# Patient Record
Sex: Female | Born: 1940 | Race: White | Hispanic: No | Marital: Married | Smoking: Never smoker
Health system: Southern US, Community
[De-identification: ages and names within clinical notes are randomized; demographics above are authoritative.]

## PROBLEM LIST (undated history)

## (undated) DIAGNOSIS — K589 Irritable bowel syndrome without diarrhea: Secondary | ICD-10-CM

## (undated) DIAGNOSIS — D689 Coagulation defect, unspecified: Secondary | ICD-10-CM

## (undated) DIAGNOSIS — I10 Essential (primary) hypertension: Secondary | ICD-10-CM

## (undated) DIAGNOSIS — E119 Type 2 diabetes mellitus without complications: Secondary | ICD-10-CM

## (undated) DIAGNOSIS — C801 Malignant (primary) neoplasm, unspecified: Secondary | ICD-10-CM

## (undated) DIAGNOSIS — T7840XA Allergy, unspecified, initial encounter: Secondary | ICD-10-CM

## (undated) DIAGNOSIS — F419 Anxiety disorder, unspecified: Secondary | ICD-10-CM

## (undated) DIAGNOSIS — M1990 Unspecified osteoarthritis, unspecified site: Secondary | ICD-10-CM

## (undated) HISTORY — DX: Anxiety disorder, unspecified: F41.9

## (undated) HISTORY — DX: Irritable bowel syndrome, unspecified: K58.9

## (undated) HISTORY — DX: Essential (primary) hypertension: I10

## (undated) HISTORY — DX: Coagulation defect, unspecified: D68.9

## (undated) HISTORY — DX: Type 2 diabetes mellitus without complications: E11.9

## (undated) HISTORY — PX: BREAST SURGERY: SHX581

## (undated) HISTORY — DX: Malignant (primary) neoplasm, unspecified: C80.1

## (undated) HISTORY — PX: EYE SURGERY: SHX253

## (undated) HISTORY — DX: Allergy, unspecified, initial encounter: T78.40XA

## (undated) HISTORY — DX: Unspecified osteoarthritis, unspecified site: M19.90

---

## 1983-07-11 HISTORY — PX: VAGINAL HYSTERECTOMY: SUR661

## 1994-07-10 HISTORY — PX: BREAST LUMPECTOMY: SHX2

## 1999-01-07 ENCOUNTER — Other Ambulatory Visit: Admission: RE | Admit: 1999-01-07 | Discharge: 1999-01-07 | Payer: Self-pay | Admitting: *Deleted

## 2000-07-10 HISTORY — PX: MASTECTOMY: SHX3

## 2001-01-02 ENCOUNTER — Encounter: Payer: Self-pay | Admitting: *Deleted

## 2001-01-02 ENCOUNTER — Encounter: Admission: RE | Admit: 2001-01-02 | Discharge: 2001-01-02 | Payer: Self-pay | Admitting: *Deleted

## 2001-04-30 ENCOUNTER — Other Ambulatory Visit: Admission: RE | Admit: 2001-04-30 | Discharge: 2001-04-30 | Payer: Self-pay | Admitting: Radiology

## 2001-05-06 ENCOUNTER — Ambulatory Visit (HOSPITAL_COMMUNITY): Admission: RE | Admit: 2001-05-06 | Discharge: 2001-05-06 | Payer: Self-pay | Admitting: General Surgery

## 2001-05-08 ENCOUNTER — Encounter (INDEPENDENT_AMBULATORY_CARE_PROVIDER_SITE_OTHER): Payer: Self-pay | Admitting: Specialist

## 2001-05-09 ENCOUNTER — Inpatient Hospital Stay (HOSPITAL_COMMUNITY): Admission: EM | Admit: 2001-05-09 | Discharge: 2001-05-10 | Payer: Self-pay | Admitting: General Surgery

## 2002-03-17 ENCOUNTER — Ambulatory Visit (HOSPITAL_COMMUNITY): Admission: RE | Admit: 2002-03-17 | Discharge: 2002-03-17 | Payer: Self-pay | Admitting: Gastroenterology

## 2002-03-17 ENCOUNTER — Encounter (INDEPENDENT_AMBULATORY_CARE_PROVIDER_SITE_OTHER): Payer: Self-pay

## 2003-03-23 ENCOUNTER — Ambulatory Visit (HOSPITAL_COMMUNITY): Admission: RE | Admit: 2003-03-23 | Discharge: 2003-03-23 | Payer: Self-pay | Admitting: Gastroenterology

## 2003-04-09 ENCOUNTER — Other Ambulatory Visit: Admission: RE | Admit: 2003-04-09 | Discharge: 2003-04-09 | Payer: Self-pay | Admitting: *Deleted

## 2003-12-30 ENCOUNTER — Other Ambulatory Visit: Admission: RE | Admit: 2003-12-30 | Discharge: 2003-12-30 | Payer: Self-pay | Admitting: Family Medicine

## 2007-07-19 ENCOUNTER — Ambulatory Visit: Payer: Self-pay | Admitting: Cardiology

## 2010-05-18 ENCOUNTER — Encounter: Admission: RE | Admit: 2010-05-18 | Discharge: 2010-05-18 | Payer: Self-pay | Admitting: Family Medicine

## 2010-11-22 NOTE — Letter (Signed)
July 19, 2007    Talmadge Coventry, M.D.  [address]  [place], [place] [address]   RE:  [name] [name], [name] [name]  MRN:  [id number]  /  DOB:  06/09/1941   Dear Dr. Smith Mince:   Thank you for your referral of Ms. Gilardi.  As you know, she is a  pleasant 70 year old woman with a history of hypertension, breast cancer  status post bilateral mastectomies, and a fairly longstanding but  intermittent history of palpitations.  She states that she saw Dr.  Juanda Chance back in her 67s and had an echocardiogram suggesting possibly  mitral valve prolapse although it sounds as that this was a fairly  equivocal diagnosis.  She has been treated with beta blockers since that  time with intermittent palpitations, although never any prolonged  symptoms or syncope.  She states that she is heterozygous for Factor V  Leiden deficiency has had no obvious thromboembolic complications.  She  carries a diagnosis of premature atrial complexes, I presume based on  standard electrocardiograms, as she has not had any prior cardiac  telemetry monitoring.  She reports since December having an increased  feeling of chest flutters, some irregularity to her heart beat, and  just a general sense at times that she is aware of her heartbeat.  She  has had her Toprol XL advanced now up to 200 mg daily and reports that  her symptoms have completely resolved.  She is not having any chest pain  or breathlessness.   MS. Massey REPORTS AN ALLERGY TO SULFA DRUGS.   She is taking presently:  1. Azithromycin.  2. Toprol XL 10 mg daily.  3. Maxzide.  4. BuSpar 30 mg p.o. b.i.d.  5. Allegra.  6. Allergy shots.  7. Restasis.  8. Estrace vaginal cream p.r.n.  9. Diazepam 2.5 mg daily.   PAST MEDICAL HISTORY:  Is discussed above.  The patient underwent a  lumpectomy with diagnosis of breast cancer in 1996 followed by bilateral  mastectomies in 2002.  Also had a prior hysterectomy in 1984.  No  personal history of  cardiovascular disease or myocardial infarction.   Ms. Scollard is married.  She has two children.  She is retired.  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  She  denies any tobacco use.  Drinks alcohol rarely.  She has been doing some  walking for exercise.  Drinks occasional caffeinated beverage.   REVIEW OF SYSTEMS:  Is as described above.  She does have seasonal  allergies.  Reportedly has a hiatal hernia.  Does have some arthritic  pain occasionally, mild anxiety at times, previous history of cancer.  Otherwise negative.   FAMILY HISTORY:  Was reviewed.  The patient stated that her son is  homozygous for Factor V Leiden deficiency.  He also has a patent  foraminal valley.  The patient stated her father died at age 34, having  developed heart disease in his 5s.  Her mother died at age 35, also  with heart disease.   EXAMINATION:  Ms. Zaragoza is in no acute distress, weighing 180  pounds, blood pressure 138/84, heart rate 90-100 and regular at rest.  HEENT:  Conjunctiva lids are normal.  Oropharynx is clear.  NECK:  Is supple.  No elevated jugular venous pressure.  No loud bruits.  No thyromegaly is noted.  LUNGS:  Are clear without labored breathing.  CARDIAC EXAM:  Reveals a regular rate rhythm with soft S4.  No loud  murmur.  Second heart sound is not split.  No pericardial rub.  ABDOMEN:  Soft, nontender, normoactive bowel sounds.  EXTREMITIES:  Exhibit no pitting edema.  Distal pulses are 2+.  SKIN:  Warm and dry.  MUSCULOSKELETAL:  No kyphosis noted.  NEURO/PSYCHIATRIC:  Patient alert and oriented x3.  Affect is normal.   Today's electrocardiogram shows normal sinus rhythm at 94 beats per  minute with normal intervals.   IMPRESSION AND RECOMMENDATIONS:  Ms. Buege is a pleasant 70 year old  woman with an intermittent yet longstanding history of palpitations  described as a fluttering and also irregularity with general awareness  of her  heartbeat.  She has had no frank dizziness or syncope associated  with this and does not describing any exertional chest pain or  breathlessness.  She has apparently had premature atrial complexes  already documented.  Her electrocardiogram today is normal.  She reports  resolution of the symptoms after increasing Toprol XL to 200 mg daily.  We talked about a number of options today and have elected simple  observation at this point.  If she manifests progressive symptoms,  however, I would suggest an echocardiogram with bubble study to exclude  any structural defects and also an event recorder to better document her  palpitations.  She was in agreement with this, assuming her symptoms  progressed, and otherwise, I will plan to see her back for a follow-up  in 6 months.    Sincerely,      Jonelle Sidle, MD  Electronically Signed    SGM/MedQ  DD: 07/19/2007  DT: 07/19/2007  Job #: [id number]

## 2010-11-25 NOTE — Op Note (Signed)
   [name] [name], [name] [name]                      ACCOUNT NO.:  [id number]   MEDICAL RECORD NO.:  [id number]                   PATIENT TYPE:  AMB   LOCATION:  ENDO                                 FACILITY:  [facility]   PHYSICIAN:  Barrie Folk, M.D.                  DATE OF BIRTH:  [birth year]   DATE OF PROCEDURE:  DATE OF DISCHARGE:                                 OPERATIVE REPORT   PROCEDURE:  Colonoscopy with polypectomy.   INDICATIONS FOR PROCEDURE:  Screening colonoscopy in a patient with a  personal history of breast cancer.   DESCRIPTION OF PROCEDURE:  The patient was placed in the left lateral  decubitus position then placed on the pulse monitor with continuous low flow  oxygen delivered by nasal cannula. She was sedated with 70 mg IV Demerol and  7 mg IV Versed. The Olympus video colonoscope was inserted into the rectum  and advanced to the cecum, confirmed by transillumination at McBurney's  point and visualization of the ileocecal valve and appendiceal orifice. The  prep was excellent. The cecum revealed a small sessile polyp approximately 7  mm in diameter and this was fulgurated by hot biopsy. The remainder of the  cecum, ascending, and transverse colon appeared normal with no further  polyps, masses, diverticula or other mucosal abnormalities. Within the  sigmoid colon there were seen several diverticula and no other  abnormalities. The rectum  appeared normal and retroflexed view of the anus  revealed no obvious internal hemorrhoids. The colonoscope was then withdrawn  and the patient returned to the recovery room in stable condition. The  patient tolerated the procedure well and there were no immediate  complications.   IMPRESSION:  1. Cecal polyp.  2. Sigmoid diverticulosis.   PLAN:  Await histology for determination of method and interval for future  colon screening.                                               Barrie Folk, M.D.    JCH/MEDQ  D:  03/17/2002   T:  03/17/2002  Job:  [id number]   cc:   Lynett Fish, M.D.  [address] [place]  [place] [address]  Fax: [phone]

## 2010-11-25 NOTE — Op Note (Signed)
[place]  Patient:    [name] [name], [name] [name] Visit Number: [id number] MRN: [id number]          Service Type: SUR Location: 4W 0448 01 Attending Physician:  Brandy Hale Dictated by:   Angelia Mould. Derrell Lolling, M.D. Proc. Date: 05/09/01 Admit Date:  05/09/2001   CC:         Valentino Hue. Magrinat, M.D.  Lynett Fish, M.D.  Jeralyn Ruths, M.D.  Heather Roberts, M.D.   Operative Report  PREOPERATIVE DIAGNOSIS:  Recurrent carcinoma of left breast.  POSTOPERATIVE DIAGNOSIS:  Recurrent carcinoma of left breast.  OPERATION PERFORMED: 1. Bilateral total mastectomy. 2. Injection of blue dye, left breast with attempted sentinel node mapping.  SURGEON:  Angelia Mould. Derrell Lolling, M.D.  ASSISTANT:  Lorne Skeens. Hoxworth, M.D.  ANESTHESIA:  INDICATIONS FOR PROCEDURE:  The patient is a 70 year old white female who underwent left partial mastectomy and left axillary lymph node dissection by me in 1996.  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  The tumor was 2.0 cm in diameter.  Lymph nodes were negative.  She received radiation therapy and Tamoxifen for five years and she did well.  Recent mammogram showed a new 8 mm punctate cluster of linear calcifications in the upper outer quadrant of the left breast which is in the same quadrant as the lumpectomy scar but is distinctly separate from the lumpectomy scar.  Dr. Jeralyn Ruths performed a stereotactic core biopsy on April 30, 2001.  This showed invasive mammary carcinoma and ductal carcinoma in situ.  Hormone receptors had been requested. The patient was seen by me on May 02, 2001.  Left mastectomy was recommended.  It was her strong desire to have a prophylactic right mastectomy.  Her care was discussed with Dr. Marikay Alar Magrinat and Dr. Margo Common. Dr. Cleone Slim requested that we attempt a sentinel node mapping and biopsy  on the left side which I agreed to do although I feel that the lymphatic branch has probably been disrupted.  We discussed immediate reconstruction and the patient did not want to consider that at this time.  She was brought to the operating room electively.  DESCRIPTION OF PROCEDURE:  The patient underwent injection of radioactive nuclide in the left breast two hours preop.  She was then brought to the operating room.  General endotracheal anesthesia was induced.  Both breasts, chest wall, axilla and shoulders were prepped and draped in sterile fashion. Lymphazurin blue dye was injected in periareolar tissues on the left in four quadrants and was massaged for three to five minutes.  The Neoprobe was used and the lateral breast and the axilla were completely cold. We did not find any area of increased activity.  The left mastectomy was performed first.  A transverse elliptical incision was made to incorporate the nipple and areola and the transverse scar laterally in the left breast where she had a previous lumpectomy.  Skin flap were raised superiorly, medially, inferiorly and laterally and all the breast tissue was dissected away from the underlying subcutaneous fat.  The breast tissue and the pectoralis fascia were then dissected away from the pectoralis major muscle, leaving the entire pectoralis muscle intact.  We took the specimen as far laterally as the latissimus dorsi muscle.  We again used the Neoprobe and did not find any area of increased activity in the lateral aspect of the breast tissue, nor did we find any  evidence of any radioactivity in the axilla.  This specimen was sent to pathology and the prior history was discussed with Dr. Francoise Schaumann in the pathology department.  The wound was irrigated with saline.  Hemostasis was excellent and achieved with electrocautery.  Two 19 French Blake drains were placed in the wound, one up into the axilla and one across the skin flaps.   These were brought out through separate incisions inferolaterally.  The drains were sutured to the skin and connected to suction bulbs.  The skin was closed with skin staples.  Attention was then directed to the right breast.  We made a very conservative transverse elliptical incision which completely excised the nipple and areola. Skin flap were again raised medially, superiorly, laterally and inferiorly, dissecting all of the breast tissue away from the subcutaneous fat.  The breast and the underlying pectoralis fascia were dissected away from the pectoralis major muscle leaving the pectoralis major muscle intact.  We took the entire breast and the tail of Spence away.  Probably a few lymph nodes were involved in the lateral aspect of the specimen but not many.  This specimen was sent for routine histology.  The history was discussed again with Dr. Francoise Schaumann.  This wound was also irrigated with saline.  Hemostasis was excellent and achieved with electrocautery.  Two 19 French Blake drains were placed also, one laterally and one medially and these were brought out through separate stab incisions inferiorly, sutured to the skin with silk sutures, connected to suction bulbs.  The skin was closed with skin staples.  Both wounds were cleansed and dried, covered with Adaptic bandage, 4 x 4, bulky bandage and HypaFix tape.  The patient tolerated the procedure well and was taken to the recovery room in stable condition.  Estimated blood loss was about 300 cc.  Complications were none.  Sponge, needle and instrument counts were correct. Dictated by:   Angelia Mould. Derrell Lolling, M.D. Attending Physician:  Brandy Hale DD:  05/09/01 TD:  05/09/01 Job: [id number]

## 2010-11-25 NOTE — Op Note (Signed)
   NAME:  [name] [name], [name] [name]                       ACCOUNT NO.:  [id number]   MEDICAL RECORD NO.:  [id number]                   PATIENT TYPE:  AMB   LOCATION:  ENDO                                 FACILITY:  [facility]   PHYSICIAN:  John C. Madilyn Fireman, M.D.                 DATE OF BIRTH:  07-03-1941   DATE OF PROCEDURE:  03/23/2003  DATE OF DISCHARGE:                                 OPERATIVE REPORT   PROCEDURE:  Colonoscopy.   INDICATIONS FOR PROCEDURE:  Heme positive stool and history of adenomatous  colon polyps.   DESCRIPTION OF PROCEDURE:  The patient was placed in the left lateral  decubitus position then placed on the pulse monitor with continuous low flow  oxygen delivered by nasal cannula. She was sedated with 100 mcg IV fentanyl  and 10 mg IV Versed. The Olympus video colonoscope was inserted into the  rectum and advanced to the cecum, confirmed by transillumination at  McBurney's point and visualization of the ileocecal valve and appendiceal  orifice. The prep was excellent. The cecum, ascending, transverse and  descending colon all appeared normal with no masses, polyps, diverticula or  other mucosal abnormalities. Within the sigmoid colon, there were still a  few scattered diverticula and no other abnormalities.  The rectum appeared  normal and retroflexed view of the anus revealed no obvious internal  hemorrhoids. The scope was then withdrawn and the patient returned to the  recovery room in stable condition. She tolerated the procedure well and  there were no immediate complications.   IMPRESSION:  Sigmoid diverticulosis otherwise normal study.   PLAN:  Repeat colonoscopy in five years based on her previous polyp.                                               John C. Madilyn Fireman, M.D.    JCH/MEDQ  D:  03/23/2003  T:  03/23/2003  Job:  [id number]   cc:   Dr. ______________________

## 2010-11-25 NOTE — Discharge Summary (Signed)
[facility]  Patient:    [name] [name], [name] [name] Visit Number: [id number] MRN: [id number]          Service Type: SUR Location: 4W 0448 01 Attending Physician:  Brandy Hale Dictated by:   Angelia Mould. Derrell Lolling, M.D. Admit Date:  05/09/2001 Discharge Date: 05/10/2001   CC:         Lynett Fish, M.D.  Valentino Hue. Magrinat, M.D.  Heather Roberts, M.D.   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  OPERATIONS PERFORMED:  Bilateral total mastectomy, injection of blue dye left breast, attempted left sentinel lymph node mapping and biopsy.  DATE OF SURGERY:  05/08/01.  HISTORY OF PRESENT ILLNESS:  This is a 70 year old white female who underwent left partial mastectomy, left axillary lymph node dissection in 1996.  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  Her posterior margin was involved and had to be re-resected, and we got a clean margin.  She had 16 lymph nodes, all of which were negative for cancer.  She received radiation therapy and tamoxifen for 5 years, and did well.  Recent mammogram showed a new, 8 mm punctate cluster of lineal calcifications in the upper outer quadrant of the left breast.  This is separate from the lumpectomy scar, but is in the same quadrant.  Jeralyn Ruths, M.D. performed a stereotactic core biopsy on April 30, 2001, and the pathology showed invasive mammary carcinoma and ductal carcinoma in situ.  Hormone receptors were sent, and although that report is not on the chart, I believe that her estrogen receptor was positive. The patient was evaluated as an outpatient.  Mammograms of the right breast were normal.  Examination of both breasts failed to reveal any palpable mass, skin change, or axillary mass.  The patient underwent lengthy discussions with me, as well as with Dr. Margo Common  preoperatively.  It was her strong desire to have bilateral mastectomy, and she was not interested in any reconstruction. Dr. Cleone Slim asked that we attempt a sentinel node biopsy on the left.  She was admitted for that procedure.  HOSPITAL COURSE:  On the day of admission the patient had her breast injected with radionuclide, and was brought to the operating room.  She underwent bilateral total mastectomy.  We injected blue dye into the left breast and attempted sentinel node mapping on the left side, but there was no radioactivity in the left axilla at any point, and so we did not identify any sentinel node.  Bilateral mastectomy was performed uneventfully.  Postoperatively, the patient did well.  She had mild to moderate drainage from her drains.  Her wounds looked good.  She felt enough to go home on postoperative day #2, May 10, 2001.  At that time she was ambulatory, tolerating her diet, her pain control was very good, her mastectomy wounds looked good, the dressings were clean, dry, and flat.  She was taught how to care for the Jackson-Pratt drains.  She was asked to return to see me in the office in 3 to 4 days.  She was given oxycodone and Advil for pain. Dictated by:   Angelia Mould. Derrell Lolling, M.D. Attending Physician:  Brandy Hale DD:  05/29/01 TD:  05/29/01 Job: [id number]

## 2010-12-09 ENCOUNTER — Encounter (INDEPENDENT_AMBULATORY_CARE_PROVIDER_SITE_OTHER): Payer: Self-pay | Admitting: General Surgery

## 2011-04-14 LAB — HM COLONOSCOPY

## 2011-07-13 LAB — HM DEXA SCAN

## 2011-09-01 ENCOUNTER — Other Ambulatory Visit: Payer: Self-pay | Admitting: Family Medicine

## 2011-09-03 ENCOUNTER — Other Ambulatory Visit: Payer: Self-pay | Admitting: Family Medicine

## 2011-09-07 ENCOUNTER — Telehealth: Payer: Self-pay

## 2011-09-07 NOTE — Telephone Encounter (Signed)
.  [facility] PT TO HAVE AN APPT WITH DR Hal Hope ON THE 4TH,  WAS GIVEN A CARD SAYING SHE IS TO HAVE BLOOD WORK DONE. DO SHE HAVE TO FAST PLEASE CALL [phone]

## 2011-09-07 NOTE — Telephone Encounter (Signed)
Advised pt that it would probably be best for her to be fasting in case the doctor wanted to check her cholesterol.

## 2011-09-09 ENCOUNTER — Encounter: Payer: Self-pay | Admitting: Family Medicine

## 2011-09-11 ENCOUNTER — Encounter: Payer: Self-pay | Admitting: Family Medicine

## 2011-09-11 ENCOUNTER — Ambulatory Visit (INDEPENDENT_AMBULATORY_CARE_PROVIDER_SITE_OTHER): Payer: Medicare Other | Admitting: Family Medicine

## 2011-09-11 DIAGNOSIS — I10 Essential (primary) hypertension: Secondary | ICD-10-CM

## 2011-09-11 DIAGNOSIS — M1990 Unspecified osteoarthritis, unspecified site: Secondary | ICD-10-CM

## 2011-09-11 DIAGNOSIS — E119 Type 2 diabetes mellitus without complications: Secondary | ICD-10-CM

## 2011-09-11 DIAGNOSIS — N189 Chronic kidney disease, unspecified: Secondary | ICD-10-CM

## 2011-09-11 DIAGNOSIS — M5136 Other intervertebral disc degeneration, lumbar region: Secondary | ICD-10-CM

## 2011-09-11 DIAGNOSIS — D6851 Activated protein C resistance: Secondary | ICD-10-CM

## 2011-09-11 DIAGNOSIS — M545 Low back pain, unspecified: Secondary | ICD-10-CM

## 2011-09-11 DIAGNOSIS — M8580 Other specified disorders of bone density and structure, unspecified site: Secondary | ICD-10-CM

## 2011-09-11 DIAGNOSIS — F419 Anxiety disorder, unspecified: Secondary | ICD-10-CM

## 2011-09-11 DIAGNOSIS — J301 Allergic rhinitis due to pollen: Secondary | ICD-10-CM

## 2011-09-11 DIAGNOSIS — E785 Hyperlipidemia, unspecified: Secondary | ICD-10-CM

## 2011-09-11 DIAGNOSIS — J302 Other seasonal allergic rhinitis: Secondary | ICD-10-CM

## 2011-09-11 DIAGNOSIS — Z86010 Personal history of colonic polyps: Secondary | ICD-10-CM

## 2011-09-11 DIAGNOSIS — C50919 Malignant neoplasm of unspecified site of unspecified female breast: Secondary | ICD-10-CM

## 2011-09-11 DIAGNOSIS — K219 Gastro-esophageal reflux disease without esophagitis: Secondary | ICD-10-CM

## 2011-09-11 LAB — POCT GLYCOSYLATED HEMOGLOBIN (HGB A1C): Hemoglobin A1C: 5.7

## 2011-09-11 MED ORDER — METOPROLOL SUCCINATE ER 100 MG PO TB24: ORAL_TABLET | ORAL | Status: DC

## 2011-09-11 MED ORDER — METFORMIN HCL ER 500 MG PO TB24: 500.0000 mg | ORAL_TABLET | Freq: Every day | ORAL | Status: DC

## 2011-09-11 MED ORDER — MONTELUKAST SODIUM 10 MG PO TABS: 10.0000 mg | ORAL_TABLET | Freq: Every day | ORAL | Status: DC

## 2011-09-11 NOTE — Progress Notes (Signed)
  Subjective:    Patient ID: [name] [name], female    DOB: 11-May-1941, 71 y.o.   MRN: [id number]  HPI  Patient presents in follow up of establishing care at our practice 12/12/10. Prior primary care physician was Dr. Maryruth Hancock Mazzocchi.  She is requesting medication refills and to share several concerns.  Patient expressed concern that her most recent medication refill request was declined. Secondary to multiple abnormal labs at her initial OV I asked the patient to return in 6 weeks. The importance of such follow up was discussed over an extended telephone conversation 12/27/10.  Second she prefers to have her labs drawn several days ahead of her scheduled OV so that her OV would be more productive.  Patient complains of allergic symptoms.  She is followed by Dr. Corinda Gubler. Currently receiving allergy immunotherapy, using nasal steroid spray, and non sedating anti histamine.  Complains of arthritis in the lower back.  Progressive symptoms that many days limit activity. Pain worse after prolong immobility and early am.  "I want to manage it with the least toxic pain killers". Currently using tylenol and one to two baby asprin a day. She states her chiropractor has tried several adjustments with only modest relief of her symptoms. She discussed the use of glucosamine with Dr. Corinda Gubler and has written to Darci Needle from the [facility] to discuss the use of tumeric.  At the end of our visit she requests refill of Boric Acid suppositories that are prescribed by Dr. Juliene Pina  PMH/ Please see problem list  Review of Systems     Objective:   Physical Exam  Constitutional: She appears well-developed and well-nourished.  HENT:  Head: Normocephalic and atraumatic.  Eyes: EOM are normal.  Neck: Neck supple. Carotid bruit is not present. No thyromegaly present.  Cardiovascular: Normal rate, regular rhythm and normal heart sounds.   Pulmonary/Chest: Effort normal and breath sounds normal.    Abdominal: Soft. Bowel sounds are normal. There is no tenderness.  Musculoskeletal: Normal range of motion. She exhibits no edema.  Neurological: She is alert.  Skin: Skin is warm.      Results for orders placed in visit on 09/11/11  POCT GLYCOSYLATED HEMOGLOBIN (HGB A1C)      Component Value Range   Hemoglobin A1C 5.7         Assessment & Plan:   1. HTN (hypertension)  metoprolol succinate (TOPROL XL) 100 MG 24 hr tablet  2. DM (diabetes mellitus)  POCT glycosylated hemoglobin (Hb A1C), Lipid panel, Comprehensive metabolic panel, metFORMIN (GLUCOPHAGE XR) 500 MG 24 hr tablet  3. DDD (degenerative disc disease), lumbar    4. Seasonal allergies  montelukast (SINGULAIR) 10 MG tablet  5. Anxiety    6. Breast cancer    7. GERD (gastroesophageal reflux disease)    8. Dyslipidemia    9. Osteopenia    10. DJD (degenerative joint disease)    11. Factor 5 Leiden mutation, heterozygous    12. CRI (chronic renal insufficiency)    13. Personal history of colonic polyps     Patient reassured that use of glucosamine and tumeric would have no deleterious effect.  As well I encouraged the use of OTC Naprosyn that patient has at home and the importance of daily.  Patient does not enjoy swimming so I encouraged modified yoga or other silver sneaker programs to maintain strength and mobility.  OV > 30 minutes

## 2011-09-12 DIAGNOSIS — F419 Anxiety disorder, unspecified: Secondary | ICD-10-CM | POA: Insufficient documentation

## 2011-09-12 DIAGNOSIS — C50919 Malignant neoplasm of unspecified site of unspecified female breast: Secondary | ICD-10-CM | POA: Insufficient documentation

## 2011-09-12 DIAGNOSIS — E119 Type 2 diabetes mellitus without complications: Secondary | ICD-10-CM | POA: Insufficient documentation

## 2011-09-12 DIAGNOSIS — K219 Gastro-esophageal reflux disease without esophagitis: Secondary | ICD-10-CM | POA: Insufficient documentation

## 2011-09-12 DIAGNOSIS — J302 Other seasonal allergic rhinitis: Secondary | ICD-10-CM | POA: Insufficient documentation

## 2011-09-12 DIAGNOSIS — I10 Essential (primary) hypertension: Secondary | ICD-10-CM | POA: Insufficient documentation

## 2011-09-12 DIAGNOSIS — M8580 Other specified disorders of bone density and structure, unspecified site: Secondary | ICD-10-CM | POA: Insufficient documentation

## 2011-09-12 DIAGNOSIS — D6851 Activated protein C resistance: Secondary | ICD-10-CM | POA: Insufficient documentation

## 2011-09-12 DIAGNOSIS — Z86010 Personal history of colonic polyps: Secondary | ICD-10-CM | POA: Insufficient documentation

## 2011-09-12 DIAGNOSIS — N189 Chronic kidney disease, unspecified: Secondary | ICD-10-CM | POA: Insufficient documentation

## 2011-09-12 DIAGNOSIS — M1990 Unspecified osteoarthritis, unspecified site: Secondary | ICD-10-CM | POA: Insufficient documentation

## 2011-09-12 DIAGNOSIS — E785 Hyperlipidemia, unspecified: Secondary | ICD-10-CM | POA: Insufficient documentation

## 2011-09-12 LAB — COMPREHENSIVE METABOLIC PANEL
ALT: 22 U/L (ref 0–35)
AST: 21 U/L (ref 0–37)
Albumin: 4.7 g/dL (ref 3.5–5.2)
Alkaline Phosphatase: 64 U/L (ref 39–117)
BUN: 23 mg/dL (ref 6–23)
CO2: 25 mEq/L (ref 19–32)
Calcium: 9.8 mg/dL (ref 8.4–10.5)
Chloride: 102 mEq/L (ref 96–112)
Creat: 1.22 mg/dL — ABNORMAL HIGH (ref 0.50–1.10)
Glucose, Bld: 116 mg/dL — ABNORMAL HIGH (ref 70–99)
Potassium: 3.8 mEq/L (ref 3.5–5.3)
Sodium: 140 mEq/L (ref 135–145)
Total Bilirubin: 0.4 mg/dL (ref 0.3–1.2)
Total Protein: 6.9 g/dL (ref 6.0–8.3)

## 2011-09-12 LAB — LIPID PANEL
Cholesterol: 176 mg/dL (ref 0–200)
HDL: 39 mg/dL — ABNORMAL LOW (ref >39)
LDL Cholesterol: 70 mg/dL (ref 0–99)
Total CHOL/HDL Ratio: 4.5 Ratio
Triglycerides: 337 mg/dL — ABNORMAL HIGH (ref <150)
VLDL: 67 mg/dL — ABNORMAL HIGH (ref 0–40)

## 2011-09-15 ENCOUNTER — Other Ambulatory Visit: Payer: Self-pay | Admitting: Family Medicine

## 2011-09-15 DIAGNOSIS — I10 Essential (primary) hypertension: Secondary | ICD-10-CM

## 2011-09-15 MED ORDER — METOPROLOL SUCCINATE ER 100 MG PO TB24: ORAL_TABLET | ORAL | Status: DC

## 2011-10-04 ENCOUNTER — Other Ambulatory Visit: Payer: Self-pay | Admitting: Family Medicine

## 2011-10-04 DIAGNOSIS — N189 Chronic kidney disease, unspecified: Secondary | ICD-10-CM

## 2011-10-04 DIAGNOSIS — E785 Hyperlipidemia, unspecified: Secondary | ICD-10-CM

## 2011-10-04 MED ORDER — PRAVASTATIN SODIUM 20 MG PO TABS: 20.0000 mg | ORAL_TABLET | Freq: Every day | ORAL | Status: DC

## 2011-10-04 MED ORDER — LISINOPRIL 5 MG PO TABS: 5.0000 mg | ORAL_TABLET | Freq: Every day | ORAL | Status: DC

## 2011-10-04 NOTE — Progress Notes (Signed)
Discussed with patient labs from OV of 09/11/11- 15 minute phone conversation  Reviewed labs Patient will add low dose ace inhibitor to regimen to slow progression of renal disease Patient reluctant to alter Maxide dose but will cut back to 1/4 tablet twice daily  Will E Rx Pravachol however patient not yet comfortable filling this Rx.  She is aware of the elevated risk of CAD in diabetics and will continue to consider this medication.  She will continue fish oit and Niacin.  Follow up will be in 4-6 weeks to recheck BP.  She may RTC earlier for BMET with GFR so the results will be available at the time of the OV.

## 2011-10-16 ENCOUNTER — Telehealth: Payer: Self-pay

## 2011-10-16 DIAGNOSIS — E119 Type 2 diabetes mellitus without complications: Secondary | ICD-10-CM

## 2011-10-16 NOTE — Telephone Encounter (Signed)
LMOM to CB. Get details of medication and SEs pt is having.

## 2011-10-16 NOTE — Telephone Encounter (Signed)
Pt CB and reported that she tried Lisinopril as Rx'd for an entire wk to make sure SEs wouldn't get better. She stated that it seemed to be interacting with Metformin and making her hypoglycemic - she doesn't check her BSs, but felt just like hypoglycemia, she was weak/shakey. It would take a long time, but her Sxs would eventually respond to eating something. After a wk, she tried cutting the lisinopril in half and tried several more days. It was not quite as bad, but still had Sxs. She could not continue. Wants to ask Dr Hal Hope if there is another ACE inh that would not interact the same way with her Metformin? Pt is going OOT from this Thurs through Tues and doesn't want to start anything else new until she returns. She has not started taking pravastatin yet bc she wanted to start one at a time.

## 2011-10-16 NOTE — Telephone Encounter (Signed)
Pt would like a nurse or Dr Hal Hope to contact her she is having complications with rx she was given her body won't tolerate it and is asking what else would Dr Hal Hope recommend. [employer]

## 2011-10-18 MED ORDER — BENAZEPRIL HCL 5 MG PO TABS: 5.0000 mg | ORAL_TABLET | Freq: Every day | ORAL | Status: DC

## 2011-10-18 NOTE — Telephone Encounter (Signed)
Hypoglycemia typically not seen with either lisinopril or metformin.  OK for patient to try Lotensin 5 mg daily #30 However most helpful is 6 small meals a day minimizing carbohydrates.

## 2011-10-26 ENCOUNTER — Encounter (INDEPENDENT_AMBULATORY_CARE_PROVIDER_SITE_OTHER): Payer: Self-pay | Admitting: General Surgery

## 2011-10-31 ENCOUNTER — Ambulatory Visit (INDEPENDENT_AMBULATORY_CARE_PROVIDER_SITE_OTHER): Payer: Medicare Other | Admitting: General Surgery

## 2011-10-31 ENCOUNTER — Encounter (INDEPENDENT_AMBULATORY_CARE_PROVIDER_SITE_OTHER): Payer: Self-pay | Admitting: General Surgery

## 2011-10-31 VITALS — BP 128/78 | HR 72 | Temp 97.9°F | Resp 18 | Ht 64.0 in | Wt 183.0 lb

## 2011-10-31 DIAGNOSIS — C50919 Malignant neoplasm of unspecified site of unspecified female breast: Secondary | ICD-10-CM

## 2011-10-31 NOTE — Progress Notes (Signed)
Patient ID: [name] [name], female   DOB: 04-Nov-1940, 71 y.o.   MRN: [id number]  Chief Complaint  Patient presents with  . Breast Cancer Long Term Follow Up    HPI [name] [name] is a 71 y.o. female.  She returns for long-term followup of her breast cancer.  She developed left breast cancer in 1996 treated with lumpectomy and radiation therapy. She developed a recurrence the left breast which was a stage T1a and  She underwent a left total mastectomy and right prophylactic mastectomy in 2002. She has no known recurrence to date . She is followed by Margo Common and Dr. Nadyne Coombes at urgent medical care.  She has no complaints about her chest wall or her arms. She simply wants annual checkups to be sure that nothing is missed. Dr Hal Hope  treats her diabetes and hypertension, and they are apparently in good control. HPI  Past Medical History  Diagnosis Date  . Hypertension   . Arthritis   . IBS (irritable bowel syndrome)   . Allergy   . Clotting disorder   . Diabetes mellitus   . Cancer     breast and skin    Past Surgical History  Procedure Date  . Mastectomy 2002    Bilateral  . Breast lumpectomy 1996  . Abdominal hysterectomy 1985    Family History  Problem Relation Age of Onset  . Heart disease Brother   . Heart disease Mother   . Heart disease Father     Social History History  Substance Use Topics  . Smoking status: Never Smoker   . Smokeless tobacco: Never Used  . Alcohol Use: No    Allergies  Allergen Reactions  . Sulfa Drugs Cross Reactors Rash    All over the body    Current Outpatient Prescriptions  Medication Sig Dispense Refill  . fluticasone (FLONASE) 50 MCG/ACT nasal spray as needed.      . BUSPAR 30 MG tablet TAKE 1 TABLET TWICE DAILY.  180 each  0  . DIAZEPAM PO Take by mouth.        . Fexofenadine HCl (ALLEGRA PO) Take by mouth. Or will take the generic version of Claritin.      . metFORMIN (GLUCOPHAGE XR) 500 MG 24 hr tablet Take 1  tablet (500 mg total) by mouth daily with breakfast.  90 tablet  0  . metoprolol succinate (TOPROL XL) 100 MG 24 hr tablet Take one tablet twice daily  180 tablet  0  . montelukast (SINGULAIR) 10 MG tablet Take 1 tablet (10 mg total) by mouth at bedtime.  30 tablet  11  . pravastatin (PRAVACHOL) 20 MG tablet Take 1 tablet (20 mg total) by mouth daily.  30 tablet  1  . TRIAMTERENE PO Take by mouth.        . DISCONTD: Metoprolol Succinate (TOPROL XL PO) Take by mouth.          Review of Systems Review of Systems  Constitutional: Negative for fever, chills and unexpected weight change.  HENT: Negative for hearing loss, congestion, sore throat, trouble swallowing and voice change.   Eyes: Negative for visual disturbance.  Respiratory: Negative for cough and wheezing.   Cardiovascular: Negative for chest pain, palpitations and leg swelling.  Gastrointestinal: Negative for nausea, vomiting, abdominal pain, diarrhea, constipation, blood in stool, abdominal distention and anal bleeding.  Genitourinary: Negative for hematuria, vaginal bleeding and difficulty urinating.  Musculoskeletal: Negative for arthralgias.  Skin: Negative for rash and wound.  Neurological: Negative for seizures, syncope and headaches.  Hematological: Negative for adenopathy. Does not bruise/bleed easily.  Psychiatric/Behavioral: Negative for confusion.    Blood pressure 128/78, pulse 72, temperature 97.9 F (36.6 C), temperature source Temporal, resp. rate 18, height 5\' 4"  (1.626 m), weight 183 lb (83.008 kg).  Physical Exam Physical Exam  Constitutional: She is oriented to person, place, and time. She appears well-developed and well-nourished. No distress.  HENT:  Head: Normocephalic and atraumatic.  Nose: Nose normal.  Eyes: Conjunctivae and EOM are normal. Pupils are equal, round, and reactive to light. Left eye exhibits no discharge. No scleral icterus.  Neck: Neck supple. No JVD present. No tracheal deviation  present. No thyromegaly present.  Cardiovascular: Normal rate, regular rhythm, normal heart sounds and intact distal pulses.   No murmur heard. Pulmonary/Chest: Effort normal and breath sounds normal. No respiratory distress. She has no wheezes. She has no rales. She exhibits no tenderness.       Bilateral mastectomy scars are well healed. The right side is very clean and soft. On the left she has thinning of the soft tissue and complex scar and because of radiation and wound healing problems. Nevertheless both wounds are completely healed and there is no palpable nodules or ulceration. There is no axillary adenopathy. Range of motion of both shoulders 100%.  Musculoskeletal: She exhibits no edema and no tenderness.  Lymphadenopathy:    She has no cervical adenopathy.  Neurological: She is alert and oriented to person, place, and time.  Skin: Skin is warm. No rash noted. She is not diaphoretic. No erythema. No pallor.  Psychiatric: She has a normal mood and affect. Her behavior is normal. Judgment and thought content normal.    Data Reviewed   Assessment    Carcinoma of the left breast, stage TI A., status post left total mastectomy. No evidence of recurrence, 10.5 years postop  Related history carcinoma left breast, 1996, treated with lumpectomy and radiation therapy  Status post right prophylactic mastectomy.  There is no evidence of any recurrent cancer on exam.    Plan    At her request, I will see her in one year for physical exam.       Angelia Mould. Derrell Lolling, M.D., [facility] General and Minimally invasive Surgery Breast and Colorectal Surgery Office:   [phone] Pager:   [phone]  10/31/2011, 2:56 PM

## 2011-10-31 NOTE — Patient Instructions (Signed)
Your physical exam today shows no evidence of cancer.  I encourage you to stay physically active and exercisesregularly.  Return to see me in one year for a physical exam.

## 2011-11-23 ENCOUNTER — Other Ambulatory Visit: Payer: Self-pay | Admitting: Internal Medicine

## 2011-11-23 ENCOUNTER — Other Ambulatory Visit: Payer: Self-pay | Admitting: Physician Assistant

## 2011-11-23 ENCOUNTER — Other Ambulatory Visit: Payer: Self-pay | Admitting: Family Medicine

## 2011-12-21 ENCOUNTER — Ambulatory Visit (INDEPENDENT_AMBULATORY_CARE_PROVIDER_SITE_OTHER): Payer: Medicare Other | Admitting: Family Medicine

## 2011-12-21 ENCOUNTER — Encounter: Payer: Self-pay | Admitting: Family Medicine

## 2011-12-21 VITALS — BP 148/78 | HR 80 | Temp 97.9°F | Resp 18 | Ht 64.0 in | Wt 184.2 lb

## 2011-12-21 DIAGNOSIS — I10 Essential (primary) hypertension: Secondary | ICD-10-CM

## 2011-12-21 DIAGNOSIS — M79672 Pain in left foot: Secondary | ICD-10-CM

## 2011-12-21 DIAGNOSIS — E785 Hyperlipidemia, unspecified: Secondary | ICD-10-CM

## 2011-12-21 DIAGNOSIS — M79671 Pain in right foot: Secondary | ICD-10-CM

## 2011-12-21 DIAGNOSIS — F419 Anxiety disorder, unspecified: Secondary | ICD-10-CM

## 2011-12-21 DIAGNOSIS — G4700 Insomnia, unspecified: Secondary | ICD-10-CM

## 2011-12-21 DIAGNOSIS — R7989 Other specified abnormal findings of blood chemistry: Secondary | ICD-10-CM

## 2011-12-21 DIAGNOSIS — J302 Other seasonal allergic rhinitis: Secondary | ICD-10-CM

## 2011-12-21 DIAGNOSIS — R7302 Impaired glucose tolerance (oral): Secondary | ICD-10-CM

## 2011-12-21 LAB — COMPREHENSIVE METABOLIC PANEL
ALT: 21 U/L (ref 0–35)
AST: 18 U/L (ref 0–37)
Albumin: 4.1 g/dL (ref 3.5–5.2)
Alkaline Phosphatase: 58 U/L (ref 39–117)
BUN: 24 mg/dL — ABNORMAL HIGH (ref 6–23)
CO2: 24 mEq/L (ref 19–32)
Calcium: 9.4 mg/dL (ref 8.4–10.5)
Chloride: 107 mEq/L (ref 96–112)
Creat: 1.25 mg/dL — ABNORMAL HIGH (ref 0.50–1.10)
Glucose, Bld: 136 mg/dL — ABNORMAL HIGH (ref 70–99)
Potassium: 3.8 mEq/L (ref 3.5–5.3)
Sodium: 142 mEq/L (ref 135–145)
Total Bilirubin: 0.5 mg/dL (ref 0.3–1.2)
Total Protein: 6.3 g/dL (ref 6.0–8.3)

## 2011-12-21 LAB — GLUCOSE, POCT (MANUAL RESULT ENTRY): POC Glucose: 131 mg/dl — AB (ref 70–99)

## 2011-12-21 LAB — POCT GLYCOSYLATED HEMOGLOBIN (HGB A1C): Hemoglobin A1C: 5.9

## 2011-12-21 MED ORDER — DIAZEPAM 2 MG PO TABS: 2.0000 mg | ORAL_TABLET | Freq: Two times a day (BID) | ORAL | Status: AC | PRN

## 2011-12-21 MED ORDER — DIAZEPAM 5 MG PO TABS: 5.0000 mg | ORAL_TABLET | Freq: Every evening | ORAL | Status: AC | PRN

## 2011-12-21 MED ORDER — ZOLPIDEM TARTRATE 10 MG PO TABS: 10.0000 mg | ORAL_TABLET | Freq: Every evening | ORAL | Status: DC | PRN

## 2011-12-21 MED ORDER — FLUTICASONE PROPIONATE 50 MCG/ACT NA SUSP: 2.0000 | Freq: Every day | NASAL | Status: DC

## 2011-12-21 MED ORDER — MONTELUKAST SODIUM 10 MG PO TABS: 10.0000 mg | ORAL_TABLET | Freq: Every day | ORAL | Status: DC

## 2011-12-21 MED ORDER — METOPROLOL SUCCINATE ER 100 MG PO TB24: ORAL_TABLET | ORAL | Status: DC

## 2011-12-21 MED ORDER — LIDOCAINE 5 % EX PTCH: 1.0000 | MEDICATED_PATCH | CUTANEOUS | Status: AC

## 2011-12-21 NOTE — Progress Notes (Signed)
Symptoms:   Patient has been cared for by Dr. Hal Hope She has been recently started on metformin, lisinopril and pravachol She developed some symptoms of hypoglycemia on metformin, and leg swelling with the ACE inhibitors.  Objective: NAD  Results for orders placed in visit on 09/11/11  POCT GLYCOSYLATED HEMOGLOBIN (HGB A1C)      Component Value Range   Hemoglobin A1C 5.7    LIPID PANEL      Component Value Range   Cholesterol 176  0 - 200 mg/dL   Triglycerides 782 (*) <150 mg/dL   HDL 39 (*) >95 mg/dL   Total CHOL/HDL Ratio 4.5     VLDL 67 (*) 0 - 40 mg/dL   LDL Cholesterol 70  0 - 99 mg/dL  COMPREHENSIVE METABOLIC PANEL      Component Value Range   Sodium 140  135 - 145 mEq/L   Potassium 3.8  3.5 - 5.3 mEq/L   Chloride 102  96 - 112 mEq/L   CO2 25  19 - 32 mEq/L   Glucose, Bld 116 (*) 70 - 99 mg/dL   BUN 23  6 - 23 mg/dL   Creat 6.21 (*) 3.08 - 1.10 mg/dL   Total Bilirubin 0.4  0.3 - 1.2 mg/dL   Alkaline Phosphatase 64  39 - 117 U/L   AST 21  0 - 37 U/L   ALT 22  0 - 35 U/L   Total Protein 6.9  6.0 - 8.3 g/dL   Albumin 4.7  3.5 - 5.2 g/dL   Calcium 9.8  8.4 - 65.7 mg/dL   846/96 Heart:  Reg, no murmur Neck:  No bruit Chest:  Clear Ext:  No edema.  I spent over 30 minutes face to face with patient discussing her labs, her need to lose weight, and how she should be eating and exercising  Assessment:  Needs to lose weight and exercise regularly.  Her glucose levels have been low enough that, with some motivation to lose the extra pounds, she should be able to manage the hyperglycemia without the metformin.  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 Also, I think that taking the pravachol for hypertriglyceridemia is optional.  Since she wants to reduce her medications and focus on lifestyle change, stopping the pravachol seems reasonable.  Plan: stop  metformin and pravachol \\work  on diet rich green vegetables and try to exercise 30 minutes a day.  We talked about water aerobics since she has some low back pain chronically. Recheck 3 months.  Results for orders placed in visit on 12/21/11  GLUCOSE, POCT (MANUAL RESULT ENTRY)      Component Value Range   POC Glucose 131 (*) 70 - 99 mg/dl  POCT GLYCOSYLATED HEMOGLOBIN (HGB A1C)      Component Value Range   Hemoglobin A1C 5.9    COMPREHENSIVE METABOLIC PANEL      Component Value Range   Sodium 142  135 - 145 mEq/L   Potassium 3.8  3.5 - 5.3 mEq/L   Chloride 107  96 - 112 mEq/L   CO2 24  19 - 32 mEq/L   Glucose, Bld 136 (*) 70 - 99 mg/dL   BUN 24 (*) 6 - 23 mg/dL   Creat 2.95 (*) 2.84 - 1.10 mg/dL   Total Bilirubin 0.5  0.3 - 1.2 mg/dL   Alkaline Phosphatase 58  39 -  117 U/L   AST 18  0 - 37 U/L   ALT 21  0 - 35 U/L   Total Protein 6.3  6.0 - 8.3 g/dL   Albumin 4.1  3.5 - 5.2 g/dL   Calcium 9.4  8.4 - 82.5 mg/dL

## 2011-12-22 ENCOUNTER — Other Ambulatory Visit: Payer: Self-pay | Admitting: Family Medicine

## 2012-02-01 ENCOUNTER — Ambulatory Visit: Payer: Medicare Other | Admitting: Family Medicine

## 2012-02-03 ENCOUNTER — Other Ambulatory Visit: Payer: Self-pay | Admitting: *Deleted

## 2012-02-03 MED ORDER — TRIAMTERENE-HCTZ 75-50 MG PO TABS: ORAL_TABLET | ORAL | Status: DC

## 2012-03-12 ENCOUNTER — Other Ambulatory Visit (INDEPENDENT_AMBULATORY_CARE_PROVIDER_SITE_OTHER): Payer: Medicare Other | Admitting: Family Medicine

## 2012-03-12 DIAGNOSIS — E785 Hyperlipidemia, unspecified: Secondary | ICD-10-CM

## 2012-03-12 DIAGNOSIS — I10 Essential (primary) hypertension: Secondary | ICD-10-CM

## 2012-03-12 LAB — LIPID PANEL
Cholesterol: 185 mg/dL (ref 0–200)
HDL: 37 mg/dL — ABNORMAL LOW (ref >39)
LDL Cholesterol: 87 mg/dL (ref 0–99)
Total CHOL/HDL Ratio: 5 Ratio
Triglycerides: 304 mg/dL — ABNORMAL HIGH (ref <150)
VLDL: 61 mg/dL — ABNORMAL HIGH (ref 0–40)

## 2012-03-12 LAB — COMPREHENSIVE METABOLIC PANEL
ALT: 26 U/L (ref 0–35)
AST: 25 U/L (ref 0–37)
Albumin: 4.4 g/dL (ref 3.5–5.2)
Alkaline Phosphatase: 52 U/L (ref 39–117)
BUN: 25 mg/dL — ABNORMAL HIGH (ref 6–23)
CO2: 23 mEq/L (ref 19–32)
Calcium: 9.6 mg/dL (ref 8.4–10.5)
Chloride: 100 mEq/L (ref 96–112)
Creat: 1.25 mg/dL — ABNORMAL HIGH (ref 0.50–1.10)
Glucose, Bld: 144 mg/dL — ABNORMAL HIGH (ref 70–99)
Potassium: 3.7 mEq/L (ref 3.5–5.3)
Sodium: 135 mEq/L (ref 135–145)
Total Bilirubin: 0.6 mg/dL (ref 0.3–1.2)
Total Protein: 6.5 g/dL (ref 6.0–8.3)

## 2012-03-19 ENCOUNTER — Ambulatory Visit (INDEPENDENT_AMBULATORY_CARE_PROVIDER_SITE_OTHER): Payer: Medicare Other | Admitting: Family Medicine

## 2012-03-19 VITALS — BP 128/80 | HR 86 | Temp 98.5°F | Resp 16 | Ht 65.0 in | Wt 185.0 lb

## 2012-03-19 DIAGNOSIS — S81009A Unspecified open wound, unspecified knee, initial encounter: Secondary | ICD-10-CM

## 2012-03-19 DIAGNOSIS — S81809A Unspecified open wound, unspecified lower leg, initial encounter: Secondary | ICD-10-CM

## 2012-03-19 DIAGNOSIS — Z23 Encounter for immunization: Secondary | ICD-10-CM

## 2012-03-19 DIAGNOSIS — M25569 Pain in unspecified knee: Secondary | ICD-10-CM

## 2012-03-19 NOTE — Patient Instructions (Addendum)
Results for orders placed in visit on 03/12/12  COMPREHENSIVE METABOLIC PANEL      Component Value Range   Sodium 135  135 - 145 mEq/L   Potassium 3.7  3.5 - 5.3 mEq/L   Chloride 100  96 - 112 mEq/L   CO2 23  19 - 32 mEq/L   Glucose, Bld 144 (*) 70 - 99 mg/dL   BUN 25 (*) 6 - 23 mg/dL   Creat 6.96 (*) 2.95 - 1.10 mg/dL   Total Bilirubin 0.6  0.3 - 1.2 mg/dL   Alkaline Phosphatase 52  39 - 117 U/L   AST 25  0 - 37 U/L   ALT 26  0 - 35 U/L   Total Protein 6.5  6.0 - 8.3 g/dL   Albumin 4.4  3.5 - 5.2 g/dL   Calcium 9.6  8.4 - 28.4 mg/dL  LIPID PANEL      Component Value Range   Cholesterol 185  0 - 200 mg/dL   Triglycerides 132 (*) <150 mg/dL   HDL 37 (*) >44 mg/dL   Total CHOL/HDL Ratio 5.0     VLDL 61 (*) 0 - 40 mg/dL   LDL Cholesterol 87  0 - 99 mg/dL   WOUND CARE Please return in 10 days to have your stitches/staples removed or sooner if you have concerns. Marland Kitchen Keep area clean and dry for 24 hours. Do not remove bandage, if applied. . After 24 hours, remove bandage and wash wound gently with mild soap and warm water. Reapply a new bandage after cleaning wound, if directed. . Continue daily cleansing with soap and water until stitches/staples are removed. . Do not apply any ointments or creams to the wound while stitches/staples are in place, as this may cause delayed healing. . Notify the office if you experience any of the following signs of infection: Swelling, redness, pus drainage, streaking, fever >101.0 F . Notify the office if you experience excessive bleeding that does not stop after 15-20 minutes of constant, firm pressure.  Results for orders placed in visit on 03/12/12  COMPREHENSIVE METABOLIC PANEL      Component Value Range   Sodium 135  135 - 145 mEq/L   Potassium 3.7  3.5 - 5.3 mEq/L   Chloride 100  96 - 112 mEq/L   CO2 23  19 - 32 mEq/L   Glucose, Bld 144 (*) 70 - 99 mg/dL   BUN 25 (*) 6 - 23 mg/dL   Creat 0.10 (*) 2.72 - 1.10 mg/dL   Total  Bilirubin 0.6  0.3 - 1.2 mg/dL   Alkaline Phosphatase 52  39 - 117 U/L   AST 25  0 - 37 U/L   ALT 26  0 - 35 U/L   Total Protein 6.5  6.0 - 8.3 g/dL   Albumin 4.4  3.5 - 5.2 g/dL   Calcium 9.6  8.4 - 53.6 mg/dL  LIPID PANEL      Component Value Range   Cholesterol 185  0 - 200 mg/dL   Triglycerides 644 (*) <150 mg/dL   HDL 37 (*) >03 mg/dL   Total CHOL/HDL Ratio 5.0     VLDL 61 (*) 0 - 40 mg/dL   LDL Cholesterol 87  0 - 99 mg/dL

## 2012-03-19 NOTE — Progress Notes (Signed)
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  She stumbled on threshold and lacerated left knee.  Also bruised left elbow.  Also has some stiffness on right wrist.  Neither of the latter two areas have limited ROM or localized tenderness, though.  Objective:  NAD 3 cm laceration, angled, left knee.  Betadine prep  1% xylo with epi local   Thorough scrub and irrigation  4 x 3-0 horizontal laceration  Sterile dressing   Ecchymosis 1 cm left elbow with overlying 4 mm abrasion R wrist is nontender with FROM and no ecchymosis or bony abnormality  Results for orders placed in visit on 03/12/12  COMPREHENSIVE METABOLIC PANEL      Component Value Range   Sodium 135  135 - 145 mEq/L   Potassium 3.7  3.5 - 5.3 mEq/L   Chloride 100  96 - 112 mEq/L   CO2 23  19 - 32 mEq/L   Glucose, Bld 144 (*) 70 - 99 mg/dL   BUN 25 (*) 6 - 23 mg/dL   Creat 1.61 (*) 0.96 - 1.10 mg/dL   Total Bilirubin 0.6  0.3 - 1.2 mg/dL   Alkaline Phosphatase 52  39 - 117 U/L   AST 25  0 - 37 U/L   ALT 26  0 - 35 U/L   Total Protein 6.5  6.0 - 8.3 g/dL   Albumin 4.4  3.5 - 5.2 g/dL   Calcium 9.6  8.4 - 04.5 mg/dL  LIPID PANEL      Component Value Range   Cholesterol 185  0 - 200 mg/dL   Triglycerides 409 (*) <150 mg/dL   HDL 37 (*) >81 mg/dL   Total CHOL/HDL Ratio 5.0     VLDL 61 (*) 0 - 40 mg/dL   LDL Cholesterol 87  0 - 99 mg/dL   We reviewed the above labs. Flu shot

## 2012-03-21 ENCOUNTER — Ambulatory Visit: Payer: Medicare Other | Admitting: Family Medicine

## 2012-03-26 ENCOUNTER — Other Ambulatory Visit: Payer: Self-pay | Admitting: Family Medicine

## 2012-03-30 ENCOUNTER — Ambulatory Visit (INDEPENDENT_AMBULATORY_CARE_PROVIDER_SITE_OTHER): Payer: Medicare Other | Admitting: Physician Assistant

## 2012-03-30 ENCOUNTER — Telehealth: Payer: Self-pay | Admitting: Physician Assistant

## 2012-03-30 VITALS — BP 128/80 | HR 74 | Temp 97.9°F | Resp 16 | Ht 64.5 in | Wt 182.8 lb

## 2012-03-30 DIAGNOSIS — E119 Type 2 diabetes mellitus without complications: Secondary | ICD-10-CM

## 2012-03-30 DIAGNOSIS — E782 Mixed hyperlipidemia: Secondary | ICD-10-CM

## 2012-03-30 DIAGNOSIS — S81009A Unspecified open wound, unspecified knee, initial encounter: Secondary | ICD-10-CM

## 2012-03-30 NOTE — Progress Notes (Signed)
  Subjective:    Patient ID: [name] [name], female    DOB: 1940-11-13, 71 y.o.   MRN: [id number]  HPI  This 71 y.o. female presents for evaluation of a wound on the left knee, ready to have the sutures removed.  She's had a small amount of serous drainage, but is otherwise having no difficulty. The wound was closed 03/19/2012 by Dr. Milus Glazier..   Review of Systems No fever, chills, redness, increasing pain.    Objective:   Physical Exam VS noted.  WDWNWF, A&O x 3. Knee is remarkable for a well healed wound, with #4 horizontal mattress sutures in place.  No erythema, edema.  Non-tender. Sutures removed without incident.     Assessment & Plan:

## 2012-03-30 NOTE — Telephone Encounter (Signed)
Spoke with pt and gave information pertaining to following up in 6 months, explained she can come in ahead of time and have blood work done. Pt understood and will await call back from scheduling.  Sending to scheduling.

## 2012-03-30 NOTE — Telephone Encounter (Signed)
Please call patient.  I spoke with Dr. Milus Glazier, who wants to see her back in 6 months.  I've put in the orders for the labs to be done, so she can come in ahead of time, at her convenience.  Then, please route this message to the [facility] POOL so that they can call and schedule her for the 6 month follow-up at 104.  Thanks!

## 2012-04-02 NOTE — Telephone Encounter (Signed)
Six month f-up appt made for 09/19/12 with Dr. Elbert Ewings.

## 2012-04-19 ENCOUNTER — Ambulatory Visit (INDEPENDENT_AMBULATORY_CARE_PROVIDER_SITE_OTHER): Payer: Medicare Other | Admitting: Family Medicine

## 2012-04-19 VITALS — BP 122/77 | HR 80 | Temp 97.1°F | Resp 18 | Ht 64.5 in | Wt 184.0 lb

## 2012-04-19 DIAGNOSIS — R2689 Other abnormalities of gait and mobility: Secondary | ICD-10-CM

## 2012-04-19 DIAGNOSIS — H6120 Impacted cerumen, unspecified ear: Secondary | ICD-10-CM

## 2012-04-19 DIAGNOSIS — M25562 Pain in left knee: Secondary | ICD-10-CM

## 2012-04-19 DIAGNOSIS — M25569 Pain in unspecified knee: Secondary | ICD-10-CM

## 2012-04-19 DIAGNOSIS — R29818 Other symptoms and signs involving the nervous system: Secondary | ICD-10-CM

## 2012-04-19 DIAGNOSIS — E559 Vitamin D deficiency, unspecified: Secondary | ICD-10-CM

## 2012-04-19 DIAGNOSIS — E538 Deficiency of other specified B group vitamins: Secondary | ICD-10-CM

## 2012-04-19 DIAGNOSIS — E039 Hypothyroidism, unspecified: Secondary | ICD-10-CM

## 2012-04-19 DIAGNOSIS — R002 Palpitations: Secondary | ICD-10-CM

## 2012-04-19 MED ORDER — TOPROL XL 100 MG PO TB24: 100.0000 mg | ORAL_TABLET | Freq: Two times a day (BID) | ORAL | Status: DC

## 2012-04-19 NOTE — Progress Notes (Signed)
71 yo who 6 weeks ago cut her left knee.  It is continuing to be tender but she is walking well and the wound has healed.  She notes a tremor (coarse) when lifting objects with arms, never at rest.  6 weeks of balance problems. She thinks she may be dragging the left leg a little.  Objective:  NAD Ears: right cerumen impaction Oroph:  Normal Neck:  No bruits Heart: reg, no murmur Lungs:  Clear  Chest:  No nodules, inflammation or tenderness along the surgical mastectomy scar line. Neuro:   Normal KJ, diminished AJ  Positive Romberg  No arm drift  No tremor  Symmetric motor strength  Assessment:

## 2012-04-20 LAB — VITAMIN D 25 HYDROXY (VIT D DEFICIENCY, FRACTURES): Vit D, 25-Hydroxy: 93 ng/mL — ABNORMAL HIGH (ref 30–89)

## 2012-04-20 LAB — VITAMIN B12: Vitamin B-12: 1079 pg/mL — ABNORMAL HIGH (ref 211–911)

## 2012-04-20 LAB — TSH: TSH: 2.446 u[IU]/mL (ref 0.350–4.500)

## 2012-05-01 ENCOUNTER — Telehealth: Payer: Self-pay

## 2012-05-01 NOTE — Telephone Encounter (Signed)
She can call and be put on a CXL list. I asked her to do this. She advised I will call also, the number there is [phone].

## 2012-05-01 NOTE — Telephone Encounter (Signed)
Pt had and  appt to a neurologist in [place] on 05/15/12 and then they sent her a letter stating that the appt had to moved to 05/23/12.  Pt called stating that her symptoms has gotten worse and would like to know what to do next because she is not sure that she can wait til 05/23/12  Best number [phone]

## 2012-05-03 NOTE — Telephone Encounter (Signed)
Called neurologist and was advised that they do not have any appts available before 05/23/12 at this time, but they did put pt on the waiting list for cancellations.

## 2012-05-26 DIAGNOSIS — R269 Unspecified abnormalities of gait and mobility: Secondary | ICD-10-CM | POA: Insufficient documentation

## 2012-05-28 ENCOUNTER — Other Ambulatory Visit: Payer: Self-pay | Admitting: Physician Assistant

## 2012-06-07 ENCOUNTER — Telehealth: Payer: Self-pay

## 2012-06-07 DIAGNOSIS — R42 Dizziness and giddiness: Secondary | ICD-10-CM

## 2012-06-07 NOTE — Telephone Encounter (Signed)
I can call her, are you wanting her to go to PT for this? [facility] has a program?

## 2012-06-07 NOTE — Telephone Encounter (Signed)
Pt is wanting to talk with dr Milus Glazier about who he is wanting to send her to about ear cystals-to help with her balance  [phone] ok to leave a message

## 2012-06-10 NOTE — Telephone Encounter (Signed)
Dr L has referred to ENT. Advised her we will call with the appt. Patient request appt to be made with Dr Lovey Newcomer. Lupita Leash advised.

## 2012-06-12 ENCOUNTER — Ambulatory Visit (INDEPENDENT_AMBULATORY_CARE_PROVIDER_SITE_OTHER): Payer: Medicare Other | Admitting: Family Medicine

## 2012-06-12 ENCOUNTER — Ambulatory Visit: Payer: Medicare Other | Admitting: Physician Assistant

## 2012-06-12 ENCOUNTER — Other Ambulatory Visit (INDEPENDENT_AMBULATORY_CARE_PROVIDER_SITE_OTHER): Payer: Medicare Other | Admitting: Physician Assistant

## 2012-06-12 VITALS — BP 124/66 | HR 88 | Temp 97.7°F | Resp 16 | Ht 64.5 in | Wt 179.0 lb

## 2012-06-12 DIAGNOSIS — E119 Type 2 diabetes mellitus without complications: Secondary | ICD-10-CM

## 2012-06-12 DIAGNOSIS — R269 Unspecified abnormalities of gait and mobility: Secondary | ICD-10-CM

## 2012-06-12 LAB — POCT GLYCOSYLATED HEMOGLOBIN (HGB A1C): Hemoglobin A1C: 7.2

## 2012-06-12 NOTE — Progress Notes (Signed)
Patient here today to see Dr. Milus Glazier. Was told he would not be in until 4:00 p.m today - was upset because she was told he would be here at 10:00 a.m.  Refused to see another provider and states she wants to have her HgA1c drawn now by [facility] only.  Demanded a fast track card for later this afternoon to see Dr. Milus Glazier at 4:00 p.m.  Patient sent to 104 to have labs drawn.  Not seen by a provider.

## 2012-06-12 NOTE — Progress Notes (Addendum)
71 yo woman with intermittent vertigo.  She was evaluated by Dr. Modesto Charon at [facility].  A number of tests were run:  MR brain, TSH, CRP, sed rate,  vitamin B12, SPEP, and methylymalonic acid, chemistry panel.  She has a follow up appointment February 19th.   MR shows scattered areas of white matter. She's now had only two temporary episodes of vertigo, but her main complaint is "wobbly" legs and she tends to drift to the left after standing for awhile.  She is scheduled for a NCV. 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  Patient denies numbness.    Objective:  HgbA1C:  7.4 Results for orders placed in visit on 06/12/12  POCT GLYCOSYLATED HEMOGLOBIN (HGB A1C)      Component Value Range   Hemoglobin A1C 7.2    reflexes:  Normal AJ, KJ Moves 4 extremities equally and symmetrically Wide based gait No tremor No focal weakness CN III-XII intact   Assessment:  This unsteady gait disturbance goes undiagnosed.  She has had every reasonable blood test at this point.  I think physical therapy is a good place to start, and I will need to touch base with Dr. Modesto Charon about his referral to a "neuromuscular clinic" at [facility]. I do not think this has anything to do with her diabetes.  Plan:  Call Dr. Modesto Charon tomorrow Refer to physical therapy.

## 2012-06-13 ENCOUNTER — Telehealth: Payer: Self-pay | Admitting: Family Medicine

## 2012-06-14 NOTE — Telephone Encounter (Signed)
Patient will be scheduled for PT.  [facility] switchboard has no listing for gait clinic

## 2012-06-24 ENCOUNTER — Other Ambulatory Visit: Payer: Self-pay | Admitting: Physician Assistant

## 2012-07-22 ENCOUNTER — Telehealth: Payer: Self-pay

## 2012-07-22 NOTE — Telephone Encounter (Signed)
Pt is calling stating that she can get an appt with dr Pollyann Kennedy on 07/23/12 herself and needs [place] to send records to his office please call patient at [phone] When we have faxed records so patient can schedule the appt

## 2012-07-22 NOTE — Telephone Encounter (Signed)
Spoke to [place], we actually need a referral for this patient.

## 2012-07-22 NOTE — Telephone Encounter (Signed)
Can we go ahead and send in a referral for pt to see Dr Pollyann Kennedy for her imbalance/vertigo/gait problem?

## 2012-07-23 ENCOUNTER — Other Ambulatory Visit: Payer: Self-pay | Admitting: Physician Assistant

## 2012-08-13 ENCOUNTER — Ambulatory Visit (INDEPENDENT_AMBULATORY_CARE_PROVIDER_SITE_OTHER): Payer: Medicare Other | Admitting: Family Medicine

## 2012-08-13 VITALS — BP 122/79 | HR 65 | Temp 98.1°F | Resp 16 | Ht 64.5 in | Wt 179.8 lb

## 2012-08-13 DIAGNOSIS — R2681 Unsteadiness on feet: Secondary | ICD-10-CM

## 2012-08-13 DIAGNOSIS — R269 Unspecified abnormalities of gait and mobility: Secondary | ICD-10-CM

## 2012-08-13 NOTE — Progress Notes (Signed)
72 yo woman with intermittent vertigo. She was evaluated by Dr. Modesto Charon at [facility]. A number of tests were run: MR brain, TSH, CRP, sed rate, vitamin B12, SPEP, and methylymalonic acid, chemistry panel. She has a follow up appointment February 19th.  MR shows scattered areas of white matter.  She's now had only two temporary episodes of vertigo, but her main complaint is "wobbly" legs and she tends to drift to the left after standing for awhile.  She is scheduled for a NCV.  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  Patient denies numbness.   Has been receiving physical therapy for gait training.  She did cancel NCV because she has no neuropathy. Never was able to arrange an ENT visit for the vertigo.  The physical therapist helped with the Eppley maneuver.  Objective:  NAD CN III-XII intact Negative Romberg Negative drift Positive heel to toe instability gait (unsteady)  Assessment: mild improvement  Plan:  Continue PT twice a week indefinitely. Recheck balance in April with A1C

## 2012-09-03 ENCOUNTER — Other Ambulatory Visit: Payer: Self-pay | Admitting: Physician Assistant

## 2012-09-12 ENCOUNTER — Other Ambulatory Visit (INDEPENDENT_AMBULATORY_CARE_PROVIDER_SITE_OTHER): Payer: Self-pay | Admitting: *Deleted

## 2012-09-12 DIAGNOSIS — E119 Type 2 diabetes mellitus without complications: Secondary | ICD-10-CM

## 2012-09-12 DIAGNOSIS — E785 Hyperlipidemia, unspecified: Secondary | ICD-10-CM

## 2012-09-12 LAB — COMPREHENSIVE METABOLIC PANEL
ALT: 22 U/L (ref 0–35)
AST: 19 U/L (ref 0–37)
Albumin: 4.6 g/dL (ref 3.5–5.2)
Alkaline Phosphatase: 75 U/L (ref 39–117)
BUN: 28 mg/dL — ABNORMAL HIGH (ref 6–23)
CO2: 26 mEq/L (ref 19–32)
Calcium: 9.8 mg/dL (ref 8.4–10.5)
Chloride: 98 mEq/L (ref 96–112)
Creat: 1.22 mg/dL — ABNORMAL HIGH (ref 0.50–1.10)
Glucose, Bld: 163 mg/dL — ABNORMAL HIGH (ref 70–99)
Potassium: 3.6 mEq/L (ref 3.5–5.3)
Sodium: 138 mEq/L (ref 135–145)
Total Bilirubin: 0.5 mg/dL (ref 0.3–1.2)
Total Protein: 6.8 g/dL (ref 6.0–8.3)

## 2012-09-12 LAB — HEMOGLOBIN A1C
Hgb A1c MFr Bld: 7.1 % — ABNORMAL HIGH (ref <5.7)
Mean Plasma Glucose: 157 mg/dL — ABNORMAL HIGH (ref <117)

## 2012-09-12 LAB — LIPID PANEL
Cholesterol: 183 mg/dL (ref 0–200)
HDL: 40 mg/dL (ref >39)
LDL Cholesterol: 78 mg/dL (ref 0–99)
Total CHOL/HDL Ratio: 4.6 Ratio
Triglycerides: 325 mg/dL — ABNORMAL HIGH (ref <150)
VLDL: 65 mg/dL — ABNORMAL HIGH (ref 0–40)

## 2012-09-12 NOTE — Progress Notes (Signed)
Pt here for labs only. States Dr. Milus Glazier told her to come in orders would be placed. The only orders i found were a cmet and lipid standing order for q 6 mo from [facility], and a A1c recheck for April mentioned in dr lauenstein office visit note from 08/13/2012. I placed these three orders for pt.

## 2012-09-19 ENCOUNTER — Ambulatory Visit (INDEPENDENT_AMBULATORY_CARE_PROVIDER_SITE_OTHER): Payer: Medicare Other | Admitting: Family Medicine

## 2012-09-19 ENCOUNTER — Encounter: Payer: Self-pay | Admitting: Family Medicine

## 2012-09-19 VITALS — BP 112/76 | HR 74 | Temp 97.7°F | Resp 16 | Ht 64.75 in | Wt 178.0 lb

## 2012-09-19 DIAGNOSIS — E119 Type 2 diabetes mellitus without complications: Secondary | ICD-10-CM

## 2012-09-19 DIAGNOSIS — R269 Unspecified abnormalities of gait and mobility: Secondary | ICD-10-CM

## 2012-09-19 DIAGNOSIS — K589 Irritable bowel syndrome without diarrhea: Secondary | ICD-10-CM

## 2012-09-19 MED ORDER — RIFAXIMIN 550 MG PO TABS: 550.0000 mg | ORAL_TABLET | Freq: Two times a day (BID) | ORAL | Status: DC | PRN

## 2012-09-19 NOTE — Progress Notes (Signed)
72 yo woman with IBS who needs refill on med prescribed by Dr. Loreta Ave  She has tried a variety of OTC products and currently also takes probiotics.  The flares of IBS are less than once a month.  Symptoms worsen with stress, travel.   Symptoms are diarrhea and indigestion.  Objective:  NAD We discussed the IBS above.  (She has a new Financial controller)  Spent 1/2 hour face to face  Assessment:  IBS intermittently.  Plan:  Refill Xifaxan 550 bid prn   Gait disturbance:  Varies day to day.  Continues with exercise (silver sneakers).  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  He says her gait is better.  He recommends PT but insurance won't cover.  Started after laceration to left knee in Sept 2013  We discussed recent labs re: creatinine and A1C: there has been no recent change and patient does not have a significant correctable problem.  Continued weight control and regular exercise with biannual followup is all that is needed for now.

## 2012-10-07 ENCOUNTER — Other Ambulatory Visit: Payer: Self-pay

## 2012-10-07 MED ORDER — BUSPIRONE HCL 30 MG PO TABS: 30.0000 mg | ORAL_TABLET | Freq: Two times a day (BID) | ORAL | Status: DC

## 2012-11-04 ENCOUNTER — Ambulatory Visit (INDEPENDENT_AMBULATORY_CARE_PROVIDER_SITE_OTHER): Payer: Medicare Other | Admitting: General Surgery

## 2012-11-04 ENCOUNTER — Encounter (INDEPENDENT_AMBULATORY_CARE_PROVIDER_SITE_OTHER): Payer: Self-pay | Admitting: General Surgery

## 2012-11-04 VITALS — BP 110/82 | HR 72 | Temp 98.2°F | Resp 18 | Ht 64.0 in | Wt 180.0 lb

## 2012-11-04 DIAGNOSIS — C50919 Malignant neoplasm of unspecified site of unspecified female breast: Secondary | ICD-10-CM

## 2012-11-04 DIAGNOSIS — C50912 Malignant neoplasm of unspecified site of left female breast: Secondary | ICD-10-CM

## 2012-11-04 NOTE — Progress Notes (Signed)
Patient ID: [name] [name], female   DOB: 20-Mar-1941, 72 y.o.   MRN: [id number] History: This patient returns for long-term followup of her breast cancer. She developed left breast cancer in 1996 treated with lumpectomy and radiation therapy.  patient developed a recurrence in the left breast which was a stage TIa.    She then underwent a left total mastectomy and right prophylactic mastectomy in 2002. There has been no recurrence to date. She continues to request follow up with me for a cancer check. She has no complaints about her arms or range of motion or her chest wall. She has no pain. She is followed by Dr. Milus Glazier for diabetes and hypertension and irritable bowel syndrome. Things are under good control  ROS: 10 system review of systems is negative except as described above. She did have some balance problems, had an extensive neurology workup at [facility], and no significant findings were found.  Exam: Patient looks well. Friendly as usual. Neck reveals no adenopathy or mass. Lungs clear to auscultation bilaterally Heart regular rate and rhythm. No murmur. No ectopy Breasts bilateral mastectomy scars well healed. The right side is very clean and soft. . On the left side she has some thinning and scarring of the soft tissue because of radiation and wound healing problems but is  completely healed. There is no nodules or ulceration. Is no sign of cancer. No axillary adenopathy. Range of motion shoulder 400%  Assessment: Carcinoma of the left breast, stage TI A., status post left total mastectomy. No evidence of recurrence 11.5 years postop Remote history carcinoma left breast, 1996, s/p  lumpectomy and radiation therapy Status post right prophylactic mastectomy No evidence of recurrent cancer bilaterally  Plan at her request I will see her in one year for physical exam.    Angelia Mould. Derrell Lolling, M.D., [facility] General and Minimally invasive Surgery Breast  and Colorectal Surgery Office:   [phone] Pager:   [phone]

## 2012-11-04 NOTE — Patient Instructions (Signed)
Examination of your chest wall, mastectomy wound, and all the regional lymph nodes is normal. There is no evidence of breast cancer.  Return to see Dr. Derrell Lolling in one year.

## 2012-12-06 ENCOUNTER — Other Ambulatory Visit: Payer: Self-pay | Admitting: Family Medicine

## 2013-01-04 ENCOUNTER — Ambulatory Visit (INDEPENDENT_AMBULATORY_CARE_PROVIDER_SITE_OTHER): Payer: Medicare Other | Admitting: Family Medicine

## 2013-01-04 ENCOUNTER — Ambulatory Visit: Payer: Medicare Other

## 2013-01-04 VITALS — BP 132/78 | HR 83 | Temp 98.2°F | Resp 16 | Ht 64.25 in | Wt 180.4 lb

## 2013-01-04 DIAGNOSIS — R079 Chest pain, unspecified: Secondary | ICD-10-CM

## 2013-01-04 DIAGNOSIS — R269 Unspecified abnormalities of gait and mobility: Secondary | ICD-10-CM

## 2013-01-04 IMAGING — CR DG CHEST 2V
2 series · 2 of 2 positions shown · non-contrast
Comparison: [DATE].

CLINICAL DATA: The chest pain.

CHEST - 2 VIEW

[PA]
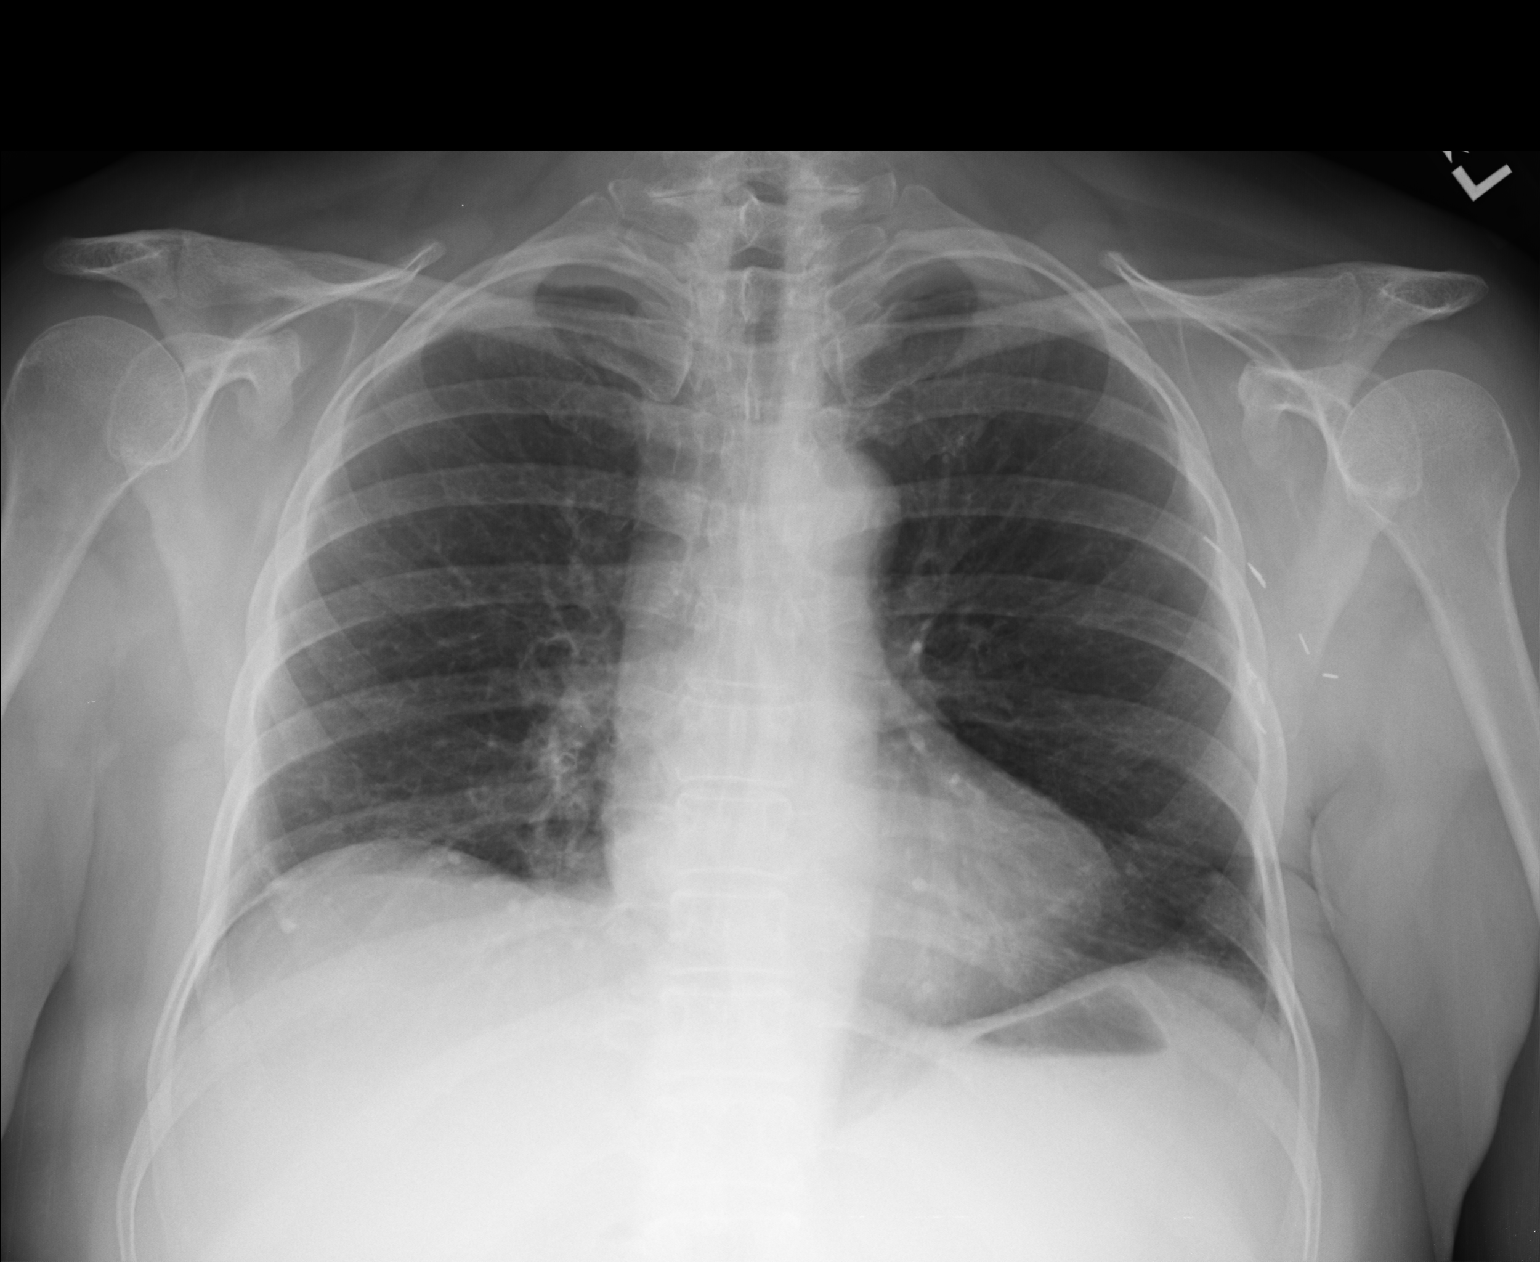

[lateral]
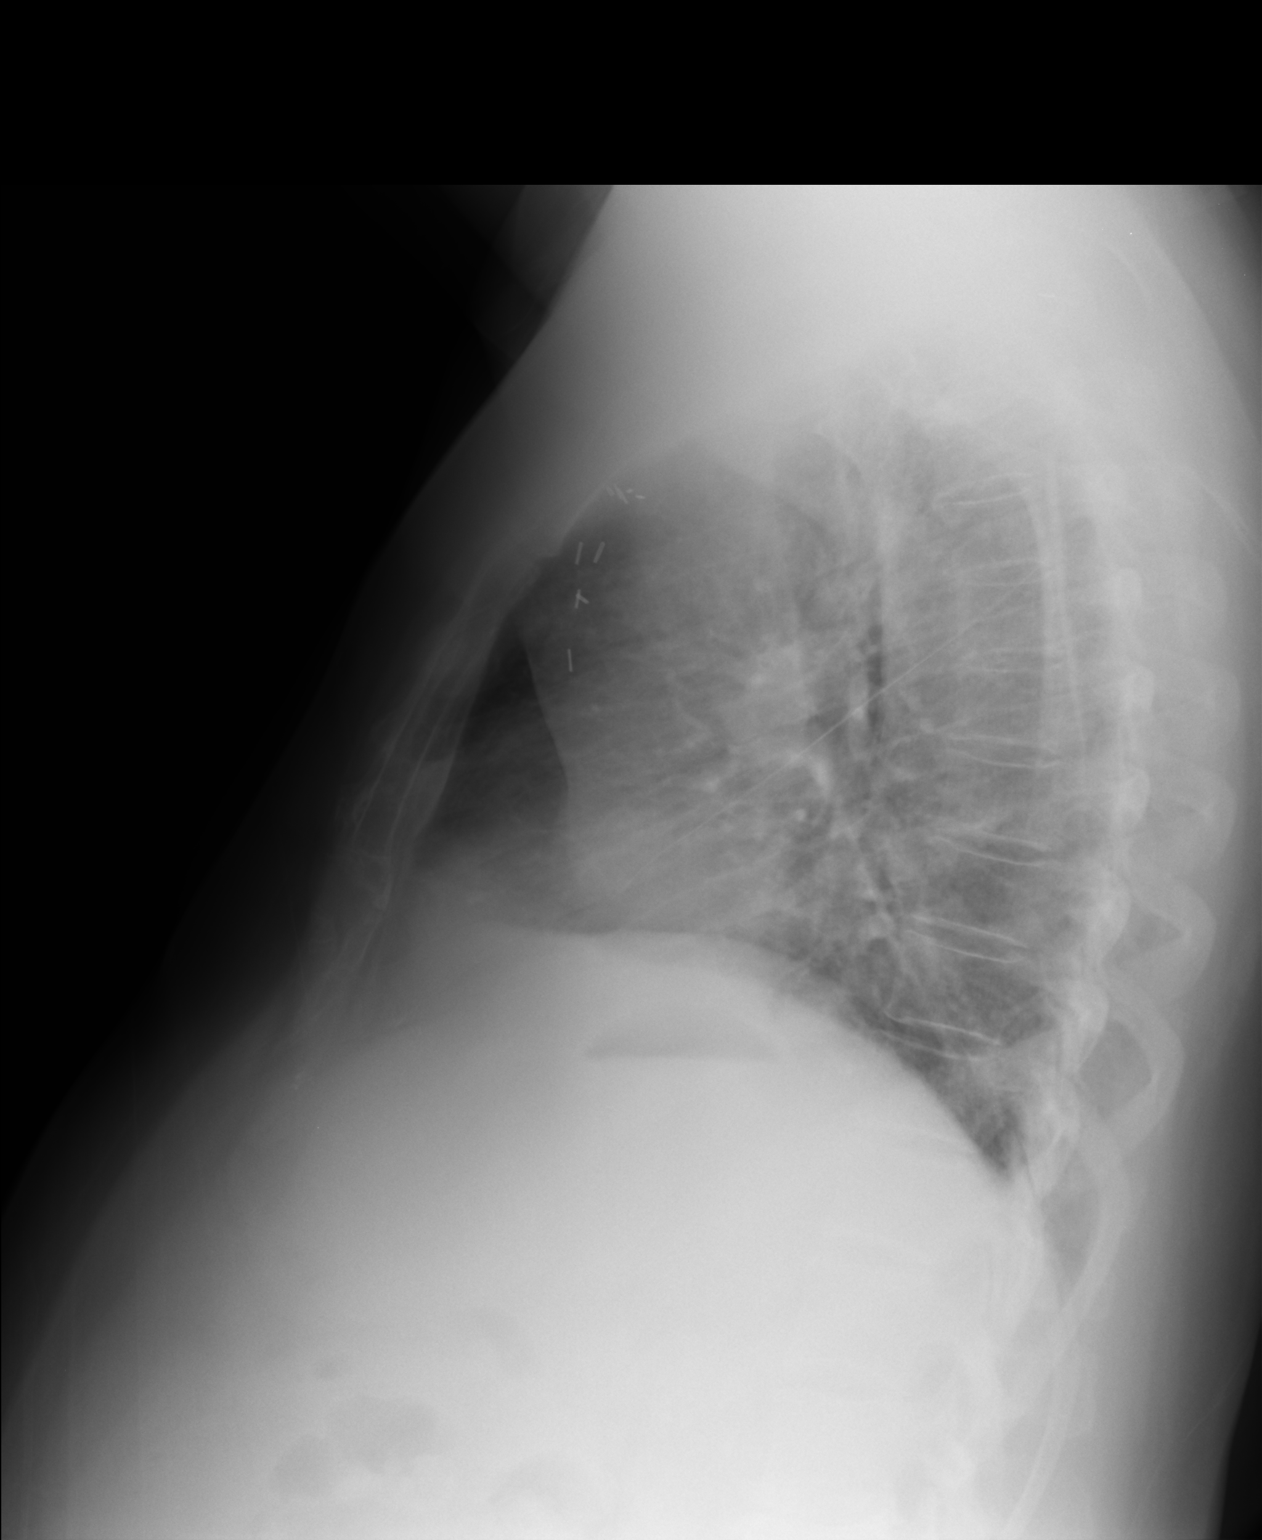

[2 of 2 positions shown; findings below may reference images not displayed]

FINDINGS: The heart, mediastinum and hilar contours are normal.
The lungs are clear.  There are no effusions or pneumothoraces.
There are no acute bony changes.  Surgical clips are again noted in
the left axilla.
IMPRESSION: No active disease.

Clinically significant discrepancy from primary report, if
provided: None

## 2013-01-04 NOTE — Patient Instructions (Addendum)
[name] [name] has a gait disturbance. Because of the unstable nature of this condition, I recommended that she continue with gait training either work or physical therapy or other evidence to ensure that she does not have falls or other injury. Please contact me if you need further documentation or written prescription. This should be continued until patient is stable on her feet.  I've also instructed [name] [name] to start proton pump inhibitors for one week because of her recent medical problems. She's to call me if symptoms of chest pain recur.

## 2013-01-04 NOTE — Progress Notes (Signed)
This 72 year old woman with hyperlipidemia, hypertension, GERD, and family history of heart disease. Her brother died of congestive failure both her parents had coronary disease. He presents today with a day and a half of a dull continuous chest pain. She's had the same kind of pain in the past but it's always resolved. She says the pain is worse when she bends over to put on her shoes or after she lay down all night.  Patient denies shortness of breath, nausea, or diaphoresis. She has noted some slight increase in swelling in the left leg and she does note that she has chronic mild soreness in her right leg which has been unchanged and has been diagnosed as nocturnal cramping.  Patient says the pain has occasionally radiated to her back. She notes no obvious waterbrash, and her chest is nontender. 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 She's continued to be somewhat sensitive to eating berries and other fruits.  On another note, patient says that her gait has been about the same. She still little bit unstable but is used up her physical therapy benefit so that she is unable to continue gait training with was so helpful.  Objective: Patient is calm, articulate, and has great eye contact HEENT: Unremarkable Neck: Supple no adenopathy Chest: Nontender, clear to auscultation Heart: Regular, no murmur, no gallop Abdomen: Mildly obese, soft nontender without HSM or mass Skin: Unremarkable Extremities: No edema  EKG:  [facility] reading (PRIMARY) by  Dr. Milus Glazier  CXR  negative Patient was given a GI cocktail: Patient significantly improved  Assessment: GERD with probable hiatal hernia. She also has a gait disturbance which needs further attention.  Plan: 1 week of proton pump inhibitor  Gait training to be continued  Signed, Sheila Oats.D.

## 2013-02-01 ENCOUNTER — Other Ambulatory Visit: Payer: Self-pay | Admitting: Internal Medicine

## 2013-02-03 ENCOUNTER — Other Ambulatory Visit: Payer: Self-pay | Admitting: Family Medicine

## 2013-02-07 ENCOUNTER — Telehealth: Payer: Self-pay

## 2013-02-07 NOTE — Telephone Encounter (Signed)
Pt states she saw dr L in June of this year and the spoke about her going to the [place] [facility]. States she was told by friends that if it is deemed medically necessary it would be covered by insurance.  Pt states the balance clinic needs referral to schedule pt and proof that it is medically necessary for her insurance to cover it.   Best: [phone]  [facility]  Phone: [phone] Fax: [phone]  bf

## 2013-02-11 ENCOUNTER — Other Ambulatory Visit: Payer: Self-pay | Admitting: Family Medicine

## 2013-02-11 DIAGNOSIS — R2681 Unsteadiness on feet: Secondary | ICD-10-CM

## 2013-02-20 ENCOUNTER — Ambulatory Visit: Payer: Medicare Other | Attending: Family Medicine | Admitting: Physical Therapy

## 2013-02-20 DIAGNOSIS — IMO0001 Reserved for inherently not codable concepts without codable children: Secondary | ICD-10-CM | POA: Insufficient documentation

## 2013-02-20 DIAGNOSIS — R269 Unspecified abnormalities of gait and mobility: Secondary | ICD-10-CM | POA: Insufficient documentation

## 2013-02-20 DIAGNOSIS — Z9181 History of falling: Secondary | ICD-10-CM | POA: Insufficient documentation

## 2013-02-24 ENCOUNTER — Ambulatory Visit: Payer: Medicare Other | Admitting: Physical Therapy

## 2013-02-25 ENCOUNTER — Ambulatory Visit: Payer: Medicare Other | Admitting: Physical Therapy

## 2013-02-27 ENCOUNTER — Ambulatory Visit: Payer: Medicare Other | Admitting: Physical Therapy

## 2013-03-04 ENCOUNTER — Ambulatory Visit: Payer: Medicare Other | Admitting: Physical Therapy

## 2013-03-05 ENCOUNTER — Telehealth: Payer: Self-pay | Admitting: Family Medicine

## 2013-03-05 NOTE — Telephone Encounter (Signed)
Dr. Elbert Ewings   Mrs Carreras would like to know if you can put her lab orders in for September 12 for her September 18 appointment so that you will have all the lab results for her visit?  Please let us know if this is possible so that we can call her and let her know.  thanks

## 2013-03-06 ENCOUNTER — Ambulatory Visit: Payer: Medicare Other | Admitting: Physical Therapy

## 2013-03-06 ENCOUNTER — Other Ambulatory Visit: Payer: Self-pay | Admitting: Family Medicine

## 2013-03-06 DIAGNOSIS — Z0000 Encounter for general adult medical examination without abnormal findings: Secondary | ICD-10-CM

## 2013-03-11 ENCOUNTER — Ambulatory Visit: Payer: Medicare Other | Attending: Family Medicine | Admitting: Physical Therapy

## 2013-03-11 DIAGNOSIS — Z9181 History of falling: Secondary | ICD-10-CM | POA: Insufficient documentation

## 2013-03-11 DIAGNOSIS — IMO0001 Reserved for inherently not codable concepts without codable children: Secondary | ICD-10-CM | POA: Insufficient documentation

## 2013-03-11 DIAGNOSIS — R269 Unspecified abnormalities of gait and mobility: Secondary | ICD-10-CM | POA: Insufficient documentation

## 2013-03-13 ENCOUNTER — Ambulatory Visit: Payer: Medicare Other | Admitting: Physical Therapy

## 2013-03-17 ENCOUNTER — Ambulatory Visit: Payer: Medicare Other | Admitting: Physical Therapy

## 2013-03-18 ENCOUNTER — Ambulatory Visit: Payer: Medicare Other | Admitting: Physical Therapy

## 2013-03-20 ENCOUNTER — Ambulatory Visit: Payer: Medicare Other | Admitting: Physical Therapy

## 2013-03-24 ENCOUNTER — Other Ambulatory Visit (INDEPENDENT_AMBULATORY_CARE_PROVIDER_SITE_OTHER): Payer: Medicare Other | Admitting: *Deleted

## 2013-03-24 ENCOUNTER — Ambulatory Visit: Payer: Medicare Other | Admitting: Physical Therapy

## 2013-03-24 DIAGNOSIS — Z0000 Encounter for general adult medical examination without abnormal findings: Secondary | ICD-10-CM

## 2013-03-24 LAB — COMPREHENSIVE METABOLIC PANEL
ALT: 34 U/L (ref 0–35)
AST: 24 U/L (ref 0–37)
Albumin: 4.2 g/dL (ref 3.5–5.2)
Alkaline Phosphatase: 120 U/L — ABNORMAL HIGH (ref 39–117)
BUN: 23 mg/dL (ref 6–23)
CO2: 29 mEq/L (ref 19–32)
Calcium: 9.9 mg/dL (ref 8.4–10.5)
Chloride: 89 mEq/L — ABNORMAL LOW (ref 96–112)
Creat: 1.23 mg/dL — ABNORMAL HIGH (ref 0.50–1.10)
Glucose, Bld: 423 mg/dL — ABNORMAL HIGH (ref 70–99)
Potassium: 3.2 mEq/L — ABNORMAL LOW (ref 3.5–5.3)
Sodium: 131 mEq/L — ABNORMAL LOW (ref 135–145)
Total Bilirubin: 0.7 mg/dL (ref 0.3–1.2)
Total Protein: 6.7 g/dL (ref 6.0–8.3)

## 2013-03-24 LAB — CBC
HCT: 45.3 % (ref 36.0–46.0)
Hemoglobin: 15.9 g/dL — ABNORMAL HIGH (ref 12.0–15.0)
MCH: 29.8 pg (ref 26.0–34.0)
MCHC: 35.1 g/dL (ref 30.0–36.0)
MCV: 85 fL (ref 78.0–100.0)
Platelets: 321 10*3/uL (ref 150–400)
RBC: 5.33 MIL/uL — ABNORMAL HIGH (ref 3.87–5.11)
RDW: 12.8 % (ref 11.5–15.5)
WBC: 7.4 10*3/uL (ref 4.0–10.5)

## 2013-03-24 LAB — LIPID PANEL
Cholesterol: 203 mg/dL — ABNORMAL HIGH (ref 0–200)
HDL: 35 mg/dL — ABNORMAL LOW (ref >39)
Total CHOL/HDL Ratio: 5.8 Ratio
Triglycerides: 519 mg/dL — ABNORMAL HIGH (ref <150)

## 2013-03-24 NOTE — Progress Notes (Signed)
Patient here for labs only. 

## 2013-03-25 ENCOUNTER — Other Ambulatory Visit: Payer: Self-pay | Admitting: Family Medicine

## 2013-03-25 ENCOUNTER — Ambulatory Visit: Payer: Medicare Other | Admitting: Physical Therapy

## 2013-03-25 DIAGNOSIS — E876 Hypokalemia: Secondary | ICD-10-CM

## 2013-03-25 LAB — TSH: TSH: 3.263 u[IU]/mL (ref 0.350–4.500)

## 2013-03-25 MED ORDER — POTASSIUM CHLORIDE CRYS ER 20 MEQ PO TBCR: 20.0000 meq | EXTENDED_RELEASE_TABLET | Freq: Every day | ORAL | Status: DC

## 2013-03-27 ENCOUNTER — Ambulatory Visit (INDEPENDENT_AMBULATORY_CARE_PROVIDER_SITE_OTHER): Payer: Medicare Other | Admitting: Family Medicine

## 2013-03-27 ENCOUNTER — Encounter: Payer: Self-pay | Admitting: Family Medicine

## 2013-03-27 ENCOUNTER — Ambulatory Visit: Payer: Medicare Other | Admitting: Physical Therapy

## 2013-03-27 VITALS — BP 134/70 | HR 86 | Temp 98.1°F | Resp 18 | Ht 64.0 in | Wt 169.8 lb

## 2013-03-27 DIAGNOSIS — G4700 Insomnia, unspecified: Secondary | ICD-10-CM

## 2013-03-27 DIAGNOSIS — R350 Frequency of micturition: Secondary | ICD-10-CM

## 2013-03-27 DIAGNOSIS — J302 Other seasonal allergic rhinitis: Secondary | ICD-10-CM

## 2013-03-27 DIAGNOSIS — I10 Essential (primary) hypertension: Secondary | ICD-10-CM

## 2013-03-27 DIAGNOSIS — Z0000 Encounter for general adult medical examination without abnormal findings: Secondary | ICD-10-CM

## 2013-03-27 DIAGNOSIS — E119 Type 2 diabetes mellitus without complications: Secondary | ICD-10-CM

## 2013-03-27 DIAGNOSIS — F419 Anxiety disorder, unspecified: Secondary | ICD-10-CM

## 2013-03-27 LAB — POCT URINALYSIS DIPSTICK
Bilirubin, UA: NEGATIVE
Ketones, UA: NEGATIVE
Nitrite, UA: NEGATIVE
Protein, UA: NEGATIVE
Spec Grav, UA: 1.01
Urobilinogen, UA: 0.2
pH, UA: 7

## 2013-03-27 LAB — POCT UA - MICROSCOPIC ONLY
Bacteria, U Microscopic: NEGATIVE
Casts, Ur, LPF, POC: NEGATIVE
Crystals, Ur, HPF, POC: NEGATIVE
Yeast, UA: NEGATIVE

## 2013-03-27 MED ORDER — TRIAMTERENE 50 MG PO CAPS: 50.0000 mg | ORAL_CAPSULE | Freq: Two times a day (BID) | ORAL | Status: DC

## 2013-03-27 MED ORDER — TOPROL XL 100 MG PO TB24: 100.0000 mg | ORAL_TABLET | Freq: Every day | ORAL | Status: DC

## 2013-03-27 MED ORDER — BUSPIRONE HCL 30 MG PO TABS: 30.0000 mg | ORAL_TABLET | Freq: Two times a day (BID) | ORAL | Status: DC

## 2013-03-27 MED ORDER — CIPROFLOXACIN HCL 250 MG PO TABS: 250.0000 mg | ORAL_TABLET | Freq: Two times a day (BID) | ORAL | Status: DC

## 2013-03-27 MED ORDER — DIAZEPAM 5 MG PO TABS: 2.5000 mg | ORAL_TABLET | Freq: Every evening | ORAL | Status: DC | PRN

## 2013-03-27 MED ORDER — FLUTICASONE PROPIONATE 50 MCG/ACT NA SUSP: 2.0000 | Freq: Every day | NASAL | Status: DC

## 2013-03-27 MED ORDER — GLIPIZIDE 5 MG PO TABS: 5.0000 mg | ORAL_TABLET | Freq: Two times a day (BID) | ORAL | Status: DC

## 2013-03-27 MED ORDER — ZALEPLON 5 MG PO CAPS: 5.0000 mg | ORAL_CAPSULE | Freq: Every day | ORAL | Status: DC

## 2013-03-27 NOTE — Patient Instructions (Addendum)
Potassium for only one week. Start Triamterene daily tomorrow. Sonata 5 mg as needed for sleep.   Health Maintenance, Females A healthy lifestyle and preventative care can promote health and wellness.  Maintain regular health, dental, and eye exams.  Eat a healthy diet. Foods like vegetables, fruits, whole grains, low-fat dairy products, and lean protein foods contain the nutrients you need without too many calories. Decrease your intake of foods high in solid fats, added sugars, and salt. Get information about a proper diet from your caregiver, if necessary.  Regular physical exercise is one of the most important things you can do for your health. Most adults should get at least 150 minutes of moderate-intensity exercise (any activity that increases your heart rate and causes you to sweat) each week. In addition, most adults need muscle-strengthening exercises on 2 or more days a week.   Maintain a healthy weight. The body mass index (BMI) is a screening tool to identify possible weight problems. It provides an estimate of body fat based on height and weight. Your caregiver can help determine your BMI, and can help you achieve or maintain a healthy weight. For adults 20 years and older:  A BMI below 18.5 is considered underweight.  A BMI of 18.5 to 24.9 is normal.  A BMI of 25 to 29.9 is considered overweight.  A BMI of 30 and above is considered obese.  Maintain normal blood lipids and cholesterol by exercising and minimizing your intake of saturated fat. Eat a balanced diet with plenty of fruits and vegetables. Blood tests for lipids and cholesterol should begin at age 80 and be repeated every 5 years. If your lipid or cholesterol levels are high, you are over 50, or you are a high risk for heart disease, you may need your cholesterol levels checked more frequently.Ongoing high lipid and cholesterol levels should be treated with medicines if diet and exercise are not effective.  If you  smoke, find out from your caregiver how to quit. If you do not use tobacco, do not start.  If you are pregnant, do not drink alcohol. If you are breastfeeding, be very cautious about drinking alcohol. If you are not pregnant and choose to drink alcohol, do not exceed 1 drink per day. One drink is considered to be 12 ounces (355 mL) of beer, 5 ounces (148 mL) of wine, or 1.5 ounces (44 mL) of liquor.  Avoid use of street drugs. Do not share needles with anyone. Ask for help if you need support or instructions about stopping the use of drugs.  High blood pressure causes heart disease and increases the risk of stroke. Blood pressure should be checked at least every 1 to 2 years. Ongoing high blood pressure should be treated with medicines, if weight loss and exercise are not effective.  If you are 60 to 72 years old, ask your caregiver if you should take aspirin to prevent strokes.  Diabetes screening involves taking a blood sample to check your fasting blood sugar level. This should be done once every 3 years, after age 35, if you are within normal weight and without risk factors for diabetes. Testing should be considered at a younger age or be carried out more frequently if you are overweight and have at least 1 risk factor for diabetes.  Breast cancer screening is essential preventative care for women. You should practice "breast self-awareness." This means understanding the normal appearance and feel of your breasts and may include breast self-examination. Any changes detected,  no matter how small, should be reported to a caregiver. Women in their 69s and 30s should have a clinical breast exam (CBE) by a caregiver as part of a regular health exam every 1 to 3 years. After age 27, women should have a CBE every year. Starting at age 70, women should consider having a mammogram (breast X-ray) every year. Women who have a family history of breast cancer should talk to their caregiver about genetic  screening. Women at a high risk of breast cancer should talk to their caregiver about having an MRI and a mammogram every year.  The Pap test is a screening test for cervical cancer. Women should have a Pap test starting at age 43. Between ages 37 and 2, Pap tests should be repeated every 2 years. Beginning at age 67, you should have a Pap test every 3 years as long as the past 3 Pap tests have been normal. If you had a hysterectomy for a problem that was not cancer or a condition that could lead to cancer, then you no longer need Pap tests. If you are between ages 49 and 70, and you have had normal Pap tests going back 10 years, you no longer need Pap tests. If you have had past treatment for cervical cancer or a condition that could lead to cancer, you need Pap tests and screening for cancer for at least 20 years after your treatment. If Pap tests have been discontinued, risk factors (such as a new sexual partner) need to be reassessed to determine if screening should be resumed. Some women have medical problems that increase the chance of getting cervical cancer. In these cases, your caregiver may recommend more frequent screening and Pap tests.  The human papillomavirus (HPV) test is an additional test that may be used for cervical cancer screening. The HPV test looks for the virus that can cause the cell changes on the cervix. The cells collected during the Pap test can be tested for HPV. The HPV test could be used to screen women aged 44 years and older, and should be used in women of any age who have unclear Pap test results. After the age of 65, women should have HPV testing at the same frequency as a Pap test.  Colorectal cancer can be detected and often prevented. Most routine colorectal cancer screening begins at the age of 55 and continues through age 77. However, your caregiver may recommend screening at an earlier age if you have risk factors for colon cancer. On a yearly basis, your caregiver  may provide home test kits to check for hidden blood in the stool. Use of a small camera at the end of a tube, to directly examine the colon (sigmoidoscopy or colonoscopy), can detect the earliest forms of colorectal cancer. Talk to your caregiver about this at age 72, when routine screening begins. Direct examination of the colon should be repeated every 5 to 10 years through age 10, unless early forms of pre-cancerous polyps or small growths are found.  Hepatitis C blood testing is recommended for all people born from [birth year] through 1965 and any individual with known risks for hepatitis C.  Practice safe sex. Use condoms and avoid high-risk sexual practices to reduce the spread of sexually transmitted infections (STIs). Sexually active women aged 56 and younger should be checked for Chlamydia, which is a common sexually transmitted infection. Older women with new or multiple partners should also be tested for Chlamydia. Testing for other STIs is  recommended if you are sexually active and at increased risk.  Osteoporosis is a disease in which the bones lose minerals and strength with aging. This can result in serious bone fractures. The risk of osteoporosis can be identified using a bone density scan. Women ages 41 and over and women at risk for fractures or osteoporosis should discuss screening with their caregivers. Ask your caregiver whether you should be taking a calcium supplement or vitamin D to reduce the rate of osteoporosis.  Menopause can be associated with physical symptoms and risks. Hormone replacement therapy is available to decrease symptoms and risks. You should talk to your caregiver about whether hormone replacement therapy is right for you.  Use sunscreen with a sun protection factor (SPF) of 30 or greater. Apply sunscreen liberally and repeatedly throughout the day. You should seek shade when your shadow is shorter than you. Protect yourself by wearing long sleeves, pants, a wide-brimmed  hat, and sunglasses year round, whenever you are outdoors.  Notify your caregiver of new moles or changes in moles, especially if there is a change in shape or color. Also notify your caregiver if a mole is larger than the size of a pencil eraser.  Stay current with your immunizations. Document Released: 01/09/2011 Document Revised: 09/18/2011 Document Reviewed: 01/09/2011 [facility] Patient Information 2014 [place], [place]. Urinary Tract Infection Urinary tract infections (UTIs) can develop anywhere along your urinary tract. Your urinary tract is your body's drainage system for removing wastes and extra water. Your urinary tract includes two kidneys, two ureters, a bladder, and a urethra. Your kidneys are a pair of bean-shaped organs. Each kidney is about the size of your fist. They are located below your ribs, one on each side of your spine. CAUSES Infections are caused by microbes, which are microscopic organisms, including fungi, viruses, and bacteria. These organisms are so small that they can only be seen through a microscope. Bacteria are the microbes that most commonly cause UTIs. SYMPTOMS  Symptoms of UTIs may vary by age and gender of the patient and by the location of the infection. Symptoms in young women typically include a frequent and intense urge to urinate and a painful, burning feeling in the bladder or urethra during urination. Older women and men are more likely to be tired, shaky, and weak and have muscle aches and abdominal pain. A fever may mean the infection is in your kidneys. Other symptoms of a kidney infection include pain in your back or sides below the ribs, nausea, and vomiting. DIAGNOSIS To diagnose a UTI, your caregiver will ask you about your symptoms. Your caregiver also will ask to provide a urine sample. The urine sample will be tested for bacteria and white blood cells. White blood cells are made by your body to help fight infection. TREATMENT  Typically, UTIs can  be treated with medication. Because most UTIs are caused by a bacterial infection, they usually can be treated with the use of antibiotics. The choice of antibiotic and length of treatment depend on your symptoms and the type of bacteria causing your infection. HOME CARE INSTRUCTIONS  If you were prescribed antibiotics, take them exactly as your caregiver instructs you. Finish the medication even if you feel better after you have only taken some of the medication.  Drink enough water and fluids to keep your urine clear or pale yellow.  Avoid caffeine, tea, and carbonated beverages. They tend to irritate your bladder.  Empty your bladder often. Avoid holding urine for long periods of  time.  Empty your bladder before and after sexual intercourse.  After a bowel movement, women should cleanse from front to back. Use each tissue only once. SEEK MEDICAL CARE IF:   You have back pain.  You develop a fever.  Your symptoms do not begin to resolve within 3 days. SEEK IMMEDIATE MEDICAL CARE IF:   You have severe back pain or lower abdominal pain.  You develop chills.  You have nausea or vomiting.  You have continued burning or discomfort with urination. MAKE SURE YOU:   Understand these instructions.  Will watch your condition.  Will get help right away if you are not doing well or get worse. Document Released: 04/05/2005 Document Revised: 12/26/2011 Document Reviewed: 08/04/2011 [facility] Patient Information 2014 [place], [place].

## 2013-03-27 NOTE — Progress Notes (Signed)
Patient ID: [name] [name] MRN: [id number], DOB: 02/18/1941, 72 y.o. Date of Encounter: 03/27/2013, 10:17 AM  Primary Physician: Elvina Sidle, MD  Chief Complaint: Physical (CPE)  HPI: 72 y.o. y/o female with history of noted below here for CPE.   This is a  72 year old woman with hyperlipidemia, hypertension, GERD, and family history of heart disease. Her brother died of congestive failure both her parents had coronary disease  Having some urinary inflammation and vaginal irritation. Notes dry mouth and occasional dysphagia. Notes insomnia with frequent bothersome awakening. Balance problems persist despite PT and Tai Chi  She has seen Dr. Juliene Pina the last couple months, had mammogram this year, had colonoscopy last year.  Review of Systems: Consitutional: No fever, chills, night sweats, lymphadenopathy, or weight changes. Eyes: No visual changes, eye redness, or discharge. ENT/Mouth: Ears: No otalgia, tinnitus, hearing loss, discharge. Nose: No congestion, rhinorrhea, sinus pain, or epistaxis. Throat: No sore throat, post nasal drip, or teeth pain. Cardiovascular: No CP, palpitations, diaphoresis, DOE, edema, orthopnea, PND. Respiratory: No cough, hemoptysis, SOB, or wheezing. Gastrointestinal: No anorexia, dysphagia, reflux, pain, nausea, vomiting, hematemesis, diarrhea, constipation, BRBPR, or melena. Breast: No discharge, pain, swelling, or mass. Genitourinary: No dysuria, urgency, hematuria, nocturia, amenorrhea, vaginal discharge, pruritis, burning, abnormal bleeding, or pain. Musculoskeletal: No decreased ROM, myalgias, stiffness, joint swelling, or weakness. Skin: No rash, erythema, lesion changes, pain, warmth, jaundice, or pruritis. Neurological: No headache, dizziness, syncope, seizures, tremors, memory loss, or paresthesias. Psychological: No anxiety, depression, hallucinations, SI/HI. Endocrine: No fatigue, polydipsia, polyphagia, polyuria, or known diabetes. All  other systems were reviewed and are otherwise negative.  Past Medical History  Diagnosis Date  . Hypertension   . Arthritis   . IBS (irritable bowel syndrome)   . Allergy   . Clotting disorder   . Diabetes mellitus   . Cancer     breast and skin     Past Surgical History  Procedure Laterality Date  . Mastectomy  2002    Bilateral  . Breast lumpectomy  1996  . Abdominal hysterectomy  1985    Home Meds:  Prior to Admission medications   Medication Sig Start Date End Date Taking? Authorizing Provider  busPIRone (BUSPAR) 30 MG tablet Take 1 tablet (30 mg total) by mouth 2 (two) times daily. PATIENT NEEDS OFFICE VISIT FOR ADDITIONAL REFILLS 02/03/13   Elvina Sidle, MD  diazepam (VALIUM) 5 MG tablet Take 2.5 mg by mouth at bedtime as needed.    Historical Provider, MD  Fexofenadine HCl (ALLEGRA PO) Take by mouth. Or will take the generic version of Claritin.    Historical Provider, MD  fluticasone (FLONASE) 50 MCG/ACT nasal spray Place 2 sprays into the nose daily. 12/21/11   Elvina Sidle, MD  montelukast (SINGULAIR) 10 MG tablet Take 1 tablet (10 mg total) by mouth at bedtime. 12/21/11 12/20/12  Elvina Sidle, MD  potassium chloride SA (K-DUR,KLOR-CON) 20 MEQ tablet Take 1 tablet (20 mEq total) by mouth daily. 03/25/13   Elvina Sidle, MD  rifaximin (XIFAXAN) 550 MG TABS Take 1 tablet (550 mg total) by mouth 2 (two) times daily as needed. 09/19/12   Elvina Sidle, MD  TOPROL XL 100 MG 24 hr tablet TAKE 1 TABLET TWICE DAILY. 12/06/12   Elvina Sidle, MD  triamterene-hydrochlorothiazide (MAXZIDE) 75-50 MG per tablet Take 1/2 tablet twice daily. PATIENT NEEDS OFFICE VISIT FOR ADDITIONAL REFILLS 02/03/13   Nelva Nay, PA-C    Allergies:  Allergies  Allergen Reactions  . Sulfa Drugs Cross Reactors Rash  All over the body    History   Social History  . Marital Status: Married    Spouse Name: N/A    Number of Children: N/A  . Years of Education: N/A   Occupational  History  . Not on file.   Social History Main Topics  . Smoking status: Never Smoker   . Smokeless tobacco: Never Used  . Alcohol Use: 0.0 oz/week     Comment: social  . Drug Use: No  . Sexual Activity: Not on file   Other Topics Concern  . Not on file   Social History Narrative  . No narrative on file    Family History  Problem Relation Age of Onset  . Heart disease Brother   . Heart disease Mother   . Heart disease Father     Physical Exam: Blood pressure 134/70, pulse 86, temperature 98.1 F (36.7 C), temperature source Oral, resp. rate 18, height 5\' 4"  (1.626 m), weight 169 lb 12.8 oz (77.021 kg), SpO2 94.00%., Body mass index is 29.13 kg/(m^2). General: Well developed, well nourished, in no acute distress. HEENT: Normocephalic, atraumatic. Conjunctiva pink, sclera non-icteric. Pupils 2 mm constricting to 1 mm, round, regular, and equally reactive to light and accomodation. EOMI. Internal auditory canal clear. TMs with good cone of light and without pathology. Nasal mucosa pink. Nares are without discharge. No sinus tenderness. Oral mucosa pink. Dentition normal. Pharynx without exudate.   Neck: Supple. Trachea midline. No thyromegaly. Full ROM. No lymphadenopathy. Lungs: Clear to auscultation bilaterally without wheezes, rales, or rhonchi. Breathing is of normal effort and unlabored. Cardiovascular: RRR with S1 S2. No murmurs, rubs, or gallops appreciated. Distal pulses 2+ symmetrically. No carotid or abdominal bruits. Abdomen: Soft, non-tender, non-distended with normoactive bowel sounds. No hepatosplenomegaly or masses. No rebound/guarding. No CVA tenderness. Without hernias.  Musculoskeletal: Full range of motion and 5/5 strength throughout. Without swelling, atrophy, tenderness, crepitus, or warmth. Extremities without clubbing, cyanosis, or edema. Calves supple. Skin: Warm and moist without erythema, ecchymosis, wounds, or rash. Neuro: A+Ox3. CN II-XII grossly intact.  Moves all extremities spontaneously. Full sensation throughout. Normal gait. DTR 2+ throughout upper and lower extremities. Finger to nose intact. Psych:  Responds to questions appropriately with a normal affect.   Studies:  Results for orders placed in visit on 03/24/13  LIPID PANEL      Result Value Range   Cholesterol 203 (*) 0 - 200 mg/dL   Triglycerides 147 (*) <150 mg/dL   HDL 35 (*) >82 mg/dL   Total CHOL/HDL Ratio 5.8     VLDL NOT CALC  0 - 40 mg/dL   LDL Cholesterol    0 - 99 mg/dL  COMPREHENSIVE METABOLIC PANEL      Result Value Range   Sodium 131 (*) 135 - 145 mEq/L   Potassium 3.2 (*) 3.5 - 5.3 mEq/L   Chloride 89 (*) 96 - 112 mEq/L   CO2 29  19 - 32 mEq/L   Glucose, Bld 423 (*) 70 - 99 mg/dL   BUN 23  6 - 23 mg/dL   Creat 9.56 (*) 2.13 - 1.10 mg/dL   Total Bilirubin 0.7  0.3 - 1.2 mg/dL   Alkaline Phosphatase 120 (*) 39 - 117 U/L   AST 24  0 - 37 U/L   ALT 34  0 - 35 U/L   Total Protein 6.7  6.0 - 8.3 g/dL   Albumin 4.2  3.5 - 5.2 g/dL   Calcium 9.9  8.4 - 08.6  mg/dL  CBC      Result Value Range   WBC 7.4  4.0 - 10.5 K/uL   RBC 5.33 (*) 3.87 - 5.11 MIL/uL   Hemoglobin 15.9 (*) 12.0 - 15.0 g/dL   HCT 16.1  09.6 - 04.5 %   MCV 85.0  78.0 - 100.0 fL   MCH 29.8  26.0 - 34.0 pg   MCHC 35.1  30.0 - 36.0 g/dL   RDW 40.9  81.1 - 91.4 %   Platelets 321  150 - 400 K/uL  TSH      Result Value Range   TSH 3.263  0.350 - 4.500 uIU/mL   Results for orders placed in visit on 03/27/13  POCT UA - MICROSCOPIC ONLY      Result Value Range   WBC, Ur, HPF, POC 2-20     RBC, urine, microscopic 0-5     Bacteria, U Microscopic neg     Mucus, UA trace     Epithelial cells, urine per micros 1-16     Crystals, Ur, HPF, POC neg     Casts, Ur, LPF, POC neg     Yeast, UA neg    POCT URINALYSIS DIPSTICK      Result Value Range   Color, UA yellow     Clarity, UA clear     Glucose, UA >=1000mg /dl     Bilirubin, UA neg     Ketones, UA neg     Spec Grav, UA 1.010     Blood,  UA trace     pH, UA 7.0     Protein, UA neg     Urobilinogen, UA 0.2     Nitrite, UA neg     Leukocytes, UA Trace      Assessment/Plan:  71 y.o. y/o female here for CPE Annual physical exam - Plan: POCT UA - Microscopic Only, POCT urinalysis dipstick  Urinary frequency - Plan: POCT UA - Microscopic Only, POCT urinalysis dipstick, Urine culture, ciprofloxacin (CIPRO) 250 MG tablet  Seasonal allergies - Plan: fluticasone (FLONASE) 50 MCG/ACT nasal spray  Insomnia - Plan: diazepam (VALIUM) 5 MG tablet  Hypertension - Plan: TOPROL XL 100 MG 24 hr tablet, triamterene (DYRENIUM) 50 MG capsule  Anxiety - Plan: diazepam (VALIUM) 5 MG tablet, busPIRone (BUSPAR) 30 MG tablet  Note: Patient's blood sugar is grossly abnormal. She had taken metformin in the past. She hasn't taken anything recently because the metformin was at higher risk drug with her creatinine being elevated.  I asked her to start taking glipizide 5 mg XL daily and return in 2 weeks for followup of the elevated blood sugar -  Signed, Elvina Sidle, MD 03/27/2013 10:17 AM

## 2013-03-28 ENCOUNTER — Telehealth: Payer: Self-pay

## 2013-03-28 LAB — URINE CULTURE: Colony Count: 4000

## 2013-03-28 NOTE — Telephone Encounter (Signed)
Also released her labs to her Mychart.

## 2013-03-28 NOTE — Telephone Encounter (Signed)
Generally, glipizide is well tolerated even in those with sulfa allergy.  She definitely needs to take medicine to lower the sugar.  If she is still worried about the glipizide, I can call in Januvia 50 mg daily

## 2013-03-28 NOTE — Telephone Encounter (Signed)
Pt is calling about her lab results and she has a question about the medication glucotrol that Dr L put her on Call back number is [phone]

## 2013-03-28 NOTE — Telephone Encounter (Signed)
Called her, she is asking about the glucotrol. She states she has a sulfa allergy and is concerned about taking the glucotrol with the sulfa allergy, please advise. Patient wants to double check. Patient indicates she did have a reaction years ago to sulfa, she had rash all over.

## 2013-03-28 NOTE — Telephone Encounter (Signed)
Called pt and advised her message from Dr Milus Glazier. Pt understood and will try Glipizide.

## 2013-03-31 ENCOUNTER — Ambulatory Visit (INDEPENDENT_AMBULATORY_CARE_PROVIDER_SITE_OTHER): Payer: Medicare Other | Admitting: Family Medicine

## 2013-03-31 ENCOUNTER — Ambulatory Visit: Payer: Medicare Other | Admitting: Physical Therapy

## 2013-03-31 VITALS — BP 134/82 | HR 83 | Temp 98.1°F | Resp 18 | Ht 64.5 in | Wt 178.0 lb

## 2013-03-31 DIAGNOSIS — E119 Type 2 diabetes mellitus without complications: Secondary | ICD-10-CM

## 2013-03-31 DIAGNOSIS — R609 Edema, unspecified: Secondary | ICD-10-CM

## 2013-03-31 DIAGNOSIS — E876 Hypokalemia: Secondary | ICD-10-CM

## 2013-03-31 LAB — GLUCOSE, POCT (MANUAL RESULT ENTRY): POC Glucose: 228 mg/dl — AB (ref 70–99)

## 2013-03-31 MED ORDER — FUROSEMIDE 20 MG PO TABS: 20.0000 mg | ORAL_TABLET | Freq: Every day | ORAL | Status: DC

## 2013-03-31 NOTE — Progress Notes (Signed)
72 yo woman with diabetes found to have low potassium and high blood sugar 4 days ago.  She has started glipizide once daily but continues to feel fatigues.  She has also has been taking potassium replacement.    Finally, she has noted a five pound weight gain over the past 4 days.  Objective:  NAD HEENT unremarkable except for mild facial flushing 1+ pedal edema. No rash Results for orders placed in visit on 03/31/13  GLUCOSE, POCT (MANUAL RESULT ENTRY)      Result Value Range   POC Glucose 228 (*) 70 - 99 mg/dl   Assessment:  The persistent malaise is concerning, although there are no localizing signs of severed disease.  Some of the malaise may be attributed to bringing down the blood sugar.  The discomfort from the potassium supplementation is also concerning.  Plan:  Stop the potassium and Dyrenium until CMET available.  Continue the glipizide.  Start Lasix 20 mg po prn.  Call patient tomorrow since she has tickets to [place] on Thursday.  Signed, Elvina Sidle, MD

## 2013-03-31 NOTE — Patient Instructions (Addendum)
Continue the glipizide Stop the potassium Take the Lasix 20 mg daily until your weight is back to where it was last Thursday.  Then, take only if your weight is increasing over several days in any given week.  You may need to take it once or twice a week in the future, depending on the salt content of your food.

## 2013-04-01 LAB — COMPREHENSIVE METABOLIC PANEL
ALT: 34 U/L (ref 0–35)
AST: 31 U/L (ref 0–37)
Albumin: 4 g/dL (ref 3.5–5.2)
Alkaline Phosphatase: 80 U/L (ref 39–117)
BUN: 21 mg/dL (ref 6–23)
CO2: 22 mEq/L (ref 19–32)
Calcium: 9.2 mg/dL (ref 8.4–10.5)
Chloride: 104 mEq/L (ref 96–112)
Creat: 1.14 mg/dL — ABNORMAL HIGH (ref 0.50–1.10)
Glucose, Bld: 208 mg/dL — ABNORMAL HIGH (ref 70–99)
Potassium: 4.5 mEq/L (ref 3.5–5.3)
Sodium: 134 mEq/L — ABNORMAL LOW (ref 135–145)
Total Bilirubin: 0.5 mg/dL (ref 0.3–1.2)
Total Protein: 6.2 g/dL (ref 6.0–8.3)

## 2013-04-02 NOTE — Progress Notes (Signed)
Please have patient simply continue the lasix daily

## 2013-04-10 ENCOUNTER — Ambulatory Visit (INDEPENDENT_AMBULATORY_CARE_PROVIDER_SITE_OTHER): Payer: Medicare Other | Admitting: Family Medicine

## 2013-04-10 ENCOUNTER — Ambulatory Visit: Payer: Medicare Other | Attending: Family Medicine | Admitting: Physical Therapy

## 2013-04-10 ENCOUNTER — Encounter: Payer: Self-pay | Admitting: Family Medicine

## 2013-04-10 VITALS — BP 116/62 | HR 67 | Temp 97.6°F | Resp 16 | Ht 64.0 in | Wt 176.4 lb

## 2013-04-10 DIAGNOSIS — E119 Type 2 diabetes mellitus without complications: Secondary | ICD-10-CM

## 2013-04-10 DIAGNOSIS — R600 Localized edema: Secondary | ICD-10-CM

## 2013-04-10 DIAGNOSIS — IMO0001 Reserved for inherently not codable concepts without codable children: Secondary | ICD-10-CM | POA: Insufficient documentation

## 2013-04-10 DIAGNOSIS — R269 Unspecified abnormalities of gait and mobility: Secondary | ICD-10-CM | POA: Insufficient documentation

## 2013-04-10 DIAGNOSIS — R609 Edema, unspecified: Secondary | ICD-10-CM

## 2013-04-10 DIAGNOSIS — Z9181 History of falling: Secondary | ICD-10-CM | POA: Insufficient documentation

## 2013-04-10 LAB — COMPREHENSIVE METABOLIC PANEL
ALT: 30 U/L (ref 0–35)
AST: 22 U/L (ref 0–37)
Albumin: 3.8 g/dL (ref 3.5–5.2)
Alkaline Phosphatase: 76 U/L (ref 39–117)
BUN: 21 mg/dL (ref 6–23)
CO2: 27 mEq/L (ref 19–32)
Calcium: 8.8 mg/dL (ref 8.4–10.5)
Chloride: 105 mEq/L (ref 96–112)
Creat: 1.02 mg/dL (ref 0.50–1.10)
Glucose, Bld: 158 mg/dL — ABNORMAL HIGH (ref 70–99)
Potassium: 3.7 mEq/L (ref 3.5–5.3)
Sodium: 142 mEq/L (ref 135–145)
Total Bilirubin: 0.5 mg/dL (ref 0.3–1.2)
Total Protein: 5.9 g/dL — ABNORMAL LOW (ref 6.0–8.3)

## 2013-04-10 LAB — GLUCOSE, POCT (MANUAL RESULT ENTRY): POC Glucose: 165 mg/dl — AB (ref 70–99)

## 2013-04-10 NOTE — Progress Notes (Signed)
72 yo married woman who just returned from [place].  She is now feeling much, much better.  She has taken another 1/2 lasix in the evening to cope with late day edema.  No cramps, however.  She recently had a very high glucose, but has started glipizide 5 mg bid and then once day.  No hypoglycemic episodes.   Objective:  NAD Neck supple, no  Thyromegaly Chest:  Clear Heart:  Reg, no murmur Ext:  No significant edema Skin:  No postphlebitic changes or rash. Results for orders placed in visit on 03/31/13  COMPREHENSIVE METABOLIC PANEL      Result Value Range   Sodium 134 (*) 135 - 145 mEq/L   Potassium 4.5  3.5 - 5.3 mEq/L   Chloride 104  96 - 112 mEq/L   CO2 22  19 - 32 mEq/L   Glucose, Bld 208 (*) 70 - 99 mg/dL   BUN 21  6 - 23 mg/dL   Creat 1.61 (*) 0.96 - 1.10 mg/dL   Total Bilirubin 0.5  0.3 - 1.2 mg/dL   Alkaline Phosphatase 80  39 - 117 U/L   AST 31  0 - 37 U/L   ALT 34  0 - 35 U/L   Total Protein 6.2  6.0 - 8.3 g/dL   Albumin 4.0  3.5 - 5.2 g/dL   Calcium 9.2  8.4 - 04.5 mg/dL  GLUCOSE, POCT (MANUAL RESULT ENTRY)      Result Value Range   POC Glucose 228 (*) 70 - 99 mg/dl   Results for orders placed in visit on 04/10/13  GLUCOSE, POCT (MANUAL RESULT ENTRY)      Result Value Range   POC Glucose 165 (*) 70 - 99 mg/dl     Assessment:  Diabetes. Pedal edema.  Both are now coming under control.  Plan:  Type II or unspecified type diabetes mellitus without mention of complication, not stated as uncontrolled - Plan: Comprehensive metabolic panel, POCT glucose (manual entry)  Pedal edema - Plan: Comprehensive metabolic panel, POCT glucose (manual entry) Continue current meds Recheck 3 months  Signed, Elvina Sidle, MD

## 2013-04-14 ENCOUNTER — Ambulatory Visit: Payer: Medicare Other | Admitting: Physical Therapy

## 2013-04-15 ENCOUNTER — Telehealth: Payer: Self-pay

## 2013-04-15 ENCOUNTER — Other Ambulatory Visit: Payer: Self-pay | Admitting: Family Medicine

## 2013-04-15 DIAGNOSIS — R609 Edema, unspecified: Secondary | ICD-10-CM

## 2013-04-15 MED ORDER — FUROSEMIDE 20 MG PO TABS: ORAL_TABLET | ORAL | Status: DC

## 2013-04-15 NOTE — Telephone Encounter (Signed)
Pharm faxed request that Rx for lasix 20 mg be changed to read take 1-2 tabs QD prn per pt's report that Dr L advised pt she can take 2 tabs QD if needed. Dr L, I have pended Rx for your review.

## 2013-04-17 ENCOUNTER — Ambulatory Visit: Payer: Medicare Other | Admitting: Physical Therapy

## 2013-04-24 ENCOUNTER — Ambulatory Visit: Payer: Medicare Other | Admitting: Physical Therapy

## 2013-04-24 NOTE — Telephone Encounter (Signed)
Dr L sent in new Rx.

## 2013-04-30 ENCOUNTER — Ambulatory Visit: Payer: Medicare Other | Admitting: Physical Therapy

## 2013-05-01 ENCOUNTER — Ambulatory Visit: Payer: Medicare Other | Admitting: Physical Therapy

## 2013-05-07 ENCOUNTER — Ambulatory Visit: Payer: Medicare Other | Admitting: Physical Therapy

## 2013-05-09 ENCOUNTER — Ambulatory Visit: Payer: Medicare Other | Admitting: Physical Therapy

## 2013-05-14 ENCOUNTER — Ambulatory Visit: Payer: Medicare Other | Admitting: Physical Therapy

## 2013-05-16 ENCOUNTER — Ambulatory Visit: Payer: Medicare Other | Admitting: Physical Therapy

## 2013-05-21 ENCOUNTER — Ambulatory Visit: Payer: Medicare Other | Admitting: Physical Therapy

## 2013-05-23 ENCOUNTER — Ambulatory Visit: Payer: Medicare Other

## 2013-06-03 ENCOUNTER — Other Ambulatory Visit: Payer: Self-pay | Admitting: Family Medicine

## 2013-07-06 ENCOUNTER — Other Ambulatory Visit: Payer: Self-pay | Admitting: Family Medicine

## 2013-07-07 ENCOUNTER — Telehealth: Payer: Self-pay

## 2013-07-07 DIAGNOSIS — I10 Essential (primary) hypertension: Secondary | ICD-10-CM

## 2013-07-07 DIAGNOSIS — M8580 Other specified disorders of bone density and structure, unspecified site: Secondary | ICD-10-CM

## 2013-07-07 NOTE — Telephone Encounter (Signed)
Pt says Chelle told her she would put in an order for a blood test, but it is not listed under orders.  Please call to clarify.  Pt also said she was advised to get a bone density test by [facility].  Wants to know if she really needs this.  Call  [phone]

## 2013-07-07 NOTE — Telephone Encounter (Signed)
Dr L, note from pharm states pt reports she is supposed to be taking twice daily now. I do not see any recent notes about this increase from QD. Please send in new Rx for BID if that is what you want dose to be.

## 2013-07-08 NOTE — Telephone Encounter (Signed)
Received another req for clarification of Toprol dosage. Dr L, please advise.

## 2013-07-08 NOTE — Telephone Encounter (Signed)
Do you want patient to have labs?  Do you want patient to have bone density study?

## 2013-07-11 NOTE — Telephone Encounter (Signed)
[facility] and advised that Dr L is out of office until 07/16/13 and that I have sent him a message. We will clarify as soon as he reviews. Pharmacist stated she will make a note and notify pt.

## 2013-07-15 NOTE — Addendum Note (Signed)
Addended by: Orion Crook on: 07/15/2013 02:44 PM   Modules accepted: Orders

## 2013-07-16 NOTE — Telephone Encounter (Signed)
Clarified with Dr. Joseph Art and Toprol is bid. [facility] and notified

## 2013-07-31 ENCOUNTER — Other Ambulatory Visit (INDEPENDENT_AMBULATORY_CARE_PROVIDER_SITE_OTHER): Payer: Medicare Other | Admitting: *Deleted

## 2013-07-31 DIAGNOSIS — I10 Essential (primary) hypertension: Secondary | ICD-10-CM

## 2013-07-31 LAB — COMPREHENSIVE METABOLIC PANEL
ALT: 19 U/L (ref 0–35)
AST: 20 U/L (ref 0–37)
Albumin: 4 g/dL (ref 3.5–5.2)
Alkaline Phosphatase: 81 U/L (ref 39–117)
BUN: 29 mg/dL — ABNORMAL HIGH (ref 6–23)
CO2: 23 mEq/L (ref 19–32)
Calcium: 9.3 mg/dL (ref 8.4–10.5)
Chloride: 105 mEq/L (ref 96–112)
Creat: 1.3 mg/dL — ABNORMAL HIGH (ref 0.50–1.10)
Glucose, Bld: 213 mg/dL — ABNORMAL HIGH (ref 70–99)
Potassium: 4.2 mEq/L (ref 3.5–5.3)
Sodium: 140 mEq/L (ref 135–145)
Total Bilirubin: 0.4 mg/dL (ref 0.3–1.2)
Total Protein: 6.1 g/dL (ref 6.0–8.3)

## 2013-07-31 NOTE — Progress Notes (Signed)
Patient here for labs only. 

## 2013-08-07 ENCOUNTER — Other Ambulatory Visit: Payer: Self-pay | Admitting: Family Medicine

## 2013-08-07 ENCOUNTER — Ambulatory Visit (INDEPENDENT_AMBULATORY_CARE_PROVIDER_SITE_OTHER): Payer: Medicare Other | Admitting: Family Medicine

## 2013-08-07 ENCOUNTER — Encounter: Payer: Self-pay | Admitting: Family Medicine

## 2013-08-07 VITALS — BP 128/72 | HR 79 | Temp 98.1°F | Resp 16 | Ht 64.0 in | Wt 184.4 lb

## 2013-08-07 DIAGNOSIS — N952 Postmenopausal atrophic vaginitis: Secondary | ICD-10-CM

## 2013-08-07 DIAGNOSIS — F411 Generalized anxiety disorder: Secondary | ICD-10-CM

## 2013-08-07 DIAGNOSIS — E119 Type 2 diabetes mellitus without complications: Secondary | ICD-10-CM

## 2013-08-07 DIAGNOSIS — L538 Other specified erythematous conditions: Secondary | ICD-10-CM

## 2013-08-07 DIAGNOSIS — F419 Anxiety disorder, unspecified: Secondary | ICD-10-CM

## 2013-08-07 DIAGNOSIS — E1169 Type 2 diabetes mellitus with other specified complication: Secondary | ICD-10-CM

## 2013-08-07 DIAGNOSIS — L304 Erythema intertrigo: Secondary | ICD-10-CM

## 2013-08-07 MED ORDER — LISINOPRIL 10 MG PO TABS: 10.0000 mg | ORAL_TABLET | Freq: Every day | ORAL | Status: DC

## 2013-08-07 MED ORDER — BUSPIRONE HCL 30 MG PO TABS: 30.0000 mg | ORAL_TABLET | Freq: Two times a day (BID) | ORAL | Status: DC

## 2013-08-07 MED ORDER — ESTROGENS, CONJUGATED 0.625 MG/GM VA CREA: 1.0000 | TOPICAL_CREAM | Freq: Every day | VAGINAL | Status: DC

## 2013-08-07 MED ORDER — OSELTAMIVIR PHOSPHATE 75 MG PO CAPS: 75.0000 mg | ORAL_CAPSULE | Freq: Two times a day (BID) | ORAL | Status: DC

## 2013-08-07 MED ORDER — NYSTATIN 100000 UNIT/GM EX POWD: 1.0000 g | Freq: Two times a day (BID) | CUTANEOUS | Status: DC

## 2013-08-07 MED ORDER — GLIPIZIDE ER 2.5 MG PO TB24: 2.5000 mg | ORAL_TABLET | Freq: Every day | ORAL | Status: DC

## 2013-08-07 NOTE — Progress Notes (Addendum)
Subjective:  This chart was scribed for Robyn Haber, MD by Donato Schultz, Medical Scribe. This patient was seen in Room 27 and the patient's care was started at 9:19 AM.   Patient ID: [name] [name], female    DOB: 1940-11-06, 73 y.o.   MRN: [id number]  HPI HPI Comments: [name] [name] is a 74 y.o. female with a history of DM and Hypertension who presents to the [facility] for a follow-up appointment.  The patient states that she is scheduled for a bone density test tomorrow.  She states that she was told by Dr. Clementeen Graham that further bone density tests were unnecessary because her current regimen of taking Vitamin D and Calcium tablets is sufficient enough to ward off bone loss.  The patient states that she been told that a butterfly needle needs to be used for now on when she is getting blood drawn.  She states that she is currently taking 1 tablet of Glipizide.  She states that she will intermittently experience low hypoglycemia spells.  She denies checking her blood sugar at home.  She states that her last glucose reading was 213 without Glipizide.  The patient has a history of gait problems.  The patient states that she is still doing Tai Chi and her balance has mildly improved.  She denies seeing any callous formation or noticing sores between her toes.  She states that a physician at [facility] told her that she has mild neuropathy in her feet.  She states that she will experience a burning sensation in her feet bilaterally intermittently and will apply a sole to her feet for relief.  She states that she was told that her creatinine levels were high during her last lab workup.  She states that she was placed on various ACE inhibitors and experienced low blood sugar.    The patient states that she will experience erratic fluid retention spells.  She states that she has not experienced any problems with fluid retention today.  She states that she will normally take 1 dose  of Lasix in the morning and half a dose in the afternoon and at night.    The patient states that she is visiting the [facility] from [place] 10-18 and would like a prescription for Tamiflu to guard herself against the flu.  She states that she would like a refill of Nystatin powder.  The patient also complains of intermittent vaginal discharge that is green and sticky.  She states that has been given Premarin cream but has not used it since noticing the discharge.    She states that Dr. Sedonia Small is her ophthalmologist and she was told that she is cleared to have cataract surgery in her right eye whenever she is ready.    Past Medical History  Diagnosis Date   Hypertension    Arthritis    IBS (irritable bowel syndrome)    Allergy    Clotting disorder    Diabetes mellitus    Cancer     breast and skin   Past Surgical History  Procedure Laterality Date   Mastectomy  2002    Bilateral   Breast lumpectomy  1996   Abdominal hysterectomy  1985   Family History  Problem Relation Age of Onset   Heart disease Brother    Heart disease Mother    Heart disease Father    History   Social History   Marital Status: Married    Spouse Name:  N/A    Number of Children: N/A   Years of Education: N/A   Occupational History   Not on file.   Social History Main Topics   Smoking status: Never Smoker    Smokeless tobacco: Never Used   Alcohol Use: 0.0 oz/week     Comment: social   Drug Use: No   Sexual Activity: Not on file   Other Topics Concern   Not on file   Social History Narrative   No narrative on file   Allergies  Allergen Reactions   Sulfa Drugs Cross Reactors Rash    All over the body     Review of Systems  Genitourinary: Positive for vaginal discharge (intermittent, green and sticky).  All other systems reviewed and are negative.     Objective:  Physical Exam  Nursing note and vitals reviewed. Constitutional: She is oriented to  person, place, and time. She appears well-developed and well-nourished. No distress.  HENT:  Head: Normocephalic and atraumatic.  Eyes: Conjunctivae and EOM are normal. Pupils are equal, round, and reactive to light. Right eye exhibits no discharge. Left eye exhibits no discharge. No scleral icterus.  Neck: Normal range of motion. Neck supple.  Cardiovascular: Normal rate, normal heart sounds and intact distal pulses.  Exam reveals no gallop and no friction rub.   No murmur heard. Pulmonary/Chest: Effort normal.  Abdominal: Soft. She exhibits no mass. There is no tenderness. There is no rebound and no guarding.  Musculoskeletal: Normal range of motion.  Neurological: She is alert and oriented to person, place, and time. No cranial nerve deficit. Coordination normal.  Wide-based gait Fine resting tremor of hands  Skin: Skin is warm and dry. No rash noted. No pallor.  Psychiatric: She has a normal mood and affect. Her behavior is normal.     BP 128/72   Pulse 79   Temp(Src) 98.1 F (36.7 C) (Oral)   Resp 16   Ht _0  (1.626 m)   Wt 184 lb 6.4 oz (83.643 kg)   BMI 31.64 kg/m2   SpO2 97% Assessment & Plan:    I personally performed the services described in this documentation, which was scribed in my presence. The recorded information has been reviewed and is accurate.  Diabetes type 2, controlled - Plan: oseltamivir (TAMIFLU) 75 MG capsule, lisinopril (PRINIVIL,ZESTRIL) 10 MG tablet, glipiZIDE (GLUCOTROL XL) 2.5 MG 24 hr tablet  Anxiety - Plan: busPIRone (BUSPAR) 30 MG tablet  Intertrigo - Plan: nystatin (MYCOSTATIN/NYSTOP) 100000 UNIT/GM POWD  Atrophic vaginitis - Plan: conjugated estrogens (PREMARIN) vaginal cream  Signed, Robyn Haber, MD

## 2013-08-10 ENCOUNTER — Telehealth: Payer: Self-pay

## 2013-08-10 DIAGNOSIS — E119 Type 2 diabetes mellitus without complications: Secondary | ICD-10-CM

## 2013-08-10 MED ORDER — GLIPIZIDE ER 5 MG PO TB24: 5.0000 mg | ORAL_TABLET | Freq: Every day | ORAL | Status: DC

## 2013-08-10 NOTE — Telephone Encounter (Signed)
Dr. Carlean Jews - Pt says the change in her medications is causing her bad side effects, including an upset stomach.  She is going out of town and plans to switch back to what she was taking.  When she gets back she will try whatever you want her to.  [phone]

## 2013-10-04 ENCOUNTER — Encounter: Payer: Self-pay | Admitting: Family Medicine

## 2013-10-06 ENCOUNTER — Telehealth: Payer: Self-pay

## 2013-10-06 DIAGNOSIS — I10 Essential (primary) hypertension: Secondary | ICD-10-CM

## 2013-10-06 NOTE — Telephone Encounter (Signed)
What would you like done? I can order it for you

## 2013-10-06 NOTE — Telephone Encounter (Signed)
Dr. Carlean Jews - Pt will be coming in June 25 for her recheck.  Can you please put in an order for her labs prior to the visit?  She usually comes in a week before her visit.  [phone]

## 2013-10-14 ENCOUNTER — Encounter (INDEPENDENT_AMBULATORY_CARE_PROVIDER_SITE_OTHER): Payer: Self-pay | Admitting: General Surgery

## 2013-11-03 ENCOUNTER — Other Ambulatory Visit: Payer: Self-pay | Admitting: Family Medicine

## 2013-11-21 ENCOUNTER — Other Ambulatory Visit: Payer: Self-pay | Admitting: Family Medicine

## 2013-11-21 ENCOUNTER — Other Ambulatory Visit: Payer: Self-pay | Admitting: Physician Assistant

## 2013-11-21 ENCOUNTER — Other Ambulatory Visit: Payer: Self-pay

## 2013-11-21 MED ORDER — FUROSEMIDE 20 MG PO TABS: ORAL_TABLET | ORAL | Status: DC

## 2013-11-21 NOTE — Telephone Encounter (Signed)
Dr L, you Rxd patches for pt in 12/2011 for foot pain, but not since that I see although you discussed foot pain at 08/07/13 OV. Do you want to RF or RTC for eval?

## 2013-11-24 ENCOUNTER — Telehealth: Payer: Self-pay | Admitting: *Deleted

## 2013-11-24 NOTE — Telephone Encounter (Signed)
Our fax machine is down.  [employer] calling to get prior authorization on Lidocaine 5% patches  Bin# [id number]

## 2013-11-26 NOTE — Telephone Encounter (Signed)
PA completed on covermymeds. Pt verified that she uses them for neuropaghic foot pain and has not tried gabapentin or Lyrica previously, but has been using patches w/good success for several years.

## 2013-11-27 ENCOUNTER — Encounter: Payer: Self-pay | Admitting: Family Medicine

## 2013-11-27 NOTE — Telephone Encounter (Signed)
I don't think that lyrica and gabapentin will be well tolerated.  They can be sedating.  If patient would like to try gabapentin at hs, I am willing to write an Rx.

## 2013-11-27 NOTE — Telephone Encounter (Signed)
PA denied d/t not being FDA approved for pt's Dx. Dr Joseph Art, do you want to try anything else for pt, such as the oral meds ins suggested: gabapentin or lyrica or have pt pay OOP for lidoderm patches?

## 2013-11-28 NOTE — Telephone Encounter (Signed)
LMOM for pt that PA was denied and that Dr L would be willing to Rx an oral med to take at night only because it is sedating. Asked pt to CB if she would like to try the medication.

## 2013-12-23 ENCOUNTER — Telehealth: Payer: Self-pay

## 2013-12-23 DIAGNOSIS — E119 Type 2 diabetes mellitus without complications: Secondary | ICD-10-CM

## 2013-12-23 NOTE — Telephone Encounter (Signed)
Pt states she has an appointment with dr Joseph Art on 01/01/14, states there should be orders in her chart for her to come in this week to have bloodwork done.  There is not an order for bloodwork, pt request we ask dr Joseph Art to put an order in so she can have that done this week

## 2013-12-24 NOTE — Telephone Encounter (Signed)
Patient called for the previous message,  She needs an order for A1C and her labs so she can talk about her labs with Dr. Carlean Jews.

## 2013-12-24 NOTE — Telephone Encounter (Signed)
Dr. Carlean Jews put in the orders for pt to have labs drawn. LM for pt advising labs ordered and hours she can stop by.

## 2013-12-25 ENCOUNTER — Other Ambulatory Visit: Payer: Medicare Other | Admitting: Family Medicine

## 2013-12-25 DIAGNOSIS — E119 Type 2 diabetes mellitus without complications: Secondary | ICD-10-CM

## 2013-12-25 LAB — COMPLETE METABOLIC PANEL WITH GFR
ALT: 14 U/L (ref 0–35)
AST: 14 U/L (ref 0–37)
Albumin: 3.9 g/dL (ref 3.5–5.2)
Alkaline Phosphatase: 87 U/L (ref 39–117)
BUN: 15 mg/dL (ref 6–23)
CO2: 24 mEq/L (ref 19–32)
Calcium: 9 mg/dL (ref 8.4–10.5)
Chloride: 104 mEq/L (ref 96–112)
Creat: 1.13 mg/dL — ABNORMAL HIGH (ref 0.50–1.10)
GFR, Est African American: 56 mL/min — ABNORMAL LOW
GFR, Est Non African American: 49 mL/min — ABNORMAL LOW
Glucose, Bld: 235 mg/dL — ABNORMAL HIGH (ref 70–99)
Potassium: 3.8 mEq/L (ref 3.5–5.3)
Sodium: 138 mEq/L (ref 135–145)
Total Bilirubin: 0.6 mg/dL (ref 0.2–1.2)
Total Protein: 5.9 g/dL — ABNORMAL LOW (ref 6.0–8.3)

## 2013-12-25 LAB — HEMOGLOBIN A1C
Hgb A1c MFr Bld: 9.7 % — ABNORMAL HIGH (ref <5.7)
Mean Plasma Glucose: 232 mg/dL — ABNORMAL HIGH (ref <117)

## 2014-01-01 ENCOUNTER — Encounter: Payer: Self-pay | Admitting: Family Medicine

## 2014-01-01 ENCOUNTER — Ambulatory Visit (INDEPENDENT_AMBULATORY_CARE_PROVIDER_SITE_OTHER): Payer: Medicare Other | Admitting: Family Medicine

## 2014-01-01 VITALS — BP 127/77 | HR 83 | Temp 98.0°F | Resp 16 | Ht 64.5 in | Wt 182.8 lb

## 2014-01-01 DIAGNOSIS — E1165 Type 2 diabetes mellitus with hyperglycemia: Secondary | ICD-10-CM

## 2014-01-01 DIAGNOSIS — R2681 Unsteadiness on feet: Secondary | ICD-10-CM

## 2014-01-01 DIAGNOSIS — E785 Hyperlipidemia, unspecified: Secondary | ICD-10-CM

## 2014-01-01 DIAGNOSIS — D6859 Other primary thrombophilia: Secondary | ICD-10-CM

## 2014-01-01 DIAGNOSIS — D6851 Activated protein C resistance: Secondary | ICD-10-CM

## 2014-01-01 DIAGNOSIS — R269 Unspecified abnormalities of gait and mobility: Secondary | ICD-10-CM

## 2014-01-01 DIAGNOSIS — E119 Type 2 diabetes mellitus without complications: Secondary | ICD-10-CM

## 2014-01-01 LAB — LIPID PANEL
Cholesterol: 183 mg/dL (ref 0–200)
HDL: 37 mg/dL — ABNORMAL LOW (ref >39)
Total CHOL/HDL Ratio: 4.9 Ratio
Triglycerides: 401 mg/dL — ABNORMAL HIGH (ref <150)

## 2014-01-01 LAB — MICROALBUMIN, URINE: Microalb, Ur: 0.5 mg/dL (ref 0.00–1.89)

## 2014-01-01 NOTE — Progress Notes (Signed)
[name] [name] is here for follow up from last week.  Her sugar was high and we need to talk about that and diabetes management in general.  She feels that she can do a better job with her diet.  She does not check her sugars regularly, nor does she want to.  She has a yearly vision check. No change in vision recently or hypoglycemic episodes.  She is being followed by Gaspar Cola for her factor V problem.    Her gait is somewhat unsteady, but no worse than it has been.  She does her exercises.  For some reason, her microalbumin and lipid profile were not run last week.  She had some cottage cheese this morning and a granola bar as well.  Objective: Gait is slightly wide based but steady Very fine action tremor of hands Results for orders placed in visit on 12/25/13  COMPLETE METABOLIC PANEL WITH GFR      Result Value Ref Range   Sodium 138  135 - 145 mEq/L   Potassium 3.8  3.5 - 5.3 mEq/L   Chloride 104  96 - 112 mEq/L   CO2 24  19 - 32 mEq/L   Glucose, Bld 235 (*) 70 - 99 mg/dL   BUN 15  6 - 23 mg/dL   Creat 1.13 (*) 0.50 - 1.10 mg/dL   Total Bilirubin 0.6  0.2 - 1.2 mg/dL   Alkaline Phosphatase 87  39 - 117 U/L   AST 14  0 - 37 U/L   ALT 14  0 - 35 U/L   Total Protein 5.9 (*) 6.0 - 8.3 g/dL   Albumin 3.9  3.5 - 5.2 g/dL   Calcium 9.0  8.4 - 10.5 mg/dL   GFR, Est African American 56 (*)    GFR, Est Non African American 49 (*)   HEMOGLOBIN A1C      Result Value Ref Range   Hemoglobin A1C 9.7 (*) <5.7 %   Mean Plasma Glucose 232 (*) <117 mg/dL   I spent roughly 30 minutes with patient outlining our plan:  3 month recheck, continue gait exercises,  Type 2 diabetes mellitus with hyperglycemia - Plan: POCT glycosylated hemoglobin (Hb A1C), Lipid panel, Microalbumin, urine  Unsteady gait  Hyperlipidemia  Factor V Leiden mutation  Signed, Robyn Haber, MD   Signed, Robyn Haber, MD

## 2014-01-13 ENCOUNTER — Other Ambulatory Visit: Payer: Self-pay | Admitting: Family Medicine

## 2014-02-10 ENCOUNTER — Other Ambulatory Visit: Payer: Self-pay | Admitting: Family Medicine

## 2014-02-11 NOTE — Telephone Encounter (Signed)
Dr Joseph Art, you saw pt in June, but don't see these meds addressed. Do you want to give RFs? I changed quantity on Lasix bc it was only for #90 but pt could get #180 w/sig 1-2 tabs QD. Please review.

## 2014-02-19 ENCOUNTER — Other Ambulatory Visit (INDEPENDENT_AMBULATORY_CARE_PROVIDER_SITE_OTHER): Payer: Self-pay

## 2014-02-19 MED ORDER — UNABLE TO FIND: Status: DC

## 2014-03-20 ENCOUNTER — Other Ambulatory Visit: Payer: Self-pay | Admitting: Family Medicine

## 2014-03-23 NOTE — Telephone Encounter (Signed)
Dr L, you saw pt recently for check up but don't see allergies discussed. Do you want to give RFs or RTC?

## 2014-04-02 ENCOUNTER — Other Ambulatory Visit: Payer: Self-pay | Admitting: Family Medicine

## 2014-04-02 ENCOUNTER — Other Ambulatory Visit (INDEPENDENT_AMBULATORY_CARE_PROVIDER_SITE_OTHER): Payer: Medicare Other

## 2014-04-02 DIAGNOSIS — E119 Type 2 diabetes mellitus without complications: Secondary | ICD-10-CM

## 2014-04-02 LAB — COMPLETE METABOLIC PANEL WITH GFR
ALT: 17 U/L (ref 0–35)
AST: 14 U/L (ref 0–37)
Albumin: 4 g/dL (ref 3.5–5.2)
Alkaline Phosphatase: 104 U/L (ref 39–117)
BUN: 24 mg/dL — ABNORMAL HIGH (ref 6–23)
CO2: 24 mEq/L (ref 19–32)
Calcium: 9.1 mg/dL (ref 8.4–10.5)
Chloride: 100 mEq/L (ref 96–112)
Creat: 1.09 mg/dL (ref 0.50–1.10)
GFR, Est African American: 59 mL/min — ABNORMAL LOW
GFR, Est Non African American: 51 mL/min — ABNORMAL LOW
Glucose, Bld: 249 mg/dL — ABNORMAL HIGH (ref 70–99)
Potassium: 3.9 mEq/L (ref 3.5–5.3)
Sodium: 138 mEq/L (ref 135–145)
Total Bilirubin: 0.5 mg/dL (ref 0.2–1.2)
Total Protein: 6.7 g/dL (ref 6.0–8.3)

## 2014-04-02 LAB — HEMOGLOBIN A1C
Hgb A1c MFr Bld: 9.8 % — ABNORMAL HIGH (ref <5.7)
Mean Plasma Glucose: 235 mg/dL — ABNORMAL HIGH (ref <117)

## 2014-04-02 NOTE — Progress Notes (Signed)
Patient here for lab work only 

## 2014-04-09 ENCOUNTER — Ambulatory Visit (INDEPENDENT_AMBULATORY_CARE_PROVIDER_SITE_OTHER): Payer: Medicare Other | Admitting: Family Medicine

## 2014-04-09 ENCOUNTER — Encounter: Payer: Self-pay | Admitting: Family Medicine

## 2014-04-09 ENCOUNTER — Ambulatory Visit: Payer: Medicare Other | Admitting: Radiology

## 2014-04-09 VITALS — BP 126/74 | HR 78 | Temp 98.0°F | Resp 16 | Ht 63.5 in | Wt 185.0 lb

## 2014-04-09 DIAGNOSIS — Z23 Encounter for immunization: Secondary | ICD-10-CM

## 2014-04-09 DIAGNOSIS — G609 Hereditary and idiopathic neuropathy, unspecified: Secondary | ICD-10-CM

## 2014-04-09 DIAGNOSIS — R2681 Unsteadiness on feet: Secondary | ICD-10-CM

## 2014-04-09 DIAGNOSIS — R350 Frequency of micturition: Secondary | ICD-10-CM

## 2014-04-09 DIAGNOSIS — E1142 Type 2 diabetes mellitus with diabetic polyneuropathy: Secondary | ICD-10-CM

## 2014-04-09 DIAGNOSIS — M2550 Pain in unspecified joint: Secondary | ICD-10-CM

## 2014-04-09 LAB — POCT UA - MICROSCOPIC ONLY
Bacteria, U Microscopic: NEGATIVE
Casts, Ur, LPF, POC: NEGATIVE
Crystals, Ur, HPF, POC: NEGATIVE
Mucus, UA: NEGATIVE
RBC, urine, microscopic: NEGATIVE
Yeast, UA: NEGATIVE

## 2014-04-09 LAB — POCT URINALYSIS DIPSTICK
Bilirubin, UA: NEGATIVE
Blood, UA: NEGATIVE
Glucose, UA: >=1000
Ketones, UA: NEGATIVE
Leukocytes, UA: NEGATIVE
Nitrite, UA: NEGATIVE
Protein, UA: NEGATIVE
Spec Grav, UA: <=1.005
Urobilinogen, UA: 0.2
pH, UA: 5

## 2014-04-09 MED ORDER — DICLOFENAC SODIUM 1 % TD GEL: 2.0000 g | Freq: Four times a day (QID) | TRANSDERMAL | Status: DC

## 2014-04-09 MED ORDER — CANAGLIFLOZIN 100 MG PO TABS: 100.0000 mg | ORAL_TABLET | Freq: Two times a day (BID) | ORAL | Status: DC

## 2014-04-09 NOTE — Patient Instructions (Signed)
Results for orders placed in visit on 04/02/14  COMPLETE METABOLIC PANEL WITH GFR      Result Value Ref Range   Sodium 138  135 - 145 mEq/L   Potassium 3.9  3.5 - 5.3 mEq/L   Chloride 100  96 - 112 mEq/L   CO2 24  19 - 32 mEq/L   Glucose, Bld 249 (*) 70 - 99 mg/dL   BUN 24 (*) 6 - 23 mg/dL   Creat 1.09  0.50 - 1.10 mg/dL   Total Bilirubin 0.5  0.2 - 1.2 mg/dL   Alkaline Phosphatase 104  39 - 117 U/L   AST 14  0 - 37 U/L   ALT 17  0 - 35 U/L   Total Protein 6.7  6.0 - 8.3 g/dL   Albumin 4.0  3.5 - 5.2 g/dL   Calcium 9.1  8.4 - 10.5 mg/dL   GFR, Est African American 59 (*)    GFR, Est Non African American 51 (*)   HEMOGLOBIN A1C      Result Value Ref Range   Hemoglobin A1C 9.8 (*) <5.7 %   Mean Plasma Glucose 235 (*) <117 mg/dL

## 2014-04-09 NOTE — Progress Notes (Addendum)
Patient ID: [name] [name] MRN: [id number], DOB: 06-16-1941, 73 y.o. Date of Encounter: 04/09/2014, 12:17 PM  Primary Physician: Robyn Haber, MD  Chief Complaint: Diabetes follow up  HPI: 73 y.o. year old female with history below presents for follow up of diabetes mellitus. Doing well. No issues or complaints. Taking medications daily without adverse effects. No polydipsia, polyphagia, polyuria, or nocturia.  Blood sugars at home:  Not checked  Patient frustrated with lack of diabetic control. She came off the metformin because she had azotemia. The glipizide has not done much in the way of controlling her blood sugars.  Exercising regularly.  T[employer] [place] [employer] dysesthesias in her feet. Lidoderm patches are no longer covered by her insurance. She's had I nerve conduction test in the past which is extremely painful did show some neuropathy. She does not want to have this test done again. She seen a neurologist in one to two weeks. Lab Results  Component Value Date   HGBA1C 9.8* 04/02/2014     Past Medical History  Diagnosis Date  . Hypertension   . Arthritis   . IBS (irritable bowel syndrome)   . Allergy   . Clotting disorder   . Diabetes mellitus   . Cancer     breast and skin     Home Meds: Prior to Admission medications   Medication Sig Start Date End Date Taking? Authorizing Provider  busPIRone (BUSPAR) 30 MG tablet TAKE 1 TABLET TWICE DAILY.   Yes Robyn Haber, MD  conjugated estrogens (PREMARIN) vaginal cream Place 1 Applicatorful vaginally daily. 08/07/13  Yes Robyn Haber, MD  diazepam (VALIUM) 5 MG tablet Take 0.5 tablets (2.5 mg total) by mouth at bedtime as needed. 03/27/13  Yes Robyn Haber, MD  fluticasone [facility]) 50 MCG/ACT nasal spray Place 2 sprays into the nose daily. 03/27/13  Yes Robyn Haber, MD  furosemide (LASIX) 20 MG tablet TAKE 1 OR 2 TABLETS DAILY AS DIRECTED.   Yes Robyn Haber, MD  glipiZIDE (GLUCOTROL XL) 5 MG 24 hr tablet Take  1 tablet (5 mg total) by mouth daily with breakfast. 08/10/13  Yes Robyn Haber, MD  lactobacillus acidophilus (BACID) TABS tablet Take 2 tablets by mouth 3 (three) times daily.   Yes Historical Provider, MD  LIDODERM 5 % APPLY ONE PATCH DAILY, 12 HOURS ON AND 12 HOURS OFF, AS NEEDED.   Yes Robyn Haber, MD  metoprolol succinate (TOPROL XL) 100 MG 24 hr tablet TAKE 1 TABLET TWICE DAILY WITH A MEAL 07/06/13  Yes Robyn Haber, MD  montelukast (SINGULAIR) 10 MG tablet TAKE ONE TABLET AT BEDTIME. 03/23/14  Yes Robyn Haber, MD  nystatin (MYCOSTATIN/NYSTOP) 100000 UNIT/GM POWD Apply 1 g topically 2 (two) times daily. 08/07/13  Yes Robyn Haber, MD  XIFAXAN 550 MG TABS tablet TAKE 1 TABLET TWICE DAILY.   Yes Mancel Bale, PA-C  Canagliflozin (INVOKANA) 100 MG TABS Take 1 tablet (100 mg total) by mouth 2 (two) times daily. 04/09/14   Robyn Haber, MD  diclofenac sodium (VOLTAREN) 1 % GEL Apply 2 g topically 4 (four) times daily. 04/09/14   Robyn Haber, MD  potassium chloride SA (K-DUR,KLOR-CON) 20 MEQ tablet Take 1 tablet (20 mEq total) by mouth daily. 03/25/13   Robyn Haber, MD  zaleplon (SONATA) 5 MG capsule Take 1 capsule (5 mg total) by mouth at bedtime. 03/27/13   Robyn Haber, MD    Allergies:  Allergies  Allergen Reactions  . Sulfa Drugs Cross Reactors Rash    All over the  body    History   Social History  . Marital Status: Married    Spouse Name: N/A    Number of Children: N/A  . Years of Education: N/A   Occupational History  . Not on file.   Social History Main Topics  . Smoking status: Never Smoker   . Smokeless tobacco: Never Used  . Alcohol Use: 0.0 oz/week     Comment: social  . Drug Use: No  . Sexual Activity: Not on file   Other Topics Concern  . Not on file   Social History Narrative  . No narrative on file     Lab Results  Component Value Date   HGBA1C 9.8* 04/02/2014    Review of Systems: Constitutional: negative for chills, fever,  night sweats, weight changes, or fatigue  HEENT: negative for vision changes, hearing loss, congestion, rhinorrhea, or epistaxis Cardiovascular: negative for chest pain, palpitations, diaphoresis, DOE, orthopnea, or edema Respiratory: negative for hemoptysis, wheezing, shortness of breath, dyspnea, or cough Abdominal: negative for abdominal pain, nausea, vomiting, diarrhea, or constipation Dermatological: negative for rash, erythema, or wounds Neurologic: negative for headache, dizziness, or syncope Renal:  Negative for  polydipsia, or dysuria All other systems reviewed and are otherwise negative with the exception to those above and in the HPI.   Physical Exam: Blood pressure 126/74, pulse 78, temperature 98 F (36.7 C), resp. rate 16, height 5' 3.5" (1.613 m), weight 185 lb (83.915 kg), SpO2 93.00%., Body mass index is 32.25 kg/(m^2). Wt Readings from Last 3 Encounters:  04/09/14 185 lb (83.915 kg)  01/01/14 182 lb 12.8 oz (82.918 kg)  08/07/13 184 lb 6.4 oz (83.643 kg)   BP Readings from Last 3 Encounters:  04/09/14 126/74  01/01/14 127/77  08/07/13 128/72   General: Well developed, well nourished, in no acute distress. Head: Normocephalic, atraumatic, eyes without discharge, sclera non-icteric, nares are without discharge. Bilateral auditory canals clear, TM's are without perforation, pearly grey and translucent with reflective cone of light bilaterally. Oral cavity moist, posterior pharynx without exudate, erythema, peritonsillar abscess, or post nasal drip.  Neck: Supple. No thyromegaly. Full ROM. No lymphadenopathy. Lungs: Clear bilaterally to auscultation without wheezes, rales, or rhonchi. Breathing is unlabored. Heart: RRR with S1 S2. No murmurs, rubs, or gallops appreciated. Abdomen: Soft, non-tender, non-distended with normoactive bowel sounds. No hepatosplenomegaly. No rebound/guarding. No obvious abdominal masses. Msk:  Strength and tone normal for age. Extremities/Skin:  Warm and dry. No clubbing or cyanosis. No edema. No rashes, wounds, or suspicious lesions..  Neuro: Alert and oriented X 3. Moves all extremities spontaneously. Marland Kitchen CNII-XII grossly in tact. Positive Rhomberg, neg dysmetria, wide-based gait Psych:  Responds to questions appropriately with a normal affect.   Labs: Results for orders placed in visit on 04/09/14  POCT URINALYSIS DIPSTICK      Result Value Ref Range   Color, UA yellow     Clarity, UA clear     Glucose, UA >=1000     Bilirubin, UA neg     Ketones, UA neg     Spec Grav, UA <=1.005     Blood, UA neg     pH, UA 5.0     Protein, UA neg     Urobilinogen, UA 0.2     Nitrite, UA neg     Leukocytes, UA Negative    POCT UA - MICROSCOPIC ONLY      Result Value Ref Range   WBC, Ur, HPF, POC 0-1     RBC,  urine, microscopic neg     Bacteria, U Microscopic neg     Mucus, UA neg     Epithelial cells, urine per micros 0-1     Crystals, Ur, HPF, POC neg     Casts, Ur, LPF, POC neg     Yeast, UA neg        ASSESSMENT AND PLAN:  73 y.o. year old female with  Urinary frequency - Plan: POCT urinalysis dipstick, POCT UA - Microscopic Only  Arthralgia - Plan: diclofenac sodium (VOLTAREN) 1 % GEL  Type 2 diabetes mellitus with diabetic polyneuropathy - Plan: Canagliflozin (INVOKANA) 100 MG TABS, CANCELED: Ambulatory referral to Neurology  Unstable gait - Plan: CANCELED: Ambulatory referral to Neurology  Hereditary and idiopathic peripheral neuropathy - Plan: Ambulatory referral to Neurology   -  Signed, Robyn Haber, MD 04/09/2014 12:17 PM

## 2014-04-10 ENCOUNTER — Telehealth: Payer: Self-pay

## 2014-04-10 NOTE — Telephone Encounter (Signed)
PA is needed for voltaren gel. Dr L, I see that you Rxd this for pt's arthralgias. I am trying to complete the PA form and answer the copied question. Can you please address this question for me. If answer is any "other" Dx, voltaren will probably not be covered. Is voltaren gel being Rxd for     1. 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 Other diagnosis

## 2014-04-10 NOTE — Telephone Encounter (Signed)
PA needed for Invokana. Completed on covermymeds. Prev tried meds: Metformin caused azotemia, glipizide was ineffective at controlling BS. Pending.

## 2014-04-11 NOTE — Telephone Encounter (Signed)
Foot inflammation with tendonitis

## 2014-04-14 NOTE — Telephone Encounter (Signed)
PA approved through 07/10/15. Notified pharm. 

## 2014-04-15 NOTE — Telephone Encounter (Signed)
Completed form on covermymeds. Pending.

## 2014-04-16 NOTE — Telephone Encounter (Signed)
Voltaren gel has been denied. Please advise what you would like to prescribe instead.

## 2014-04-17 NOTE — Telephone Encounter (Signed)
She can get med and pay for it, or I can refer her to specialist

## 2014-04-17 NOTE — Telephone Encounter (Signed)
Notified pt and she stated she will call around to check prices to pay OOP and/or try some OTC creams/patches.

## 2014-04-22 ENCOUNTER — Ambulatory Visit (INDEPENDENT_AMBULATORY_CARE_PROVIDER_SITE_OTHER): Payer: Medicare Other | Admitting: Internal Medicine

## 2014-04-22 ENCOUNTER — Encounter: Payer: Self-pay | Admitting: Internal Medicine

## 2014-04-22 VITALS — BP 132/78 | HR 80 | Temp 97.7°F | Resp 12 | Ht 64.5 in | Wt 182.0 lb

## 2014-04-22 DIAGNOSIS — E1165 Type 2 diabetes mellitus with hyperglycemia: Secondary | ICD-10-CM

## 2014-04-22 DIAGNOSIS — E1121 Type 2 diabetes mellitus with diabetic nephropathy: Secondary | ICD-10-CM

## 2014-04-22 DIAGNOSIS — IMO0002 Reserved for concepts with insufficient information to code with codable children: Secondary | ICD-10-CM

## 2014-04-22 DIAGNOSIS — E1129 Type 2 diabetes mellitus with other diabetic kidney complication: Secondary | ICD-10-CM

## 2014-04-22 MED ORDER — NONFORMULARY OR COMPOUNDED ITEM: Status: DC

## 2014-04-22 MED ORDER — METFORMIN HCL 500 MG PO TABS: 1000.0000 mg | ORAL_TABLET | Freq: Two times a day (BID) | ORAL | Status: DC

## 2014-04-22 NOTE — Patient Instructions (Addendum)
Continue Invokana 100 mg in am. Please start Metformin 500 mg with dinner x 4 days. If you tolerate this well, add another Metformin tablet (500 mg) with breakfast x 4 days. If you tolerate this well, add another metformin tablet with dinner (total 1000 mg) x 4 days. If you tolerate this well, add another metformin tablet with breakfast (total 1000 mg). Continue with 1000 mg of metformin 2x a day with breakfast and dinner.  Please start checking sugars 1-2x a day, rotating check times. Please return in 1 month with your sugar log.   Please start the following neuropathy cream: Amitriptyline 2%, Lidocaine 2%, and Ketamine 1% - apply on feet 2x a day  PATIENT INSTRUCTIONS FOR TYPE 2 DIABETES:  DIET AND EXERCISE Diet and exercise is an important part of diabetic treatment.  We recommended aerobic exercise in the form of brisk walking (working between 40-60% of maximal aerobic capacity, similar to brisk walking) for 150 minutes per week (such as 30 minutes five days per week) along with 3 times per week performing 'resistance' training (using various gauge rubber tubes with handles) 5-10 exercises involving the major muscle groups (upper body, lower body and core) performing 10-15 repetitions (or near fatigue) each exercise. Start at half the above goal but build slowly to reach the above goals. If limited by weight, joint pain, or disability, we recommend daily walking in a swimming pool with water up to waist to reduce pressure from joints while allow for adequate exercise.    BLOOD GLUCOSES Monitoring your blood glucoses is important for continued management of your diabetes. Please check your blood glucoses 2-4 times a day: fasting, before meals and at bedtime (you can rotate these measurements - e.g. one day check before the 3 meals, the next day check before 2 of the meals and before bedtime, etc.).   HYPOGLYCEMIA (low blood sugar) Hypoglycemia is usually a reaction to not eating, exercising, or  taking too much insulin/ other diabetes drugs.  Symptoms include tremors, sweating, hunger, confusion, headache, etc. Treat IMMEDIATELY with 15 grams of Carbs:   4 glucose tablets    cup regular juice/soda   2 tablespoons raisins   4 teaspoons sugar   1 tablespoon honey Recheck blood glucose in 15 mins and repeat above if still symptomatic/blood glucose <100.  RECOMMENDATIONS TO REDUCE YOUR RISK OF DIABETIC COMPLICATIONS: * Take your prescribed MEDICATION(S) * Follow a DIABETIC diet: Complex carbs, fiber rich foods, (monounsaturated and polyunsaturated) fats * AVOID saturated/trans fats, high fat foods, >2,300 mg salt per day. * EXERCISE at least 5 times a week for 30 minutes or preferably daily.  * DO NOT SMOKE OR DRINK more than 1 drink a day. * Check your FEET every day. Do not wear tightfitting shoes. Contact us if you develop an ulcer * See your EYE doctor once a year or more if needed * Get a FLU shot once a year * Get a PNEUMONIA vaccine once before and once after age 31 years  GOALS:  * Your Hemoglobin A1c of <7%  * fasting sugars need to be <130 * after meals sugars need to be <180 (2h after you start eating) * Your Systolic BP should be 962 or lower  * Your Diastolic BP should be 80 or lower  * Your HDL (Good Cholesterol) should be 40 or higher  * Your LDL (Bad Cholesterol) should be 100 or lower. * Your Triglycerides should be 150 or lower  * Your Urine microalbumin (kidney function) should  be <30 * Your Body Mass Index should be 25 or lower

## 2014-04-22 NOTE — Progress Notes (Signed)
Patient ID: [name], female   DOB: [birth year], 73 y.o.   MRN: [id number]  HPI: [name] [name] is a 73 y.o.-year-old female, referred by her PCP, Dr. Joseph Art, for management of DM2, non-insulin-dependent, uncontrolled, with complications (mild CKD).  Patient has been diagnosed with diabetes in 2014; she has not been on insulin before.   Last hemoglobin A1c was: Lab Results  Component Value Date   HGBA1C 9.8* 04/02/2014   HGBA1C 9.7* 12/25/2013   HGBA1C 7.1* 09/12/2012   Pt is on a regimen of: - Invokana 100 mg in am - started 1.5 weeks ago She stopped Glipizide XL 5 mg in am, stopped when started Invokana. She was on Metformin.  Pt does check her sugars at home. Does not have a meter. ? Lows or highs.  ? hypoglycemia awareness at 70.   Pt's meals are: - Breakfast: apple + cottage cheese + whole wheat - Lunch: leftovers from dinner; sandwich; salad - Dinner: varies; some fast food - Snacks: veggies; chips Some diet sodas.  She [facility], Tai Chi 1x a week.  - + CKD, last BUN/creatinine:  Lab Results  Component Value Date   BUN 24* 04/02/2014   CREATININE 1.09 04/02/2014  Not on an ACEI. - last set of lipids: Lab Results  Component Value Date   CHOL 183 01/01/2014   HDL 37* 01/01/2014   LDLCALC NOT CALC 01/01/2014   TRIG 401* 01/01/2014   CHOLHDL 4.9 01/01/2014  Not on a statin. - last eye exam was in 02/2014. No DR.  - + numbness and tingling in her feet. She tells me she had PN long before she was dx with DM. She used Lidocaine patches >> discontinued.  No FH of DM.  ROS: Constitutional: no weight gain/loss, + fatigue, no subjective hyperthermia/hypothermia, +poor sleep Eyes: no blurry vision, no xerophthalmia ENT: no sore throat, no nodules palpated in throat, no dysphagia/odynophagia, no hoarseness Cardiovascular: no CP/SOB/palpitations/leg swelling Respiratory: no cough/SOB Gastrointestinal: no N/V/+ D - IBS/no C/+  heartburn Musculoskeletal: + both: muscle/joint aches Skin: no rashes, + hair loss Neurological: no tremors/+ numbness/+ tingling - feet/dizziness Psychiatric: no depression/anxiety  Past Medical History  Diagnosis Date  . Hypertension   . Arthritis   . IBS (irritable bowel syndrome)   . Allergy   . Clotting disorder   . Diabetes mellitus   . Cancer     breast and skin   Past Surgical History  Procedure Laterality Date  . Mastectomy  2002    Bilateral  . Breast lumpectomy  1996  . Abdominal hysterectomy  1985   History   Social History  . Marital Status: Married    Spouse Name: N/A    Number of Children: 2   Occupational History  . Retired from [employer]  . Smoking status: Never Smoker   . Smokeless tobacco: Never Used  . Alcohol Use: 0.0 oz/week     Comment: social  . Drug Use: No   Current Outpatient Prescriptions on File Prior to Visit  Medication Sig Dispense Refill  . busPIRone (BUSPAR) 30 MG tablet TAKE 1 TABLET TWICE DAILY.  180 tablet  1  . Canagliflozin (INVOKANA) 100 MG TABS Take 1 tablet (100 mg total) by mouth 2 (two) times daily.  60 tablet  11  . conjugated estrogens (PREMARIN) vaginal cream Place 1 Applicatorful vaginally daily.  42.5 g  12  . diazepam (VALIUM) 5  MG tablet Take 0.5 tablets (2.5 mg total) by mouth at bedtime as needed.  30 tablet  5  . diclofenac sodium (VOLTAREN) 1 % GEL Apply 2 g topically 4 (four) times daily.  100 g  6  . fluticasone (FLONASE) 50 MCG/ACT nasal spray Place 2 sprays into the nose daily.  16 g  11  . furosemide (LASIX) 20 MG tablet TAKE 1 OR 2 TABLETS DAILY AS DIRECTED.  180 tablet  1  . lactobacillus acidophilus (BACID) TABS tablet Take 2 tablets by mouth 3 (three) times daily.      Marland Kitchen LIDODERM 5 % APPLY ONE PATCH DAILY, 12 HOURS ON AND 12 HOURS OFF, AS NEEDED.  30 patch  1  . metoprolol succinate (TOPROL XL) 100 MG 24 hr tablet TAKE 1 TABLET TWICE DAILY WITH A MEAL      . montelukast  (SINGULAIR) 10 MG tablet TAKE ONE TABLET AT BEDTIME.  90 tablet  1  . nystatin (MYCOSTATIN/NYSTOP) 100000 UNIT/GM POWD Apply 1 g topically 2 (two) times daily.  60 g  3  . potassium chloride SA (K-DUR,KLOR-CON) 20 MEQ tablet Take 1 tablet (20 mEq total) by mouth daily.  30 tablet  3  . XIFAXAN 550 MG TABS tablet TAKE 1 TABLET TWICE DAILY.  60 tablet  1  . zaleplon (SONATA) 5 MG capsule Take 1 capsule (5 mg total) by mouth at bedtime.  30 capsule  5   No current facility-administered medications on file prior to visit.   Allergies  Allergen Reactions  . Sulfa Drugs Cross Reactors Rash    All over the body   Family History  Problem Relation Age of Onset  . Heart disease Brother   . Heart disease Mother   . Heart disease Father    PE: BP 132/78  Pulse 80  Temp(Src) 97.7 F (36.5 C) (Oral)  Resp 12  Ht 5' 4.5" (1.638 m)  Wt 182 lb (82.555 kg)  BMI 30.77 kg/m2  SpO2 94% Wt Readings from Last 3 Encounters:  04/22/14 182 lb (82.555 kg)  04/09/14 185 lb (83.915 kg)  01/01/14 182 lb 12.8 oz (82.918 kg)   Constitutional: overweight, in NAD Eyes: PERRLA, EOMI, no exophthalmos ENT: moist mucous membranes, no thyromegaly, no cervical lymphadenopathy Cardiovascular: RRR, No MRG Respiratory: CTA B Gastrointestinal: abdomen soft, NT, ND, BS+ Musculoskeletal: no deformities, strength intact in all 4 Skin: moist, warm, no rashes Neurological: + tremor with outstretched hands, DTR normal in all 4  ASSESSMENT: 1. DM2, non-insulin-dependent, uncontrolled, with complications - mild CKD  2. 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 Patient with uncontrolled diabetes, on oral antidiabetic regimen, which became insufficient.  - We discussed about options for treatment, and I suggested to:  Patient Instructions  Continue Invokana 100 mg in am. Please start Metformin 500 mg with dinner x 4 days. If you tolerate this well, add another Metformin tablet (500 mg) with breakfast x 4  days. If you tolerate this well, add another metformin tablet with dinner (total 1000 mg) x 4 days. If you tolerate this well, add another metformin tablet with breakfast (total 1000 mg). Continue with 1000 mg of metformin 2x a day with breakfast and dinner.  Please start checking sugars 1-2x a day, rotating check times. Please return in 1 month with your sugar log.   - we gave her a FreeStyle meter and demonstrated use - she prefers to check sugars in alternative sites - Strongly advised her to start  checking sugars at different times of the day - check 1-2 times a day, rotating checks - given sugar log and advised how to fill it and to bring it at next appt  - given foot care handout and explained the principles  - given instructions for hypoglycemia management "15-15 rule"  - advised for yearly eye exams - she is up to date - she had the flu vaccine this season - CBG in the office: 451! - Return to clinic in 1 mo with sugar log   2. PN - advised to start the following neuropathy cream: Amitriptyline 2%, Lidocaine 2%, and Ketamine 1% - apply on feet 2x a day - Rx printed for her

## 2014-04-23 ENCOUNTER — Other Ambulatory Visit: Payer: Self-pay | Admitting: *Deleted

## 2014-04-23 MED ORDER — FREESTYLE LANCETS MISC: Status: DC

## 2014-04-23 NOTE — Telephone Encounter (Signed)
Pt called requesting rx for lancets to use with her new meter, Freestyle Lite. Pt asked how to remove the used lancet from the device. Walked her through the process. Pt understood.

## 2014-05-05 NOTE — Telephone Encounter (Signed)
Received fax from [facility] stating they approved appeal for coverage of voltaren gel through 04/28/15.

## 2014-05-05 NOTE — Telephone Encounter (Signed)
Notified pt of approval and she had been notified as well and has p/up the Rx. She stated that the voltaren is working better than anything else she has tried and she wanted to thank Dr L for the appeal. Dr Carlean Jews, Jul[place] Rainier.

## 2014-05-11 ENCOUNTER — Other Ambulatory Visit: Payer: Self-pay | Admitting: Internal Medicine

## 2014-05-13 ENCOUNTER — Telehealth: Payer: Self-pay | Admitting: Internal Medicine

## 2014-05-13 DIAGNOSIS — C4490 Unspecified malignant neoplasm of skin, unspecified: Secondary | ICD-10-CM | POA: Insufficient documentation

## 2014-05-13 NOTE — Telephone Encounter (Signed)
Per [name] [name] Patient stated that her medication Invokana or either the Metformin is causing her low grade nausea, Please advise on what to do

## 2014-05-14 NOTE — Telephone Encounter (Signed)
Called pt and advised her per Dr Arman Filter note. Pt understood. Will call back on Monday to let us know how she is feeling.

## 2014-05-14 NOTE — Telephone Encounter (Signed)
Metformin was started last, so let's try to decrease the dose to 500 mg bid (I believe she is now taking 1000 mg bid, as I advised her so at last visit). She can also stop for few days and restart one Metformin tab at a time for few days. Increase as tolerated.

## 2014-05-19 ENCOUNTER — Encounter: Payer: Self-pay | Admitting: *Deleted

## 2014-05-20 ENCOUNTER — Encounter: Payer: Self-pay | Admitting: Neurology

## 2014-05-20 ENCOUNTER — Ambulatory Visit (INDEPENDENT_AMBULATORY_CARE_PROVIDER_SITE_OTHER): Payer: Medicare Other | Admitting: Neurology

## 2014-05-20 VITALS — BP 134/80 | HR 104 | Ht 64.0 in | Wt 174.0 lb

## 2014-05-20 DIAGNOSIS — E1365 Other specified diabetes mellitus with hyperglycemia: Secondary | ICD-10-CM

## 2014-05-20 DIAGNOSIS — IMO0002 Reserved for concepts with insufficient information to code with codable children: Secondary | ICD-10-CM

## 2014-05-20 DIAGNOSIS — E1349 Other specified diabetes mellitus with other diabetic neurological complication: Secondary | ICD-10-CM

## 2014-05-20 DIAGNOSIS — R2681 Unsteadiness on feet: Secondary | ICD-10-CM

## 2014-05-20 DIAGNOSIS — G63 Polyneuropathy in diseases classified elsewhere: Secondary | ICD-10-CM

## 2014-05-20 DIAGNOSIS — E1142 Type 2 diabetes mellitus with diabetic polyneuropathy: Secondary | ICD-10-CM

## 2014-05-20 NOTE — Progress Notes (Signed)
NEUROLOGY CONSULTATION NOTE  [name] [name] MRN: [id number] DOB: 1941-03-06  Referring provider: Dr. Joseph Art Primary care provider: Dr. Joseph Art  Reason for consult:  Neuropathy  HISTORY OF PRESENT ILLNESS: [name] [name] is a 73 year old right-handed woman with uncontrolled type II diabetes mellitus, hypertension, arthritis, IBS, breast cancer, and Factor V Leiden gene mutation who presents for neuropathy.  Outside records and labs reviewed.  She has a history on uncontrolled type II diabetes.  Her most recent Hgb A1c from 04/02/14 was 9.8.  She takes Invokana, metformin.  She reportedly had a NCV-EMG in the past, but does not remember results.  TSH from 03/24/13 was 3.263 and B12 from 04/19/12 was 1079.    She has a history of progressed balance problems for at least two years.  She first noticed it since she tripped while carrying groceries.  Her legs feel wobbly but not like they will give out.  She denies not being able to feel the ground well.  When she walks upstairs, she feels like she will fall backwards, but she has never had any other falls.  She denies any dizziness.  She was evaluated by Dr. Neena Rhymes, a neurologist at [facility], in 2013.  MRI of the brain with and without contrast and MRA of the head and neck were unremarkable.  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 She underwent PT which she found helpful.  Currently, she does Tai Chi.  She has had burning pain in the feet for many years.  She has tried Lidoderm 5% patches, but her insurance company no longer approved it.  She then tried a neuropathy cream (amitriptyline 2%, lidocaine 2% and ketamine 1%), which was ineffective.  She currently uses Voltaren gel, which is helpful but limited.  She is not interested in trying an oral neuropathic pain reliever, such as gabapentin or Lyrica.  Over the years she feels that the gait instability has been stable, but  she would like to see if it can improve any further.  PAST MEDICAL HISTORY: Past Medical History  Diagnosis Date  . Hypertension   . Arthritis   . IBS (irritable bowel syndrome)   . Allergy   . Clotting disorder   . Diabetes mellitus   . Cancer     breast and skin    PAST SURGICAL HISTORY: Past Surgical History  Procedure Laterality Date  . Mastectomy  2002    Bilateral  . Breast lumpectomy  1996  . Abdominal hysterectomy  1985    MEDICATIONS: Current Outpatient Prescriptions on File Prior to Visit  Medication Sig Dispense Refill  . azelastine (ASTELIN) 0.1 % nasal spray Place 1 spray into both nostrils 2 (two) times daily.     . busPIRone (BUSPAR) 30 MG tablet TAKE 1 TABLET TWICE DAILY. 180 tablet 1  . Canagliflozin (INVOKANA) 100 MG TABS Take 1 tablet (100 mg total) by mouth 2 (two) times daily. 60 tablet 11  . conjugated estrogens (PREMARIN) vaginal cream Place 1 Applicatorful vaginally daily. 42.5 g 12  . diazepam (VALIUM) 5 MG tablet Take 0.5 tablets (2.5 mg total) by mouth at bedtime as needed. 30 tablet 5  . diclofenac sodium (VOLTAREN) 1 % GEL Apply 2 g topically 4 (four) times daily. 100 g 6  . fluticasone (FLONASE) 50 MCG/ACT nasal spray Place 2 sprays into the nose daily. 16 g 11  . FREESTYLE LITE test strip CHECK BLOOD SUGAR TWICE DAILY. 100 each 11  .  furosemide (LASIX) 20 MG tablet TAKE 1 OR 2 TABLETS DAILY AS DIRECTED. 180 tablet 1  . lactobacillus acidophilus (BACID) TABS tablet Take 2 tablets by mouth 3 (three) times daily.    . Lancets (FREESTYLE) lancets Use to test blood sugar 2 times daily as instructed. Dx code: E11.29 100 each 11  . metFORMIN (GLUCOPHAGE) 500 MG tablet Take 2 tablets (1,000 mg total) by mouth 2 (two) times daily with a meal. 120 tablet 3  . metoprolol succinate (TOPROL XL) 100 MG 24 hr tablet TAKE 1 TABLET TWICE DAILY WITH A MEAL    . montelukast (SINGULAIR) 10 MG tablet TAKE ONE TABLET AT BEDTIME. 90 tablet 1  . NONFORMULARY OR  COMPOUNDED ITEM Neuropathy cream: mix Amitriptyline 2%, Lidocaine 2%, and Ketamine 1% - apply on feet 2x a day 1 each prn  . nystatin (MYCOSTATIN/NYSTOP) 100000 UNIT/GM POWD Apply 1 g topically 2 (two) times daily. 60 g 3  . potassium chloride SA (K-DUR,KLOR-CON) 20 MEQ tablet Take 1 tablet (20 mEq total) by mouth daily. 30 tablet 3  . XIFAXAN 550 MG TABS tablet TAKE 1 TABLET TWICE DAILY. 60 tablet 1  . zaleplon (SONATA) 5 MG capsule Take 1 capsule (5 mg total) by mouth at bedtime. 30 capsule 5  . LIDODERM 5 % APPLY ONE PATCH DAILY, 12 HOURS ON AND 12 HOURS OFF, AS NEEDED. 30 patch 1   No current facility-administered medications on file prior to visit.    ALLERGIES: Allergies  Allergen Reactions  . Sulfa Drugs Cross Reactors Rash    All over the body    FAMILY HISTORY: Family History  Problem Relation Age of Onset  . Heart disease Brother   . Heart disease Mother   . Heart disease Father     SOCIAL HISTORY: History   Social History  . Marital Status: Married    Spouse Name: N/A    Number of Children: N/A  . Years of Education: N/A   Occupational History  . Not on file.   Social History Main Topics  . Smoking status: Never Smoker   . Smokeless tobacco: Never Used  . Alcohol Use: 0.0 oz/week     Comment: social  . Drug Use: No  . Sexual Activity: Not on file   Other Topics Concern  . Not on file   Social History Narrative    REVIEW OF SYSTEMS: Constitutional: No fevers, chills, or sweats, no generalized fatigue, change in appetite Eyes: No visual changes, double vision, eye pain Ear, nose and throat: No hearing loss, ear pain, nasal congestion, sore throat Cardiovascular: No chest pain, palpitations Respiratory:  No shortness of breath at rest or with exertion, wheezes GastrointestinaI: No nausea, vomiting, diarrhea, abdominal pain, fecal incontinence Genitourinary:  No dysuria, urinary retention or frequency Musculoskeletal:  No neck pain, back  pain Integumentary: No rash, pruritus, skin lesions Neurological: as above Psychiatric: No depression, insomnia, anxiety Endocrine: No palpitations, fatigue, diaphoresis, mood swings, change in appetite, change in weight, increased thirst Hematologic/Lymphatic:  No anemia, purpura, petechiae. Allergic/Immunologic: no itchy/runny eyes, nasal congestion, recent allergic reactions, rashes  PHYSICAL EXAM: Filed Vitals:   05/20/14 1449  BP: 134/80  Pulse: 104   General: No acute distress Head:  Normocephalic/atraumatic Eyes:  fundi not visualized CN III, IV, VI:  full range of motion, no nystagmus, no ptosis Neck: supple, no paraspinal tenderness, full range of motion Back: No paraspinal tenderness Heart: regular rate and rhythm Lungs: Clear to auscultation bilaterally. Vascular: No carotid bruits. Neurological Exam:  Mental status: alert and oriented to person, place, and time, recent and remote memory intact, fund of knowledge intact, attention and concentration intact, speech fluent and not dysarthric, language intact. Cranial nerves: CN I: not tested CN II: pupils equal, round and reactive to light, visual fields intact, fundi not visualized CN III, IV, VI:  full range of motion, no nystagmus, no ptosis CN V: facial sensation intact CN VII: upper and lower face symmetric CN VIII: hearing intact CN IX, X: gag intact, uvula midline CN XI: sternocleidomastoid and trapezius muscles intact CN XII: tongue midline Bulk & Tone: normal, no fasciculations. Motor:  5/5 throughout Sensation:  Reduced pinprick in the feet with reduced vibration sensation to below the knees. Deep Tendon Reflexes:  2+ throughout, except 1+ in patellars and absent in the ankles. Finger to nose testing:   No dysmetria Heel to shin:  No dysmetria Gait:  Wide-based gait, unsteady.  Able to walk on toes and heels.  Difficulty with tandem walking. Romberg with sway.  IMPRESSION: Gait instability, primarily  related to diabetic polyneuropathy Diabetic polyneuropathy Uncontrolled type II diabetes melllitus  PLAN: 1.  I think it would be reasonable to try PT again to see if it would give her a jump start in further improving gait.  However, I told her that any further improvement would likely be limited 2.  She is not interested in any oral medications.  She will continue Voltaren gel for now. 3.  Attempt to improve diabetes 4.  Follow up as needed.  Thank you for allowing me to take part in the care of this patient.  Metta Clines, DO  CC: Robyn Haber, MD

## 2014-05-20 NOTE — Patient Instructions (Signed)
You will have to call your insurance company to see how often they approve physical therapy.  Contact us with for the referral.

## 2014-05-21 ENCOUNTER — Encounter: Payer: Self-pay | Admitting: Internal Medicine

## 2014-05-21 ENCOUNTER — Other Ambulatory Visit: Payer: Self-pay | Admitting: *Deleted

## 2014-05-21 ENCOUNTER — Telehealth: Payer: Self-pay | Admitting: *Deleted

## 2014-05-21 ENCOUNTER — Ambulatory Visit (INDEPENDENT_AMBULATORY_CARE_PROVIDER_SITE_OTHER): Payer: Medicare Other | Admitting: Internal Medicine

## 2014-05-21 VITALS — BP 112/68 | HR 75 | Temp 97.5°F | Resp 12 | Wt 179.8 lb

## 2014-05-21 DIAGNOSIS — E1121 Type 2 diabetes mellitus with diabetic nephropathy: Secondary | ICD-10-CM

## 2014-05-21 DIAGNOSIS — E1129 Type 2 diabetes mellitus with other diabetic kidney complication: Secondary | ICD-10-CM

## 2014-05-21 DIAGNOSIS — E1165 Type 2 diabetes mellitus with hyperglycemia: Secondary | ICD-10-CM

## 2014-05-21 DIAGNOSIS — IMO0002 Reserved for concepts with insufficient information to code with codable children: Secondary | ICD-10-CM

## 2014-05-21 LAB — BASIC METABOLIC PANEL
BUN: 21 mg/dL (ref 6–23)
CO2: 19 mEq/L (ref 19–32)
Calcium: 9.5 mg/dL (ref 8.4–10.5)
Chloride: 104 mEq/L (ref 96–112)
Creatinine, Ser: 1.3 mg/dL — ABNORMAL HIGH (ref 0.4–1.2)
GFR: 41.58 mL/min — ABNORMAL LOW (ref >60.00)
Glucose, Bld: 260 mg/dL — ABNORMAL HIGH (ref 70–99)
Potassium: 3.6 mEq/L (ref 3.5–5.1)
Sodium: 138 mEq/L (ref 135–145)

## 2014-05-21 MED ORDER — GLIPIZIDE 5 MG PO TABS: 5.0000 mg | ORAL_TABLET | Freq: Two times a day (BID) | ORAL | Status: DC

## 2014-05-21 NOTE — Patient Instructions (Signed)
Please continue: - Invokana 100 mg in am  Increase: - metformin to 500 mg 3x a day, with meals Add: - Glipizide 5 mg 2x a day, before breakfast and dinner  Please let me know in 2 weeks if the sugars do not improve.  Please come back for a follow-up appointment in 1 month.  Please stop at the lab.

## 2014-05-21 NOTE — Progress Notes (Signed)
Patient ID: [name] [name], female   DOB: 1940/08/08, 73 y.o.   MRN: [id number]  HPI: [name] [name] is a 73 y.o.-year-old female, returning for follow-up for DM2, dx 2014, non-insulin-dependent, uncontrolled, with complications (mild CKD). Last visit a month ago.  Last hemoglobin A1c was: Lab Results  Component Value Date   HGBA1C 9.8* 04/02/2014   HGBA1C 9.7* 12/25/2013   HGBA1C 7.1* 09/12/2012   Pt is on a regimen of: - Invokana 100 mg in am - started 04/2014 - metformin 500 mg bid - started 04/2014  She stopped Glipizide XL 5 mg in am when started Invokana.  We gave pt a glucometer at last visit and she started to check her sugars 1xa day: - A.m.: 409-811 (later higher after reducing metformin) - 2 hours post breakfast: 264 - Before lunch: n/c - 2 hours post lunch: n/c - before dinner: n/c - 2 hours post dinner: n/c - Bedtime: n/c Highest sugar: 264 Lower sugar: 150  Pt's meals are: - Breakfast: apple + cottage cheese + whole wheat - Lunch: leftovers from dinner; sandwich; salad - Dinner: varies; some fast food - Snacks: veggies; chips Some diet sodas.  [facility], Tai Chi 1x a week.  - + CKD, last BUN/creatinine:  Lab Results  Component Value Date   BUN 24* 04/02/2014   CREATININE 1.09 04/02/2014  Not on an ACEI. - last set of lipids: Lab Results  Component Value Date   CHOL 183 01/01/2014   HDL 37* 01/01/2014   LDLCALC NOT CALC 01/01/2014   TRIG 401* 01/01/2014   CHOLHDL 4.9 01/01/2014  Not on a statin. - last eye exam was in 02/2014. No DR.  - + numbness and tingling in her feet. She tells me she had PN long before she was dx with DM. She used Lidocaine patches >> discontinued. We started PN cream at last visit, but this was expensive (93$) and did not work much. She mentions the Voltaren gel works a little better.  I reviewed pt's medications, allergies, PMH, social hx, family hx and no changes required,  except as mentioned above.  ROS: Constitutional: no weight gain/loss, no fatigue, no subjective hyperthermia/hypothermia Eyes: no blurry vision, no xerophthalmia ENT: no sore throat, no nodules palpated in throat, no dysphagia/odynophagia, no hoarseness Cardiovascular: no CP/SOB/palpitations/leg swelling Respiratory: no cough/SOB Gastrointestinal: + N/no V/D/C/+ heartburn Musculoskeletal: no muscle/joint aches Skin: no rashes, no hair loss Neurological: no tremors/+ numbness/+ tingling - feet/no dizziness  PE: BP 112/68 mmHg  Pulse 75  Temp(Src) 97.5 F (36.4 C) (Oral)  Resp 12  Wt 179 lb 12.8 oz (81.557 kg)  SpO2 95% Wt Readings from Last 3 Encounters:  05/21/14 179 lb 12.8 oz (81.557 kg)  05/20/14 174 lb (78.926 kg)  04/22/14 182 lb (82.555 kg)   Constitutional: overweight, in NAD Eyes: PERRLA, EOMI, no exophthalmos ENT: moist mucous membranes, no thyromegaly, no cervical lymphadenopathy Cardiovascular: RRR, No MRG Respiratory: CTA B Gastrointestinal: abdomen soft, NT, ND, BS+ Musculoskeletal: no deformities, strength intact in all 4 Skin: moist, warm, no rashes Neurological: + tremor with outstretched hands, DTR normal in all 4  ASSESSMENT: 1. DM2, non-insulin-dependent, uncontrolled, with complications - mild CKD  2. 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 Patient with uncontrolled diabetes, on oral antidiabetic regimen with Invokana and Metformin, with still high sugars especially after reducing Metformin. I advised her to try to take 500 mg tid of Metformin  and also add Glipizide. I again suggested basal Insulin, which she refuses. Patient Instructions  Please continue: - Invokana 100 mg in am  Increase: - metformin to 500 mg 3x a day, with meals Add: - Glipizide 5 mg 2x a day, before breakfast and dinner  Please let me know in 2 weeks if the sugars do not improve.  Please come back for a follow-up appointment in 1 month.  Please stop at the  lab.  - she prefers to check sugars in alternative sites - continue checking sugars at different times of the day - check 1-2 times a day, rotating checks - advised for yearly eye exams - she is up to date - she had the flu vaccine this season - will check BMP since on Invokana  - Return to clinic in 1 mo with sugar log   2. PN - tried the neuropathy cream: Amitriptyline 2%, Lidocaine 2%, and Ketamine 1% - apply on feet 2x a day; but expensive and it did not work well for her - continue Voltaren gel, which works a little better  Office Visit on 05/21/2014  Component Date Value Ref Range Status  . Sodium 05/21/2014 138  135 - 145 mEq/L Final  . Potassium 05/21/2014 3.6  3.5 - 5.1 mEq/L Final  . Chloride 05/21/2014 104  96 - 112 mEq/L Final  . CO2 05/21/2014 19  19 - 32 mEq/L Final  . Glucose, Bld 05/21/2014 260* 70 - 99 mg/dL Final  . BUN 05/21/2014 21  6 - 23 mg/dL Final  . Creatinine, Ser 05/21/2014 1.3* 0.4 - 1.2 mg/dL Final  . Calcium 05/21/2014 9.5  8.4 - 10.5 mg/dL Final  . GFR 05/21/2014 41.58* >60.00 mL/min Final   Creatinine has decreased. I will advise the patient to stay better hydrated and I would like to check another BMP when she returns in a month. At that time, if the kidney function is still decreased, will probably need to stop Invokana and reduce metformin.

## 2014-06-05 ENCOUNTER — Other Ambulatory Visit: Payer: Self-pay | Admitting: Physician Assistant

## 2014-06-08 ENCOUNTER — Telehealth (INDEPENDENT_AMBULATORY_CARE_PROVIDER_SITE_OTHER): Payer: Self-pay

## 2014-06-08 DIAGNOSIS — R109 Unspecified abdominal pain: Secondary | ICD-10-CM

## 2014-06-08 NOTE — Telephone Encounter (Signed)
Pt seen in office today by Dr Dalbert Batman and orders placed in epic.

## 2014-06-08 NOTE — Telephone Encounter (Signed)
Dr L, you have seen pt in Oct, but don't see this med discussed recently. OK to RF?

## 2014-06-12 ENCOUNTER — Ambulatory Visit
Admission: RE | Admit: 2014-06-12 | Discharge: 2014-06-12 | Disposition: A | Discharge status: Home / Self Care | Payer: Medicare Other | Source: Ambulatory Visit | Attending: General Surgery | Admitting: General Surgery

## 2014-06-12 DIAGNOSIS — R109 Unspecified abdominal pain: Secondary | ICD-10-CM

## 2014-06-15 NOTE — Progress Notes (Signed)
Patient results gvn at appt today

## 2014-06-16 ENCOUNTER — Encounter (HOSPITAL_COMMUNITY): Payer: Medicare Other

## 2014-07-14 ENCOUNTER — Telehealth: Payer: Self-pay

## 2014-07-14 ENCOUNTER — Other Ambulatory Visit: Payer: Self-pay | Admitting: Family Medicine

## 2014-07-14 DIAGNOSIS — E1165 Type 2 diabetes mellitus with hyperglycemia: Secondary | ICD-10-CM

## 2014-07-14 NOTE — Telephone Encounter (Signed)
Patient is requesting an order from Dr. Joseph Art for her labs to be drawn by Mervyn Skeeters on 07/15/14 before her appointment with Dr. Carlean Jews the following week.   [phone]

## 2014-07-14 NOTE — Progress Notes (Signed)
Pt advised she may come in for labs prior to her OV.

## 2014-07-14 NOTE — Telephone Encounter (Signed)
Please let patient know she can come in anytime for lab draw.

## 2014-07-14 NOTE — Telephone Encounter (Signed)
Please advise what orders to have pending for pt lab draw.

## 2014-07-15 ENCOUNTER — Other Ambulatory Visit (INDEPENDENT_AMBULATORY_CARE_PROVIDER_SITE_OTHER): Payer: 59 | Admitting: Family Medicine

## 2014-07-15 DIAGNOSIS — E1142 Type 2 diabetes mellitus with diabetic polyneuropathy: Secondary | ICD-10-CM

## 2014-07-15 DIAGNOSIS — I10 Essential (primary) hypertension: Secondary | ICD-10-CM

## 2014-07-15 DIAGNOSIS — E119 Type 2 diabetes mellitus without complications: Secondary | ICD-10-CM

## 2014-07-16 ENCOUNTER — Ambulatory Visit: Payer: Medicare Other | Admitting: Family Medicine

## 2014-07-16 LAB — COMPLETE METABOLIC PANEL WITH GFR
ALT: 12 U/L (ref 0–35)
AST: 17 U/L (ref 0–37)
Albumin: 4.1 g/dL (ref 3.5–5.2)
Alkaline Phosphatase: 82 U/L (ref 39–117)
BUN: 20 mg/dL (ref 6–23)
CO2: 28 mEq/L (ref 19–32)
Calcium: 9.3 mg/dL (ref 8.4–10.5)
Chloride: 102 mEq/L (ref 96–112)
Creat: 0.88 mg/dL (ref 0.50–1.10)
GFR, Est African American: 75 mL/min
GFR, Est Non African American: 65 mL/min
Glucose, Bld: 126 mg/dL — ABNORMAL HIGH (ref 70–99)
Potassium: 4 mEq/L (ref 3.5–5.3)
Sodium: 139 mEq/L (ref 135–145)
Total Bilirubin: 0.5 mg/dL (ref 0.2–1.2)
Total Protein: 6.7 g/dL (ref 6.0–8.3)

## 2014-07-16 LAB — HEMOGLOBIN A1C
Hgb A1c MFr Bld: 6.8 % — ABNORMAL HIGH (ref <5.7)
Mean Plasma Glucose: 148 mg/dL — ABNORMAL HIGH (ref <117)

## 2014-07-17 ENCOUNTER — Other Ambulatory Visit: Payer: Self-pay | Admitting: Family Medicine

## 2014-07-23 ENCOUNTER — Encounter: Payer: Self-pay | Admitting: Family Medicine

## 2014-07-23 ENCOUNTER — Ambulatory Visit (INDEPENDENT_AMBULATORY_CARE_PROVIDER_SITE_OTHER): Payer: Medicare Other | Admitting: Family Medicine

## 2014-07-23 VITALS — BP 110/56 | HR 80 | Temp 98.0°F | Resp 16 | Ht 64.5 in | Wt 170.4 lb

## 2014-07-23 DIAGNOSIS — L298 Other pruritus: Secondary | ICD-10-CM

## 2014-07-23 DIAGNOSIS — J069 Acute upper respiratory infection, unspecified: Secondary | ICD-10-CM

## 2014-07-23 DIAGNOSIS — N3941 Urge incontinence: Secondary | ICD-10-CM

## 2014-07-23 DIAGNOSIS — N898 Other specified noninflammatory disorders of vagina: Secondary | ICD-10-CM

## 2014-07-23 DIAGNOSIS — E119 Type 2 diabetes mellitus without complications: Secondary | ICD-10-CM

## 2014-07-23 LAB — POCT URINALYSIS DIPSTICK
Bilirubin, UA: NEGATIVE
Glucose, UA: >1000
Ketones, UA: NEGATIVE
Nitrite, UA: NEGATIVE
Protein, UA: NEGATIVE
Spec Grav, UA: <=1.005
Urobilinogen, UA: 0.2
pH, UA: 5

## 2014-07-23 LAB — POCT UA - MICROSCOPIC ONLY
Bacteria, U Microscopic: NEGATIVE
Casts, Ur, LPF, POC: NEGATIVE
Crystals, Ur, HPF, POC: NEGATIVE
Mucus, UA: NEGATIVE
Yeast, UA: NEGATIVE

## 2014-07-23 LAB — POCT WET PREP WITH KOH
Clue Cells Wet Prep HPF POC: NEGATIVE
KOH Prep POC: NEGATIVE
Trichomonas, UA: NEGATIVE
Yeast Wet Prep HPF POC: NEGATIVE

## 2014-07-23 MED ORDER — FLUCONAZOLE 150 MG PO TABS: 150.0000 mg | ORAL_TABLET | Freq: Once | ORAL | Status: DC

## 2014-07-23 MED ORDER — AMOXICILLIN 875 MG PO TABS: 875.0000 mg | ORAL_TABLET | Freq: Two times a day (BID) | ORAL | Status: AC

## 2014-07-23 NOTE — Patient Instructions (Addendum)
Please take check your blood glucose when your feeling tremulous.  This will indicate that modifications will need to be made with your medication.  And if you continue with this weight loss, then you may need less of your medication.    You only need 1 pill for this presumed yeast infection.  If the symptoms do not improve within 48 hours, please contact the clinic.

## 2014-07-23 NOTE — Progress Notes (Signed)
MRN: [id number] DOB: 11-23-1940  Subjective:   [name] [name] is a 75 y.o. female presenting for follow up of diabetes.  She states that she is doing well, though she has increased fatigue Patient is followed by Dr. Harle Stanford last night. Contributes the weight loss due to metformin weight loss.  She was taking metformin 2 tablets, but she has decreased to 1.  She states that she wishes to discontinue the invokana.  She believes that it is the cause of vaginal itching.  It is extremely itchy.  She denies any vaginal odor, abdominal pain, or odd discharge.    She also complains of post nasal drip and fatigue that has been present for about 3 weeks.  She states she was in [place] with her Lenon Ahmadi, when she was having nasal congestion, cough, and runny nose.  The symptoms have resolved minus a post-nasal drip sensation, but she also feels extremely tired.  She denies fever.  She took mucinex which was somewhat helpful.  HTN: She denies chest pains, palpitations, vision changes, or leg swellings.  She is compliant with medication.   [name] [name] has a current medication list which includes the following prescription(s): azelastine, buspirone, canagliflozin, diazepam, diclofenac sodium, fluticasone, freestyle lite, furosemide, glipizide, freestyle, metformin, montelukast, nystatin, toprol xl, xifaxan, conjugated estrogens, lactobacillus acidophilus, lidoderm, NONFORMULARY OR COMPOUNDED ITEM, potassium chloride sa, and zaleplon.  She is allergic to sulfa drugs cross reactors.  [name] [name]  has a past medical history of Hypertension; Arthritis; IBS (irritable bowel syndrome); Allergy; Clotting disorder; Diabetes mellitus; and Cancer. Also  has past surgical history that includes Mastectomy (2002); Breast lumpectomy (1996); and Abdominal hysterectomy (1985).  ROS As in subjective.  Objective:   Vitals: BP 110/56 mmHg  Pulse 80  Temp(Src) 98 F (36.7 C) (Oral)  Resp 16  Ht 5' 4.5" (1.638 m)  Wt  170 lb 6.4 oz (77.293 kg)  BMI 28.81 kg/m2  SpO2 96%  Physical Exam  Constitutional: She is oriented to person, place, and time and well-developed, well-nourished, and in no distress. No distress.  HENT:  Head: Normocephalic and atraumatic.  Nose: Rhinorrhea present. No mucosal edema.  Mouth/Throat: No uvula swelling. No oropharyngeal exudate, posterior oropharyngeal edema or posterior oropharyngeal erythema.  Eyes: Conjunctivae are normal. Pupils are equal, round, and reactive to light.  Neck: Normal range of motion. Neck supple.  Cardiovascular: Normal rate, regular rhythm and normal heart sounds.  Exam reveals no gallop and no friction rub.   No murmur heard. Pulmonary/Chest: Effort normal and breath sounds normal. No respiratory distress. She has no wheezes.  Lymphadenopathy:    She has no cervical adenopathy.  Neurological: She is alert and oriented to person, place, and time. Gait (wide base gait) abnormal.  Skin: Skin is warm and dry. She is not diaphoretic.  Psychiatric: Mood and affect normal.  Nursing note and vitals reviewed.   Wt Readings from Last 3 Encounters:  07/23/14 170 lb 6.4 oz (77.293 kg)  05/21/14 179 lb 12.8 oz (81.557 kg)  05/20/14 174 lb (78.926 kg)   Results for orders placed or performed in visit on 07/23/14  POCT Wet Prep with KOH  Result Value Ref Range   Trichomonas, UA Negative    Clue Cells Wet Prep HPF POC neg    Epithelial Wet Prep HPF POC 0-4    Yeast Wet Prep HPF POC neg    Bacteria Wet Prep HPF POC 2+    RBC Wet Prep HPF POC 0-1    WBC Wet  Prep HPF POC 0-3    KOH Prep POC Negative   POCT UA - Microscopic Only  Result Value Ref Range   WBC, Ur, HPF, POC 0-3    RBC, urine, microscopic 0-2    Bacteria, U Microscopic NEG    Mucus, UA NEG    Epithelial cells, urine per micros 1-4    Crystals, Ur, HPF, POC NEG    Casts, Ur, LPF, POC NEG    Yeast, UA NEG   POCT urinalysis dipstick  Result Value Ref Range   Color, UA YELLOW    Clarity,  UA CLEAR    Glucose, UA >1000    Bilirubin, UA NEG    Ketones, UA NEG    Spec Grav, UA <=1.005    Blood, UA TRACE    pH, UA 5.0    Protein, UA NEG    Urobilinogen, UA 0.2    Nitrite, UA NEG    Leukocytes, UA Trace      Assessment and Plan :   74 year old female is here today for follow up of DM2.  Diabetes mellitus without complication - Plan: [facility] Diabetes Foot Exam -She will discontinue the invokana and discuss with Dr. Cruzita Lederer at her next visit, 07/28/2014.  Vaginal itching - Plan: POCT Wet Prep with KOH, fluconazole (DIFLUCAN) 150 MG tablet -1 tablet and contact if symptoms do not resolve.  If the symptoms do not resolve, will continue adding premarin cream with atrophic vaginitis.    Upper respiratory infection - Plan: amoxicillin (AMOXIL) 875 MG tablet  Urgency incontinence - Plan: POCT UA - Microscopic Only, POCT urinalysis dipstick

## 2014-07-25 ENCOUNTER — Telehealth: Payer: Self-pay

## 2014-07-25 NOTE — Telephone Encounter (Signed)
Dr. Carlean Jews   Patient says her UTI is better, sinsues are not better.\ If you want her to try premarin - she will need a script.   [place]

## 2014-07-26 ENCOUNTER — Other Ambulatory Visit: Payer: Self-pay | Admitting: Family Medicine

## 2014-07-26 DIAGNOSIS — N952 Postmenopausal atrophic vaginitis: Secondary | ICD-10-CM

## 2014-07-26 MED ORDER — ESTROGENS, CONJUGATED 0.625 MG/GM VA CREA: 1.0000 | TOPICAL_CREAM | Freq: Every day | VAGINAL | Status: DC

## 2014-07-28 ENCOUNTER — Encounter: Payer: Self-pay | Admitting: Internal Medicine

## 2014-07-28 ENCOUNTER — Ambulatory Visit (INDEPENDENT_AMBULATORY_CARE_PROVIDER_SITE_OTHER): Payer: Medicare Other | Admitting: Internal Medicine

## 2014-07-28 VITALS — BP 114/72 | HR 90 | Temp 97.4°F | Resp 12 | Wt 173.8 lb

## 2014-07-28 DIAGNOSIS — B3731 Acute candidiasis of vulva and vagina: Secondary | ICD-10-CM

## 2014-07-28 DIAGNOSIS — E1165 Type 2 diabetes mellitus with hyperglycemia: Secondary | ICD-10-CM

## 2014-07-28 DIAGNOSIS — IMO0002 Reserved for concepts with insufficient information to code with codable children: Secondary | ICD-10-CM

## 2014-07-28 DIAGNOSIS — E1129 Type 2 diabetes mellitus with other diabetic kidney complication: Secondary | ICD-10-CM

## 2014-07-28 DIAGNOSIS — E1121 Type 2 diabetes mellitus with diabetic nephropathy: Secondary | ICD-10-CM

## 2014-07-28 DIAGNOSIS — B373 Candidiasis of vulva and vagina: Secondary | ICD-10-CM

## 2014-07-28 NOTE — Patient Instructions (Signed)
Please take another diflucan tablet. Use Poise panty liners. Continue: - Invokana 100 mg in am - metformin 500 mg 3x a day with meals - Glipizide 5 mg 2x a day, before breakfast and dinner  Please return in 3 months with your sugar log.   Let me know if the yeast infection continue.

## 2014-07-28 NOTE — Progress Notes (Signed)
Patient ID: [name] [name], female   DOB: January 02, 1941, 74 y.o.   MRN: [id number]  HPI: [name] [name] is a 74 y.o.-year-old female, returning for follow-up for DM2, dx 2014, non-insulin-dependent, uncontrolled, with complications (mild CKD). Last visit 2 month ago.  She has URI. Also, a severe yeast infection. Took Diflucan x 2 tabs. She stopped Invokana >> itching better >> restarted Invokana >> itching worse.  Last hemoglobin A1c was: Lab Results  Component Value Date   HGBA1C 6.8* 07/15/2014   HGBA1C 9.8* 04/02/2014   HGBA1C 9.7* 12/25/2013   Pt is on a regimen of: - Invokana 100 mg in am - started 04/2014 - metformin 500 mg tid - started 04/2014  - Glipizide 5 mg 2x a day, before breakfast and dinner She stopped Glipizide XL 5 mg in am when started Invokana.  We gave pt a glucometer at last visit and she started to check her sugars 1xa day: - A.m.: 542-706 (later higher after reducing metformin) >> 107-154 - 2 hours post breakfast: 264 >> n/c - Before lunch: n/c >> 95-144 - 2 hours post lunch: n/c >> 85-111 - before dinner: n/c >> 82-125 - 2 hours post dinner: n/c  - Bedtime: n/c >> 84-121  Pt's meals are: - Breakfast: apple + cottage cheese + whole wheat - Lunch: leftovers from dinner; sandwich; salad - Dinner: varies; some fast food - Snacks: veggies; chips Some diet sodas.  [facility], Tai Chi 1x a week.  - + CKD, last BUN/creatinine:  Lab Results  Component Value Date   BUN 20 07/15/2014   CREATININE 0.88 07/15/2014  Not on an ACEI. - last set of lipids: Lab Results  Component Value Date   CHOL 183 01/01/2014   HDL 37* 01/01/2014   LDLCALC NOT CALC 01/01/2014   TRIG 401* 01/01/2014   CHOLHDL 4.9 01/01/2014  Not on a statin. - last eye exam was in 02/2014. No DR.  - + numbness and tingling in her feet. She tells me she had PN long before she was dx with DM. She used Lidocaine patches >> discontinued. We started  PN cream at last visit, but this was expensive (93$) and did not work much >> Voltaren gel works a little better.  I reviewed pt's medications, allergies, PMH, social hx, family hx, and changes were documented in the history of present illness. Otherwise, unchanged from my initial visit note.  ROS: Constitutional: no weight gain/loss, no fatigue, no subjective hyperthermia/hypothermia Eyes: no blurry vision, no xerophthalmia ENT: no sore throat, no nodules palpated in throat, no dysphagia/odynophagia, no hoarseness Cardiovascular: no CP/SOB/palpitations/leg swelling Respiratory: no cough/SOB Gastrointestinal: no N/ V/D/C/+ heartburn Musculoskeletal: no muscle/joint aches Skin: no rashes, no hair loss Neurological: no tremors/numbness/+ itching - vaginal  PE: BP 114/72 mmHg  Pulse 90  Temp(Src) 97.4 F (36.3 C) (Oral)  Resp 12  Wt 173 lb 12.8 oz (78.835 kg)  SpO2 94% Wt Readings from Last 3 Encounters:  07/28/14 173 lb 12.8 oz (78.835 kg)  07/23/14 170 lb 6.4 oz (77.293 kg)  05/21/14 179 lb 12.8 oz (81.557 kg)   Constitutional: overweight, in NAD Eyes: PERRLA, EOMI, no exophthalmos ENT: moist mucous membranes, no thyromegaly, no cervical lymphadenopathy Cardiovascular: RRR, No MRG Respiratory: CTA B Gastrointestinal: abdomen soft, NT, ND, BS+ Musculoskeletal: no deformities, strength intact in all 4 Skin: moist, warm, no rashes Neurological: + tremor with outstretched hands, DTR normal in all 4  ASSESSMENT: 1.  DM2, non-insulin-dependent, uncontrolled, with complications - mild CKD  2. Yeast vaginitis  PLAN:  1. Patient with uncontrolled diabetes, on oral antidiabetic regimen with Invokana, Metformin, and Glipizide  >> sugars greatly improved! - per log and per last HbA1c. Patient Instructions  Please take another diflucan tablet. Use Poise panty liners. Continue: - Invokana 100 mg in am - metformin 500 mg 3x a day with meals - Glipizide 5 mg 2x a day, before  breakfast and dinner  Please return in 3 months with your sugar log.   Let me know if the yeast infection continue.  - continue checking sugars in alternative sites - continue checking sugars at different times of the day - check 1-2 times a day, rotating checks - advised for yearly eye exams - she is up to date - she had the flu vaccine this season - Return to clinic in 3 mo with sugar log   2. Yeast vaginitis - likely 2/2 Invokana, but previous AB use could have contributed - we discussed to continue for now and tx again with Diflican po and Monistat intravaginally and use Poise panty liners - if it persist >> need to stop Invokana and maybe start Januvia/Tradjenta

## 2014-08-13 ENCOUNTER — Other Ambulatory Visit: Payer: Self-pay | Admitting: Internal Medicine

## 2014-09-10 ENCOUNTER — Other Ambulatory Visit: Payer: Self-pay | Admitting: Family Medicine

## 2014-09-10 ENCOUNTER — Telehealth: Payer: Self-pay

## 2014-09-10 DIAGNOSIS — R2681 Unsteadiness on feet: Secondary | ICD-10-CM

## 2014-09-10 NOTE — Telephone Encounter (Signed)
Patient requesting a referral for physical therapy. Patients call back number is [phone]

## 2014-09-14 ENCOUNTER — Telehealth: Payer: Self-pay | Admitting: Family Medicine

## 2014-09-14 ENCOUNTER — Other Ambulatory Visit: Payer: Self-pay | Admitting: Internal Medicine

## 2014-09-14 NOTE — Telephone Encounter (Signed)
Spoke with patient she will return to get her BMP drawn it an order only

## 2014-09-14 NOTE — Telephone Encounter (Signed)
-----   Message from Robyn Haber, MD sent at 09/13/2014  8:01 AM EST ----- I would like Ms. Roets to return for repeat lab work this month. ----- Message -----    From: SYSTEM    Sent: 09/13/2014  12:03 AM      To: Robyn Haber, MD

## 2014-09-14 NOTE — Telephone Encounter (Signed)
LMOM to call about labs

## 2014-09-15 NOTE — Telephone Encounter (Signed)
lmom TO CB

## 2014-09-15 NOTE — Telephone Encounter (Signed)
-----   Message from Robyn Haber, MD sent at 09/13/2014  8:01 AM EST ----- I would like Ms. Pilz to return for repeat lab work this month. ----- Message -----    From: SYSTEM    Sent: 09/13/2014  12:03 AM      To: Robyn Haber, MD

## 2014-09-16 NOTE — Telephone Encounter (Signed)
Pt called back requesting Angie, to see if she found out anything from Dr. Joseph Art.  Please call

## 2014-09-17 ENCOUNTER — Telehealth: Payer: Self-pay | Admitting: Family Medicine

## 2014-09-17 NOTE — Telephone Encounter (Signed)
Spoke with Dr Joseph Art he stated that he wants Ms Heidler to just wait until April appt. To get blood drawn patient agrees

## 2014-09-24 ENCOUNTER — Other Ambulatory Visit: Payer: Self-pay | Admitting: Family Medicine

## 2014-09-27 ENCOUNTER — Other Ambulatory Visit: Payer: Self-pay | Admitting: Family Medicine

## 2014-09-28 NOTE — Telephone Encounter (Signed)
Faxed

## 2014-09-29 ENCOUNTER — Other Ambulatory Visit: Payer: Self-pay | Admitting: *Deleted

## 2014-09-29 MED ORDER — ONETOUCH DELICA LANCETS 33G MISC: Status: DC

## 2014-09-29 MED ORDER — GLUCOSE BLOOD VI STRP: ORAL_STRIP | Status: DC

## 2014-10-13 ENCOUNTER — Encounter: Payer: Self-pay | Admitting: *Deleted

## 2014-10-14 ENCOUNTER — Telehealth: Payer: Self-pay | Admitting: *Deleted

## 2014-10-14 NOTE — Telephone Encounter (Addendum)
Patient phoned & LM in response to mailed letter ----- stated she had had a double mastectomy & mammograms were not necessary and Dr. Zenia Resides office was her eye doctor and stated she already had an appt scheduled.  Attempted to phone patient, got answering machine, ID'ed myself & title, and offered my apologies for the breast cancer screening info.    Health maintenance updated for mammograms "patient not a candidate for mammograms" to prevent future scenarios as above. Dr. Katy Fitch is listed under care team.

## 2014-10-15 ENCOUNTER — Other Ambulatory Visit (INDEPENDENT_AMBULATORY_CARE_PROVIDER_SITE_OTHER): Payer: Medicare Other | Admitting: *Deleted

## 2014-10-15 ENCOUNTER — Other Ambulatory Visit: Payer: Self-pay | Admitting: Family Medicine

## 2014-10-15 DIAGNOSIS — E1165 Type 2 diabetes mellitus with hyperglycemia: Secondary | ICD-10-CM

## 2014-10-15 LAB — BASIC METABOLIC PANEL
BUN: 23 mg/dL (ref 6–23)
CO2: 26 mEq/L (ref 19–32)
Calcium: 9.2 mg/dL (ref 8.4–10.5)
Chloride: 103 mEq/L (ref 96–112)
Creat: 1.1 mg/dL (ref 0.50–1.10)
Glucose, Bld: 115 mg/dL — ABNORMAL HIGH (ref 70–99)
Potassium: 4.1 mEq/L (ref 3.5–5.3)
Sodium: 142 mEq/L (ref 135–145)

## 2014-10-15 LAB — HEMOGLOBIN A1C
Hgb A1c MFr Bld: 6.1 % — ABNORMAL HIGH (ref <5.7)
Mean Plasma Glucose: 128 mg/dL — ABNORMAL HIGH (ref <117)

## 2014-10-16 ENCOUNTER — Other Ambulatory Visit: Payer: Self-pay | Admitting: *Deleted

## 2014-10-16 MED ORDER — GLUCOSE BLOOD VI STRP: ORAL_STRIP | Status: DC

## 2014-10-16 NOTE — Telephone Encounter (Signed)
Resending rx for test strips. Did not go through to pharmacy.

## 2014-10-22 ENCOUNTER — Encounter: Payer: Self-pay | Admitting: Family Medicine

## 2014-10-22 ENCOUNTER — Ambulatory Visit (INDEPENDENT_AMBULATORY_CARE_PROVIDER_SITE_OTHER): Payer: Medicare Other | Admitting: Family Medicine

## 2014-10-22 VITALS — BP 140/72 | HR 83 | Temp 98.1°F | Resp 16 | Ht 64.5 in | Wt 172.0 lb

## 2014-10-22 DIAGNOSIS — E1142 Type 2 diabetes mellitus with diabetic polyneuropathy: Secondary | ICD-10-CM

## 2014-10-22 DIAGNOSIS — L918 Other hypertrophic disorders of the skin: Secondary | ICD-10-CM | POA: Diagnosis not present

## 2014-10-22 DIAGNOSIS — R269 Unspecified abnormalities of gait and mobility: Secondary | ICD-10-CM | POA: Diagnosis not present

## 2014-10-22 MED ORDER — CANAGLIFLOZIN 100 MG PO TABS: 100.0000 mg | ORAL_TABLET | Freq: Every day | ORAL | Status: DC

## 2014-10-22 NOTE — Patient Instructions (Signed)
Results for orders placed or performed in visit on 77/41/42  Basic metabolic panel  Result Value Ref Range   Sodium 142 135 - 145 mEq/L   Potassium 4.1 3.5 - 5.3 mEq/L   Chloride 103 96 - 112 mEq/L   CO2 26 19 - 32 mEq/L   Glucose, Bld 115 (H) 70 - 99 mg/dL   BUN 23 6 - 23 mg/dL   Creat 1.10 0.50 - 1.10 mg/dL   Calcium 9.2 8.4 - 10.5 mg/dL  Hemoglobin A1c  Result Value Ref Range   Hgb A1c MFr Bld 6.1 (H) <5.7 %   Mean Plasma Glucose 128 (H) <117 mg/dL

## 2014-10-22 NOTE — Progress Notes (Signed)
Going out of town.  Prefers to delay prevnar until back in town Balance issues continue Has not continued with Barbaraann Barthel after difficult session, but plans on going back for PT after trip to [place].  Prefers to go to PT at [facility]  Objective:  NAD BS's 79-142 at home. 128/60 Small skin tag on right posterior neck that is fleshy and 2-3 mm.  Not irritated Chest clear Heart reg, no murmur Wide based gait Results for orders placed or performed in visit on 62/26/33  Basic metabolic panel  Result Value Ref Range   Sodium 142 135 - 145 mEq/L   Potassium 4.1 3.5 - 5.3 mEq/L   Chloride 103 96 - 112 mEq/L   CO2 26 19 - 32 mEq/L   Glucose, Bld 115 (H) 70 - 99 mg/dL   BUN 23 6 - 23 mg/dL   Creat 1.10 0.50 - 1.10 mg/dL   Calcium 9.2 8.4 - 10.5 mg/dL  Hemoglobin A1c  Result Value Ref Range   Hgb A1c MFr Bld 6.1 (H) <5.7 %   Mean Plasma Glucose 128 (H) <117 mg/dL        ICD-9-CM ICD-10-CM   1. Type 2 diabetes mellitus with diabetic polyneuropathy 250.60 E11.42 canagliflozin (INVOKANA) 100 MG TABS tablet   357.2    2. Gait disturbance 781.2 R26.9 Ambulatory referral to Physical Therapy  3. Skin tag 701.9 L91.8      Signed, Robyn Haber, MD

## 2014-10-29 ENCOUNTER — Ambulatory Visit: Payer: Medicare Other | Admitting: Internal Medicine

## 2014-11-03 ENCOUNTER — Encounter (HOSPITAL_COMMUNITY): Payer: Self-pay | Admitting: Physical Medicine and Rehabilitation

## 2014-11-03 ENCOUNTER — Emergency Department (HOSPITAL_COMMUNITY)
Admission: EM | Admit: 2014-11-03 | Discharge: 2014-11-03 | Disposition: A | Discharge status: Home / Self Care | Payer: Medicare Other | Attending: Emergency Medicine | Admitting: Emergency Medicine

## 2014-11-03 ENCOUNTER — Ambulatory Visit: Payer: Medicare Other

## 2014-11-03 ENCOUNTER — Telehealth: Payer: Self-pay | Admitting: Emergency Medicine

## 2014-11-03 ENCOUNTER — Emergency Department (HOSPITAL_COMMUNITY): Payer: Medicare Other

## 2014-11-03 DIAGNOSIS — H532 Diplopia: Secondary | ICD-10-CM | POA: Diagnosis not present

## 2014-11-03 DIAGNOSIS — I10 Essential (primary) hypertension: Secondary | ICD-10-CM | POA: Insufficient documentation

## 2014-11-03 DIAGNOSIS — Z7951 Long term (current) use of inhaled steroids: Secondary | ICD-10-CM | POA: Diagnosis not present

## 2014-11-03 DIAGNOSIS — Z8719 Personal history of other diseases of the digestive system: Secondary | ICD-10-CM | POA: Diagnosis not present

## 2014-11-03 DIAGNOSIS — E119 Type 2 diabetes mellitus without complications: Secondary | ICD-10-CM | POA: Diagnosis not present

## 2014-11-03 DIAGNOSIS — Z7982 Long term (current) use of aspirin: Secondary | ICD-10-CM | POA: Diagnosis not present

## 2014-11-03 DIAGNOSIS — Z862 Personal history of diseases of the blood and blood-forming organs and certain disorders involving the immune mechanism: Secondary | ICD-10-CM | POA: Insufficient documentation

## 2014-11-03 DIAGNOSIS — Z79899 Other long term (current) drug therapy: Secondary | ICD-10-CM | POA: Diagnosis not present

## 2014-11-03 DIAGNOSIS — M1990 Unspecified osteoarthritis, unspecified site: Secondary | ICD-10-CM | POA: Diagnosis not present

## 2014-11-03 DIAGNOSIS — H4912 Fourth [trochlear] nerve palsy, left eye: Secondary | ICD-10-CM

## 2014-11-03 DIAGNOSIS — Z853 Personal history of malignant neoplasm of breast: Secondary | ICD-10-CM | POA: Insufficient documentation

## 2014-11-03 LAB — DIFFERENTIAL
Basophils Absolute: 0.1 10*3/uL (ref 0.0–0.1)
Basophils Relative: 1 % (ref 0–1)
Eosinophils Absolute: 0.1 10*3/uL (ref 0.0–0.7)
Eosinophils Relative: 1 % (ref 0–5)
Lymphocytes Relative: 23 % (ref 12–46)
Lymphs Abs: 2.1 10*3/uL (ref 0.7–4.0)
Monocytes Absolute: 0.5 10*3/uL (ref 0.1–1.0)
Monocytes Relative: 5 % (ref 3–12)
Neutro Abs: 6.2 10*3/uL (ref 1.7–7.7)
Neutrophils Relative %: 70 % (ref 43–77)

## 2014-11-03 LAB — COMPREHENSIVE METABOLIC PANEL
ALT: 16 U/L (ref 0–35)
AST: 22 U/L (ref 0–37)
Albumin: 4.1 g/dL (ref 3.5–5.2)
Alkaline Phosphatase: 75 U/L (ref 39–117)
Anion gap: 14 (ref 5–15)
BUN: 33 mg/dL — ABNORMAL HIGH (ref 6–23)
CO2: 23 mmol/L (ref 19–32)
Calcium: 9.3 mg/dL (ref 8.4–10.5)
Chloride: 102 mmol/L (ref 96–112)
Creatinine, Ser: 1.31 mg/dL — ABNORMAL HIGH (ref 0.50–1.10)
GFR calc Af Amer: 46 mL/min — ABNORMAL LOW (ref >90)
GFR calc non Af Amer: 39 mL/min — ABNORMAL LOW (ref >90)
Glucose, Bld: 216 mg/dL — ABNORMAL HIGH (ref 70–99)
Potassium: 3.5 mmol/L (ref 3.5–5.1)
Sodium: 139 mmol/L (ref 135–145)
Total Bilirubin: 0.5 mg/dL (ref 0.3–1.2)
Total Protein: 6.8 g/dL (ref 6.0–8.3)

## 2014-11-03 LAB — APTT: aPTT: 33 seconds (ref 24–37)

## 2014-11-03 LAB — I-STAT TROPONIN, ED: Troponin i, poc: 0 ng/mL (ref 0.00–0.08)

## 2014-11-03 LAB — CBC
HCT: 45.5 % (ref 36.0–46.0)
Hemoglobin: 15.4 g/dL — ABNORMAL HIGH (ref 12.0–15.0)
MCH: 28.5 pg (ref 26.0–34.0)
MCHC: 33.8 g/dL (ref 30.0–36.0)
MCV: 84.3 fL (ref 78.0–100.0)
Platelets: 337 10*3/uL (ref 150–400)
RBC: 5.4 MIL/uL — ABNORMAL HIGH (ref 3.87–5.11)
RDW: 13.4 % (ref 11.5–15.5)
WBC: 9 10*3/uL (ref 4.0–10.5)

## 2014-11-03 LAB — HM DIABETES EYE EXAM

## 2014-11-03 LAB — PROTIME-INR
INR: 0.99 (ref 0.00–1.49)
Prothrombin Time: 13.2 seconds (ref 11.6–15.2)

## 2014-11-03 NOTE — ED Notes (Signed)
Pt presents to department for evaluation of double vision. Onset Monday morning after receiving allergy shot. Reports intermittent double vision throughout day yesterday, vision became worse today, was seen at opthalmologist  this morning, told to come to ED for possible MRI. No facial droop noted, speech clear, strong equal bilateral grip strengths. Pt is alert and oriented x4.

## 2014-11-03 NOTE — Telephone Encounter (Signed)
Patient sent here from Dr. Zenia Resides office to see if we can order an MRI to rule out a stroke. On Saturday patient started having double vision along with worsening problems with gait. I felt this was an emergent problem. I called triage at [facility]. Her husband is comfortable with driving her to the hospital for their evaluation to rule out posterior circulation stroke.

## 2014-11-03 NOTE — ED Notes (Signed)
Patient transported to MRI 

## 2014-11-03 NOTE — Telephone Encounter (Signed)
Please have patient come in for evaluation.  A careful neuro exam needs to be done today!

## 2014-11-03 NOTE — ED Notes (Signed)
Pt in MRI.

## 2014-11-03 NOTE — ED Notes (Signed)
Dr. Osvaldo Angst called and reported that pt. To be seen by EDP,.  She developed unsteady gait and double vision since Saturday.  Dr. Osvaldo Angst reported that pt. Has a hx of unsteady gait, but it has become worse since Saturday.

## 2014-11-03 NOTE — Telephone Encounter (Signed)
Pt is in ED being seen right now.

## 2014-11-03 NOTE — ED Provider Notes (Signed)
CSN: [id number]     Arrival date & time 11/03/14  1305 History   First MD Initiated Contact with Patient 11/03/14 1333     Chief Complaint  Patient presents with  . Diplopia     (Consider location/radiation/quality/duration/timing/severity/associated sxs/prior Treatment) Patient is a 74 y.o. female presenting with eye problem. The history is provided by the patient. No language interpreter was used.  Eye Problem Location:  Both Quality: diplopia. Severity:  Mild Onset quality:  Gradual Timing:  Constant Progression:  Unchanged Chronicity:  New Context: not direct trauma   Relieved by:  Closing eye Worsened by:  Nothing tried Ineffective treatments:  None tried Associated symptoms: double vision   Associated symptoms: no headaches, no nausea, no numbness, no photophobia, no vomiting and no weakness     Past Medical History  Diagnosis Date  . Hypertension   . Arthritis   . IBS (irritable bowel syndrome)   . Allergy   . Clotting disorder   . Diabetes mellitus   . Cancer     breast and skin   Past Surgical History  Procedure Laterality Date  . Mastectomy  2002    Bilateral  . Breast lumpectomy  1996  . Abdominal hysterectomy  1985   Family History  Problem Relation Age of Onset  . Heart disease Brother   . Heart disease Mother   . Heart disease Father    History  Substance Use Topics  . Smoking status: Never Smoker   . Smokeless tobacco: Never Used  . Alcohol Use: 0.0 oz/week     Comment: social   OB History    No data available     Review of Systems  Constitutional: Negative for diaphoresis and fatigue.  Eyes: Positive for double vision and visual disturbance. Negative for photophobia and pain.  Respiratory: Negative for cough, chest tightness and shortness of breath.   Cardiovascular: Negative for chest pain.  Gastrointestinal: Negative for nausea, vomiting and abdominal pain.  Musculoskeletal: Positive for gait problem (2/2 chronic ataxia).   Neurological: Negative for dizziness, tremors, seizures, speech difficulty, weakness, light-headedness, numbness and headaches.  Psychiatric/Behavioral: Negative for confusion.  All other systems reviewed and are negative.     Allergies  Sulfa drugs cross reactors  Home Medications   Prior to Admission medications   Medication Sig Start Date End Date Taking? Authorizing Provider  azelastine (ASTELIN) 0.1 % nasal spray Place 1 spray into both nostrils 2 (two) times daily.  02/10/14   Historical Provider, MD  busPIRone (BUSPAR) 30 MG tablet TAKE 1 TABLET TWICE DAILY. 07/20/14   Chelle S Jeffery, PA-C  canagliflozin (INVOKANA) 100 MG TABS tablet Take 1 tablet (100 mg total) by mouth daily. 10/22/14   Robyn Haber, MD  conjugated estrogens (PREMARIN) vaginal cream Place 1 Applicatorful vaginally daily. 07/26/14   Robyn Haber, MD  diazepam (VALIUM) 5 MG tablet TAKE 1/2 TABLET AT BEDTIME AS NEEDED. 09/25/14   Robyn Haber, MD  fluticasone [facility]) 50 MCG/ACT nasal spray Place 2 sprays into the nose daily. 03/27/13   Robyn Haber, MD  FREESTYLE LITE test strip CHECK BLOOD SUGAR TWICE DAILY. 05/11/14   [name], MD  furosemide (LASIX) 20 MG tablet TAKE 1 OR 2 TABLETS DAILY AS DIRECTED 10/16/14   Chelle S Jeffery, PA-C  glipiZIDE (GLUCOTROL) 5 MG tablet TAKE (1) TABLET TWICE A DAY BEFORE MEALS. 08/13/14   [name], MD  glucose blood (ONETOUCH VERIO) test strip Use to test blood sugar 2 times daily as instructed. Dx: E11.29  10/16/14   [name], MD  Lancets (FREESTYLE) lancets Use to test blood sugar 2 times daily as instructed. Dx code: E11.29 04/23/14   [name], MD  metFORMIN (GLUCOPHAGE) 500 MG tablet TAKE 2 TABLETS TWICE DAILY WITH MEALS. 09/15/14   [name], MD  montelukast (SINGULAIR) 10 MG tablet TAKE ONE TABLET AT BEDTIME. 09/25/14   Robyn Haber, MD  nystatin (MYCOSTATIN/NYSTOP) 100000 UNIT/GM POWD Apply 1 g topically 2 (two) times daily. 08/07/13    Robyn Haber, MD  [facility] DELICA LANCETS 16W MISC Use to test blood sugar 2 times daily as instructed. Dx: E11.29 09/29/14   [name], MD  TOPROL XL 100 MG 24 hr tablet TAKE 1 TABLET TWICE DAILY WITH FOOD. 07/20/14   Chelle S Jeffery, PA-C  XIFAXAN 550 MG TABS tablet TAKE 1 TABLET TWICE DAILY. 06/08/14   Robyn Haber, MD   BP 130/84 mmHg  Pulse 90  Temp(Src) 97.9 F (36.6 C) (Oral)  Resp 20  Ht 5\' 4"  (1.626 m)  Wt 170 lb (77.111 kg)  BMI 29.17 kg/m2  SpO2 100% Physical Exam  Constitutional: She is oriented to person, place, and time. She appears well-developed and well-nourished. No distress.  HENT:  Head: Normocephalic and atraumatic.  Nose: Nose normal.  Mouth/Throat: Oropharynx is clear and moist. No oropharyngeal exudate.  Eyes: EOM are normal. Pupils are equal, round, and reactive to light.  Both pupils dilated and nonreactive 2/2 meds.  Left eye is very slightly deviated down and in compared to her right eye.  No nystagmus.  Neck: Normal range of motion. Neck supple.  Cardiovascular: Normal rate, regular rhythm, normal heart sounds and intact distal pulses.   No murmur heard. Pulmonary/Chest: Effort normal and breath sounds normal. No respiratory distress. She has no wheezes. She exhibits no tenderness.  Abdominal: Soft. There is no tenderness. There is no rebound and no guarding.  Musculoskeletal: Normal range of motion. She exhibits no tenderness.  Lymphadenopathy:    She has no cervical adenopathy.  Neurological: She is alert and oriented to person, place, and time. She has normal strength. No cranial nerve deficit or sensory deficit. Gait abnormal.  + normal coordination when sitting/lying in bed (normal rapid alternating hand movements and heel to shin testing) but is very ataxic when standing.  + Romberg exam.  Requires assistance to ambulate due to unsteadiness.  Ambulates with wide based short steps   Skin: Skin is warm and dry. She is not diaphoretic.   Psychiatric: She has a normal mood and affect. Her behavior is normal. Judgment and thought content normal.  Nursing note and vitals reviewed.   ED Course  Procedures (including critical care time) Labs Review Labs Reviewed  CBC - Abnormal; Notable for the following:    RBC 5.40 (*)    Hemoglobin 15.4 (*)    All other components within normal limits  COMPREHENSIVE METABOLIC PANEL - Abnormal; Notable for the following:    Glucose, Bld 216 (*)    BUN 33 (*)    Creatinine, Ser 1.31 (*)    GFR calc non Af Amer 39 (*)    GFR calc Af Amer 46 (*)    All other components within normal limits  PROTIME-INR  APTT  DIFFERENTIAL  Randolm Idol, ED    Imaging Review Mr Brain Wo Contrast  11/03/2014   CLINICAL DATA:  Diplopia.  EXAM: MRI HEAD WITHOUT CONTRAST  TECHNIQUE: Multiplanar, multiecho pulse sequences of the brain and surrounding structures were obtained without intravenous contrast.  COMPARISON:  None.  FINDINGS: There is no evidence of acute infarct, intracranial hemorrhage, mass, midline shift, or extra-axial fluid collection. There is moderate cerebral atrophy. Small foci of T2 hyperintensity in the subcortical and deep cerebral white matter bilaterally are nonspecific but compatible with mild chronic small vessel ischemic disease. Dilated perivascular spaces are noted inferiorly in the basal ganglia bilaterally.  Prior right cataract extraction is noted. Paranasal sinuses and mastoid air cells are clear. Major intracranial vascular flow voids are preserved.  IMPRESSION: 1. No acute intracranial abnormality. 2. Moderate cerebral atrophy and mild chronic small vessel ischemic disease.   Electronically Signed   By: Logan Bores   On: 11/03/2014 18:03     EKG Interpretation   Date/Time:  Tuesday November 03 2014 13:10:14 EDT Ventricular Rate:  88 PR Interval:  182 QRS Duration: 80 QT Interval:  366 QTC Calculation: 442 R Axis:   -11 Text Interpretation:  Normal sinus rhythm Low  voltage QRS Abnormal ECG  Confirmed by Hazle Coca [phone]) on 11/03/2014 1:47:31 PM      MDM   Final diagnoses:  Vertical diplopia  Cranial nerve IV palsy, left   Pt is a 74 yo F with hx of breast cancer, HTN, DM, and chronic ataxia who presents with bilateral vertical diplopia.  Reports chronic ataxia that is most significant when ambulating but also has occasional truncal ataxia.  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  Reportedly her ataxic gait has been more severe for the past 4 days or so.  She denies falling to any particular side, no significant falls from it, but has needed to rely on her husband's arm to steady her gait for the past few days.  She then developed bilateral vertical diplopia yesterday.  She feels like her double vision worsens with general fatigue, but has been present constantly when both eyes are open for the past 2 days.  Sx resolve with either eye open by itself.  She denies headache, neck pain, color vision changes, weakness, numbness, confusion, speech deficits.   She was seen by the ophtho clinic this AM.  Underwent a dilated eye exam, then referred to [facility] for ED evaluation and possible MRI.   She appears well.  Has normal coordination when sitting/lying in bed (normal rapid alternating hand movements and heel to shin testing) but is very ataxic when standing.  + Romberg exam.  Requires assistance to ambulate at all due to unsteadiness.  Both pupils are significantly dilated due to the recent meds, but it appears like her left pupil may be ever slightly bigger than her right and it appears like her left eye is very slightly deviated down and in compared to her right eye.  No nystagmus.  20/200 visual acuity bilaterally due to dilated pupils.   Will obtain MRI brain without contrast to look for occular nerve palsy.   MRI returned negative for any acute changes.  Due to her  continued diplopia, will call neurology to discuss the patient.  They agree that her sx are likely secondary to diabetic partial left CN 4 palsy.  No acute treatment needed, and sx will likely improve with time.  Can follow up with her neurology clinic and PT as needed.  She can use an eye patch as needed when trying to read or watch TV, but doesn't need it unless sx are severe.  Patient informed of this.  All questions were  answered.  Discharged in stable condition.    Patient was seen with ED Attending, Dr. Lucia Gaskins, MD  Tori Milks, MD 11/04/14 1155  Quintella Reichert, MD 11/05/14 1016

## 2014-11-03 NOTE — Discharge Instructions (Signed)
Diplopia  °Double vision (diplopia) means that you are seeing two of everything. Diplopia usually occurs with both eyes open, and may be worse when looking in one particular direction. If both eyes must be open to see double, this is called binocular diplopia. If double images are seen in just one eye, this is called monocular diplopia.  °CAUSES  °Binocular Diplopia °· Disorder affecting the muscles that move the eyes or the nerves that control those muscles. °· Tumor or other mass pushing on an eye from beside or behind the eye. °· Myasthenia gravis. This is a neuromuscular illness that causes the body's muscles to tire easily. The eye muscles and eyelid muscles become weak. The eyes do not track together well. °· Grave's disease. This is an overactivity of the thyroid gland. This condition causes swelling of tissues around the eyes. This produces a bulging out of the eyeball. °· Blowout fracture of the bone around the eye. The muscles of the eye socket are damaged. This often happens when the eye is hit with force. °· Complications after certain surgeries, such as a lens implant after cataract surgery. °· Fluid-filled mass (abscess) behind or beside the eye, infection, or abnormal connection between arteries and veins. °Sometimes, no cause is found. °Monocular Diplopia °· Problems with corrective lens or contacts. °· Corneal abrasion, infection, severe injury, or bulging and irregularity of the corneal surface (keratoconus). °· Irregularities of the pupil from drugs, severe injury, or other causes. °· Problems involving the lens of the eye, such as opacities or cataracts. °· Complications after certain surgeries, such as a lens implant after cataract surgery. °· Retinal detachment or problems involving the blood vessels of the retina. °Sometimes, no cause is found. °SYMPTOMS  °Binocular Diplopia °· When one eye is closed or covered, the double images disappear. °Monocular Diplopia °· When the unaffected eye is  closed or covered, the double images remain. The double images disappear when the affected eye is closed or covered. °DIAGNOSIS  °A diagnosis is made during an eye exam. °TREATMENT  °Treatment depends on the cause or underlying disease.  °· Relief of double vision symptoms may be achieved by patching one eye or by using special glasses. °· Surgery on the muscles of the eye may be needed. °SEEK IMMEDIATE MEDICAL CARE IF:  °· You see two images of a single object you are looking at, either with both eyes open or with just one eye open. °Document Released: 04/27/2004 Document Revised: 09/18/2011 Document Reviewed: 02/12/2008 °ExitCare® Patient Information ©2015 [employer]. This information is not intended to replace advice given to you by your health care provider. Make sure you discuss any questions you have with your health care provider. ° °

## 2014-11-05 ENCOUNTER — Ambulatory Visit (INDEPENDENT_AMBULATORY_CARE_PROVIDER_SITE_OTHER): Payer: Medicare Other | Admitting: *Deleted

## 2014-11-05 ENCOUNTER — Ambulatory Visit: Payer: Medicare Other | Admitting: Internal Medicine

## 2014-11-05 VITALS — BP 118/66 | Temp 98.4°F

## 2014-11-05 DIAGNOSIS — Z23 Encounter for immunization: Secondary | ICD-10-CM

## 2014-11-05 NOTE — Progress Notes (Signed)
   Subjective:    Patient ID: [name] [name], female    DOB: [birth year], 74 y.o.   MRN: [id number]  HPI    Review of Systems     Objective:   Physical Exam        Assessment & Plan:  Patient here for Prevnar 13 vaccine only

## 2014-11-05 NOTE — Addendum Note (Signed)
Addended by: Alfredia Ferguson A on: 11/05/2014 04:06 PM   Modules accepted: Level of Service

## 2014-11-08 ENCOUNTER — Other Ambulatory Visit: Payer: Self-pay | Admitting: Internal Medicine

## 2014-11-10 ENCOUNTER — Ambulatory Visit: Payer: Medicare Other | Admitting: Physical Therapy

## 2014-11-17 ENCOUNTER — Ambulatory Visit (INDEPENDENT_AMBULATORY_CARE_PROVIDER_SITE_OTHER): Payer: Medicare Other | Admitting: Neurology

## 2014-11-17 ENCOUNTER — Encounter: Payer: Self-pay | Admitting: Neurology

## 2014-11-17 VITALS — BP 132/78 | HR 84 | Resp 20 | Ht 64.5 in | Wt 175.2 lb

## 2014-11-17 DIAGNOSIS — H532 Diplopia: Secondary | ICD-10-CM | POA: Insufficient documentation

## 2014-11-17 NOTE — Patient Instructions (Signed)
I can't say for sure what the exact cause is, since I did not evaluate you at the time.  However, it very well may have been an isolated 4th nerve palsy.  If it has gotten better, I wouldn't pursue other testing.  Monitor for any recurrence.  Follow up in 4 weeks.

## 2014-11-17 NOTE — Progress Notes (Signed)
NEUROLOGY FOLLOW UP OFFICE NOTE  [name] [name] [id number]  HISTORY OF PRESENT ILLNESS: [name] [name] is a 74 year old right-handed woman with type II diabetes mellitus, hypertension, arthritis, IBS, Factor V Leiden gene mutation and history of breast cancer whom I previously saw for diabetic neuropathy presents now for diplopia.  Records, labs and MRI of brain reviewed.  She has had gradually progressive vertical diplopia last month.  On 10/31/14, she noticed trouble focusing, which resolved after she blinked a couple of times.  The next day, she had vertical diplopia, which she noticed particularly when looking at the horizontal lights on the TV.  She also felt more unsteady.  She denied headache, facial droop, facial numbness, dizziness or unilateral numbness or weakness.  She denied ptosis or fluctuation of symptoms.  She had a formal ophthalmologic exam on 11/03/14, in which her eyes were dilated.  She was advised to go to the ED.  She presented to the [facility] ED.  Exam revealed bilateral dilated pupils, likely due to medication.  However, her left pupil seemed slightly bigger and was slightly deviated down compared to her right eye.  She had an MRI of the brain which showed no acute stroke or mass lesion.  The diplopia lasted a week and then resolved.  She just had cataract surgery on her left eye yesterday.  Most recent Hgb A1c from last month was 6.1  PAST MEDICAL HISTORY: Past Medical History  Diagnosis Date  . Hypertension   . Arthritis   . IBS (irritable bowel syndrome)   . Allergy   . Clotting disorder   . Diabetes mellitus   . Cancer     breast and skin    MEDICATIONS: Current Outpatient Prescriptions on File Prior to Visit  Medication Sig Dispense Refill  . aspirin 81 MG tablet Take 81 mg by mouth daily.    Marland Kitchen azelastine (ASTELIN) 0.1 % nasal spray Place 1 spray into both nostrils 2 (two) times daily as needed for rhinitis or allergies.     . Blood Glucose  Monitoring Suppl (ONE TOUCH ULTRA 2) W/DEVICE KIT Inject 1 application as directed as needed.    . busPIRone (BUSPAR) 30 MG tablet TAKE 1 TABLET TWICE DAILY. 180 tablet 1  . canagliflozin (INVOKANA) 100 MG TABS tablet Take 1 tablet (100 mg total) by mouth daily. 90 tablet 3  . cholecalciferol (VITAMIN D) 1000 UNITS tablet Take 2,000 Units by mouth daily.    Marland Kitchen conjugated estrogens (PREMARIN) vaginal cream Place 1 Applicatorful vaginally daily. 42.5 g 12  . desloratadine (CLARINEX) 5 MG tablet Take 5 mg by mouth daily.    . diazepam (VALIUM) 5 MG tablet TAKE 1/2 TABLET AT BEDTIME AS NEEDED. 30 tablet 3  . ECHINACEA EXTRACT PO Take 1 capsule by mouth daily as needed.    Marland Kitchen EPIPEN 2-PAK 0.3 MG/0.3ML SOAJ injection Inject 0.3 mLs as directed as needed. For allergic reaction    . fluticasone (FLONASE) 50 MCG/ACT nasal spray Place 2 sprays into the nose daily. 16 g 11  . FREESTYLE LITE test strip CHECK BLOOD SUGAR TWICE DAILY. 100 each 11  . furosemide (LASIX) 20 MG tablet TAKE 1 OR 2 TABLETS DAILY AS DIRECTED 90 tablet 0  . glipiZIDE (GLUCOTROL) 5 MG tablet TAKE (1) TABLET TWICE A DAY BEFORE MEALS. 60 tablet 1  . glucose blood (ONETOUCH VERIO) test strip Use to test blood sugar 2 times daily as instructed. Dx: E11.29 100 each 3  . ketotifen (ZADITOR)  0.025 % ophthalmic solution Place 1 drop into both eyes 2 (two) times daily as needed (allergies).    . Lancets (FREESTYLE) lancets Use to test blood sugar 2 times daily as instructed. Dx code: E11.29 100 each 11  . metFORMIN (GLUCOPHAGE) 500 MG tablet TAKE 2 TABLETS TWICE DAILY WITH MEALS. (Patient taking differently: take 1 tablet three times daily) 120 tablet 2  . Misc Natural Products (TART CHERRY ADVANCED PO) Take 1 capsule by mouth daily.    . montelukast (SINGULAIR) 10 MG tablet TAKE ONE TABLET AT BEDTIME. 90 tablet 0  . nystatin (MYCOSTATIN/NYSTOP) 100000 UNIT/GM POWD Apply 1 g topically 2 (two) times daily. 60 g 3  . ONETOUCH DELICA LANCETS 96Q  MISC Use to test blood sugar 2 times daily as instructed. Dx: E11.29 100 each 3  . TOPROL XL 100 MG 24 hr tablet TAKE 1 TABLET TWICE DAILY WITH FOOD. 180 tablet 1  . XIFAXAN 550 MG TABS tablet TAKE 1 TABLET TWICE DAILY. (Patient taking differently: TAKE 1 TABLET TWICE DAILY. AS NEEDED) 60 tablet 1   No current facility-administered medications on file prior to visit.    ALLERGIES: Allergies  Allergen Reactions  . Dust Mite Extract Swelling  . Fruit & Vegetable Daily [Nutritional Supplements] Diarrhea  . Onion Other (See Comments)    indigestion  . Tree Extract Swelling  . Sulfa Drugs Cross Reactors Rash    All over the body    FAMILY HISTORY: Family History  Problem Relation Age of Onset  . Heart disease Brother   . Heart disease Mother   . Heart disease Father     SOCIAL HISTORY: History   Social History  . Marital Status: Married    Spouse Name: N/A  . Number of Children: N/A  . Years of Education: N/A   Occupational History  . Not on file.   Social History Main Topics  . Smoking status: Never Smoker   . Smokeless tobacco: Never Used  . Alcohol Use: 0.0 oz/week    0 Standard drinks or equivalent per week     Comment: social  . Drug Use: No  . Sexual Activity: No   Other Topics Concern  . Not on file   Social History Narrative    REVIEW OF SYSTEMS: Constitutional: No fevers, chills, or sweats, no generalized fatigue, change in appetite Eyes: No visual changes, double vision, eye pain Ear, nose and throat: No hearing loss, ear pain, nasal congestion, sore throat Cardiovascular: No chest pain, palpitations Respiratory:  No shortness of breath at rest or with exertion, wheezes GastrointestinaI: No nausea, vomiting, diarrhea, abdominal pain, fecal incontinence Genitourinary:  No dysuria, urinary retention or frequency Musculoskeletal:  No neck pain, back pain Integumentary: No rash, pruritus, skin lesions Neurological: as above Psychiatric: No depression,  insomnia, anxiety Endocrine: No palpitations, fatigue, diaphoresis, mood swings, change in appetite, change in weight, increased thirst Hematologic/Lymphatic:  No anemia, purpura, petechiae. Allergic/Immunologic: no itchy/runny eyes, nasal congestion, recent allergic reactions, rashes  PHYSICAL EXAM: Filed Vitals:   11/17/14 1132  BP: 132/78  Pulse: 84  Resp: 20   General: No acute distress Head:  Normocephalic/atraumatic Eyes:  Fundoscopic exam unremarkable without vessel changes, exudates, hemorrhages or papilledema. Neck: supple, no paraspinal tenderness, full range of motion Heart:  Regular rate and rhythm Lungs:  Clear to auscultation bilaterally Back: No paraspinal tenderness Neurological Exam: alert and oriented to person, place, and time. Attention span and concentration intact, recent and remote memory intact, fund of knowledge intact.  Speech fluent and not dysarthric, language intact.  Pupils equal and round but left pupil is poorly reactive, possibly due to surgery.  Otherwise, CN II-XII intact. Fundoscopic exam unremarkable without vessel changes, exudates, hemorrhages or papilledema.  Bulk and tone normal, muscle strength 5/5 throughout.  Reduced pinprick sensation on bottom of feet.  Mild reduced vibration sensation in toes.  Deep tendon reflexes 2+ throughout except absent in ankles, toes downgoing.  Finger to nose and heel to shin testing intact.  Gait wide-based and unsteady, Romberg with sway.  IMPRESSION & PLAN: Vertical diplopia.  Since I did not examine her when she had the symptoms, I cannot comment too much.  However, it appears she had no other symptoms to suggest a stroke or TIA.  MRI was negative.  Since it was an isolated ophthalmoplegia, an ischemic left fourth nerve palsy is likely (given her age and stroke risk factors).  Based on symptoms, I don't suspect myasthenia gravis.  Since she does have a history of breast cancer, I suggested MRI of the brain with  contrast.  She does not wish to pursue this.  Since the symptoms have resolved, I wouldn't pursue any further testing.  I want to see her back in 4 weeks to make sure symptoms have not returned, in which case further testing would be indicated.  In the meantime, she should continue ASA 53m daily and continue to optimize glycemic control.  AMetta Clines DO  CC:  KRobyn Haber MD  RClent Jacks MD

## 2014-11-22 ENCOUNTER — Other Ambulatory Visit: Payer: Self-pay | Admitting: Physician Assistant

## 2014-11-23 ENCOUNTER — Encounter: Payer: Self-pay | Admitting: *Deleted

## 2014-11-24 ENCOUNTER — Ambulatory Visit: Payer: Medicare Other | Attending: Family Medicine

## 2014-11-24 ENCOUNTER — Other Ambulatory Visit: Payer: Self-pay | Admitting: Physician Assistant

## 2014-11-24 DIAGNOSIS — Z9181 History of falling: Secondary | ICD-10-CM | POA: Diagnosis not present

## 2014-11-24 DIAGNOSIS — R269 Unspecified abnormalities of gait and mobility: Secondary | ICD-10-CM | POA: Diagnosis not present

## 2014-11-24 NOTE — Therapy (Addendum)
[facility] [address] [place], [place], [address] Phone: [phone]   Fax:  [phone]  Physical Therapy Evaluation  Patient Details  Name: [name] [name] MRN: [id number] Date of Birth: 07/23/1940 Referring Provider:  Robyn Haber, MD  Encounter Date: 11/24/2014      PT End of Session - 11/24/14 1517    Visit Number 1   Number of Visits 16   Date for PT Re-Evaluation 01/24/15   PT Start Time 0350   PT Stop Time 1445   PT Time Calculation (min) 40 min   Equipment Utilized During Treatment Gait belt   Activity Tolerance Patient tolerated treatment well   Behavior During Therapy [facility] for tasks assessed/performed      Past Medical History  Diagnosis Date  . Hypertension   . Arthritis   . IBS (irritable bowel syndrome)   . Allergy   . Clotting disorder   . Diabetes mellitus   . Cancer     breast and skin    Past Surgical History  Procedure Laterality Date  . Mastectomy  2002    Bilateral  . Breast lumpectomy  1996  . Abdominal hysterectomy  1985    There were no vitals filed for this visit.  Visit Diagnosis:  Abnormality of gait  Risk for falls      Subjective Assessment - 11/24/14 1408    Subjective  pt reports she is here today due to the same reason she was here two years ago, for "gait problems." She reports her double vision has resolved since her visit to the ER. pt reports she had trouble focusing her vision on the 23rd of april and her symptoms subsided approximately two weeks later. pt reports opthamologist suggested it may have been a TIA.  pt reports her balance and steadiness with gait and slowly digressed since her last therapy intervention two years ago.   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  No/denies            [facility] PT Assessment - 11/24/14 1626    Assessment   Medical Diagnosis gait disturbance   Onset Date --  "years ago" with worsening of balance of the last few months   Prior Therapy --  two years ago at this clinic for gait/balance   Precautions   Precautions Fall   Restrictions   Weight Bearing Restrictions No   Balance Screen   Has the patient fallen in the past 6 months Yes   How many times? 1  getting up from bed   Has the patient had a decrease in activity level because of a fear of falling?  Yes   Is the patient reluctant to leave their home because of a fear of falling?  No   Home Environment   Living Enviornment Private residence   Living Arrangements Spouse/significant other   Type of [place] to enter   Entrance Stairs-Number of Steps 3  steep steps    Entrance Stairs-Rails Left   Home Layout Multi-level  pt tends to stay within two levels of house   Alternate Level Stairs-Rails Right   Home Equipment Cane - quad;Grab bars - tub/shower  hemiwalker    Prior Function   Level of Independence Independent with basic ADLs;Independent with gait;Independent with transfers  Cognition   Overall Cognitive Status Within Functional Limits for tasks assessed   Observation/Other Assessments   Focus on Therapeutic Outcomes (FOTO)  --  not completed   Sensation   Light Touch Appears Intact   Coordination   Gross Motor Movements are Fluid and Coordinated Not tested   Fine Motor Movements are Fluid and Coordinated Not tested   Posture/Postural Control   Posture/Postural Control Postural limitations   Postural Limitations Rounded Shoulders;Forward head;Posterior pelvic tilt   ROM / Strength   AROM / PROM / Strength AROM;Strength   AROM   Overall AROM  Deficits   Overall AROM Comments B heel cord: able to obtain neutral dorsiflexion.   Strength   Overall Strength Within functional limits for tasks performed   Overall Strength  Comments B UE WFL and B LE strength grossly: 5/5.   Flexibility   Hamstrings Decreased flexibililty   Quadriceps     Transfers   Transfers Sit to Stand;Stand to Sit   Sit to Stand 7: Independent   Stand to Sit 7: Independent   Ambulation/Gait   Ambulation/Gait Yes   Ambulation/Gait Assistance 5: Supervision   Ambulation/Gait Assistance Details --  no significant loss of balance noted    Ambulation Distance (Feet) 75 Feet   Assistive device None   Gait Pattern Decreased step length - right;Decreased step length - left;Right flexed knee in stance;Left flexed knee in stance;Decreased trunk rotation;Wide base of support;Poor foot clearance - left;Poor foot clearance - right   Ambulation Surface Level;Indoor   Gait velocity 2.18  ft/sec   Static Standing Balance   Static Standing - Balance Support No upper extremity supported   Static Standing - Level of Assistance 5: Stand by assistance   Static Standing - Comment/# of Minutes pt maintained narrow BOS (feet together) stance for 1 minute and modified tandem stance for approximately 10 seconds with difficulty obtaining the position   Standardized Balance Assessment   Standardized Balance Assessment Timed Up and Go Test   Timed Up and Go Test   Normal TUG (seconds) 12.04                             PT Short Term Goals - 11/24/14 1540    PT SHORT TERM GOAL #1   Title The patient will improve gait speed from 2.18 ft/sec up to 3 ft/sec to demo transition to "full community ambulator" (2.62 ft/sec is cut-off)  classification of gait. Target Date June 14th.    Time 4   Period Weeks   Status New   PT SHORT TERM GOAL #2   Title Pt will be independent with HEP to address balance, flexibility, and gait. Target Date June 14th.   Time 4   Period Weeks   Status New   PT SHORT TERM GOAL #3   Title Assess DGI at next treatment and establish appropriate STG and LTG. Target Date June 14th.    Time 4   Period Weeks   Status  New   PT SHORT TERM GOAL #4   Title Assess stair navigation at next treatment and establish appropriate STG and LTG. Target Date June 14th.    Time 4   Period Weeks   Status New   PT SHORT TERM GOAL #5   Title Demonstrate ability to ambulate 300' on level surfaces independently for decreased fall risk. Target date June 14th.    Time 4   Period Weeks   Status  New           PT Long Term Goals - 11/24/14 1539    PT LONG TERM GOAL #1   Title pt will verbalize understanding of fall prevention strategies in home environment. Target Date July 5th.    Time 8   Period Weeks   Status New   PT LONG TERM GOAL #2   Title pt will report no falls over the last 4 weeks to demonstrate improved safety with functional mobility. Target date July 5th.    Time 8   Period Weeks   Status New   PT LONG TERM GOAL #3   Title Demonstrate ability to ambulate 500' on level surfaces independently for decreased fall risk. Target date July 5th.    Time 8   Period Weeks   Status New               Plan - 11/24/14 1521    Clinical Impression Statement pt is a 74 y.o. female who wishes to improve her confidence with gait and mobility. Today she demonstrates limtations with balance and inefficency and unsteadiness with gait. pt demo delayed tracking ability during right tracking with saccades. During VOR testing pt reported symptoms on verge of onset, yet denied dizziness. pt also had trouble focusingduring VOR assessment. pt self reports being cautious with gait and her caution has limited her ability to fully participate in the community (participate in silver sneakers). Recommned skilled physical therapy to addess patient's gait, balance, and flexibility deficits so she can increase her confidence with mobilty and decrease her risk for falls.     Pt will benefit from skilled therapeutic intervention in order to improve on the following deficits Abnormal gait;Decreased coordination;Difficulty  walking;Impaired flexibility;Postural dysfunction;Decreased balance;Decreased knowledge of use of DME;Decreased mobility;Impaired perceived functional ability   Rehab Potential Good   Clinical Impairments Affecting Rehab Potential pt reports her opthamologist told her she may have had recent TIA   PT Frequency 2x / week   PT Duration 8 weeks   PT Treatment/Interventions ADLs/Self Care Home Management;Therapeutic activities;Patient/family education;Visual/perceptual remediation/compensation;DME Instruction;Therapeutic exercise;Biofeedback;Gait training;Balance training;Electrical Stimulation;Functional mobility training;Neuromuscular re-education;Stair training   PT Next Visit Plan perform DGI and establish appropriate STG and LTG; evaluate stair technique and establish appropriate STG and LTG; establish HEP which addresses balance and LE flexibility deficits    Consulted and Agree with Plan of Care Patient         Problem List Patient Active Problem List   Diagnosis Date Noted  . Vertical diplopia 11/17/2014  . Non-melanoma skin cancer 05/13/2014  . Uncontrolled type 2 diabetes mellitus with nephropathy 04/22/2014  . Seasonal allergies 09/12/2011  . Anxiety 09/12/2011  . HTN (hypertension) 09/12/2011  . Breast cancer 09/12/2011  . GERD (gastroesophageal reflux disease) 09/12/2011  . Dyslipidemia 09/12/2011  . Osteopenia 09/12/2011  . DJD (degenerative joint disease) 09/12/2011  . Factor 5 Leiden mutation, heterozygous 09/12/2011  . Personal history of colonic polyps 09/12/2011    Fanny Dance 11/24/2014, 4:44 PM  [facility] [address] [place], [place], [address] Phone: [phone]   Fax:  [phone]   Geoffry Paradise, PT,DPT 11/24/2014 7:31 PM Phone: [phone] Fax: [phone]

## 2014-11-26 ENCOUNTER — Ambulatory Visit: Payer: Medicare Other | Admitting: Rehabilitative and Restorative Service Providers"

## 2014-11-27 ENCOUNTER — Telehealth: Payer: Self-pay

## 2014-11-27 DIAGNOSIS — K589 Irritable bowel syndrome without diarrhea: Secondary | ICD-10-CM

## 2014-11-27 NOTE — Telephone Encounter (Signed)
PA is needed for Xifaxan 550 mg. Dr L, I see that you have Rxd this for pt w/RFs, but don't see what it is Rxd for in order to do PA. Is it a medication pt stays on continuously? Do you know if she has tried/failed any other meds for dx?

## 2014-11-27 NOTE — Telephone Encounter (Signed)
I don't understand who prescribed this.  Can you please call pharmacy to find out?

## 2014-11-30 ENCOUNTER — Other Ambulatory Visit: Payer: Self-pay | Admitting: Family Medicine

## 2014-11-30 ENCOUNTER — Ambulatory Visit: Payer: Medicare Other | Admitting: Rehabilitative and Restorative Service Providers"

## 2014-11-30 DIAGNOSIS — R269 Unspecified abnormalities of gait and mobility: Secondary | ICD-10-CM

## 2014-11-30 DIAGNOSIS — Z9181 History of falling: Secondary | ICD-10-CM

## 2014-11-30 MED ORDER — RIFAXIMIN 550 MG PO TABS: 550.0000 mg | ORAL_TABLET | Freq: Two times a day (BID) | ORAL | Status: DC

## 2014-11-30 NOTE — Patient Instructions (Signed)
  Gaze Stabilization: Tip Card 1.Target must remain in focus, not blurry, and appear stationary while head is in motion. 2.Perform exercises with small head movements (45 to either side of midline). 3.Increase speed of head motion so long as target is in focus. 4.If you wear eyeglasses, be sure you can see target through lens (therapist will give specific instructions for bifocal / progressive lenses). 5.These exercises may provoke dizziness or nausea. Work through these symptoms. If too dizzy, slow head movement slightly. Rest between each exercise. 6.Exercises demand concentration; avoid distractions. 7.For safety, perform standing exercises close to a counter, wall, corner, or next to someone.  Copyright  VHI. All rights reserved.  Gaze Stabilization: Sitting   Keeping eyes on target on wall 3 feet away, tilt head down slightly and move head side to side for _30___ seconds. Repeat while moving head up and down for _30___ seconds. Do __2-3__ sessions per day. Repeat using target on pattern background.  Copyright  VHI. All rights reserved.  Heel Cord Stretch   NEAR COUNTERTOP FOR SUPPORT* Place one leg forward, bent, other leg behind and straight. Lean forward keeping back heel flat. Hold _30___ seconds while counting out loud. Repeat with other leg. Repeat __3__ times. Do __1-2__ sessions per day.  http://[email or web address].exer.us/512   AMBULATION: Side Step   NEAR COUNTERTOP FOR SUPPORT. Step sideways. Repeat in opposite direction. _4__ reps per set, 1-2 times per day.  Copyright  VHI. All rights reserved.   Feet Heel-Toe "Tandem"   [place], walk a straight line bringing one foot directly in front of the other. Repeat for 4 reps along countertop. Do ___1-2_ sessions per day.  Copyright  VHI. All rights reserved.   Feet Together, Head Motion - Eyes Open   With eyes open, feet together, move head slowly: up and down x 10 times, then side to side x 10 times,  then diagonally looking up to right/ down to left x 10, then diagonally looking up to left/ down to right x 10.    Do _1-2___ sessions per day.  Copyright  VHI. All rights reserved.    Feet Together, Varied Arm Positions - Eyes Closed   Stand with feet together and arms out. Close eyes and visualize upright position. Hold __30__ seconds. Repeat _3___ times per session. Do __1-2__ sessions per day.  Copyright  VHI. All rights reserved.

## 2014-11-30 NOTE — Therapy (Signed)
[facility] [address] [place], [place], [address] Phone: [phone]   Fax:  [phone]  Physical Therapy Treatment  Patient Details  Name: [name] [name] MRN: [id number] Date of Birth: 1941-01-12 Referring Provider:  Robyn Haber, MD  Encounter Date: 11/30/2014      PT End of Session - 11/30/14 1554    Visit Number 2   Number of Visits 16   Date for PT Re-Evaluation 01/24/15   PT Start Time 1105   PT Stop Time 1145   PT Time Calculation (min) 40 min   Equipment Utilized During Treatment Gait belt   Activity Tolerance Patient tolerated treatment well   Behavior During Therapy [facility] for tasks assessed/performed      Past Medical History  Diagnosis Date  . Hypertension   . Arthritis   . IBS (irritable bowel syndrome)   . Allergy   . Clotting disorder   . Diabetes mellitus   . Cancer     breast and skin    Past Surgical History  Procedure Laterality Date  . Mastectomy  2002    Bilateral  . Breast lumpectomy  1996  . Abdominal hysterectomy  1985    There were no vitals filed for this visit.  Visit Diagnosis:  Abnormality of gait  Risk for falls      Subjective Assessment - 11/30/14 1112    Subjective pt reports no changes since she was last here for her inital evaluation. pt still reports she is feeling unsteady with gait. pt denies any falls or loss of balance.    Patient Stated Goals building confidence with ambulation and stairs    Currently in Pain? No/denies            [facility] PT Assessment - 11/30/14 1122    Standardized Balance Assessment   Standardized Balance Assessment Dynamic Gait Index   Dynamic Gait Index   Level Surface Mild Impairment   Change in Gait Speed Mild Impairment   Gait with Horizontal Head Turns Moderate Impairment   Gait with Vertical Head Turns Moderate Impairment   Gait and Pivot Turn Normal   Step Over Obstacle Mild Impairment   Step Around  Obstacles Mild Impairment   Steps Mild Impairment   Total Score 15        Treatment  Patient Education:  Pt instructed and completed full HEP, refer to Patient Instructions for specifics with exercises.   Neuro Re-education: -Braided gait at counter for balance and coordination x 4 laps with CGA for safety -Eye hand coordination "eyes, then head" spotting targets. Pt instructed to perform slight eye movements between thumbs, and then instructed to turn head in same direction without losing focus. X 5 reps  Gait Training: -amb 75 ft on indoor level surface with CGA and cues for head turns (both visual and verbal cues utilized to promote adequate head ROM)          PT Education - 11/30/14 1549    Education provided Yes   Education Details pt instructed and completed HEP today which included: gaze habituation exercises with card, standing heelcord stretch, side stepping at counter, tandem walking at counter, feet togther in corner with head motions, feet together in corner with eyes closed   Person(s) Educated Patient   Methods Explanation;Demonstration;Verbal cues;Handout   Comprehension Verbalized understanding;Returned demonstration          PT Short Term Goals - 11/30/14 1551    PT SHORT TERM GOAL #1  Title The patient will improve gait speed from 2.18 ft/sec up to 3 ft/sec to demo transition to "full community ambulator" (2.62 ft/sec is cut-off)  classification of gait. Target Date June 14th.    Time 4   Period Weeks   Status --   PT SHORT TERM GOAL #2   Title Pt will be independent with HEP to address balance, flexibility, and gait. Target Date June 14th.   Time 4   Period Weeks   Status --   PT SHORT TERM GOAL #3   Title Improve DGI from 15/24 up to 19/24 to demo decreasing risk for falls.  Target date 12/22/2014   Baseline --   Time 4   Period Weeks   Status Revised   PT SHORT TERM GOAL #4   Title Pt will negotiate stairs with single hand rail modified indep  with reciprocal gait pattern on steps.  Target Date June 14th.    Baseline --   Time 4   Period Weeks   Status Revised   PT SHORT TERM GOAL #5   Title Demonstrate ability to ambulate 300' on level surfaces independently for decreased fall risk. Target date June 14th.    Time 4   Period Weeks   Status --           PT Long Term Goals - 11/24/14 1539    PT LONG TERM GOAL #1   Title pt will verbalize understanding of fall prevention strategies in home environment. Target Date July 5th.    Time 8   Period Weeks   Status New   PT LONG TERM GOAL #2   Title pt will report no falls over the last 4 weeks to demonstrate improved safety with functional mobility. Target date July 5th.    Time 8   Period Weeks   Status New   PT LONG TERM GOAL #3   Title Demonstrate ability to ambulate 500' on level surfaces independently for decreased fall risk. Target date July 5th.    Time 8   Period Weeks   Status New               Plan - 11/30/14 1555    Clinical Impression Statement pt tolerated treatment session well and had good response to HEP instruction. pt had a DGI score today of 15, demonstrating mild impairments specifically with head turns, changes in gait speed, obstacles and stairs. pt is very guarded with ambulation and continues to demonstrate wide base of support and limited arm swing.    Pt will benefit from skilled therapeutic intervention in order to improve on the following deficits Abnormal gait;Decreased coordination;Difficulty walking;Impaired flexibility;Postural dysfunction;Decreased balance;Decreased knowledge of use of DME;Decreased mobility;Impaired perceived functional ability   Rehab Potential Good   PT Frequency 2x / week   PT Duration 8 weeks   PT Treatment/Interventions ADLs/Self Care Home Management;Therapeutic activities;Patient/family education;Visual/perceptual remediation/compensation;DME Instruction;Therapeutic exercise;Biofeedback;Gait training;Balance  training;Electrical Stimulation;Functional mobility training;Neuromuscular re-education;Stair training   PT Next Visit Plan review HEP; balance activites in corner, dynamic gait training emphasizing trunk rotations and head turns   Consulted and Agree with Plan of Care Patient        Problem List Patient Active Problem List   Diagnosis Date Noted  . Vertical diplopia 11/17/2014  . Non-melanoma skin cancer 05/13/2014  . Uncontrolled type 2 diabetes mellitus with nephropathy 04/22/2014  . Seasonal allergies 09/12/2011  . Anxiety 09/12/2011  . HTN (hypertension) 09/12/2011  . Breast cancer 09/12/2011  . GERD (gastroesophageal reflux disease)  09/12/2011  . Dyslipidemia 09/12/2011  . Osteopenia 09/12/2011  . DJD (degenerative joint disease) 09/12/2011  . Factor 5 Leiden mutation, heterozygous 09/12/2011  . Personal history of colonic polyps 09/12/2011  This entire session was performed under direct supervision and direction of a licensed therapist/therapist assistant . I have personally read, edited and approve of the note as written. Fanny Dance, SPT   [place], PT 12/01/2014, 10:41 AM  [facility] [address] [place], [place], [address] Phone: [phone]   Fax:  [phone]

## 2014-11-30 NOTE — Telephone Encounter (Signed)
Pharm reported following: [facility] 06/08/14, [facility] 01/15/14, [facility] 09/2012, then (probably originally) Rxd by Dr Collene Mares in 2012.

## 2014-12-01 DIAGNOSIS — K589 Irritable bowel syndrome without diarrhea: Secondary | ICD-10-CM | POA: Insufficient documentation

## 2014-12-01 NOTE — Telephone Encounter (Signed)
Dr L sent in another Rx for Xifaxan, but didn't understand that info for a PA was needed. Checked pt's paper chart and see Dx of IBS. Called pt and she verified that xifaxan was Rxd for IBS. She stated that she doesn't take it all the time, just as needed and only had it filled a couple times last yr. She doesn't remember any other Rxs tried before, but was started with therapy with daily Metamucil and has also tried/failed immodium. Pt also uses probiotics regularly. Completed PA over the phone and is pending medical review. The preferred alternative is dicyclonine and I doubt that PA will be approved w/out pt first trying this.

## 2014-12-02 ENCOUNTER — Ambulatory Visit: Payer: Medicare Other

## 2014-12-02 DIAGNOSIS — R269 Unspecified abnormalities of gait and mobility: Secondary | ICD-10-CM | POA: Diagnosis not present

## 2014-12-02 DIAGNOSIS — Z9181 History of falling: Secondary | ICD-10-CM

## 2014-12-02 NOTE — Therapy (Signed)
[facility] [address] [place], [place], [address] Phone: [phone]   Fax:  [phone]  Physical Therapy Treatment  Patient Details  Name: [name] [name] MRN: [id number] Date of Birth: January 05, 1941 Referring Provider:  Robyn Haber, MD  Encounter Date: 12/02/2014      PT End of Session - 12/02/14 1627    Visit Number 3   Number of Visits 16   Date for PT Re-Evaluation 01/24/15   PT Start Time 4854   PT Stop Time 1530   PT Time Calculation (min) 36 min   Equipment Utilized During Treatment Gait belt   Activity Tolerance Patient tolerated treatment well   Behavior During Therapy [facility] for tasks assessed/performed      Past Medical History  Diagnosis Date  . Hypertension   . Arthritis   . IBS (irritable bowel syndrome)   . Allergy   . Clotting disorder   . Diabetes mellitus   . Cancer     breast and skin    Past Surgical History  Procedure Laterality Date  . Mastectomy  2002    Bilateral  . Breast lumpectomy  1996  . Abdominal hysterectomy  1985    There were no vitals filed for this visit.  Visit Diagnosis:  Abnormality of gait  Risk for falls      Subjective Assessment - 12/02/14 1455    Subjective pt reports she participated in [employer] on Friday as well attended a Tai Chi class yesterday. She reports no pain today. pt states she has not had time to do HEP due to her other exercise activities. pt reports during her last physical therapy intervention at another clinic she was told she is doing too much exercise.    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 No/denies       Treatment  Gait Training: -Ambulation on level surfaces with emphasis on arm swing and trunk rotation x 230 ft x 2 reps with CGA  for safety -Ambulation on level surfaces using ski poles to promote arm and stride length x 114 ft with CGA for safety -Gait training on level surfaces with emphasis on head turns. Pt instructed to turn and look at visual cues during gait to promote head movements with amb. CGA for safety.   -Gait training 30 feet over 3" hurdles on ground with cues to maintain gait velocity when stepping x 4 reps. CGA for safety.   Neuro Re-education: - balance training at stairs: toe taps onto first two steps of stairs, 10 reps on left and 10 reps on right, emphasizing controlled movements with accurate placement of foot without UE support and CGA for safety. -Ladder stepping x 4 reps with CGA for safety. Pt instructed to place one foot in each square of the ladder at time to promote smaller BOS with gait.   -Alternating anterior/posterior pelvic tilt sitting on physioball with CGA assist for stabilization. 10 reps. -Alternating lateral hip hikes sitting on physioball with CGA assist for stabilization. 10 reps.  -Static balance training standing at counter on with feet together. Pt practiced turning trunk and reaching for cones on counter and then turning trunk again to place cones on opposite side of trunk with without losing balance.  -Balance training at counter with emphasis on trunk rotations: pt instructed to reach for  targets on cabinet incorporating trunk turns and elongations with movements. 10 reps.   Therapeutic Exercise:  -Supine low back stretch in trunk rotation position. 20 second hold bilaterally.                           PT Short Term Goals - 11/30/14 1551    PT SHORT TERM GOAL #1   Title The patient will improve gait speed from 2.18 ft/sec up to 3 ft/sec to demo transition to "full community ambulator" (2.62 ft/sec is cut-off)  classification of gait. Target Date June 14th.    Time 4   Period Weeks   Status --   PT SHORT TERM GOAL #2   Title Pt will be independent  with HEP to address balance, flexibility, and gait. Target Date June 14th.   Time 4   Period Weeks   Status --   PT SHORT TERM GOAL #3   Title Improve DGI from 15/24 up to 19/24 to demo decreasing risk for falls.  Target date 12/22/2014   Baseline --   Time 4   Period Weeks   Status Revised   PT SHORT TERM GOAL #4   Title Pt will negotiate stairs with single hand rail modified indep with reciprocal gait pattern on steps.  Target Date June 14th.    Baseline --   Time 4   Period Weeks   Status Revised   PT SHORT TERM GOAL #5   Title Demonstrate ability to ambulate 300' on level surfaces independently for decreased fall risk. Target date June 14th.    Time 4   Period Weeks   Status --           PT Long Term Goals - 11/24/14 1539    PT LONG TERM GOAL #1   Title pt will verbalize understanding of fall prevention strategies in home environment. Target Date July 5th.    Time 8   Period Weeks   Status New   PT LONG TERM GOAL #2   Title pt will report no falls over the last 4 weeks to demonstrate improved safety with functional mobility. Target date July 5th.    Time 8   Period Weeks   Status New   PT LONG TERM GOAL #3   Title Demonstrate ability to ambulate 500' on level surfaces independently for decreased fall risk. Target date July 5th.    Time 8   Period Weeks   Status New               Plan - 12/02/14 1628    Clinical Impression Statement Today's treatment emphasized trunk rotation with reciprocal arm during during gait. Good response with trunk rotation stretch in supine and trunk rotation activities. pt encouraged at end of session to try and incorportate HEP exercises into day, even if it is one or two exercsies a day.    Pt will benefit from skilled therapeutic intervention in order to improve on the following deficits Abnormal gait;Decreased coordination;Difficulty walking;Impaired flexibility;Postural dysfunction;Decreased balance;Decreased knowledge of use of  DME;Decreased mobility;Impaired perceived functional ability   Rehab Potential Good   PT Frequency 2x / week   PT Duration 8 weeks   PT Treatment/Interventions ADLs/Self Care Home Management;Therapeutic activities;Patient/family education;Visual/perceptual remediation/compensation;DME Instruction;Therapeutic exercise;Biofeedback;Gait training;Balance training;Electrical Stimulation;Functional mobility training;Neuromuscular re-education;Stair training   PT Next Visit Plan review HEP; balance activites in corner, dynamic gait training emphasizing trunk rotations and head turns as well as narrowing pt's BOS during  gait   Consulted and Agree with Plan of Care Patient        Problem List Patient Active Problem List   Diagnosis Date Noted  . IBS (irritable bowel syndrome) 12/01/2014  . Vertical diplopia 11/17/2014  . Non-melanoma skin cancer 05/13/2014  . Uncontrolled type 2 diabetes mellitus with nephropathy 04/22/2014  . Seasonal allergies 09/12/2011  . Anxiety 09/12/2011  . HTN (hypertension) 09/12/2011  . Breast cancer 09/12/2011  . GERD (gastroesophageal reflux disease) 09/12/2011  . Dyslipidemia 09/12/2011  . Osteopenia 09/12/2011  . DJD (degenerative joint disease) 09/12/2011  . Factor 5 Leiden mutation, heterozygous 09/12/2011  . Personal history of colonic polyps 09/12/2011    Fanny Dance 12/02/2014, 4:32 PM  [facility] [address] [place], [place], [address] Phone: [phone]   Fax:  [phone]

## 2014-12-02 NOTE — Telephone Encounter (Signed)
PA denied, pt must have tried/failed dicyclonine first. Do you want to order that for pt? I didn't pend bc not sure what strength you would want to order.

## 2014-12-02 NOTE — Telephone Encounter (Signed)
Patient failed dicyclomine in the remote past.  Xifaxan is recommended when dicyclimne has failed

## 2014-12-03 NOTE — Telephone Encounter (Signed)
PATIENT CALLED TO SPEAK WITH BARBARA. [name] [name] STATES THAT BARBARA TOLD HER THAT THE INSURANCE COMPANY WOULD PROBABLY DENY PRE-AUTH. ON THE XIFAXAN 550 MG FOR HER IBS. THE PHARMACIST CALLED HER LAST NIGHT AND CONFIRMED THAT THEY DID. THEY TOLD HER TO ASK [employer]A IF SHE WOULD APPEAL THE DENIAL OR SHE WILL NEED TO HAVE SOMETHING ELSE PRESCRIBED. BEST PHONE [phone] (CELL)  PHARMACY CHOICE IS [facility].  [place]

## 2014-12-07 NOTE — Telephone Encounter (Signed)
Tried to re-submit covermymeds form and would not allow me bc ins found more than one pt w/name and DOB. Tried to call ins to re- do PA and they were closed for holiday. Need to find out if we can re-do the PA w/new info or if we have to appeal it?

## 2014-12-08 ENCOUNTER — Ambulatory Visit: Payer: Medicare Other | Admitting: Physical Therapy

## 2014-12-08 DIAGNOSIS — R269 Unspecified abnormalities of gait and mobility: Secondary | ICD-10-CM | POA: Diagnosis not present

## 2014-12-08 DIAGNOSIS — Z9181 History of falling: Secondary | ICD-10-CM

## 2014-12-08 NOTE — Therapy (Signed)
[facility] [address] [place], [place], [address] Phone: [phone]   Fax:  [phone]  Physical Therapy Treatment  Patient Details  Name: [name] [name] MRN: [id number] Date of Birth: 12/07/1940 Referring Provider:  Robyn Haber, MD  Encounter Date: 12/08/2014      PT End of Session - 12/08/14 1618    Visit Number 4   Number of Visits 16   Date for PT Re-Evaluation 01/24/15   PT Start Time 5974   PT Stop Time 1530   PT Time Calculation (min) 39 min   Equipment Utilized During Treatment Gait belt   Activity Tolerance Patient tolerated treatment well   Behavior During Therapy [facility] for tasks assessed/performed      Past Medical History  Diagnosis Date  . Hypertension   . Arthritis   . IBS (irritable bowel syndrome)   . Allergy   . Clotting disorder   . Diabetes mellitus   . Cancer     breast and skin    Past Surgical History  Procedure Laterality Date  . Mastectomy  2002    Bilateral  . Breast lumpectomy  1996  . Abdominal hysterectomy  1985    There were no vitals filed for this visit.  Visit Diagnosis:  Abnormality of gait  Risk for falls      Subjective Assessment - 12/08/14 1453    Subjective pt reports she completed HEP this week and had difficulty with tandem walking and standing with feet together. pt reports no pain and no recent falls.    Patient Stated Goals building confidence with ambulation and stairs    Currently in Pain? No/denies       Treatment    Gait Training:  -Ambulation on level surfaces using ski poles to promote arm and stride length x 114 ft with CGA for safety -Gait training on level surfaces with emphasis on head turns. Pt instructed to turn and look at visual cues during gait to promote head movements with amb. CGA for safety.     Neuromuscular Re-education:   -Balance and coordination training at stairs: toe taps onto first two steps of stairs,  10 reps on left and 10 reps on right, emphasizing controlled movements with accurate placement of foot without UE support and CGA for safety -Static balance training standing at counter on with feet together. Pt practiced turning trunk and reaching for cones on counter and then turning trunk again to place cones on opposite side of trunk with without losing balance. Cues provided for pt to not lean on back of counter for support/stability and maintain feet together.  -Balance training at counter with emphasis on trunk rotations: pt instructed to reach for targets on cabinet incorporating trunk turns and elongations with movements. 10 reps. -Modified 4 square stepping over 3 inch hurdles. Pt instructed to bring feet completely together after each step into square. CGA for safey. -Backwards walking along counter x 4 laps with CGA for safety. Cues given for longer step lengths.   Therapeutic Exercise:  -Supine low back stretch in trunk rotation position. 20 second hold bilaterally.                    PT Education - 12/08/14 1615    Education provided Yes   Education Details cervical ROM exercises added to HEP today including seated upper trap stretch and SCM stretch; pt instructed on cross friction techniques for L knee pain from two years ago when pt "  gashed" her knee and had stiches    Person(s) Educated Patient   Methods Explanation;Demonstration;Tactile cues   Comprehension Verbalized understanding;Returned demonstration          PT Short Term Goals - 11/30/14 1551    PT SHORT TERM GOAL #1   Title The patient will improve gait speed from 2.18 ft/sec up to 3 ft/sec to demo transition to "full community ambulator" (2.62 ft/sec is cut-off)  classification of gait. Target Date June 14th.    Time 4   Period Weeks   Status --   PT SHORT TERM GOAL #2   Title Pt will be independent with HEP to address balance, flexibility, and gait. Target Date June 14th.   Time 4   Period Weeks    Status --   PT SHORT TERM GOAL #3   Title Improve DGI from 15/24 up to 19/24 to demo decreasing risk for falls.  Target date 12/22/2014   Baseline --   Time 4   Period Weeks   Status Revised   PT SHORT TERM GOAL #4   Title Pt will negotiate stairs with single hand rail modified indep with reciprocal gait pattern on steps.  Target Date June 14th.    Baseline --   Time 4   Period Weeks   Status Revised   PT SHORT TERM GOAL #5   Title Demonstrate ability to ambulate 300' on level surfaces independently for decreased fall risk. Target date June 14th.    Time 4   Period Weeks   Status --           PT Long Term Goals - 11/24/14 1539    PT LONG TERM GOAL #1   Title pt will verbalize understanding of fall prevention strategies in home environment. Target Date July 5th.    Time 8   Period Weeks   Status New   PT LONG TERM GOAL #2   Title pt will report no falls over the last 4 weeks to demonstrate improved safety with functional mobility. Target date July 5th.    Time 8   Period Weeks   Status New   PT LONG TERM GOAL #3   Title Demonstrate ability to ambulate 500' on level surfaces independently for decreased fall risk. Target date July 5th.    Time 8   Period Weeks   Status New               Plan - 12/08/14 1619    Clinical Impression Statement pt demonstrated improved arm swing during gait activities today, however is still demonstrating significant guarding posture during gait and balance activities. Taking backwards steps and backwards walking is quite difficult for patient due to guarded posture.    Pt will benefit from skilled therapeutic intervention in order to improve on the following deficits Abnormal gait;Decreased coordination;Difficulty walking;Impaired flexibility;Postural dysfunction;Decreased balance;Decreased knowledge of use of DME;Decreased mobility;Impaired perceived functional ability   Rehab Potential Good   PT Frequency 2x / week   PT Duration 8  weeks   PT Treatment/Interventions ADLs/Self Care Home Management;Therapeutic activities;Patient/family education;Visual/perceptual remediation/compensation;DME Instruction;Therapeutic exercise;Biofeedback;Gait training;Balance training;Electrical Stimulation;Functional mobility training;Neuromuscular re-education;Stair training   PT Next Visit Plan static and dynamic balance training; gait training emphasizing trunk roations and head turns, coordination activities   Consulted and Agree with Plan of Care Patient        Problem List Patient Active Problem List   Diagnosis Date Noted  . IBS (irritable bowel syndrome) 12/01/2014  . Vertical diplopia 11/17/2014  .  Non-melanoma skin cancer 05/13/2014  . Uncontrolled type 2 diabetes mellitus with nephropathy 04/22/2014  . Seasonal allergies 09/12/2011  . Anxiety 09/12/2011  . HTN (hypertension) 09/12/2011  . Breast cancer 09/12/2011  . GERD (gastroesophageal reflux disease) 09/12/2011  . Dyslipidemia 09/12/2011  . Osteopenia 09/12/2011  . DJD (degenerative joint disease) 09/12/2011  . Factor 5 Leiden mutation, heterozygous 09/12/2011  . Personal history of colonic polyps 09/12/2011    Fanny Dance 12/08/2014, 4:27 PM  [facility] [address] [place], [place], [address] Phone: [phone]   Fax:  [phone]

## 2014-12-10 ENCOUNTER — Ambulatory Visit: Payer: Medicare Other | Attending: Family Medicine | Admitting: Physical Therapy

## 2014-12-10 DIAGNOSIS — R269 Unspecified abnormalities of gait and mobility: Secondary | ICD-10-CM | POA: Diagnosis present

## 2014-12-10 DIAGNOSIS — Z9181 History of falling: Secondary | ICD-10-CM | POA: Diagnosis present

## 2014-12-10 DIAGNOSIS — R531 Weakness: Secondary | ICD-10-CM | POA: Diagnosis present

## 2014-12-10 NOTE — Telephone Encounter (Signed)
Called [employer] and was advised that an appeal letter will have to be written w/this new info and faxed to [phone]. Completed letter and faxed to [place]. Pending.

## 2014-12-10 NOTE — Therapy (Signed)
[place] [address] [place], [place], [address] Phone: [phone]   Fax:  [phone]  Physical Therapy Treatment  Patient Details  Name: [name] [name] MRN: [id number] Date of Birth: 08-16-1940 Referring Provider:  Robyn Haber, MD  Encounter Date: 12/10/2014      PT End of Session - 12/10/14 1637    Visit Number 5   Number of Visits 16   Date for PT Re-Evaluation 01/24/15   PT Start Time 1245   PT Stop Time 1620   PT Time Calculation (min) 46 min   Equipment Utilized During Treatment Gait belt   Activity Tolerance Patient tolerated treatment well   Behavior During Therapy [facility] for tasks assessed/performed      Past Medical History  Diagnosis Date  . Hypertension   . Arthritis   . IBS (irritable bowel syndrome)   . Allergy   . Clotting disorder   . Diabetes mellitus   . Cancer     breast and skin    Past Surgical History  Procedure Laterality Date  . Mastectomy  2002    Bilateral  . Breast lumpectomy  1996  . Abdominal hysterectomy  1985    There were no vitals filed for this visit.  Visit Diagnosis:  Abnormality of gait  Risk for falls  Weakness generalized      Subjective Assessment - 12/10/14 1537    Subjective Pt reports no recent falls. Pt reports decreased confidence with descending stairs, difficulty standing up from low chairs, and pt perception of "loss of control" when walking over declined sidewalk when leaving [employer]ne[employer].   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 No/denies      Gait training: - Gait 2 x230' over indoor, level surfaces without AD with verbal/tactile cueing for increased reciprocal arm swing, trunk rotation. - Gait x575' indoors, no AD with vertical/horziontal head turns, change in gait speed with supervision and no overt LOB. Pt politely  declined gait training over outdoor, unlevel surfaces due to seasonal allergies.   Therex: - Pt negotiated 4 standard stairs with single R rail (to simulate home entrance) with reciprocal pattern; supervision required due to decreased stability during descent, which appeared to be secondary to decreased motor control in B quadriceps. To assess functional strength/endurance in B quadriceps during stair descent, performed heel dips on 7.5" step with single UE support x10 reps on R side, x7 reps on L side prior to significant posterior LOB due to L knee buckling requiring mod A to recover balance. - Squats x15 reps with verbal/demonstration cueing to decrease quadriceps dominance.  - Supine lower trunk rotations 2 x30 reps total to decrease muscle guarding, increase trunk rotation during ambulation. - Sit <> stand x12 reps from low mat table with cueing for setup with emphasis on scooting to EOM to decrease posterior pelvic tilt, B foot placement, and full anterior weight shift with effective within-session carryover.   Neuro Re-ed: - Pt performed functional reaching (laterally, across midline for increased trunk rotation) while seated on physioball to promote active sitting. Cueing focused on postural control during reaching activity. Pt consistently required min-mod A to recover from LOB to R side with L UE reaching across midline.  - Standing at parallel bars on large rocker board (oriented for A/P instability) pt engaged in throwing/catching ball for balance perturbations with CGA to Min A. Initially, noted delayed/ineffective hip  strategy with anterior LOB.           PT Education - 12/10/14 1636    Education provided Yes   Education Details Setup, technique for sit > stand from lower surfaces. Logroll technique for supine <> sit for increased efficiency of movement   Person(s) Educated Patient   Methods Explanation;Demonstration   Comprehension Verbalized understanding;Returned demonstration           PT Short Term Goals - 11/30/14 1551    PT SHORT TERM GOAL #1   Title The patient will improve gait speed from 2.18 ft/sec up to 3 ft/sec to demo transition to "full community ambulator" (2.62 ft/sec is cut-off)  classification of gait. Target Date June 14th.    Time 4   Period Weeks   Status --   PT SHORT TERM GOAL #2   Title Pt will be independent with HEP to address balance, flexibility, and gait. Target Date June 14th.   Time 4   Period Weeks   Status --   PT SHORT TERM GOAL #3   Title Improve DGI from 15/24 up to 19/24 to demo decreasing risk for falls.  Target date 12/22/2014   Baseline --   Time 4   Period Weeks   Status Revised   PT SHORT TERM GOAL #4   Title Pt will negotiate stairs with single hand rail modified indep with reciprocal gait pattern on steps.  Target Date June 14th.    Baseline --   Time 4   Period Weeks   Status Revised   PT SHORT TERM GOAL #5   Title Demonstrate ability to ambulate 300' on level surfaces independently for decreased fall risk. Target date June 14th.    Time 4   Period Weeks   Status --           PT Long Term Goals - 11/24/14 1539    PT LONG TERM GOAL #1   Title pt will verbalize understanding of fall prevention strategies in home environment. Target Date July 5th.    Time 8   Period Weeks   Status New   PT LONG TERM GOAL #2   Title pt will report no falls over the last 4 weeks to demonstrate improved safety with functional mobility. Target date July 5th.    Time 8   Period Weeks   Status New   PT LONG TERM GOAL #3   Title Demonstrate ability to ambulate 500' on level surfaces independently for decreased fall risk. Target date July 5th.    Time 8   Period Weeks   Status New               Plan - 12/10/14 1713    Clinical Impression Statement In addition limited trunk mobility during gait, pt demonstrates delayed hip strategy with anterior LOB and decreased quadriceps control when descending stairs. Pt  will continue to benefit from skilled PT to increase stability with functional mobility.   Rehab Potential Good   PT Frequency 2x / week   PT Duration 8 weeks   PT Treatment/Interventions ADLs/Self Care Home Management;Therapeutic activities;Patient/family education;Visual/perceptual remediation/compensation;DME Instruction;Therapeutic exercise;Biofeedback;Gait training;Balance training;Electrical Stimulation;Functional mobility training;Neuromuscular re-education;Stair training   PT Next Visit Plan Quadriceps control, increase trunk/pelvic mobility, balance recovery reactions, dynamic gait, coordination activities.   Consulted and Agree with Plan of Care Patient        Problem List Patient Active Problem List   Diagnosis Date Noted  . IBS (irritable bowel syndrome) 12/01/2014  .  Vertical diplopia 11/17/2014  . Non-melanoma skin cancer 05/13/2014  . Uncontrolled type 2 diabetes mellitus with nephropathy 04/22/2014  . Seasonal allergies 09/12/2011  . Anxiety 09/12/2011  . HTN (hypertension) 09/12/2011  . Breast cancer 09/12/2011  . GERD (gastroesophageal reflux disease) 09/12/2011  . Dyslipidemia 09/12/2011  . Osteopenia 09/12/2011  . DJD (degenerative joint disease) 09/12/2011  . Factor 5 Leiden mutation, heterozygous 09/12/2011  . Personal history of colonic polyps 09/12/2011   Billie Ruddy, PT, DPT [facility] [address] [place], [place], [address] Phone: [phone]   Fax:  [phone] 12/10/2014, 5:26 PM

## 2014-12-14 ENCOUNTER — Ambulatory Visit (INDEPENDENT_AMBULATORY_CARE_PROVIDER_SITE_OTHER): Payer: Medicare Other | Admitting: Internal Medicine

## 2014-12-14 ENCOUNTER — Encounter: Payer: Self-pay | Admitting: Internal Medicine

## 2014-12-14 VITALS — BP 118/62 | HR 82 | Temp 98.5°F | Resp 14 | Wt 175.0 lb

## 2014-12-14 DIAGNOSIS — E1165 Type 2 diabetes mellitus with hyperglycemia: Secondary | ICD-10-CM

## 2014-12-14 DIAGNOSIS — IMO0002 Reserved for concepts with insufficient information to code with codable children: Secondary | ICD-10-CM

## 2014-12-14 DIAGNOSIS — E1129 Type 2 diabetes mellitus with other diabetic kidney complication: Secondary | ICD-10-CM | POA: Diagnosis not present

## 2014-12-14 DIAGNOSIS — E1121 Type 2 diabetes mellitus with diabetic nephropathy: Secondary | ICD-10-CM

## 2014-12-14 MED ORDER — GLIPIZIDE ER 5 MG PO TB24: 5.0000 mg | ORAL_TABLET | Freq: Every day | ORAL | Status: DC

## 2014-12-14 NOTE — Progress Notes (Signed)
Patient ID: [name] [name], female   DOB: 05/24/1941, 74 y.o.   MRN: [id number]  HPI: [name] [name] is a 74 y.o.-year-old female, returning for follow-up for DM2, dx 2014, non-insulin-dependent, uncontrolled, with complications (mild CKD). Last visit 5 month ago.  She had an episode of diplopia >> ED in 10/2014. Now resolved.   Last hemoglobin A1c was: Lab Results  Component Value Date   HGBA1C 6.1* 10/15/2014   HGBA1C 6.8* 07/15/2014   HGBA1C 9.8* 04/02/2014   Pt is on a regimen of: - Invokana 100 mg in am - started 04/2014 - metformin 500 mg tid - started 04/2014  - Glipizide 5 mg 2x a day, before breakfast and dinner She stopped Glipizide XL 5 mg in am when started Invokana.  We gave pt a glucometer at last visit and she started to check her sugars 1x a day >> all sugars 63-137. Highest sugar 153 (iafter spaghetti). Lowest 64.  Reviewed sugars from last visit.  - A.m.: 295-284 (later higher after reducing metformin) >> 107-154  - 2 hours post breakfast: 264 >> n/c - Before lunch: n/c >> 95-144 - 2 hours post lunch: n/c >> 85-111 - before dinner: n/c >> 82-125 - 2 hours post dinner: n/c  - Bedtime: n/c >> 84-121  Pt's meals are: - Breakfast: apple + cottage cheese + whole wheat - Lunch: leftovers from dinner; sandwich; salad - Dinner: varies; some fast food - Snacks: veggies; chips Some diet sodas.  [facility], Tai Chi 1x a week.  - + CKD, last BUN/creatinine:  Lab Results  Component Value Date   BUN 33* 11/03/2014   CREATININE 1.31* 11/03/2014  Not on an ACEI. - last set of lipids: Lab Results  Component Value Date   CHOL 183 01/01/2014   HDL 37* 01/01/2014   LDLCALC NOT CALC 01/01/2014   TRIG 401* 01/01/2014   CHOLHDL 4.9 01/01/2014  Not on a statin. - last eye exam was in 02/2014. No DR. She had cataract sx.  - + numbness and tingling in her feet. She tells me she had PN long before she was dx with DM. She used  Lidocaine patches >> discontinued. We started PN cream , but this was expensive (93$) and did not work much >> Voltaren gel works a little better.  I reviewed pt's medications, allergies, PMH, social hx, family hx, and changes were documented in the history of present illness. Otherwise, unchanged from my initial visit note.  ROS: Constitutional: no weight gain/loss, no fatigue, no subjective hyperthermia/hypothermia Eyes: no blurry vision, no xerophthalmia ENT: no sore throat, no nodules palpated in throat, no dysphagia/odynophagia, no hoarseness Cardiovascular: no CP/SOB/palpitations/leg swelling Respiratory: no cough/SOB Gastrointestinal: no N/ V/D/C/+ heartburn Musculoskeletal: no muscle/joint aches Skin: no rashes, no hair loss Neurological: no tremors/numbness/itching  PE: BP 118/62 mmHg  Pulse 82  Temp(Src) 98.5 F (36.9 C) (Oral)  Resp 14  Wt 175 lb (79.379 kg)  SpO2 98% Body mass index is 29.59 kg/(m^2). Wt Readings from Last 3 Encounters:  12/14/14 175 lb (79.379 kg)  11/17/14 175 lb 3.2 oz (79.47 kg)  11/03/14 170 lb (77.111 kg)   Constitutional: overweight, in NAD Eyes: PERRLA, EOMI, no exophthalmos ENT: moist mucous membranes, no thyromegaly, no cervical lymphadenopathy Cardiovascular: RRR, No MRG Respiratory: CTA B Gastrointestinal: abdomen soft, NT, ND, BS+ Musculoskeletal: no deformities, strength intact in all 4 Skin: moist, warm, no rashes Neurological: + tremor with outstretched hands, DTR  normal in all 4  ASSESSMENT: 1. DM2, non-insulin-dependent, uncontrolled, with complications - mild CKD  PLAN:  1. Patient with uncontrolled diabetes, on oral antidiabetic regimen with Invokana, Metformin, and Glipizide  >> sugars greatly improved! - per log and per last HbA1c. She needs less Glipizide as she is now getting lows. Patient Instructions   Continue: - Invokana 100 mg in am - metformin 500 mg 3x a day with meals  Stop: - Glipizide 5 mg 2x a  day  Start: - Glipizide XL 5 mg daily in am  Please return in 3 months with your sugar log.  - she prefers to have labs checked by PCP - continue checking sugars at different times of the day - check 1-2 times a day, rotating checks - advised for yearly eye exams - she is up to date - Return to clinic in 3 mo with sugar log

## 2014-12-14 NOTE — Patient Instructions (Signed)
Patient Instructions   Continue: - Invokana 100 mg in am - metformin 500 mg 3x a day with meals  Stop: - Glipizide 5 mg 2x a day  Start: - Glipizide XL 5 mg daily in am  Please return in 3 months with your sugar log.

## 2014-12-15 ENCOUNTER — Ambulatory Visit: Payer: Medicare Other | Admitting: Physical Therapy

## 2014-12-15 DIAGNOSIS — Z9181 History of falling: Secondary | ICD-10-CM

## 2014-12-15 DIAGNOSIS — R269 Unspecified abnormalities of gait and mobility: Secondary | ICD-10-CM

## 2014-12-15 NOTE — Therapy (Signed)
[place] [address] [place], [place], [address] Phone: [phone]   Fax:  [phone]  Physical Therapy Treatment  Patient Details  Name: [name] [name] MRN: [id number] Date of Birth: 10-26-1940 Referring Provider:  Robyn Haber, MD  Encounter Date: 12/15/2014      PT End of Session - 12/15/14 1558    Visit Number 6   Number of Visits 16   Date for PT Re-Evaluation 01/24/15   PT Start Time 1157   PT Stop Time 1533   PT Time Calculation (min) 45 min   Equipment Utilized During Treatment Gait belt   Activity Tolerance Patient tolerated treatment well   Behavior During Therapy [facility] for tasks assessed/performed      Past Medical History  Diagnosis Date  . Hypertension   . Arthritis   . IBS (irritable bowel syndrome)   . Allergy   . Clotting disorder   . Diabetes mellitus   . Cancer     breast and skin    Past Surgical History  Procedure Laterality Date  . Mastectomy  2002    Bilateral  . Breast lumpectomy  1996  . Abdominal hysterectomy  1985    There were no vitals filed for this visit.  Visit Diagnosis:  Abnormality of gait  Risk for falls      Subjective Assessment - 12/15/14 1554    Subjective Pt reports no recent falls. When asked about HEP compliance, pt stated, "some" and described home exercises as "pointless" and "stupid" during this session.   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 No/denies            [facility] PT Assessment - 12/15/14 0001    Dynamic Gait Index   Level Surface Mild Impairment   Change in Gait Speed Normal   Gait with Horizontal Head Turns Mild Impairment   Gait with Vertical Head Turns Mild Impairment   Gait and Pivot Turn Normal   Step Over Obstacle Mild Impairment   Step Around Obstacles Normal   Steps Mild Impairment   Total Score 19          Treatment: Therapeutic Exercises performed to assess/address HEP performance: - Standing: B gastrocnemius stretch 3 x30-second holds per side with use of paper handout and max verbal/demonstration cueing for technique.  Neuro Re-ed: - Completed Dynamic Gait Index with score of 19/24. See above for detailed findings.  Pt completed the following home exercises with use of paper handout , max verbal/demonstration cueing for technique, and supervision for safety: - VOR x1 viewing with horizontal, vertical, and diagonal head turns; manual stabilization required to prevent movement of entire trunk (as opposed to head only). - Standing in corner with chair in front of pt for safety, pt performed static standing with narrow BOS with eyes closed, pt performed horizontal, vertical, and diagonal head turns. - B Sidestepping 10' x4 reps per direction. - Tandem walking 10' x4 reps with finger tip support   Due to pt-perception of gaze stabilization exercises being "pointless" and "stupid", performed brief vestibular assessment, which revealed the following:  Oculomotor: decreased smoothness of horizontal visual tracking; R gaze-evoked nystagmus >10 beats at endrange. Difficult to discern impaired slow VOR from impaired sustained attention. (+) L Head Thrust Test. Pt denied symptoms of dizziness/disequilibrium during/after assessment. Utilized information to educate pt on relationship between impairments and home exercises; see Patient Instruction section  for details.         PT Education - 12/15/14 1556    Education provided Yes   Education Details Explanation of assessment findings, relationship between impairments and HEP; recommendation for HEP compliance to maximize functional gains.   Person(s) Educated Patient   Methods Explanation;Demonstration;Handout   Comprehension Verbalized understanding          PT Short Term Goals - 12/15/14 1454    PT SHORT TERM GOAL #1   Title The  patient will improve gait speed from 2.18 ft/sec up to 3 ft/sec to demo transition to "full community ambulator" (2.62 ft/sec is cut-off)  classification of gait. Target Date June 14th.    Time 4   Period Weeks   Status On-going   PT SHORT TERM GOAL #2   Title Pt will be independent with HEP to address balance, flexibility, and gait. Target Date June 14th.   Time 4   Period Weeks   Status Not Met  Pt required paper handout and max verbal/demonstration cueing for proper HEP performance on 12/15/14   PT SHORT TERM GOAL #3   Title Improve DGI from 15/24 up to 19/24 to demo decreasing risk for falls.  Target date 12/22/2014   Time 4   Period Weeks   Status Achieved  19/24 on 12/15/14   PT SHORT TERM GOAL #4   Title Pt will negotiate stairs with single hand rail modified indep with reciprocal gait pattern on steps.  Target Date June 14th.    Time 4   Period Weeks   Status On-going   PT SHORT TERM GOAL #5   Title Demonstrate ability to ambulate 300' on level surfaces independently for decreased fall risk. Target date June 14th.    Time 4   Period Weeks   Status On-going           PT Long Term Goals - 11/24/14 1539    PT LONG TERM GOAL #1   Title pt will verbalize understanding of fall prevention strategies in home environment. Target Date July 5th.    Time 8   Period Weeks   Status New   PT LONG TERM GOAL #2   Title pt will report no falls over the last 4 weeks to demonstrate improved safety with functional mobility. Target date July 5th.    Time 8   Period Weeks   Status New   PT LONG TERM GOAL #3   Title Demonstrate ability to ambulate 500' on level surfaces independently for decreased fall risk. Target date July 5th.    Time 8   Period Weeks   Status New               Plan - 12/15/14 1600    Clinical Impression Statement Despite inconsistent HEP compliance, pt does exhibit improvement in dynamic gait, as demonstrated by improved DGI score from 14/24 to 19/24.  Continue per POC.   PT Next Visit Plan Assess remaining STG's and pt compliance with HEP. Plan on taking 2-week hiatus from PT if pt feels no functional progress.   Consulted and Agree with Plan of Care Patient        Problem List Patient Active Problem List   Diagnosis Date Noted  . IBS (irritable bowel syndrome) 12/01/2014  . Vertical diplopia 11/17/2014  . Non-melanoma skin cancer 05/13/2014  . Uncontrolled type 2 diabetes mellitus with nephropathy 04/22/2014  . Seasonal allergies 09/12/2011  . Anxiety 09/12/2011  . HTN (hypertension) 09/12/2011  . Breast cancer  09/12/2011  . GERD (gastroesophageal reflux disease) 09/12/2011  . Dyslipidemia 09/12/2011  . Osteopenia 09/12/2011  . DJD (degenerative joint disease) 09/12/2011  . Factor 5 Leiden mutation, heterozygous 09/12/2011  . Personal history of colonic polyps 09/12/2011    Billie Ruddy, PT, DPT [facility] [address] [place], [place], [address] Phone: [phone]   Fax:  [phone] 12/15/2014, 4:04 PM

## 2014-12-16 ENCOUNTER — Encounter: Payer: Medicare Other | Admitting: Rehabilitative and Restorative Service Providers"

## 2014-12-16 ENCOUNTER — Ambulatory Visit: Payer: Medicare Other | Admitting: Neurology

## 2014-12-17 ENCOUNTER — Ambulatory Visit: Payer: Medicare Other | Admitting: Physical Therapy

## 2014-12-17 ENCOUNTER — Encounter: Payer: Self-pay | Admitting: Family Medicine

## 2014-12-17 DIAGNOSIS — R269 Unspecified abnormalities of gait and mobility: Secondary | ICD-10-CM

## 2014-12-17 DIAGNOSIS — R531 Weakness: Secondary | ICD-10-CM

## 2014-12-17 DIAGNOSIS — Z9181 History of falling: Secondary | ICD-10-CM

## 2014-12-17 NOTE — Patient Instructions (Signed)
  Copyright  VHI. All rights reserved.  Knee Extension: Step-Down Forward / Sideways / Backward (Eccentric)   Stand at bottom of stairs with hand on rail. Place right foot over edge of stair (as first picture illustrates). Bend left knee to dip heel downward (as though you're going down the stairs). Do this 10 times. Switch to other leg.  Perform this exercise 3 times on each leg (total of 30 reps) every day.   Copyright  VHI. All rights reserved.

## 2014-12-17 NOTE — Therapy (Signed)
[facility] [address] [place], [place], [address] Phone: [phone]   Fax:  [phone]  Physical Therapy Treatment  Patient Details  Name: [name] [name] MRN: [id number] Date of Birth: October 09, 1940 Referring Provider:  Robyn Haber, MD  Encounter Date: 12/17/2014      PT End of Session - 12/17/14 1156    Visit Number 6   Number of Visits 16   Date for PT Re-Evaluation 01/24/15   PT Start Time 1106   PT Stop Time 1149   PT Time Calculation (min) 43 min   Equipment Utilized During Treatment Gait belt   Activity Tolerance Patient tolerated treatment well   Behavior During Therapy Flat affect      Past Medical History  Diagnosis Date  . Hypertension   . Arthritis   . IBS (irritable bowel syndrome)   . Allergy   . Clotting disorder   . Diabetes mellitus   . Cancer     breast and skin    Past Surgical History  Procedure Laterality Date  . Mastectomy  2002    Bilateral  . Breast lumpectomy  1996  . Abdominal hysterectomy  1985    There were no vitals filed for this visit.  Visit Diagnosis:  Abnormality of gait  Risk for falls  Weakness generalized      Subjective Assessment - 12/17/14 1108    Subjective No falls to report. 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 No/denies        Treatment Neuro Re-ed: - To assess dynamic visual acuity with head turns, performed Dynamic Visual Acuity Test. Static visual acuity: 20/20. Dynamic Visual Acuity: 20/50 (difference: 4 lines). - Standing on large rocker board (oriented for A/P instability) at parallel bars with intermittent finger tip support,  pt performed horizontal head turning to locate visual target. Progressed from standing with feet shoulder width apart to R semi tandem. CGA to Min A required for stability/balance secondary to decreased postural stability with horizontal head turning. - B side stepping at countertop without UE support 4 x10' with mod I, no overt LOB. Removed from HEP.   Self-Care: - Explained to pt that a 4-line difference between static and dynamic visual acuity is significant, that this impairment may be attributing to decreased stability with head turns . See Patient Education section.  Therapeutic Exercises: - B heel dips (forward only) x10 reps per side (to pt fatigue). for increased eccentric quadriceps on 6" step with single rail. Added to HEP.        PT Education - 12/17/14 1154    Education provided Yes   Education Details HEP: added heel dips; omitted side stepping. Explained progress wth STG's, relationship  between gaze stabilization exercises and decreased gait stability with head turns.   Person(s) Educated Patient   Methods Explanation;Demonstration;Handout   Comprehension Verbalized understanding;Returned demonstration          PT Short Term Goals - 12/17/14 1111    PT SHORT TERM GOAL #1   Title The patient will improve gait speed from 2.18 ft/sec up to 3 ft/sec to demo transition to "full community ambulator" (2.62 ft/sec is cut-off)  classification of gait. Target  Date June 14th.    Time 4   Period Weeks   Status Not Met  2.69 ft/sec on 12/17/14   PT SHORT TERM GOAL #2   Title Pt will be independent with HEP to address balance, flexibility, and gait. Target Date June 14th.   Time 4   Period Weeks   Status Not Met  Pt required paper handout and max verbal/demonstration cueing for proper HEP performance on 12/15/14   PT SHORT TERM GOAL #3   Title Improve DGI from 15/24 up to 19/24 to demo decreasing risk for falls.  Target date 12/22/2014   Time 4   Period Weeks   Status Achieved   19/24 on 12/15/14   PT SHORT TERM GOAL #4   Title Pt will negotiate stairs with single hand rail modified indep with reciprocal gait pattern on steps.  Target Date June 14th.    Time 4   Period Weeks   Status Partially Met  Ascended with 4 stairs with 1 rail and mod I, descended with supervision due to single L toe catch   PT SHORT TERM GOAL #5   Title Demonstrate ability to ambulate 300' on level surfaces independently for decreased fall risk. Target date June 14th.    Time 4   Period Weeks   Status Not Met  Pt ambulated x345' without AD but mod I secondary to gait velocity.           PT Long Term Goals - 11/24/14 1539    PT LONG TERM GOAL #1   Title pt will verbalize understanding of fall prevention strategies in home environment. Target Date July 5th.    Time 8   Period Weeks   Status New   PT LONG TERM GOAL #2   Title pt will report no falls over the last 4 weeks to demonstrate improved safety with functional mobility. Target date July 5th.    Time 8   Period Weeks   Status New   PT LONG TERM GOAL #3   Title Demonstrate ability to ambulate 500' on level surfaces independently for decreased fall risk. Target date July 5th.    Time 8   Period Weeks   Status New     Problem List Patient Active Problem List   Diagnosis Date Noted  . IBS (irritable bowel syndrome) 12/01/2014  . Vertical diplopia 11/17/2014  . Non-melanoma skin cancer 05/13/2014  . Uncontrolled type 2 diabetes mellitus with nephropathy 04/22/2014  . Seasonal allergies 09/12/2011  . Anxiety 09/12/2011  . HTN (hypertension) 09/12/2011  . Breast cancer 09/12/2011  . GERD (gastroesophageal reflux disease) 09/12/2011  . Dyslipidemia 09/12/2011  . Osteopenia 09/12/2011  . DJD (degenerative joint disease) 09/12/2011  . Factor 5 Leiden mutation, heterozygous 09/12/2011  . Personal history of colonic polyps 09/12/2011   Billie Ruddy, PT, DPT [facility] [address]  [place], [place], [address] Phone: [phone]   Fax:  [phone] 12/17/2014, 12:00 PM

## 2014-12-18 ENCOUNTER — Encounter: Payer: Medicare Other | Admitting: Rehabilitative and Restorative Service Providers"

## 2014-12-22 ENCOUNTER — Ambulatory Visit: Payer: Medicare Other

## 2014-12-22 ENCOUNTER — Telehealth: Payer: Self-pay

## 2014-12-22 DIAGNOSIS — R269 Unspecified abnormalities of gait and mobility: Secondary | ICD-10-CM

## 2014-12-22 DIAGNOSIS — Z9181 History of falling: Secondary | ICD-10-CM

## 2014-12-22 NOTE — Telephone Encounter (Signed)
Patient called back and states that [facility] needs another letter from [place] stating why she needs Xifaxan for longer than two weeks.   [phone]

## 2014-12-22 NOTE — Telephone Encounter (Signed)
Appeal was approved but letter states until June 22,2016. Surely this is a typo? I spoke to pt who had received the same letter and she may go ahead and call ins appeal department to make sure that the med is approved for a year from now as is the usual. Case # is [id number], Authorization # [id number]. Their phone number if we need to call back is [phone].

## 2014-12-22 NOTE — Patient Instructions (Signed)

## 2014-12-22 NOTE — Therapy (Addendum)
[facility] [address] [place], [place], [address] Phone: [phone]   Fax:  [phone]  Physical Therapy Treatment  Patient Details  Name: [name] [name] MRN: [id number] Date of Birth: 09/13/1940 Referring Provider:  Robyn Haber, MD  Encounter Date: 12/22/2014      PT End of Session - 12/22/14 2009    Visit Number 7   Number of Visits 16   Date for PT Re-Evaluation 01/24/15   Authorization Type Medicare - G Codes every 10 sessions   PT Start Time 6962   PT Stop Time 1359   PT Time Calculation (min) 42 min   Equipment Utilized During Treatment Gait belt   Activity Tolerance Patient tolerated treatment well   Behavior During Therapy [facility] for tasks assessed/performed      Past Medical History  Diagnosis Date  . Hypertension   . Arthritis   . IBS (irritable bowel syndrome)   . Allergy   . Clotting disorder   . Diabetes mellitus   . Cancer     breast and skin    Past Surgical History  Procedure Laterality Date  . Mastectomy  2002    Bilateral  . Breast lumpectomy  1996  . Abdominal hysterectomy  1985    There were no vitals filed for this visit.  Visit Diagnosis:  Risk for falls  Abnormality of gait      Subjective Assessment - 12/22/14 1320    Subjective Pt denied falls or changes since last visit.   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 No/denies                         [facility] Adult PT Treatment/Exercise - 12/22/14 1328    Ambulation/Gait   Ambulation/Gait Yes   Ambulation/Gait Assistance 4: Min guard;5: Supervision   Ambulation/Gait Assistance Details Pt noted to experience increased anterior weight shifting and momentum when ambulating in grass and downhill. Cues to improve weight  shifting, decrease forward momentum, and to improve B heel strike.   Ambulation Distance (Feet) --  400' outdoors, 150' indoors   Assistive device None   Gait Pattern Decreased step length - right;Decreased step length - left;Right flexed knee in stance;Left flexed knee in stance;Decreased trunk rotation;Wide base of support;Poor foot clearance - left;Poor foot clearance - right   Ambulation Surface Level;Unlevel;Indoor;Paved;Gravel;Grass;Other (comment)  rubber mulch   Ramp Other (comment)  min guard   Gait Comments x4 to ascend/descend ramp. VC's and demonstration to improve weight shifting in ant. direction while ascending ramp and to maintain upright posture to descend ramp, in order to improve control while descending ramp.   Balance   Balance Assessed Yes   Dynamic Standing Balance   Dynamic Standing - Balance Support Bilateral upper extremity supported;No upper extremity supported   Dynamic Standing - Level of Assistance 4: Min assist;Other (comment)  min guard   Dynamic Standing - Balance Activities Lateral lean/weight shifting;Forward lean/weight shifting;Rocker board;Eyes closed;Eyes open  small and large rockerboard   Dynamic Standing - Comments Rockerboard in // bars: head turns, eyes open/closed, ant/post/lat. weight shifting. Cues for technique.   High Level Balance   High Level Balance Activities Side stepping;Other (comment);Tandem walking;Head turns  on compliant surface (blue mat).   High Level Balance Comments at counter with pt  progressing from 1 UE support to no UE support, all performed with min guard to min A (during 2 LOB episodes). Forward amb with head turns, sidestepping, and tandem stance/walking. Cues for technique, improve foot clearance during sidestepping, and to reduce trunk movement when performing head turns.                PT Education - 12/22/14 2008    Education provided Yes   Education Details PT reiterated the importance of HEP and to notify  PT if exercises become too easy and then HEP can be progressed. Pt still feels that gaze stabiliation is not applicable to her. Fall prevention handout.   Person(s) Educated Patient   Methods Explanation   Comprehension Need further instruction;Verbalized understanding  need further instruction for gaze HEP importance.          PT Short Term Goals - 12/17/14 1111    PT SHORT TERM GOAL #1   Title The patient will improve gait speed from 2.18 ft/sec up to 3 ft/sec to demo transition to "full community ambulator" (2.62 ft/sec is cut-off)  classification of gait. Target Date June 14th.    Time 4   Period Weeks   Status Not Met  2.69 ft/sec on 12/17/14   PT SHORT TERM GOAL #2   Title Pt will be independent with HEP to address balance, flexibility, and gait. Target Date June 14th.   Time 4   Period Weeks   Status Achieved  Met 12/17/14   PT SHORT TERM GOAL #3   Title Improve DGI from 15/24 up to 19/24 to demo decreasing risk for falls.  Target date 12/22/2014   Time 4   Period Weeks   Status Achieved  19/24 on 12/15/14   PT SHORT TERM GOAL #4   Title Pt will negotiate stairs with single hand rail modified indep with reciprocal gait pattern on steps.  Target Date June 14th.    Time 4   Period Weeks   Status Partially Met  Ascended with 4 stairs with 1 rail and mod I, descended with supervision due to single L toe catch   PT SHORT TERM GOAL #5   Title Demonstrate ability to ambulate 300' on level surfaces independently for decreased fall risk. Target date June 14th.    Time 4   Period Weeks   Status Not Met  Pt ambulated x345' without AD but mod I secondary to gait velocity.           PT Long Term Goals - 12/17/14 2219    PT LONG TERM GOAL #1   Title pt will verbalize understanding of fall prevention strategies in home environment. Target Date July 5th.    Time 8   Period Weeks   Status On-going   PT LONG TERM GOAL #2   Title pt will report no falls over the last 4 weeks to  demonstrate improved safety with functional mobility. Target date July 5th.    Time 8   Period Weeks   Status On-going   PT LONG TERM GOAL #3   Title Demonstrate ability to ambulate 500' on level surfaces independently for decreased fall risk. Target date July 5th.    Time 8   Period Weeks   Status On-going               Plan - 12/22/14 2010    Clinical Impression Statement Pt demonstrated improve participation in PT today, as she was eager to explain activities that were difficult  for pt to perform. Pt continues to experience increased anterior weight shifting while traversing downhill slopes and increased momentum, which makes it diffcult for pt to control descent. Continue with POC.   Pt will benefit from skilled therapeutic intervention in order to improve on the following deficits Abnormal gait;Decreased coordination;Difficulty walking;Impaired flexibility;Postural dysfunction;Decreased balance;Decreased knowledge of use of DME;Decreased mobility;Impaired perceived functional ability   Rehab Potential Good   Clinical Impairments Affecting Rehab Potential pt reports her opthamologist told her she may have had recent TIA   PT Frequency 2x / week   PT Duration 8 weeks   PT Treatment/Interventions ADLs/Self Care Home Management;Therapeutic activities;Patient/family education;Visual/perceptual remediation/compensation;DME Instruction;Therapeutic exercise;Biofeedback;Gait training;Balance training;Electrical Stimulation;Functional mobility training;Neuromuscular re-education;Stair training   PT Next Visit Plan Review gaze HEP and progress as tolerated. Dynamic standing balance on varying surfaces with head turns; single limb stance. Traverse ramp and perform exercises on ramp.   Consulted and Agree with Plan of Care Patient        Problem List Patient Active Problem List   Diagnosis Date Noted  . IBS (irritable bowel syndrome) 12/01/2014  . Vertical diplopia 11/17/2014  .  Non-melanoma skin cancer 05/13/2014  . Uncontrolled type 2 diabetes mellitus with nephropathy 04/22/2014  . Seasonal allergies 09/12/2011  . Anxiety 09/12/2011  . HTN (hypertension) 09/12/2011  . Breast cancer 09/12/2011  . GERD (gastroesophageal reflux disease) 09/12/2011  . Dyslipidemia 09/12/2011  . Osteopenia 09/12/2011  . DJD (degenerative joint disease) 09/12/2011  . Factor 5 Leiden mutation, heterozygous 09/12/2011  . Personal history of colonic polyps 09/12/2011    Dasean Brow L 12/22/2014, 8:15 PM  [facility] [address] [place], [place], [address] Phone: [phone]   Fax:  [phone]     Geoffry Paradise, PT,DPT 12/22/2014 8:15 PM Phone: [phone] Fax: [phone]

## 2014-12-24 ENCOUNTER — Ambulatory Visit: Payer: Medicare Other | Admitting: Physical Therapy

## 2014-12-24 DIAGNOSIS — R269 Unspecified abnormalities of gait and mobility: Secondary | ICD-10-CM | POA: Diagnosis not present

## 2014-12-24 DIAGNOSIS — Z9181 History of falling: Secondary | ICD-10-CM

## 2014-12-24 DIAGNOSIS — R531 Weakness: Secondary | ICD-10-CM

## 2014-12-24 NOTE — Therapy (Signed)
[facility] [address] [place], [place], [address] Phone: [phone]   Fax:  [phone]  Physical Therapy Treatment  Patient Details  Name: [name] [name] MRN: [id number] Date of Birth: 06/28/1941 Referring Provider:  Robyn Haber, MD  Encounter Date: 12/24/2014      PT End of Session - 12/24/14 1136    Visit Number 9  mistakenly entered 6th visit for 2 visits (6/7 and 6/9)   Number of Visits 16   Date for PT Re-Evaluation 01/24/15   Authorization Type Medicare - G Codes every 10 sessions   PT Start Time 1031  Pt arrived late for this visit   PT Stop Time 1100   PT Time Calculation (min) 29 min   Equipment Utilized During Treatment Gait belt   Activity Tolerance Patient tolerated treatment well   Behavior During Therapy [facility] for tasks assessed/performed      Past Medical History  Diagnosis Date  . Hypertension   . Arthritis   . IBS (irritable bowel syndrome)   . Allergy   . Clotting disorder   . Diabetes mellitus   . Cancer     breast and skin    Past Surgical History  Procedure Laterality Date  . Mastectomy  2002    Bilateral  . Breast lumpectomy  1996  . Abdominal hysterectomy  1985    There were no vitals filed for this visit.  Visit Diagnosis:  Abnormality of gait  Risk for falls  Weakness generalized      Subjective Assessment - 12/24/14 1114    Subjective No falls to report. No reported changes.    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 No/denies        Treatment Gait Training: Pt ambulated on treadmill x6 minutes with intermittent RUE support and min guard to min A. Pt exhibited difficulty ambulating at speed >0.4. Utilized Chemical engineer for visual feedback to increase stride length;  also provided verbal cueing for increased LLE step length. During gait, pt performed Stroop exercise (cognitive task) for dual tasking during gait. With dual tasking, stride length shortened (step length on L < R). Average step length: 10 cm on L; 19 cm on R.  Neuro Re-ed: - Standing on ramp, pt performed the following standing on decline then decline under each of the following conditions: R semi tandem, L semi tandem, and narrow BOS without UE support. Pt required Min guard to Min A (due to occasional anterior LOB when on declined surface) for standing x30 consecutive seconds x2 trials under each condition. Pt demonstrated most postural instability with L semi tandem on declined surface. - While standing on compliant surface (pillow) in corner, instructed pt to visually fixate on tennis ball with eyes only (X1 viewing) as pt tossed ball up/down x20 reps; from R to L hand x20 reps. Transitioned to bouncing ball on ground then catching it x10 reps, x5 reps for vertical head turns. Pt required min guard to min A to recover from single posterior LOB with no hip strategy noted. Activity focused on dual tasking, gaze stabilization, and postural stability on compliant surfaces.          [facility] Adult PT Treatment/Exercise - 12/24/14 0001    Ambulation/Gait   Ambulation/Gait Yes   Ambulation/Gait Assistance 5: Supervision;4: Min guard;4: Min assist  Ambulation/Gait Assistance Details Min A during gait training on treadmill   Assistive device None   Gait Pattern Decreased step length - left;Decreased stride length;Decreased weight shift to right;Decreased weight shift to left;Decreased trunk rotation  Limited B arm swing (more limited during dual tasking)   Ambulation Surface Level;Indoor   Ramp 5: Supervision;4: Min assist   Ramp Details (indicate cue type and reason) supervision to ascend; min guard to descend           PT Education - 12/24/14 1117    Education provided Yes   Education Details  Rationale for interventions with focus on function, patient-stated goals. Explained, demonstrated lateral technique for stair negotiation to utilize when pt feeling off-balance.   Person(s) Educated Patient   Methods Explanation;Demonstration   Comprehension Verbalized understanding;Need further instruction;Returned demonstration          PT Short Term Goals - 12/17/14 1111    PT SHORT TERM GOAL #1   Title The patient will improve gait speed from 2.18 ft/sec up to 3 ft/sec to demo transition to "full community ambulator" (2.62 ft/sec is cut-off)  classification of gait. Target Date June 14th.    Time 4   Period Weeks   Status Not Met  2.69 ft/sec on 12/17/14   PT SHORT TERM GOAL #2   Title Pt will be independent with HEP to address balance, flexibility, and gait. Target Date June 14th.   Time 4   Period Weeks   Status Achieved  Met 12/17/14   PT SHORT TERM GOAL #3   Title Improve DGI from 15/24 up to 19/24 to demo decreasing risk for falls.  Target date 12/22/2014   Time 4   Period Weeks   Status Achieved  19/24 on 12/15/14   PT SHORT TERM GOAL #4   Title Pt will negotiate stairs with single hand rail modified indep with reciprocal gait pattern on steps.  Target Date June 14th.    Time 4   Period Weeks   Status Partially Met  Ascended with 4 stairs with 1 rail and mod I, descended with supervision due to single L toe catch   PT SHORT TERM GOAL #5   Title Demonstrate ability to ambulate 300' on level surfaces independently for decreased fall risk. Target date June 14th.    Time 4   Period Weeks   Status Not Met  Pt ambulated x345' without AD but mod I secondary to gait velocity.           PT Long Term Goals - 12/17/14 2219    PT LONG TERM GOAL #1   Title pt will verbalize understanding of fall prevention strategies in home environment. Target Date July 5th.    Time 8   Period Weeks   Status On-going   PT LONG TERM GOAL #2   Title pt will report no falls over the last 4  weeks to demonstrate improved safety with functional mobility. Target date July 5th.    Time 8   Period Weeks   Status On-going   PT LONG TERM GOAL #3   Title Demonstrate ability to ambulate 500' on level surfaces independently for decreased fall risk. Target date July 5th.    Time 8   Period Weeks   Status On-going           Plan - 12/24/14 1141    Clinical Impression Statement Interventions focused on increasing postural/gait stability with dual tasking, gaze stabilization, and on ramp. Noted decreased gait stability (more  limited step length, arm swing, trunk rotation) when cognitive task added. With posterior LOB, noted little to no presence of hip strategy. Pt does demonstrate improved awareness of functional impairments. Continue per POC.   Pt will benefit from skilled therapeutic intervention in order to improve on the following deficits Abnormal gait;Decreased coordination;Difficulty walking;Impaired flexibility;Postural dysfunction;Decreased balance;Decreased knowledge of use of DME;Decreased mobility;Impaired perceived functional ability   Rehab Potential Good   Clinical Impairments Affecting Rehab Potential pt reports her opthamologist told her she may have had recent TIA   PT Frequency 2x / week   PT Duration 8 weeks   PT Next Visit Plan G Codes and Progress Note. Balance recovery reactions. Controlling anterior weight shift when descending stairs, ramp.   Consulted and Agree with Plan of Care Patient        Problem List Patient Active Problem List   Diagnosis Date Noted  . IBS (irritable bowel syndrome) 12/01/2014  . Vertical diplopia 11/17/2014  . Non-melanoma skin cancer 05/13/2014  . Uncontrolled type 2 diabetes mellitus with nephropathy 04/22/2014  . Seasonal allergies 09/12/2011  . Anxiety 09/12/2011  . HTN (hypertension) 09/12/2011  . Breast cancer 09/12/2011  . GERD (gastroesophageal reflux disease) 09/12/2011  . Dyslipidemia 09/12/2011  . Osteopenia  09/12/2011  . DJD (degenerative joint disease) 09/12/2011  . Factor 5 Leiden mutation, heterozygous 09/12/2011  . Personal history of colonic polyps 09/12/2011   Billie Ruddy, PT, DPT [facility] [address] [place], [place], [address] Phone: [phone]   Fax:  [phone] 12/24/2014, 12:32 PM

## 2014-12-29 ENCOUNTER — Telehealth: Payer: Self-pay

## 2014-12-29 NOTE — Telephone Encounter (Signed)
Called [employer] to see what needs to be done to get this pt her medication. I was advised that the Xifaxin was covered but only for 2 weeks. In order for it to be covered longer ANOTHER appeal letter needs to be faxed to "extend duration" of coverage. I have written another letter and faxed to [facility] w/confirmation. Pending decision.

## 2014-12-29 NOTE — Telephone Encounter (Signed)
Pt called checking on previous message. Pamala Hurry is working on this. Please advise at  [phone]

## 2014-12-29 NOTE — Telephone Encounter (Signed)
Spoke to pt and gave her status update on 2nd letter. Pt thanked me for writing a 2nd letter and I advised I will let her know when we get a decision.

## 2014-12-30 ENCOUNTER — Ambulatory Visit: Payer: Medicare Other

## 2014-12-30 DIAGNOSIS — R269 Unspecified abnormalities of gait and mobility: Secondary | ICD-10-CM

## 2014-12-30 DIAGNOSIS — Z9181 History of falling: Secondary | ICD-10-CM

## 2014-12-30 DIAGNOSIS — R531 Weakness: Secondary | ICD-10-CM

## 2014-12-30 NOTE — Therapy (Signed)
[facility] [address] [place], [place], [address] Phone: [phone]   Fax:  [phone]  Physical Therapy Treatment  Patient Details  Name: [name] [name] MRN: [id number] Date of Birth: Aug 26, 1940 Referring Provider:  Robyn Haber, MD  Encounter Date: 12/30/2014      PT End of Session - 12/30/14 1448    Visit Number 10   Number of Visits 16   Date for PT Re-Evaluation 01/24/15   Authorization Type Medicare - G Codes every 10 sessions   PT Start Time 1408  pt arrived late   PT Stop Time 1446   PT Time Calculation (min) 38 min   Equipment Utilized During Treatment Gait belt   Activity Tolerance Patient tolerated treatment well   Behavior During Therapy [facility] for tasks assessed/performed      Past Medical History  Diagnosis Date  . Hypertension   . Arthritis   . IBS (irritable bowel syndrome)   . Allergy   . Clotting disorder   . Diabetes mellitus   . Cancer     breast and skin    Past Surgical History  Procedure Laterality Date  . Mastectomy  2002    Bilateral  . Breast lumpectomy  1996  . Abdominal hysterectomy  1985    There were no vitals filed for this visit.  Visit Diagnosis:  Abnormality of gait  Risk for falls  Weakness generalized      Subjective Assessment - 12/30/14 1411    Subjective Pt denied falls or changes since last visit.   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 No/denies                         [facility] Adult PT Treatment/Exercise - 12/30/14 1413    Ambulation/Gait   Ambulation/Gait Yes   Ambulation/Gait Assistance 5: Supervision   Ambulation/Gait Assistance Details Cues to improve B arm swing, trunk rotations and to decr. wide BOS.   Ambulation Distance (Feet) 230 Feet    Assistive device None   Gait Pattern Decreased step length - left;Decreased stride length;Decreased weight shift to right;Decreased weight shift to left;Decreased trunk rotation   Ambulation Surface Level;Indoor   Gait velocity 2.53f/sec.  no AD   Ramp 5: Supervision   Ramp Details (indicate cue type and reason) x5. Cues to improve ant. weight shifting to ascend ramp and cues to improve heel strike while descending ramp.   Balance   Balance Assessed Yes   Static Standing Balance   Static Standing - Balance Support No upper extremity supported   Static Standing - Level of Assistance 5: Stand by assistance;4: Min assist;Other (comment)  min guard   Static Standing - Comment/# of Minutes Tandem stance: 7 seconds before requiring min A to maintain balance. Modified tandem stance: 30 seconds with supervision. feet together: 1 minute with supervision.   Dynamic Standing Balance   Dynamic Standing - Balance Support No upper extremity supported   Dynamic Standing - Level of Assistance 4: Min assist;Other (comment)  min guard   Dynamic Standing - Balance Activities Step ups;Eyes open   Dynamic Standing - Comments 6" for step up and 4" for step downs x10/LE. cues to improve ant. weight shifting.   Standardized Balance Assessment   Standardized Balance Assessment Timed Up and Go  Test;Dynamic Gait Index   Dynamic Gait Index   Level Surface Mild Impairment   Change in Gait Speed Normal   Gait with Horizontal Head Turns Mild Impairment   Gait with Vertical Head Turns Mild Impairment   Gait and Pivot Turn Mild Impairment   Step Over Obstacle Normal   Step Around Obstacles Normal   Steps Mild Impairment   Total Score 19   Timed Up and Go Test   TUG Normal TUG   Normal TUG (seconds) 11.18  no AD                PT Education - 12/30/14 1448    Education provided Yes   Education Details Explained progress based on G-code assessment (outcome measures) today.   Person(s) Educated  Patient   Methods Explanation   Comprehension Verbalized understanding          PT Short Term Goals - 12/30/14 1451    PT SHORT TERM GOAL #1   Title The patient will improve gait speed from 2.18 ft/sec up to 3 ft/sec to demo transition to "full community ambulator" (2.62 ft/sec is cut-off)  classification of gait. Target Date June 14th.    Time 4   Period Weeks   Status On-going  2.69 ft/sec on 12/17/14   PT SHORT TERM GOAL #2   Title Pt will be independent with HEP to address balance, flexibility, and gait. Target Date June 14th.   Time 4   Period Weeks   Status Achieved  Met 12/17/14   PT SHORT TERM GOAL #3   Title Improve DGI from 15/24 up to 19/24 to demo decreasing risk for falls.  Target date 12/22/2014   Time 4   Period Weeks   Status Achieved  19/24 on 12/15/14   PT SHORT TERM GOAL #4   Title Pt will negotiate stairs with single hand rail modified indep with reciprocal gait pattern on steps.  Target Date June 14th.    Time 4   Period Weeks   Status On-going  Ascended with 4 stairs with 1 rail and mod I, descended with supervision due to single L toe catch   PT SHORT TERM GOAL #5   Title Demonstrate ability to ambulate 300' on level surfaces independently for decreased fall risk. Target date June 14th.    Time 4   Period Weeks   Status On-going  Pt ambulated x345' without AD but mod I secondary to gait velocity.           PT Long Term Goals - 12/17/14 2219    PT LONG TERM GOAL #1   Title pt will verbalize understanding of fall prevention strategies in home environment. Target Date July 5th.    Time 8   Period Weeks   Status On-going   PT LONG TERM GOAL #2   Title pt will report no falls over the last 4 weeks to demonstrate improved safety with functional mobility. Target date July 5th.    Time 8   Period Weeks   Status On-going   PT LONG TERM GOAL #3   Title Demonstrate ability to ambulate 500' on level surfaces independently for decreased fall risk. Target  date July 5th.    Time 8   Period Weeks   Status On-going               Plan - 12/30/14 1449    Clinical Impression Statement Pt demonstrated progress as her gait speed, TUG time, DGI and static standing  balance assist/time has improved. DGI score of 19/24 still indicates pt is at risk for falls and gait speed indicates she is a limited community ambulator. Pt would continue to benefit from skilled PT to improve strength, balance, and safety during functional mobility. Continue with POC.   Pt will benefit from skilled therapeutic intervention in order to improve on the following deficits Abnormal gait;Decreased coordination;Difficulty walking;Impaired flexibility;Postural dysfunction;Decreased balance;Decreased knowledge of use of DME;Decreased mobility;Impaired perceived functional ability   Rehab Potential Good   Clinical Impairments Affecting Rehab Potential pt reports her opthamologist told her she may have had recent TIA   PT Frequency 2x / week   PT Duration 8 weeks   PT Treatment/Interventions ADLs/Self Care Home Management;Therapeutic activities;Patient/family education;Visual/perceptual remediation/compensation;DME Instruction;Therapeutic exercise;Biofeedback;Gait training;Balance training;Electrical Stimulation;Functional mobility training;Neuromuscular re-education;Stair training   PT Next Visit Plan Continue Balance recovery reactions. Controlling anterior weight shift when descending stairs, ramp. Dynamic gait training.   Consulted and Agree with Plan of Care Patient          G-Codes - 2015-01-04 1413    Functional Assessment Tool Used Gait speed no AD: 2.90f/sec.; TUG no AD: 11.18 sec.; tandem stance: 7 seconds prior to requiring min A to maintain balance, feet together: 1 minute with supervision and modified tandem: 30 sec. without assist; DGI: 19/24   Functional Limitation Mobility: Walking and moving around   Mobility: Walking and Moving Around Current Status [phone]  At least 1 percent but less than 20 percent impaired, limited or restricted   Mobility: Walking and Moving Around Goal Status [phone] At least 1 percent but less than 20 percent impaired, limited or restricted      Problem List Patient Active Problem List   Diagnosis Date Noted  . IBS (irritable bowel syndrome) 12/01/2014  . Vertical diplopia 11/17/2014  . Non-melanoma skin cancer 05/13/2014  . Uncontrolled type 2 diabetes mellitus with nephropathy 04/22/2014  . Seasonal allergies 09/12/2011  . Anxiety 09/12/2011  . HTN (hypertension) 09/12/2011  . Breast cancer 09/12/2011  . GERD (gastroesophageal reflux disease) 09/12/2011  . Dyslipidemia 09/12/2011  . Osteopenia 09/12/2011  . DJD (degenerative joint disease) 09/12/2011  . Factor 5 Leiden mutation, heterozygous 09/12/2011  . Personal history of colonic polyps 09/12/2011    Abdoul Encinas L 6Jun 27, 2016 2:52 PM  [facility] [address]Phone: [phone]  Fax:  [phone]  Physical Therapy Progress Note  Dates of Reporting Period: 11/23/13 to 6Jun 27, 2016 Objective Reports of Subjective Statement: Pt is demonstrating improved balance during ambulation but still requires cues during dynamic gait activities and when traversing stairs/ramp.  Objective Measurements: DGI: 19/24, gait speed: 2.516fsec.; TUG: 11.18sec.  Goal Update:      PT Short Term Goals - 06Jun 27, [id number]    PT SHORT TERM GOAL #1   Title The patient will improve gait speed from 2.18 ft/sec up to 3 ft/sec to demo transition to "full community ambulator" (2.62 ft/sec is cut-off)  classification of gait. Target Date June 14th.    Time 4   Period Weeks   Status On-going  2.69 ft/sec on 12/17/14   PT SHORT TERM GOAL #2   Title Pt will be independent with HEP to address balance, flexibility, and gait. Target Date June 14th.   Time 4   Period Weeks   Status Achieved  Met  12/17/14   PT SHORT TERM GOAL #3   Title Improve DGI from 15/24 up to 19/24 to demo decreasing risk for  falls.  Target date 12/22/2014   Time 4   Period Weeks   Status Achieved  19/24 on 12/15/14   PT SHORT TERM GOAL #4   Title Pt will negotiate stairs with single hand rail modified indep with reciprocal gait pattern on steps.  Target Date June 14th.    Time 4   Period Weeks   Status On-going  Ascended with 4 stairs with 1 rail and mod I, descended with supervision due to single L toe catch   PT SHORT TERM GOAL #5   Title Demonstrate ability to ambulate 300' on level surfaces independently for decreased fall risk. Target date June 14th.    Time 4   Period Weeks   Status On-going  Pt ambulated x345' without AD but mod I secondary to gait velocity.       Plan: See notes above.  Reason Skilled Services are Required: See notes above.    Geoffry Paradise, PT,DPT 12/30/2014 2:52 PM Phone: [phone] Fax: [phone]

## 2015-01-01 ENCOUNTER — Ambulatory Visit: Payer: Medicare Other | Admitting: Rehabilitative and Restorative Service Providers"

## 2015-01-01 DIAGNOSIS — R269 Unspecified abnormalities of gait and mobility: Secondary | ICD-10-CM | POA: Diagnosis not present

## 2015-01-01 DIAGNOSIS — R531 Weakness: Secondary | ICD-10-CM

## 2015-01-01 NOTE — Therapy (Signed)
[facility] [address] [place], [place], [address] Phone: [phone]   Fax:  [phone]  Physical Therapy Treatment  Patient Details  Name: [name] [name] MRN: [id number] Date of Birth: [birth year] Referring Provider:  Robyn Haber, MD  Encounter Date: 01/01/2015      PT End of Session - 01/01/15 1627    Visit Number 11   Number of Visits 16   Date for PT Re-Evaluation 01/24/15   Authorization Type Medicare - G Codes every 10 sessions   PT Start Time 1408   PT Stop Time 1450   PT Time Calculation (min) 42 min   Equipment Utilized During Treatment Gait belt   Activity Tolerance Patient tolerated treatment well   Behavior During Therapy [facility] for tasks assessed/performed      Past Medical History  Diagnosis Date  . Hypertension   . Arthritis   . IBS (irritable bowel syndrome)   . Allergy   . Clotting disorder   . Diabetes mellitus   . Cancer     breast and skin    Past Surgical History  Procedure Laterality Date  . Mastectomy  2002    Bilateral  . Breast lumpectomy  1996  . Abdominal hysterectomy  1985    There were no vitals filed for this visit.  Visit Diagnosis:  Abnormality of gait  Weakness generalized      Subjective Assessment - 01/01/15 1409    Subjective Patient attends silver sneakers classes and had class this morning.  She had tai chi on Tuesday afternoon.  Patient doing HEP.   Currently in Pain? No/denies      NEUROMUSCULAR RE-EDUCATION: 1/2 tandem position working on rocking heel/toes with weight shifting for ankle control Cone tapping with wide to narrow movements and min A with UE support near counter intermittently Ankle coordination activities moving quickly between 12" and 6" step for targets Seated ankle coordination with alternating DF/PF activities Standing balance with narrowing base of support with CGA for safety Rocker board proactive and reactive balance  strategies with CGA to min A for safety Stepping up/down onto rocker board for balance control  THERAPEUTIC EXERCISE: Ankle dorsiflexion seated with red theraband x 10 reps Ankle eversion with red theraband x 10 reps Sit<>stand x 10 reps  Gait: Dynamic gait activities walking on level surfaces with CG assistance x 500+ feet distance performing slow/fast walking, marching, starts/stops, 180 degree turns. .       PT Short Term Goals - 12/30/14 1451    PT SHORT TERM GOAL #1   Title The patient will improve gait speed from 2.18 ft/sec up to 3 ft/sec to demo transition to "full community ambulator" (2.62 ft/sec is cut-off)  classification of gait. Target Date June 14th.    Time 4   Period Weeks   Status On-going  2.69 ft/sec on 12/17/14   PT SHORT TERM GOAL #2   Title Pt will be independent with HEP to address balance, flexibility, and gait. Target Date June 14th.   Time 4   Period Weeks   Status Achieved  Met 12/17/14   PT SHORT TERM GOAL #3   Title Improve DGI from 15/24 up to 19/24 to demo decreasing risk for falls.  Target date 12/22/2014   Time 4   Period Weeks   Status Achieved  19/24 on 12/15/14   PT SHORT TERM GOAL #4   Title Pt will negotiate stairs with single hand rail modified indep with reciprocal gait pattern  on steps.  Target Date June 14th.    Time 4   Period Weeks   Status On-going  Ascended with 4 stairs with 1 rail and mod I, descended with supervision due to single L toe catch   PT SHORT TERM GOAL #5   Title Demonstrate ability to ambulate 300' on level surfaces independently for decreased fall risk. Target date June 14th.    Time 4   Period Weeks   Status On-going  Pt ambulated x345' without AD but mod I secondary to gait velocity.           PT Long Term Goals - 12/17/14 2219    PT LONG TERM GOAL #1   Title pt will verbalize understanding of fall prevention strategies in home environment. Target Date July 5th.    Time 8   Period Weeks   Status  On-going   PT LONG TERM GOAL #2   Title pt will report no falls over the last 4 weeks to demonstrate improved safety with functional mobility. Target date July 5th.    Time 8   Period Weeks   Status On-going   PT LONG TERM GOAL #3   Title Demonstrate ability to ambulate 500' on level surfaces independently for decreased fall risk. Target date July 5th.    Time 8   Period Weeks   Status On-going               Plan - 01/01/15 1628    Clinical Impression Statement The patient continues to have decreased coordination/timing of the L ankle during gait activities.  She also has wide base of support and slowed gait speed.  Patient and PT to further discuss goals and long term plan for d/c planning.   PT Next Visit Plan Discuss LTGs with patient, d/c planning; balance recovery, anterior weight shift control; Dynamic gait training.   Consulted and Agree with Plan of Care Patient        Problem List Patient Active Problem List   Diagnosis Date Noted  . IBS (irritable bowel syndrome) 12/01/2014  . Vertical diplopia 11/17/2014  . Non-melanoma skin cancer 05/13/2014  . Uncontrolled type 2 diabetes mellitus with nephropathy 04/22/2014  . Seasonal allergies 09/12/2011  . Anxiety 09/12/2011  . HTN (hypertension) 09/12/2011  . Breast cancer 09/12/2011  . GERD (gastroesophageal reflux disease) 09/12/2011  . Dyslipidemia 09/12/2011  . Osteopenia 09/12/2011  . DJD (degenerative joint disease) 09/12/2011  . Factor 5 Leiden mutation, heterozygous 09/12/2011  . Personal history of colonic polyps 09/12/2011    Jarrett Chicoine, PT 01/01/2015, 4:30 PM  [name] [address] [place], [place], [address] Phone: [phone]   Fax:  [phone]

## 2015-01-05 ENCOUNTER — Ambulatory Visit: Payer: Medicare Other | Admitting: Rehabilitative and Restorative Service Providers"

## 2015-01-05 DIAGNOSIS — R531 Weakness: Secondary | ICD-10-CM

## 2015-01-05 DIAGNOSIS — R269 Unspecified abnormalities of gait and mobility: Secondary | ICD-10-CM

## 2015-01-05 NOTE — Therapy (Signed)
[facility] [address] [place], [place], [address] Phone: [phone]   Fax:  [phone]  Physical Therapy Treatment  Patient Details  Name: [name] [name] MRN: [id number] Date of Birth: October 23, 1940 Referring Provider:  Robyn Haber, MD  Encounter Date: 01/05/2015      PT End of Session - 01/05/15 1427    Visit Number 12   Number of Visits 16   Date for PT Re-Evaluation 01/24/15   Authorization Type Medicare - G Codes every 10 sessions   PT Start Time 1238   PT Stop Time 1320   PT Time Calculation (min) 42 min   Equipment Utilized During Treatment Gait belt   Activity Tolerance Patient tolerated treatment well   Behavior During Therapy [facility] for tasks assessed/performed      Past Medical History  Diagnosis Date  . Hypertension   . Arthritis   . IBS (irritable bowel syndrome)   . Allergy   . Clotting disorder   . Diabetes mellitus   . Cancer     breast and skin    Past Surgical History  Procedure Laterality Date  . Mastectomy  2002    Bilateral  . Breast lumpectomy  1996  . Abdominal hysterectomy  1985    There were no vitals filed for this visit.  Visit Diagnosis:  Abnormality of gait  Weakness generalized      Subjective Assessment - 01/05/15 1241    Subjective Patient feels symptoms are inconsistent day to day.  She has difficulty negotiating steps with one handrail, walking downhill, has been working on unloading arms at car in passenger side before walking around to drivers side.  Her gait is still slow and guarded and she describes a "wobbly" sensation.     Currently in Pain? No/denies     NEUROMUSCULAR RE-EDUCATION: Rocker board training activities with reactive balance and proactive balance, standing on board with head turns and reaching tasks, then performing with eyes closed and maintaining midline with CGA to min A for postural corrections Alternating LE foot taps to cones  for single limb stance activities Standing stride position shifting weight anterior/posterior with ankle DF/PF added for coordination  Gait: Dynamic gait activities with ball toss and CGA x 250 ft, gait with direction changes, starts/stops, 180 degree turns, negotiation of cones, and speed changes with CGA for safety Stair negotiation with one handrail and step to pattern progressing to reciprocal pattern modified independently 66M=2.53 ft/sec   SELF CARE/HOME MANAGEMENT: Discussed patient's goals for physical therapy and community exercise involvement.  She performs silver sneakers 2x/week and intermittent tai chi at a local church when instructor available.  The patient and PT discussed h/o double vision and relationship of visual changes + peripheral neuropathy to current imbalance.  The patient feels that therapy is helping and she is motivated to continue to participate.                              PT Education - 01/05/15 1426    Education provided Yes   Education Details PT and patient discussed d/c vs. continuing PT.  Patient requests to continue with specific discussion of difficult tasks noted to guide therapy goals/treatment interventions.   Person(s) Educated Patient   Methods Explanation   Comprehension Verbalized understanding          PT Short Term Goals - 01/05/15 1259    PT SHORT TERM GOAL #1  Title The patient will improve gait speed from 2.18 ft/sec up to 3 ft/sec to demo transition to "full community ambulator" (2.62 ft/sec is cut-off)  classification of gait. Target Date June 14th.    Baseline Gait speed on 6/28 is 2.53 and varies from session to session   Time 4   Period Weeks   Status On-going  2.69 ft/sec on 12/17/14   PT SHORT TERM GOAL #2   Title Pt will be independent with HEP to address balance, flexibility, and gait. Target Date June 14th.   Time 4   Period Weeks   Status Achieved  Met 12/17/14   PT SHORT TERM GOAL #3   Title  Improve DGI from 15/24 up to 19/24 to demo decreasing risk for falls.  Target date 12/22/2014   Time 4   Period Weeks   Status Achieved  19/24 on 12/15/14   PT SHORT TERM GOAL #4   Title Pt will negotiate stairs with single hand rail modified indep with reciprocal gait pattern on steps.  Target Date June 14th.    Baseline Met on 01/05/2015   Time 4   Period Weeks   Status Achieved   PT SHORT TERM GOAL #5   Title Demonstrate ability to ambulate 300' on level surfaces independently for decreased fall risk. Target date June 14th.    Time 4   Period Weeks   Status On-going  Pt ambulated x345' without AD but mod I secondary to gait velocity.           PT Long Term Goals - 01/05/15 1312    PT LONG TERM GOAL #1   Title pt will verbalize understanding of fall prevention strategies in home environment. Target Date July 5th.    Baseline Met on 6/28 providing HEP.   Time 8   Period Weeks   Status Achieved   PT LONG TERM GOAL #2   Title pt will report no falls over the last 4 weeks to demonstrate improved safety with functional mobility. Target date July 5th.    Baseline Met on 01/05/15 with no falls reported.   Time 8   Period Weeks   Status Achieved   PT LONG TERM GOAL #3   Title Demonstrate ability to ambulate 500' on level surfaces independently for decreased fall risk.    Baseline Target date 01/23/2015   Time 4   Period Weeks   Status On-going   PT LONG TERM GOAL #4   Title The patient will improve gait speed from 2.53 ft/sec (on 6/28) to > or equal to 3.0 ft/sec to demo improving community mobility.   Baseline Target date 01/23/2015   Time 4   Period Weeks   Status On-going   PT LONG TERM GOAL #5   Title The patient will negotiate inclines/declines and curbs modified indep for improved community access.   Baseline Target date 01/23/2015   Time 4   Period Weeks   Status New               Plan - 01/05/15 1428    Clinical Impression Statement The patient has met 2  LTGs. She expresses desire to continue therapy with specific goals of improving negotiation of unlevel surfaces and stair negotiation.    Pt will benefit from skilled therapeutic intervention in order to improve on the following deficits Abnormal gait;Decreased coordination;Difficulty walking;Impaired flexibility;Postural dysfunction;Decreased balance;Decreased knowledge of use of DME;Decreased mobility;Impaired perceived functional ability   Rehab Potential Good   PT Frequency 2x /  week   PT Duration 4 weeks   PT Treatment/Interventions ADLs/Self Care Home Management;Therapeutic activities;Patient/family education;Visual/perceptual remediation/compensation;DME Instruction;Therapeutic exercise;Biofeedback;Gait training;Balance training;Electrical Stimulation;Functional mobility training;Neuromuscular re-education;Stair training   PT Next Visit Plan Continue PT focusing on dynamic gait activities, downhill and uphill walking, community surface negotiation.   Recommended Other Services *Cert due on 1/54/8845 *will need to update goals if continuing/ extended current plan to meet initial frequency.   Consulted and Agree with Plan of Care Patient        Problem List Patient Active Problem List   Diagnosis Date Noted  . IBS (irritable bowel syndrome) 12/01/2014  . Vertical diplopia 11/17/2014  . Non-melanoma skin cancer 05/13/2014  . Uncontrolled type 2 diabetes mellitus with nephropathy 04/22/2014  . Seasonal allergies 09/12/2011  . Anxiety 09/12/2011  . HTN (hypertension) 09/12/2011  . Breast cancer 09/12/2011  . GERD (gastroesophageal reflux disease) 09/12/2011  . Dyslipidemia 09/12/2011  . Osteopenia 09/12/2011  . DJD (degenerative joint disease) 09/12/2011  . Factor 5 Leiden mutation, heterozygous 09/12/2011  . Personal history of colonic polyps 09/12/2011    Dorrien Grunder, PT 01/06/2015, 11:32 AM  [facility] [address]  [place], [place], [address] Phone: [phone]   Fax:  [phone]

## 2015-01-08 ENCOUNTER — Ambulatory Visit: Payer: Medicare Other | Attending: Family Medicine

## 2015-01-08 DIAGNOSIS — R531 Weakness: Secondary | ICD-10-CM | POA: Insufficient documentation

## 2015-01-08 DIAGNOSIS — R269 Unspecified abnormalities of gait and mobility: Secondary | ICD-10-CM | POA: Insufficient documentation

## 2015-01-08 DIAGNOSIS — Z9181 History of falling: Secondary | ICD-10-CM | POA: Insufficient documentation

## 2015-01-08 NOTE — Telephone Encounter (Signed)
Pt CB and gave me the phone number that she had called before about the first appeal, Emilie Rutter [phone], and I called and spoke to him again. He is going to email the person over him who may be able to give me more info and would expect him to call me back by this afternoon. Notified pt.

## 2015-01-08 NOTE — Telephone Encounter (Signed)
Pt called back to check status of 2nd appeal. We have not heard any decision on this yet. I spent over 20 mins and several transfers on phone w/[employer] trying to get to the Appeals dept to check status. I was told that I had to speak to [facility] about any medication issue, and transferred there. After another 15 mins I was told that the APPEALS for medications are handled by [facility] themselves, but she did another PA for Xifaxin w/the info that she has tried/failed preferred dicyclomine. It was approved for only another 2 weeks and she informed me that it can only be approved for 2 weeks and only a maximum of 3 times and then will not be covered any more. So we have one more PA available for another 2 weeks. She informed me that this is the limit on this medication, but since we were advised before that we could do a 2nd appeal to extend, I am going to try faxing another message to Appeal Dept to see if I can get an answer. LMOM for pt to CB to give status.

## 2015-01-08 NOTE — Therapy (Signed)
[facility] [address] [place], [place], [address] Phone: [phone]   Fax:  [phone]  Physical Therapy Treatment  Patient Details  Name: [name] [name] MRN: [id number] Date of Birth: Jan 16, 1941 Referring Provider:  Robyn Haber, MD  Encounter Date: 01/08/2015      PT End of Session - 01/08/15 1841    Visit Number 13   Number of Visits 16   Date for PT Re-Evaluation 01/24/15   Authorization Type Medicare - G Codes every 10 sessions   PT Start Time 5883  pt arrived late   PT Stop Time 1529   PT Time Calculation (min) 31 min   Equipment Utilized During Treatment Gait belt   Activity Tolerance Patient tolerated treatment well   Behavior During Therapy [facility] for tasks assessed/performed      Past Medical History  Diagnosis Date  . Hypertension   . Arthritis   . IBS (irritable bowel syndrome)   . Allergy   . Clotting disorder   . Diabetes mellitus   . Cancer     breast and skin    Past Surgical History  Procedure Laterality Date  . Mastectomy  2002    Bilateral  . Breast lumpectomy  1996  . Abdominal hysterectomy  1985    There were no vitals filed for this visit.  Visit Diagnosis:  Abnormality of gait      Subjective Assessment - 01/08/15 1501    Subjective Pt denied falls or changes since last visit.    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 No/denies                         [facility] Adult PT Treatment/Exercise - 01/08/15 1503    Ambulation/Gait   Ambulation/Gait Yes   Ambulation/Gait Assistance 5: Supervision;4: Min guard   Ambulation/Gait Assistance Details Cues to decreaes wide BOS, improve heel strike/stride length, and to improve trunk rotation. Pt was noted to experience improved arm  swing. Pt ambulated indoors with ladder to improve stride length.    Ambulation Distance (Feet) --  700' outdoors, 345' indoors   Assistive device None   Gait Pattern Decreased step length - left;Decreased stride length;Decreased weight shift to right;Decreased weight shift to left;Decreased trunk rotation   Ambulation Surface Level;Unlevel;Indoor;Outdoor;Paved;Grass;Other (comment)  rubber mulch   Balance   Balance Assessed Yes   Dynamic Standing Balance   Dynamic Standing - Balance Support Right upper extremity supported;No upper extremity supported   Dynamic Standing - Level of Assistance 4: Min assist;Other (comment)  min guard   Dynamic Standing - Balance Activities Other (comment)   Dynamic Standing - Comments In //bars with pt progressing from 1 to 0 UE support: pt performed SLS with non-stance LE on soccer ball, moving ball in ant/post/lat and circular motions. Pt performed alternating toe taps on soccer ball x10/LE. Min A required with no UE support, otherwise min guard. Cues to keep hips level and to improve lateral weight shifting. Pt also performed ant. stepping while downhill on ramp, on compliant and non-compliant surface to improve ant/post weight shifting x10/LE.                   PT Short Term Goals - 01/05/15 1259    PT SHORT TERM GOAL #1  Title The patient will improve gait speed from 2.18 ft/sec up to 3 ft/sec to demo transition to "full community ambulator" (2.62 ft/sec is cut-off)  classification of gait. Target Date June 14th.    Baseline Gait speed on 6/28 is 2.53 and varies from session to session   Time 4   Period Weeks   Status On-going  2.69 ft/sec on 12/17/14   PT SHORT TERM GOAL #2   Title Pt will be independent with HEP to address balance, flexibility, and gait. Target Date June 14th.   Time 4   Period Weeks   Status Achieved  Met 12/17/14   PT SHORT TERM GOAL #3   Title Improve DGI from 15/24 up to 19/24 to demo decreasing risk for falls.   Target date 12/22/2014   Time 4   Period Weeks   Status Achieved  19/24 on 12/15/14   PT SHORT TERM GOAL #4   Title Pt will negotiate stairs with single hand rail modified indep with reciprocal gait pattern on steps.  Target Date June 14th.    Baseline Met on 01/05/2015   Time 4   Period Weeks   Status Achieved   PT SHORT TERM GOAL #5   Title Demonstrate ability to ambulate 300' on level surfaces independently for decreased fall risk. Target date June 14th.    Time 4   Period Weeks   Status On-going  Pt ambulated x345' without AD but mod I secondary to gait velocity.           PT Long Term Goals - 01/08/15 1848    PT LONG TERM GOAL #1   Title pt will verbalize understanding of fall prevention strategies in home environment. Target Date July 5th.    Baseline Met on 6/28 providing HEP.   Time 8   Period Weeks   Status Achieved   PT LONG TERM GOAL #2   Title pt will report no falls over the last 4 weeks to demonstrate improved safety with functional mobility. Target date July 5th.    Baseline Met on 01/05/15 with no falls reported.   Time 8   Period Weeks   Status Achieved   PT LONG TERM GOAL #3   Title Demonstrate ability to ambulate 500' on level surfaces independently for decreased fall risk.    Baseline Target date 01/23/2015   Time 4   Period Weeks   Status On-going   PT LONG TERM GOAL #4   Title The patient will improve gait speed from 2.53 ft/sec (on 6/28) to > or equal to 3.0 ft/sec to demo improving community mobility.   Baseline Target date 01/23/2015   Time 4   Period Weeks   Status On-going   PT LONG TERM GOAL #5   Title The patient will negotiate inclines/declines and curbs modified indep for improved community access.   Baseline Target date 01/23/2015   Time 4   Period Weeks   Status On-going               Plan - 01/08/15 1841    Clinical Impression Statement Pt continues to ambulate with wide BOS, decreased stride length, decreased trunk rotation  and experiences difficulty weight shifting while traversing hill (up/down). Pt was able to improve stride length after ambulating through ladder obstacle on floor, which carried over to ambulation over even terrain. Pt may benefit from balance/therex which includes trunk rotation, along with stepping strategies to improve balance during ambulation. Continue with POC.   Pt will  benefit from skilled therapeutic intervention in order to improve on the following deficits Abnormal gait;Decreased coordination;Difficulty walking;Impaired flexibility;Postural dysfunction;Decreased balance;Decreased knowledge of use of DME;Decreased mobility;Impaired perceived functional ability   Rehab Potential Good   Clinical Impairments Affecting Rehab Potential pt reports her opthamologist told her she may have had recent TIA   PT Frequency 2x / week   PT Duration 8 weeks   PT Treatment/Interventions ADLs/Self Care Home Management;Therapeutic activities;Patient/family education;Visual/perceptual remediation/compensation;DME Instruction;Therapeutic exercise;Biofeedback;Gait training;Balance training;Electrical Stimulation;Functional mobility training;Neuromuscular re-education;Stair training   PT Next Visit Plan Continue PT focusing on dynamic gait activities, downhill terrain, carrying items in one hand during gait/stair activities, have pt perform sit<>stand from compliant low surface to mimic squishy sofa.   Consulted and Agree with Plan of Care Patient        Problem List Patient Active Problem List   Diagnosis Date Noted  . IBS (irritable bowel syndrome) 12/01/2014  . Vertical diplopia 11/17/2014  . Non-melanoma skin cancer 05/13/2014  . Uncontrolled type 2 diabetes mellitus with nephropathy 04/22/2014  . Seasonal allergies 09/12/2011  . Anxiety 09/12/2011  . HTN (hypertension) 09/12/2011  . Breast cancer 09/12/2011  . GERD (gastroesophageal reflux disease) 09/12/2011  . Dyslipidemia 09/12/2011  .  Osteopenia 09/12/2011  . DJD (degenerative joint disease) 09/12/2011  . Factor 5 Leiden mutation, heterozygous 09/12/2011  . Personal history of colonic polyps 09/12/2011    Sirinity Outland L 01/08/2015, 6:49 PM  [facility] [address] [place], [place], [address] Phone: [phone]   Fax:  [phone]     Geoffry Paradise, PT,DPT 01/08/2015 6:49 PM Phone: [phone] Fax: [phone]

## 2015-01-08 NOTE — Telephone Encounter (Signed)
I

## 2015-01-10 ENCOUNTER — Other Ambulatory Visit: Payer: Self-pay | Admitting: Physician Assistant

## 2015-01-10 ENCOUNTER — Other Ambulatory Visit: Payer: Self-pay | Admitting: Family Medicine

## 2015-01-10 ENCOUNTER — Other Ambulatory Visit: Payer: Self-pay | Admitting: Internal Medicine

## 2015-01-12 ENCOUNTER — Ambulatory Visit: Payer: Medicare Other

## 2015-01-12 DIAGNOSIS — R269 Unspecified abnormalities of gait and mobility: Secondary | ICD-10-CM

## 2015-01-12 DIAGNOSIS — R531 Weakness: Secondary | ICD-10-CM

## 2015-01-12 DIAGNOSIS — Z9181 History of falling: Secondary | ICD-10-CM

## 2015-01-12 NOTE — Therapy (Signed)
[facility] [address] [place], [place], [address] Phone: [phone]   Fax:  [phone]  Physical Therapy Treatment  Patient Details  Name: [name] [name] MRN: [id number] Date of Birth: Jun 12, 1941 Referring Provider:  Robyn Haber, MD  Encounter Date: 01/12/2015      PT End of Session - 01/12/15 1554    Visit Number 14   Number of Visits 16   Date for PT Re-Evaluation 01/24/15   Authorization Type Medicare - G Codes every 10 sessions   PT Start Time 1406   PT Stop Time 1444   PT Time Calculation (min) 38 min   Equipment Utilized During Treatment Gait belt   Activity Tolerance Patient tolerated treatment well   Behavior During Therapy [facility] for tasks assessed/performed      Past Medical History  Diagnosis Date  . Hypertension   . Arthritis   . IBS (irritable bowel syndrome)   . Allergy   . Clotting disorder   . Diabetes mellitus   . Cancer     breast and skin    Past Surgical History  Procedure Laterality Date  . Mastectomy  2002    Bilateral  . Breast lumpectomy  1996  . Abdominal hysterectomy  1985    There were no vitals filed for this visit.  Visit Diagnosis:  Abnormality of gait  Weakness generalized  Risk for falls      Subjective Assessment - 01/12/15 1409    Subjective Pt denied falls or changes since last visit.   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 No/denies         There act: Pt performed sit<>sidelying transfers x3 on mat, with demonstration, VC's and tactile cues for technique. Pt performed with min guard to supervision to ensure safety. PT provided handout at end of session.  Neuro re-ed: Pt performed sit<>stand to/from mat, stool, and on mat with wedge placed under B LEs to improve  anterior weight shifting, on mat with pillows placed under pt (compliant surface). Each performed 2x5reps. Cues for anterior weight shifting and to decrease retropulsion. Pt required rest breaks after each set 2/2 fatigue. Performed with min guard-min A to maintain balance.                 PT Education - 01/12/15 1600    Education provided Yes   Education Details bed mobility to improve technique and safety.   Person(s) Educated Patient   Methods Explanation;Demonstration;Tactile cues;Verbal cues;Handout   Comprehension Returned demonstration;Verbalized understanding          PT Short Term Goals - 01/05/15 1259    PT SHORT TERM GOAL #1   Title The patient will improve gait speed from 2.18 ft/sec up to 3 ft/sec to demo transition to "full community ambulator" (2.62 ft/sec is cut-off)  classification of gait. Target Date June 14th.    Baseline Gait speed on 6/28 is 2.53 and varies from session to session   Time 4   Period Weeks   Status On-going  2.69 ft/sec on 12/17/14   PT SHORT TERM GOAL #2   Title Pt will be independent with HEP to address balance, flexibility, and gait. Target Date June 14th.   Time 4   Period Weeks   Status Achieved  Met 12/17/14   PT SHORT TERM GOAL #  3   Title Improve DGI from 15/24 up to 19/24 to demo decreasing risk for falls.  Target date 12/22/2014   Time 4   Period Weeks   Status Achieved  19/24 on 12/15/14   PT SHORT TERM GOAL #4   Title Pt will negotiate stairs with single hand rail modified indep with reciprocal gait pattern on steps.  Target Date June 14th.    Baseline Met on 01/05/2015   Time 4   Period Weeks   Status Achieved   PT SHORT TERM GOAL #5   Title Demonstrate ability to ambulate 300' on level surfaces independently for decreased fall risk. Target date June 14th.    Time 4   Period Weeks   Status On-going  Pt ambulated x345' without AD but mod I secondary to gait velocity.           PT Long Term Goals - 01/08/15 1848     PT LONG TERM GOAL #1   Title pt will verbalize understanding of fall prevention strategies in home environment. Target Date July 5th.    Baseline Met on 6/28 providing HEP.   Time 8   Period Weeks   Status Achieved   PT LONG TERM GOAL #2   Title pt will report no falls over the last 4 weeks to demonstrate improved safety with functional mobility. Target date July 5th.    Baseline Met on 01/05/15 with no falls reported.   Time 8   Period Weeks   Status Achieved   PT LONG TERM GOAL #3   Title Demonstrate ability to ambulate 500' on level surfaces independently for decreased fall risk.    Baseline Target date 01/23/2015   Time 4   Period Weeks   Status On-going   PT LONG TERM GOAL #4   Title The patient will improve gait speed from 2.53 ft/sec (on 6/28) to > or equal to 3.0 ft/sec to demo improving community mobility.   Baseline Target date 01/23/2015   Time 4   Period Weeks   Status On-going   PT LONG TERM GOAL #5   Title The patient will negotiate inclines/declines and curbs modified indep for improved community access.   Baseline Target date 01/23/2015   Time 4   Period Weeks   Status On-going               Plan - 01/12/15 1554    Clinical Impression Statement Pt demonstrated progress as she was able to perform sit<>stand with wedge placed on floor in downhill direction to improve ant. wt. shifting. Pt progressed from min A to min guard during sit<>stand transfers. Continue with POC.   Pt will benefit from skilled therapeutic intervention in order to improve on the following deficits Abnormal gait;Decreased coordination;Difficulty walking;Impaired flexibility;Postural dysfunction;Decreased balance;Decreased knowledge of use of DME;Decreased mobility;Impaired perceived functional ability   Rehab Potential Good   Clinical Impairments Affecting Rehab Potential pt reports her opthamologist told her she may have had recent TIA   PT Frequency 2x / week   PT Duration 8 weeks    PT Treatment/Interventions ADLs/Self Care Home Management;Therapeutic activities;Patient/family education;Visual/perceptual remediation/compensation;DME Instruction;Therapeutic exercise;Biofeedback;Gait training;Balance training;Electrical Stimulation;Functional mobility training;Neuromuscular re-education;Stair training   PT Next Visit Plan Continue PT focusing on dynamic gait activities, downhill terrain, carrying items in one hand during gait/stair activities.   Consulted and Agree with Plan of Care Patient        Problem List Patient Active Problem List   Diagnosis Date Noted  . IBS (irritable  bowel syndrome) 12/01/2014  . Vertical diplopia 11/17/2014  . Non-melanoma skin cancer 05/13/2014  . Uncontrolled type 2 diabetes mellitus with nephropathy 04/22/2014  . Seasonal allergies 09/12/2011  . Anxiety 09/12/2011  . HTN (hypertension) 09/12/2011  . Breast cancer 09/12/2011  . GERD (gastroesophageal reflux disease) 09/12/2011  . Dyslipidemia 09/12/2011  . Osteopenia 09/12/2011  . DJD (degenerative joint disease) 09/12/2011  . Factor 5 Leiden mutation, heterozygous 09/12/2011  . Personal history of colonic polyps 09/12/2011    Machel Violante L 01/12/2015, 4:00 PM  [facility] [address] [place], [place], [address] Phone: [phone]   Fax:  [phone]     Geoffry Paradise, PT,DPT 01/12/2015 4:00 PM Phone: [phone] Fax: [phone]

## 2015-01-12 NOTE — Patient Instructions (Signed)
Getting Into / Out of Bed   Lower self to lie down on one side by raising legs and lowering head at the same time. Use arms to assist moving without twisting. Bend both knees to roll onto back if desired. To sit up, start from lying on side, and use same move-ments in reverse. Keep trunk aligned with legs.   Copyright  VHI. All rights reserved.  BED MOBILITY: Side-Lying to Sit   Lie on side, move legs to edge of bed. Push down with both hands while moving legs off bed to reach sitting position.  Copyright  VHI. All rights reserved.

## 2015-01-15 ENCOUNTER — Ambulatory Visit: Payer: Medicare Other

## 2015-01-15 DIAGNOSIS — Z9181 History of falling: Secondary | ICD-10-CM

## 2015-01-15 DIAGNOSIS — R269 Unspecified abnormalities of gait and mobility: Secondary | ICD-10-CM | POA: Diagnosis not present

## 2015-01-15 NOTE — Therapy (Signed)
[facility] [address] [place], [place], [address] Phone: [phone]   Fax:  [phone]  Physical Therapy Treatment  Patient Details  Name: [name] [name] MRN: [id number] Date of Birth: 1940/08/01 Referring Provider:  Robyn Haber, MD  Encounter Date: 01/15/2015      PT End of Session - 01/15/15 1554    Visit Number 15   Number of Visits 16   Date for PT Re-Evaluation 01/24/15   Authorization Type Medicare - G Codes every 10 sessions   PT Start Time 1455   PT Stop Time 1536   PT Time Calculation (min) 41 min   Equipment Utilized During Treatment Gait belt   Activity Tolerance Patient tolerated treatment well   Behavior During Therapy [facility] for tasks assessed/performed      Past Medical History  Diagnosis Date  . Hypertension   . Arthritis   . IBS (irritable bowel syndrome)   . Allergy   . Clotting disorder   . Diabetes mellitus   . Cancer     breast and skin    Past Surgical History  Procedure Laterality Date  . Mastectomy  2002    Bilateral  . Breast lumpectomy  1996  . Abdominal hysterectomy  1985    There were no vitals filed for this visit.  Visit Diagnosis:  Abnormality of gait  Risk for falls      Subjective Assessment - 01/15/15 1500    Subjective Pt denied falls since last visit. Pt reported she has an IBS flare up right now and is a little fatigued.   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 No/denies                         [facility] Adult PT Treatment/Exercise - 01/15/15 1546    Ambulation/Gait   Ambulation/Gait Yes   Ambulation/Gait Assistance 4: Min guard   Ambulation/Gait Assistance Details Pt ambulated while performing ball toss 117'x2, pt ambulated with one foot on white tile  and one foot on blue tile of track to decr. wide BOS. Cues to continue amb. while tossing ball and to decr. posterior trunk lean.   Ambulation Distance (Feet) --  230 and 117'   Assistive device None   Gait Pattern Decreased step length - right;Decreased step length - left;Right flexed knee in stance;Left flexed knee in stance;Decreased trunk rotation;Wide base of support;Poor foot clearance - left;Poor foot clearance - right   Ramp --   Gait Comments --   Dynamic Standing Balance   Dynamic Standing - Balance Support Bilateral upper extremity supported;No upper extremity supported   Dynamic Standing - Level of Assistance 4: Min assist;Other (comment)  min guard   Dynamic Standing - Balance Activities Lateral lean/weight shifting;Forward lean/weight shifting;Rocker board;Eyes closed;Eyes open;Head turns;Head nods  large rockerboard and bosu ball   Lateral lean/weight shifting comments: Cues to improve ant. weight shifting,as pt with tendency to lean in posterior direction.   Forward lean/weight shifting comments: Cues to shift weight to ball of toes to improve anterior wt. shifting.   Eyes closed comments: Cues to keep weight in center vs. posterior direction.    Head turns comments: Tactile cues to not move trunk with head/neck.   Head nods comments: Tactile cues to keep trunk still.   Dynamic  Standing - Comments All activities performed on bosu and rockerboard. Pt required min guard to min A to maintain balance. Pt also performed catching/tossing 1.5kg ball in all directions on compliant/non-compliant surfaces, cues to shift weight forward vs. posterior.   High Level Balance   High Level Balance Activities Side stepping;Other (comment);Tandem walking  on compliant surface (blue beam).   High Level Balance Comments In //bars with 1-2 UE support: pt required cues to improve weight shifting and to look straight ahead. 4x7'/activity.                PT Education - 01/15/15 1554     Education provided Yes   Education Details PT discussed potential d/c next week, if pt meets goals.   Person(s) Educated Patient   Methods Explanation   Comprehension Verbalized understanding          PT Short Term Goals - 01/05/15 1259    PT SHORT TERM GOAL #1   Title The patient will improve gait speed from 2.18 ft/sec up to 3 ft/sec to demo transition to "full community ambulator" (2.62 ft/sec is cut-off)  classification of gait. Target Date June 14th.    Baseline Gait speed on 6/28 is 2.53 and varies from session to session   Time 4   Period Weeks   Status On-going  2.69 ft/sec on 12/17/14   PT SHORT TERM GOAL #2   Title Pt will be independent with HEP to address balance, flexibility, and gait. Target Date June 14th.   Time 4   Period Weeks   Status Achieved  Met 12/17/14   PT SHORT TERM GOAL #3   Title Improve DGI from 15/24 up to 19/24 to demo decreasing risk for falls.  Target date 12/22/2014   Time 4   Period Weeks   Status Achieved  19/24 on 12/15/14   PT SHORT TERM GOAL #4   Title Pt will negotiate stairs with single hand rail modified indep with reciprocal gait pattern on steps.  Target Date June 14th.    Baseline Met on 01/05/2015   Time 4   Period Weeks   Status Achieved   PT SHORT TERM GOAL #5   Title Demonstrate ability to ambulate 300' on level surfaces independently for decreased fall risk. Target date June 14th.    Time 4   Period Weeks   Status On-going  Pt ambulated x345' without AD but mod I secondary to gait velocity.           PT Long Term Goals - 01/08/15 1848    PT LONG TERM GOAL #1   Title pt will verbalize understanding of fall prevention strategies in home environment. Target Date July 5th.    Baseline Met on 6/28 providing HEP.   Time 8   Period Weeks   Status Achieved   PT LONG TERM GOAL #2   Title pt will report no falls over the last 4 weeks to demonstrate improved safety with functional mobility. Target date July 5th.    Baseline Met  on 01/05/15 with no falls reported.   Time 8   Period Weeks   Status Achieved   PT LONG TERM GOAL #3   Title Demonstrate ability to ambulate 500' on level surfaces independently for decreased fall risk.    Baseline Target date 01/23/2015   Time 4   Period Weeks   Status On-going   PT LONG TERM GOAL #4   Title The patient will improve gait speed from 2.53 ft/sec (on  6/28) to > or equal to 3.0 ft/sec to demo improving community mobility.   Baseline Target date 01/23/2015   Time 4   Period Weeks   Status On-going   PT LONG TERM GOAL #5   Title The patient will negotiate inclines/declines and curbs modified indep for improved community access.   Baseline Target date 01/23/2015   Time 4   Period Weeks   Status On-going               Plan - 01/15/15 1555    Clinical Impression Statement Pt continues to experience posterior trunk lean, especially during dynamic balance activities. Pt also has difficulty with isolating head turns without trunk movement. Pt would continue to benefit from skilled PT to improve functional mobility.   Pt will benefit from skilled therapeutic intervention in order to improve on the following deficits Abnormal gait;Decreased coordination;Difficulty walking;Impaired flexibility;Postural dysfunction;Decreased balance;Decreased knowledge of use of DME;Decreased mobility;Impaired perceived functional ability   Rehab Potential Good   Clinical Impairments Affecting Rehab Potential pt reports her opthamologist told her she may have had recent TIA   PT Frequency 2x / week   PT Duration 8 weeks   PT Treatment/Interventions ADLs/Self Care Home Management;Therapeutic activities;Patient/family education;Visual/perceptual remediation/compensation;DME Instruction;Therapeutic exercise;Biofeedback;Gait training;Balance training;Electrical Stimulation;Functional mobility training;Neuromuscular re-education;Stair training   PT Next Visit Plan Begin to assess LTGs.   Consulted  and Agree with Plan of Care Patient        Problem List Patient Active Problem List   Diagnosis Date Noted  . IBS (irritable bowel syndrome) 12/01/2014  . Vertical diplopia 11/17/2014  . Non-melanoma skin cancer 05/13/2014  . Uncontrolled type 2 diabetes mellitus with nephropathy 04/22/2014  . Seasonal allergies 09/12/2011  . Anxiety 09/12/2011  . HTN (hypertension) 09/12/2011  . Breast cancer 09/12/2011  . GERD (gastroesophageal reflux disease) 09/12/2011  . Dyslipidemia 09/12/2011  . Osteopenia 09/12/2011  . DJD (degenerative joint disease) 09/12/2011  . Factor 5 Leiden mutation, heterozygous 09/12/2011  . Personal history of colonic polyps 09/12/2011    Liela Rylee L 01/15/2015, 3:58 PM  [facility] [address] [place], [place], [address] Phone: [phone]   Fax:  [phone]     Geoffry Paradise, PT,DPT 01/15/2015 3:58 PM Phone: [phone] Fax: [phone]

## 2015-01-21 ENCOUNTER — Ambulatory Visit: Payer: Medicare Other

## 2015-01-21 DIAGNOSIS — R269 Unspecified abnormalities of gait and mobility: Secondary | ICD-10-CM

## 2015-01-21 DIAGNOSIS — Z9181 History of falling: Secondary | ICD-10-CM

## 2015-01-21 DIAGNOSIS — R531 Weakness: Secondary | ICD-10-CM

## 2015-01-21 NOTE — Therapy (Signed)
[facility] [address] [place], [place], [address] Phone: [phone]   Fax:  [phone]  Physical Therapy Treatment  Patient Details  Name: [name] [name] MRN: [id number] Date of Birth: 1940/10/17 Referring Provider:  Robyn Haber, MD  Encounter Date: 01/21/2015      PT End of Session - 01/21/15 1653    Visit Number 16   Number of Visits 17   Date for PT Re-Evaluation 01/24/15   Authorization Type Medicare - G Codes every 10 sessions   PT Start Time 1327   PT Stop Time 1400   PT Time Calculation (min) 33 min   Activity Tolerance Patient tolerated treatment well   Behavior During Therapy [facility] for tasks assessed/performed      Past Medical History  Diagnosis Date  . Hypertension   . Arthritis   . IBS (irritable bowel syndrome)   . Allergy   . Clotting disorder   . Diabetes mellitus   . Cancer     breast and skin    Past Surgical History  Procedure Laterality Date  . Mastectomy  2002    Bilateral  . Breast lumpectomy  1996  . Abdominal hysterectomy  1985    There were no vitals filed for this visit.  Visit Diagnosis:  Weakness generalized  Abnormality of gait  Risk for falls      Subjective Assessment - 01/21/15 1329    Subjective (p) Pt reported a "semi-fall" when trying to pick something up off the floor, while holding onto rolling chair. Pt had difficulty for getting off floor.    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 (p) No/denies         There act: Pt performed floor<>stand transfer on red mat x5 with min guard to min A to maintain balance and to ensure safety. Pt required extensive tactile and VC's for technique. PT also demonstrated transfers and hand placement. PT broke down each  component of the transfer and had pt practice. Pt performed posterior scoots (mini bridge to lift buttocks) across mat, in order to reach a solid surface to perform floor to stand transfer (as pt reported she typically falls in places where furniture is out of reach).                         PT Education - 01/21/15 1652    Education provided Yes   Education Details How to perform floor transfers. PT also discussed possible d/c tomorrow based on progress and goals.   Person(s) Educated Patient   Methods Explanation;Demonstration;Tactile cues;Verbal cues   Comprehension Verbalized understanding;Need further instruction;Returned demonstration          PT Short Term Goals - 01/05/15 1259    PT SHORT TERM GOAL #1   Title The patient will improve gait speed from 2.18 ft/sec up to 3 ft/sec to demo transition to "full community ambulator" (2.62 ft/sec is cut-off)  classification of gait. Target Date June 14th.    Baseline Gait speed on 6/28 is 2.53 and varies from session to session   Time 4   Period Weeks   Status On-going  2.69 ft/sec on 12/17/14   PT SHORT TERM GOAL #2   Title Pt will be independent with HEP to address balance, flexibility, and  gait. Target Date June 14th.   Time 4   Period Weeks   Status Achieved  Met 12/17/14   PT SHORT TERM GOAL #3   Title Improve DGI from 15/24 up to 19/24 to demo decreasing risk for falls.  Target date 12/22/2014   Time 4   Period Weeks   Status Achieved  19/24 on 12/15/14   PT SHORT TERM GOAL #4   Title Pt will negotiate stairs with single hand rail modified indep with reciprocal gait pattern on steps.  Target Date June 14th.    Baseline Met on 01/05/2015   Time 4   Period Weeks   Status Achieved   PT SHORT TERM GOAL #5   Title Demonstrate ability to ambulate 300' on level surfaces independently for decreased fall risk. Target date June 14th.    Time 4   Period Weeks   Status On-going  Pt ambulated x345' without AD but mod I  secondary to gait velocity.           PT Long Term Goals - 01/08/15 1848    PT LONG TERM GOAL #1   Title pt will verbalize understanding of fall prevention strategies in home environment. Target Date July 5th.    Baseline Met on 6/28 providing HEP.   Time 8   Period Weeks   Status Achieved   PT LONG TERM GOAL #2   Title pt will report no falls over the last 4 weeks to demonstrate improved safety with functional mobility. Target date July 5th.    Baseline Met on 01/05/15 with no falls reported.   Time 8   Period Weeks   Status Achieved   PT LONG TERM GOAL #3   Title Demonstrate ability to ambulate 500' on level surfaces independently for decreased fall risk.    Baseline Target date 01/23/2015   Time 4   Period Weeks   Status On-going   PT LONG TERM GOAL #4   Title The patient will improve gait speed from 2.53 ft/sec (on 6/28) to > or equal to 3.0 ft/sec to demo improving community mobility.   Baseline Target date 01/23/2015   Time 4   Period Weeks   Status On-going   PT LONG TERM GOAL #5   Title The patient will negotiate inclines/declines and curbs modified indep for improved community access.   Baseline Target date 01/23/2015   Time 4   Period Weeks   Status On-going               Plan - 01/21/15 1653    Clinical Impression Statement Pt required extensive tactile and verbal cues to perform floor transfers. Pt required more assist during last floor to standing transfer due to fatigue. Pt continues to experience difficulty shifting weight in anterior motion, even with extensive cues and demonstration. Continue with POC.   Pt will benefit from skilled therapeutic intervention in order to improve on the following deficits Abnormal gait;Decreased coordination;Difficulty walking;Impaired flexibility;Postural dysfunction;Decreased balance;Decreased knowledge of use of DME;Decreased mobility;Impaired perceived functional ability   Rehab Potential Good   Clinical Impairments  Affecting Rehab Potential pt reports her opthamologist told her she may have had recent TIA   PT Frequency 2x / week   PT Duration 8 weeks   PT Treatment/Interventions ADLs/Self Care Home Management;Therapeutic activities;Patient/family education;Visual/perceptual remediation/compensation;DME Instruction;Therapeutic exercise;Biofeedback;Gait training;Balance training;Electrical Stimulation;Functional mobility training;Neuromuscular re-education;Stair training   PT Next Visit Plan Begin to assess LTGs and possibly d/c.   Consulted and Agree with Plan of Care Patient  Problem List Patient Active Problem List   Diagnosis Date Noted  . IBS (irritable bowel syndrome) 12/01/2014  . Vertical diplopia 11/17/2014  . Non-melanoma skin cancer 05/13/2014  . Uncontrolled type 2 diabetes mellitus with nephropathy 04/22/2014  . Seasonal allergies 09/12/2011  . Anxiety 09/12/2011  . HTN (hypertension) 09/12/2011  . Breast cancer 09/12/2011  . GERD (gastroesophageal reflux disease) 09/12/2011  . Dyslipidemia 09/12/2011  . Osteopenia 09/12/2011  . DJD (degenerative joint disease) 09/12/2011  . Factor 5 Leiden mutation, heterozygous 09/12/2011  . Personal history of colonic polyps 09/12/2011    Emma-Lee Oddo L 01/21/2015, 4:55 PM  [facility] [address] [place], [place], [address] Phone: [phone]   Fax:  [phone]     Geoffry Paradise, PT,DPT 01/21/2015 4:55 PM Phone: [phone] Fax: [phone]

## 2015-01-22 ENCOUNTER — Ambulatory Visit: Payer: Medicare Other

## 2015-01-22 DIAGNOSIS — R269 Unspecified abnormalities of gait and mobility: Secondary | ICD-10-CM | POA: Diagnosis not present

## 2015-01-22 NOTE — Patient Instructions (Signed)
WALKING PROGRAM:  Begin by walking for 10 minutes a day, indoors. Increase walking time by 5 minutes each week as tolerated, until you reach 45 minutes to 1 hour. Please contact your doctor if you experience any shortness of breath, dizziness, or chest pain.  1st week: 10 minutes 2nd week: 15 minutes 3rd week: 20 minutes and so on

## 2015-01-22 NOTE — Therapy (Signed)
[facility] [address] [place], [place], [address] Phone: [phone]   Fax:  [phone]  Physical Therapy Treatment  Patient Details  Name: [name] [name] MRN: [id number] Date of Birth: Aug 15, 1940 Referring Provider:  Robyn Haber, MD  Encounter Date: 01/22/2015      PT End of Session - 01/22/15 1500    Visit Number 17   Number of Visits 17   Date for PT Re-Evaluation 01/24/15   Authorization Type Medicare - G Codes every 10 sessions   PT Start Time 1401   PT Stop Time 1447   PT Time Calculation (min) 46 min   Activity Tolerance Patient tolerated treatment well   Behavior During Therapy [facility] for tasks assessed/performed      Past Medical History  Diagnosis Date  . Hypertension   . Arthritis   . IBS (irritable bowel syndrome)   . Allergy   . Clotting disorder   . Diabetes mellitus   . Cancer     breast and skin    Past Surgical History  Procedure Laterality Date  . Mastectomy  2002    Bilateral  . Breast lumpectomy  1996  . Abdominal hysterectomy  1985    There were no vitals filed for this visit.  Visit Diagnosis:  Abnormality of gait      Subjective Assessment - 01/22/15 1405    Subjective Pt denied falls or changes since last visit. Pt reported she found a chair to place in her bedroom, since it is sturdy.    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 No/denies         Neuro re-ed: Pt performed and reviewed balance HEP. PT adjusted frequency based on pt's progress. -Feet together with eyes open/close, feet together with head turns.  -Tandem amb. With one UE support at counter.                [facility] Adult PT Treatment/Exercise - 01/22/15 1408    Ambulation/Gait   Ambulation/Gait Yes    Ambulation/Gait Assistance 7: Independent   Ambulation/Gait Assistance Details Pt ambulated over even and uneven terrain without LOB and demonstrated safe technique. Pt was noted to experience decreased B DF during last 200' of amb.    Ambulation Distance (Feet) --  117'x5, 800'   Assistive device None   Gait Pattern Right flexed knee in stance;Left flexed knee in stance;Decreased trunk rotation;Wide base of support;Decreased dorsiflexion - right;Decreased dorsiflexion - left   Ambulation Surface Level;Unlevel;Indoor;Outdoor;Paved   Gait velocity 2.62f/sec.   no AD   Ramp 6: Modified independent (Device)   Ramp Details (indicate cue type and reason) MOD I as she required increased time. No LOB    Curb 6: Modified independent (Device/increase time)                PT Education - 01/22/15 1459    Education provided Yes   Education Details Reviewed HEP and PT changed frequency based on pt's progress. Provided pt with walking program to build endurance. PT discussed d/c with pt, as she met goals. PT encouraged pt to continue HEP.   Person(s) Educated Patient   Methods Explanation;Verbal cues   Comprehension Verbalized understanding;Returned demonstration          PT Short Term Goals - 01/22/15 1501    PT SHORT  TERM GOAL #1   Title The patient will improve gait speed from 2.18 ft/sec up to 3 ft/sec to demo transition to "full community ambulator" (2.62 ft/sec is cut-off)  classification of gait. Target Date June 14th.    Baseline Gait speed on 6/28 is 2.53 and varies from session to session   Time 4   Period Weeks   Status On-going  2.69 ft/sec on 12/17/14   PT SHORT TERM GOAL #2   Title Pt will be independent with HEP to address balance, flexibility, and gait. Target Date June 14th.   Time 4   Period Weeks   Status Achieved  Met 12/17/14   PT SHORT TERM GOAL #3   Title Improve DGI from 15/24 up to 19/24 to demo decreasing risk for falls.  Target date 12/22/2014   Time 4    Period Weeks   Status Achieved  19/24 on 12/15/14   PT SHORT TERM GOAL #4   Title Pt will negotiate stairs with single hand rail modified indep with reciprocal gait pattern on steps.  Target Date June 14th.    Baseline Met on 01/05/2015   Time 4   Period Weeks   Status Achieved   PT SHORT TERM GOAL #5   Title Demonstrate ability to ambulate 300' on level surfaces independently for decreased fall risk. Target date June 14th.    Time 4   Period Weeks   Status Achieved  Pt ambulated x345' without AD but mod I secondary to gait velocity.           PT Long Term Goals - 02-14-15 1501    PT LONG TERM GOAL #1   Title pt will verbalize understanding of fall prevention strategies in home environment. Target Date July 5th.    Baseline Met on 6/28 providing HEP.   Time 8   Period Weeks   Status Achieved   PT LONG TERM GOAL #2   Title pt will report no falls over the last 4 weeks to demonstrate improved safety with functional mobility. Target date July 5th.    Baseline Met on 01/05/15 with no falls reported.   Time 8   Period Weeks   Status Achieved   PT LONG TERM GOAL #3   Title Demonstrate ability to ambulate 500' on level surfaces independently for decreased fall risk.    Baseline Target date 01/23/2015   Time 4   Period Weeks   Status Achieved   PT LONG TERM GOAL #4   Title The patient will improve gait speed from 2.53 ft/sec (on 6/28) to > or equal to 3.0 ft/sec to demo improving community mobility.   Baseline Target date 01/23/2015   Time 4   Period Weeks   Status Partially Met   PT LONG TERM GOAL #5   Title The patient will negotiate inclines/declines and curbs modified indep for improved community access.   Baseline Target date 01/23/2015   Time 4   Period Weeks   Status Achieved               Plan - 2015-02-14 1500    Clinical Impression Statement Pt met LTGs 1, 2, 3 and 5; pt partially met LTG 4. Pt is discharging from PT today, please see d/c summary for details.           G-Codes - 14-Feb-2015 1505    Functional Assessment Tool Used Gait speed no AD: 2.15f/sec.; tandem walking at counter with supervision and one UE support; feet together: 1  minute without LOB; DGI: 19/24   Functional Limitation Mobility: Walking and moving around   Mobility: Walking and Moving Around Goal Status [phone]) At least 1 percent but less than 20 percent impaired, limited or restricted   Mobility: Walking and Moving Around Discharge Status [phone]) At least 1 percent but less than 20 percent impaired, limited or restricted      Problem List Patient Active Problem List   Diagnosis Date Noted  . IBS (irritable bowel syndrome) 12/01/2014  . Vertical diplopia 11/17/2014  . Non-melanoma skin cancer 05/13/2014  . Uncontrolled type 2 diabetes mellitus with nephropathy 04/22/2014  . Seasonal allergies 09/12/2011  . Anxiety 09/12/2011  . HTN (hypertension) 09/12/2011  . Breast cancer 09/12/2011  . GERD (gastroesophageal reflux disease) 09/12/2011  . Dyslipidemia 09/12/2011  . Osteopenia 09/12/2011  . DJD (degenerative joint disease) 09/12/2011  . Factor 5 Leiden mutation, heterozygous 09/12/2011  . Personal history of colonic polyps 09/12/2011    Kaysie Michelini L 01/22/2015, 3:07 PM  [facility] [address] [place], [place], [address] Phone: [phone]   Fax:  [phone]   PHYSICAL THERAPY DISCHARGE SUMMARY  Visits from Start of Care: 17  Current functional level related to goals / functional outcomes:     PT Long Term Goals - 01/22/15 1501    PT LONG TERM GOAL #1   Title pt will verbalize understanding of fall prevention strategies in home environment. Target Date July 5th.    Baseline Met on 6/28 providing HEP.   Time 8   Period Weeks   Status Achieved   PT LONG TERM GOAL #2   Title pt will report no falls over the last 4 weeks to demonstrate improved safety with functional mobility.  Target date July 5th.    Baseline Met on 01/05/15 with no falls reported.   Time 8   Period Weeks   Status Achieved   PT LONG TERM GOAL #3   Title Demonstrate ability to ambulate 500' on level surfaces independently for decreased fall risk.    Baseline Target date 01/23/2015   Time 4   Period Weeks   Status Achieved   PT LONG TERM GOAL #4   Title The patient will improve gait speed from 2.53 ft/sec (on 6/28) to > or equal to 3.0 ft/sec to demo improving community mobility.   Baseline Target date 01/23/2015   Time 4   Period Weeks   Status Partially Met   PT LONG TERM GOAL #5   Title The patient will negotiate inclines/declines and curbs modified indep for improved community access.   Baseline Target date 01/23/2015   Time 4   Period Weeks   Status Achieved        Remaining deficits: Pt continues to require increase time when traversing ramps and curbs, pt also ambulates with decreased trunk rotation and wide BOS.   Education / Equipment: HEP  Plan: Patient agrees to discharge.  Patient goals were met. Patient is being discharged due to meeting the stated rehab goals.  ?????       Geoffry Paradise, PT,DPT 01/22/2015 3:07 PM Phone: [phone] Fax: [phone]

## 2015-01-27 ENCOUNTER — Ambulatory Visit: Payer: Medicare Other | Admitting: Rehabilitative and Restorative Service Providers"

## 2015-01-28 ENCOUNTER — Ambulatory Visit: Payer: Medicare Other | Admitting: Rehabilitative and Restorative Service Providers"

## 2015-01-28 NOTE — Telephone Encounter (Signed)
Called pt to see if she has gotten any further correspondence from ins about Xifaxin, and she has not, but stated they gave her a full month's worth of medication at last RF. Called and [facility] for Sonia Side from [facility] to call me w/status of appeal.

## 2015-02-03 ENCOUNTER — Ambulatory Visit: Payer: Medicare Other | Admitting: Rehabilitative and Restorative Service Providers"

## 2015-02-05 ENCOUNTER — Ambulatory Visit: Payer: Medicare Other | Admitting: Rehabilitative and Restorative Service Providers"

## 2015-02-17 ENCOUNTER — Telehealth: Payer: Self-pay

## 2015-02-17 DIAGNOSIS — E1142 Type 2 diabetes mellitus with diabetic polyneuropathy: Secondary | ICD-10-CM

## 2015-02-17 DIAGNOSIS — I10 Essential (primary) hypertension: Secondary | ICD-10-CM

## 2015-02-17 DIAGNOSIS — E785 Hyperlipidemia, unspecified: Secondary | ICD-10-CM

## 2015-02-17 NOTE — Telephone Encounter (Signed)
Pt states she is changing over to Dr Tamala Julian from Dr Synetta Shadow and would like to know if Dr Tamala Julian would put in the same orders Dr Synetta Shadow use to put in for her and she comes over a week ahead of time and Get her blood drawn. Please call [phone]

## 2015-02-23 NOTE — Telephone Encounter (Signed)
Call --- orders placed for patient to present for lab only visit prior to upcoming appointment.

## 2015-02-23 NOTE — Telephone Encounter (Signed)
Pt's husband notified.

## 2015-02-25 ENCOUNTER — Other Ambulatory Visit (INDEPENDENT_AMBULATORY_CARE_PROVIDER_SITE_OTHER): Payer: Medicare Other | Admitting: Family Medicine

## 2015-02-25 DIAGNOSIS — E785 Hyperlipidemia, unspecified: Secondary | ICD-10-CM

## 2015-02-25 DIAGNOSIS — I10 Essential (primary) hypertension: Secondary | ICD-10-CM

## 2015-02-25 DIAGNOSIS — E1142 Type 2 diabetes mellitus with diabetic polyneuropathy: Secondary | ICD-10-CM | POA: Diagnosis not present

## 2015-02-25 LAB — LIPID PANEL
Cholesterol: 166 mg/dL (ref 125–200)
HDL: 37 mg/dL — ABNORMAL LOW (ref >=46)
LDL Cholesterol: 81 mg/dL (ref <130)
Total CHOL/HDL Ratio: 4.5 Ratio (ref <=5.0)
Triglycerides: 240 mg/dL — ABNORMAL HIGH (ref <150)
VLDL: 48 mg/dL — ABNORMAL HIGH (ref <30)

## 2015-02-25 LAB — HEMOGLOBIN A1C
Hgb A1c MFr Bld: 6.2 % — ABNORMAL HIGH (ref <5.7)
Mean Plasma Glucose: 131 mg/dL — ABNORMAL HIGH (ref <117)

## 2015-02-25 LAB — CBC WITH DIFFERENTIAL/PLATELET
Basophils Absolute: 0 10*3/uL (ref 0.0–0.1)
Basophils Relative: 0 % (ref 0–1)
Eosinophils Absolute: 0.2 10*3/uL (ref 0.0–0.7)
Eosinophils Relative: 2 % (ref 0–5)
HCT: 43.4 % (ref 36.0–46.0)
Hemoglobin: 15.1 g/dL — ABNORMAL HIGH (ref 12.0–15.0)
Lymphocytes Relative: 24 % (ref 12–46)
Lymphs Abs: 2 10*3/uL (ref 0.7–4.0)
MCH: 28.4 pg (ref 26.0–34.0)
MCHC: 34.8 g/dL (ref 30.0–36.0)
MCV: 81.7 fL (ref 78.0–100.0)
MPV: 9.3 fL (ref 8.6–12.4)
Monocytes Absolute: 0.7 10*3/uL (ref 0.1–1.0)
Monocytes Relative: 8 % (ref 3–12)
Neutro Abs: 5.5 10*3/uL (ref 1.7–7.7)
Neutrophils Relative %: 66 % (ref 43–77)
Platelets: 328 10*3/uL (ref 150–400)
RBC: 5.31 MIL/uL — ABNORMAL HIGH (ref 3.87–5.11)
RDW: 13.5 % (ref 11.5–15.5)
WBC: 8.3 10*3/uL (ref 4.0–10.5)

## 2015-02-25 LAB — COMPREHENSIVE METABOLIC PANEL WITH GFR
ALT: 11 U/L (ref 6–29)
AST: 14 U/L (ref 10–35)
Albumin: 4.5 g/dL (ref 3.6–5.1)
Alkaline Phosphatase: 62 U/L (ref 33–130)
BUN: 22 mg/dL (ref 7–25)
CO2: 25 mmol/L (ref 20–31)
Calcium: 9.4 mg/dL (ref 8.6–10.4)
Chloride: 104 mmol/L (ref 98–110)
Creat: 1.12 mg/dL — ABNORMAL HIGH (ref 0.60–0.93)
Glucose, Bld: 127 mg/dL — ABNORMAL HIGH (ref 65–99)
Potassium: 3.8 mmol/L (ref 3.5–5.3)
Sodium: 140 mmol/L (ref 135–146)
Total Bilirubin: 0.7 mg/dL (ref 0.2–1.2)
Total Protein: 6.6 g/dL (ref 6.1–8.1)

## 2015-02-25 LAB — TSH: TSH: 2.939 u[IU]/mL (ref 0.350–4.500)

## 2015-02-26 ENCOUNTER — Other Ambulatory Visit: Payer: Self-pay | Admitting: Internal Medicine

## 2015-02-26 ENCOUNTER — Telehealth: Payer: Self-pay

## 2015-02-26 NOTE — Telephone Encounter (Signed)
Pt called and reported that she has started having a problem with occasional urinary incontinence. I asked her if she has other Sxs and listed possible Sxs of UTI. Pt advised that she sometimes has some dysuria. I discussed with Dr Tamala Julian who will be seeing pt for appt soon and advised pt to go ahead and come in tomorrow to make sure it is not an infection that could worsen if left untreated. Pt agreed.

## 2015-02-26 NOTE — Telephone Encounter (Signed)
Still have never heard decision on 2nd appeal and haven't received a CB from [employer] or Librarian, academic. I called Sonia Side back and reached him. He checked on status and could not see the 2nd letter of appeal that was faxed 6/21 w/confirmation. He wanted me to call member services to make sure that the appeal will go to the correct place and see if they can expedite it. I was transferred 5 times over the next 1/2 hour and finally was given the number to call directly and it was a fax #. I gave up and called Sonia Side back to see if he can just give me the correct fax # to re-send the 2nd appeal letter, but had to LM for him to CB.

## 2015-02-27 ENCOUNTER — Ambulatory Visit (INDEPENDENT_AMBULATORY_CARE_PROVIDER_SITE_OTHER): Payer: Medicare Other | Admitting: Family Medicine

## 2015-02-27 VITALS — BP 128/72 | HR 73 | Temp 98.0°F | Resp 16 | Ht 65.5 in | Wt 177.0 lb

## 2015-02-27 DIAGNOSIS — E1121 Type 2 diabetes mellitus with diabetic nephropathy: Secondary | ICD-10-CM | POA: Diagnosis not present

## 2015-02-27 DIAGNOSIS — R350 Frequency of micturition: Secondary | ICD-10-CM

## 2015-02-27 DIAGNOSIS — N3941 Urge incontinence: Secondary | ICD-10-CM | POA: Diagnosis not present

## 2015-02-27 LAB — POCT UA - MICROSCOPIC ONLY
Bacteria, U Microscopic: NEGATIVE
Casts, Ur, LPF, POC: NEGATIVE
Crystals, Ur, HPF, POC: NEGATIVE
Epithelial cells, urine per micros: NEGATIVE
Mucus, UA: NEGATIVE
Yeast, UA: NEGATIVE

## 2015-02-27 LAB — POCT URINALYSIS DIPSTICK
Bilirubin, UA: NEGATIVE
Blood, UA: NEGATIVE
Glucose, UA: 500
Ketones, UA: NEGATIVE
Leukocytes, UA: NEGATIVE
Nitrite, UA: NEGATIVE
Protein, UA: NEGATIVE
Spec Grav, UA: 1.01
Urobilinogen, UA: 0.2
pH, UA: 5

## 2015-02-27 LAB — MICROALBUMIN, URINE: Microalb, Ur: 0.2 mg/dL (ref <2.0)

## 2015-02-27 MED ORDER — OXYBUTYNIN CHLORIDE ER 5 MG PO TB24: 5.0000 mg | ORAL_TABLET | Freq: Every day | ORAL | Status: DC

## 2015-02-27 NOTE — Progress Notes (Signed)
Subjective:    Patient ID: [name] [name], female    DOB: 07-24-1940, 74 y.o.   MRN: [id number]  02/27/2015  Urinary Frequency   HPI This 74 y.o. female presents for evaluation of urinary frequency.  Onset one month ago.  Never at night; but there are times when getting ready to go to bathroom with incontinence on the way to the bathroom. No fever/chills/sweats; no malaise.  No n/v; +intermittent diarrhea.  No diarrhea.  Known IBS with diarrhea only.  No dysuria, hematuria, frequency.  Nocturia x 1 which is baseline.  No medications.  No frequent UTIs.  Sugars are running unknown.  Recent HgbA1c of 6.2 this week.  Sugars range from 68-150s.  Also due for urine microalbumin.  Traveling to [place] to see children and grandchildren in five days.   Review of Systems  Constitutional: Negative for fever, chills, diaphoresis and fatigue.  Gastrointestinal: Negative for nausea, abdominal pain and diarrhea.  Genitourinary: Positive for urgency and frequency. Negative for dysuria, hematuria, flank pain, vaginal bleeding, vaginal discharge, genital sores, vaginal pain and pelvic pain.  Skin: Negative for rash.    Past Medical History  Diagnosis Date  . Hypertension   . Arthritis   . IBS (irritable bowel syndrome)   . Allergy   . Clotting disorder   . Diabetes mellitus   . Cancer     breast and skin   Past Surgical History  Procedure Laterality Date  . Mastectomy  2002    Bilateral  . Breast lumpectomy  1996  . Abdominal hysterectomy  1985   Allergies  Allergen Reactions  . Fruit & Vegetable Daily [Nutritional Supplements] Diarrhea  . Onion Other (See Comments)    indigestion  . Tree Extract Swelling  . Sulfa Drugs Cross Reactors Rash    All over the body   Current Outpatient Prescriptions  Medication Sig Dispense Refill  . aspirin 81 MG tablet Take 81 mg by mouth daily.    Marland Kitchen azelastine (ASTELIN) 0.1 % nasal spray Place 1 spray into both nostrils 2 (two) times daily as  needed for rhinitis or allergies.     . Blood Glucose Monitoring Suppl (ONE TOUCH ULTRA 2) W/DEVICE KIT Inject 1 application as directed as needed.    . busPIRone (BUSPAR) 30 MG tablet TAKE 1 TABLET TWICE DAILY. 180 tablet 0  . canagliflozin (INVOKANA) 100 MG TABS tablet Take 1 tablet (100 mg total) by mouth daily. 90 tablet 3  . cholecalciferol (VITAMIN D) 1000 UNITS tablet Take 2,000 Units by mouth daily.    . diazepam (VALIUM) 5 MG tablet TAKE 1/2 TABLET AT BEDTIME AS NEEDED. 30 tablet 3  . ECHINACEA EXTRACT PO Take 1 capsule by mouth daily as needed.    Marland Kitchen EPIPEN 2-PAK 0.3 MG/0.3ML SOAJ injection Inject 0.3 mLs as directed as needed. For allergic reaction    . fluticasone (FLONASE) 50 MCG/ACT nasal spray Place 2 sprays into the nose daily. 16 g 11  . furosemide (LASIX) 20 MG tablet TAKE 1 OR 2 TABLETS DAILY AS DIRECTED. 180 tablet 0  . glipiZIDE (GLIPIZIDE XL) 5 MG 24 hr tablet Take 1 tablet (5 mg total) by mouth daily with breakfast. 30 tablet 5  . glucose blood (ONETOUCH VERIO) test strip Use to test blood sugar 2 times daily as instructed. Dx: E11.29 100 each 3  . metFORMIN (GLUCOPHAGE) 500 MG tablet TAKE 2 TABLETS TWICE DAILY WITH MEALS. 120 tablet 0  . Misc Natural Products (TART CHERRY ADVANCED PO) Take  1 capsule by mouth daily.    . montelukast (SINGULAIR) 10 MG tablet TAKE ONE TABLET AT BEDTIME. 90 tablet 0  . ONETOUCH DELICA LANCETS 65V MISC Use to test blood sugar 2 times daily as instructed. Dx: E11.29 100 each 3  . TOPROL XL 100 MG 24 hr tablet TAKE 1 TABLET TWICE DAILY WITH FOOD. 180 tablet 0  . conjugated estrogens (PREMARIN) vaginal cream Place 1 Applicatorful vaginally daily. (Patient not taking: Reported on 12/14/2014) 42.5 g 12  . desloratadine (CLARINEX) 5 MG tablet Take 5 mg by mouth daily.    Marland Kitchen ketotifen (ZADITOR) 0.025 % ophthalmic solution Place 1 drop into both eyes 2 (two) times daily as needed (allergies).    . nystatin (MYCOSTATIN/NYSTOP) 100000 UNIT/GM POWD Apply 1 g  topically 2 (two) times daily. (Patient not taking: Reported on 12/14/2014) 60 g 3  . oxybutynin (DITROPAN-XL) 5 MG 24 hr tablet Take 1 tablet (5 mg total) by mouth at bedtime. 30 tablet 0  . rifaximin (XIFAXAN) 550 MG TABS tablet Take 1 tablet (550 mg total) by mouth 2 (two) times daily. (Patient not taking: Reported on 12/14/2014) 60 tablet 11   No current facility-administered medications for this visit.   Social History   Social History  . Marital Status: Married    Spouse Name: N/A  . Number of Children: N/A  . Years of Education: N/A   Occupational History  . Not on file.   Social History Main Topics  . Smoking status: Never Smoker   . Smokeless tobacco: Never Used  . Alcohol Use: 0.0 oz/week    0 Standard drinks or equivalent per week     Comment: social  . Drug Use: No  . Sexual Activity: No   Other Topics Concern  . Not on file   Social History Narrative       Objective:    BP 128/72 mmHg  Pulse 73  Temp(Src) 98 F (36.7 C) (Oral)  Resp 16  Ht 5' 5.5" (1.664 m)  Wt 177 lb (80.287 kg)  BMI 29.00 kg/m2  SpO2 97% Physical Exam  Constitutional: She is oriented to person, place, and time. She appears well-developed and well-nourished. No distress.  HENT:  Head: Normocephalic and atraumatic.  Eyes: Conjunctivae are normal. Pupils are equal, round, and reactive to light.  Neck: Normal range of motion. Neck supple.  Cardiovascular: Normal rate, regular rhythm and normal heart sounds.  Exam reveals no gallop and no friction rub.   No murmur heard. Pulmonary/Chest: Effort normal and breath sounds normal. She has no wheezes. She has no rales.  Abdominal: Soft. Bowel sounds are normal. She exhibits no distension and no mass. There is no tenderness. There is no rebound and no guarding.  Neurological: She is alert and oriented to person, place, and time.  Skin: She is not diaphoretic.  Psychiatric: She has a normal mood and affect. Her behavior is normal.  Nursing note  and vitals reviewed.  Results for orders placed or performed in visit on 02/27/15  POCT urinalysis dipstick  Result Value Ref Range   Color, UA Light Yellow    Clarity, UA Clear    Glucose, UA 500    Bilirubin, UA Negative    Ketones, UA Negative    Spec Grav, UA 1.010    Blood, UA Negative    pH, UA 5.0    Protein, UA Negative    Urobilinogen, UA 0.2    Nitrite, UA Negative    Leukocytes, UA Negative  Negative  POCT UA - Microscopic Only  Result Value Ref Range   WBC, Ur, HPF, POC 1-3    RBC, urine, microscopic 0-2    Bacteria, U Microscopic Negative    Mucus, UA Negative    Epithelial cells, urine per micros Negative    Crystals, Ur, HPF, POC Negative    Casts, Ur, LPF, POC Negative    Yeast, UA Negative        Assessment & Plan:   1. Urinary frequency   2. Type 2 diabetes mellitus with diabetic nephropathy   3. Urge incontinence    1. Urine frequency: New onset in past month; send urine culture to rule out underlying infection. 2.  DMII with nephropathy: well controlled at this time.  Not contributing to current urinary symptoms. 3.  Urge Incontinence:  New. Onset in past month; send urine culture. Recommend 6-12 week trial of Kiegel exercises, caffeine and alcohol avoidance, weight loss. No underlying constipation contributing.  Due to upcoming trip, pt desires trial of medication; provided with Ditropan XL 20m daily to start today.  Discussed potential side effects of dry mouth and constipation.  Consider formal physical therapy if no improvement with Kiegel exercises.  Invokana can cause polyuria; clarify how long pt has been taking; upon review of chart, has been on Invokana since atleast 05/2014.   Meds ordered this encounter  Medications  . oxybutynin (DITROPAN-XL) 5 MG 24 hr tablet    Sig: Take 1 tablet (5 mg total) by mouth at bedtime.    Dispense:  30 tablet    Refill:  0    No Follow-up on file.   Kristi MElayne Guerin M.D. [facility]  [phone]

## 2015-02-27 NOTE — Patient Instructions (Signed)
Urinary Incontinence Urinary incontinence is the involuntary loss of urine from your bladder. CAUSES  There are many causes of urinary incontinence. They include:  Medicines.  Infections.  Prostatic enlargement, leading to overflow of urine from your bladder.  Surgery.  Neurological diseases.  Emotional factors. SIGNS AND SYMPTOMS Urinary Incontinence can be divided into four types: 1. Urge incontinence. Urge incontinence is the involuntary loss of urine before you have the opportunity to go to the bathroom. There is a sudden urge to void but not enough time to reach a bathroom. 2. Stress incontinence. Stress incontinence is the sudden loss of urine with any activity that forces urine to pass. It is commonly caused by anatomical changes to the pelvis and sphincter areas of your body. 3. Overflow incontinence. Overflow incontinence is the loss of urine from an obstructed opening to your bladder. This results in a backup of urine and a resultant buildup of pressure within the bladder. When the pressure within the bladder exceeds the closing pressure of the sphincter, the urine overflows, which causes incontinence, similar to water overflowing a dam. 4. Total incontinence. Total incontinence is the loss of urine as a result of the inability to store urine within your bladder. DIAGNOSIS  Evaluating the cause of incontinence may require:  A thorough and complete medical and obstetric history.  A complete physical exam.  Laboratory tests such as a urine culture and sensitivities. When additional tests are indicated, they can include:  An ultrasound exam.  Kidney and bladder X-rays.  Cystoscopy. This is an exam of the bladder using a narrow scope.  Urodynamic testing to test the nerve function to the bladder and sphincter areas. TREATMENT  Treatment for urinary incontinence depends on the cause:  For urge incontinence caused by a bacterial infection, antibiotics will be prescribed.  If the urge incontinence is related to medicines you take, your health care provider may have you change the medicine.  For stress incontinence, surgery to re-establish anatomical support to the bladder or sphincter, or both, will often correct the condition.  For overflow incontinence caused by an enlarged prostate, an operation to open the channel through the enlarged prostate will allow the flow of urine out of the bladder. In women with fibroids, a hysterectomy may be recommended.  For total incontinence, surgery on your urinary sphincter may help. An artificial urinary sphincter (an inflatable cuff placed around the urethra) may be required. In women who have developed a hole-like passage between their bladder and vagina (vesicovaginal fistula), surgery to close the fistula often is required. HOME CARE INSTRUCTIONS  Normal daily hygiene and the use of pads or adult diapers that are changed regularly will help prevent odors and skin damage.  Avoid caffeine. It can overstimulate your bladder.  Use the bathroom regularly. Try about every 2-3 hours to go to the bathroom, even if you do not feel the need to do so. Take time to empty your bladder completely. After urinating, wait a minute. Then try to urinate again.  For causes involving nerve dysfunction, keep a log of the medicines you take and a journal of the times you go to the bathroom. SEEK MEDICAL CARE IF:  You experience worsening of pain instead of improvement in pain after your procedure.  Your incontinence becomes worse instead of better. SEE IMMEDIATE MEDICAL CARE IF:  You experience fever or shaking chills.  You are unable to pass your urine.  You have redness spreading into your groin or down into your thighs. MAKE SURE   YOU:   Understand these instructions.   Will watch your condition.  Will get help right away if you are not doing well or get worse. Document Released: 08/03/2004 Document Revised: 04/16/2013 Document  Reviewed: 12/03/2012 ExitCare Patient Information 2015 [employer]. This information is not intended to replace advice given to you by your health care provider. Make sure you discuss any questions you have with your health care provider.  

## 2015-03-01 LAB — URINE CULTURE: Colony Count: 50000

## 2015-03-22 ENCOUNTER — Encounter: Payer: Self-pay | Admitting: Family Medicine

## 2015-03-22 ENCOUNTER — Ambulatory Visit (INDEPENDENT_AMBULATORY_CARE_PROVIDER_SITE_OTHER): Payer: Medicare Other | Admitting: Family Medicine

## 2015-03-22 VITALS — BP 117/66 | HR 85 | Temp 98.0°F | Resp 16 | Wt 176.0 lb

## 2015-03-22 DIAGNOSIS — Z853 Personal history of malignant neoplasm of breast: Secondary | ICD-10-CM | POA: Diagnosis not present

## 2015-03-22 DIAGNOSIS — I10 Essential (primary) hypertension: Secondary | ICD-10-CM | POA: Diagnosis not present

## 2015-03-22 DIAGNOSIS — M47816 Spondylosis without myelopathy or radiculopathy, lumbar region: Secondary | ICD-10-CM

## 2015-03-22 DIAGNOSIS — N189 Chronic kidney disease, unspecified: Secondary | ICD-10-CM | POA: Diagnosis not present

## 2015-03-22 DIAGNOSIS — E1122 Type 2 diabetes mellitus with diabetic chronic kidney disease: Secondary | ICD-10-CM | POA: Diagnosis not present

## 2015-03-22 DIAGNOSIS — E785 Hyperlipidemia, unspecified: Secondary | ICD-10-CM

## 2015-03-22 DIAGNOSIS — Z23 Encounter for immunization: Secondary | ICD-10-CM

## 2015-03-22 DIAGNOSIS — F411 Generalized anxiety disorder: Secondary | ICD-10-CM | POA: Diagnosis not present

## 2015-03-22 DIAGNOSIS — D6851 Activated protein C resistance: Secondary | ICD-10-CM

## 2015-03-22 DIAGNOSIS — N3941 Urge incontinence: Secondary | ICD-10-CM | POA: Diagnosis not present

## 2015-03-22 DIAGNOSIS — K219 Gastro-esophageal reflux disease without esophagitis: Secondary | ICD-10-CM

## 2015-03-22 DIAGNOSIS — D688 Other specified coagulation defects: Secondary | ICD-10-CM | POA: Diagnosis not present

## 2015-03-22 NOTE — Progress Notes (Signed)
Subjective:    Patient ID: [name] [name], female    DOB: 21-Jul-1940, 74 y.o.   MRN: [id number]  03/22/2015  Diabetes; Hyperlipidemia; Hypertension; and Anxiety   HPI This 74 y.o. female presents to establish care and for three month follow-up:  1. DMII: Patient reports good compliance with medication, good tolerance to medication, and good symptom control.  Managed by Dr. Cruzita Lederer of endocrinology.  Last visit 12/2014.  Invokana started 04/2014; Metformin 574m tid, Glipizide XL 532mdaily.   Lab Results  Component Value Date   HGBA1C 6.2* 02/25/2015   2.  Allergic Rhinitis: Patient reports good compliance with medication, good tolerance to medication, and good symptom control.  Maintained on Astelin and Clarinex and Flonase and Singulair.  +allergy shots; followed by [facility]   3.  Insomnia: prescribed Valium 2m29m/2 qhs PRN.  Last rx 09/25/14. Takes every night for insomnia with good results.  4. Venous stasis: maintained on Lasix 31m79me to two daily PRN. Takes every day.    5. OAB: prescribed Ditropan XL 2mg 19mly/bedtime. Urine cx negative at visit in 02/2015. Medication works very well for patient; has decreased to 1/2 tablet daily with good results; willing to undergo PT for overactive bladder.   6. HTN: Patient reports good compliance with medication, good tolerance to medication, and good symptom control.  Rx for Metoprolol XL two daily.  Chronic palpitations that are well controlled.   7. IBS diarrhea: Rifaximin 550mg 11m not taking in 12/2014. Takes PRN. Insurance does not like to pay for it.    8. Abnormality of gait: s/p neurology evaluation by Dr. Jaffe Tomi Likens2016; s/p rehabilitation.  9. Health maintenance:  Last physical: 2016 Pap smear:  Ages ago; aged out. Mammogram:  S/p B mastectomy for recurrent L breast cancer Colonoscopy:  Less than 10 years ago; [facility];  Bone density:  11/2013 [facility] TDAP:  2011 Pneumovax:  2012; Prevnar 2016 Zostavax:  2008 with Dr.  TurnbaElita Booneluenza:  Today. Eye exam:  2016; +glasses. Dental exam:  This month.  10. Anxiety: Patient reports good compliance with medication, good tolerance to medication, and good symptom control.  Maintained on Buspar 30mg b61mor years; no recent issues; emotionally stable. No SI/HI.   Review of Systems  Constitutional: Negative for fever, chills, diaphoresis and fatigue.  Eyes: Negative for visual disturbance.  Respiratory: Negative for cough and shortness of breath.   Cardiovascular: Negative for chest pain, palpitations and leg swelling.  Gastrointestinal: Negative for nausea, vomiting, abdominal pain, diarrhea and constipation.  Endocrine: Negative for cold intolerance, heat intolerance, polydipsia, polyphagia and polyuria.  Neurological: Negative for dizziness, tremors, seizures, syncope, facial asymmetry, speech difficulty, weakness, light-headedness, numbness and headaches.    Past Medical History  Diagnosis Date  . Hypertension   . IBS (irritable bowel syndrome)     diarrhea predominant.  . Diabetes mellitus   . Allergy     [facility]edgegy shots weekly.    . Arthritis     DDD lumbar spine.    . 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 Anxiety    Past Surgical History  Procedure Laterality Date  . Mastectomy  2002    Bilateral for breast cancer  . Breast lumpectomy  1996    L breast cancer  . Abdominal hysterectomy  1985    DUB; uterine  fibroids; ovaries intact.   Allergies  Allergen Reactions  . Fruit & Vegetable Daily [Nutritional Supplements] Diarrhea  . Onion Other (See Comments)    indigestion  . Tree Extract Swelling  . Sulfa Drugs Cross Reactors Rash    All over the body   Current Outpatient Prescriptions  Medication Sig Dispense Refill  . aspirin 81 MG tablet Take 81 mg by mouth daily.    Marland Kitchen azelastine (ASTELIN) 0.1 % nasal spray Place 1 spray  into both nostrils 2 (two) times daily as needed for rhinitis or allergies.     . Blood Glucose Monitoring Suppl (ONE TOUCH ULTRA 2) W/DEVICE KIT Inject 1 application as directed as needed.    . busPIRone (BUSPAR) 30 MG tablet TAKE 1 TABLET TWICE DAILY. 180 tablet 0  . canagliflozin (INVOKANA) 100 MG TABS tablet Take 1 tablet (100 mg total) by mouth daily. 90 tablet 3  . cholecalciferol (VITAMIN D) 1000 UNITS tablet Take 2,000 Units by mouth daily.    Marland Kitchen conjugated estrogens (PREMARIN) vaginal cream Place 1 Applicatorful vaginally daily. 42.5 g 12  . desloratadine (CLARINEX) 5 MG tablet Take 5 mg by mouth daily.    . diazepam (VALIUM) 5 MG tablet TAKE 1/2 TABLET AT BEDTIME AS NEEDED. 30 tablet 3  . ECHINACEA EXTRACT PO Take 1 capsule by mouth daily as needed.    Marland Kitchen EPIPEN 2-PAK 0.3 MG/0.3ML SOAJ injection Inject 0.3 mLs as directed as needed. For allergic reaction    . fluticasone (FLONASE) 50 MCG/ACT nasal spray Place 2 sprays into the nose daily. 16 g 11  . furosemide (LASIX) 20 MG tablet TAKE 1 OR 2 TABLETS DAILY AS DIRECTED. 180 tablet 0  . glipiZIDE (GLIPIZIDE XL) 5 MG 24 hr tablet Take 1 tablet (5 mg total) by mouth daily with breakfast. 30 tablet 5  . glucose blood (ONETOUCH VERIO) test strip Use to test blood sugar 2 times daily as instructed. Dx: E11.29 100 each 3  . ketotifen (ZADITOR) 0.025 % ophthalmic solution Place 1 drop into both eyes 2 (two) times daily as needed (allergies).    . metFORMIN (GLUCOPHAGE) 500 MG tablet TAKE 2 TABLETS TWICE DAILY WITH MEALS. 120 tablet 0  . Misc Natural Products (TART CHERRY ADVANCED PO) Take 1 capsule by mouth daily.    . montelukast (SINGULAIR) 10 MG tablet TAKE ONE TABLET AT BEDTIME. 90 tablet 0  . nystatin (MYCOSTATIN/NYSTOP) 100000 UNIT/GM POWD Apply 1 g topically 2 (two) times daily. 60 g 3  . ONETOUCH DELICA LANCETS 37D MISC Use to test blood sugar 2 times daily as instructed. Dx: E11.29 100 each 3  . oxybutynin (DITROPAN-XL) 5 MG 24 hr tablet  Take 1 tablet (5 mg total) by mouth at bedtime. 30 tablet 0  . rifaximin (XIFAXAN) 550 MG TABS tablet Take 1 tablet (550 mg total) by mouth 2 (two) times daily. 60 tablet 11  . TOPROL XL 100 MG 24 hr tablet TAKE 1 TABLET TWICE DAILY WITH FOOD. 180 tablet 0   No current facility-administered medications for this visit.   Social History   Social History  . Marital Status: Married    Spouse Name: N/A  . Number of Children: N/A  . Years of Education: N/A   Occupational History  . Not on file.   Social History Main Topics  . Smoking status: Never Smoker   . Smokeless tobacco: Never Used  . Alcohol Use: 0.0 oz/week    0 Standard drinks or equivalent per week  Comment: social  . Drug Use: No  . Sexual Activity: No   Other Topics Concern  . Not on file   Social History Narrative   Marital status: married x 16 years      Children:  2 children; 3 grandchildren      Lives: with husband      Employment: retired      Tobacco: never      Alcohol: rarely      Exercise:  [employer], [employer]; Tai Chi once per week         Family History  Problem Relation Age of Onset  . Heart disease Mother   . Heart disease Father   . Heart disease Brother        Objective:    BP 117/66 mmHg  Pulse 85  Temp(Src) 98 F (36.7 C) (Oral)  Resp 16  Wt 176 lb (79.833 kg) Physical Exam  Constitutional: She is oriented to person, place, and time. She appears well-developed and well-nourished. No distress.  HENT:  Head: Normocephalic and atraumatic.  Right Ear: External ear normal.  Left Ear: External ear normal.  Nose: Nose normal.  Mouth/Throat: Oropharynx is clear and moist.  Eyes: Conjunctivae and EOM are normal. Pupils are equal, round, and reactive to light.  Neck: Normal range of motion. Neck supple. Carotid bruit is not present. No thyromegaly present.  Cardiovascular: Normal rate, regular rhythm, normal heart sounds and intact distal pulses.  Exam reveals no gallop and no friction rub.    No murmur heard. Pulmonary/Chest: Effort normal and breath sounds normal. She has no wheezes. She has no rales.  Abdominal: Soft. Bowel sounds are normal. She exhibits no distension and no mass. There is no tenderness. There is no rebound and no guarding.  Lymphadenopathy:    She has no cervical adenopathy.  Neurological: She is alert and oriented to person, place, and time. No cranial nerve deficit.  Skin: Skin is warm and dry. No rash noted. She is not diaphoretic. No erythema. No pallor.  Psychiatric: She has a normal mood and affect. Her behavior is normal.   Results for orders placed or performed in visit on 02/27/15  Urine culture  Result Value Ref Range   Colony Count 50,000 COLONIES/ML    Organism ID, Bacteria Multiple bacterial morphotypes present, none    Organism ID, Bacteria predominant. Suggest appropriate recollection if     Organism ID, Bacteria clinically indicated.   Microalbumin, urine  Result Value Ref Range   Microalb, Ur 0.2 <2.0 mg/dL  POCT urinalysis dipstick  Result Value Ref Range   Color, UA Light Yellow    Clarity, UA Clear    Glucose, UA 500    Bilirubin, UA Negative    Ketones, UA Negative    Spec Grav, UA 1.010    Blood, UA Negative    pH, UA 5.0    Protein, UA Negative    Urobilinogen, UA 0.2    Nitrite, UA Negative    Leukocytes, UA Negative Negative  POCT UA - Microscopic Only  Result Value Ref Range   WBC, Ur, HPF, POC 1-3    RBC, urine, microscopic 0-2    Bacteria, U Microscopic Negative    Mucus, UA Negative    Epithelial cells, urine per micros Negative    Crystals, Ur, HPF, POC Negative    Casts, Ur, LPF, POC Negative    Yeast, UA Negative    INFLUENZA VACCINE ADMINISTERED.    Assessment & Plan:   1.  Urge incontinence   2. Generalized anxiety disorder   3. Essential hypertension   4. Need for prophylactic vaccination and inoculation against influenza   5. Gastroesophageal reflux disease without esophagitis   6. Factor 5  Leiden mutation, heterozygous   7. Dyslipidemia   8. Spondylosis of lumbar region without myelopathy or radiculopathy   9. Type 2 diabetes mellitus with diabetic chronic kidney disease     1. Urge Incontinence: improved with Ditropan XL 4m daily. Refer for PT. 2.  Generalized anxiety disorder: controlled; with Buspar; continue at this time. 3.  HTN: controlled; continue Metoprolol; question why no ACE I with DMII and renal insufficiency; will need to add ACE. 4.  GERD: stable; no medication at this time. 5.  Factor 5 Leiden mutation: stable; no history of clotting.   6.  Dyslipidemia: stable; obtain FLP at next visit. 7.  DMII: controlled; followed by endocrinology; consider stopping Invokana with recent urinary incontinence issues. 8. S/p flu vaccine.  Orders Placed This Encounter  Procedures  . Flu Vaccine QUAD 36+ mos IM  . Ambulatory referral to Physical Therapy    Referral Priority:  Routine    Referral Type:  Physical Medicine    Referral Reason:  Specialty Services Required    Requested Specialty:  Physical Therapy    Number of Visits Requested:  1   No orders of the defined types were placed in this encounter.    Return in about 6 months (around 09/19/2015) for complete physical examiniation.    Brennden Masten MElayne Guerin M.D. [facility] [place]  [phone]

## 2015-03-23 ENCOUNTER — Encounter: Payer: Self-pay | Admitting: Family Medicine

## 2015-03-23 ENCOUNTER — Telehealth: Payer: Self-pay | Admitting: Family Medicine

## 2015-03-23 NOTE — Telephone Encounter (Signed)
Called pharm to see if pt has been able to get this filled again and they stated she has not RFd it again since July. She tried to run it through again and it gave PA notice.

## 2015-03-23 NOTE — Telephone Encounter (Signed)
Please call Dr. Lorie Apley office for copy of pt's most recent colonoscopy or look in her paper chart for a colonoscopy report in the past ten years.  Thanks!

## 2015-03-25 NOTE — Telephone Encounter (Signed)
Pulled paper chart [id number]. Patient had colonoscopy done 04/14/11. Placed in Dr. Thompson Caul box for review.

## 2015-03-27 ENCOUNTER — Other Ambulatory Visit: Payer: Self-pay | Admitting: Internal Medicine

## 2015-03-27 ENCOUNTER — Other Ambulatory Visit: Payer: Self-pay | Admitting: Family Medicine

## 2015-03-29 ENCOUNTER — Ambulatory Visit (INDEPENDENT_AMBULATORY_CARE_PROVIDER_SITE_OTHER): Payer: Medicare Other | Admitting: Internal Medicine

## 2015-03-29 ENCOUNTER — Encounter: Payer: Self-pay | Admitting: Internal Medicine

## 2015-03-29 VITALS — BP 116/66 | HR 76 | Temp 97.9°F | Resp 12 | Wt 177.0 lb

## 2015-03-29 DIAGNOSIS — IMO0002 Reserved for concepts with insufficient information to code with codable children: Secondary | ICD-10-CM

## 2015-03-29 DIAGNOSIS — E1129 Type 2 diabetes mellitus with other diabetic kidney complication: Secondary | ICD-10-CM

## 2015-03-29 DIAGNOSIS — E1121 Type 2 diabetes mellitus with diabetic nephropathy: Secondary | ICD-10-CM

## 2015-03-29 DIAGNOSIS — E1165 Type 2 diabetes mellitus with hyperglycemia: Secondary | ICD-10-CM

## 2015-03-29 MED ORDER — GLIPIZIDE ER 5 MG PO TB24: 5.0000 mg | ORAL_TABLET | Freq: Every day | ORAL | Status: DC

## 2015-03-29 MED ORDER — METFORMIN HCL 500 MG PO TABS: ORAL_TABLET | ORAL | Status: DC

## 2015-03-29 NOTE — Progress Notes (Signed)
Patient ID: [name] [name], female   DOB: [birth year], 74 y.o.   MRN: [id number]  HPI: [name] [name] is a 74 y.o.-year-old female, returning for follow-up for DM2, dx 2014, non-insulin-dependent, uncontrolled, with complications (mild CKD). Last visit 3 month ago.  Last hemoglobin A1c was: Lab Results  Component Value Date   HGBA1C 6.2* 02/25/2015   HGBA1C 6.1* 10/15/2014   HGBA1C 6.8* 07/15/2014   Pt is on a regimen of: - Invokana 100 mg in am - started 04/2014 - metformin 500 mg tid - started 04/2014  - Glipizide XL 5 mg daily, before breakfast  Pt checks her sugars 0-1x a day: - A.m.: 469-629 (later higher after reducing metformin) >> 107-154 >> 102-152 - 2 hours post breakfast: 264 >> n/c  - Before lunch: n/c >> 95-144 >> 126-133 - 2 hours post lunch: n/c >> 85-111 >> 113-116, 140 - before dinner: n/c >> 82-125 >> 69 x2, 96-113, 189 - 2 hours post dinner: n/c  >> 101, 103 - Bedtime: n/c >> 84-121 >> n/c  Pt's meals are: - Breakfast: apple + cottage cheese + whole wheat - Lunch: leftovers from dinner; sandwich; salad - Dinner: varies; some fast food - Snacks: veggies; chips Some diet sodas.  She exercises [facility] [employer] [facility] [facility] [employer] [facility], Tai Chi 1x a week.  - + CKD, last BUN/creatinine:  Lab Results  Component Value Date   BUN 22 02/25/2015   CREATININE 1.12* 02/25/2015  Not on an ACEI. - last set of lipids: Lab Results  Component Value Date   CHOL 166 02/25/2015   HDL 37* 02/25/2015   LDLCALC 81 02/25/2015   TRIG 240* 02/25/2015   CHOLHDL 4.5 02/25/2015  Not on a statin. - last eye exam was in 11/03/2014. No DR. She had cataract sx.  - + numbness and tingling in her feet. She tells me she had PN long before she was dx with DM. She used Lidocaine patches >> discontinued. We started PN cream , but this was expensive (93$) and did not work much >> Voltaren gel works a little better.  I reviewed pt's medications, allergies, PMH, social hx, family hx,  and changes were documented in the history of present illness. Otherwise, unchanged from my initial visit note.  ROS: Constitutional: no weight gain/loss, no fatigue, no subjective hyperthermia/hypothermia Eyes: no blurry vision, no xerophthalmia ENT: no sore throat, no nodules palpated in throat, no dysphagia/odynophagia, no hoarseness Cardiovascular: no CP/SOB/palpitations/leg swelling Respiratory: no cough/SOB Gastrointestinal: no N/ V/D/C/heartburn, + IBS sxs Musculoskeletal: no muscle/joint aches Skin: no rashes, no hair loss Neurological: no tremors/numbness/itching  PE: BP 116/66 mmHg  Pulse 76  Temp(Src) 97.9 F (36.6 C) (Oral)  Resp 12  Wt 177 lb (80.287 kg)  SpO2 93% Body mass index is 29 kg/(m^2). Wt Readings from Last 3 Encounters:  03/29/15 177 lb (80.287 kg)  03/22/15 176 lb (79.833 kg)  02/27/15 177 lb (80.287 kg)   Constitutional: overweight, in NAD Eyes: PERRLA, EOMI, no exophthalmos ENT: moist mucous membranes, no thyromegaly, no cervical lymphadenopathy Cardiovascular: RRR, No MRG Respiratory: CTA B Gastrointestinal: abdomen soft, NT, ND, BS+ Musculoskeletal: no deformities, strength intact in all 4 Skin: moist, warm, no rashes Neurological: + tremor with outstretched hands, DTR normal in all 4  ASSESSMENT: 1. DM2, non-insulin-dependent, uncontrolled, with complications - mild CKD  PLAN:  1. Patient with now better controlled diabetes, on oral antidiabetic regimen with Invokana, Metformin, and Glipizide  >> sugars greatly improved! - per  log and per last HbA1c (6.2%).  Patient Instructions  Continue: - Invokana 100 mg in am - metformin 500 mg 3x a day with meals - Glipizide XL 5 mg daily in am  Please return in 3 months with your sugar log.   - I advised her that she may be able to stop Lasix while on Invokana  - continue checking sugars at different times of the day - check 1-2 times a day, rotating checks - advised for yearly eye exams - she is  up to date - Return to clinic in 3 mo with sugar log

## 2015-03-29 NOTE — Patient Instructions (Addendum)
Continue: - Invokana 100 mg in am - metformin 500 mg 3x a day with meals - Glipizide XL 5 mg daily in am  Please return in 3-4 months with your sugar log.

## 2015-03-30 NOTE — Telephone Encounter (Signed)
I called [employer] back AGAIN at [phone] and then followed prompts to Medical prior authorizations. Spoke to [place] who found the original appeal that was approved for 2 weeks, but could not find any record of the 2nd level appeal letter faxed on 12/29/14. She gave me her direct fax # [phone] and said it will take a couple of days for her to be able to get it in the system so that she can forward it to [employer]. She will call me when it is sent so that I will know status.

## 2015-03-31 ENCOUNTER — Other Ambulatory Visit: Payer: Self-pay | Admitting: Family Medicine

## 2015-04-02 MED ORDER — DICYCLOMINE HCL 10 MG PO CAPS: 10.0000 mg | ORAL_CAPSULE | Freq: Three times a day (TID) | ORAL | Status: DC

## 2015-04-02 NOTE — Telephone Encounter (Signed)
Sending to PA pool also in Dr Thompson Caul absence (see last entry below) for several days.

## 2015-04-02 NOTE — Addendum Note (Signed)
Addended by: Wardell Honour on: 04/02/2015 05:57 PM   Modules accepted: Orders

## 2015-04-02 NOTE — Telephone Encounter (Signed)
Dr Tamala Julian, trying to get coverage for the Mcarthur Rossetti has been a nightmare. I am waiting to hear back from 2nd level appeal that I re-sent 9/20, but in the meantime I was discussing w/pt and asked her what else she has tried. She stated she has never tried Bentyl. Would that be appropriate to Rx to her, and she can try it while waiting for appeal (this has dragged on since June)? It would be a LOT less even than pt's co-pay ($100) for the xifaxin.

## 2015-04-02 NOTE — Telephone Encounter (Signed)
Rx for Bentyl sent to pharmacy to try.  Please advise pt.

## 2015-04-02 NOTE — Telephone Encounter (Signed)
Dr. Tamala Julian - See correspondence below. Think it would probably be best for you to handle this.

## 2015-04-03 NOTE — Telephone Encounter (Signed)
Pt.notified

## 2015-04-06 ENCOUNTER — Encounter: Payer: Self-pay | Admitting: Family Medicine

## 2015-04-08 ENCOUNTER — Encounter: Payer: Self-pay | Admitting: Physical Therapy

## 2015-04-08 ENCOUNTER — Ambulatory Visit: Payer: Medicare Other | Attending: Family Medicine | Admitting: Physical Therapy

## 2015-04-08 DIAGNOSIS — N8184 Pelvic muscle wasting: Secondary | ICD-10-CM | POA: Insufficient documentation

## 2015-04-08 DIAGNOSIS — M6289 Other specified disorders of muscle: Secondary | ICD-10-CM

## 2015-04-08 NOTE — Therapy (Signed)
[facility] [address], [place], [place], [address] Phone: [phone]   Fax:  [phone]  Physical Therapy Evaluation  Patient Details  Name: [name] [name] MRN: [id number] Date of Birth: 06/07/1941 Referring Provider:  Wardell Honour, MD  Encounter Date: 04/08/2015      PT End of Session - 04/08/15 1356    Visit Number 1   Date for PT Re-Evaluation 06/03/15   PT Start Time 1200   PT Stop Time 1230   PT Time Calculation (min) 30 min   Activity Tolerance Patient tolerated treatment well   Behavior During Therapy [facility] for tasks assessed/performed      Past Medical History  Diagnosis Date  . Hypertension   . IBS (irritable bowel syndrome)     diarrhea predominant.  . Diabetes mellitus   . Allergy     [name]; allergy shots weekly.    . Arthritis     DDD lumbar spine.    . Cancer     breast L x 2; skin basal cell carcinoma [name]).  . 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 Anxiety     Past Surgical History  Procedure Laterality Date  . Mastectomy  2002    Bilateral for breast cancer  . Breast lumpectomy  1996    L breast cancer  . Vaginal hysterectomy  1985    DUB; uterine fibroids; ovaries intact.    There were no vitals filed for this visit.  Visit Diagnosis:  Pelvic floor dysfunction - Plan: PT plan of care cert/re-cert      Subjective Assessment - 04/08/15 1159    Subjective Patient reports urge incontinence 2-3 months with sudden onset.  Patient was placed on invocanna for blood sugar and could of been cause for urge incontinence.    Patient Stated Goals work on incontinence   Currently in Pain? No/denies   Multiple Pain Sites No            [facility] PT Assessment - 04/08/15 0001    Assessment   Medical Diagnosis N39.41 Urge incontinence   Onset Date/Surgical Date 12/09/14   Prior Therapy None  [facility]   Precautions   Precautions Fall;Other (comment)   Precaution Comments No ultrasound   Balance Screen   Has the patient fallen in the past 6 months Yes   How many times? 1  getting out of bed and slid off bed onto the floor   Has the patient had a decrease in activity level because of a fear of falling?  No   Is the patient reluctant to leave their home because of a fear of falling?  No   Prior Function   Level of Independence Independent   Leisure silver sneakers, Tai chi   Cognition   Overall Cognitive Status Within Functional Limits for tasks assessed   Observation/Other Assessments   Skin Integrity 21% limitation CJ  goal 5% limitation   ROM / Strength   AROM / PROM / Strength AROM;Strength   AROM   Lumbar Extension decreased by 50%   Lumbar - Right Side Bend decreased by 50%   Lumbar - Left Side Bend decreased by 50%   Strength   Overall Strength Comments bil. hip abduction 3/5                 Pelvic Floor Special Questions - 04/08/15 0001  Prior Pregnancies Yes   Number of Pregnancies 2   Number of Vaginal Deliveries 2   Any difficulty with labor and deliveries No   Currently Sexually Active No   Urinary Leakage Yes   How often 2   Pad use panty liner  panty liner at night   Activities that cause leaking With strong urge   Urinary urgency Yes   Urinary frequency varies daily   Fecal incontinence Yes  due to irritable bowel on occasion   Caffeine beverages 2   Falling out feeling (prolapse) No   External Perineal Exam vaginal dryness   Skin Integrity Irritaion present at  labial minora and majora is red   Pelvic Floor Internal Exam Patient confirms identification and approved physical therapist to perform pelvic floor muscle assessment   Exam Type Vaginal   Palpation no palpable tenderness; difficulty to contract pelvic floor with lower abdominal contraction   Strength Flicker  bears down at same time   Strength # of reps 5   Strength # of seconds 2                   PT Education - 04/08/15 1356    Education provided Yes   Education Details pelvic floor contraction; vaginal hygiene   Person(s) Educated Patient   Methods Explanation;Demonstration;Verbal cues;Tactile cues;Handout   Comprehension Returned demonstration;Verbalized understanding;Tactile cues required          PT Short Term Goals - 04/08/15 1419    PT SHORT TERM GOAL #1   Title urinary incontinence decreased >/= 25% when she has the urge   Baseline --   Time 4   Period Weeks   Status New   PT SHORT TERM GOAL #2   Title ability to contract pelvic floor without bearing down   Time 4   Period Weeks   Status New   PT SHORT TERM GOAL #3   Title ability to sit upright to contract pelvic floor correctly   Time 4   Period Weeks   Status New   PT SHORT TERM GOAL #4   Title --   Baseline --   PT SHORT TERM GOAL #5   Title --           PT Long Term Goals - 04/08/15 1421    PT LONG TERM GOAL #1   Title urinary leakage decreased >/= 75% when she has the urge   Time 8   Period Weeks   Status New   PT LONG TERM GOAL #2   Title just use one pad just in case to no pads due to decreased urinary leakage   Time 8   Period Weeks   Status New   PT LONG TERM GOAL #3   Title understand what bladder irritants are and how they affect the bladder.   Time 8   Period Weeks   Status New   PT LONG TERM GOAL #4   Title pelvic floor strength 3/5   Time 8   Status New   PT LONG TERM GOAL #5   Title --               Plan - 04/08/15 1357    Clinical Impression Statement Patient is a 74 year old female with urge incontinence that started 3 months ago.  Patient has to rush to the bathroom when she has the urge to urniate.  Patient has difficulty russhing due to decreased balance.  Patient has been in therapy in  the past  for balance.  Patient reports no pain. FOTO score is 21% limitaiton. Bil. hip abduction 3/5.  Abdominal strength is 2/5.  Pelvic floor  strength is 1/5 and when she tries to contract her pelvic floor whe will bear down.  Patient wears 2 pantyliners  during the day and 1 during the night.  Patient will benefit from physical therapy to increase pelvic floor strenght and coordinate contraction without bearing down.    Pt will benefit from skilled therapeutic intervention in order to improve on the following deficits Decreased coordination;Decreased strength;Decreased endurance   Rehab Potential Excellent   Clinical Impairments Affecting Rehab Potential None   PT Frequency 1x / week   PT Duration 8 weeks   PT Treatment/Interventions ADLs/Self Care Home Management;Biofeedback;Therapeutic exercise;Electrical Stimulation;Therapeutic activities;Neuromuscular re-education;Patient/family education;Manual techniques   PT Next Visit Plan pelvic floor contraction, hip abduction contraction, bladder irritants   PT Home Exercise Plan pelvic floor contraction    Recommended Other Services None   Consulted and Agree with Plan of Care Patient          G-Codes - 04-14-2015 1418    Functional Assessment Tool Used FOTO score is 21% limitaiton   Functional Limitation Other PT primary   Other PT Primary Current Status (D3220) At least 20 percent but less than 40 percent impaired, limited or restricted   Other PT Primary Goal Status (U5427) At least 1 percent but less than 20 percent impaired, limited or restricted       Problem List Patient Active Problem List   Diagnosis Date Noted  . IBS (irritable bowel syndrome) 12/01/2014  . Vertical diplopia 11/17/2014  . Non-melanoma skin cancer 05/13/2014  . Uncontrolled type 2 diabetes mellitus with nephropathy 04/22/2014  . Seasonal allergies 09/12/2011  . Anxiety 09/12/2011  . HTN (hypertension) 09/12/2011  . Breast cancer 09/12/2011  . GERD (gastroesophageal reflux disease) 09/12/2011  . Dyslipidemia 09/12/2011  . Osteopenia 09/12/2011  . DJD (degenerative joint disease) 09/12/2011  .  Factor 5 Leiden mutation, heterozygous 09/12/2011  . Personal history of colonic polyps 09/12/2011    GRAY,CHERYL,PT 2015-04-14, 2:29 PM  [facility] [address] [place], [place], [address] Phone: [phone]   Fax:  [phone]

## 2015-04-08 NOTE — Patient Instructions (Signed)
Adduction: Hip - Knees Together (Hook-Lying)   Lie with hips and knees bent, towel roll between knees. Push knees together. Hold for _3__ seconds. Rest for _3__ seconds. Repeat _5__ times. Do __3_ times a day.   Copyright  VHI. All rights reserved.  15 min. A day air out the vaginal   [facility] [address], [place], [place] [address] Phone # [phone] Fax [phone]

## 2015-04-12 ENCOUNTER — Ambulatory Visit: Payer: Medicare Other | Attending: Family Medicine | Admitting: Physical Therapy

## 2015-04-12 ENCOUNTER — Encounter: Payer: Self-pay | Admitting: Physical Therapy

## 2015-04-12 DIAGNOSIS — R269 Unspecified abnormalities of gait and mobility: Secondary | ICD-10-CM

## 2015-04-12 DIAGNOSIS — N8184 Pelvic muscle wasting: Secondary | ICD-10-CM | POA: Insufficient documentation

## 2015-04-12 DIAGNOSIS — M6289 Other specified disorders of muscle: Secondary | ICD-10-CM

## 2015-04-12 DIAGNOSIS — Z9181 History of falling: Secondary | ICD-10-CM | POA: Insufficient documentation

## 2015-04-12 DIAGNOSIS — R531 Weakness: Secondary | ICD-10-CM | POA: Diagnosis present

## 2015-04-12 NOTE — Patient Instructions (Signed)
Senior Bed Handle to use on bed to get up. You can get at [facility], [place], [employer].   Lay on your side to contract pelvic floor muscles with ball between legs.   Certain foods and liquids will decrease the pH making the urine more acidic.  Urinary urgency increases when the urine has a low pH.  Most common irritants: alcohol, carbonated beverages and caffinated beverages.  Foods to avoid: apple juice, apples, ascorbic acid, canteloupes, chili, citrus fruits, coffee, cranberries, grapes, guava, peaches, pepper, pineapple, plums, strawberries, tea, tomatoes, and vinegar.  Drinking plenty of water may help to increase the pH and dilute out any of the effects of specific irritants.  Foods that are NOT irritating to the bladder include: Pears, papayas, sun-brewed teas, watermelons, non-citrus herbal teas, apricots, kava and low-acid instant drinks (Postum)  Fill out a bladder diary for one day.    [facility] [address] [place], [place] [address] Phone # [phone] Fax [phone]

## 2015-04-12 NOTE — Therapy (Signed)
[facility] [address] [place], [place], [address] Phone: [phone]   Fax:  [phone]  Physical Therapy Treatment  Patient Details  Name: [name] [name] MRN: [id number] Date of Birth: [birth year] Referring Provider:  Wardell Honour, MD  Encounter Date: 04/12/2015      PT End of Session - 04/12/15 1547    Visit Number 2   Number of Visits 10  Medicare   Date for PT Re-Evaluation 06/03/15   PT Start Time 6226   PT Stop Time 3335   PT Time Calculation (min) 30 min   Activity Tolerance Patient tolerated treatment well   Behavior During Therapy [facility] for tasks assessed/performed      Past Medical History  Diagnosis Date  . Hypertension   . IBS (irritable bowel syndrome)     diarrhea predominant.  . Diabetes mellitus   . Allergy     [name]; allergy shots weekly.    . Arthritis     DDD lumbar spine.    . Cancer ([place])     breast L x 2; skin basal cell carcinoma [name]).  . 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 Anxiety     Past Surgical History  Procedure Laterality Date  . Mastectomy  2002    Bilateral for breast cancer  . Breast lumpectomy  1996    L breast cancer  . Vaginal hysterectomy  1985    DUB; uterine fibroids; ovaries intact.    There were no vitals filed for this visit.  Visit Diagnosis:  Pelvic floor dysfunction  Weakness generalized  Abnormality of gait  Risk for falls      Subjective Assessment - 04/12/15 1547    Subjective When I get out of bed I will slide off onto the floor.  I lost my exercises that you gave me last visit.    Patient Stated Goals work on incontinence   Currently in Pain? No/denies   Multiple Pain Sites No                         [facility] Adult PT Treatment/Exercise - 04/12/15 0001    Self-Care   Self-Care Other Self-Care Comments   Other Self-Care Comments  Instructed patient on bladder  irritants   Therapeutic Activites    Therapeutic Activities ADL's   ADL's getting in and out of bed safely, using her arms and having her thighs supported on the bed.    Lumbar Exercises: Supine   Ab Set 10 reps;3 seconds  using ball squeeze to contract pelvic floor   Lumbar Exercises: Sidelying   Other Sidelying Lumbar Exercises sidely pelvic floor contraction with ball squeeze hold 3 sec 10x                PT Education - 04/12/15 1609    Education provided Yes   Education Details pelvic floor contraction, bladder irritants, where to get a bed handrail   Person(s) Educated Patient   Methods Explanation;Demonstration;Verbal cues;Handout   Comprehension Returned demonstration;Verbalized understanding          PT Short Term Goals - 04/12/15 1615    PT SHORT TERM GOAL #1   Title urinary incontinence decreased >/= 25% when she has the urge   Time 4   Period Weeks   Status On-going   PT SHORT TERM GOAL #2   Title ability to contract pelvic floor  without bearing down   Time 4   Period Weeks   Status On-going   PT SHORT TERM GOAL #3   Title ability to sit upright to contract pelvic floor correctly   Time 4   Period Weeks   Status On-going           PT Long Term Goals - 04/08/15 1421    PT LONG TERM GOAL #1   Title urinary leakage decreased >/= 75% when she has the urge   Time 8   Period Weeks   Status New   PT LONG TERM GOAL #2   Title just use one pad just in case to no pads due to decreased urinary leakage   Time 8   Period Weeks   Status New   PT LONG TERM GOAL #3   Title understand what bladder irritants are and how they affect the bladder.   Time 8   Period Weeks   Status New   PT LONG TERM GOAL #4   Title pelvic floor strength 3/5   Time 8   Status New   PT LONG TERM GOAL #5   Title --               Plan - 04/12/15 1613    Clinical Impression Statement Patient is a 74 year old female with urge incontinence.  Patient is learning how  to contract pelvic floor but does better in sidely than hookly.  Patient has difficulty getting out of bed to go to the bathroom due ot sliding out of the bed.  Patient was instructed on ways to get out of bed correctly.  Patient has not met goals due to coming 15 min. late and being her first treatment.    Pt will benefit from skilled therapeutic intervention in order to improve on the following deficits Decreased coordination;Decreased strength;Decreased endurance   Rehab Potential Excellent   Clinical Impairments Affecting Rehab Potential None   PT Frequency 1x / week   PT Duration 8 weeks   PT Treatment/Interventions ADLs/Self Care Home Management;Biofeedback;Therapeutic exercise;Electrical Stimulation;Therapeutic activities;Neuromuscular re-education;Patient/family education;Manual techniques   PT Next Visit Plan pelvic EMG   PT Home Exercise Plan pelvic floor contraction    Consulted and Agree with Plan of Care Patient        Problem List Patient Active Problem List   Diagnosis Date Noted  . IBS (irritable bowel syndrome) 12/01/2014  . Vertical diplopia 11/17/2014  . Non-melanoma skin cancer 05/13/2014  . Uncontrolled type 2 diabetes mellitus with nephropathy ([place]) 04/22/2014  . Seasonal allergies 09/12/2011  . Anxiety 09/12/2011  . HTN (hypertension) 09/12/2011  . Breast cancer ([place]) 09/12/2011  . GERD (gastroesophageal reflux disease) 09/12/2011  . Dyslipidemia 09/12/2011  . Osteopenia 09/12/2011  . DJD (degenerative joint disease) 09/12/2011  . Factor 5 Leiden mutation, heterozygous ([place]) 09/12/2011  . Personal history of colonic polyps 09/12/2011    GRAY,CHERYL,PT 04/12/2015, 4:16 PM  [facility] [address], [place], [place], [address] Phone: [phone]   Fax:  [phone]

## 2015-04-18 ENCOUNTER — Other Ambulatory Visit: Payer: Self-pay | Admitting: Family Medicine

## 2015-04-19 MED ORDER — CANAGLIFLOZIN 100 MG PO TABS: 100.0000 mg | ORAL_TABLET | Freq: Every day | ORAL | Status: DC

## 2015-04-19 NOTE — Addendum Note (Signed)
Addended by: Elwyn Reach A on: 04/19/2015 01:10 PM   Modules accepted: Orders

## 2015-04-19 NOTE — Telephone Encounter (Signed)
Pharm called to check sig for invokana that they had req'd w/sig BID and pt has only been taking it QD, and Rx was sent in w/ #90. I checked OV notes and verified pt is only taking this QD.

## 2015-04-19 NOTE — Telephone Encounter (Signed)
Called in.

## 2015-04-21 ENCOUNTER — Ambulatory Visit: Payer: Medicare Other | Admitting: Physical Therapy

## 2015-04-29 ENCOUNTER — Ambulatory Visit: Payer: Medicare Other | Admitting: Physical Therapy

## 2015-04-29 ENCOUNTER — Telehealth: Payer: Self-pay

## 2015-04-29 ENCOUNTER — Encounter: Payer: Self-pay | Admitting: Physical Therapy

## 2015-04-29 DIAGNOSIS — R531 Weakness: Secondary | ICD-10-CM

## 2015-04-29 DIAGNOSIS — M6289 Other specified disorders of muscle: Secondary | ICD-10-CM

## 2015-04-29 DIAGNOSIS — R269 Unspecified abnormalities of gait and mobility: Secondary | ICD-10-CM

## 2015-04-29 DIAGNOSIS — Z9181 History of falling: Secondary | ICD-10-CM

## 2015-04-29 DIAGNOSIS — N8184 Pelvic muscle wasting: Secondary | ICD-10-CM | POA: Diagnosis not present

## 2015-04-29 NOTE — Therapy (Addendum)
[facility] [address] [place], [place], [address] Phone: [phone]   Fax:  [phone]  Physical Therapy Treatment  Patient Details  Name: [name] [name] MRN: [id number] Date of Birth: [birth year] Referring Provider: Dr. Reginia Forts  Encounter Date: 04/29/2015      PT End of Session - 04/29/15 1411    Visit Number 3   Number of Visits 10  Medicare   Date for PT Re-Evaluation 06/03/15   PT Start Time 1638   PT Stop Time 1443   PT Time Calculation (min) 32 min   Activity Tolerance Patient tolerated treatment well   Behavior During Therapy [facility] for tasks assessed/performed      Past Medical History  Diagnosis Date  . Hypertension   . IBS (irritable bowel syndrome)     diarrhea predominant.  . Diabetes mellitus   . Allergy     [name]; allergy shots weekly.    . Arthritis     DDD lumbar spine.    . Cancer ([place])     breast L x 2; skin basal cell carcinoma [name]).  . 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 Anxiety     Past Surgical History  Procedure Laterality Date  . Mastectomy  2002    Bilateral for breast cancer  . Breast lumpectomy  1996    L breast cancer  . Vaginal hysterectomy  1985    DUB; uterine fibroids; ovaries intact.    There were no vitals filed for this visit.  Visit Diagnosis:  Pelvic floor dysfunction  Abnormality of gait  Weakness generalized  Risk for falls      Subjective Assessment - 04/29/15 1412    Subjective There are  days I do not have leakage.  Urge reduced by 25%. I got a bedrail for bed and helping me get in and out of bed.    Patient Stated Goals work on incontinence   Currently in Pain? No/denies            [facility] PT Assessment - 04/29/15 0001    Assessment   Referring Provider Dr. Reginia Forts                  Pelvic Floor Special Questions - 04/29/15 0001    Biofeedback sensor type  Surface  vaginal   Biofeedback Activity Obturator/adductor assist;Other  contraction of pelvic floor with and without hip adduction           [facility] Adult PT Treatment/Exercise - 04/29/15 0001    Self-Care   Self-Care Other Self-Care Comments   Other Self-Care Comments  reviewed bladder diary and when she drinks tea she has the urge to go to the bathroom, patient does not drink water and recommended her t oincrease water intake      G-code functional assessment tool FOTO score is 21% limitation, functional limitation other Pt primary, goal status CI, and discharge status is CJ. Earlie Counts, PT 05/31/2015 2:03 PM            PT Education - 04/29/15 1443    Education provided Yes   Education Details using a carrot with a glove over the top and place in vagina while performing exercises for tactile cues   Person(s) Educated Patient   Methods Explanation;Demonstration;Verbal cues   Comprehension Verbalized understanding          PT Short Term Goals - 04/29/15 1411  PT SHORT TERM GOAL #1   Title urinary incontinence decreased >/= 25% when she has the urge   Time 4   Period Weeks   Status Achieved  decreased by 25%   PT SHORT TERM GOAL #2   Title ability to contract pelvic floor without bearing down   Time 4   Period Weeks   Status Achieved   PT SHORT TERM GOAL #3   Title ability to sit upright to contract pelvic floor correctly   Time 4   Period Weeks   Status Achieved           PT Long Term Goals - 04/08/15 1421    PT LONG TERM GOAL #1   Title urinary leakage decreased >/= 75% when she has the urge   Time 8   Period Weeks   Status New   PT LONG TERM GOAL #2   Title just use one pad just in case to no pads due to decreased urinary leakage   Time 8   Period Weeks   Status New   PT LONG TERM GOAL #3   Title understand what bladder irritants are and how they affect the bladder.   Time 8   Period Weeks   Status New   PT LONG TERM GOAL #4   Title  pelvic floor strength 3/5   Time 8   Status New   PT LONG TERM GOAL #5   Title --               Plan - 04/29/15 1444    Clinical Impression Statement Patient is a 34 year odl female with urge incontinence. Patient came 10 min late. Patient is having trouble feeing her pelvic floor contraction therefore is going to use a carrot with a glove on it for tactile cues with pelvic floor contraction.  Patient feels he rurge to urinate has decreased by 25%.  Patient is able to contract pelvic floor better with hip adduction then isolation.  Patient can contract with isolation 0.5uv.  Patient will benefit form physical therapy to work on pelvic floor strength.    Pt will benefit from skilled therapeutic intervention in order to improve on the following deficits Decreased coordination;Decreased strength;Decreased endurance   Rehab Potential Excellent   Clinical Impairments Affecting Rehab Potential None   PT Frequency 1x / week   PT Duration 8 weeks   PT Treatment/Interventions ADLs/Self Care Home Management;Biofeedback;Therapeutic exercise;Electrical Stimulation;Therapeutic activities;Neuromuscular re-education;Patient/family education;Manual techniques   PT Next Visit Plan pelvic floor contraction   PT Home Exercise Plan progress as needed   Consulted and Agree with Plan of Care Patient        Problem List Patient Active Problem List   Diagnosis Date Noted  . IBS (irritable bowel syndrome) 12/01/2014  . Vertical diplopia 11/17/2014  . Non-melanoma skin cancer 05/13/2014  . Uncontrolled type 2 diabetes mellitus with nephropathy ([place]) 04/22/2014  . Seasonal allergies 09/12/2011  . Anxiety 09/12/2011  . HTN (hypertension) 09/12/2011  . Breast cancer ([place]) 09/12/2011  . GERD (gastroesophageal reflux disease) 09/12/2011  . Dyslipidemia 09/12/2011  . Osteopenia 09/12/2011  . DJD (degenerative joint disease) 09/12/2011  . Factor 5 Leiden mutation, heterozygous ([place]) 09/12/2011  .  Personal history of colonic polyps 09/12/2011    Nicholl Onstott,PT 04/29/2015, 2:48 PM  [facility] [address], [place], [place], [address] Phone: [phone]   Fax:  [phone]  Name: [name] [name] MRN: [id number] Date of Birth: 08/16/1940  PHYSICAL THERAPY DISCHARGE SUMMARY  Visits from Start of Care: 3  Current functional level related to goals / functional outcomes: See above. Patient did not return to therapy to be reassessed.   Remaining deficits: See above   Education / Equipment: HEP Plan:                                                    Patient goals were not met. Patient is being discharged due to not returning since the last visit. Thank you for referral. Earlie Counts, PT 05/31/2015 2:05 PM   ?????

## 2015-04-29 NOTE — Telephone Encounter (Signed)
°  Pt requested to LV message with [employer], multiple Xftr's to X [id number] unsuccessful; Pt is in need of Refills: rifaximin (XIFAXAN) 550 MG TABS tablet  Pharmacy: [facility], [place]

## 2015-04-30 MED ORDER — RIFAXIMIN 550 MG PO TABS: 550.0000 mg | ORAL_TABLET | Freq: Two times a day (BID) | ORAL | Status: DC

## 2015-04-30 NOTE — Telephone Encounter (Signed)
Please advise on refill.

## 2015-04-30 NOTE — Telephone Encounter (Signed)
Please inform pt rx sent to pharmacy

## 2015-05-03 ENCOUNTER — Encounter: Payer: Medicare Other | Admitting: Physical Therapy

## 2015-05-05 NOTE — Telephone Encounter (Signed)
I was out of town when this message was taken. Please see notes under 12/22/14 message pertaining to PA/Appeal on Xifaxan. Called pt and verified that the Bentyl she tried last month was not NEARLY as effective as Xifaxan. I will call ins again to check on appeal status.

## 2015-05-05 NOTE — Telephone Encounter (Signed)
See notes under 04/29/15 message. Called [employer] back to check on appeal. Gave rep ref # [id number] to check status. Was advised that they received 2nd appeal letter on 03/30/15 and was sent to [facility]. I advised rep that I have been told several times by [employer] that they do NOT handle appeals, only PAs, and that [facility] has to do the appeal. The rep gave me a new number to call [phone] and fax [phone] w/new ref # [id number] for an EXPEDITED appeal.  Called # above and was advised that they cannot do another appeal unless we have a denial of coverage after PA is done. The orig PA has expired. Transferred me to a PA rep who completed the PA and sent for review (up to 72 hrs). Ref # for PA is PA [id number].

## 2015-05-07 NOTE — Telephone Encounter (Signed)
New PA was also denied because pt has already had 3 treatment courses in her lifetime of xifaxin. I called and completed an expedited appeal and should hear w/in 72 hours. May receive a form to complete by fax. Appeal Pending.

## 2015-05-10 ENCOUNTER — Encounter: Payer: Medicare Other | Admitting: Physical Therapy

## 2015-05-17 ENCOUNTER — Encounter: Payer: Medicare Other | Admitting: Physical Therapy

## 2015-05-17 NOTE — Telephone Encounter (Signed)
[employer] rep, Alma, called and stated that she will set up a peer to peer to discuss why pt needs to remain on xifaxin. They will call back today or tomorrow. I asked for call after 1 pm so that a PA can help if Dr Tamala Julian is not in. Dr Tamala Julian, [place]

## 2015-05-17 NOTE — Telephone Encounter (Signed)
I just got a fax stating that the appeal was approved but only for 2 weeks again! I called and left message with Appeal rep who processed it and also faxed a copy of the letter we wrote on 12/29/14 when we first asked for a 2nd level appeal to extend coverage for the whole year.

## 2015-05-17 NOTE — Telephone Encounter (Signed)
Once again called pt and LMOM w/update and encouraged pt to get her Rx filled before appeal coverage ends on 11/11.

## 2015-05-18 ENCOUNTER — Telehealth: Payer: Self-pay | Admitting: Family Medicine

## 2015-05-18 MED ORDER — HYOSCYAMINE SULFATE ER 0.375 MG PO TB12: 0.3750 mg | ORAL_TABLET | Freq: Two times a day (BID) | ORAL | Status: DC | PRN

## 2015-05-18 NOTE — Telephone Encounter (Signed)
Spoke with pharmacist at insurance/Medicare plan. Requiring peer to peer for appeal on Xifaxin rx for IBS.  Per pharmacist, Mcarthur Rossetti only indicated for two week treatment of IBS; approval for three separate two week treatments per lifetime.  Approval for 2 week supply only.  Insurance will not approve further because on FDA approval for chronic IBS treatment.  Please advise patient.

## 2015-05-18 NOTE — Telephone Encounter (Signed)
Sent rx for hyoscyamine to [facility]. Not sure if insurance will pay for medication yet is generic.

## 2015-05-18 NOTE — Telephone Encounter (Signed)
Dr Tamala Julian spoke to the [facility] pharmacist and was advised that Xifaxin has no FDA approval for use for more than 2 week treatment periods, and will only approve coverage for a max of three 2 wk periods per lifetime.  Dr Tamala Julian, do you want to send in a Rx for Levbid or other med for pt to try?

## 2015-05-18 NOTE — Addendum Note (Signed)
Addended by: Wardell Honour on: 05/18/2015 05:13 PM   Modules accepted: Orders

## 2015-05-19 NOTE — Telephone Encounter (Signed)
Spoke with pt. Pt understood.

## 2015-05-19 NOTE — Telephone Encounter (Signed)
Left message for pt to call back  °

## 2015-05-24 ENCOUNTER — Encounter: Payer: Medicare Other | Admitting: Physical Therapy

## 2015-07-22 ENCOUNTER — Other Ambulatory Visit (INDEPENDENT_AMBULATORY_CARE_PROVIDER_SITE_OTHER): Payer: Medicare Other | Admitting: Family Medicine

## 2015-07-22 DIAGNOSIS — I10 Essential (primary) hypertension: Secondary | ICD-10-CM

## 2015-07-22 DIAGNOSIS — E1165 Type 2 diabetes mellitus with hyperglycemia: Secondary | ICD-10-CM | POA: Diagnosis not present

## 2015-07-22 DIAGNOSIS — E1122 Type 2 diabetes mellitus with diabetic chronic kidney disease: Secondary | ICD-10-CM

## 2015-07-22 DIAGNOSIS — E785 Hyperlipidemia, unspecified: Secondary | ICD-10-CM

## 2015-07-22 LAB — COMPREHENSIVE METABOLIC PANEL
ALT: 13 U/L (ref 6–29)
AST: 15 U/L (ref 10–35)
Albumin: 4.5 g/dL (ref 3.6–5.1)
Alkaline Phosphatase: 76 U/L (ref 33–130)
BUN: 25 mg/dL (ref 7–25)
CO2: 29 mmol/L (ref 20–31)
Calcium: 9.7 mg/dL (ref 8.6–10.4)
Chloride: 102 mmol/L (ref 98–110)
Creat: 1.25 mg/dL — ABNORMAL HIGH (ref 0.60–0.93)
Glucose, Bld: 126 mg/dL — ABNORMAL HIGH (ref 65–99)
Potassium: 4 mmol/L (ref 3.5–5.3)
Sodium: 140 mmol/L (ref 135–146)
Total Bilirubin: 0.6 mg/dL (ref 0.2–1.2)
Total Protein: 6.8 g/dL (ref 6.1–8.1)

## 2015-07-22 LAB — POCT GLYCOSYLATED HEMOGLOBIN (HGB A1C): Hemoglobin A1C: 6.3

## 2015-07-22 NOTE — Progress Notes (Signed)
Patient here for lab work order. Has standing orders for CMP and HgA1C

## 2015-07-29 ENCOUNTER — Ambulatory Visit (INDEPENDENT_AMBULATORY_CARE_PROVIDER_SITE_OTHER): Payer: Medicare Other | Admitting: Internal Medicine

## 2015-07-29 ENCOUNTER — Encounter: Payer: Self-pay | Admitting: Internal Medicine

## 2015-07-29 VITALS — BP 114/62 | HR 86 | Temp 97.8°F | Resp 12 | Wt 176.6 lb

## 2015-07-29 DIAGNOSIS — E1165 Type 2 diabetes mellitus with hyperglycemia: Secondary | ICD-10-CM

## 2015-07-29 DIAGNOSIS — E1121 Type 2 diabetes mellitus with diabetic nephropathy: Secondary | ICD-10-CM | POA: Diagnosis not present

## 2015-07-29 NOTE — Patient Instructions (Signed)
Please continue: - Invokana 100 mg in am - Metformin 500 mg 3x a day with meals - Glipizide XL 5 mg daily in am  Please return in 4 months with your sugar log.

## 2015-07-29 NOTE — Progress Notes (Signed)
Patient ID: [name] [name], female   DOB: 1940-12-05, 75 y.o.   MRN: [id number]  HPI: [name] [name] is a 75 y.o.-year-old female, returning for follow-up for DM2, dx 2014, non-insulin-dependent, uncontrolled, with complications (mild CKD). Last visit 4 month ago.  Last hemoglobin A1c was: Lab Results  Component Value Date   HGBA1C 6.3 07/22/2015   HGBA1C 6.2* 02/25/2015   HGBA1C 6.1* 10/15/2014   Pt is on a regimen of: - Invokana 100 mg in am - started 04/2014 - metformin 500 mg tid - started 04/2014  - Glipizide XL 5 mg daily, before breakfast  Pt checks her sugars 0-1x a day: - A.m.: 793-903 (later higher after reducing metformin) >> 107-154 >> 102-152 >> 99-140 - 2 hours post breakfast: 264 >> n/c  - Before lunch: n/c >> 95-144 >> 126-133 >> n/c - 2 hours post lunch: n/c >> 85-111 >> 113-116, 140 >> n/c - before dinner: n/c >> 82-125 >> 69 x2, 96-113, 189 >> n/c - 2 hours post dinner: n/c  >> 101, 103 >> n/c - Bedtime: n/c >> 84-121 >> n/c  Pt's meals are: - Breakfast: apple + cottage cheese + whole wheat - Lunch: leftovers from dinner; sandwich; salad - Dinner: varies; some fast food - Snacks: veggies; chips Some diet sodas.  She exercises - [employer] [facility] [facility] [employer] [facility]  [facility], Tai Chi 1x a week.  - + CKD, last BUN/creatinine:  Lab Results  Component Value Date   BUN 25 07/22/2015   CREATININE 1.25* 07/22/2015  Not on an ACEI. - last set of lipids: Lab Results  Component Value Date   CHOL 166 02/25/2015   HDL 37* 02/25/2015   LDLCALC 81 02/25/2015   TRIG 240* 02/25/2015   CHOLHDL 4.5 02/25/2015  Not on a statin. - last eye exam was in 11/03/2014. No DR. She had cataract sx.  - + numbness and tingling in her feet. 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 She used Lidocaine patches >> discontinued. We started PN cream , but this was expensive (93$) and did not work much >> Voltaren gel works a little better.  I reviewed pt's medications, allergies, PMH,  social hx, family hx, and changes were documented in the history of present illness. Otherwise, unchanged from my initial visit note.  ROS: Constitutional: no weight gain/loss, no fatigue, no subjective hyperthermia/hypothermia Eyes: no blurry vision, no xerophthalmia ENT: no sore throat, no nodules palpated in throat, no dysphagia/odynophagia, no hoarseness Cardiovascular: no CP/SOB/palpitations/leg swelling Respiratory: no cough/SOB Gastrointestinal: no N/ V/D/C/heartburn, + IBS sxs Musculoskeletal: no muscle/joint aches Skin: no rashes, no hair loss Neurological: no tremors/numbness/itching  PE: BP 114/62 mmHg  Pulse 86  Temp(Src) 97.8 F (36.6 C) (Oral)  Resp 12  Wt 176 lb 9.6 oz (80.105 kg)  SpO2 97% Body mass index is 28.93 kg/(m^2). Wt Readings from Last 3 Encounters:  07/29/15 176 lb 9.6 oz (80.105 kg)  03/29/15 177 lb (80.287 kg)  03/22/15 176 lb (79.833 kg)   Constitutional: overweight, in NAD Eyes: PERRLA, EOMI, no exophthalmos ENT: moist mucous membranes, no thyromegaly, no cervical lymphadenopathy Cardiovascular: RRR, No MRG Respiratory: CTA B Gastrointestinal: abdomen soft, NT, ND, BS+ Musculoskeletal: no deformities, strength intact in all 4 Skin: moist, warm, no rashes Neurological: + tremor with outstretched hands, DTR normal in all 4  ASSESSMENT: 1. DM2, non-insulin-dependent, uncontrolled, with complications - mild CKD  PLAN:  1. Patient with now better controlled diabetes, on oral antidiabetic regimen with  Invokana, Metformin, and Glipizide  >> sugars are great! - per log and per last HbA1c (6.3%).  Patient Instructions  Please continue: - Invokana 100 mg in am - Metformin 500 mg 3x a day with meals - Glipizide XL 5 mg daily in am  Please return in 4 months with your sugar log.   - continue checking sugars at different times of the day - check 1-2 times a day, rotating checks - advised for yearly eye exams - she is up to date - Return to clinic  in 4 mo with sugar log

## 2015-08-06 ENCOUNTER — Telehealth: Payer: Self-pay

## 2015-08-06 NOTE — Telephone Encounter (Signed)
The patient called to request a referral to the Geriatric Clinic at [facility] from Dr Tamala Julian.  She said she primarily has issues with balance, and would like assistance through this program.  The phone number for new patients is [phone].  CB#: [phone]

## 2015-08-12 ENCOUNTER — Telehealth: Payer: Self-pay

## 2015-08-12 DIAGNOSIS — R2681 Unsteadiness on feet: Secondary | ICD-10-CM

## 2015-08-12 NOTE — Telephone Encounter (Signed)
Talking with the Stitch - [name] [name] - evaluation for balance control.  Consultation only for evaluation.  Dr. Hillery Aldo   Scheduler - Rip Harbour  [phone]  Booked well into the future - Dr. Tamala Julian please do a referral.    [phone]

## 2015-08-13 NOTE — Telephone Encounter (Signed)
Referral placed.

## 2015-08-13 NOTE — Telephone Encounter (Signed)
Call patient --- referral placed to [facility]/Dr. Martyn Ehrich.

## 2015-08-16 NOTE — Telephone Encounter (Signed)
LM with patient's husband for patient to CB.

## 2015-08-17 NOTE — Telephone Encounter (Signed)
Spoke to patient and told her that Dr. Tamala Julian has placed her referral to [facility] with Dr. Martyn Ehrich.

## 2015-08-17 NOTE — Telephone Encounter (Signed)
L/m for patient to call back

## 2015-09-16 ENCOUNTER — Telehealth: Payer: Self-pay

## 2015-09-16 DIAGNOSIS — R269 Unspecified abnormalities of gait and mobility: Secondary | ICD-10-CM

## 2015-09-16 DIAGNOSIS — G609 Hereditary and idiopathic neuropathy, unspecified: Secondary | ICD-10-CM

## 2015-09-16 NOTE — Telephone Encounter (Signed)
The patient called to request a neurology referral to [facility] from Dr Tamala Julian.  She said [facility] told her it had been over three years, so she would need a referral to schedule with them.  CB#: [phone]

## 2015-09-16 NOTE — Telephone Encounter (Signed)
Msg is for Dr. Tamala Julian Pt had a referral to neurology however since it has been 3 years ago, she will need another referral to neurology.  Please advise  [phone]

## 2015-09-17 NOTE — Telephone Encounter (Signed)
Referral placed to [facility].

## 2015-09-20 ENCOUNTER — Ambulatory Visit: Payer: Medicare Other | Admitting: Family Medicine

## 2015-09-29 ENCOUNTER — Telehealth: Payer: Self-pay

## 2015-09-29 NOTE — Telephone Encounter (Signed)
Pt called in stating that she made her own appointment at the [facility] in [place] for 10/12/2015 and they need all of her office notes and information faxed over before the appointment. Fax # [phone]. Phone # [phone].

## 2015-10-05 ENCOUNTER — Ambulatory Visit: Payer: Medicare Other | Admitting: Family Medicine

## 2015-10-21 ENCOUNTER — Other Ambulatory Visit: Payer: Self-pay | Admitting: Internal Medicine

## 2015-10-26 ENCOUNTER — Encounter: Payer: Self-pay | Admitting: Family Medicine

## 2015-10-26 ENCOUNTER — Ambulatory Visit (INDEPENDENT_AMBULATORY_CARE_PROVIDER_SITE_OTHER): Payer: Medicare Other | Admitting: Family Medicine

## 2015-10-26 VITALS — BP 134/60 | HR 77 | Temp 98.5°F | Resp 16 | Ht 64.5 in | Wt 164.2 lb

## 2015-10-26 DIAGNOSIS — I10 Essential (primary) hypertension: Secondary | ICD-10-CM

## 2015-10-26 DIAGNOSIS — R002 Palpitations: Secondary | ICD-10-CM | POA: Diagnosis not present

## 2015-10-26 DIAGNOSIS — M8580 Other specified disorders of bone density and structure, unspecified site: Secondary | ICD-10-CM

## 2015-10-26 DIAGNOSIS — J302 Other seasonal allergic rhinitis: Secondary | ICD-10-CM | POA: Diagnosis not present

## 2015-10-26 DIAGNOSIS — E1121 Type 2 diabetes mellitus with diabetic nephropathy: Secondary | ICD-10-CM

## 2015-10-26 DIAGNOSIS — H6121 Impacted cerumen, right ear: Secondary | ICD-10-CM | POA: Diagnosis not present

## 2015-10-26 DIAGNOSIS — F419 Anxiety disorder, unspecified: Secondary | ICD-10-CM | POA: Diagnosis not present

## 2015-10-26 DIAGNOSIS — D6851 Activated protein C resistance: Secondary | ICD-10-CM | POA: Diagnosis not present

## 2015-10-26 DIAGNOSIS — K589 Irritable bowel syndrome without diarrhea: Secondary | ICD-10-CM

## 2015-10-26 DIAGNOSIS — E785 Hyperlipidemia, unspecified: Secondary | ICD-10-CM

## 2015-10-26 NOTE — Progress Notes (Signed)
Subjective:    Patient ID: [name] [name], female    DOB: [birth year], 75 y.o.   MRN: [id number]  10/26/2015  Follow-up   HPI This 75 y.o. female presents for six month follow-up:   1. Gait instability/ataxia:  Referred to geriatric clinic; no major learning points.  No follow-up; released; PRN follow-up only. Recommended stopping Benadryl.  Participating in [place].    2.  DMII: followed by Jannifer Franklin of endocrinology; no recent issues.  3.  Allergic Rhinitis: astelin, Claritin, Singulair; allergy shots.  [name].  Not doing Flonase.  4.  Venous stasis: mild stasis present.  Lasix 17m one daily.  5.  HTN: Patient reports good compliance with medication, good tolerance to medication, and good symptom control.  Metoprolol  XL two daily.    6.  IBS diarrhea: insurance would not cover Rifaximin.    7. Anxiety and depression: stable.  No recent issues.  Not taking Buspar or Valium recently.  Stopped two months ago.  Had been taking medication for years.  Decreased stressors with retirement.   Review of Systems  Constitutional: Negative for fever, chills, diaphoresis and fatigue.  HENT: Negative for congestion, rhinorrhea, sneezing and sore throat.   Eyes: Negative for visual disturbance.  Respiratory: Negative for cough and shortness of breath.   Cardiovascular: Positive for leg swelling. Negative for chest pain and palpitations.  Gastrointestinal: Positive for diarrhea. Negative for nausea, vomiting, abdominal pain and constipation.  Endocrine: Negative for cold intolerance, heat intolerance, polydipsia, polyphagia and polyuria.  Neurological: Negative for dizziness, tremors, seizures, syncope, facial asymmetry, speech difficulty, weakness, light-headedness, numbness and headaches.  Psychiatric/Behavioral: Negative for suicidal ideas, sleep disturbance, self-injury and dysphoric mood. The patient is not nervous/anxious.     Past Medical History  Diagnosis Date  . Hypertension    . IBS (irritable bowel syndrome)     diarrhea predominant.  . Diabetes mellitus   . Allergy     VHarold Hedge allergy shots weekly.    . Arthritis     DDD lumbar spine.    . 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  . 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 Anxiety    Past Surgical History  Procedure Laterality Date  . Mastectomy  2002    Bilateral for breast cancer  . Breast lumpectomy  1996    L breast cancer  . Vaginal hysterectomy  1985    DUB; uterine fibroids; ovaries intact.   Allergies  Allergen Reactions  . Fruit & Vegetable Daily [Nutritional Supplements] Diarrhea  . Onion Other (See Comments)    indigestion  . Tree Extract Swelling  . Sulfa Drugs Cross Reactors Rash    All over the body   Current Outpatient Prescriptions  Medication Sig Dispense Refill  . aspirin 81 MG tablet Take 81 mg by mouth daily.    .Marland Kitchenazelastine (ASTELIN) 0.1 % nasal spray Place 1 spray into both nostrils 2 (two) times daily as needed for rhinitis or allergies.     . Blood Glucose Monitoring Suppl (ONE TOUCH ULTRA 2) W/DEVICE KIT Inject 1 application as directed as needed.    . canagliflozin (INVOKANA) 100 MG TABS tablet Take 1 tablet (100 mg total) by mouth daily. 90 tablet 1  . cholecalciferol (VITAMIN D) 1000 UNITS tablet Take 2,000 Units by mouth daily.    .Marland Kitchenconjugated estrogens (PREMARIN) vaginal cream Place 1 Applicatorful vaginally  daily. 42.5 g 12  . desloratadine (CLARINEX) 5 MG tablet Take 5 mg by mouth daily.    Marland Kitchen dicyclomine (BENTYL) 10 MG capsule TAKE 1 CAPSULE 4 TIMES DAILY (BEFORE MEALS AND AT BEDTIME). 120 capsule 5  . EPIPEN 2-PAK 0.3 MG/0.3ML SOAJ injection Inject 0.3 mLs as directed as needed. Reported on 10/26/2015    . furosemide (LASIX) 20 MG tablet TAKE 1 OR 2 TABLETS DAILY AS DIRECTED. 180 tablet 1  . glipiZIDE (GLIPIZIDE XL) 5 MG 24 hr tablet Take 1 tablet (5 mg total) by mouth daily with breakfast. 90  tablet 1  . metFORMIN (GLUCOPHAGE) 500 MG tablet TAKE 1 TABLET 3 TIMES DAILY WITH MEALS. 270 tablet 0  . Misc Natural Products (TART CHERRY ADVANCED PO) Take 1 capsule by mouth daily.    . montelukast (SINGULAIR) 10 MG tablet TAKE ONE TABLET AT BEDTIME. 90 tablet 3  . ONETOUCH DELICA LANCETS 15B MISC Use to test blood sugar 2 times daily as instructed. Dx: E11.29 100 each 3  . oxybutynin (DITROPAN-XL) 5 MG 24 hr tablet TAKE ONE TABLET AT BEDTIME. 30 tablet 5  . rifaximin (XIFAXAN) 550 MG TABS tablet Take 1 tablet (550 mg total) by mouth 2 (two) times daily. 60 tablet 11  . TOPROL XL 100 MG 24 hr tablet TAKE 1 TABLET TWICE DAILY WITH FOOD. 180 tablet 1  . busPIRone (BUSPAR) 30 MG tablet TAKE 1 TABLET TWICE DAILY. (Patient not taking: Reported on 10/26/2015) 180 tablet 1  . diazepam (VALIUM) 5 MG tablet TAKE 1/2 TABLET AT BEDTIME AS NEEDED. (Patient not taking: Reported on 10/26/2015) 30 tablet 0  . ECHINACEA EXTRACT PO Take 1 capsule by mouth daily as needed. Reported on 10/26/2015    . fluticasone (FLONASE) 50 MCG/ACT nasal spray Place 2 sprays into the nose daily. (Patient not taking: Reported on 10/26/2015) 16 g 11  . glucose blood (ONETOUCH VERIO) test strip Use to test blood sugar 2 times daily as instructed. Dx: E11.29 100 each 3  . hyoscyamine (LEVBID) 0.375 MG 12 hr tablet Take 1 tablet (0.375 mg total) by mouth every 12 (twelve) hours as needed. (Patient not taking: Reported on 10/26/2015) 40 tablet 2  . ketotifen (ZADITOR) 0.025 % ophthalmic solution Place 1 drop into both eyes 2 (two) times daily as needed (allergies). Reported on 10/26/2015    . nystatin (MYCOSTATIN/NYSTOP) 100000 UNIT/GM POWD Apply 1 g topically 2 (two) times daily. (Patient not taking: Reported on 10/26/2015) 60 g 3   No current facility-administered medications for this visit.   Social History   Social History  . Marital Status: Married    Spouse Name: N/A  . Number of Children: N/A  . Years of Education: N/A    Occupational History  . Not on file.   Social History Main Topics  . Smoking status: Never Smoker   . Smokeless tobacco: Never Used  . Alcohol Use: 0.0 oz/week    0 Standard drinks or equivalent per week     Comment: social  . Drug Use: No  . Sexual Activity: No   Other Topics Concern  . Not on file   Social History Narrative   Marital status: married x 16 years      Children:  2 children; 3 grandchildren      Lives: with husband      Employment: retired      Tobacco: never      Alcohol: rarely      Exercise:  [employer], [employer]; Tai Chi  once per week         Family History  Problem Relation Age of Onset  . Heart disease Mother   . Heart disease Father   . Heart disease Brother        Objective:    BP 134/60 mmHg  Pulse 77  Temp(Src) 98.5 F (36.9 C) (Oral)  Resp 16  Ht 5' 4.5" (1.638 m)  Wt 164 lb 3.2 oz (74.481 kg)  BMI 27.76 kg/m2  SpO2 97% Physical Exam  Constitutional: She is oriented to person, place, and time. She appears well-developed and well-nourished. No distress.  HENT:  Head: Normocephalic and atraumatic.  Right Ear: External ear normal.  Left Ear: External ear normal.  Nose: Nose normal.  Mouth/Throat: Oropharynx is clear and moist.  R cerumen impaction  Eyes: Conjunctivae and EOM are normal. Pupils are equal, round, and reactive to light.  Neck: Normal range of motion. Neck supple. Carotid bruit is not present. No thyromegaly present.  Cardiovascular: Normal rate, regular rhythm, normal heart sounds and intact distal pulses.  Exam reveals no gallop and no friction rub.   No murmur heard. Pulmonary/Chest: Effort normal and breath sounds normal. She has no wheezes. She has no rales.  Abdominal: Soft. Bowel sounds are normal. She exhibits no distension and no mass. There is no tenderness. There is no rebound and no guarding.  Lymphadenopathy:    She has no cervical adenopathy.  Neurological: She is alert and oriented to person, place, and  time. No cranial nerve deficit.  Skin: Skin is warm and dry. No rash noted. She is not diaphoretic. No erythema. No pallor.  Psychiatric: She has a normal mood and affect. Her behavior is normal.   Results for orders placed or performed in visit on 07/22/15  Comprehensive metabolic panel  Result Value Ref Range   Sodium 140 135 - 146 mmol/L   Potassium 4.0 3.5 - 5.3 mmol/L   Chloride 102 98 - 110 mmol/L   CO2 29 20 - 31 mmol/L   Glucose, Bld 126 (H) 65 - 99 mg/dL   BUN 25 7 - 25 mg/dL   Creat 1.25 (H) 0.60 - 0.93 mg/dL   Total Bilirubin 0.6 0.2 - 1.2 mg/dL   Alkaline Phosphatase 76 33 - 130 U/L   AST 15 10 - 35 U/L   ALT 13 6 - 29 U/L   Total Protein 6.8 6.1 - 8.1 g/dL   Albumin 4.5 3.6 - 5.1 g/dL   Calcium 9.7 8.6 - 10.4 mg/dL  POCT glycosylated hemoglobin (Hb A1C)  Result Value Ref Range   Hemoglobin A1C 6.3        Assessment & Plan:   1. Palpitations   2. Cerumen impaction, right   3. Seasonal allergies   4. Osteopenia   5. Factor 5 Leiden mutation, heterozygous ([place])   6. Anxiety   7. Essential hypertension   8. Dyslipidemia   9. Controlled type 2 diabetes mellitus with diabetic nephropathy, without long-term current use of insulin ([place])   10. IBS (irritable bowel syndrome)    -stable. -has successfully weaned off of anxiety medications; emotionally doing well; continue to monitor mood for next six months. -s/p R ear irrigation during visit. -obtain labs. -continue current medications. -s/p geriatric consultation due to unsteady gait; recommended to discontinue Benadryl and to exercise regularly.     Orders Placed This Encounter  Procedures  . CBC with Differential/Platelet    Standing Status: Future     Number of Occurrences:  Standing Expiration Date: 10/25/2016  . Comprehensive metabolic panel    Standing Status: Future     Number of Occurrences:      Standing Expiration Date: 10/25/2016  . Lipid panel    Standing Status: Future     Number of  Occurrences:      Standing Expiration Date: 10/25/2016  . Ear wax removal   No orders of the defined types were placed in this encounter.    No Follow-up on file.    Demonte Dobratz Elayne Guerin, M.D. [facility] [address] [place], [place]  [address] [phone] phone [phone] fax

## 2015-10-26 NOTE — Patient Instructions (Signed)
     IF you received an x-ray today, you will receive an invoice from [facility]. Please contact [facility] at [phone] with questions or concerns regarding your invoice.   IF you received labwork today, you will receive an invoice from [facility]. Please contact [facility] at [phone] with questions or concerns regarding your invoice.   Our billing staff will not be able to assist you with questions regarding bills from these companies.  You will be contacted with the lab results as soon as they are available. The fastest way to get your results is to activate your My Chart account. Instructions are located on the last page of this paperwork. If you have not heard from us regarding the results in 2 weeks, please contact this office.      

## 2015-11-12 ENCOUNTER — Other Ambulatory Visit (INDEPENDENT_AMBULATORY_CARE_PROVIDER_SITE_OTHER): Payer: Medicare Other

## 2015-11-12 DIAGNOSIS — E785 Hyperlipidemia, unspecified: Secondary | ICD-10-CM | POA: Diagnosis not present

## 2015-11-12 DIAGNOSIS — I10 Essential (primary) hypertension: Secondary | ICD-10-CM

## 2015-11-12 DIAGNOSIS — R002 Palpitations: Secondary | ICD-10-CM

## 2015-11-12 LAB — COMPREHENSIVE METABOLIC PANEL
ALT: 11 U/L (ref 6–29)
AST: 14 U/L (ref 10–35)
Albumin: 4.2 g/dL (ref 3.6–5.1)
Alkaline Phosphatase: 70 U/L (ref 33–130)
BUN: 22 mg/dL (ref 7–25)
CO2: 23 mmol/L (ref 20–31)
Calcium: 9.1 mg/dL (ref 8.6–10.4)
Chloride: 103 mmol/L (ref 98–110)
Creat: 1.25 mg/dL — ABNORMAL HIGH (ref 0.60–0.93)
Glucose, Bld: 95 mg/dL (ref 65–99)
Potassium: 4.1 mmol/L (ref 3.5–5.3)
Sodium: 139 mmol/L (ref 135–146)
Total Bilirubin: 0.5 mg/dL (ref 0.2–1.2)
Total Protein: 6.4 g/dL (ref 6.1–8.1)

## 2015-11-12 LAB — CBC WITH DIFFERENTIAL/PLATELET
Basophils Absolute: 83 cells/uL (ref 0–200)
Basophils Relative: 1 %
Eosinophils Absolute: 166 cells/uL (ref 15–500)
Eosinophils Relative: 2 %
HCT: 43.1 % (ref 35.0–45.0)
Hemoglobin: 14.9 g/dL (ref 11.7–15.5)
Lymphocytes Relative: 29 %
Lymphs Abs: 2407 cells/uL (ref 850–3900)
MCH: 28.6 pg (ref 27.0–33.0)
MCHC: 34.6 g/dL (ref 32.0–36.0)
MCV: 82.7 fL (ref 80.0–100.0)
MPV: 9.1 fL (ref 7.5–12.5)
Monocytes Absolute: 581 cells/uL (ref 200–950)
Monocytes Relative: 7 %
Neutro Abs: 5063 cells/uL (ref 1500–7800)
Neutrophils Relative %: 61 %
Platelets: 325 10*3/uL (ref 140–400)
RBC: 5.21 MIL/uL — ABNORMAL HIGH (ref 3.80–5.10)
RDW: 13.4 % (ref 11.0–15.0)
WBC: 8.3 10*3/uL (ref 3.8–10.8)

## 2015-11-12 LAB — LIPID PANEL
Cholesterol: 157 mg/dL (ref 125–200)
HDL: 41 mg/dL — ABNORMAL LOW (ref >=46)
LDL Cholesterol: 84 mg/dL (ref <130)
Total CHOL/HDL Ratio: 3.8 Ratio (ref <=5.0)
Triglycerides: 161 mg/dL — ABNORMAL HIGH (ref <150)
VLDL: 32 mg/dL — ABNORMAL HIGH (ref <30)

## 2015-11-18 ENCOUNTER — Ambulatory Visit (INDEPENDENT_AMBULATORY_CARE_PROVIDER_SITE_OTHER): Payer: Medicare Other | Admitting: Family Medicine

## 2015-11-18 DIAGNOSIS — E1121 Type 2 diabetes mellitus with diabetic nephropathy: Secondary | ICD-10-CM

## 2015-11-18 LAB — POCT GLYCOSYLATED HEMOGLOBIN (HGB A1C): Hemoglobin A1C: 5.8

## 2015-11-26 ENCOUNTER — Encounter: Payer: Self-pay | Admitting: Internal Medicine

## 2015-11-26 ENCOUNTER — Ambulatory Visit (INDEPENDENT_AMBULATORY_CARE_PROVIDER_SITE_OTHER): Payer: Medicare Other | Admitting: Internal Medicine

## 2015-11-26 DIAGNOSIS — E1121 Type 2 diabetes mellitus with diabetic nephropathy: Secondary | ICD-10-CM | POA: Diagnosis not present

## 2015-11-26 MED ORDER — CANAGLIFLOZIN 100 MG PO TABS: 100.0000 mg | ORAL_TABLET | Freq: Every day | ORAL | Status: DC

## 2015-11-26 MED ORDER — METFORMIN HCL ER 500 MG PO TB24: 500.0000 mg | ORAL_TABLET | Freq: Three times a day (TID) | ORAL | Status: DC

## 2015-11-26 MED ORDER — OXYBUTYNIN CHLORIDE ER 5 MG PO TB24: ORAL_TABLET | ORAL | Status: DC

## 2015-11-26 NOTE — Patient Instructions (Signed)
Please check sugars later in the day >> once a day, rotating check times.  Please let me know about the sugars in 2 weeks.  Try to switch to Metformin ER 500 mg 3x a day and ten decrease to 2x a day if necessary.  Please return in 3 months with your sugar log.

## 2015-11-26 NOTE — Progress Notes (Signed)
Patient ID: [name] [name], female   DOB: Nov 13, 1940, 75 y.o.   MRN: [id number]  HPI: [name] [name] is a 75 y.o.-year-old female, returning for follow-up for DM2, dx 2014, non-insulin-dependent, uncontrolled, with complications (mild CKD). Last visit 4 month ago.  Last hemoglobin A1c was: Lab Results  Component Value Date   HGBA1C 5.8 11/18/2015   HGBA1C 6.3 07/22/2015   HGBA1C 6.2* 02/25/2015   Pt is on a regimen of: - Invokana 100 mg in am - started 04/2014 - metformin 500 mg tid - started 04/2014  - Glipizide XL 5 mg daily, before breakfast  Pt checks her sugars 0-1x a day: - A.m.: 497-026 (later higher after reducing metformin) >> 107-154 >> 102-152 >> 99-140 >> 100-135 - 2 hours post breakfast: 264 >> n/c  - Before lunch: n/c >> 95-144 >> 126-133 >> n/c - 2 hours post lunch: n/c >> 85-111 >> 113-116, 140 >> n/c - before dinner: n/c >> 82-125 >> 69 x2, 96-113, 189 >> n/c - 2 hours post dinner: n/c  >> 101, 103 >> n/c - Bedtime: n/c >> 84-121 >> n/c  Pt's meals are: - Breakfast: apple + cottage cheese + whole wheat - Lunch: leftovers from dinner; sandwich; salad - Dinner: varies; some fast food - Snacks: veggies; chips Some diet sodas.  She exercises [facility] [employer] [facility] [facility] [employer] [facility], Tai Chi 1x a week.  - + CKD, last BUN/creatinine:  Lab Results  Component Value Date   BUN 22 11/12/2015   CREATININE 1.25* 11/12/2015  Not on an ACEI. - last set of lipids: Lab Results  Component Value Date   CHOL 157 11/12/2015   HDL 41* 11/12/2015   LDLCALC 84 11/12/2015   TRIG 161* 11/12/2015   CHOLHDL 3.8 11/12/2015  Not on a statin. - last eye exam was in 11/03/2014. No DR. She had cataract sx. Has an appt next week. - + numbness and tingling in her feet. 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 She used Lidocaine patches >> discontinued. We started PN cream , but this was expensive (93$) and did not work much >> Voltaren gel works a little better.  She has stool  incontinence that started ~ 1.5 years ago. Unclear if related to Metformin.   I reviewed pt's medications, allergies, PMH, social hx, family hx, and changes were documented in the history of present illness. Otherwise, unchanged from my initial visit note.  She is a friend of Dr. Milta Deiters.  ROS: Constitutional: no weight gain/loss, no fatigue, no subjective hyperthermia/hypothermia Eyes: no blurry vision, no xerophthalmia ENT: no sore throat, no nodules palpated in throat, no dysphagia/odynophagia, no hoarseness Cardiovascular: no CP/SOB/palpitations/leg swelling Respiratory: no cough/SOB Gastrointestinal: no N/ V/D/C/heartburn, + stool incontinence Musculoskeletal: no muscle/joint aches Skin: no rashes, no hair loss Neurological: no tremors/numbness/itching  PE: BP 118/64 mmHg  Pulse 83  Temp(Src) 98.1 F (36.7 C) (Oral)  Resp 12  Wt 168 lb (76.204 kg)  SpO2 97% Body mass index is 28.4 kg/(m^2). Wt Readings from Last 3 Encounters:  11/26/15 168 lb (76.204 kg)  10/26/15 164 lb 3.2 oz (74.481 kg)  07/29/15 176 lb 9.6 oz (80.105 kg)   Constitutional: overweight, in NAD Eyes: PERRLA, EOMI, no exophthalmos ENT: moist mucous membranes, no thyromegaly, no cervical lymphadenopathy Cardiovascular: RRR, No MRG Respiratory: CTA B Gastrointestinal: abdomen soft, NT, ND, BS+ Musculoskeletal: no deformities, strength intact in all 4 Skin: moist, warm, no rashes Neurological: + tremor with outstretched  hands, DTR normal in all 4  ASSESSMENT: 1. DM2, non-insulin-dependent, uncontrolled, with complications - mild CKD  PLAN:  1. Patient with now better controlled diabetes, on oral antidiabetic regimen with Invokana, Metformin, and Glipizide  >> sugars are great, but she only checks in am!Last HbA1c (5.8%).  - I advised her to start checking sugars later in the day and if lower >> we need to decrease Glipizide to 2.5 mg in am. - 2/2 stool incontinence, will switch to Metformin  ER - will continue the rest of the regimen: Patient Instructions  Please check sugars later in the day >> once a day, rotating check times.  Please let me know about the sugars in 2 weeks.  Try to switch to Metformin ER 500 mg 3x a day and ten decrease to 2x a day if necessary.  Please return in 3 months with your sugar log.   - continue checking sugars at different times of the day - check 1 times a day, rotating checks - advised for yearly eye exams - she needs a new exam - Return to clinic in 3 mo with sugar log

## 2015-12-08 NOTE — Progress Notes (Signed)
Lab visit only; provider did not evaluate patient.

## 2015-12-09 ENCOUNTER — Other Ambulatory Visit: Payer: Self-pay | Admitting: Internal Medicine

## 2015-12-09 ENCOUNTER — Other Ambulatory Visit: Payer: Self-pay | Admitting: Family Medicine

## 2016-01-19 ENCOUNTER — Telehealth: Payer: Self-pay

## 2016-01-19 NOTE — Telephone Encounter (Signed)
Pt called to report that the oxybutynin seems to be working well for her all night (she takes Qhs) and in the morning until about noon. Then she starts to have incontinence problems again. She is wondering if Dr Tamala Julian can increase the strength to see if that will help (or take BID, but is already an ER so not sure if ins will cover BID dosing at lower strength if higher strength is available). Dr Tamala Julian, please advise.  Also, pt had gotten one refill of xifaxan recently but when she went back for RF it needed a PA. See all notes pertaining to trying to get this covered last year and it was denied. FDA only approves for short term, and ins would only approve three 2 week courses of treatment in a lifetime. Since pt was able to get it filled recently though, I will attempt a PA on it for her.

## 2016-01-22 MED ORDER — OXYBUTYNIN CHLORIDE ER 10 MG PO TB24: ORAL_TABLET | ORAL | Status: DC

## 2016-01-22 NOTE — Telephone Encounter (Signed)
Call --- I increased oxybutynin dose to 10mg  daily.  Let's see if patient gets benefit from a higher dosage.

## 2016-01-24 NOTE — Telephone Encounter (Signed)
[facility] requesting PA on Xifaxan 550mg  - completed with CoverMyMeds

## 2016-01-26 NOTE — Telephone Encounter (Signed)
PA was approved for the xifaxan but only for a 2 wk period like last year. Notified pt of new strength oxybutynin and also of PA temp approval. Pt thanked [place].

## 2016-02-14 ENCOUNTER — Telehealth: Payer: Self-pay | Admitting: Internal Medicine

## 2016-02-14 NOTE — Telephone Encounter (Signed)
Pt is asking if she is to get an A1C before her appt next week

## 2016-02-14 NOTE — Telephone Encounter (Signed)
No, will get it point of care at the beginning of the appointment.

## 2016-02-15 ENCOUNTER — Telehealth: Payer: Self-pay

## 2016-02-15 NOTE — Telephone Encounter (Signed)
Called and spoke with husband. Advised to tell patient not to come early for A1C as we can do them while she is here in the office and get the results same day, reminded them of the appointment date and time. No other questions or concerns at this time.

## 2016-02-15 NOTE — Telephone Encounter (Signed)
Called pt to inform her of Dr. Arman Filter note, there was no answer, left message with info

## 2016-02-21 ENCOUNTER — Ambulatory Visit (INDEPENDENT_AMBULATORY_CARE_PROVIDER_SITE_OTHER): Payer: Medicare Other | Admitting: Internal Medicine

## 2016-02-21 ENCOUNTER — Encounter: Payer: Self-pay | Admitting: Internal Medicine

## 2016-02-21 VITALS — BP 126/88 | HR 74 | Wt 170.0 lb

## 2016-02-21 DIAGNOSIS — E1121 Type 2 diabetes mellitus with diabetic nephropathy: Secondary | ICD-10-CM | POA: Diagnosis not present

## 2016-02-21 LAB — POCT GLYCOSYLATED HEMOGLOBIN (HGB A1C): Hemoglobin A1C: 5.6

## 2016-02-21 NOTE — Patient Instructions (Addendum)
Please continue: - Invokana 100 mg in am - Metformin ER 500 mg 3x a day - Glipizide XL 5 mg daily, before breakfast  Check out the following websites: - fit21me.com - BuildDNA.es  Please come back for a follow-up appointment in 3 months with your sugar log.

## 2016-02-21 NOTE — Addendum Note (Signed)
Addended by: Verlin Grills T on: 02/21/2016 03:53 PM   Modules accepted: Orders

## 2016-02-21 NOTE — Progress Notes (Signed)
Patient ID: [name] [name], female   DOB: 05/31/1941, 75 y.o.   MRN: [id number]  HPI: [name] [name] is a 75 y.o.-year-old female, returning for follow-up for DM2, dx 2014, non-insulin-dependent, uncontrolled, with complications (mild CKD). Last visit 3 month ago.  Last hemoglobin A1c was: Lab Results  Component Value Date   HGBA1C 5.8 11/18/2015   HGBA1C 6.3 07/22/2015   HGBA1C 6.2 (H) 02/25/2015   Pt is on a regimen of: - Invokana 100 mg in am - started 04/2014 - Metformin ER 500 mg tid  - Glipizide XL 5 mg daily, before breakfast  Pt checks her sugars 0-1x a day: - A.m.: 916-384 (later higher after reducing metformin) >> 107-154 >> 102-152 >> 99-140 >> 100-135 >> 100-140 - 2 hours post breakfast: 264 >> n/c  - Before lunch: n/c >> 95-144 >> 126-133 >> n/c - 2 hours post lunch: n/c >> 85-111 >> 113-116, 140 >> n/c >> 100 - before dinner: n/c >> 82-125 >> 69 x2, 96-113, 189 >> n/c - 2 hours post dinner: n/c  >> 101, 103 >> n/c - Bedtime: n/c >> 84-121 >> n/c Lowest: 100 Highest: 140  Pt's meals are: - Breakfast: apple + cottage cheese + whole wheat - Lunch: leftovers from dinner; sandwich; salad - Dinner: varies; some fast food - Snacks: veggies; chips Some diet sodas.  [facility], Tai Chi 1x a week.  - + CKD, last BUN/creatinine:  Lab Results  Component Value Date   BUN 22 11/12/2015   CREATININE 1.25 (H) 11/12/2015  Not on an ACEI. - last set of lipids: Lab Results  Component Value Date   CHOL 157 11/12/2015   HDL 41 (L) 11/12/2015   LDLCALC 84 11/12/2015   TRIG 161 (H) 11/12/2015   CHOLHDL 3.8 11/12/2015  Not on a statin. - last eye exam was in 10/2015. No DR. She had cataract sx.  - + numbness and tingling in her feet. 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 She used Lidocaine patches >> discontinued. We started PN cream , but this was expensive (93$) and did not work much >> Voltaren gel works a little  better.  She has stool incontinence that started ~ 1.5 years ago. Unclear if related to Metformin.   I reviewed pt's medications, allergies, PMH, social hx, family hx, and changes were documented in the history of present illness. Otherwise, unchanged from my initial visit note.  She is a friend of Dr. Milta Deiters.  ROS: Constitutional: no weight gain/loss, no fatigue, no subjective hyperthermia/hypothermia Eyes: no blurry vision, no xerophthalmia ENT: no sore throat, no nodules palpated in throat, no dysphagia/odynophagia, no hoarseness Cardiovascular: no CP/SOB/palpitations/leg swelling Respiratory: no cough/SOB Gastrointestinal: no N/ V/D/C/heartburn Musculoskeletal: no muscle/joint aches Skin: no rashes, no hair loss Neurological: no tremors/numbness/itching  PE: BP 126/88   Pulse 74   Ht (P) 5' 4.5" (1.638 m)   Wt 170 lb (77.1 kg)   SpO2 98%   BMI (P) 28.73 kg/m  Body mass index is 28.73 kg/m (pended). Wt Readings from Last 3 Encounters:  02/21/16 170 lb (77.1 kg)  11/26/15 168 lb (76.2 kg)  10/26/15 164 lb 3.2 oz (74.5 kg)   Constitutional: overweight, in NAD, very slow to get on examiner's table Eyes: PERRLA, EOMI, no exophthalmos ENT: moist mucous membranes, no thyromegaly, no cervical lymphadenopathy Cardiovascular: RRR, No MRG Respiratory: CTA B Gastrointestinal: abdomen soft, NT, ND, BS+ Musculoskeletal: no  deformities, strength intact in all 4 Skin: moist, warm, no rashes Neurological: + tremor with outstretched hands, DTR normal in all 4  ASSESSMENT: 1. DM2, non-insulin-dependent, uncontrolled, with complications - mild CKD  PLAN:  1. Patient with now better controlled diabetes, on oral antidiabetic regimen with Invokana, Metformin ER, and Glipizide  >> sugars are good, but she only checks in am!  - 2/2 stool incontinence, we switched to Metformin ER at last visit >> this is better - husband is mentioning she eats many sweets >> discuss the need to a  healthier diet >> recommended 2 websites >> see below - offered to refer her to nutrition >> she refuses - will continue the rest of the regimen: Patient Instructions  Please continue: - Invokana 100 mg in am - Metformin ER 500 mg 3x a day - Glipizide XL 5 mg daily, before breakfast  Check out the following websites: - fit71m.com - cBuildDNA.es Please come back for a follow-up appointment in 3 months with your sugar log.  - continue checking sugars at different times of the day - check 1 times a day, rotating checks >> again advised her to also check later in the day especially as we will need to start de-escalating her regimen  - advised for yearly eye exams - she is UTD - checked HbA1c today: 5.6% (excellent) - Return to clinic in 3 mo with sugar log

## 2016-03-02 ENCOUNTER — Other Ambulatory Visit: Payer: Self-pay | Admitting: Internal Medicine

## 2016-03-24 ENCOUNTER — Ambulatory Visit (INDEPENDENT_AMBULATORY_CARE_PROVIDER_SITE_OTHER): Payer: Medicare Other

## 2016-03-24 DIAGNOSIS — Z23 Encounter for immunization: Secondary | ICD-10-CM

## 2016-05-02 ENCOUNTER — Ambulatory Visit (INDEPENDENT_AMBULATORY_CARE_PROVIDER_SITE_OTHER): Payer: Medicare Other | Admitting: Family Medicine

## 2016-05-02 ENCOUNTER — Encounter: Payer: Self-pay | Admitting: Family Medicine

## 2016-05-02 VITALS — BP 132/68 | HR 86 | Temp 98.3°F | Resp 16 | Ht 64.25 in | Wt 172.4 lb

## 2016-05-02 DIAGNOSIS — H532 Diplopia: Secondary | ICD-10-CM

## 2016-05-02 DIAGNOSIS — Z86010 Personal history of colonic polyps: Secondary | ICD-10-CM | POA: Diagnosis not present

## 2016-05-02 DIAGNOSIS — M47816 Spondylosis without myelopathy or radiculopathy, lumbar region: Secondary | ICD-10-CM

## 2016-05-02 DIAGNOSIS — J301 Allergic rhinitis due to pollen: Secondary | ICD-10-CM | POA: Diagnosis not present

## 2016-05-02 DIAGNOSIS — E2839 Other primary ovarian failure: Secondary | ICD-10-CM

## 2016-05-02 DIAGNOSIS — E1121 Type 2 diabetes mellitus with diabetic nephropathy: Secondary | ICD-10-CM

## 2016-05-02 DIAGNOSIS — I10 Essential (primary) hypertension: Secondary | ICD-10-CM

## 2016-05-02 DIAGNOSIS — D6851 Activated protein C resistance: Secondary | ICD-10-CM

## 2016-05-02 DIAGNOSIS — E785 Hyperlipidemia, unspecified: Secondary | ICD-10-CM

## 2016-05-02 DIAGNOSIS — K580 Irritable bowel syndrome with diarrhea: Secondary | ICD-10-CM

## 2016-05-02 DIAGNOSIS — M8589 Other specified disorders of bone density and structure, multiple sites: Secondary | ICD-10-CM | POA: Diagnosis not present

## 2016-05-02 DIAGNOSIS — F419 Anxiety disorder, unspecified: Secondary | ICD-10-CM

## 2016-05-02 DIAGNOSIS — C4490 Unspecified malignant neoplasm of skin, unspecified: Secondary | ICD-10-CM

## 2016-05-02 DIAGNOSIS — Z23 Encounter for immunization: Secondary | ICD-10-CM

## 2016-05-02 DIAGNOSIS — Z0000 Encounter for general adult medical examination without abnormal findings: Secondary | ICD-10-CM

## 2016-05-02 DIAGNOSIS — K219 Gastro-esophageal reflux disease without esophagitis: Secondary | ICD-10-CM

## 2016-05-02 LAB — POCT URINALYSIS DIP (MANUAL ENTRY)
Bilirubin, UA: NEGATIVE
Blood, UA: NEGATIVE
Glucose, UA: 500 — AB
Ketones, POC UA: NEGATIVE
Leukocytes, UA: NEGATIVE
Nitrite, UA: NEGATIVE
Protein Ur, POC: NEGATIVE
Spec Grav, UA: <=1.005
Urobilinogen, UA: 0.2
pH, UA: 5

## 2016-05-02 MED ORDER — FUROSEMIDE 20 MG PO TABS: ORAL_TABLET | ORAL | 1 refills | Status: DC

## 2016-05-02 MED ORDER — OXYBUTYNIN CHLORIDE ER 10 MG PO TB24: ORAL_TABLET | ORAL | 3 refills | Status: DC

## 2016-05-02 MED ORDER — FLUTICASONE PROPIONATE 50 MCG/ACT NA SUSP: 2.0000 | Freq: Every day | NASAL | 11 refills | Status: DC

## 2016-05-02 MED ORDER — TOPROL XL 100 MG PO TB24: 100.0000 mg | ORAL_TABLET | Freq: Two times a day (BID) | ORAL | 1 refills | Status: DC

## 2016-05-02 MED ORDER — MONTELUKAST SODIUM 10 MG PO TABS: 10.0000 mg | ORAL_TABLET | Freq: Every day | ORAL | 3 refills | Status: DC

## 2016-05-02 NOTE — Patient Instructions (Addendum)
   IF you received an x-ray today, you will receive an invoice from [facility]. Please contact [facility] at [phone] with questions or concerns regarding your invoice.   IF you received labwork today, you will receive an invoice from [facility]. Please contact [facility] at [phone] with questions or concerns regarding your invoice.   Our billing staff will not be able to assist you with questions regarding bills from these companies.  You will be contacted with the lab results as soon as they are available. The fastest way to get your results is to activate your My Chart account. Instructions are located on the last page of this paperwork. If you have not heard from us regarding the results in 2 weeks, please contact this office.    Keeping You Healthy  Get These Tests  Blood Pressure- Have your blood pressure checked by your healthcare provider at least once a year.  Normal blood pressure is 120/80.  Weight- Have your body mass index (BMI) calculated to screen for obesity.  BMI is a measure of body fat based on height and weight.  You can calculate your own BMI at [email or web address]/bmi/  Cholesterol- Have your cholesterol checked every year.  Diabetes- Have your blood sugar checked every year if you have high blood pressure, high cholesterol, a family history of diabetes or if you are overweight.  Pap Test - Have a pap test every 1 to 5 years if you have been sexually active.  If you are older than 65 and recent pap tests have been normal you may not need additional pap tests.  In addition, if you have had a hysterectomy  for benign disease additional pap tests are not necessary.  Mammogram-Yearly mammograms are essential for early detection of breast cancer  Screening for Colon Cancer- Colonoscopy starting at age 50. Screening may begin sooner depending on your family history and other health conditions.  Follow up colonoscopy  as directed by your Gastroenterologist.  Screening for Osteoporosis- Screening begins at age 65 with bone density scanning, sooner if you are at higher risk for developing Osteoporosis.  Get these medicines  Calcium with Vitamin D- Your body requires 1200-1500 mg of Calcium a day and 800-1000 IU of Vitamin D a day.  You can only absorb 500 mg of Calcium at a time therefore Calcium must be taken in 2 or 3 separate doses throughout the day.  Hormones- Hormone therapy has been associated with increased risk for certain cancers and heart disease.  Talk to your healthcare provider about if you need relief from menopausal symptoms.  Aspirin- Ask your healthcare provider about taking Aspirin to prevent Heart Disease and Stroke.  Get these Immuniztions  Flu shot- Every fall  Pneumonia shot- Once after the age of 65; if you are younger ask your healthcare provider if you need a pneumonia shot.  Tetanus- Every ten years.  Zostavax- Once after the age of 60 to prevent shingles.  Take these steps  Don't smoke- Your healthcare provider can help you quit. For tips on how to quit, ask your healthcare provider or go to www.smokefree.gov or call 1-800 QUIT-NOW.  Be physically active- Exercise 5 days a week for a minimum of 30 minutes.  If you are not already physically active, start slow and gradually work up to 30 minutes of moderate physical activity.  Try walking, dancing, bike riding, swimming, etc.  Eat a healthy diet- Eat a variety of healthy foods such as fruits, vegetables, whole   grains, low fat milk, low fat cheeses, yogurt, lean meats, chicken, fish, eggs, dried beans, tofu, etc.  For more information go to www.thenutritionsource.org  Dental visit- Brush and floss teeth twice daily; visit your dentist twice a year.  Eye exam- Visit your Optometrist or Ophthalmologist yearly.  Drink alcohol in moderation- Limit alcohol intake to one drink or less a day.  Never drink and  drive.  Depression- Your emotional health is as important as your physical health.  If you're feeling down or losing interest in things you normally enjoy, please talk to your healthcare provider.  Seat Belts- can save your life; always wear one  Smoke/Carbon Monoxide detectors- These detectors need to be installed on the appropriate level of your home.  Replace batteries at least once a year.  Violence- If anyone is threatening or hurting you, please tell your healthcare provider.  Living Will/ Health care power of attorney- Discuss with your healthcare provider and family. 

## 2016-05-02 NOTE — Progress Notes (Signed)
Subjective:    Patient ID: [name] [name], female    DOB: 1940/09/27, 75 y.o.   MRN: [id number]  05/02/2016  Annual Exam   HPI This 75 y.o. female presents for Annual Wellness Examination and Routine Physical Examination and follow-up of chronic medical conditions.  Last physical:  03/06/2011 Pap smear: n/a; hysterectomy for DUB; no malignancy.  Ovaries intact. Mammogram: s/p mastectomy Colonoscopy:  04/24/2011; received letter for repeat. Really does not want to repeat.  Refuses further screening.  Bone density: 08-08-2013 [facility] exam:  April 2017 [name] [name]. Dental exam:  Every six months.    Immunization History  Administered Date(s) Administered  . Influenza Split 03/19/2012, 03/19/2013  . Influenza,inj,Quad PF,36+ Mos 04/09/2014, 03/22/2015, 03/24/2016  . Pneumococcal Conjugate-13 11/05/2014  . Pneumococcal Polysaccharide-23 07/10/2004, 12/12/2010  . Td 07/10/2009  . Zoster 07/10/2006     Review of Systems  Constitutional: Negative for activity change, appetite change, chills, diaphoresis, fatigue, fever and unexpected weight change.  HENT: Negative for congestion, dental problem, drooling, ear discharge, ear pain, facial swelling, hearing loss, mouth sores, nosebleeds, postnasal drip, rhinorrhea, sinus pressure, sneezing, sore throat, tinnitus, trouble swallowing and voice change.   Eyes: Negative for photophobia, pain, discharge, redness, itching and visual disturbance.  Respiratory: Negative for apnea, cough, choking, chest tightness, shortness of breath, wheezing and stridor.   Cardiovascular: Negative for chest pain, palpitations and leg swelling.  Gastrointestinal: Negative for abdominal distention, abdominal pain, anal bleeding, blood in stool, constipation, diarrhea, nausea, rectal pain and vomiting.  Endocrine: Negative for cold intolerance, heat intolerance, polydipsia, polyphagia and polyuria.  Genitourinary: Negative for decreased urine volume, difficulty  urinating, dyspareunia, dysuria, enuresis, flank pain, frequency, genital sores, hematuria, menstrual problem, pelvic pain, urgency, vaginal bleeding, vaginal discharge and vaginal pain.       Nocturia x 0-1.  Urinary incontinence urge intermittent; wears pad.  One pad per day.  Musculoskeletal: Negative for arthralgias, back pain, gait problem, joint swelling, myalgias, neck pain and neck stiffness.  Skin: Negative for color change, pallor, rash and wound.  Allergic/Immunologic: Negative for environmental allergies, food allergies and immunocompromised state.  Neurological: Negative for dizziness, tremors, seizures, syncope, facial asymmetry, speech difficulty, weakness, light-headedness, numbness and headaches.  Hematological: Negative for adenopathy. Does not bruise/bleed easily.  Psychiatric/Behavioral: Negative for agitation, behavioral problems, confusion, decreased concentration, dysphoric mood, hallucinations, self-injury, sleep disturbance and suicidal ideas. The patient is not nervous/anxious and is not hyperactive.        Bedtime 11:00pm; wakes up around 8:00am.    Past Medical History:  Diagnosis Date  . Allergy    [name] [name]; allergy shots weekly.    . Anxiety   . Arthritis    DDD lumbar spine.    . Cancer ([place])    breast L x 2; skin basal cell carcinoma [name]).  . 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 Diabetes mellitus   . Hypertension   . IBS (irritable bowel syndrome)    diarrhea predominant.   Past Surgical History:  Procedure Laterality Date  . BREAST LUMPECTOMY  1996   L breast cancer  . MASTECTOMY  2002   Bilateral for breast cancer  . VAGINAL HYSTERECTOMY  1985   DUB; uterine fibroids; ovaries intact.   Allergies  Allergen Reactions  . Fruit & Vegetable Daily [Nutritional Supplements] Diarrhea  . Onion Other (See Comments)    indigestion  . Tree Extract Swelling  . Sulfa Drugs Cross Reactors Rash  All over the  body   Current Outpatient Prescriptions  Medication Sig Dispense Refill  . aspirin 81 MG tablet Take 81 mg by mouth daily.    Marland Kitchen azelastine (ASTELIN) 0.1 % nasal spray Place 1 spray into both nostrils 2 (two) times daily as needed for rhinitis or allergies.     . Blood Glucose Monitoring Suppl (ONE TOUCH ULTRA 2) W/DEVICE KIT Inject 1 application as directed as needed.    . canagliflozin (INVOKANA) 100 MG TABS tablet Take 1 tablet (100 mg total) by mouth daily. 90 tablet 1  . dicyclomine (BENTYL) 10 MG capsule TAKE 1 CAPSULE 4 TIMES DAILY (BEFORE MEALS AND AT BEDTIME). 120 capsule 5  . furosemide (LASIX) 20 MG tablet TAKE 1 OR 2 TABLETS DAILY AS DIRECTED. 180 tablet 1  . GLIPIZIDE XL 5 MG 24 hr tablet TAKE 1 TABLET EVERY DAY WITH BREAKFAST. 90 tablet 0  . glucose blood (ONETOUCH VERIO) test strip Use to test blood sugar 2 times daily as instructed. Dx: E11.29 100 each 3  . metFORMIN (GLUCOPHAGE-XR) 500 MG 24 hr tablet Take 1 tablet (500 mg total) by mouth 3 (three) times daily with meals. 270 tablet 1  . ONETOUCH DELICA LANCETS 59Y MISC Use to test blood sugar 2 times daily as instructed. Dx: E11.29 100 each 3  . oxybutynin (DITROPAN-XL) 10 MG 24 hr tablet Take 1 tablet daily 90 tablet 3  . rifaximin (XIFAXAN) 550 MG TABS tablet Take 1 tablet (550 mg total) by mouth 2 (two) times daily. (Patient taking differently: Take 550 mg by mouth as needed. ) 60 tablet 11  . TOPROL XL 100 MG 24 hr tablet Take 1 tablet (100 mg total) by mouth 2 (two) times daily with a meal. Take with or immediately following a meal. 180 tablet 1  . conjugated estrogens (PREMARIN) vaginal cream Place 1 Applicatorful vaginally daily. (Patient not taking: Reported on 05/02/2016) 42.5 g 12  . desloratadine (CLARINEX) 5 MG tablet Take 5 mg by mouth daily.    Marland Kitchen EPIPEN 2-PAK 0.3 MG/0.3ML SOAJ injection Inject 0.3 mLs as directed as needed. Reported on 10/26/2015    . fluticasone (FLONASE) 50 MCG/ACT nasal spray Place 2 sprays into  both nostrils daily. 16 g 11  . Misc Natural Products (TART CHERRY ADVANCED PO) Take 1 capsule by mouth daily.    . montelukast (SINGULAIR) 10 MG tablet Take 1 tablet (10 mg total) by mouth at bedtime. 90 tablet 3  . nystatin (MYCOSTATIN/NYSTOP) 100000 UNIT/GM POWD Apply 1 g topically 2 (two) times daily. (Patient not taking: Reported on 05/02/2016) 60 g 3   No current facility-administered medications for this visit.    Social History   Social History  . Marital status: Married    Spouse name: N/A  . Number of children: N/A  . Years of education: N/A   Occupational History  . retired    Social History Main Topics  . Smoking status: Never Smoker  . Smokeless tobacco: Never Used  . Alcohol use 0.0 oz/week     Comment: social  . Drug use: No  . Sexual activity: No   Other Topics Concern  . Not on file   Social History Narrative   Marital status: married x 17 years; second marriage      Children:  2 children; 3 grandchildren; no gg      Lives: with husband      Employment: retired in 2005; retired from 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.  Tobacco: never      Alcohol: rarely;       Exercise:  Silver Sneakers; Tai Chi once per week; exercises 3 days per week      ADLs: independent with ADLs; drives.  Husband does the cleaning and lifting groceries.      Advanced Directives: YES;  FULL CODE no prolonged measures.              Family History  Problem Relation Age of Onset  . Heart disease Mother   . Heart disease Father   . Heart disease Brother        Objective:    BP 132/68   Pulse 86   Temp 98.3 F (36.8 C) (Oral)   Resp 16   Ht 5' 4.25" (1.632 m)   Wt 172 lb 6.4 oz (78.2 kg)   SpO2 96%   BMI 29.36 kg/m  Physical Exam  Constitutional: She is oriented to person, place, and time. She appears well-developed and well-nourished. No distress.  HENT:  Head: Normocephalic and atraumatic.  Right Ear: External ear normal.  Left Ear: External ear  normal.  Nose: Nose normal.  Mouth/Throat: Oropharynx is clear and moist.  Eyes: Conjunctivae and EOM are normal. Pupils are equal, round, and reactive to light.  Neck: Normal range of motion and full passive range of motion without pain. Neck supple. No JVD present. Carotid bruit is not present. No thyromegaly present.  Cardiovascular: Normal rate, regular rhythm and normal heart sounds.  Exam reveals no gallop and no friction rub.   No murmur heard. Pulmonary/Chest: Effort normal and breath sounds normal. She has no wheezes. She has no rales.  Abdominal: Soft. Bowel sounds are normal. She exhibits no distension and no mass. There is no tenderness. There is no rebound and no guarding.  Musculoskeletal:       Right shoulder: Normal.       Left shoulder: Normal.       Cervical back: Normal.  Lymphadenopathy:    She has no cervical adenopathy.  Neurological: She is alert and oriented to person, place, and time. She has normal reflexes. No cranial nerve deficit. She exhibits normal muscle tone. Coordination normal.  Skin: Skin is warm and dry. No rash noted. She is not diaphoretic. No erythema. No pallor.  Psychiatric: She has a normal mood and affect. Her behavior is normal. Judgment and thought content normal.  Nursing note and vitals reviewed.  Results for orders placed or performed in visit on 02/21/16  POCT glycosylated hemoglobin (Hb A1C)  Result Value Ref Range   Hemoglobin A1C 5.6    Fall Risk  05/02/2016 10/26/2015 03/22/2015 10/22/2014 01/01/2014  Falls in the past year? Yes Yes Yes Yes No  Number falls in past yr: - 2 or more 1 1 -  Injury with Fall? - No - - -   Depression screen [facility] 2/9 05/02/2016 10/26/2015 03/22/2015 02/27/2015 10/22/2014  Decreased Interest 0 0 0 0 0  Down, Depressed, Hopeless 0 0 0 0 0  PHQ - 2 Score 0 0 0 0 0   Functional Status Survey: Is the patient deaf or have difficulty hearing?: No Does the patient have difficulty seeing, even when wearing  glasses/contacts?: No Does the patient have difficulty concentrating, remembering, or making decisions?: No Does the patient have difficulty walking or climbing stairs?: Yes ("some") Does the patient have difficulty dressing or bathing?: No Does the patient have difficulty doing errands alone such as visiting a doctor's office or shopping?:  No     Assessment & Plan:   1. Encounter for Medicare annual wellness exam   2. Routine physical examination   3. Essential hypertension   4. Chronic seasonal allergic rhinitis due to pollen   5. Gastroesophageal reflux disease without esophagitis   6. Irritable bowel syndrome with diarrhea   7. Controlled type 2 diabetes mellitus with diabetic nephropathy, without long-term current use of insulin ([place])   8. Osteopenia of multiple sites   9. Non-melanoma skin cancer   10. Factor 5 Leiden mutation, heterozygous ([place])   11. Spondylosis of lumbar region without myelopathy or radiculopathy   12. Anxiety   13. History of colonic polyps   14. Dyslipidemia   15. Vertical diplopia   16. Estrogen deficiency   17. Need for pneumococcal vaccination    -anticipatory guidance --- exercise, weight loss, ASA 73m daily, 12063mcalcium daily or 3 servings of dairy daily. -s/p hysterectomy; s/p B mastectomy. -colonoscopy UTD. -independent with ADLs; moderate fall risk; no evidence of depression; no hearing loss; FULL CODE. -known urge urinary incontinence; s/p physical therapy. -obtain labs for chronic disease states. -refills provided.   Orders Placed This Encounter  Procedures  . DG Bone Density    Standing Status:   Future    Standing Expiration Date:   07/02/2017    Scheduling Instructions:     [facility]    Order Specific Question:   Reason for Exam (SYMPTOM  OR DIAGNOSIS REQUIRED)    Answer:   estrogen deficiency    Order Specific Question:   Preferred imaging location?    Answer:   External  . Pneumococcal polysaccharide vaccine 23-valent greater  than or equal to 2yo subcutaneous/IM  . Microalbumin, urine  . POCT urinalysis dipstick  . EKG 12-Lead   Meds ordered this encounter  Medications  . fluticasone (FLONASE) 50 MCG/ACT nasal spray    Sig: Place 2 sprays into both nostrils daily.    Dispense:  16 g    Refill:  11  . furosemide (LASIX) 20 MG tablet    Sig: TAKE 1 OR 2 TABLETS DAILY AS DIRECTED.    Dispense:  180 tablet    Refill:  1  . montelukast (SINGULAIR) 10 MG tablet    Sig: Take 1 tablet (10 mg total) by mouth at bedtime.    Dispense:  90 tablet    Refill:  3  . oxybutynin (DITROPAN-XL) 10 MG 24 hr tablet    Sig: Take 1 tablet daily    Dispense:  90 tablet    Refill:  3  . TOPROL XL 100 MG 24 hr tablet    Sig: Take 1 tablet (100 mg total) by mouth 2 (two) times daily with a meal. Take with or immediately following a meal.    Dispense:  180 tablet    Refill:  1    Return in about 6 months (around 10/31/2016) for recheck hypertenison.   Kristi MaElayne GuerinM.D. [id number]rPerrisNC  [phone]

## 2016-05-03 LAB — MICROALBUMIN, URINE: Microalb, Ur: 0.2 mg/dL

## 2016-05-10 ENCOUNTER — Encounter: Payer: Self-pay | Admitting: Family Medicine

## 2016-05-19 ENCOUNTER — Telehealth: Payer: Self-pay

## 2016-05-19 NOTE — Telephone Encounter (Signed)
Pt says she got a bone density test done on 05/10/16 and would like a CB when her results are ready. Please advise at [phone]

## 2016-05-22 ENCOUNTER — Encounter: Payer: Self-pay | Admitting: Internal Medicine

## 2016-05-22 ENCOUNTER — Ambulatory Visit (INDEPENDENT_AMBULATORY_CARE_PROVIDER_SITE_OTHER): Payer: Medicare Other | Admitting: Internal Medicine

## 2016-05-22 VITALS — BP 130/76 | HR 80 | Temp 98.1°F | Ht 65.0 in | Wt 171.4 lb

## 2016-05-22 DIAGNOSIS — G629 Polyneuropathy, unspecified: Secondary | ICD-10-CM | POA: Insufficient documentation

## 2016-05-22 DIAGNOSIS — E1121 Type 2 diabetes mellitus with diabetic nephropathy: Secondary | ICD-10-CM | POA: Diagnosis not present

## 2016-05-22 LAB — POCT GLYCOSYLATED HEMOGLOBIN (HGB A1C): Hemoglobin A1C: 6

## 2016-05-22 MED ORDER — METFORMIN HCL ER 500 MG PO TB24: 500.0000 mg | ORAL_TABLET | Freq: Three times a day (TID) | ORAL | 1 refills | Status: DC

## 2016-05-22 NOTE — Patient Instructions (Signed)
Please continue: - Invokana 100 mg in am - Metformin ER 500 mg 3x a day - Glipizide XL 5 mg daily, before breakfast  Please come back for a follow-up appointment in 3 months with your sugar log.

## 2016-05-22 NOTE — Progress Notes (Signed)
Patient ID: [name] [name], female   DOB: 11-17-1940, 75 y.o.   MRN: [id number]  HPI: [name] [name] is a 75 y.o.-year-old female, returning for follow-up for DM2, dx 2014, non-insulin-dependent, uncontrolled, with complications (mild CKD). Last visit 3 month ago.  Last hemoglobin A1c was: Lab Results  Component Value Date   HGBA1C 5.6 02/21/2016   HGBA1C 5.8 11/18/2015   HGBA1C 6.3 07/22/2015   Pt is on a regimen of: - Invokana 100 mg in am - started 04/2014 - Metformin ER 500 mg tid  - Glipizide XL 5 mg daily, before breakfast  Pt checks her sugars 0-1x a day: - A.m.:  107-154 >> 102-152 >> 99-140 >> 100-135 >> 100-140 >> 117-135 - 2 hours post breakfast: 264 >> n/c >> 96, 139 - Before lunch: n/c >> 95-144 >> 126-133 >> n/c  - 2 hours post lunch: n/c >> 85-111 >> 113-116, 140 >> n/c >> 100 >> 95, 131, 187 (large lunch) - before dinner: n/c >> 82-125 >> 69 x2, 96-113, 189 >> n/c >> 93, 111 - 2 hours post dinner: n/c  >> 101, 103 >> n/c >> 140 - Bedtime: n/c >> 84-121 >> n/c >> 116-169 Lowest: 100 >> 93 Highest: 140 >> 187.  Pt's meals are: - Breakfast: apple + cottage cheese + whole wheat - Lunch: leftovers from dinner; sandwich; salad - Dinner: varies; some fast food - Snacks: veggies; chips Some diet sodas.  [facility], Tai Chi 1x a week.  - + CKD, last BUN/creatinine:  Lab Results  Component Value Date   BUN 22 11/12/2015   CREATININE 1.25 (H) 11/12/2015  Not on an ACEI. - last set of lipids: Lab Results  Component Value Date   CHOL 157 11/12/2015   HDL 41 (L) 11/12/2015   LDLCALC 84 11/12/2015   TRIG 161 (H) 11/12/2015   CHOLHDL 3.8 11/12/2015  Not on a statin. - last eye exam was in 10/2015. No DR. She had cataract sx.  - + numbness and tingling in her feet. 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  She used Lidocaine patches >> discontinued.  We started PN cream , but this was expensive (93$). Voltaren gel  works a little better. Tried Neurontin >> did not like it. She was reading about poss. SEs of Lyrica >> would not want to try it.  She had stool incontinence >> better after switching to Metformin ER.  I reviewed pt's medications, allergies, PMH, social hx, family hx, and changes were documented in the history of present illness. Otherwise, unchanged from my initial visit note.  She is a friend of Dr. Milta Deiters.  ROS: Constitutional: no weight gain/loss, no fatigue, no subjective hyperthermia/hypothermia Eyes: no blurry vision, no xerophthalmia ENT: no sore throat, no nodules palpated in throat, no dysphagia/odynophagia, no hoarseness Cardiovascular: no CP/SOB/palpitations/leg swelling Respiratory: no cough/SOB Gastrointestinal: no N/ V/D/C/heartburn Musculoskeletal: no muscle/joint aches Skin: no rashes, no hair loss Neurological: no tremors/numbness/itching  PE: BP 130/76 (BP Location: Right Arm, Patient Position: Sitting, Cuff Size: Normal)   Pulse 80   Temp 98.1 F (36.7 C) (Oral)   Ht '5\' 5"'  (1.651 m)   Wt 171 lb 6.4 oz (77.7 kg)   SpO2 95%   BMI 28.52 kg/m  Body mass index is 28.52 kg/m. Wt Readings from Last 3 Encounters:  05/22/16 171 lb 6.4 oz (77.7 kg)  05/02/16 172 lb 6.4 oz (78.2 kg)  02/21/16 170 lb (77.1 kg)   Constitutional: overweight, in NAD, very slow to get on examiner's table Eyes: PERRLA, EOMI, no exophthalmos ENT: moist mucous membranes, no thyromegaly, no cervical lymphadenopathy Cardiovascular: RRR, No MRG Respiratory: CTA B Gastrointestinal: abdomen soft, NT, ND, BS+ Musculoskeletal: no deformities, strength intact in all 4 Skin: moist, warm, no rashes Neurological: + tremor with outstretched hands, DTR normal in all 4  ASSESSMENT: 1. DM2, non-insulin-dependent, uncontrolled, with complications - mild CKD - PN  PLAN:  1. Patient with now well controlled diabetes, on oral antidiabetic regimen with Invokana, Metformin ER, and Glipizide   >> sugars are at or close to goal. - offered to refer her to nutrition >> she refused - will continue the rest of the regimen: Patient Instructions  Please continue: - Invokana 100 mg in am - Metformin ER 500 mg 3x a day - Glipizide XL 5 mg daily, before breakfast  Please come back for a follow-up appointment in 3 months with your sugar log.  - continue checking sugars at different times of the day - check 1 times a day, rotating checks - advised for yearly eye exams - she is UTD - checked HbA1c today: 6% (still very  good) - Return to clinic in 3 mo with sugar log   2. PN I advised her totry the following combination for neuropathy: - alpha-lipoic acid 600 mg twice a day - B complex 1 tablet once a day  [name], MD PhD [facility] Endocrinology

## 2016-05-22 NOTE — Telephone Encounter (Addendum)
Dr Tamala Julian, have you seen these results yet? I checked your box and don't see anything there. Can't find any results in EPIC?  [facility] to see if pt had test done there. She did and they will fax the report to Dr Tamala Julian for review. LMOM for pt to update her on status. Advised we will call her as soon as we receive results and Dr Tamala Julian has a chance to review.

## 2016-05-27 ENCOUNTER — Telehealth: Payer: Self-pay | Admitting: Radiology

## 2016-05-29 NOTE — Telephone Encounter (Signed)
This was completed. See notes under next ph message.

## 2016-05-29 NOTE — Telephone Encounter (Signed)
This was completed

## 2016-05-30 ENCOUNTER — Other Ambulatory Visit: Payer: Self-pay | Admitting: Internal Medicine

## 2016-05-30 ENCOUNTER — Other Ambulatory Visit: Payer: Self-pay | Admitting: Family Medicine

## 2016-05-30 NOTE — Telephone Encounter (Signed)
05/22/16 last ov and labs

## 2016-08-24 ENCOUNTER — Other Ambulatory Visit: Payer: Self-pay | Admitting: Internal Medicine

## 2016-08-28 ENCOUNTER — Ambulatory Visit (INDEPENDENT_AMBULATORY_CARE_PROVIDER_SITE_OTHER): Payer: Medicare Other | Admitting: Internal Medicine

## 2016-08-28 ENCOUNTER — Encounter: Payer: Self-pay | Admitting: Internal Medicine

## 2016-08-28 VITALS — BP 118/80 | HR 79 | Ht 64.5 in | Wt 171.0 lb

## 2016-08-28 DIAGNOSIS — E1121 Type 2 diabetes mellitus with diabetic nephropathy: Secondary | ICD-10-CM

## 2016-08-28 LAB — POCT GLYCOSYLATED HEMOGLOBIN (HGB A1C): Hemoglobin A1C: 6

## 2016-08-28 MED ORDER — CANAGLIFLOZIN 100 MG PO TABS: 100.0000 mg | ORAL_TABLET | Freq: Every day | ORAL | 3 refills | Status: DC

## 2016-08-28 MED ORDER — METFORMIN HCL ER 500 MG PO TB24: 500.0000 mg | ORAL_TABLET | Freq: Three times a day (TID) | ORAL | 3 refills | Status: DC

## 2016-08-28 MED ORDER — GLIPIZIDE ER 5 MG PO TB24: ORAL_TABLET | ORAL | 3 refills | Status: DC

## 2016-08-28 NOTE — Addendum Note (Signed)
Addended by: Caprice Beaver T on: 08/28/2016 03:58 PM   Modules accepted: Orders

## 2016-08-28 NOTE — Progress Notes (Signed)
Patient ID: [name] [name], female   DOB: 11-18-1940, 76 y.o.   MRN: [id number]  HPI: [name] [name] is a 76 y.o.-year-old female, returning for follow-up for DM2, dx 2014, non-insulin-dependent, uncontrolled, with complications (mild CKD). Last visit 3 month ago.  Last hemoglobin A1c was: Lab Results  Component Value Date   HGBA1C 6.0 05/22/2016   HGBA1C 5.6 02/21/2016   HGBA1C 5.8 11/18/2015   Pt is on a regimen of: - Invokana 100 mg in am - started 04/2014 - Metformin ER 500 mg tid  - Glipizide XL 5 mg daily, before breakfast  Pt checks her sugars 0-1x a day: - A.m.:  107-154 >> 102-152 >> 99-140 >> 100-135 >> 100-140 >> 117-135 >> 106-125 - 2 hours post breakfast: 264 >> n/c >> 96, 139 >> n/c - Before lunch: n/c >> 95-144 >> 126-133 >> n/c  - 2 hours post lunch:  85-111 >> 113-116, 140 >> n/c >> 100 >> 95, 131, 187 (large lunch) >> 83 - before dinner: n/c >> 82-125 >> 69 x2, 96-113, 189 >> n/c >> 93, 111 >> 79-153 - 2 hours post dinner: n/c  >> 101, 103 >> n/c >> 140 >> n/c - Bedtime: n/c >> 84-121 >> n/c >> 116-169 >> n/c Lowest: 100 >> 93 >> 79 (delayed meal) Highest: 140 >> 187 >> 153  Pt's meals are: - Breakfast: apple + cottage cheese + whole wheat - Lunch: leftovers from dinner; sandwich; salad - Dinner: varies; some fast food - Snacks: veggies; chips Some diet sodas.  She continues to [facility] [employer] [facility], Tai Chi 1x a week.  - + CKD, last BUN/creatinine:  Lab Results  Component Value Date   BUN 22 11/12/2015   CREATININE 1.25 (H) 11/12/2015  Not on an ACEI. - last set of lipids: Lab Results  Component Value Date   CHOL 157 11/12/2015   HDL 41 (L) 11/12/2015   LDLCALC 84 11/12/2015   TRIG 161 (H) 11/12/2015   CHOLHDL 3.8 11/12/2015  Not on a statin. - last eye exam was in 10/2015. No DR. She had cataract sx.  - + numbness and tingling in her feet. 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  She used Lidocaine patches >>  discontinued.  We started PN cream , but this was expensive (93$). Voltaren gel works a little better. Tried Neurontin >> did not like it. She was reading about poss. SEs of Lyrica >> would not want to try it.  She had stool incontinence >> better after switching to Metformin ER.  I reviewed pt's medications, allergies, PMH, social hx, family hx, and changes were documented in the history of present illness. Otherwise, unchanged from my initial visit note.  She is a friend of Dr. Milta Deiters.  ROS: Constitutional: no weight gain/loss, no fatigue, no subjective hyperthermia/hypothermia Eyes: no blurry vision, no xerophthalmia ENT: no sore throat, no nodules palpated in throat, no dysphagia/odynophagia, no hoarseness Cardiovascular: no CP/SOB/palpitations/leg swelling Respiratory: no cough/SOB Gastrointestinal: no N/ V/D/C/heartburn Musculoskeletal: no muscle/joint aches Skin: no rashes, no hair loss Neurological: no tremors/numbness/itching  PE: BP 118/80 (BP Location: Left Arm, Patient Position: Sitting)   Pulse 79   Ht 5' 4.5" (1.638 m)   Wt 171 lb (77.6 kg)   SpO2 96%   BMI 28.90 kg/m  Body mass index is 28.9 kg/m. Wt Readings from Last 3 Encounters:  08/28/16 171 lb (77.6 kg)  05/22/16 171 lb 6.4 oz (77.7  kg)  05/02/16 172 lb 6.4 oz (78.2 kg)   Constitutional: overweight, in NAD, very slow to get on examiner's table Eyes: PERRLA, EOMI, no exophthalmos ENT: moist mucous membranes, no thyromegaly, no cervical lymphadenopathy Cardiovascular: RRR, No MRG Respiratory: CTA B Gastrointestinal: abdomen soft, NT, ND, BS+ Musculoskeletal: no deformities, strength intact in all 4 Skin: moist, warm, no rashes Neurological: + tremor with outstretched hands, DTR normal in all 4  ASSESSMENT: 1. DM2, non-insulin-dependent, uncontrolled, with complications - mild CKD - PN  - offered to refer her to nutrition >> she refused  PLAN:  1. Patient with now well controlled  diabetes, on oral antidiabetic regimen with Invokana, Metformin ER, and Glipizide  >> sugars are at or close to goal, but she did not check many since last visit >> advised to check some more. - no need to change regimen at this visit as HbA1c today: 6% (still very  good) - will continue the rest of the regimen: Patient Instructions  Please continue: - Invokana 100 mg in am - Metformin ER 500 mg 3x a day - Glipizide XL 5 mg daily, before breakfast  Please come back for a follow-up appointment in 3 months with your sugar log.  - continue checking sugars at different times of the day - check 1 times a day, rotating checks - advised for yearly eye exams - she is UTD - Return to clinic in 3 mo with sugar log   2. PN On the following combination for neuropathy: - alpha-lipoic acid 600 mg twice a day - B complex 1 tablet once a day  [name], MD PhD [facility] Endocrinology

## 2016-08-28 NOTE — Patient Instructions (Signed)
Please continue: - Invokana 100 mg in am - Metformin ER 500 mg 3x a day - Glipizide XL 5 mg daily, before breakfast  Please come back for a follow-up appointment in 4 months with your sugar log.

## 2016-09-07 ENCOUNTER — Other Ambulatory Visit: Payer: Self-pay | Admitting: Family Medicine

## 2016-09-07 DIAGNOSIS — N952 Postmenopausal atrophic vaginitis: Secondary | ICD-10-CM

## 2016-09-09 NOTE — Telephone Encounter (Signed)
04/2016 last ov 

## 2016-10-05 ENCOUNTER — Ambulatory Visit (INDEPENDENT_AMBULATORY_CARE_PROVIDER_SITE_OTHER): Payer: Medicare Other | Admitting: Physician Assistant

## 2016-10-05 VITALS — BP 131/63 | HR 85 | Temp 98.6°F | Resp 18 | Ht 64.5 in | Wt 169.6 lb

## 2016-10-05 DIAGNOSIS — J040 Acute laryngitis: Secondary | ICD-10-CM

## 2016-10-05 DIAGNOSIS — R0981 Nasal congestion: Secondary | ICD-10-CM | POA: Diagnosis not present

## 2016-10-05 DIAGNOSIS — R070 Pain in throat: Secondary | ICD-10-CM

## 2016-10-05 DIAGNOSIS — J069 Acute upper respiratory infection, unspecified: Secondary | ICD-10-CM | POA: Diagnosis not present

## 2016-10-05 LAB — POCT RAPID STREP A (OFFICE): Rapid Strep A Screen: NEGATIVE

## 2016-10-05 MED ORDER — GUAIFENESIN ER 1200 MG PO TB12: 1.0000 | ORAL_TABLET | Freq: Two times a day (BID) | ORAL | 1 refills | Status: DC | PRN

## 2016-10-05 MED ORDER — OXYBUTYNIN CHLORIDE ER 15 MG PO TB24: 15.0000 mg | ORAL_TABLET | Freq: Every day | ORAL | 0 refills | Status: DC

## 2016-10-05 NOTE — Patient Instructions (Addendum)
Continue taking using the allergy oral medication and your flonase. I would also like you to take mucinex at this time.   This is likely a viral infection. If your symptoms do not improve, please contact in 7 days   Upper Respiratory Infection, Adult Most upper respiratory infections (URIs) are caused by a virus. A URI affects the nose, throat, and upper air passages. The most common type of URI is often called "the common cold." Follow these instructions at home:  Take medicines only as told by your doctor.  Gargle warm saltwater or take cough drops to comfort your throat as told by your doctor.  Use a warm mist humidifier or inhale steam from a shower to increase air moisture. This may make it easier to breathe.  Drink enough fluid to keep your pee (urine) clear or pale yellow.  Eat soups and other clear broths.  Have a healthy diet.  Rest as needed.  Go back to work when your fever is gone or your doctor says it is okay.  You may need to stay home longer to avoid giving your URI to others.  You can also wear a face mask and wash your hands often to prevent spread of the virus.  Use your inhaler more if you have asthma.  Do not use any tobacco products, including cigarettes, chewing tobacco, or electronic cigarettes. If you need help quitting, ask your doctor. Contact a doctor if:  You are getting worse, not better.  Your symptoms are not helped by medicine.  You have chills.  You are getting more short of breath.  You have brown or red mucus.  You have yellow or brown discharge from your nose.  You have pain in your face, especially when you bend forward.  You have a fever.  You have puffy (swollen) neck glands.  You have pain while swallowing.  You have white areas in the back of your throat. Get help right away if:  You have very bad or constant:  Headache.  Ear pain.  Pain in your forehead, behind your eyes, and over your cheekbones (sinus  pain).  Chest pain.  You have long-lasting (chronic) lung disease and any of the following:  Wheezing.  Long-lasting cough.  Coughing up blood.  A change in your usual mucus.  You have a stiff neck.  You have changes in your:  Vision.  Hearing.  Thinking.  Mood. This information is not intended to replace advice given to you by your health care provider. Make sure you discuss any questions you have with your health care provider. Document Released: 12/13/2007 Document Revised: 02/27/2016 Document Reviewed: 10/01/2013 Elsevier Interactive Patient Education  2017 [employer].    IF you received an x-ray today, you will receive an invoice from [facility]. Please contact [facility] at [phone] with questions or concerns regarding your invoice.   IF you received labwork today, you will receive an invoice from [place]. Please contact [facility] at [phone] with questions or concerns regarding your invoice.   Our billing staff will not be able to assist you with questions regarding bills from these companies.  You will be contacted with the lab results as soon as they are available. The fastest way to get your results is to activate your My Chart account. Instructions are located on the last page of this paperwork. If you have not heard from [place] regarding the results in 2 weeks, please contact this office.

## 2016-10-05 NOTE — Progress Notes (Signed)
PRIMARY CARE AT [facility] [address] [place] [address] [phone]  Date:  10/05/2016   Name:  JENAVIE STANCZAK   DOB:  1941-04-14   MRN:  [id number]  PCP:  Reginia Forts, MD    History of Present Illness:  KEILLY FATULA is a 76 y.o. female patient who presents to PCP with  Chief Complaint  Patient presents with  . swollen tonsils    x 3days  . Sore Throat  . Cough     Patient reports 2 days of swollen tonsils that are sore, and a mildly sore throat.  She has had no fever.  She is coughing, but feels post nasal drainage secondary to her allergies.  She has been actively taking allegra and flonase at this time.  She took two tylenols for her pain the first day of her symptoms, but nothing else at this time. She has found that around midday the overactive bladder is more appreciated.  She has no known side effects to the oxybutynin 69m which she takes every day.    Patient Active Problem List   Diagnosis Date Noted  . Peripheral polyneuropathy ([facility] 05/22/2016  . Controlled diabetes mellitus with diabetic nephropathy, without long-term current use of insulin ([facility] 07/29/2015  . IBS (irritable bowel syndrome) 12/01/2014  . Vertical diplopia 11/17/2014  . Non-melanoma skin cancer 05/13/2014  . Seasonal allergies 09/12/2011  . Anxiety 09/12/2011  . HTN (hypertension) 09/12/2011  . Breast cancer ([facility] [place] 09/12/2011  . GERD (gastroesophageal reflux disease) 09/12/2011  . Dyslipidemia 09/12/2011  . Osteopenia 09/12/2011  . DJD (degenerative joint disease) 09/12/2011  . Factor 5 Leiden mutation, heterozygous ([facility] 09/12/2011  . History of colonic polyps 09/12/2011    Past Medical History:  Diagnosis Date  . Allergy    VHarold Hedge allergy shots weekly.    . Anxiety   . Arthritis    DDD lumbar spine.    . Cancer ([facility]    breast L x 2; skin basal cell carcinoma ([name].  . 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 Diabetes mellitus    . Hypertension   . IBS (irritable bowel syndrome)    diarrhea predominant.    Past Surgical History:  Procedure Laterality Date  . BREAST LUMPECTOMY  1996   L breast cancer  . MASTECTOMY  2002   Bilateral for breast cancer  . VAGINAL HYSTERECTOMY  1985   DUB; uterine fibroids; ovaries intact.    Social History  Substance Use Topics  . Smoking status: Never Smoker  . Smokeless tobacco: Never Used  . Alcohol use 0.0 oz/week     Comment: social    Family History  Problem Relation Age of Onset  . Heart disease Mother   . Heart disease Father   . Heart disease Brother     Allergies  Allergen Reactions  . Fruit & Vegetable Daily [Nutritional Supplements] Diarrhea  . Onion Other (See Comments)    indigestion  . Tree Extract Swelling  . Sulfa Drugs Cross Reactors Rash    All over the body    Medication list has been reviewed and updated.  Current Outpatient Prescriptions on File Prior to Visit  Medication Sig Dispense Refill  . aspirin 81 MG tablet Take 81 mg by mouth daily.    . canagliflozin (INVOKANA) 100 MG TABS tablet Take 1 tablet (100 mg total) by mouth daily. 90 tablet 3  . conjugated estrogens (PREMARIN) vaginal cream Place vaginally every  other day. 30 g 6  . dicyclomine (BENTYL) 10 MG capsule TAKE 1 CAPSULE 4 TIMES DAILY (BEFORE MEALS AND AT BEDTIME). (Patient taking differently: TAKE 1 CAPSULE 4 TIMES DAILY ( AT BEDTIME).) 120 capsule 1  . fluticasone (FLONASE) 50 MCG/ACT nasal spray Place 2 sprays into both nostrils daily. 16 g 11  . furosemide (LASIX) 20 MG tablet TAKE 1 OR 2 TABLETS DAILY AS DIRECTED. 180 tablet 1  . glipiZIDE (GLIPIZIDE XL) 5 MG 24 hr tablet TAKE 1 TABLET EVERY DAY WITH BREAKFAST. 90 tablet 3  . glucose blood (ONETOUCH VERIO) test strip Use to test blood sugar 2 times daily as instructed. Dx: E11.29 100 each 3  . metFORMIN (GLUCOPHAGE-XR) 500 MG 24 hr tablet Take 1 tablet (500 mg total) by mouth 3 (three) times daily with meals. 270  tablet 3  . montelukast (SINGULAIR) 10 MG tablet Take 1 tablet (10 mg total) by mouth at bedtime. 90 tablet 3  . ONETOUCH DELICA LANCETS 25K MISC Use to test blood sugar 2 times daily as instructed. Dx: E11.29 100 each 3  . oxybutynin (DITROPAN-XL) 10 MG 24 hr tablet Take 1 tablet daily 90 tablet 3  . rifaximin (XIFAXAN) 550 MG TABS tablet Take 1 tablet (550 mg total) by mouth 2 (two) times daily. (Patient taking differently: Take 550 mg by mouth as needed. ) 60 tablet 11  . TOPROL XL 100 MG 24 hr tablet TAKE 1 TABLET TWICE DAILY WITH FOOD. 180 tablet 1  . Blood Glucose Monitoring Suppl (ONE TOUCH ULTRA 2) W/DEVICE KIT Inject 1 application as directed as needed.    . Misc Natural Products (TART CHERRY ADVANCED PO) Take 1 capsule by mouth daily.     No current facility-administered medications on file prior to visit.     ROS ROS otherwise unremarkable unless listed above.  Physical Examination: BP 131/63   Pulse 85   Temp 98.6 F (37 C) (Oral)   Resp 18   Ht 5' 4.5" (1.638 m)   Wt 169 lb 9.6 oz (76.9 kg)   SpO2 94%   BMI 28.66 kg/m  Ideal Body Weight: Weight in (lb) to have BMI = 25: 147.6  Physical Exam  Constitutional: She is oriented to person, place, and time. She appears well-developed and well-nourished. No distress.  HENT:  Head: Normocephalic and atraumatic.  Right Ear: External ear normal.  Left Ear: External ear normal.  Mouth/Throat: No oropharyngeal exudate, posterior oropharyngeal edema or posterior oropharyngeal erythema.  Eyes: Conjunctivae and EOM are normal. Pupils are equal, round, and reactive to light.  Cardiovascular: Normal rate.   Pulmonary/Chest: Effort normal. No respiratory distress.  Neurological: She is alert and oriented to person, place, and time.  Skin: She is not diaphoretic.  Psychiatric: She has a normal mood and affect. Her behavior is normal.    Results for orders placed or performed in visit on 10/05/16  Rapid Strep A  Result Value Ref  Range   Rapid Strep A Screen Negative Negative     Assessment and Plan: OCEAN KEARLEY is a 76 y.o. female who is here today for throat pain. 7 days without symptom improvement, please prescribe cefdinir   Acute upper respiratory infection  Throat pain - Plan: Culture, Group A Strep  Laryngitis - Plan: Rapid Strep A, Culture, Group A Strep  Congestion of nasal sinus  Ivar Drape, PA-C [facility] 3/29/20186:53 PM

## 2016-10-08 LAB — CULTURE, GROUP A STREP: Strep A Culture: NEGATIVE

## 2016-10-09 ENCOUNTER — Telehealth: Payer: Self-pay | Admitting: Physician Assistant

## 2016-10-09 MED ORDER — CEFDINIR 300 MG PO CAPS
300.0000 mg | ORAL_CAPSULE | Freq: Two times a day (BID) | ORAL | 0 refills | Status: DC
Start: 2016-10-09 — End: 2016-11-01

## 2016-10-09 NOTE — Telephone Encounter (Signed)
Cefdinir sent to pharmacy per Stephanie's note.  Please let her know it is waiting. Philis Fendt, MS, PA-C 3:46 PM, 10/09/2016

## 2016-10-09 NOTE — Telephone Encounter (Signed)
Patient came in to see Colletta Maryland a few days ago and was told to call us back if she wasn't feeling any better in the next few days. So she was calling to see if Colletta Maryland could give her something to help her feel better.  Her call back number is [phone]

## 2016-10-09 NOTE — Telephone Encounter (Signed)
l/m with clarks note

## 2016-10-09 NOTE — Telephone Encounter (Signed)
See ov note, this is day 5, can we rx as described in note?

## 2016-11-01 ENCOUNTER — Ambulatory Visit (INDEPENDENT_AMBULATORY_CARE_PROVIDER_SITE_OTHER): Payer: Medicare Other | Admitting: Family Medicine

## 2016-11-01 ENCOUNTER — Encounter: Payer: Self-pay | Admitting: Family Medicine

## 2016-11-01 VITALS — BP 124/72 | HR 82 | Temp 98.3°F | Resp 16 | Ht 64.5 in | Wt 174.0 lb

## 2016-11-01 DIAGNOSIS — K219 Gastro-esophageal reflux disease without esophagitis: Secondary | ICD-10-CM

## 2016-11-01 DIAGNOSIS — E119 Type 2 diabetes mellitus without complications: Secondary | ICD-10-CM | POA: Diagnosis not present

## 2016-11-01 DIAGNOSIS — E785 Hyperlipidemia, unspecified: Secondary | ICD-10-CM

## 2016-11-01 DIAGNOSIS — K580 Irritable bowel syndrome with diarrhea: Secondary | ICD-10-CM

## 2016-11-01 DIAGNOSIS — J301 Allergic rhinitis due to pollen: Secondary | ICD-10-CM

## 2016-11-01 DIAGNOSIS — I10 Essential (primary) hypertension: Secondary | ICD-10-CM | POA: Diagnosis not present

## 2016-11-01 DIAGNOSIS — Z23 Encounter for immunization: Secondary | ICD-10-CM | POA: Diagnosis not present

## 2016-11-01 MED ORDER — ZOSTER VAC RECOMB ADJUVANTED 50 MCG/0.5ML IM SUSR: 0.5000 mL | Freq: Once | INTRAMUSCULAR | 1 refills | Status: AC

## 2016-11-01 NOTE — Patient Instructions (Addendum)
  ZANTAC/RANITIDINE 150MG  ONE TABLET TWICE DAILY FOR HEARTBURN. PEPCID/FAMOTADINE 20MG  ONE TABLET TWICE DAILY FOR HEARTBURN. Food Choices for Gastroesophageal Reflux Disease, Adult When you have gastroesophageal reflux disease (GERD), the foods you eat and your eating habits are very important. Choosing the right foods can help ease your discomfort. What guidelines do I need to follow?  Choose fruits, vegetables, whole grains, and low-fat dairy products.  Choose low-fat meat, fish, and poultry.  Limit fats such as oils, salad dressings, butter, nuts, and avocado.  Keep a food diary. This helps you identify foods that cause symptoms.  Avoid foods that cause symptoms. These may be different for everyone.  Eat small meals often instead of 3 large meals a day.  Eat your meals slowly, in a place where you are relaxed.  Limit fried foods.  Cook foods using methods other than frying.  Avoid drinking alcohol.  Avoid drinking large amounts of liquids with your meals.  Avoid bending over or lying down until 2-3 hours after eating. What foods are not recommended? These are some foods and drinks that may make your symptoms worse: Vegetables  Tomatoes. Tomato juice. Tomato and spaghetti sauce. Chili peppers. Onion and garlic. Horseradish. Fruits  Oranges, grapefruit, and lemon (fruit and juice). Meats  High-fat meats, fish, and poultry. This includes hot dogs, ribs, ham, sausage, salami, and bacon. Dairy  Whole milk and chocolate milk. Sour cream. Cream. Butter. Ice cream. Cream cheese. Drinks  Coffee and tea. Bubbly (carbonated) drinks or energy drinks. Condiments  Hot sauce. Barbecue sauce. Sweets/Desserts  Chocolate and cocoa. Donuts. Peppermint and spearmint. Fats and Oils  High-fat foods. This includes [place] fries and potato chips. Other  Vinegar. Strong spices. This includes black pepper, white pepper, red pepper, cayenne, curry powder, cloves, ginger, and chili powder. The  items listed above may not be a complete list of foods and drinks to avoid. Contact your dietitian for more information.  This information is not intended to replace advice given to you by your health care provider. Make sure you discuss any questions you have with your health care provider. Document Released: 12/26/2011 Document Revised: 12/02/2015 Document Reviewed: 04/30/2013 Elsevier Interactive Patient Education  2017 [employer].    IF you received an x-ray today, you will receive an invoice from [facility] Rad[facility]. Please contact [facility] Radiology at [phone] with questions or concerns regarding your invoice.   IF you received labwork today, you will receive an invoice from [place]. Please contact [facility] at [phone] with questions or concerns regarding your invoice.   Our billing staff will not be able to assist you with questions regarding bills from these companies.  You will be contacted with the lab results as soon as they are available. The fastest way to get your results is to activate your My Chart account. Instructions are located on the last page of this paperwork. If you have not heard from [place] regarding the results in 2 weeks, please contact this office.

## 2016-11-01 NOTE — Progress Notes (Signed)
Subjective:    Patient ID: [name] [name], female    DOB: [birth year], 76 y.o.   MRN: [id number]  11/01/2016  Follow-up and Hypertension (states everything is ok)   HPI This 76 y.o. female presents for six month follow-up of hypertension, hypercholesterolemia, urinary incontinence.  Patient reports good compliance with medication, good tolerance to medication, and good symptom control.  Not checking BP at home.   Taking Metoprolol and Lasix daily.   Taking Bentyl one daily; moderate results.   Taking oxybutynin; working OK; occurs around lunch time.  No caffeine.  No coffee.  Drinks tea.  Sips on something all day.    Immunization History  Administered Date(s) Administered  . Influenza Split 03/19/2012, 03/19/2013  . Influenza,inj,Quad PF,36+ Mos 04/09/2014, 03/22/2015, 03/24/2016  . Pneumococcal Conjugate-13 11/05/2014  . Pneumococcal Polysaccharide-23 07/10/2004, 12/12/2010, 05/02/2016  . Td 07/10/2009  . Zoster 07/10/2006   BP Readings from Last 3 Encounters:  11/01/16 124/72  10/05/16 131/63  08/28/16 118/80   Wt Readings from Last 3 Encounters:  11/01/16 174 lb (78.9 kg)  10/05/16 169 lb 9.6 oz (76.9 kg)  08/28/16 171 lb (77.6 kg)     Review of Systems  Constitutional: Negative for chills, diaphoresis, fatigue and fever.  Eyes: Negative for visual disturbance.  Respiratory: Negative for cough and shortness of breath.   Cardiovascular: Negative for chest pain, palpitations and leg swelling.  Gastrointestinal: Negative for abdominal pain, constipation, diarrhea, nausea and vomiting.  Endocrine: Negative for cold intolerance, heat intolerance, polydipsia, polyphagia and polyuria.  Neurological: Negative for dizziness, tremors, seizures, syncope, facial asymmetry, speech difficulty, weakness, light-headedness, numbness and headaches.    Past Medical History:  Diagnosis Date  . Allergy    [name]; allergy shots weekly.    . Anxiety   . Arthritis    DDD lumbar  spine.    . Cancer ([facility])    breast L x 2; skin basal cell carcinoma [name]).  . 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 Diabetes mellitus   . Hypertension   . IBS (irritable bowel syndrome)    diarrhea predominant.   Past Surgical History:  Procedure Laterality Date  . BREAST LUMPECTOMY  1996   L breast cancer  . MASTECTOMY  2002   Bilateral for breast cancer  . VAGINAL HYSTERECTOMY  1985   DUB; uterine fibroids; ovaries intact.   Allergies  Allergen Reactions  . Fruit & Vegetable Daily [Nutritional Supplements] Diarrhea  . Onion Other (See Comments)    indigestion  . Tree Extract Swelling  . Sulfa Drugs Cross Reactors Rash    All over the body    Social History   Social History  . Marital status: Married    Spouse name: N/A  . Number of children: N/A  . Years of education: N/A   Occupational History  . retired    Social History Main Topics  . Smoking status: Never Smoker  . Smokeless tobacco: Never Used  . Alcohol use 0.0 oz/week     Comment: social  . Drug use: No  . Sexual activity: No   Other Topics Concern  . Not on file   Social History Narrative   Marital status: married x 17 years; second marriage      Children:  2 children; 3 grandchildren; no gg      Lives: with husband      Employment: retired in 2005; retired from 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.  Tobacco: never      Alcohol: rarely;       Exercise:  Silver Sneakers; Tai Chi once per week; exercises 3 days per week      ADLs: independent with ADLs; drives.  Husband does the cleaning and lifting groceries.      Advanced Directives: YES;  FULL CODE no prolonged measures.              Family History  Problem Relation Age of Onset  . Heart disease Mother   . Heart disease Father   . Heart disease Brother        Objective:    BP 124/72   Pulse 82   Temp 98.3 F (36.8 C) (Oral)   Resp 16   Ht 5' 4.5" (1.638 m)   Wt 174 lb  (78.9 kg)   SpO2 95%   BMI 29.41 kg/m  Physical Exam  Constitutional: She is oriented to person, place, and time. She appears well-developed and well-nourished. No distress.  HENT:  Head: Normocephalic and atraumatic.  Right Ear: External ear normal.  Left Ear: External ear normal.  Nose: Nose normal.  Mouth/Throat: Oropharynx is clear and moist.  Eyes: Conjunctivae and EOM are normal. Pupils are equal, round, and reactive to light.  Neck: Normal range of motion. Neck supple. Carotid bruit is not present. No thyromegaly present.  Cardiovascular: Normal rate, regular rhythm, normal heart sounds and intact distal pulses.  Exam reveals no gallop and no friction rub.   No murmur heard. Pulmonary/Chest: Effort normal and breath sounds normal. She has no wheezes. She has no rales.  Abdominal: Soft. Bowel sounds are normal. She exhibits no distension and no mass. There is no tenderness. There is no rebound and no guarding.  Lymphadenopathy:    She has no cervical adenopathy.  Neurological: She is alert and oriented to person, place, and time. No cranial nerve deficit.  Skin: Skin is warm and dry. No rash noted. She is not diaphoretic. No erythema. No pallor.  Psychiatric: She has a normal mood and affect. Her behavior is normal.  Flat affect.  Blunted responses.   Depression screen [facility] 2/9 11/01/2016 10/05/2016 05/02/2016 10/26/2015 03/22/2015  Decreased Interest 0 0 0 0 0  Down, Depressed, Hopeless 0 0 0 0 0  PHQ - 2 Score 0 0 0 0 0   Fall Risk  11/01/2016 10/05/2016 05/02/2016 10/26/2015 03/22/2015  Falls in the past year? _0   Number falls in past yr: 1 1 - 2 or more 1  Injury with Fall? - No - No -         Assessment & Plan:   1. Essential hypertension, benign   2. Type 2 diabetes mellitus without complication, without long-term current use of insulin ([place])   3. Seasonal allergic rhinitis due to pollen   4. Gastroesophageal reflux disease without esophagitis   5.  Irritable bowel syndrome with diarrhea   6. Dyslipidemia   7. Need for shingles vaccine    -controlled; obtain labs; continue current medications. -rx for Shingrix provided.   Orders Placed This Encounter  Procedures  . CBC with Differential/Platelet  . Comprehensive metabolic panel  . TSH   Meds ordered this encounter  Medications  . Zoster Vac Recomb Adjuvanted [facility]) injection    Sig: Inject 0.5 mLs into the muscle once.    Dispense:  0.5 mL    Refill:  1    Return in about 6 months (around 05/03/2017) for complete  physical examiniation.   Tonnya Garbett Elayne Guerin, M.D. Primary Care at [facility] [address] [place], [place]  [address] [phone] phone [phone] fax

## 2016-11-02 LAB — CBC WITH DIFFERENTIAL/PLATELET
Basophils Absolute: 0 10*3/uL (ref 0.0–0.2)
Basos: 0 %
EOS (ABSOLUTE): 0.1 10*3/uL (ref 0.0–0.4)
Eos: 1 %
Hematocrit: 43.3 % (ref 34.0–46.6)
Hemoglobin: 14.7 g/dL (ref 11.1–15.9)
Immature Grans (Abs): 0 10*3/uL (ref 0.0–0.1)
Immature Granulocytes: 0 %
Lymphocytes Absolute: 2.2 10*3/uL (ref 0.7–3.1)
Lymphs: 24 %
MCH: 28.2 pg (ref 26.6–33.0)
MCHC: 33.9 g/dL (ref 31.5–35.7)
MCV: 83 fL (ref 79–97)
Monocytes Absolute: 0.6 10*3/uL (ref 0.1–0.9)
Monocytes: 7 %
Neutrophils Absolute: 6.4 10*3/uL (ref 1.4–7.0)
Neutrophils: 68 %
Platelets: 305 10*3/uL (ref 150–379)
RBC: 5.21 x10E6/uL (ref 3.77–5.28)
RDW: 14 % (ref 12.3–15.4)
WBC: 9.4 10*3/uL (ref 3.4–10.8)

## 2016-11-02 LAB — COMPREHENSIVE METABOLIC PANEL
ALT: 14 IU/L (ref 0–32)
AST: 17 IU/L (ref 0–40)
Albumin/Globulin Ratio: 2.2 (ref 1.2–2.2)
Albumin: 4.3 g/dL (ref 3.5–4.8)
Alkaline Phosphatase: 61 IU/L (ref 39–117)
BUN/Creatinine Ratio: 17 (ref 12–28)
BUN: 20 mg/dL (ref 8–27)
Bilirubin Total: 0.3 mg/dL (ref 0.0–1.2)
CO2: 24 mmol/L (ref 18–29)
Calcium: 9.3 mg/dL (ref 8.7–10.3)
Chloride: 100 mmol/L (ref 96–106)
Creatinine, Ser: 1.2 mg/dL — ABNORMAL HIGH (ref 0.57–1.00)
GFR calc Af Amer: 51 mL/min/{1.73_m2} — ABNORMAL LOW (ref >59)
GFR calc non Af Amer: 44 mL/min/{1.73_m2} — ABNORMAL LOW (ref >59)
Globulin, Total: 2 g/dL (ref 1.5–4.5)
Glucose: 164 mg/dL — ABNORMAL HIGH (ref 65–99)
Potassium: 4.5 mmol/L (ref 3.5–5.2)
Sodium: 142 mmol/L (ref 134–144)
Total Protein: 6.3 g/dL (ref 6.0–8.5)

## 2016-11-02 LAB — TSH: TSH: 2.97 u[IU]/mL (ref 0.450–4.500)

## 2016-11-25 ENCOUNTER — Other Ambulatory Visit: Payer: Self-pay | Admitting: Family Medicine

## 2016-11-25 ENCOUNTER — Other Ambulatory Visit: Payer: Self-pay | Admitting: Internal Medicine

## 2016-11-29 LAB — HM DIABETES EYE EXAM

## 2017-01-08 ENCOUNTER — Other Ambulatory Visit: Payer: Self-pay | Admitting: Family Medicine

## 2017-01-15 ENCOUNTER — Ambulatory Visit: Payer: Medicare Other | Admitting: Internal Medicine

## 2017-02-22 ENCOUNTER — Other Ambulatory Visit: Payer: Self-pay | Admitting: Internal Medicine

## 2017-02-28 ENCOUNTER — Telehealth: Payer: Self-pay | Admitting: Family Medicine

## 2017-02-28 NOTE — Telephone Encounter (Signed)
Dr Tamala Julian Pt calling for a refill on [facility]

## 2017-03-01 ENCOUNTER — Other Ambulatory Visit: Payer: Self-pay | Admitting: Family Medicine

## 2017-03-08 ENCOUNTER — Other Ambulatory Visit: Payer: Self-pay | Admitting: Internal Medicine

## 2017-03-08 ENCOUNTER — Telehealth: Payer: Self-pay | Admitting: Family Medicine

## 2017-03-09 NOTE — Telephone Encounter (Signed)
Please advise 

## 2017-03-13 NOTE — Telephone Encounter (Signed)
Called for PA on xifaxon.  Received PA today through 03/27/2017.  Asked rep why it was such a short approval window.  She said it is a trial approval to see how patient does on the medication. I told her that it is a continuation of therapy, but she stated that this is how Medicare does PA's for certain medication now.  I notified the patient of what is happening with her medication and she verbalized understanding.

## 2017-03-23 ENCOUNTER — Ambulatory Visit: Payer: Medicare Other | Admitting: Internal Medicine

## 2017-04-04 ENCOUNTER — Telehealth: Payer: Self-pay

## 2017-04-04 NOTE — Telephone Encounter (Signed)
Called pt to schedule Medicare Annual Wellness Visit. -nr  

## 2017-04-04 NOTE — Telephone Encounter (Signed)
Called pt to schedule Medicare Annual Wellness Visit. Patient prefers to complete AWV with Dr. Tamala Julian and will schedule this at her next office visit.    Josepha Pigg, B.A.  Care Guide [phone]

## 2017-04-13 ENCOUNTER — Telehealth: Payer: Self-pay | Admitting: Family Medicine

## 2017-04-13 NOTE — Telephone Encounter (Signed)
Pt has gotten an email from [employer] about [employer]. She would like to know if this is legit or a scam. Please advise at [phone]

## 2017-04-16 NOTE — Telephone Encounter (Signed)
Pt advised that this is not a scam.

## 2017-04-19 ENCOUNTER — Ambulatory Visit: Payer: Medicare Other | Admitting: Internal Medicine

## 2017-04-30 ENCOUNTER — Telehealth: Payer: Self-pay

## 2017-04-30 NOTE — Telephone Encounter (Signed)
Left message on voicemail for patient to call back and schedule her Medicare AWV.

## 2017-05-02 ENCOUNTER — Ambulatory Visit: Payer: Medicare Other | Admitting: Family Medicine

## 2017-05-07 ENCOUNTER — Encounter: Payer: Self-pay | Admitting: Family Medicine

## 2017-05-07 ENCOUNTER — Ambulatory Visit (INDEPENDENT_AMBULATORY_CARE_PROVIDER_SITE_OTHER): Payer: Medicare Other | Admitting: Family Medicine

## 2017-05-07 VITALS — BP 138/80 | HR 70 | Temp 98.4°F | Resp 16 | Ht 65.75 in | Wt 173.0 lb

## 2017-05-07 DIAGNOSIS — E1121 Type 2 diabetes mellitus with diabetic nephropathy: Secondary | ICD-10-CM | POA: Diagnosis not present

## 2017-05-07 DIAGNOSIS — E785 Hyperlipidemia, unspecified: Secondary | ICD-10-CM

## 2017-05-07 DIAGNOSIS — N952 Postmenopausal atrophic vaginitis: Secondary | ICD-10-CM | POA: Diagnosis not present

## 2017-05-07 DIAGNOSIS — N898 Other specified noninflammatory disorders of vagina: Secondary | ICD-10-CM | POA: Diagnosis not present

## 2017-05-07 DIAGNOSIS — K580 Irritable bowel syndrome with diarrhea: Secondary | ICD-10-CM | POA: Diagnosis not present

## 2017-05-07 DIAGNOSIS — J301 Allergic rhinitis due to pollen: Secondary | ICD-10-CM | POA: Diagnosis not present

## 2017-05-07 DIAGNOSIS — R42 Dizziness and giddiness: Secondary | ICD-10-CM

## 2017-05-07 DIAGNOSIS — N3941 Urge incontinence: Secondary | ICD-10-CM | POA: Diagnosis not present

## 2017-05-07 DIAGNOSIS — I878 Other specified disorders of veins: Secondary | ICD-10-CM

## 2017-05-07 DIAGNOSIS — I10 Essential (primary) hypertension: Secondary | ICD-10-CM

## 2017-05-07 DIAGNOSIS — J302 Other seasonal allergic rhinitis: Secondary | ICD-10-CM

## 2017-05-07 MED ORDER — MECLIZINE HCL 25 MG PO TABS
25.0000 mg | ORAL_TABLET | Freq: Three times a day (TID) | ORAL | 0 refills | Status: DC | PRN
Start: 2017-05-07 — End: 2017-05-11

## 2017-05-07 MED ORDER — MUPIROCIN 2 % EX OINT: 1.0000 "application " | TOPICAL_OINTMENT | Freq: Three times a day (TID) | CUTANEOUS | 1 refills | Status: DC

## 2017-05-07 NOTE — Progress Notes (Signed)
Dix-Hallpike elicited dizziness to the right, no nystagmus noted. Epley Maneuver performed.  Patient notes some improvement in dizziness after Epley maneuver was performed.  Given educational material on how to perform Epley maneuver at home.  Tenna Delaine, PA-C  Primary [facility] at [facility] 05/07/2017 7:18 PM

## 2017-05-07 NOTE — Patient Instructions (Addendum)
IF you received an x-ray today, you will receive an invoice from [facility]. Please contact  [facility] at [phone] with questions or concerns regarding your invoice.   IF you received labwork today, you will receive an invoice from [facility]. Please contact [facility] at [phone] with questions or concerns regarding your invoice.   Our billing staff will not be able to assist you with questions regarding bills from these companies.  You will be contacted with the lab results as soon as they are available. The fastest way to get your results is to activate your My Chart account. Instructions are located on the last page of this paperwork. If you have not heard from [place] regarding the results in 2 weeks, please contact this office.     How to Perform the Epley Maneuver The Epley maneuver is an exercise that relieves symptoms of vertigo. Vertigo is the feeling that you or your surroundings are moving when they are not. When you feel vertigo, you may feel like the room is spinning and have trouble walking. Dizziness is a little different than vertigo. When you are dizzy, you may feel unsteady or light-headed. You can do this maneuver at home whenever you have symptoms of vertigo. You can do it up to 3 times a day until your symptoms go away. Even though the Epley maneuver may relieve your vertigo for a few weeks, it is possible that your symptoms will return. This maneuver relieves vertigo, but it does not relieve dizziness. What are the risks? If it is done correctly, the Epley maneuver is considered safe. Sometimes it can lead to dizziness or nausea that goes away after a short time. If you develop other symptoms, such as changes in vision, weakness, or numbness, stop doing the maneuver and call your health care provider. How to perform the Epley maneuver 1. Sit on the edge of a bed or table with your back straight and your legs extended or hanging over the edge of the  bed or table. 2. Turn your head halfway toward the affected ear or side. 3. Lie backward quickly with your head turned until you are lying flat on your back. You may want to position a pillow under your shoulders. 4. Hold this position for 30 seconds. You may experience an attack of vertigo. This is normal. 5. Turn your head to the opposite direction until your unaffected ear is facing the floor. 6. Hold this position for 30 seconds. You may experience an attack of vertigo. This is normal. Hold this position until the vertigo stops. 7. Turn your whole body to the same side as your head. Hold for another 30 seconds. 8. Sit back up. You can repeat this exercise up to 3 times a day. Follow these instructions at home:  After doing the Epley maneuver, you can return to your normal activities.  Ask your health care provider if there is anything you should do at home to prevent vertigo. He or she may recommend that you: ? Keep your head raised (elevated) with two or more pillows while you sleep. ? Do not sleep on the side of your affected ear. ? Get up slowly from bed. ? Avoid sudden movements during the day. ? Avoid extreme head movement, like looking up or bending over. Contact a health care provider if:  Your vertigo gets worse.  You have other symptoms, including: ? Nausea. ? Vomiting. ? Headache. Get help right away if:  You have vision changes.  You  have a severe or worsening headache or neck pain.  You cannot stop vomiting.  You have new numbness or weakness in any part of your body. Summary  Vertigo is the feeling that you or your surroundings are moving when they are not.  The Epley maneuver is an exercise that relieves symptoms of vertigo.  If the Epley maneuver is done correctly, it is considered safe. You can do it up to 3 times a day. This information is not intended to replace advice given to you by your health care provider. Make sure you discuss any questions you  have with your health care provider. Document Released: 07/01/2013 Document Revised: 05/16/2016 Document Reviewed: 05/16/2016 Elsevier Interactive Patient Education  2017 [employer].

## 2017-05-07 NOTE — Progress Notes (Signed)
Subjective:    Patient ID: [name] [name], female    DOB: 11-24-1940, 76 y.o.   MRN: [id number]  05/07/2017  Hypertension (6 month follow-up ); Dizziness (x 2 days ); and Diabetes    HPI This 76 y.o. female presents for six month follow-up of hypertension, IBS, hypercholesterolemia and also for evaluation of dizziness and vaginal sore.  Vertigo: acute onset two days ago.  No fever/chills/sweats.  No headache.  Lightheaded which is likely due to allergic rhinitis; taking Allegra and some Allegra D (insomnia) and Flonase.  Vertigo unpredictable; not pronounced all the time; usually when changing position of head; not with rolling over in bed.  Has not been driving.  Mild nausea; no vomiting.  No tinnitus; no hearing loss.  Previous vertigo in the past several years ago.  No medicaitons for dizziness.  Had to undergo physical therapy in the past; performed Eply maneuver and resolved.   Vaginal irritation: Monistat 3 day completed with persistent pain in vaginal area.    HTN: Patient reports good compliance with medication, good tolerance to medication, and good symptom control.    DMII: overdue for follow-up with endocrinology.    BP Readings from Last 3 Encounters:  05/07/17 138/80  11/01/16 124/72  10/05/16 131/63   Wt Readings from Last 3 Encounters:  05/07/17 173 lb (78.5 kg)  11/01/16 174 lb (78.9 kg)  10/05/16 169 lb 9.6 oz (76.9 kg)   Immunization History  Administered Date(s) Administered  . Influenza Split 03/19/2012, 03/19/2013  . Influenza,inj,Quad PF,6+ Mos 04/09/2014, 03/22/2015, 03/24/2016  . Influenza-Unspecified 04/09/2017  . Pneumococcal Conjugate-13 11/05/2014  . Pneumococcal Polysaccharide-23 07/10/2004, 12/12/2010, 05/02/2016  . Td 07/10/2009  . Zoster 07/10/2006  . Zoster Recombinat (Shingrix) 04/09/2017    Review of Systems  Constitutional: Negative for chills, diaphoresis, fatigue and fever.  HENT: Positive for congestion, postnasal drip and  rhinorrhea. Negative for ear pain, facial swelling, hearing loss, mouth sores, nosebleeds, sinus pain, sinus pressure, sneezing, sore throat, tinnitus, trouble swallowing and voice change.   Eyes: Negative for redness and visual disturbance.  Respiratory: Negative for cough and shortness of breath.   Cardiovascular: Negative for chest pain, palpitations and leg swelling.  Gastrointestinal: Negative for abdominal pain, constipation, diarrhea, nausea and vomiting.  Endocrine: Negative for cold intolerance, heat intolerance, polydipsia, polyphagia and polyuria.  Genitourinary: Positive for genital sores, vaginal discharge and vaginal pain. Negative for decreased urine volume, difficulty urinating, enuresis, flank pain, frequency, pelvic pain, urgency and vaginal bleeding.  Neurological: Positive for dizziness. Negative for tremors, seizures, syncope, facial asymmetry, speech difficulty, weakness, light-headedness, numbness and headaches.    Past Medical History:  Diagnosis Date  . Allergy    [name]; allergy shots weekly.    . Anxiety   . Arthritis    DDD lumbar spine.    . Cancer ([place])    breast L x 2; skin basal cell carcinoma [name]).  . 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 Diabetes mellitus   . Hypertension   . IBS (irritable bowel syndrome)    diarrhea predominant.   Past Surgical History:  Procedure Laterality Date  . BREAST LUMPECTOMY  1996   L breast cancer  . MASTECTOMY  2002   Bilateral for breast cancer  . VAGINAL HYSTERECTOMY  1985   DUB; uterine fibroids; ovaries intact.   Allergies  Allergen Reactions  . Fruit & Vegetable Daily [Nutritional Supplements] Diarrhea  . Onion Other (See Comments)  indigestion  . Tree Extract Swelling  . Sulfa Drugs Cross Reactors Rash    All over the body   Current Outpatient Prescriptions on File Prior to Visit  Medication Sig Dispense Refill  . aspirin 81 MG tablet Take 81 mg by mouth  daily.    . Blood Glucose Monitoring Suppl (ONE TOUCH ULTRA 2) W/DEVICE KIT Inject 1 application as directed as needed.    Marland Kitchen GLIPIZIDE XL 5 MG 24 hr tablet TAKE 1 TABLET EVERY DAY WITH BREAKFAST. 90 tablet 0  . glucose blood (ONETOUCH VERIO) test strip Use to test blood sugar 2 times daily as instructed. Dx: E11.29 100 each 3  . INVOKANA 100 MG TABS tablet TAKE 1 TABLET ONCE DAILY. 90 tablet 3  . metFORMIN (GLUCOPHAGE-XR) 500 MG 24 hr tablet TAKE 1 TABLET 3 TIMES DAILY WITH MEALS. 270 tablet 0  . Misc Natural Products (TART CHERRY ADVANCED PO) Take 1 capsule by mouth daily.    Glory Rosebush DELICA LANCETS 01E MISC Use to test blood sugar 2 times daily as instructed. Dx: E11.29 100 each 3  . XIFAXAN 550 MG TABS tablet TAKE 1 TABLET TWICE DAILY. 60 tablet 0   No current facility-administered medications on file prior to visit.    Social History   Social History  . Marital status: Married    Spouse name: N/A  . Number of children: N/A  . Years of education: N/A   Occupational History  . retired    Social History Main Topics  . Smoking status: Never Smoker  . Smokeless tobacco: Never Used  . Alcohol use 0.0 oz/week     Comment: social  . Drug use: No  . Sexual activity: No   Other Topics Concern  . Not on file   Social History Narrative   Marital status: married x 17 years; second marriage      Children:  2 children; 3 grandchildren; no gg      Lives: with husband      Employment: retired in 2005; retired from 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.      Tobacco: never      Alcohol: rarely;       Exercise:  Silver Sneakers; Tai Chi once per week; exercises 3 days per week      ADLs: independent with ADLs; drives.  Husband does the cleaning and lifting groceries.      Advanced Directives: YES;  FULL CODE no prolonged measures.              Family History  Problem Relation Age of Onset  . Heart disease Mother   . Heart disease Father   . Heart disease Brother         Objective:    BP 138/80   Pulse 70   Temp 98.4 F (36.9 C) (Oral)   Resp 16   Ht 5' 5.75" (1.67 m)   Wt 173 lb (78.5 kg)   SpO2 95%   BMI 28.14 kg/m  Physical Exam  Constitutional: She is oriented to person, place, and time. She appears well-developed and well-nourished. No distress.  HENT:  Head: Normocephalic and atraumatic.  Right Ear: External ear normal.  Left Ear: External ear normal.  Nose: Nose normal.  Mouth/Throat: Oropharynx is clear and moist.  Eyes: Pupils are equal, round, and reactive to light. Conjunctivae and EOM are normal.  Neck: Normal range of motion. Neck supple. Carotid bruit is not present. No thyromegaly present.  Cardiovascular: Normal rate, regular rhythm,  normal heart sounds and intact distal pulses.  Exam reveals no gallop and no friction rub.   No murmur heard. Pulmonary/Chest: Effort normal and breath sounds normal. She has no wheezes. She has no rales.  Abdominal: Soft. Bowel sounds are normal. She exhibits no distension and no mass. There is no tenderness. There is no rebound and no guarding.  Genitourinary: Vagina normal.    There is no rash on the right labia. There is no rash on the left labia. No erythema or tenderness in the vagina. No foreign body in the vagina. No signs of injury around the vagina. No vaginal discharge found.  Genitourinary Comments: Localized ulcerative lesion single large 8 mm diameter with active drainage white yellow at superior aspect of introitus.  +TTP; no fluctuance.  No vesicle.  No vaginal discharge.  Lymphadenopathy:    She has no cervical adenopathy.  Neurological: She is alert and oriented to person, place, and time. No cranial nerve deficit. She exhibits normal muscle tone. Coordination normal.  Skin: Skin is warm and dry. No rash noted. She is not diaphoretic. No erythema. No pallor.  Psychiatric: She has a normal mood and affect. Her behavior is normal. Judgment and thought content normal.   No  results found. Depression screen [facility] 2/9 05/07/2017 11/01/2016 10/05/2016 05/02/2016 10/26/2015  Decreased Interest 0 0 0 0 0  Down, Depressed, Hopeless 0 0 0 0 0  PHQ - 2 Score 0 0 0 0 0   Fall Risk  05/07/2017 11/01/2016 10/05/2016 05/02/2016 10/26/2015  Falls in the past year? Yes Yes Yes Yes Yes  Number falls in past yr: '1 1 1 ' - 2 or more  Injury with Fall? - - No - No        Assessment & Plan:   1. Controlled type 2 diabetes mellitus with diabetic nephropathy, without long-term current use of insulin ([place])   2. Essential hypertension   3. Dyslipidemia   4. Vaginal sore   5. Dizziness   6. Irritable bowel syndrome with diarrhea   7. Seasonal allergies   8. Atrophic vaginitis   9. Chronic seasonal allergic rhinitis due to pollen   10. Urge incontinence   11. Venous stasis    -New onset dizziness in the past few days; s/p Eply maneuver during visit by PA Timmothy Euler; rx for Meclizine provided; recommend home Eply maneuvers daily. -treat allergic rhinitis aggressively with oral antihistamine and nasal steroid. -new onset vaginal sore with active drainage; send wound cx and HSV cx; treat with topical mupirocin cream.  May warrant oral antibiotic if no improvement in upcoming 72 hours.  -hypertension controlled; obtain labs; continue current medications. -upcoming appointment with endocrinology; pt will return this week for fasting labs.    Orders Placed This Encounter  Procedures  . Herpes simplex virus culture    Order Specific Question:   Source    Answer:   vaginal labia  . WOUND CULTURE    Order Specific Question:   Source    Answer:   vaginal introitus  . Gram stain  . Genital culture  . Specimen status report   Meds ordered this encounter  Medications  . mupirocin ointment (BACTROBAN) 2 %    Sig: Apply 1 application topically 3 (three) times daily.    Dispense:  30 g    Refill:  1  . meclizine (ANTIVERT) 25 MG tablet    Sig: Take 1 tablet (25 mg total) by mouth 3  (three) times daily as needed for dizziness.  Dispense:  30 tablet    Refill:  0  . conjugated estrogens (PREMARIN) vaginal cream    Sig: Place vaginally every other day.    Dispense:  30 g    Refill:  6  . dicyclomine (BENTYL) 10 MG capsule    Sig: TAKE 1 CAPSULE 3 TIMES DAILY (BEFORE MEALS AND AT BEDTIME) as needed    Dispense:  120 capsule    Refill:  0  . fluticasone (FLONASE) 50 MCG/ACT nasal spray    Sig: Place 2 sprays into both nostrils daily.    Dispense:  48 g    Refill:  3  . furosemide (LASIX) 20 MG tablet    Sig: TAKE 1 OR 2 TABLETS DAILY AS DIRECTED.    Dispense:  180 tablet    Refill:  1  . montelukast (SINGULAIR) 10 MG tablet    Sig: Take 1 tablet (10 mg total) by mouth at bedtime.    Dispense:  90 tablet    Refill:  3  . TOPROL XL 100 MG 24 hr tablet    Sig: Take 1 tablet (100 mg total) by mouth 2 (two) times daily with a meal. Take with or immediately following a meal.    Dispense:  180 tablet    Refill:  1  . oxybutynin (DITROPAN XL) 15 MG 24 hr tablet    Sig: Take 1 tablet (15 mg total) by mouth at bedtime.    Dispense:  90 tablet    Refill:  3    Return in about 6 months (around 11/05/2017) for complete physical examiniation.   Mathilde Mcwherter Elayne Guerin, M.D. Primary Care at [facility] [place] [address] [place], [place]  [phone] phone [phone] fax

## 2017-05-09 DIAGNOSIS — I878 Other specified disorders of veins: Secondary | ICD-10-CM | POA: Insufficient documentation

## 2017-05-09 DIAGNOSIS — N3941 Urge incontinence: Secondary | ICD-10-CM | POA: Insufficient documentation

## 2017-05-09 MED ORDER — OXYBUTYNIN CHLORIDE ER 15 MG PO TB24: 15.0000 mg | ORAL_TABLET | Freq: Every day | ORAL | 3 refills | Status: DC

## 2017-05-09 MED ORDER — ESTROGENS, CONJUGATED 0.625 MG/GM VA CREA: TOPICAL_CREAM | VAGINAL | 6 refills | Status: DC

## 2017-05-09 MED ORDER — DICYCLOMINE HCL 10 MG PO CAPS: ORAL_CAPSULE | ORAL | 0 refills | Status: DC

## 2017-05-09 MED ORDER — FUROSEMIDE 20 MG PO TABS: ORAL_TABLET | ORAL | 1 refills | Status: DC

## 2017-05-09 MED ORDER — MONTELUKAST SODIUM 10 MG PO TABS: 10.0000 mg | ORAL_TABLET | Freq: Every day | ORAL | 3 refills | Status: DC

## 2017-05-09 MED ORDER — TOPROL XL 100 MG PO TB24: 100.0000 mg | ORAL_TABLET | Freq: Two times a day (BID) | ORAL | 1 refills | Status: DC

## 2017-05-09 MED ORDER — FLUTICASONE PROPIONATE 50 MCG/ACT NA SUSP: 2.0000 | Freq: Every day | NASAL | 3 refills | Status: DC

## 2017-05-10 LAB — GRAM STAIN

## 2017-05-10 LAB — SPECIMEN STATUS REPORT

## 2017-05-10 LAB — GENITAL CULTURE

## 2017-05-10 LAB — HERPES SIMPLEX VIRUS CULTURE

## 2017-05-11 ENCOUNTER — Other Ambulatory Visit: Payer: Self-pay | Admitting: Family Medicine

## 2017-05-11 ENCOUNTER — Ambulatory Visit (INDEPENDENT_AMBULATORY_CARE_PROVIDER_SITE_OTHER): Payer: Medicare Other

## 2017-05-11 VITALS — BP 130/68 | HR 85 | Temp 98.3°F | Ht 66.0 in | Wt 172.2 lb

## 2017-05-11 DIAGNOSIS — Z0000 Encounter for general adult medical examination without abnormal findings: Secondary | ICD-10-CM | POA: Diagnosis not present

## 2017-05-11 DIAGNOSIS — E1121 Type 2 diabetes mellitus with diabetic nephropathy: Secondary | ICD-10-CM

## 2017-05-11 DIAGNOSIS — E785 Hyperlipidemia, unspecified: Secondary | ICD-10-CM

## 2017-05-11 DIAGNOSIS — I10 Essential (primary) hypertension: Secondary | ICD-10-CM | POA: Diagnosis not present

## 2017-05-11 NOTE — Patient Instructions (Addendum)
[name] [name] , Thank you for taking time to come for your Medicare Wellness Visit. I appreciate your ongoing commitment to your health goals. Please review the following plan we discussed and let me know if I can assist you in the future.   Screening recommendations/referrals: Colonoscopy: up to date, next due 04/13/2021 Mammogram: up to date, no longer needed per patient Bone Density: up to date, next due 05/10/2021 Recommended yearly ophthalmology/optometry visit for glaucoma screening and checkup Recommended yearly dental visit for hygiene and checkup  Vaccinations: Influenza vaccine: up to date Pneumococcal vaccine: up to date Tdap vaccine: up to date, next due 07/11/2019 Shingles vaccine: up to date     Advanced directives: Please bring a copy of your POA (Power of [place]) and/or Living Will to your next appointment.   Conditions/risks identified: You declined healthcare goal today.   Next appointment: schedule follow up visit with PCP, 1 year for AWV   Preventive Care 28 Years and Older, Female Preventive care refers to lifestyle choices and visits with your health care provider that can promote health and wellness. What does preventive care include?  A yearly physical exam. This is also called an annual well check.  Dental exams once or twice a year.  Routine eye exams. Ask your health care provider how often you should have your eyes checked.  Personal lifestyle choices, including:  Daily care of your teeth and gums.  Regular physical activity.  Eating a healthy diet.  Avoiding tobacco and drug use.  Limiting alcohol use.  Practicing safe sex.  Taking low-dose aspirin every day.  Taking vitamin and mineral supplements as recommended by your health care provider. What happens during an annual well check? The services and screenings done by your health care provider during your annual well check will depend on your age, overall health, lifestyle risk factors,  and family history of disease. Counseling  Your health care provider may ask you questions about your:  Alcohol use.  Tobacco use.  Drug use.  Emotional well-being.  Home and relationship well-being.  Sexual activity.  Eating habits.  History of falls.  Memory and ability to understand (cognition).  Work and work Statistician.  Reproductive health. Screening  You may have the following tests or measurements:  Height, weight, and BMI.  Blood pressure.  Lipid and cholesterol levels. These may be checked every 5 years, or more frequently if you are over 83 years old.  Skin check.  Lung cancer screening. You may have this screening every year starting at age 8 if you have a 30-pack-year history of smoking and currently smoke or have quit within the past 15 years.  Fecal occult blood test (FOBT) of the stool. You may have this test every year starting at age 19.  Flexible sigmoidoscopy or colonoscopy. You may have a sigmoidoscopy every 5 years or a colonoscopy every 10 years starting at age 3.  Hepatitis C blood test.  Hepatitis B blood test.  Sexually transmitted disease (STD) testing.  Diabetes screening. This is done by checking your blood sugar (glucose) after you have not eaten for a while (fasting). You may have this done every 1-3 years.  Bone density scan. This is done to screen for osteoporosis. You may have this done starting at age 38.  Mammogram. This may be done every 1-2 years. Talk to your health care provider about how often you should have regular mammograms. Talk with your health care provider about your test results, treatment options, and if necessary,  the need for more tests. Vaccines  Your health care provider may recommend certain vaccines, such as:  Influenza vaccine. This is recommended every year.  Tetanus, diphtheria, and acellular pertussis (Tdap, Td) vaccine. You may need a Td booster every 10 years.  Zoster vaccine. You may need  this after age 68.  Pneumococcal 13-valent conjugate (PCV13) vaccine. One dose is recommended after age 93.  Pneumococcal polysaccharide (PPSV23) vaccine. One dose is recommended after age 46. Talk to your health care provider about which screenings and vaccines you need and how often you need them. This information is not intended to replace advice given to you by your health care provider. Make sure you discuss any questions you have with your health care provider. Document Released: 07/23/2015 Document Revised: 03/15/2016 Document Reviewed: 04/27/2015 Elsevier Interactive Patient Education  2017 [place] Prevention in the Home Falls can cause injuries. They can happen to people of all ages. There are many things you can do to make your home safe and to help prevent falls. What can I do on the outside of my home?  Regularly fix the edges of walkways and driveways and fix any cracks.  Remove anything that might make you trip as you walk through a door, such as a raised step or threshold.  Trim any bushes or trees on the path to your home.  Use bright outdoor lighting.  Clear any walking paths of anything that might make someone trip, such as rocks or tools.  Regularly check to see if handrails are loose or broken. Make sure that both sides of any steps have handrails.  Any raised decks and porches should have guardrails on the edges.  Have any leaves, snow, or ice cleared regularly.  Use sand or salt on walking paths during winter.  Clean up any spills in your garage right away. This includes oil or grease spills. What can I do in the bathroom?  Use night lights.  Install grab bars by the toilet and in the tub and shower. Do not use towel bars as grab bars.  Use non-skid mats or decals in the tub or shower.  If you need to sit down in the shower, use a plastic, non-slip stool.  Keep the floor dry. Clean up any water that spills on the floor as soon as it  happens.  Remove soap buildup in the tub or shower regularly.  Attach bath mats securely with double-sided non-slip rug tape.  Do not have throw rugs and other things on the floor that can make you trip. What can I do in the bedroom?  Use night lights.  Make sure that you have a light by your bed that is easy to reach.  Do not use any sheets or blankets that are too big for your bed. They should not hang down onto the floor.  Have a firm chair that has side arms. You can use this for support while you get dressed.  Do not have throw rugs and other things on the floor that can make you trip. What can I do in the kitchen?  Clean up any spills right away.  Avoid walking on wet floors.  Keep items that you use a lot in easy-to-reach places.  If you need to reach something above you, use a strong step stool that has a grab bar.  Keep electrical cords out of the way.  Do not use floor polish or wax that makes floors slippery. If you must use  wax, use non-skid floor wax.  Do not have throw rugs and other things on the floor that can make you trip. What can I do with my stairs?  Do not leave any items on the stairs.  Make sure that there are handrails on both sides of the stairs and use them. Fix handrails that are broken or loose. Make sure that handrails are as long as the stairways.  Check any carpeting to make sure that it is firmly attached to the stairs. Fix any carpet that is loose or worn.  Avoid having throw rugs at the top or bottom of the stairs. If you do have throw rugs, attach them to the floor with carpet tape.  Make sure that you have a light switch at the top of the stairs and the bottom of the stairs. If you do not have them, ask someone to add them for you. What else can I do to help prevent falls?  Wear shoes that:  Do not have high heels.  Have rubber bottoms.  Are comfortable and fit you well.  Are closed at the toe. Do not wear sandals.  If you  use a stepladder:  Make sure that it is fully opened. Do not climb a closed stepladder.  Make sure that both sides of the stepladder are locked into place.  Ask someone to hold it for you, if possible.  Clearly mark and make sure that you can see:  Any grab bars or handrails.  First and last steps.  Where the edge of each step is.  Use tools that help you move around (mobility aids) if they are needed. These include:  Canes.  Walkers.  Scooters.  Crutches.  Turn on the lights when you go into a dark area. Replace any light bulbs as soon as they burn out.  Set up your furniture so you have a clear path. Avoid moving your furniture around.  If any of your floors are uneven, fix them.  If there are any pets around you, be aware of where they are.  Review your medicines with your doctor. Some medicines can make you feel dizzy. This can increase your chance of falling. Ask your doctor what other things that you can do to help prevent falls. This information is not intended to replace advice given to you by your health care provider. Make sure you discuss any questions you have with your health care provider. Document Released: 04/22/2009 Document Revised: 12/02/2015 Document Reviewed: 07/31/2014 Elsevier Interactive Patient Education  2017 [employer].

## 2017-05-11 NOTE — Progress Notes (Signed)
Subjective:   [name] [name] is a 76 y.o. female who presents for Medicare Annual (Subsequent) preventive examination.  Review of Systems:  N/A Cardiac Risk Factors include: advanced age (>77mn, >>3women);diabetes mellitus;hypertension;dyslipidemia     Objective:     Vitals: BP 130/68   Pulse 85   Temp 98.3 F (36.8 C) (Oral)   Ht '5\' 6"'  (1.676 m)   Wt 172 lb 4 oz (78.1 kg)   SpO2 94%   BMI 27.80 kg/m   Body mass index is 27.8 kg/m.   Tobacco History  Smoking Status  . Never Smoker  Smokeless Tobacco  . Never Used     Counseling given: Not Answered   Past Medical History:  Diagnosis Date  . Allergy    VHarold Hedge allergy shots weekly.    . Anxiety   . Arthritis    DDD lumbar spine.    . Cancer ([facility]    breast L x 2; skin basal cell carcinoma ([name].  . 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 Diabetes mellitus   . Hypertension   . IBS (irritable bowel syndrome)    diarrhea predominant.   Past Surgical History:  Procedure Laterality Date  . BREAST LUMPECTOMY  1996   L breast cancer  . MASTECTOMY  2002   Bilateral for breast cancer  . VAGINAL HYSTERECTOMY  1985   DUB; uterine fibroids; ovaries intact.   Family History  Problem Relation Age of Onset  . Heart disease Mother   . Heart disease Father   . Heart disease Brother    History  Sexual Activity  . Sexual activity: No    Outpatient Encounter Prescriptions as of 05/11/2017  Medication Sig  . aspirin 81 MG tablet Take 81 mg by mouth daily.  . Blood Glucose Monitoring Suppl (ONE TOUCH ULTRA 2) W/DEVICE KIT Inject 1 application as directed as needed.  . dicyclomine (BENTYL) 10 MG capsule TAKE 1 CAPSULE 3 TIMES DAILY (BEFORE MEALS AND AT BEDTIME) as needed  . fluticasone (FLONASE) 50 MCG/ACT nasal spray Place 2 sprays into both nostrils daily.  . furosemide (LASIX) 20 MG tablet TAKE 1 OR 2 TABLETS DAILY AS DIRECTED.  .Marland KitchenGLIPIZIDE XL 5 MG 24 hr tablet  TAKE 1 TABLET EVERY DAY WITH BREAKFAST.  .Marland Kitchenglucose blood (ONETOUCH VERIO) test strip Use to test blood sugar 2 times daily as instructed. Dx: E11.29  . INVOKANA 100 MG TABS tablet TAKE 1 TABLET ONCE DAILY.  . metFORMIN (GLUCOPHAGE-XR) 500 MG 24 hr tablet TAKE 1 TABLET 3 TIMES DAILY WITH MEALS.  .Marland KitchenMisc Natural Products (TART CHERRY ADVANCED PO) Take 1 capsule by mouth daily.  . montelukast (SINGULAIR) 10 MG tablet Take 1 tablet (10 mg total) by mouth at bedtime.  . mupirocin ointment (BACTROBAN) 2 % Apply 1 application topically 3 (three) times daily.  .Glory RosebushDELICA LANCETS 334HMISC Use to test blood sugar 2 times daily as instructed. Dx: E11.29  . oxybutynin (DITROPAN-XL) 10 MG 24 hr tablet Take 10 mg by mouth at bedtime.  . TOPROL XL 100 MG 24 hr tablet Take 1 tablet (100 mg total) by mouth 2 (two) times daily with a meal. Take with or immediately following a meal.  . XIFAXAN 550 MG TABS tablet TAKE 1 TABLET TWICE DAILY. (Patient taking differently: TAKE 1 TABLET TWICE DAILY. PRN)  . [DISCONTINUED] conjugated estrogens (PREMARIN) vaginal cream Place vaginally every other day. (Patient not taking: Reported on  05/11/2017)  . [DISCONTINUED] meclizine (ANTIVERT) 25 MG tablet Take 1 tablet (25 mg total) by mouth 3 (three) times daily as needed for dizziness. (Patient not taking: Reported on 05/11/2017)  . [DISCONTINUED] oxybutynin (DITROPAN XL) 15 MG 24 hr tablet Take 1 tablet (15 mg total) by mouth at bedtime.   No facility-administered encounter medications on file as of 05/11/2017.     Activities of Daily Living In your present state of health, do you have any difficulty performing the following activities: 05/11/2017  Hearing? Y  Comment slight hearing loss  Vision? N  Difficulty concentrating or making decisions? N  Walking or climbing stairs? Y  Comment Has issues climbing stairs  Dressing or bathing? N  Doing errands, shopping? N  Preparing Food and eating ? N  Using the Toilet? N    In the past six months, have you accidently leaked urine? Y  Comment Patient wears a panty liner sometimes  Do you have problems with loss of bowel control? Y  Comment Patient wears a pantyliner sometimes  Managing your Medications? N  Managing your Finances? N  Housekeeping or managing your Housekeeping? N  Some recent data might be hidden    Patient Care Team: Wardell Honour, MD as PCP - General (Family Medicine) [name], MD (Ophthalmology) [name], MD as Consulting Physician (General Surgery) [name], MD as Consulting Physician (Internal Medicine)    Assessment:     Exercise Activities and Dietary recommendations Current Exercise Habits: The patient does not participate in regular exercise at present, Exercise limited by: None identified  Goals    . declined          Patient declined healthcare goal today.       Fall Risk Fall Risk  05/11/2017 05/07/2017 11/01/2016 10/05/2016 05/02/2016  Falls in the past year? Yes Yes Yes Yes Yes  Number falls in past yr: '1 1 1 1 ' -  Injury with Fall? No - - No -  Risk for fall due to : Impaired balance/gait - - - -  Risk for fall due to: Comment Patient has balance issues - - - -  Follow up Falls prevention discussed - - - -   Depression Screen PHQ 2/9 Scores 05/11/2017 05/07/2017 11/01/2016 10/05/2016  PHQ - 2 Score 0 0 0 0     Cognitive Function     6CIT Screen 05/11/2017  What Year? 0 points  What month? 0 points  What time? 0 points  Count back from 20 0 points  Months in reverse 0 points  Repeat phrase 0 points  Total Score 0    Immunization History  Administered Date(s) Administered  . Influenza Split 03/19/2012, 03/19/2013  . Influenza,inj,Quad PF,6+ Mos 04/09/2014, 03/22/2015, 03/24/2016  . Influenza-Unspecified 04/09/2017  . Pneumococcal Conjugate-13 11/05/2014  . Pneumococcal Polysaccharide-23 07/10/2004, 12/12/2010, 05/02/2016  . Td 07/10/2009  . Zoster 07/10/2006  . Zoster Recombinat  (Shingrix) 04/09/2017   Screening Tests Health Maintenance  Topic Date Due  . HEMOGLOBIN A1C  02/25/2017  . URINE MICROALBUMIN  05/02/2017  . OPHTHALMOLOGY EXAM  11/29/2017  . FOOT EXAM  05/07/2018  . TETANUS/TDAP  07/11/2019  . COLONOSCOPY  04/13/2021  . INFLUENZA VACCINE  Completed  . DEXA SCAN  Completed  . PNA vac Low Risk Adult  Completed      Plan:   I have personally reviewed and noted the following in the patient's chart:   . Medical and social history . Use of alcohol, tobacco or illicit drugs  .  Current medications and supplements . Functional ability and status . Nutritional status . Physical activity . Advanced directives . List of other physicians . Hospitalizations, surgeries, and ER visits in previous 12 months . Vitals . Screenings to include cognitive, depression, and falls . Referrals and appointments  In addition, I have reviewed and discussed with patient certain preventive protocols, quality metrics, and best practice recommendations. A written personalized care plan for preventive services as well as general preventive health recommendations were provided to patient.     Andrez Grime, LPN  29/01/9891

## 2017-05-12 LAB — URINALYSIS, ROUTINE W REFLEX MICROSCOPIC
Bilirubin, UA: NEGATIVE
Ketones, UA: NEGATIVE
Nitrite, UA: NEGATIVE
Protein, UA: NEGATIVE
RBC, UA: NEGATIVE
Specific Gravity, UA: 1.02 (ref 1.005–1.030)
Urobilinogen, Ur: 0.2 mg/dL (ref 0.2–1.0)
pH, UA: 5.5 (ref 5.0–7.5)

## 2017-05-12 LAB — LIPID PANEL
Chol/HDL Ratio: 4.2 ratio (ref 0.0–4.4)
Cholesterol, Total: 154 mg/dL (ref 100–199)
HDL: 37 mg/dL — ABNORMAL LOW (ref >39)
LDL Calculated: 55 mg/dL (ref 0–99)
Triglycerides: 309 mg/dL — ABNORMAL HIGH (ref 0–149)
VLDL Cholesterol Cal: 62 mg/dL — ABNORMAL HIGH (ref 5–40)

## 2017-05-12 LAB — CBC WITH DIFFERENTIAL/PLATELET
Basophils Absolute: 0 10*3/uL (ref 0.0–0.2)
Basos: 0 %
EOS (ABSOLUTE): 0.2 10*3/uL (ref 0.0–0.4)
Eos: 3 %
Hematocrit: 42.5 % (ref 34.0–46.6)
Hemoglobin: 14.7 g/dL (ref 11.1–15.9)
Immature Grans (Abs): 0.1 10*3/uL (ref 0.0–0.1)
Immature Granulocytes: 1 %
Lymphocytes Absolute: 2.1 10*3/uL (ref 0.7–3.1)
Lymphs: 24 %
MCH: 28.3 pg (ref 26.6–33.0)
MCHC: 34.6 g/dL (ref 31.5–35.7)
MCV: 82 fL (ref 79–97)
Monocytes Absolute: 0.7 10*3/uL (ref 0.1–0.9)
Monocytes: 9 %
Neutrophils Absolute: 5.5 10*3/uL (ref 1.4–7.0)
Neutrophils: 63 %
Platelets: 320 10*3/uL (ref 150–379)
RBC: 5.19 x10E6/uL (ref 3.77–5.28)
RDW: 14.6 % (ref 12.3–15.4)
WBC: 8.5 10*3/uL (ref 3.4–10.8)

## 2017-05-12 LAB — COMPREHENSIVE METABOLIC PANEL
ALT: 16 IU/L (ref 0–32)
AST: 17 IU/L (ref 0–40)
Albumin/Globulin Ratio: 1.8 (ref 1.2–2.2)
Albumin: 4.4 g/dL (ref 3.5–4.8)
Alkaline Phosphatase: 75 IU/L (ref 39–117)
BUN/Creatinine Ratio: 18 (ref 12–28)
BUN: 21 mg/dL (ref 8–27)
Bilirubin Total: 0.3 mg/dL (ref 0.0–1.2)
CO2: 20 mmol/L (ref 20–29)
Calcium: 9.6 mg/dL (ref 8.7–10.3)
Chloride: 100 mmol/L (ref 96–106)
Creatinine, Ser: 1.15 mg/dL — ABNORMAL HIGH (ref 0.57–1.00)
GFR calc Af Amer: 54 mL/min/{1.73_m2} — ABNORMAL LOW (ref >59)
GFR calc non Af Amer: 47 mL/min/{1.73_m2} — ABNORMAL LOW (ref >59)
Globulin, Total: 2.4 g/dL (ref 1.5–4.5)
Glucose: 130 mg/dL — ABNORMAL HIGH (ref 65–99)
Potassium: 4 mmol/L (ref 3.5–5.2)
Sodium: 139 mmol/L (ref 134–144)
Total Protein: 6.8 g/dL (ref 6.0–8.5)

## 2017-05-12 LAB — MICROALBUMIN / CREATININE URINE RATIO
Creatinine, Urine: 37.3 mg/dL
Microalb/Creat Ratio: <8 mg/g creat (ref 0.0–30.0)
Microalbumin, Urine: <3 ug/mL

## 2017-05-12 LAB — MICROSCOPIC EXAMINATION: Casts: NONE SEEN /lpf

## 2017-05-12 LAB — HEMOGLOBIN A1C
Est. average glucose Bld gHb Est-mCnc: 140 mg/dL
Hgb A1c MFr Bld: 6.5 % — ABNORMAL HIGH (ref 4.8–5.6)

## 2017-05-12 LAB — TSH: TSH: 4.75 u[IU]/mL — ABNORMAL HIGH (ref 0.450–4.500)

## 2017-05-21 ENCOUNTER — Other Ambulatory Visit: Payer: Self-pay | Admitting: Internal Medicine

## 2017-06-20 ENCOUNTER — Other Ambulatory Visit: Payer: Self-pay | Admitting: Family Medicine

## 2017-06-20 ENCOUNTER — Other Ambulatory Visit: Payer: Self-pay | Admitting: Internal Medicine

## 2017-06-22 ENCOUNTER — Telehealth: Payer: Self-pay | Admitting: Family Medicine

## 2017-06-22 NOTE — Telephone Encounter (Signed)
Copied from [place]. Topic: Quick Communication - See Telephone Encounter >> Jun 22, 2017 11:37 AM Ahmed Prima L wrote: CRM for notification. See Telephone encounter for:   06/22/17.  [facility] called and needs clarification on a medicine that was sent over 10/31 by Lanell Persons. Oxybutynin sent over as ER-12 mg and the pt said she wants ER-10mg . Please advise. Call back [phone]. Fax [phone]. They said they sent over yesterday also. Told her it would be atleast 3 business days.

## 2017-06-22 NOTE — Telephone Encounter (Signed)
See patients request:  Oxybutynin was ordered as ER-12 / Pt is request ER-10mg  / It looks like this request is pending by Dr. Tamala Julian  Please contact pharmacy if change is ok.

## 2017-06-22 NOTE — Telephone Encounter (Signed)
Reviewed chart: Ordered as Oxybutynin ER15mg  - Pt wants the 10mg - listed on historical data as the 10mg .   Sent message to Dr. Ranelle Oyster Prioity

## 2017-06-24 MED ORDER — OXYBUTYNIN CHLORIDE ER 10 MG PO TB24: 10.0000 mg | ORAL_TABLET | Freq: Every day | ORAL | 1 refills | Status: DC

## 2017-07-13 ENCOUNTER — Ambulatory Visit: Payer: Medicare Other | Admitting: Internal Medicine

## 2017-07-13 ENCOUNTER — Encounter: Payer: Self-pay | Admitting: Internal Medicine

## 2017-07-13 VITALS — BP 118/62 | HR 73 | Ht 64.5 in | Wt 168.8 lb

## 2017-07-13 DIAGNOSIS — E785 Hyperlipidemia, unspecified: Secondary | ICD-10-CM

## 2017-07-13 DIAGNOSIS — E1121 Type 2 diabetes mellitus with diabetic nephropathy: Secondary | ICD-10-CM

## 2017-07-13 DIAGNOSIS — G629 Polyneuropathy, unspecified: Secondary | ICD-10-CM

## 2017-07-13 NOTE — Patient Instructions (Addendum)
Please continue: - Invokana 100 mg in am - Metformin ER 500 mg 3x a day - Glipizide XL 5 mg before breakfast  Please come back for a follow-up appointment in 6 months with your sugar log.

## 2017-07-13 NOTE — Progress Notes (Signed)
Patient ID: [name] [name], female   DOB: Mar 29, 1941, 77 y.o.   MRN: [id number]  HPI: [name] [name] is a 77 y.o.-year-old female, returning for follow-up for DM2, dx 2014, non-insulin-dependent, now controlled, with complications (mild CKD, PN). Last visit 11 months ago.  Last hemoglobin A1c was: Lab Results  Component Value Date   HGBA1C 6.5 (H) 05/11/2017   HGBA1C 6.0 08/28/2016   HGBA1C 6.0 05/22/2016   Pt is on a regimen of: - Invokana 100 mg in am - started 04/2014 - Metformin ER 500 mg 3x a day with meals - Glipizide XL 5 mg daily, before breakfast  Pt checks her sugars seldom: - A.m.: 100-140 >> 117-135 >> 106-125 >> 100-120 - 2 hours post breakfast: 264 >> n/c >> 96, 139 >> n/c - Before lunch:  95-144 >> 126-133 >> n/c  - 2 hours post lunch:  100 >> 95, 131, 187 (large lunch) >> 83>> n/c - before dinner: 69 x2, 96-113, 189 >> n/c >> 93, 111 >> 79-153 >> n/c - 2 hours post dinner: 101, 103 >> n/c >> 140 >> n/c - Bedtime: n/c >> 84-121 >> n/c >> 116-169 >> n/c >> 102 Lowest: 79 (delayed meal) >> 100. Highest: 153 >> 140  Pt's meals are: - Breakfast: apple + cottage cheese + whole wheat - Lunch: leftovers from dinner; sandwich; salad - Dinner: varies; some fast food - Snacks: veggies; chips Some diet sodas.  She continues to [facility], Tai Chi 1x a week.  - + CKD, last BUN/creatinine:  Lab Results  Component Value Date   BUN 21 05/11/2017   CREATININE 1.15 (H) 05/11/2017  Not on an ACEI/ARB. -+ HL; last set of lipids: Lab Results  Component Value Date   CHOL 154 05/11/2017   HDL 37 (L) 05/11/2017   LDLCALC 55 05/11/2017   TRIG 309 (H) 05/11/2017   CHOLHDL 4.2 05/11/2017  Not on a statin. - last eye exam was in 11/2016: No DR. She has a history of cataract surgery. - She has numbness and tingling in her feet.  Currently on B complex and alpha lipoic acid. She used Lidocaine patches >> discontinued.  We started PN  cream , but this was expensive (93$). Voltaren gel works a little better. Tried Neurontin >> did not like it. She was reading about poss. SEs of Lyrica >> would not want to try it.  She had stool incontinence >> better after switching to metformin ER.  She is a friend of Dr. Milta Deiters.  ROS: Constitutional: no weight gain/no weight loss, no fatigue, no subjective hyperthermia, no subjective hypothermia Eyes: no blurry vision, no xerophthalmia ENT: no sore throat, no nodules palpated in throat, no dysphagia, no odynophagia, no hoarseness Cardiovascular: no CP/no SOB/no palpitations/no leg swelling Respiratory: no cough/no SOB/no wheezing Gastrointestinal: no N/no V/no D/no C/no acid reflux Musculoskeletal: no muscle aches/no joint aches Skin: no rashes, no hair loss Neurological: no tremors/no numbness/no tingling/no dizziness  I reviewed pt's medications, allergies, PMH, social hx, family hx, and changes were documented in the history of present illness. Otherwise, unchanged from my initial visit note.  PE: BP 118/62   Pulse 73   Ht 5' 4.5" (1.638 m)   Wt 168 lb 12.8 oz (76.6 kg)   SpO2 95%   BMI 28.53 kg/m  Body mass index is 28.53 kg/m. Wt Readings from Last 3 Encounters:  07/13/17 168 lb 12.8 oz (76.6 kg)  05/11/17  172 lb 4 oz (78.1 kg)  05/07/17 173 lb (78.5 kg)   Constitutional: overweight, in NAD Eyes: PERRLA, EOMI, no exophthalmos ENT: moist mucous membranes, no thyromegaly, no cervical lymphadenopathy Cardiovascular: RRR, No MRG Respiratory: CTA B Gastrointestinal: abdomen soft, NT, ND, BS+ Musculoskeletal: no deformities, strength intact in all 4 Skin: moist, warm, no rashes Neurological: + tremor with outstretched hands, DTR normal in all 4  ASSESSMENT: 1. 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 HL  PLAN:  1. Patient with  well-controlled diabetes, on oral antidiabetic regimen with Invokana, metformin ER, and glipizide ER, which sugars close to goal and the recent HbA1c of 6.5%.  This is higher compared to before, but still at goal. - she is not checking sugars now - just few values >> cannot change her regimen, but I did advise her to start checking 1x a day and let me know if they start increasing - I advised her to: Patient Instructions  Please continue: - Invokana 100 mg in am - Metformin ER 500 mg 3x a day - Glipizide XL 5 mg before breakfast  Please come back for a follow-up appointment in 6 months with your sugar log.  - continue checking sugars at different times of the day - check 1x a day, rotating checks - advised for yearly eye exams >> she is UTD - Return to clinic in 6 mo with sugar log   2. PN - She continues the following combination for neuropathy: - alpha-lipoic acid 600 mg twice a day - B complex 1 tablet daily  3. HL - reviewed latest Lipid panel from 05/2017: TG elevated, LDL at goal - not on a statin  [name], MD PhD [facility]

## 2017-08-21 ENCOUNTER — Telehealth: Payer: Self-pay

## 2017-08-21 NOTE — Telephone Encounter (Signed)
Copied from [place]. Topic: Inquiry >> Aug 21, 2017  3:09 PM Vernona Rieger wrote: Patient wants to know if her paper for her handicap sticker is ready for pick up, she said she dropped it off 2 weeks ago.  Call  back is [phone]

## 2017-08-22 NOTE — Telephone Encounter (Signed)
This is in the process of being completed by Dr. Tamala Julian. Will check with her on Monday for the statues.

## 2017-08-27 ENCOUNTER — Other Ambulatory Visit: Payer: Self-pay | Admitting: Internal Medicine

## 2017-08-29 ENCOUNTER — Encounter: Payer: Self-pay | Admitting: Family Medicine

## 2017-08-31 ENCOUNTER — Telehealth: Payer: Self-pay | Admitting: Internal Medicine

## 2017-08-31 NOTE — Telephone Encounter (Signed)
Patient called in stating that her pcp is retiring, hoping that Dr. Cruzita Lederer could recommend options for her.

## 2017-08-31 NOTE — Telephone Encounter (Signed)
Please advise on below  

## 2017-08-31 NOTE — Telephone Encounter (Signed)
We have many [place] offices - she can go to one closest to her. We can discuss more at next visit.

## 2017-09-03 NOTE — Telephone Encounter (Signed)
Unable to LVM received busy tone

## 2017-09-05 ENCOUNTER — Telehealth: Payer: Self-pay | Admitting: Internal Medicine

## 2017-09-05 NOTE — Telephone Encounter (Signed)
Patient would like for Dr Cruzita Lederer to let her know of a good primary care doctor.

## 2017-09-06 NOTE — Telephone Encounter (Signed)
Pt advised and voiced understanding.   

## 2017-09-06 NOTE — Telephone Encounter (Signed)
Duplicate message, see phone note dated 08/31/17.

## 2017-09-22 ENCOUNTER — Other Ambulatory Visit: Payer: Self-pay | Admitting: Family Medicine

## 2017-09-22 ENCOUNTER — Other Ambulatory Visit: Payer: Self-pay | Admitting: Internal Medicine

## 2017-09-24 NOTE — Telephone Encounter (Signed)
Dicyclomine refill LR: 06/24/17 LOV: 05/07/17 [facility]acy: [facility] PCP: Reginia Forts, MD

## 2017-09-25 NOTE — Telephone Encounter (Signed)
Refill request for dicyclomine 10 mg #120 with no refills approved.  Last ov on 05/07/17 pt advised to return for ov around 11/05/17 no scheduled appt in Epic.  Will send to scheduling pool to call pt and schedule 6 month f/u with Tamala Julian and further refills can be given then. Dgaddy, CMA

## 2017-09-25 NOTE — Telephone Encounter (Signed)
Called pt to let her know that her RX was ready. I also tried to get her to make the F/U that Dr. Tamala Julian was looking for her to make around 4/29. Pt stated that she could not make the appt at this time as she does not have her calender in front of her. She said that she will call in and make the appt later.   Pt also asked about her handicapped placard that she emailed Dr. Tamala Julian about on March 2nd. Could someone please advise pt of the status of the request?  Thank you!

## 2017-09-26 NOTE — Telephone Encounter (Signed)
Handicap placard completed. [facility]NA [place] contacted patient and left message that placard at [phone] for pick up.

## 2017-10-17 ENCOUNTER — Encounter: Payer: Self-pay | Admitting: Physician Assistant

## 2017-11-12 ENCOUNTER — Ambulatory Visit (INDEPENDENT_AMBULATORY_CARE_PROVIDER_SITE_OTHER): Payer: Medicare Other | Admitting: Family Medicine

## 2017-11-12 ENCOUNTER — Encounter: Payer: Self-pay | Admitting: Family Medicine

## 2017-11-12 ENCOUNTER — Other Ambulatory Visit: Payer: Self-pay

## 2017-11-12 VITALS — BP 130/82 | HR 65 | Temp 98.0°F | Resp 16 | Ht 64.17 in | Wt 170.0 lb

## 2017-11-12 DIAGNOSIS — J302 Other seasonal allergic rhinitis: Secondary | ICD-10-CM | POA: Diagnosis not present

## 2017-11-12 DIAGNOSIS — N3941 Urge incontinence: Secondary | ICD-10-CM

## 2017-11-12 DIAGNOSIS — I10 Essential (primary) hypertension: Secondary | ICD-10-CM | POA: Diagnosis not present

## 2017-11-12 DIAGNOSIS — Z860100 Personal history of colon polyps, unspecified: Secondary | ICD-10-CM

## 2017-11-12 DIAGNOSIS — Z0000 Encounter for general adult medical examination without abnormal findings: Secondary | ICD-10-CM

## 2017-11-12 DIAGNOSIS — M8589 Other specified disorders of bone density and structure, multiple sites: Secondary | ICD-10-CM

## 2017-11-12 DIAGNOSIS — E785 Hyperlipidemia, unspecified: Secondary | ICD-10-CM

## 2017-11-12 DIAGNOSIS — K580 Irritable bowel syndrome with diarrhea: Secondary | ICD-10-CM

## 2017-11-12 DIAGNOSIS — D6851 Activated protein C resistance: Secondary | ICD-10-CM | POA: Diagnosis not present

## 2017-11-12 DIAGNOSIS — R5383 Other fatigue: Secondary | ICD-10-CM

## 2017-11-12 DIAGNOSIS — C4490 Unspecified malignant neoplasm of skin, unspecified: Secondary | ICD-10-CM

## 2017-11-12 DIAGNOSIS — K219 Gastro-esophageal reflux disease without esophagitis: Secondary | ICD-10-CM | POA: Diagnosis not present

## 2017-11-12 DIAGNOSIS — J301 Allergic rhinitis due to pollen: Secondary | ICD-10-CM

## 2017-11-12 DIAGNOSIS — E1121 Type 2 diabetes mellitus with diabetic nephropathy: Secondary | ICD-10-CM

## 2017-11-12 LAB — POCT URINALYSIS DIP (MANUAL ENTRY)
Bilirubin, UA: NEGATIVE
Blood, UA: NEGATIVE
Glucose, UA: >=1000 mg/dL — AB
Ketones, POC UA: NEGATIVE mg/dL
Nitrite, UA: NEGATIVE
Protein Ur, POC: NEGATIVE mg/dL
Spec Grav, UA: <=1.005 — AB (ref 1.010–1.025)
Urobilinogen, UA: 0.2 E.U./dL
pH, UA: 5 (ref 5.0–8.0)

## 2017-11-12 MED ORDER — TOPROL XL 100 MG PO TB24: 100.0000 mg | ORAL_TABLET | Freq: Two times a day (BID) | ORAL | 1 refills | Status: DC

## 2017-11-12 MED ORDER — FUROSEMIDE 20 MG PO TABS: ORAL_TABLET | ORAL | 1 refills | Status: DC

## 2017-11-12 MED ORDER — OXYBUTYNIN CHLORIDE ER 10 MG PO TB24: 10.0000 mg | ORAL_TABLET | Freq: Every day | ORAL | 1 refills | Status: DC

## 2017-11-12 MED ORDER — FLUTICASONE PROPIONATE 50 MCG/ACT NA SUSP: 2.0000 | Freq: Every day | NASAL | 3 refills | Status: DC

## 2017-11-12 MED ORDER — MONTELUKAST SODIUM 10 MG PO TABS: 10.0000 mg | ORAL_TABLET | Freq: Every day | ORAL | 3 refills | Status: DC

## 2017-11-12 NOTE — Progress Notes (Signed)
Subjective:    Patient ID: [name] [name], female    DOB: 1940/10/14, 77 y.o.   MRN: [id number]  11/12/2017  Annual Exam    HPI This 77 y.o. female presents for COMPLETE PHYSICAL EXAMINATION.  Last physical:  05/02/2016; AWV  05/11/2017 Pap smear:  Hysterectomy; no longer warrants pap smear again. Mammogram:   Colonoscopy: 2012; [facility]. Bone density:  07/2016 Eye exam:  Appointment this month Dental exam:  Every six months.    Suffering with fatigue: no stamina; no SOB; no chest pain; hypersomnolence.  With exercise, has sore legs.  Exercising for years. No sleep study.  Husband uses CPAP.  Patient does snore.   Overactive bladder: doing moderately well with Ditropan therapy.  HTN: Patient reports good compliance with medication, good tolerance to medication, and good symptom control.  Does not monitor blood pressure at home.  IBS diarrhea: chronic issue for patient.  Uses Bentyl PRN; insurance will not cover Xifaxan.   DMII: followed by endocrinology every 3-6 months.  Allergic rhinitis: stable with Flonase and Singulair.    Visual Acuity Screening   Right eye Left eye Both eyes  Without correction: '20/30 20/30 20/30 '  With correction:       BP Readings from Last 3 Encounters:  01/24/18 126/70  11/12/17 130/82  07/13/17 118/62   Wt Readings from Last 3 Encounters:  01/24/18 171 lb 12.8 oz (77.9 kg)  11/12/17 170 lb (77.1 kg)  07/13/17 168 lb 12.8 oz (76.6 kg)   Immunization History  Administered Date(s) Administered  . Influenza Split 03/19/2012, 03/19/2013  . Influenza,inj,Quad PF,6+ Mos 04/09/2014, 03/22/2015, 03/24/2016  . Influenza-Unspecified 04/09/2017  . Pneumococcal Conjugate-13 11/05/2014  . Pneumococcal Polysaccharide-23 07/10/2004, 12/12/2010, 05/02/2016  . Td 07/10/2009  . Zoster 07/10/2006  . Zoster Recombinat (Shingrix) 04/09/2017, 07/19/2017   Health Maintenance  Topic Date Due  . OPHTHALMOLOGY EXAM  11/29/2017  . INFLUENZA  VACCINE  02/07/2018  . FOOT EXAM  05/07/2018  . URINE MICROALBUMIN  05/11/2018  . HEMOGLOBIN A1C  07/27/2018  . TETANUS/TDAP  07/11/2019  . DEXA SCAN  Completed  . PNA vac Low Risk Adult  Completed    Review of Systems  Constitutional: Positive for fatigue. Negative for activity change, appetite change, chills, diaphoresis, fever and unexpected weight change.  HENT: Negative for congestion, dental problem, drooling, ear discharge, ear pain, facial swelling, hearing loss, mouth sores, nosebleeds, postnasal drip, rhinorrhea, sinus pressure, sneezing, sore throat, tinnitus, trouble swallowing and voice change.   Eyes: Negative for photophobia, pain, discharge, redness, itching and visual disturbance.  Respiratory: Negative for apnea, cough, choking, chest tightness, shortness of breath, wheezing and stridor.   Cardiovascular: Negative for chest pain, palpitations and leg swelling.  Gastrointestinal: Positive for diarrhea. Negative for abdominal distention, abdominal pain, anal bleeding, blood in stool, constipation, nausea, rectal pain and vomiting.  Endocrine: Negative for cold intolerance, heat intolerance, polydipsia, polyphagia and polyuria.  Genitourinary: Negative for decreased urine volume, difficulty urinating, dyspareunia, dysuria, enuresis, flank pain, frequency, genital sores, hematuria, menstrual problem, pelvic pain, urgency, vaginal bleeding, vaginal discharge and vaginal pain.       Nocturia x 1.  Urinary incontinence rare/mild.  Musculoskeletal: Negative for arthralgias, back pain, gait problem, joint swelling, myalgias, neck pain and neck stiffness.  Skin: Negative for color change, pallor, rash and wound.  Allergic/Immunologic: Negative for environmental allergies, food allergies and immunocompromised state.  Neurological: Negative for dizziness, tremors, seizures, syncope, facial asymmetry, speech difficulty, weakness, light-headedness, numbness and headaches.  Hematological:  Negative for adenopathy. Does not bruise/bleed easily.  Psychiatric/Behavioral: Negative for agitation, behavioral problems, confusion, decreased concentration, dysphoric mood, hallucinations, self-injury, sleep disturbance and suicidal ideas. The patient is not nervous/anxious and is not hyperactive.        Bedtime varies 11-12; wakes up varies 8-830.    Past Medical History:  Diagnosis Date  . Allergy    [name]; allergy shots weekly.    . Anxiety   . Arthritis    DDD lumbar spine.    . Cancer ([place])    breast L x 2; skin basal cell carcinoma [name]).  . 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 Diabetes mellitus   . Hypertension   . IBS (irritable bowel syndrome)    diarrhea predominant.   Past Surgical History:  Procedure Laterality Date  . BREAST LUMPECTOMY  1996   L breast cancer  . MASTECTOMY  2002   Bilateral for breast cancer  . VAGINAL HYSTERECTOMY  1985   DUB; uterine fibroids; ovaries intact.   Allergies  Allergen Reactions  . Fruit & Vegetable Daily [Nutritional Supplements] Diarrhea  . Onion Other (See Comments)    indigestion  . Tree Extract Swelling  . Sulfa Drugs Cross Reactors Rash    All over the body   Current Outpatient Medications on File Prior to Visit  Medication Sig Dispense Refill  . aspirin 81 MG tablet Take 81 mg by mouth daily.    . Blood Glucose Monitoring Suppl (ONE TOUCH ULTRA 2) W/DEVICE KIT Inject 1 application as directed as needed.    . dicyclomine (BENTYL) 10 MG capsule TAKE 1 CAPSULE 4 TIMES DAILY (BEFORE MEALS AND AT BEDTIME). 120 capsule 0  . Misc Natural Products (TART CHERRY ADVANCED PO) Take 1 capsule by mouth daily.    . mupirocin ointment (BACTROBAN) 2 % Apply 1 application topically 3 (three) times daily. 30 g 1  . XIFAXAN 550 MG TABS tablet TAKE 1 TABLET TWICE DAILY. (Patient taking differently: TAKE 1 TABLET TWICE DAILY. PRN) 60 tablet 0   No current facility-administered medications on  file prior to visit.    Social History   Socioeconomic History  . Marital status: Married    Spouse name: Not on file  . Number of children: Not on file  . Years of education: Not on file  . Highest education level: Not on file  Occupational History  . Occupation: retired  Scientific laboratory technician  . Financial resource strain: Not on file  . Food insecurity:    Worry: Not on file    Inability: Not on file  . Transportation needs:    Medical: Not on file    Non-medical: Not on file  Tobacco Use  . Smoking status: Never Smoker  . Smokeless tobacco: Never Used  Substance and Sexual Activity  . Alcohol use: No    Alcohol/week: 0.0 oz    Comment: rare  . Drug use: No  . Sexual activity: Never  Lifestyle  . Physical activity:    Days per week: Not on file    Minutes per session: Not on file  . Stress: Not on file  Relationships  . Social connections:    Talks on phone: Not on file    Gets together: Not on file    Attends religious service: Not on file    Active member of club or organization: Not on file    Attends meetings of clubs or organizations: Not on file  Relationship status: Not on file  . Intimate partner violence:    Fear of current or ex partner: Not on file    Emotionally abused: Not on file    Physically abused: Not on file    Forced sexual activity: Not on file  Other Topics Concern  . 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.      Tobacco: never      Alcohol: rarely;       Exercise:  Silver Sneakers; Tai Chi once per week; exercises 3 days per week; balance class      ADLs: independent with ADLs; drives.  Husband does the cleaning and lifting groceries.      Advanced Directives: YES;  FULL CODE no prolonged measures.                Family History  Problem Relation Age of Onset  . Heart disease Mother        AMI as cause of death age 100.  Marland Kitchen Heart disease Father        AMI as cause of death.  . Heart disease Brother        Objective:    BP 130/82   Pulse 65   Temp 98 F (36.7 C) (Oral)   Resp 16   Ht 5' 4.17" (1.63 m)   Wt 170 lb (77.1 kg)   SpO2 96%   BMI 29.02 kg/m  Physical Exam  Constitutional: She is oriented to person, place, and time. She appears well-developed and well-nourished. No distress.  HENT:  Head: Normocephalic and atraumatic.  Right Ear: External ear normal.  Left Ear: External ear normal.  Nose: Nose normal.  Mouth/Throat: Oropharynx is clear and moist.  Eyes: Pupils are equal, round, and reactive to light. Conjunctivae and EOM are normal.  Neck: Normal range of motion and full passive range of motion without pain. Neck supple. No JVD present. Carotid bruit is not present. No thyromegaly present.  Cardiovascular: Normal rate, regular rhythm and normal heart sounds. Exam reveals no gallop and no friction rub.  No murmur heard. Pulmonary/Chest: Effort normal and breath sounds normal. She has no wheezes. She has no rales.  Abdominal: Soft. Bowel sounds are normal. She exhibits no distension and no mass. There is no tenderness. There is no rebound and no guarding.  Musculoskeletal:       Right shoulder: Normal.       Left shoulder: Normal.       Cervical back: Normal.  Lymphadenopathy:    She has no cervical adenopathy.  Neurological: She is alert and oriented to person, place, and time. She has normal reflexes. No cranial nerve deficit. She exhibits normal muscle tone. Coordination normal.  Skin: Skin is warm and dry. No rash noted. She is not diaphoretic. No erythema. No pallor.  Psychiatric: She has a normal mood and affect. Her behavior is normal. Judgment and thought content normal.  Nursing note and vitals reviewed.  No results found. Depression screen [facility] 2/9  11/12/2017 05/11/2017 05/07/2017 11/01/2016 10/05/2016  Decreased Interest 0 0 0 0 0  Down, Depressed, Hopeless 0 0 0 0 0  PHQ - 2 Score 0 0 0 0 0   Fall  Risk  11/12/2017 05/11/2017 05/07/2017 11/01/2016 10/05/2016  Falls in the past year? No Yes Yes Yes Yes  Number falls in past yr: - '1 1 1 1  ' Injury with Fall? - No - - No  Risk for fall due to : - Impaired balance/gait - - -  Risk for fall due to: Comment - Patient has balance issues - - -  Follow up - Falls prevention discussed - - -   Functional Status Survey:        Assessment & Plan:   1. Routine physical examination   2. Essential hypertension   3. Gastroesophageal reflux disease without esophagitis   4. Controlled type 2 diabetes mellitus with diabetic nephropathy, without long-term current use of insulin ([place])   5. Dyslipidemia   6. Irritable bowel syndrome with diarrhea   7. Osteopenia of multiple sites   8. Non-melanoma skin cancer   9. Factor 5 Leiden mutation, heterozygous ([place])   10. Urge incontinence   11. History of colonic polyps   12. Seasonal allergies   13. Chronic seasonal allergic rhinitis due to pollen   14. Other fatigue     -anticipatory guidance provided --- exercise, weight loss, safe driving practices, aspirin 30m daily. -obtain age appropriate screening labs and labs for chronic disease management. -moderate fall risk; no evidence of depression; no evidence of hearing loss.  Discussed advanced directives and living will; also discussed end of life issues including code status.  -controlled chronic medical conditions; obtain labs for chronic disease management; refills provided.  -diabetes managed by endocrinology. -FATIGUE: NEW ONSET; OBTAIN LABS; CONSIDER SLEEP STUDY.   Orders Placed This Encounter  Procedures  . CBC with Differential/Platelet  . Comprehensive metabolic panel    Order Specific Question:   Has the patient fasted?    Answer:   No  . Hemoglobin A1c  . Lipid panel    Order Specific  Question:   Has the patient fasted?    Answer:   No  . TSH  . POCT urinalysis dipstick  . EKG 12-Lead   Meds ordered this encounter  Medications  . fluticasone (FLONASE) 50 MCG/ACT nasal spray    Sig: Place 2 sprays into both nostrils daily.    Dispense:  48 g    Refill:  3  . furosemide (LASIX) 20 MG tablet    Sig: TAKE 1 OR 2 TABLETS DAILY AS DIRECTED.    Dispense:  180 tablet    Refill:  1  . montelukast (SINGULAIR) 10 MG tablet    Sig: Take 1 tablet (10 mg total) by mouth at bedtime.    Dispense:  90 tablet    Refill:  3  . oxybutynin (DITROPAN-XL) 10 MG 24 hr tablet    Sig: Take 1 tablet (10 mg total) by mouth at bedtime.    Dispense:  90 tablet    Refill:  1  . TOPROL XL 100 MG 24 hr tablet    Sig: Take 1 tablet (100 mg total) by mouth 2 (two) times daily with a meal. Take with or immediately following a meal.    Dispense:  180 tablet    Refill:  1    Return in about 6 months (around 05/15/2018) for follow-up chronic medical conditions [place].   Cabell Lazenby MElayne Guerin M.D. [facility]  [phone] [phone]

## 2017-11-12 NOTE — Patient Instructions (Addendum)
   IF you received an x-ray today, you will receive an invoice from [facility]. Please contact [facility] at [phone] with questions or concerns regarding your invoice.   IF you received labwork today, you will receive an invoice from [facility]. Please contact [facility] at [phone] with questions or concerns regarding your invoice.   Our billing staff will not be able to assist you with questions regarding bills from these companies.  You will be contacted with the lab results as soon as they are available. The fastest way to get your results is to activate your My Chart account. Instructions are located on the last page of this paperwork. If you have not heard from us regarding the results in 2 weeks, please contact this office.      Preventive Care 77 Years and Older, Female Preventive care refers to lifestyle choices and visits with your health care provider that can promote health and wellness. What does preventive care include?  A yearly physical exam. This is also called an annual well check.  Dental exams once or twice a year.  Routine eye exams. Ask your health care provider how often you should have your eyes checked.  Personal lifestyle choices, including: ? Daily care of your teeth and gums. ? Regular physical activity. ? Eating a healthy diet. ? Avoiding tobacco and drug use. ? Limiting alcohol use. ? Practicing safe sex. ? Taking low-dose aspirin every day. ? Taking vitamin and mineral supplements as recommended by your health care provider. What happens during an annual well check? The services and screenings done by your health care provider during your annual well check will depend on your age, overall health, lifestyle risk factors, and family history of disease. Counseling Your health care provider may ask you questions about your:  Alcohol use.  Tobacco use.  Drug use.  Emotional well-being.  Home and relationship  well-being.  Sexual activity.  Eating habits.  History of falls.  Memory and ability to understand (cognition).  Work and work environment.  Reproductive health.  Screening You may have the following tests or measurements:  Height, weight, and BMI.  Blood pressure.  Lipid and cholesterol levels. These may be checked every 5 years, or more frequently if you are over 50 years old.  Skin check.  Lung cancer screening. You may have this screening every year starting at age 55 if you have a 30-pack-year history of smoking and currently smoke or have quit within the past 15 years.  Fecal occult blood test (FOBT) of the stool. You may have this test every year starting at age 50.  Flexible sigmoidoscopy or colonoscopy. You may have a sigmoidoscopy every 5 years or a colonoscopy every 10 years starting at age 50.  Hepatitis C blood test.  Hepatitis B blood test.  Sexually transmitted disease (STD) testing.  Diabetes screening. This is done by checking your blood sugar (glucose) after you have not eaten for a while (fasting). You may have this done every 1-3 years.  Bone density scan. This is done to screen for osteoporosis. You may have this done starting at age 65.  Mammogram. This may be done every 1-2 years. Talk to your health care provider about how often you should have regular mammograms.  Talk with your health care provider about your test results, treatment options, and if necessary, the need for more tests. Vaccines Your health care provider may recommend certain vaccines, such as:  Influenza vaccine. This is recommended every year.    Tetanus, diphtheria, and acellular pertussis (Tdap, Td) vaccine. You may need a Td booster every 10 years.  Varicella vaccine. You may need this if you have not been vaccinated.  Zoster vaccine. You may need this after age 60.  Measles, mumps, and rubella (MMR) vaccine. You may need at least one dose of MMR if you were born in  1957 or later. You may also need a second dose.  Pneumococcal 13-valent conjugate (PCV13) vaccine. One dose is recommended after age 65.  Pneumococcal polysaccharide (PPSV23) vaccine. One dose is recommended after age 65.  Meningococcal vaccine. You may need this if you have certain conditions.  Hepatitis A vaccine. You may need this if you have certain conditions or if you travel or work in places where you may be exposed to hepatitis A.  Hepatitis B vaccine. You may need this if you have certain conditions or if you travel or work in places where you may be exposed to hepatitis B.  Haemophilus influenzae type b (Hib) vaccine. You may need this if you have certain conditions.  Talk to your health care provider about which screenings and vaccines you need and how often you need them. This information is not intended to replace advice given to you by your health care provider. Make sure you discuss any questions you have with your health care provider. Document Released: 07/23/2015 Document Revised: 03/15/2016 Document Reviewed: 04/27/2015 Elsevier Interactive Patient Education  2018 Elsevier Inc.  

## 2017-11-13 LAB — LIPID PANEL
Chol/HDL Ratio: 5.2 ratio — ABNORMAL HIGH (ref 0.0–4.4)
Cholesterol, Total: 178 mg/dL (ref 100–199)
HDL: 34 mg/dL — ABNORMAL LOW (ref >39)
Triglycerides: 442 mg/dL — ABNORMAL HIGH (ref 0–149)

## 2017-11-13 LAB — COMPREHENSIVE METABOLIC PANEL
ALT: 17 IU/L (ref 0–32)
AST: 16 IU/L (ref 0–40)
Albumin/Globulin Ratio: 2.4 — ABNORMAL HIGH (ref 1.2–2.2)
Albumin: 4.7 g/dL (ref 3.5–4.8)
Alkaline Phosphatase: 80 IU/L (ref 39–117)
BUN/Creatinine Ratio: 18 (ref 12–28)
BUN: 21 mg/dL (ref 8–27)
Bilirubin Total: 0.4 mg/dL (ref 0.0–1.2)
CO2: 20 mmol/L (ref 20–29)
Calcium: 9.8 mg/dL (ref 8.7–10.3)
Chloride: 101 mmol/L (ref 96–106)
Creatinine, Ser: 1.19 mg/dL — ABNORMAL HIGH (ref 0.57–1.00)
GFR calc Af Amer: 51 mL/min/{1.73_m2} — ABNORMAL LOW (ref >59)
GFR calc non Af Amer: 44 mL/min/{1.73_m2} — ABNORMAL LOW (ref >59)
Globulin, Total: 2 g/dL (ref 1.5–4.5)
Glucose: 213 mg/dL — ABNORMAL HIGH (ref 65–99)
Potassium: 4.3 mmol/L (ref 3.5–5.2)
Sodium: 142 mmol/L (ref 134–144)
Total Protein: 6.7 g/dL (ref 6.0–8.5)

## 2017-11-13 LAB — CBC WITH DIFFERENTIAL/PLATELET
Basophils Absolute: 0 10*3/uL (ref 0.0–0.2)
Basos: 0 %
EOS (ABSOLUTE): 0.2 10*3/uL (ref 0.0–0.4)
Eos: 2 %
Hematocrit: 45.9 % (ref 34.0–46.6)
Hemoglobin: 15.2 g/dL (ref 11.1–15.9)
Immature Grans (Abs): 0.1 10*3/uL (ref 0.0–0.1)
Immature Granulocytes: 1 %
Lymphocytes Absolute: 2.3 10*3/uL (ref 0.7–3.1)
Lymphs: 21 %
MCH: 28.6 pg (ref 26.6–33.0)
MCHC: 33.1 g/dL (ref 31.5–35.7)
MCV: 86 fL (ref 79–97)
Monocytes Absolute: 0.7 10*3/uL (ref 0.1–0.9)
Monocytes: 7 %
Neutrophils Absolute: 7.7 10*3/uL — ABNORMAL HIGH (ref 1.4–7.0)
Neutrophils: 69 %
Platelets: 382 10*3/uL — ABNORMAL HIGH (ref 150–379)
RBC: 5.31 x10E6/uL — ABNORMAL HIGH (ref 3.77–5.28)
RDW: 14.4 % (ref 12.3–15.4)
WBC: 11 10*3/uL — ABNORMAL HIGH (ref 3.4–10.8)

## 2017-11-13 LAB — TSH: TSH: 3.1 u[IU]/mL (ref 0.450–4.500)

## 2017-11-13 LAB — HEMOGLOBIN A1C
Est. average glucose Bld gHb Est-mCnc: 151 mg/dL
Hgb A1c MFr Bld: 6.9 % — ABNORMAL HIGH (ref 4.8–5.6)

## 2017-11-26 ENCOUNTER — Encounter: Payer: Self-pay | Admitting: Family Medicine

## 2017-11-26 ENCOUNTER — Other Ambulatory Visit: Payer: Self-pay | Admitting: Internal Medicine

## 2017-11-26 ENCOUNTER — Other Ambulatory Visit: Payer: Self-pay | Admitting: Family Medicine

## 2017-11-27 MED ORDER — LIDOCAINE 5 % EX PTCH: 1.0000 | MEDICATED_PATCH | CUTANEOUS | 5 refills | Status: DC

## 2017-12-05 ENCOUNTER — Encounter: Payer: Self-pay | Admitting: Family Medicine

## 2017-12-19 ENCOUNTER — Telehealth: Payer: Self-pay | Admitting: *Deleted

## 2017-12-19 NOTE — Telephone Encounter (Signed)
Spoke with patient advised lidocaine patch approved.

## 2017-12-27 ENCOUNTER — Other Ambulatory Visit: Payer: Self-pay | Admitting: Internal Medicine

## 2017-12-29 ENCOUNTER — Other Ambulatory Visit: Payer: Self-pay | Admitting: Internal Medicine

## 2018-01-11 ENCOUNTER — Ambulatory Visit: Payer: Medicare Other | Admitting: Internal Medicine

## 2018-01-24 ENCOUNTER — Ambulatory Visit: Payer: Medicare Other | Admitting: Internal Medicine

## 2018-01-24 ENCOUNTER — Encounter: Payer: Self-pay | Admitting: Internal Medicine

## 2018-01-24 VITALS — BP 126/70 | HR 66 | Ht 64.5 in | Wt 171.8 lb

## 2018-01-24 DIAGNOSIS — E785 Hyperlipidemia, unspecified: Secondary | ICD-10-CM | POA: Diagnosis not present

## 2018-01-24 DIAGNOSIS — E1121 Type 2 diabetes mellitus with diabetic nephropathy: Secondary | ICD-10-CM | POA: Diagnosis not present

## 2018-01-24 DIAGNOSIS — G629 Polyneuropathy, unspecified: Secondary | ICD-10-CM

## 2018-01-24 LAB — POCT GLYCOSYLATED HEMOGLOBIN (HGB A1C): Hemoglobin A1C: 6.6 % — AB (ref 4.0–5.6)

## 2018-01-24 NOTE — Progress Notes (Signed)
Patient ID: [name] [name], female   DOB: 05-May-1941, 77 y.o.   MRN: [id number]  HPI: [name] [name] is a 77 y.o.-year-old female, returning for follow-up for DM2, dx 77,4 non-insulin-dependent, now controlled, with complications (mild CKD, PN). Last visit 6 mo ago.  Last hemoglobin A1c was: Lab Results  Component Value Date   HGBA1C 6.9 (H) 11/12/2017   HGBA1C 6.5 (H) 05/11/2017   HGBA1C 6.0 08/28/2016   Pt is on a regimen of: - Invokana 100 mg in am - Metformin ER 500 mg 3x a day - Glipizide XL 5 mg before b'fast  Pt checks her sugars 0-1x a day: - A.m.: 106-125 >> 100-120 >> 120s,160 - 2 hours post breakfast:96, 139 >> n/c - Before lunch:  95-144 >> 126-133 >> n/c  - 2 hours post lunch:  95, 131, 187 (large lunch) >> 83>> n/c - before dinner:  n/c >> 93, 111 >> 79-153 >> n/c - 2 hours post dinner: 101, 103 >> n/c >> 140 >> n/c - Bedtime:  116-169 >> n/c >> 102 >> n/c Lowest: 79 (delayed meal) >> 100 >> 120. Highest: 153 >> 140 >> 160.  Pt's meals are: - Breakfast: apple + cottage cheese + whole wheat - Lunch: leftovers from dinner; sandwich; salad - Dinner: varies; some fast food - Snacks: veggies; chips Some diet sodas.  She continues to exercise- Silver sneakers at the [facility] 3 times a week , Tai Chi 1x a week.  -+ CKD, last BUN/creatinine:  Lab Results  Component Value Date   BUN 21 11/12/2017   CREATININE 1.19 (H) 11/12/2017  Not on an ACEI/ARB. -+ HL; last set of lipids: Lab Results  Component Value Date   CHOL 178 11/12/2017   HDL 34 (L) 11/12/2017   LDLCALC Comment 11/12/2017   TRIG 442 (H) 11/12/2017   CHOLHDL 5.2 (H) 11/12/2017  Not on a statin. - last eye exam was in 11/2016: No DR. + history of cataract surgery - +  numbness and tingling in her feet .  She is on B complex and alpha lipoic acid She used Lidocaine patches >> discontinued.  We started PN cream , but this was expensive (93$). Voltaren gel works a little better. Tried Neurontin >>  did not like it. She was reading about poss. SEs of Lyrica >> would not want to try it.  She had stool incontinence >> better after switching to metformin ER  She is a friend of Dr. Milta [name].  ROS: Constitutional: no weight gain/no weight loss, no fatigue, no subjective hyperthermia, no subjective hypothermia Eyes: no blurry vision, no xerophthalmia ENT: no sore throat, no nodules palpated in throat, no dysphagia, no odynophagia, no hoarseness Cardiovascular: no CP/no SOB/no palpitations/no leg swelling Respiratory: no cough/no SOB/no wheezing Gastrointestinal: no N/no V/no D/no C/+ acid reflux Musculoskeletal: no muscle aches/no joint aches Skin: no rashes, no hair loss Neurological: no tremors/no numbness/no tingling/no dizziness  I reviewed pt's medications, allergies, PMH, social hx, family hx, and changes were documented in the history of present illness. Otherwise, unchanged from my initial visit note.  Past Medical History:  Diagnosis Date  . Allergy    [name] [name]; allergy shots weekly.    . Anxiety   . Arthritis    DDD lumbar spine.    . Cancer ([place])    breast L x 2; skin basal cell carcinoma [name] [name]).  . 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 Diabetes mellitus   .  Hypertension   . IBS (irritable bowel syndrome)    diarrhea predominant.   Past Surgical History:  Procedure Laterality Date  . BREAST LUMPECTOMY  1996   L breast cancer  . MASTECTOMY  2002   Bilateral for breast cancer  . VAGINAL HYSTERECTOMY  1985   DUB; uterine fibroids; ovaries intact.   Social History   Socioeconomic History  . Marital status: Married    Spouse name: Not on file  . Number of children: Not on file  . Years of education: Not on file  . Highest education level: Not on file  Occupational History  . Occupation: retired  Scientific laboratory technician  . Financial resource strain: Not on file  . Food insecurity:    Worry: Not on file    Inability:  Not on file  . Transportation needs:    Medical: Not on file    Non-medical: Not on file  Tobacco Use  . Smoking status: Never Smoker  . Smokeless tobacco: Never Used  Substance and Sexual Activity  . Alcohol use: No    Alcohol/week: 0.0 oz    Comment: rare  . Drug use: No  . Sexual activity: Never  Lifestyle  . Physical activity:    Days per week: Not on file    Minutes per session: Not on file  . Stress: Not on file  Relationships  . Social connections:    Talks on phone: Not on file    Gets together: Not on file    Attends religious service: Not on file    Active member of club or organization: Not on file    Attends meetings of clubs or organizations: Not on file    Relationship status: Not on file  . Intimate partner violence:    Fear of current or ex partner: Not on file    Emotionally abused: Not on file    Physically abused: Not on file    Forced sexual activity: Not on file  Other Topics Concern  . 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.      Tobacco: never      Alcohol: rarely;       Exercise:  Silver Sneakers; Tai Chi once per week; exercises 3 days per week; balance class      ADLs: independent with ADLs; drives.  Husband does the cleaning and lifting groceries.      Advanced Directives: YES;  FULL CODE no prolonged measures.              Current Outpatient Medications on File Prior to Visit  Medication Sig Dispense Refill  . aspirin 81 MG tablet Take 81 mg by mouth daily.    . Blood Glucose Monitoring Suppl (ONE TOUCH ULTRA 2) W/DEVICE KIT Inject 1 application as directed as needed.    . dicyclomine (BENTYL) 10 MG capsule TAKE 1 CAPSULE 4 TIMES DAILY (BEFORE MEALS AND AT BEDTIME). 120 capsule 0  . fluticasone  (FLONASE) 50 MCG/ACT nasal spray Place 2 sprays into both nostrils daily. 48 g 3  . furosemide (LASIX) 20 MG tablet TAKE 1 OR 2 TABLETS DAILY AS DIRECTED. 180 tablet 1  . glipiZIDE (GLUCOTROL XL) 5 MG 24 hr tablet TAKE  1 TABLET EVERY DAY WITH BREAKFAST. 90 tablet 0  . INVOKANA 100 MG TABS tablet TAKE 1 TABLET ONCE DAILY. 90 tablet 0  . Lancets (FREESTYLE) lancets CHECK BLOOD SUGAR TWICE DAILY. 100 each 0  . lidocaine (LIDODERM) 5 % Place 1 patch onto the skin daily. Remove & Discard patch within 12 hours or as directed by MD 30 patch 5  . metFORMIN (GLUCOPHAGE-XR) 500 MG 24 hr tablet TAKE 1 TABLET 3 TIMES DAILY WITH MEALS. 270 tablet 0  . Misc Natural Products (TART CHERRY ADVANCED PO) Take 1 capsule by mouth daily.    . montelukast (SINGULAIR) 10 MG tablet Take 1 tablet (10 mg total) by mouth at bedtime. 90 tablet 3  . mupirocin ointment (BACTROBAN) 2 % Apply 1 application topically 3 (three) times daily. 30 g 1  . ONETOUCH VERIO test strip USE 1 TEST BLOOD SUGAR TWICE DAILY. 100 each 3  . oxybutynin (DITROPAN-XL) 10 MG 24 hr tablet Take 1 tablet (10 mg total) by mouth at bedtime. 90 tablet 1  . TOPROL XL 100 MG 24 hr tablet Take 1 tablet (100 mg total) by mouth 2 (two) times daily with a meal. Take with or immediately following a meal. 180 tablet 1  . XIFAXAN 550 MG TABS tablet TAKE 1 TABLET TWICE DAILY. (Patient taking differently: TAKE 1 TABLET TWICE DAILY. PRN) 60 tablet 0   No current facility-administered medications on file prior to visit.    Allergies  Allergen Reactions  . Fruit & Vegetable Daily [Nutritional Supplements] Diarrhea  . Onion Other (See Comments)    indigestion  . Tree Extract Swelling  . Sulfa Drugs Cross Reactors Rash    All over the body   Family History  Problem Relation Age of Onset  . Heart disease Mother        AMI as cause of death age 45.  Marland Kitchen Heart disease Father        AMI as cause of death.  . Heart disease Brother     PE: BP 126/70   Pulse 66    Ht 5' 4.5" (1.638 m)   Wt 171 lb 12.8 oz (77.9 kg)   SpO2 95%   BMI 29.03 kg/m  Body mass index is 29.03 kg/m. Wt Readings from Last 3 Encounters:  01/24/18 171 lb 12.8 oz (77.9 kg)  11/12/17 170 lb (77.1 kg)  07/13/17 168 lb 12.8 oz (76.6 kg)   Constitutional: overweight, in NAD Eyes: PERRLA, EOMI, no exophthalmos ENT: moist mucous membranes, no thyromegaly, no cervical lymphadenopathy Cardiovascular: RRR, No MRG Respiratory: CTA B Gastrointestinal: abdomen soft, NT, ND, BS+ Musculoskeletal: no deformities, strength intact in all 4 Skin: moist, warm, no rashes Neurological: + tremor with outstretched hands, DTR normal in all 4  ASSESSMENT: 1. 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 HL  PLAN:  1. Patient with well-controlled diabetes, on oral antidiabetic regimen with metformin, glipizide and SGLT2 inhibitor (Invokana).  At last visit sugars were at goal in the recent HbA1c was 6.5%.  This was slightly higher compared to before but still at goal.  At last visit, she was not checking her sugars so we did not change her regimen but I strongly advised her to start checking once a day and let me know if they are increasing.  - 2.5 months ago she had another HbA1c checked and this was even  higher, is 6.9% - At this visit, sugars are still at goal with 1 exception, but she only checks in the morning.  However, today, HbA1c is 6.6% (better) -In this case, I advised her to continue her current regimen - I advised her to: Patient Instructions  Please continue: - Invokana 100 mg in am - Metformin ER 500 mg 3x a day - Glipizide XL 5 mg before b'fast  Please come back for a follow-up appointment in 6  months with your sugar log.  - Check sugars at different times of the day - check 1x a day, rotating checks - advised for yearly eye exams >> she is UTD - Return to clinic  in 6 mo with sugar log    2. PN - continues on: - alpha-lipoic acid 600 mg bid - fothiamine - stable  3. HL - Reviewed latest lipid panel from 11/2017: LDL not calc. 2/2 high TG Lab Results  Component Value Date   CHOL 178 11/12/2017   HDL 34 (L) 11/12/2017   LDLCALC Comment 11/12/2017   TRIG 442 (H) 11/12/2017   CHOLHDL 5.2 (H) 11/12/2017  - not on a statin  - will start omega 3 FAs >> 1000 mg 2x a day  [name], MD PhD [facility] Endocrinology

## 2018-01-24 NOTE — Patient Instructions (Addendum)
Please continue;: - Invokana 100 mg in am - Metformin ER 500 mg 3x a day - Glipizide XL 5 mg before b'fast  Start Fish oil 1000 mg 2x a day.  Please come back for a follow-up appointment in 6  months with your sugar log.

## 2018-01-24 NOTE — Addendum Note (Signed)
Addended by: Drucilla Schmidt on: 01/24/2018 02:30 PM   Modules accepted: Orders

## 2018-02-01 ENCOUNTER — Other Ambulatory Visit: Payer: Self-pay | Admitting: Family Medicine

## 2018-02-21 ENCOUNTER — Other Ambulatory Visit: Payer: Self-pay | Admitting: Internal Medicine

## 2018-02-21 ENCOUNTER — Other Ambulatory Visit: Payer: Self-pay | Admitting: Family Medicine

## 2018-03-18 ENCOUNTER — Other Ambulatory Visit: Payer: Self-pay | Admitting: Family Medicine

## 2018-03-18 NOTE — Telephone Encounter (Signed)
xifaxan 550 mg refill Last Refill:03/02/17 # 60   Prescription expired on 03/02/18. Last OV: 11/22/17 PCP: former pt of Reginia Forts. Pt has an appointment with Dr, Pamella Pert on 05/16/18. Pharmacy: [facility]

## 2018-03-20 NOTE — Telephone Encounter (Signed)
Patient is requesting a refill of the following medications: Requested Prescriptions   Pending Prescriptions Disp Refills  . XIFAXAN 550 MG TABS tablet [Pharmacy Med Name: XIFAXAN 550 MG TABLET] 60 tablet 0    Sig: TAKE 1 TABLET BY MOUTH TWICE DAILY.    Date of patient request: 03/18/2018 Last office visit: 11/12/2017 Date of last refill: 03/02/2017 Last refill amount: 60 Follow up time period per chart: has apt with dr. Pamella Pert in Nov.

## 2018-03-27 ENCOUNTER — Other Ambulatory Visit: Payer: Self-pay | Admitting: Internal Medicine

## 2018-03-27 ENCOUNTER — Telehealth: Payer: Self-pay | Admitting: Family Medicine

## 2018-03-27 NOTE — Telephone Encounter (Signed)
Dicyclomine 10 mg refill Last Refill:03/17/18 # 68 Last OV: 11/12/17 PCP: former Tamala Julian- scheduled Pamella Pert 05/16/18 Pharmacy:[facility]

## 2018-03-29 NOTE — Telephone Encounter (Signed)
PA for XIFAXAN 550 MG tablet has been approved. Called pt and left VM advising her to call her pharmacy and she should be able to pick it up.

## 2018-03-29 NOTE — Telephone Encounter (Signed)
Former pt of Dr. Thompson Caul has an appt scheduled for 05/16/18 with you. Thank you.

## 2018-04-01 NOTE — Telephone Encounter (Signed)
Keri calling from [facility] states that the patient is  requesting a 90 supply of the dicyclomine (BENTYL) 10 MG capsule . Can a new Rx be sent to   [place], [place] [phone] (Phone) [phone] (Fax

## 2018-04-02 ENCOUNTER — Other Ambulatory Visit: Payer: Self-pay | Admitting: *Deleted

## 2018-04-02 MED ORDER — DICYCLOMINE HCL 10 MG PO CAPS: ORAL_CAPSULE | ORAL | 0 refills | Status: DC

## 2018-04-02 NOTE — Telephone Encounter (Signed)
° ° °  Pharmacy is still waiting to see if RX can be change to 90 day supply since all pt medicines are link to pick up at the same time

## 2018-04-09 ENCOUNTER — Telehealth: Payer: Self-pay | Admitting: General Practice

## 2018-04-09 NOTE — Telephone Encounter (Signed)
Called pt. To reschedule visit with Dr. Pamella Pert on 05/16/18. Left detailed VM to call back. If pt. Calls back please reschedule.

## 2018-05-13 ENCOUNTER — Encounter: Payer: Self-pay | Admitting: Family Medicine

## 2018-05-13 ENCOUNTER — Other Ambulatory Visit: Payer: Self-pay

## 2018-05-13 ENCOUNTER — Ambulatory Visit: Payer: Medicare Other | Admitting: Family Medicine

## 2018-05-13 VITALS — BP 138/78 | HR 78 | Temp 98.5°F | Resp 14 | Ht 64.0 in | Wt 169.6 lb

## 2018-05-13 DIAGNOSIS — I10 Essential (primary) hypertension: Secondary | ICD-10-CM

## 2018-05-13 DIAGNOSIS — E1121 Type 2 diabetes mellitus with diabetic nephropathy: Secondary | ICD-10-CM

## 2018-05-13 DIAGNOSIS — G4700 Insomnia, unspecified: Secondary | ICD-10-CM | POA: Diagnosis not present

## 2018-05-13 DIAGNOSIS — E785 Hyperlipidemia, unspecified: Secondary | ICD-10-CM | POA: Diagnosis not present

## 2018-05-13 MED ORDER — TRAZODONE HCL 50 MG PO TABS: 25.0000 mg | ORAL_TABLET | Freq: Every evening | ORAL | 3 refills | Status: DC | PRN

## 2018-05-13 NOTE — Progress Notes (Addendum)
11/4/20193:19 PM  [name] [name] 1941-03-21, 77 y.o. female [id number]  Chief Complaint  Patient presents with  . Follow-up    diabetes    HPI:   Patient is a 77 y.o. female with past medical history significant for DM2,  who presents today for DM2, HTN, IBS, GERD  Previous PCP Dr Tamala Julian Last visit may 2019  Takes metformin, invokana glipizide Checks cbgs occasionally, 112-160 Denies any sx of hypoglycemia Sees Dr Katy Fitch, sometime early summer 2019, patient reports no retinopathy Sees Dr Letta Median for DM, has upcoming appt  Does not check bp at home  Requesting prescription for sonata, has used before, has intermittent problems with falling asleep.  Reports no excessive caffeine, no issues with stress or anxiety Has tried [place] - worked, prefers [place] as feels shorter lived Currently taking melatonin Has never tried trazadone  Does silver sneakers and [place] chi   Lab Results  Component Value Date   HGBA1C 6.6 (A) 01/24/2018   HGBA1C 6.9 (H) 11/12/2017   HGBA1C 6.5 (H) 05/11/2017   Lab Results  Component Value Date   MICROALBUR 0.2 05/02/2016   [place] Comment 11/12/2017   CREATININE 1.19 (H) 11/12/2017   Lab Results  Component Value Date   CHOL 178 11/12/2017   HDL 34 (L) 11/12/2017   LDLCALC Comment 11/12/2017   TRIG 442 (H) 11/12/2017   CHOLHDL 5.2 (H) 11/12/2017  nofasting  Fall Risk  11/12/2017 05/11/2017 05/07/2017 11/01/2016 10/05/2016  Falls in the past year? No Yes Yes Yes Yes  Number falls in past yr: - _0 Injury with Fall? - No - - No  Risk for fall due to : - Impaired balance/gait - - -  Risk for fall due to: Comment - Patient has balance issues - - -  Follow up - Falls prevention discussed - - -     Depression screen [facility] 2/9 11/12/2017 05/11/2017 05/07/2017  Decreased Interest 0 0 0  Down, Depressed, Hopeless 0 0 0  PHQ - 2 Score 0 0 0    Allergies  Allergen Reactions  . Fruit & Vegetable Daily [Nutritional Supplements] Diarrhea    . Onion Other (See Comments)    indigestion  . Tree Extract Swelling  . Sulfa Drugs Cross Reactors Rash    All over the body    Prior to Admission medications   Medication Sig Start Date End Date Taking? Authorizing Provider  aspirin 81 MG tablet Take 81 mg by mouth daily.   Yes [provider]  Blood Glucose Monitoring Suppl (ONE TOUCH ULTRA 2) W/DEVICE KIT Inject 1 application as directed as needed. 08/13/14  Yes [provider]  dicyclomine (BENTYL) 10 MG capsule Take 1 capsule 4 times daily (before meals and at bedtime) 04/02/18  Yes Rutherford Guys, MD  fluticasone [facility]) 50 MCG/ACT nasal spray Place 2 sprays into both nostrils daily. 11/12/17  Yes Wardell Honour, MD  furosemide (LASIX) 20 MG tablet TAKE 1 OR 2 TABLETS DAILY AS DIRECTED. 11/12/17  Yes Wardell Honour, MD  glipiZIDE (GLUCOTROL XL) 5 MG 24 hr tablet TAKE 1 TABLET EVERY DAY WITH BREAKFAST. 03/27/18  Yes [name], MD  INVOKANA 100 MG TABS tablet TAKE 1 TABLET ONCE DAILY. 02/21/18  Yes Forrest Moron, MD  Lancets (FREESTYLE) lancets CHECK BLOOD SUGAR TWICE DAILY. 01/01/18  Yes [name], MD  metFORMIN (GLUCOPHAGE-XR) 500 MG 24 hr tablet TAKE 1 TABLET 3 TIMES DAILY WITH MEALS. 02/21/18  Yes [name], MD  Misc Natural Products (TART CHERRY ADVANCED PO) Take 1 capsule by mouth daily.   Yes [provider]  montelukast (SINGULAIR) 10 MG tablet Take 1 tablet (10 mg total) by mouth at bedtime. 11/12/17  Yes Wardell Honour, MD  [facility] VERIO test strip USE 1 TEST BLOOD SUGAR TWICE DAILY. 01/01/18  Yes [name], MD  oxybutynin (DITROPAN-XL) 10 MG 24 hr tablet Take 1 tablet (10 mg total) by mouth at bedtime. 11/12/17  Yes Wardell Honour, MD  rifaximin (XIFAXAN) 550 MG TABS tablet TAKE 1 TABLET TWICE DAILY. PRN 03/25/18  Yes Stallings, Arlie Solomons, MD  TOPROL XL 100 MG 24 hr tablet Take 1 tablet (100 mg total) by mouth 2 (two) times daily with a meal. Take with or immediately following  a meal. 11/12/17  Yes Wardell Honour, MD  lidocaine (LIDODERM) 5 % Place 1 patch onto the skin daily. Remove & Discard patch within 12 hours or as directed by MD Patient not taking: Reported on 05/13/2018 11/27/17   Wardell Honour, MD  mupirocin ointment (BACTROBAN) 2 % Apply 1 application topically 3 (three) times daily. Patient not taking: Reported on 05/13/2018 05/07/17   Wardell Honour, MD    Past Medical History:  Diagnosis Date  . Allergy    [name]; allergy shots weekly.    . Anxiety   . Arthritis    DDD lumbar spine.    . Cancer ([place])    breast L x 2; skin basal cell carcinoma [name]).  . 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 Diabetes mellitus   . Hypertension   . IBS (irritable bowel syndrome)    diarrhea predominant.    Past Surgical History:  Procedure Laterality Date  . BREAST LUMPECTOMY  1996   L breast cancer  . MASTECTOMY  2002   Bilateral for breast cancer  . VAGINAL HYSTERECTOMY  1985   DUB; uterine fibroids; ovaries intact.    Social History   Tobacco Use  . Smoking status: Never Smoker  . Smokeless tobacco: Never Used  Substance Use Topics  . Alcohol use: No    Alcohol/week: 0.0 standard drinks    Comment: rare    Family History  Problem Relation Age of Onset  . Heart disease Mother        AMI as cause of death age 78.  Marland Kitchen Heart disease Father        AMI as cause of death.  . Heart disease Brother     Review of Systems  Constitutional: Negative for chills and fever.  Respiratory: Positive for cough. Negative for shortness of breath.   Cardiovascular: Negative for chest pain, palpitations and leg swelling.  Gastrointestinal: Negative for abdominal pain, nausea and vomiting.     OBJECTIVE:  Blood pressure 138/78, pulse 78, temperature 98.5 F (36.9 C), temperature source Oral, resp. rate 14, height _0  (1.626 m), weight 169 lb 9.6 oz (76.9 kg), SpO2 94 %. Body mass index is 29.11 kg/m.    Wt  Readings from Last 3 Encounters:  05/13/18 169 lb 9.6 oz (76.9 kg)  01/24/18 171 lb 12.8 oz (77.9 kg)  11/12/17 170 lb (77.1 kg)    Physical Exam  Constitutional: She is oriented to person, place, and time. She appears well-developed and well-nourished.  HENT:  Head: Normocephalic and atraumatic.  Mouth/Throat: Oropharynx is clear and moist. No oropharyngeal exudate.  Eyes: Pupils are equal, round, and reactive to light.  Conjunctivae and EOM are normal. No scleral icterus.  Neck: Neck supple.  Cardiovascular: Normal rate, regular rhythm and normal heart sounds. Exam reveals no gallop and no friction rub.  No murmur heard. Pulmonary/Chest: Effort normal and breath sounds normal. She has no wheezes. She has no rales.  Musculoskeletal: She exhibits no edema.  Neurological: She is alert and oriented to person, place, and time.  Skin: Skin is warm and dry.  Psychiatric: She has a normal mood and affect.  Nursing note and vitals reviewed.  Diabetic Foot Exam - Simple   Simple Foot Form Visual Inspection No deformities, no ulcerations, no other skin breakdown bilaterally:  Yes Sensation Testing Intact to touch and monofilament testing bilaterally:  Yes Pulse Check Posterior Tibialis and Dorsalis pulse intact bilaterally:  Yes Comments    ASSESSMENT and PLAN  1. Controlled type 2 diabetes mellitus with diabetic nephropathy, without long-term current use of insulin ([place]) Per last a1c controlled. Managed by endo.  - HM Diabetes Foot Exam - Microalbumin/Creatinine Ratio, Urine; Future  2. Dyslipidemia Checking labs today, medications will be adjusted as needed.  - Lipid panel; Future - Comprehensive metabolic panel; Future  3. Essential hypertension Controlled. Continue current regime.  - Care order/instruction:  4. Insomnia, unspecified type Uncontrolled. Currently on melatonin. Will fo trial of trazodone, r/se/b reviewed  Other orders - traZODone (DESYREL) 50 MG tablet;  Take 0.5-1 tablets (25-50 mg total) by mouth at bedtime as needed for sleep.  Return in about 6 months (around 11/11/2018).    Rutherford Guys, MD [facility] [place], [place] [address] Ph.  [phone] Fax [phone]

## 2018-05-13 NOTE — Patient Instructions (Addendum)
Please come back in about a week for fasting labs   If you have lab work done today you will be contacted with your lab results within the next 2 weeks.  If you have not heard from [place] then please contact us. The fastest way to get your results is to register for My Chart.   IF you received an x-ray today, you will receive an invoice from [facility]. Please contact [facility] at [phone] with questions or concerns regarding your invoice.   IF you received labwork today, you will receive an invoice from [facility]. Please contact [facility] at [phone] with questions or concerns regarding your invoice.   Our billing staff will not be able to assist you with questions regarding bills from these companies.  You will be contacted with the lab results as soon as they are available. The fastest way to get your results is to activate your My Chart account. Instructions are located on the last page of this paperwork. If you have not heard from [place] regarding the results in 2 weeks, please contact this office.     Insomnia Insomnia is a sleep disorder that makes it difficult to fall asleep or to stay asleep. Insomnia can cause tiredness (fatigue), low energy, difficulty concentrating, mood swings, and poor performance at work or school. There are three different ways to classify insomnia:  Difficulty falling asleep.  Difficulty staying asleep.  Waking up too early in the morning.  Any type of insomnia can be long-term (chronic) or short-term (acute). Both are common. Short-term insomnia usually lasts for three months or less. Chronic insomnia occurs at least three times a week for longer than three months. What are the causes? Insomnia may be caused by another condition, situation, or substance, such as:  Anxiety.  Certain medicines.  Gastroesophageal reflux disease (GERD) or other gastrointestinal conditions.  Asthma or other breathing conditions.  Restless legs  syndrome, sleep apnea, or other sleep disorders.  Chronic pain.  Menopause. This may include hot flashes.  Stroke.  Abuse of alcohol, tobacco, or illegal drugs.  Depression.  Caffeine.  Neurological disorders, such as Alzheimer disease.  An overactive thyroid (hyperthyroidism).  The cause of insomnia may not be known. What increases the risk? Risk factors for insomnia include:  Gender. Women are more commonly affected than men.  Age. Insomnia is more common as you get older.  Stress. This may involve your professional or personal life.  Income. Insomnia is more common in people with lower income.  Lack of exercise.  Irregular work schedule or night shifts.  Traveling between different time zones.  What are the signs or symptoms? If you have insomnia, trouble falling asleep or trouble staying asleep is the main symptom. This may lead to other symptoms, such as:  Feeling fatigued.  Feeling nervous about going to sleep.  Not feeling rested in the morning.  Having trouble concentrating.  Feeling irritable, anxious, or depressed.  How is this treated? Treatment for insomnia depends on the cause. If your insomnia is caused by an underlying condition, treatment will focus on addressing the condition. Treatment may also include:  Medicines to help you sleep.  Counseling or therapy.  Lifestyle adjustments.  Follow these instructions at home:  Take medicines only as directed by your health care provider.  Keep regular sleeping and waking hours. Avoid naps.  Keep a sleep diary to help you and your health care provider figure out what could be causing your insomnia. Include: ? When you sleep. ?  When you wake up during the night. ? How well you sleep. ? How rested you feel the next day. ? Any side effects of medicines you are taking. ? What you eat and drink.  Make your bedroom a comfortable place where it is easy to fall asleep: ? Put up shades or special  blackout curtains to block light from outside. ? Use a white noise machine to block noise. ? Keep the temperature cool.  Exercise regularly as directed by your health care provider. Avoid exercising right before bedtime.  Use relaxation techniques to manage stress. Ask your health care provider to suggest some techniques that may work well for you. These may include: ? Breathing exercises. ? Routines to release muscle tension. ? Visualizing peaceful scenes.  Cut back on alcohol, caffeinated beverages, and cigarettes, especially close to bedtime. These can disrupt your sleep.  Do not overeat or eat spicy foods right before bedtime. This can lead to digestive discomfort that can make it hard for you to sleep.  Limit screen use before bedtime. This includes: ? Watching TV. ? Using your smartphone, tablet, and computer.  Stick to a routine. This can help you fall asleep faster. Try to do a quiet activity, brush your teeth, and go to bed at the same time each night.  Get out of bed if you are still awake after 15 minutes of trying to sleep. Keep the lights down, but try reading or doing a quiet activity. When you feel sleepy, go back to bed.  Make sure that you drive carefully. Avoid driving if you feel very sleepy.  Keep all follow-up appointments as directed by your health care provider. This is important. Contact a health care provider if:  You are tired throughout the day or have trouble in your daily routine due to sleepiness.  You continue to have sleep problems or your sleep problems get worse. Get help right away if:  You have serious thoughts about hurting yourself or someone else. This information is not intended to replace advice given to you by your health care provider. Make sure you discuss any questions you have with your health care provider. Document Released: 06/23/2000 Document Revised: 11/26/2015 Document Reviewed: 03/27/2014 Elsevier Interactive Patient Education   [employer].

## 2018-05-16 ENCOUNTER — Ambulatory Visit (INDEPENDENT_AMBULATORY_CARE_PROVIDER_SITE_OTHER): Payer: Medicare Other | Admitting: Family Medicine

## 2018-05-16 ENCOUNTER — Ambulatory Visit: Payer: Medicare Other | Admitting: Family Medicine

## 2018-05-16 ENCOUNTER — Other Ambulatory Visit: Payer: Self-pay | Admitting: Family Medicine

## 2018-05-16 DIAGNOSIS — E785 Hyperlipidemia, unspecified: Secondary | ICD-10-CM

## 2018-05-16 DIAGNOSIS — E1121 Type 2 diabetes mellitus with diabetic nephropathy: Secondary | ICD-10-CM

## 2018-05-16 NOTE — Progress Notes (Signed)
Lab only 

## 2018-05-17 LAB — MICROALBUMIN / CREATININE URINE RATIO
Creatinine, Urine: 42.8 mg/dL
Microalb/Creat Ratio: 18.9 mg/g creat (ref 0.0–30.0)
Microalbumin, Urine: 8.1 ug/mL

## 2018-05-17 LAB — COMPREHENSIVE METABOLIC PANEL
ALT: 14 IU/L (ref 0–32)
AST: 14 IU/L (ref 0–40)
Albumin/Globulin Ratio: 2.2 (ref 1.2–2.2)
Albumin: 4.3 g/dL (ref 3.5–4.8)
Alkaline Phosphatase: 77 IU/L (ref 39–117)
BUN/Creatinine Ratio: 14 (ref 12–28)
BUN: 18 mg/dL (ref 8–27)
Bilirubin Total: 0.3 mg/dL (ref 0.0–1.2)
CO2: 20 mmol/L (ref 20–29)
Calcium: 9.4 mg/dL (ref 8.7–10.3)
Chloride: 104 mmol/L (ref 96–106)
Creatinine, Ser: 1.33 mg/dL — ABNORMAL HIGH (ref 0.57–1.00)
GFR calc Af Amer: 45 mL/min/{1.73_m2} — ABNORMAL LOW (ref >59)
GFR calc non Af Amer: 39 mL/min/{1.73_m2} — ABNORMAL LOW (ref >59)
Globulin, Total: 2 g/dL (ref 1.5–4.5)
Glucose: 154 mg/dL — ABNORMAL HIGH (ref 65–99)
Potassium: 4.5 mmol/L (ref 3.5–5.2)
Sodium: 141 mmol/L (ref 134–144)
Total Protein: 6.3 g/dL (ref 6.0–8.5)

## 2018-05-17 LAB — LIPID PANEL
Chol/HDL Ratio: 4.9 ratio — ABNORMAL HIGH (ref 0.0–4.4)
Cholesterol, Total: 168 mg/dL (ref 100–199)
HDL: 34 mg/dL — ABNORMAL LOW (ref >39)
LDL Calculated: 62 mg/dL (ref 0–99)
Triglycerides: 360 mg/dL — ABNORMAL HIGH (ref 0–149)
VLDL Cholesterol Cal: 72 mg/dL — ABNORMAL HIGH (ref 5–40)

## 2018-06-07 ENCOUNTER — Telehealth: Payer: Self-pay | Admitting: Family Medicine

## 2018-06-07 MED ORDER — ZALEPLON 5 MG PO CAPS: 5.0000 mg | ORAL_CAPSULE | Freq: Every evening | ORAL | 2 refills | Status: DC | PRN

## 2018-06-07 NOTE — Telephone Encounter (Signed)
Trial of trazodone done Has been on this medication before Med refilled

## 2018-06-07 NOTE — Telephone Encounter (Signed)
Copied from [place] [id number]. Topic: Quick Communication - Rx Refill/Question >> Jun 07, 2018 11:56 AM Alanda Slim E wrote: Medication: zaleplon (SONATA) 5 MG capsule  Pt stated she asked Dr. Pamella Pert for zaleplon (SONATA) 5 MG capsule.  Does Pt needs call back to schedule with Dr. Pamella Pert for this medication?  Has the patient contacted their pharmacy? No.   Preferred Pharmacy (with phone number or street name): [place], [place]. [phone] (Phone) [phone] (Fax)

## 2018-06-07 NOTE — Telephone Encounter (Signed)
Please advise 

## 2018-06-07 NOTE — Telephone Encounter (Signed)
Patient is requesting a medication written by outside provider in 2014  Rx request: zaleplon 5 mg  LOV: 05/16/18  PCP: [place]: verified

## 2018-06-10 ENCOUNTER — Other Ambulatory Visit: Payer: Self-pay | Admitting: Family Medicine

## 2018-06-10 ENCOUNTER — Other Ambulatory Visit: Payer: Self-pay | Admitting: Internal Medicine

## 2018-06-17 ENCOUNTER — Other Ambulatory Visit: Payer: Self-pay | Admitting: Family Medicine

## 2018-06-19 NOTE — Telephone Encounter (Signed)
PA was denied for Zaleplon 5 mg due to not meeting the PA requirement(s).  Medication authorization requires the following: 1. You have a history of failure, contraindication, or intolerance to both of the following:  A. Belsomra AND  B. Rozerem  Please advise

## 2018-06-20 MED ORDER — SUVOREXANT 10 MG PO TABS: 10.0000 mg | ORAL_TABLET | Freq: Every day | ORAL | 2 refills | Status: DC

## 2018-06-20 NOTE — Telephone Encounter (Signed)
Please let patient know that her insurance wants her to try 2 other sleeping meds before being able to do a prior auth Sending rx for belsomra thanks

## 2018-06-21 NOTE — Telephone Encounter (Signed)
Contacting pt via phone advised that her insurance wants her to try 2 other sleeping medications before being able to do prior authorization and she is sending in  [place].  Pt verbalized understanding an advised to call back if she has any further questions or concerns. Dgaddy, CMA

## 2018-07-08 ENCOUNTER — Telehealth: Payer: Self-pay | Admitting: Family Medicine

## 2018-07-08 NOTE — Telephone Encounter (Signed)
Please Advise

## 2018-07-08 NOTE — Telephone Encounter (Signed)
PA for Zaleplon 5MG  capsules was denied.  I placed form in provider's box at Nurse's station.  Thank you!

## 2018-07-08 NOTE — Telephone Encounter (Signed)
Noted  

## 2018-07-22 LAB — HM DEXA SCAN

## 2018-07-24 NOTE — Progress Notes (Signed)
Low frax score

## 2018-07-29 ENCOUNTER — Encounter: Payer: Self-pay | Admitting: Internal Medicine

## 2018-07-29 ENCOUNTER — Ambulatory Visit: Payer: Medicare Other | Admitting: Internal Medicine

## 2018-07-29 VITALS — BP 120/70 | HR 69 | Ht 64.0 in | Wt 166.0 lb

## 2018-07-29 DIAGNOSIS — E1121 Type 2 diabetes mellitus with diabetic nephropathy: Secondary | ICD-10-CM

## 2018-07-29 DIAGNOSIS — G629 Polyneuropathy, unspecified: Secondary | ICD-10-CM | POA: Diagnosis not present

## 2018-07-29 DIAGNOSIS — E785 Hyperlipidemia, unspecified: Secondary | ICD-10-CM

## 2018-07-29 LAB — POCT GLYCOSYLATED HEMOGLOBIN (HGB A1C): Hemoglobin A1C: 6.8 % — AB (ref 4.0–5.6)

## 2018-07-29 NOTE — Patient Instructions (Signed)
Please continue: - Invokana 100 mg in am - Metformin ER 500 mg 3x a day - Glipizide XL 5 mg before b'fast  Please come back for a follow-up appointment in 6 months with your sugar log.

## 2018-07-29 NOTE — Addendum Note (Signed)
Addended by: Cardell Peach I on: 07/29/2018 01:40 PM   Modules accepted: Orders

## 2018-07-29 NOTE — Progress Notes (Signed)
Patient ID: [name] [name], female   DOB: [birth year], 78 y.o.   MRN: [id number]  HPI: [name] [name] is a 78 y.o.-year-old female, returning for follow-up for DM2, dx 2014, non-insulin-dependent, now controlled, with complications (mild CKD, PN). Last visit 6 months ago.  Last hemoglobin A1c was: Lab Results  Component Value Date   HGBA1C 6.6 (A) 01/24/2018   HGBA1C 6.9 (H) 11/12/2017   HGBA1C 6.5 (H) 05/11/2017   Pt is on a regimen of: - Invokana 100 mg in am - Metformin ER 500 mg 3x a day - Glipizide XL 5 mg before b'fast  Pt checks her sugars 0-1x a day: - A.m.: 106-125 >> 100-120 >> 120s,160 >> 123-160 - 2 hours post breakfast:96, 139 >> n/c - Before lunch:  95-144 >> 126-133 >> n/c  - 2 hours post lunch:  95, 131, 187 (large lunch) >> 83 >> n/c - before dinner:  n/c >> 93, 111 >> 79-153 >> n/c - 2 hours post dinner: 101, 103 >> n/c >> 140 >> n/c - Bedtime:  116-169 >> n/c >> 102 >> n/c Lowest: 79 >> 100 >> 120 >> 123. Highest: 153 >> 140 >> 160 >> 160.  Pt's meals are: - Breakfast: apple + cottage cheese + whole wheat - Lunch: leftovers from dinner; sandwich; salad - Dinner: varies; some fast food - Snacks: veggies; chips Some diet sodas.  She continues to exercise- Silver sneakers at the [facility] 3 times a week , Tai Chi 1x a week.  -+ CKD, last BUN/creatinine:  Lab Results  Component Value Date   BUN 18 05/16/2018   CREATININE 1.33 (H) 05/16/2018  Not on ACE inhibitor/ARB. -+ HL; last set of lipids: Lab Results  Component Value Date   CHOL 168 05/16/2018   HDL 34 (L) 05/16/2018   LDLCALC 62 05/16/2018   TRIG 360 (H) 05/16/2018   CHOLHDL 4.9 (H) 05/16/2018  Not taking a statin, but takes omega-3 fatty acids 1000 mg 2x a day. - last eye exam was in 11/2017: No DR; + history of cataract surgery -She has numbness and tingling in her feet .  She is on B complex and alpha lipoic acid She used Lidocaine patches >> discontinued.  We started PN cream , but this  was expensive (93$). Voltaren gel works a little better. Tried Neurontin >> did not like it. She was reading about poss. SEs of Lyrica >> would not want to try it.  She had stool incontinence >> better after switching to metformin ER  She is a friend of Dr. Milta [name].  ROS: Constitutional: no weight gain/no weight loss, no fatigue, no subjective hyperthermia, no subjective hypothermia Eyes: no blurry vision, no xerophthalmia ENT: no sore throat, no nodules palpated in neck, no dysphagia, no odynophagia, no hoarseness Cardiovascular: no CP/no SOB/no palpitations/no leg swelling Respiratory: no cough/no SOB/no wheezing Gastrointestinal: no N/no V/no D/no C/no acid reflux Musculoskeletal: no muscle aches/no joint aches Skin: no rashes, no hair loss Neurological: no tremors/no numbness/no tingling/no dizziness  I reviewed pt's medications, allergies, PMH, social hx, family hx, and changes were documented in the history of present illness. Otherwise, unchanged from my initial visit note.  Past Medical History:  Diagnosis Date  . Allergy    [name]; allergy shots weekly.    . Anxiety   . Arthritis    DDD lumbar spine.    . Cancer ([place])    breast L x 2; skin basal cell carcinoma [name]).  . 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 Diabetes mellitus   . Hypertension   . IBS (irritable bowel syndrome)    diarrhea predominant.   Past Surgical History:  Procedure Laterality Date  . BREAST LUMPECTOMY  1996   L breast cancer  . MASTECTOMY  2002   Bilateral for breast cancer  . VAGINAL HYSTERECTOMY  1985   DUB; uterine fibroids; ovaries intact.   Social History   Socioeconomic History  . Marital status: Married    Spouse name: Not on file  . Number of children: Not on file  . Years of education: Not on file  . Highest education level: Not on file  Occupational History  . Occupation: retired  Scientific laboratory technician  . Financial resource  strain: Not on file  . Food insecurity:    Worry: Not on file    Inability: Not on file  . Transportation needs:    Medical: Not on file    Non-medical: Not on file  Tobacco Use  . Smoking status: Never Smoker  . Smokeless tobacco: Never Used  Substance and Sexual Activity  . Alcohol use: No    Alcohol/week: 0.0 standard drinks    Comment: rare  . Drug use: No  . Sexual activity: Never  Lifestyle  . Physical activity:    Days per week: Not on file    Minutes per session: Not on file  . Stress: Not on file  Relationships  . Social connections:    Talks on phone: Not on file    Gets together: Not on file    Attends religious service: Not on file    Active member of club or organization: Not on file    Attends meetings of clubs or organizations: Not on file    Relationship status: Not on file  . Intimate partner violence:    Fear of current or ex partner: Not on file    Emotionally abused: Not on file    Physically abused: Not on file    Forced sexual activity: Not on file  Other Topics Concern  . 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.      Tobacco: never      Alcohol: rarely;       Exercise:  Silver Sneakers; Tai Chi once per week; exercises 3 days per week; balance class      ADLs: independent with ADLs; drives.  Husband does the cleaning and lifting groceries.      Advanced Directives: YES;  FULL CODE no prolonged measures.              Current Outpatient Medications on File Prior to Visit  Medication Sig Dispense Refill  . aspirin 81 MG tablet Take 81 mg by mouth daily.    . Blood Glucose Monitoring Suppl (ONE TOUCH ULTRA 2) W/DEVICE KIT Inject 1 application as directed as needed.    . dicyclomine (BENTYL) 10 MG capsule TAKE  1 CAPSULE 4 TIMES DAILY (BEFORE MEALS AND AT BEDTIME). 360 capsule 0  . fluticasone (FLONASE) 50 MCG/ACT nasal spray Place 2 sprays into both nostrils daily. 48 g 3  . furosemide (LASIX) 20 MG tablet TAKE 1 OR 2 TABLETS DAILY AS  DIRECTED. 180 tablet 0  . glipiZIDE (GLUCOTROL XL) 5 MG 24 hr tablet TAKE 1 TABLET EVERY DAY WITH BREAKFAST. 90 tablet 0  . INVOKANA 100 MG TABS tablet TAKE 1 TABLET ONCE DAILY. 90 tablet 0  . Lancets (FREESTYLE) lancets CHECK BLOOD SUGAR TWICE DAILY. 100 each 0  . lidocaine (LIDODERM) 5 % Place 1 patch onto the skin daily. Remove & Discard patch within 12 hours or as directed by MD (Patient not taking: Reported on 05/13/2018) 30 patch 5  . metFORMIN (GLUCOPHAGE-XR) 500 MG 24 hr tablet TAKE 1 TABLET 3 TIMES DAILY WITH MEALS. 270 tablet 0  . Misc Natural Products (TART CHERRY ADVANCED PO) Take 1 capsule by mouth daily.    . montelukast (SINGULAIR) 10 MG tablet TAKE ONE TABLET AT BEDTIME. 90 tablet 0  . mupirocin ointment (BACTROBAN) 2 % Apply 1 application topically 3 (three) times daily. (Patient not taking: Reported on 05/13/2018) 30 g 1  . ONETOUCH VERIO test strip USE 1 TEST BLOOD SUGAR TWICE DAILY. 100 each 3  . oxybutynin (DITROPAN-XL) 10 MG 24 hr tablet TAKE ONE TABLET AT BEDTIME. 90 tablet 0  . rifaximin (XIFAXAN) 550 MG TABS tablet TAKE 1 TABLET TWICE DAILY. PRN 60 tablet 0  . Suvorexant (BELSOMRA) 10 MG TABS Take 10 mg by mouth at bedtime. 30 tablet 2  . TOPROL XL 100 MG 24 hr tablet TAKE 1 TABLET TWICE DAILY WITH FOOD. 180 tablet 0   No current facility-administered medications on file prior to visit.    Allergies  Allergen Reactions  . Fruit & Vegetable Daily [Nutritional Supplements] Diarrhea  . Onion Other (See Comments)    indigestion  . Tree Extract Swelling  . Sulfa Drugs Cross Reactors Rash    All over the body   Family History  Problem Relation Age of Onset  . Heart disease Mother        AMI as cause of death age 96.  Marland Kitchen Heart disease Father         AMI as cause of death.  . Heart disease Brother     PE: BP 120/70   Pulse 69   Ht '5\' 4"'  (1.626 m) Comment: measured  Wt 166 lb (75.3 kg)   SpO2 98%   BMI 28.49 kg/m  Body mass index is 28.49 kg/m. Wt Readings from Last 3 Encounters:  07/29/18 166 lb (75.3 kg)  05/13/18 169 lb 9.6 oz (76.9 kg)  01/24/18 171 lb 12.8 oz (77.9 kg)   Constitutional: overweight, in NAD Eyes: PERRLA, EOMI, no exophthalmos ENT: moist mucous membranes, no thyromegaly, no cervical lymphadenopathy Cardiovascular: RRR, No MRG Respiratory: CTA B Gastrointestinal: abdomen soft, NT, ND, BS+ Musculoskeletal: no deformities, strength intact in all 4 Skin: moist, warm, no rashes Neurological: no tremor with outstretched hands, DTR normal in all 4  ASSESSMENT: 1. 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 HL  4. Insomnia  PLAN:  1. Patient with well-controlled diabetes, on oral antidiabetic regimen, with metformin, sulfonylurea, and SGLT 2 inhibitor.  At last visit we discussed about checking sugars later in the day, also, as she was taking only in the morning. - latest HbA1c  - better: 6.6% - continues to check sugars only in am >> at goal mostly, occasionally higher, up to 160 - will continue current regimen for now - discussed checking sugars later in the day also,  when she has time - I advised her to: Patient Instructions  Please continue: - Invokana 100 mg in am - Metformin ER 500 mg 3x a day - Glipizide XL 5 mg before b'fast  Please come back for a follow-up appointment in 6 months with your sugar log.  - today, HbA1c is 6.8% (slightly higher) - advised for yearly eye exams >> she is UTD - Return to clinic in 6 mo with sugar log     2. PN - stable - continues on: - alpha-lipoic acid 600 mg bid - fothiamine  3. HL - Reviewed latest lipid panel from 05/2018: TG high, HDL  low, LDL at goal Lab Results  Component Value Date   CHOL 168 05/16/2018   HDL 34 (L) 05/16/2018   LDLCALC 62 05/16/2018   TRIG 360 (H) 05/16/2018   CHOLHDL 4.9 (H) 05/16/2018  - not on a statin; on omega 3 FAs 1000 mg 2x a day  4. Insomnia - has difficulty falling asleep - tried Melatonin - discussed 5HT, Sleepy time teas (but careful with balance I going to the restroom at night), reading before going to sleep  [name], MD PhD [facility] Endocrinology

## 2018-08-02 ENCOUNTER — Encounter: Payer: Self-pay | Admitting: Family Medicine

## 2018-08-05 ENCOUNTER — Telehealth: Payer: Self-pay | Admitting: Family Medicine

## 2018-08-05 NOTE — Telephone Encounter (Signed)
Copied from [place] [phone]. Topic: General - Other >> Aug 05, 2018  3:58 PM Leward Quan A wrote: Reason for CRM:  Patient called to request results of Bone Density test. Ph# [phone]

## 2018-08-05 NOTE — Telephone Encounter (Signed)
Please advise 

## 2018-08-07 NOTE — Telephone Encounter (Signed)
Please let patient know that her bone density scores are stable. She is to continue with her current regime.  Thank you

## 2018-08-09 NOTE — Telephone Encounter (Signed)
Called and informed pt of results, she verbalized understanding.

## 2018-09-09 ENCOUNTER — Other Ambulatory Visit: Payer: Self-pay | Admitting: Internal Medicine

## 2018-09-09 ENCOUNTER — Other Ambulatory Visit: Payer: Self-pay | Admitting: Family Medicine

## 2018-09-10 NOTE — Telephone Encounter (Signed)
Requested Prescriptions  Pending Prescriptions Disp Refills  . TOPROL XL 100 MG 24 hr tablet [Pharmacy Med Name: TOPROL XL 100 MG TABLET] 180 tablet 0    Sig: TAKE 1 TABLET TWICE DAILY WITH FOOD.     Cardiovascular:  Beta Blockers Passed - 09/09/2018 12:15 PM      Passed - Last BP in normal range    BP Readings from Last 1 Encounters:  07/29/18 120/70         Passed - Last Heart Rate in normal range    Pulse Readings from Last 1 Encounters:  07/29/18 69         Passed - Valid encounter within last 6 months    Recent Outpatient Visits          3 months ago Controlled type 2 diabetes mellitus with diabetic nephropathy, without long-term current use of insulin ([place])   [facility] at [facility], Lilia Argue, MD   4 months ago Controlled type 2 diabetes mellitus with diabetic nephropathy, without long-term current use of insulin [facility])   [facility] at [facility], Lilia Argue, MD   10 months ago Routine physical examination   Primary Care at [facility], Renette Butters, MD   1 year ago Controlled type 2 diabetes mellitus with diabetic nephropathy, without long-term current use of insulin [facility])   Primary Care at [facility], Renette Butters, MD   1 year ago Essential hypertension, benign   Primary Care at [facility], Renette Butters, MD      Future Appointments            In 2 months Rutherford Guys, MD Primary Care at [place], [facility]         . dicyclomine (BENTYL) 10 MG capsule [Pharmacy Med Name: DICYCLOMINE 10 MG CAPSULE] 450 capsule 0    Sig: TAKE 1 CAPSULE 4 TIMES DAILY (BEFORE MEALS AND AT BEDTIME).     Gastroenterology:  Antispasmodic Agents Passed - 09/09/2018 12:15 PM      Passed - Last Heart Rate in normal range    Pulse Readings from Last 1 Encounters:  07/29/18 69         Passed - Valid encounter within last 12 months    Recent Outpatient Visits          3 months ago Controlled type 2 diabetes mellitus with diabetic nephropathy, without long-term current use of insulin ([place])   [facility] at [facility], Lilia Argue, MD   4 months ago Controlled type 2 diabetes mellitus with diabetic nephropathy, without long-term current use of insulin [facility])   [facility] at [facility], Lilia Argue, MD   10 months ago Routine physical examination   Primary Care at [facility], Renette Butters, MD   1 year ago Controlled type 2 diabetes mellitus with diabetic nephropathy, without long-term current use of insulin [facility]   Primary Care at [facility], Renette Butters, MD   1 year ago Essential hypertension, benign   Primary Care at [facility], Renette Butters, MD      Future Appointments            In 2 months Rutherford Guys, MD Primary Care at [place], [facility]         . furosemide (LASIX) 20 MG tablet [Pharmacy Med Name: FUROSEMIDE 20 MG TABLET] 180 tablet 0    Sig: TAKE 1 OR 2 TABLETS DAILY AS DIRECTED.     Cardiovascular:  Diuretics - Loop Failed - 09/09/2018 12:15 PM  Failed - Cr in normal range and within 360 days    Creat  Date Value Ref Range Status  11/12/2015 1.25 (H) 0.60 - 0.93 mg/dL Final   Creatinine, Ser  Date Value Ref Range Status  05/16/2018 1.33 (H) 0.57 - 1.00 mg/dL Final         Passed - K in normal range and within 360 days    Potassium  Date Value Ref Range Status  05/16/2018 4.5 3.5 - 5.2 mmol/L Final         Passed - Ca in normal range and within 360 days    Calcium  Date Value Ref Range Status  05/16/2018 9.4 8.7 - 10.3 mg/dL Final         Passed - Na in normal range and within 360 days    Sodium  Date Value Ref Range Status  05/16/2018 141 134 - 144 mmol/L Final         Passed - Last BP in normal range    BP Readings from Last 1 Encounters:  07/29/18 120/70         Passed - Valid encounter within last 6 months    Recent Outpatient Visits          3 months ago Controlled type 2 diabetes mellitus with diabetic nephropathy, without long-term current use of insulin ([place])   [facility] at [facility], Lilia Argue, MD   4 months ago Controlled  type 2 diabetes mellitus with diabetic nephropathy, without long-term current use of insulin [facility])   [facility] at [facility], Lilia Argue, MD   10 months ago Routine physical examination   Primary Care at [facility], Renette Butters, MD   1 year ago Controlled type 2 diabetes mellitus with diabetic nephropathy, without long-term current use of insulin [facility])   Primary Care at [facility], Renette Butters, MD   1 year ago Essential hypertension, benign   Primary Care at [facility], Renette Butters, MD      Future Appointments            In 2 months Rutherford Guys, MD Primary Care at [facility], [facility]         . oxybutynin (DITROPAN-XL) 10 MG 24 hr tablet [Pharmacy Med Name: OXYBUTYNIN CL ER 10 MG TABLET] 90 tablet 0    Sig: TAKE ONE TABLET AT BEDTIME.     Urology:  Bladder Agents Passed - 09/09/2018 12:15 PM      Passed - Valid encounter within last 12 months    Recent Outpatient Visits          3 months ago Controlled type 2 diabetes mellitus with diabetic nephropathy, without long-term current use of insulin ([place])   [facility] at [facility], Lilia Argue, MD   4 months ago Controlled type 2 diabetes mellitus with diabetic nephropathy, without long-term current use of insulin [facility])   [facility] at [facility], Lilia Argue, MD   10 months ago Routine physical examination   Primary Care at [facility], Renette Butters, MD   1 year ago Controlled type 2 diabetes mellitus with diabetic nephropathy, without long-term current use of insulin [facility])   Primary Care at [facility], Renette Butters, MD   1 year ago Essential hypertension, benign   Primary Care at [facility], Renette Butters, MD      Future Appointments            In 2 months Rutherford Guys, MD Primary Care at [place],  [facility]         . INVOKANA 100 MG TABS tablet [Pharmacy Med Name: INVOKANA 100 MG TABLET] 90 tablet 0    Sig: TAKE 1 TABLET ONCE DAILY.     Endocrinology: Diabetes - SGLT2 Inhibitors - canagliflozin Failed - 09/09/2018 12:15 PM      Failed  - Cr in normal range and within 360 days    Creat  Date Value Ref Range Status  11/12/2015 1.25 (H) 0.60 - 0.93 mg/dL Final   Creatinine, Ser  Date Value Ref Range Status  05/16/2018 1.33 (H) 0.57 - 1.00 mg/dL Final         Failed - eGFR in normal range and within 360 days    GFR, Est African American  Date Value Ref Range Status  07/15/2014 75 mL/min Final   GFR calc Af Amer  Date Value Ref Range Status  05/16/2018 45 (L) >59 mL/min/1.73 Final   GFR, Est Non African American  Date Value Ref Range Status  07/15/2014 65 mL/min Final    Comment:      The estimated GFR is a calculation valid for adults (>=14 years old) that uses the CKD-EPI algorithm to adjust for age and sex. It is   not to be used for children, pregnant women, hospitalized patients,    patients on dialysis, or with rapidly changing kidney function. According to the NKDEP, eGFR >89 is normal, 60-89 shows mild impairment, 30-59 shows moderate impairment, 15-29 shows severe impairment and <15 is ESRD.      GFR calc non Af Amer  Date Value Ref Range Status  05/16/2018 39 (L) >59 mL/min/1.73 Final   GFR  Date Value Ref Range Status  05/21/2014 41.58 (L) >60.00 mL/min Final         Passed - HBA1C is between 0 and 7.9 and within 180 days    Hemoglobin A1C  Date Value Ref Range Status  07/29/2018 6.8 (A) 4.0 - 5.6 % Final   Hgb A1c MFr Bld  Date Value Ref Range Status  11/12/2017 6.9 (H) 4.8 - 5.6 % Final    Comment:             Prediabetes: 5.7 - 6.4          Diabetes: >6.4          Glycemic control for adults with diabetes: <7.0          Passed - LDL in normal range and within 360 days    LDL Calculated  Date Value Ref Range Status  05/16/2018 62 0 - 99 mg/dL Final         Passed - Valid encounter within last 6 months    Recent Outpatient Visits          3 months ago Controlled type 2 diabetes mellitus with diabetic nephropathy, without long-term current use of insulin ([place])   Primary Care  at [facility], Lilia Argue, MD   4 months ago Controlled type 2 diabetes mellitus with diabetic nephropathy, without long-term current use of insulin [facility])   [facility] at [facility], Lilia Argue, MD   10 months ago Routine physical examination   Primary Care at [facility], Renette Butters, MD   1 year ago Controlled type 2 diabetes mellitus with diabetic nephropathy, without long-term current use of insulin [facility])   Primary Care at [facility], Renette Butters, MD   1 year ago Essential hypertension, benign   Primary Care at [facility], Renette Butters, MD  Future Appointments            In 2 months Pamella Pert, Lilia Argue, MD Primary Care at [place], [facility]

## 2018-09-16 ENCOUNTER — Other Ambulatory Visit: Payer: Self-pay | Admitting: Family Medicine

## 2018-09-16 NOTE — Telephone Encounter (Signed)
Copied from [place] [id number]. Topic: Quick Communication - Rx Refill/Question >> Sep 16, 2018 12:27 PM Gustavus Messing wrote: Medication: oxybutynin (DITROPAN-XL) 10 MG 24 hr tablet   Has the patient contacted their pharmacy? No. (Agent: If no, request that the patient contact the pharmacy for the refill.) Patient is stating that the 10 MG is not working and symptoms are getting worse. Would like to be switched to 15MG  as soon as possible.    Preferred Pharmacy (with phone number or street name): [facility], [place]. [phone] (Phone) [phone] (Fax)    Agent: Please be advised that RX refills may take up to 3 business days. We ask that you follow-up with your pharmacy.

## 2018-09-16 NOTE — Telephone Encounter (Signed)
Requested medication (s) are due for refill today:   No  Pt is requesting to be increased to 15 mg from the 10 mg she is on now because the 10 mg is not working.  Pt states her symptoms are getting worse.  Requested medication (s) are on the active medication list:   Yes  Future visit scheduled:   Yes 11/15/2018 with Dr. Pamella Pert   Last ordered: 09/10/2018  #90  No Refills   Requested Prescriptions  Pending Prescriptions Disp Refills   oxybutynin (DITROPAN-XL) 10 MG 24 hr tablet 90 tablet 0    Sig: Take 1 tablet (10 mg total) by mouth at bedtime.     Urology:  Bladder Agents Passed - 09/16/2018  1:18 PM      Passed - Valid encounter within last 12 months    Recent Outpatient Visits          4 months ago Controlled type 2 diabetes mellitus with diabetic nephropathy, without long-term current use of insulin ([place])   [facility] at [facility], Lilia Argue, MD   4 months ago Controlled type 2 diabetes mellitus with diabetic nephropathy, without long-term current use of insulin [facility])   [facility] at [facility], Lilia Argue, MD   10 months ago Routine physical examination   Primary Care at [facility], Renette Butters, MD   1 year ago Controlled type 2 diabetes mellitus with diabetic nephropathy, without long-term current use of insulin [facility])   Primary Care at [facility], Renette Butters, MD   1 year ago Essential hypertension, benign   Primary Care at [facility], Renette Butters, MD      Future Appointments            In 2 months Rutherford Guys, MD Primary Care at [place], [facility]

## 2018-10-07 ENCOUNTER — Telehealth: Payer: Self-pay | Admitting: Family Medicine

## 2018-10-07 NOTE — Telephone Encounter (Signed)
Please reach out to patient. I called pharamcy and confirmed that she has always been on oxybutynin XL 10mg . She has never been on 15mg . I do not want to increase, unless symptom control was significantly worse, as she would higher risk of side effects. thanks

## 2018-10-07 NOTE — Telephone Encounter (Signed)
Copied from [place] [id number]. Topic: General - Other >> Oct 07, 2018 12:32 PM Percell Belt A wrote: Reason for CRM:   [facility] called in left message on [employer] - pt states the dose on the oxybutynin (DITROPAN-XL) 10 MG 24 hr tablet [[id number]] is incorrect.  They are requesting return call to verify the dose    Best number [phone]

## 2018-10-08 MED ORDER — OXYBUTYNIN CHLORIDE ER 15 MG PO TB24: 15.0000 mg | ORAL_TABLET | Freq: Every day | ORAL | 1 refills | Status: DC

## 2018-10-08 NOTE — Telephone Encounter (Signed)
Rx has been sent to pharmacy

## 2018-10-08 NOTE — Telephone Encounter (Signed)
Called and spoke to pt this morning and she states he feels the issues has been worst since her last OV and would like a higher dose.

## 2018-11-15 ENCOUNTER — Ambulatory Visit: Payer: Medicare Other | Admitting: Family Medicine

## 2018-12-09 ENCOUNTER — Other Ambulatory Visit: Payer: Self-pay | Admitting: Family Medicine

## 2018-12-09 ENCOUNTER — Other Ambulatory Visit: Payer: Self-pay | Admitting: Internal Medicine

## 2018-12-09 NOTE — Telephone Encounter (Signed)
Routing to PCP as last A1c 07/29/18 6.8.

## 2018-12-11 NOTE — Telephone Encounter (Signed)
Please schedule patient. Last OV nov 2019. thanks

## 2018-12-12 ENCOUNTER — Other Ambulatory Visit: Payer: Self-pay | Admitting: Family Medicine

## 2018-12-13 ENCOUNTER — Other Ambulatory Visit: Payer: Self-pay | Admitting: Internal Medicine

## 2018-12-16 ENCOUNTER — Other Ambulatory Visit: Payer: Self-pay | Admitting: Family Medicine

## 2018-12-16 NOTE — Telephone Encounter (Signed)
Requested medication (s) are due for refill today - yes- if to continue  Requested medication (s) are on the active medication list -no not listed  Future visit scheduled -no  Last refill: 08/08/18  Notes to clinic: Patient is requesting refill of non delegated Rx- not on medication list. Sent for PCP review of request.  Requested Prescriptions  Pending Prescriptions Disp Refills   zaleplon (SONATA) 5 MG capsule [Pharmacy Med Name: ZALEPLON 5 MG CAPSULE] 40 capsule 0    Sig: TAKE 1 CAPSULE AT BEDTIME AS NEEDED FOR SLEEP.     Not Delegated - Psychiatry:  Anxiolytics/Hypnotics Failed - 12/16/2018 11:16 AM      Failed - This refill cannot be delegated      Failed - Urine Drug Screen completed in last 360 days.      Failed - Valid encounter within last 6 months    Recent Outpatient Visits          7 months ago Controlled type 2 diabetes mellitus with diabetic nephropathy, without long-term current use of insulin ([place])   [facility] at [facility], Lilia Argue, MD   7 months ago Controlled type 2 diabetes mellitus with diabetic nephropathy, without long-term current use of insulin [facility])   [facility] at [facility], Lilia Argue, MD   1 year ago Routine physical examination   Primary Care at [facility], Renette Butters, MD   1 year ago Controlled type 2 diabetes mellitus with diabetic nephropathy, without long-term current use of insulin [facility])   Primary Care at [facility], Renette Butters, MD   2 years ago Essential hypertension, benign   Primary Care at [facility], Renette Butters, MD      Future Appointments            In 2 days Rutherford Guys, MD Primary Care at [place], [facility]            Requested Prescriptions  Pending Prescriptions Disp Refills   zaleplon (SONATA) 5 MG capsule [Pharmacy Med Name: ZALEPLON 5 MG CAPSULE] 40 capsule 0    Sig: TAKE 1 CAPSULE AT BEDTIME AS NEEDED FOR SLEEP.     Not Delegated - Psychiatry:  Anxiolytics/Hypnotics Failed - 12/16/2018 11:16 AM      Failed -  This refill cannot be delegated      Failed - Urine Drug Screen completed in last 360 days.      Failed - Valid encounter within last 6 months    Recent Outpatient Visits          7 months ago Controlled type 2 diabetes mellitus with diabetic nephropathy, without long-term current use of insulin ([place])   [facility] at [facility], Lilia Argue, MD   7 months ago Controlled type 2 diabetes mellitus with diabetic nephropathy, without long-term current use of insulin [facility])   Primary Care at [facility], Lilia Argue, MD   1 year ago Routine physical examination   Primary Care at [facility], Renette Butters, MD   1 year ago Controlled type 2 diabetes mellitus with diabetic nephropathy, without long-term current use of insulin [facility])   Primary Care at [facility], Renette Butters, MD   2 years ago Essential hypertension, benign   Primary Care at [facility], Renette Butters, MD      Future Appointments            In 2 days Rutherford Guys, MD Primary Care at [place], [facility]

## 2018-12-18 ENCOUNTER — Ambulatory Visit: Payer: Medicare Other | Admitting: Family Medicine

## 2018-12-18 ENCOUNTER — Telehealth: Payer: Self-pay | Admitting: *Deleted

## 2018-12-18 NOTE — Telephone Encounter (Signed)
Schedule AWV.  

## 2018-12-19 ENCOUNTER — Ambulatory Visit (INDEPENDENT_AMBULATORY_CARE_PROVIDER_SITE_OTHER): Payer: Medicare Other | Admitting: Family Medicine

## 2018-12-19 ENCOUNTER — Other Ambulatory Visit: Payer: Self-pay

## 2018-12-19 ENCOUNTER — Other Ambulatory Visit: Payer: Self-pay | Admitting: Family Medicine

## 2018-12-19 DIAGNOSIS — E1121 Type 2 diabetes mellitus with diabetic nephropathy: Secondary | ICD-10-CM

## 2018-12-19 DIAGNOSIS — I10 Essential (primary) hypertension: Secondary | ICD-10-CM

## 2018-12-19 LAB — LIPID PANEL

## 2018-12-19 NOTE — Addendum Note (Signed)
Addended by: Ileana Roup on: 12/19/2018 09:57 AM   Modules accepted: Orders

## 2018-12-20 LAB — COMPREHENSIVE METABOLIC PANEL
ALT: 10 IU/L (ref 0–32)
AST: 12 IU/L (ref 0–40)
Albumin/Globulin Ratio: 2 (ref 1.2–2.2)
Albumin: 4.5 g/dL (ref 3.7–4.7)
Alkaline Phosphatase: 78 IU/L (ref 39–117)
BUN/Creatinine Ratio: 17 (ref 12–28)
BUN: 21 mg/dL (ref 8–27)
Bilirubin Total: 0.4 mg/dL (ref 0.0–1.2)
CO2: 21 mmol/L (ref 20–29)
Calcium: 10.4 mg/dL — ABNORMAL HIGH (ref 8.7–10.3)
Chloride: 103 mmol/L (ref 96–106)
Creatinine, Ser: 1.21 mg/dL — ABNORMAL HIGH (ref 0.57–1.00)
GFR calc Af Amer: 50 mL/min/{1.73_m2} — ABNORMAL LOW (ref >59)
GFR calc non Af Amer: 43 mL/min/{1.73_m2} — ABNORMAL LOW (ref >59)
Globulin, Total: 2.2 g/dL (ref 1.5–4.5)
Glucose: 142 mg/dL — ABNORMAL HIGH (ref 65–99)
Potassium: 4.4 mmol/L (ref 3.5–5.2)
Sodium: 144 mmol/L (ref 134–144)
Total Protein: 6.7 g/dL (ref 6.0–8.5)

## 2018-12-20 LAB — LIPID PANEL
Chol/HDL Ratio: 4.4 ratio (ref 0.0–4.4)
Cholesterol, Total: 158 mg/dL (ref 100–199)
HDL: 36 mg/dL — ABNORMAL LOW (ref >39)
LDL Calculated: 68 mg/dL (ref 0–99)
Triglycerides: 272 mg/dL — ABNORMAL HIGH (ref 0–149)
VLDL Cholesterol Cal: 54 mg/dL — ABNORMAL HIGH (ref 5–40)

## 2018-12-20 LAB — HEMOGLOBIN A1C
Est. average glucose Bld gHb Est-mCnc: 151 mg/dL
Hgb A1c MFr Bld: 6.9 % — ABNORMAL HIGH (ref 4.8–5.6)

## 2018-12-20 LAB — TSH: TSH: 3.66 u[IU]/mL (ref 0.450–4.500)

## 2018-12-23 ENCOUNTER — Ambulatory Visit: Payer: Medicare Other | Admitting: Family Medicine

## 2018-12-23 ENCOUNTER — Other Ambulatory Visit: Payer: Self-pay

## 2018-12-23 ENCOUNTER — Encounter: Payer: Self-pay | Admitting: Family Medicine

## 2018-12-23 VITALS — BP 123/73 | HR 83 | Temp 98.6°F | Ht 64.0 in | Wt 167.0 lb

## 2018-12-23 DIAGNOSIS — N3941 Urge incontinence: Secondary | ICD-10-CM | POA: Diagnosis not present

## 2018-12-23 DIAGNOSIS — K580 Irritable bowel syndrome with diarrhea: Secondary | ICD-10-CM | POA: Diagnosis not present

## 2018-12-23 DIAGNOSIS — I10 Essential (primary) hypertension: Secondary | ICD-10-CM

## 2018-12-23 DIAGNOSIS — G4700 Insomnia, unspecified: Secondary | ICD-10-CM

## 2018-12-23 DIAGNOSIS — J301 Allergic rhinitis due to pollen: Secondary | ICD-10-CM

## 2018-12-23 DIAGNOSIS — E1121 Type 2 diabetes mellitus with diabetic nephropathy: Secondary | ICD-10-CM

## 2018-12-23 MED ORDER — MONTELUKAST SODIUM 10 MG PO TABS: 10.0000 mg | ORAL_TABLET | Freq: Every day | ORAL | 1 refills | Status: DC

## 2018-12-23 MED ORDER — TOPROL XL 100 MG PO TB24: 100.0000 mg | ORAL_TABLET | Freq: Two times a day (BID) | ORAL | 3 refills | Status: DC

## 2018-12-23 MED ORDER — ZALEPLON 5 MG PO CAPS: 5.0000 mg | ORAL_CAPSULE | Freq: Every evening | ORAL | 2 refills | Status: DC | PRN

## 2018-12-23 MED ORDER — FLUTICASONE PROPIONATE 50 MCG/ACT NA SUSP: 2.0000 | Freq: Every day | NASAL | 3 refills | Status: DC

## 2018-12-23 MED ORDER — FUROSEMIDE 20 MG PO TABS: 20.0000 mg | ORAL_TABLET | Freq: Every day | ORAL | 1 refills | Status: DC

## 2018-12-23 MED ORDER — OXYBUTYNIN CHLORIDE ER 15 MG PO TB24: 15.0000 mg | ORAL_TABLET | Freq: Every day | ORAL | 1 refills | Status: DC

## 2018-12-23 NOTE — Progress Notes (Signed)
6/15/202012:18 PM  [name] [name] 1941-02-26, 78 y.o., female [id number]  Chief Complaint  Patient presents with  . Diabetes    making an appt for eye exam  . Hypertension    HPI:   Patient is a 78 y.o. female with past medical history significant for DM2, HTN, IBS, GERD, insmonia who presents today for routine followup  Last OV Nov 2019 Sees Dr Letta Median for DM, last OV Jan 2020 Sees Dr Katy Fitch for eyes, sees him this week, thinking of rescheduling  She reports she has been doing well  HTN: Takes toprolol and lasix Does not check BP at home  Insomnia: Requesting refill of sonata, takes prn, denies any side effects pmp reviewed Has tried ambien, melatonin, belsomra, trazodone  Urge incontience: Doing well on ditropan XL, denies any side effects  IBS- Diarrhea: Stable Takes rifampin prn Takes bentyl every night  Lab Results  Component Value Date   CHOL 158 12/19/2018   HDL 36 (L) 12/19/2018   LDLCALC 68 12/19/2018   TRIG 272 (H) 12/19/2018   CHOLHDL 4.4 12/19/2018   Lab Results  Component Value Date   CREATININE 1.21 (H) 12/19/2018   BUN 21 12/19/2018   NA 144 12/19/2018   K 4.4 12/19/2018   CL 103 12/19/2018   CO2 21 12/19/2018  GFR 43   Lab Results  Component Value Date   HGBA1C 6.9 (H) 12/19/2018    Fall Risk  12/23/2018 11/12/2017 05/11/2017 05/07/2017 11/01/2016  Falls in the past year? 0 No Yes Yes Yes  Number falls in past yr: 0 - '1 1 1  ' Injury with Fall? 0 - No - -  Risk for fall due to : - - Impaired balance/gait - -  Risk for fall due to: Comment - - Patient has balance issues - -  Follow up - - Falls prevention discussed - -     Depression screen [facility] 2/9 12/23/2018 11/12/2017 05/11/2017  Decreased Interest 0 0 0  Down, Depressed, Hopeless 0 0 0  PHQ - 2 Score 0 0 0    Allergies  Allergen Reactions  . Fruit & Vegetable Daily [Nutritional Supplements] Diarrhea  . Onion Other (See Comments)    indigestion  . Tree Extract Swelling  .  Sulfa Drugs Cross Reactors Rash    All over the body    Prior to Admission medications   Medication Sig Start Date End Date Taking? Authorizing Provider  aspirin 81 MG tablet Take 81 mg by mouth daily.   Yes [provider]  Blood Glucose Monitoring Suppl (ONE TOUCH ULTRA 2) W/DEVICE KIT Inject 1 application as directed as needed. 08/13/14  Yes [provider]  dicyclomine (BENTYL) 10 MG capsule TAKE 1 CAPSULE 4 TIMES DAILY (BEFORE MEALS AND AT BEDTIME). 09/10/18  Yes Rutherford Guys, MD  fluticasone [facility]) 50 MCG/ACT nasal spray Place 2 sprays into both nostrils daily. 11/12/17  Yes Wardell Honour, MD  furosemide (LASIX) 20 MG tablet TAKE 1 OR 2 TABLETS DAILY AS DIRECTED. Needs Office Visit for further refills. 12/13/18  Yes Rutherford Guys, MD  glipiZIDE (GLUCOTROL XL) 5 MG 24 hr tablet TAKE 1 TABLET EVERY DAY WITH BREAKFAST. 12/09/18  Yes [name], MD  INVOKANA 100 MG TABS tablet TAKE 1 TABLET ONCE DAILY. 12/13/18  Yes [name], MD  Lancets (FREESTYLE) lancets CHECK BLOOD SUGAR TWICE DAILY. 01/01/18  Yes [name], MD  lidocaine (LIDODERM) 5 % Place 1 patch onto the skin daily. Remove & Discard  patch within 12 hours or as directed by MD 11/27/17  Yes Wardell Honour, MD  metFORMIN (GLUCOPHAGE-XR) 500 MG 24 hr tablet TAKE 1 TABLET 3 TIMES DAILY WITH MEALS. 12/09/18  Yes [name], MD  Misc Natural Products (TART CHERRY ADVANCED PO) Take 1 capsule by mouth daily.   Yes [provider]  montelukast (SINGULAIR) 10 MG tablet TAKE ONE TABLET AT BEDTIME. 12/12/18  Yes Rutherford Guys, MD  mupirocin ointment (BACTROBAN) 2 % Apply 1 application topically 3 (three) times daily. 05/07/17  Yes Wardell Honour, MD  [facility] VERIO test strip USE 1 TEST BLOOD SUGAR TWICE DAILY. 01/01/18  Yes [name], MD  oxybutynin (DITROPAN XL) 15 MG 24 hr tablet Take 1 tablet (15 mg total) by mouth at bedtime. 10/08/18  Yes Rutherford Guys, MD  rifaximin (XIFAXAN)  550 MG TABS tablet TAKE 1 TABLET TWICE DAILY. PRN 03/25/18  Yes Stallings, Zoe A, MD  Suvorexant (BELSOMRA) 10 MG TABS Take 10 mg by mouth at bedtime. 06/20/18  Yes Rutherford Guys, MD  TOPROL XL 100 MG 24 hr tablet Take 1 tablet (100 mg total) by mouth 2 (two) times daily with a meal. Needs Office Visit for further refills. 12/13/18  Yes Rutherford Guys, MD    Past Medical History:  Diagnosis Date  . Allergy    [name]; allergy shots weekly.    . Anxiety   . Arthritis    DDD lumbar spine.    . Cancer ([place])    breast L x 2; skin basal cell carcinoma [name]).  . 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 Diabetes mellitus   . Hypertension   . IBS (irritable bowel syndrome)    diarrhea predominant.    Past Surgical History:  Procedure Laterality Date  . BREAST LUMPECTOMY  1996   L breast cancer  . MASTECTOMY  2002   Bilateral for breast cancer  . VAGINAL HYSTERECTOMY  1985   DUB; uterine fibroids; ovaries intact.    Social History   Tobacco Use  . Smoking status: Never Smoker  . Smokeless tobacco: Never Used  Substance Use Topics  . Alcohol use: No    Alcohol/week: 0.0 standard drinks    Comment: rare    Family History  Problem Relation Age of Onset  . Heart disease Mother        AMI as cause of death age 19.  Marland Kitchen Heart disease Father        AMI as cause of death.  . Heart disease Brother     Review of Systems  Constitutional: Negative for chills and fever.  Respiratory: Negative for cough and shortness of breath.   Cardiovascular: Negative for chest pain, palpitations and leg swelling.  Gastrointestinal: Positive for abdominal pain and diarrhea. Negative for nausea and vomiting.  Endo/Heme/Allergies: Negative for polydipsia.  per hpi   OBJECTIVE:  Today's Vitals   12/23/18 1159  BP: 123/73  Pulse: 83  Temp: 98.6 F (37 C)  TempSrc: Oral  SpO2: 96%  Weight: 167 lb (75.8 kg)  Height: '5\' 4"'  (1.626 m)   Body mass  index is 28.67 kg/m.   Wt Readings from Last 3 Encounters:  12/23/18 167 lb (75.8 kg)  07/29/18 166 lb (75.3 kg)  05/13/18 169 lb 9.6 oz (76.9 kg)    Physical Exam Vitals signs and nursing note reviewed.  Constitutional:      Appearance: She is well-developed.  HENT:     Head: Normocephalic and atraumatic.     Mouth/Throat:     Pharynx: No oropharyngeal exudate.  Eyes:     General: No scleral icterus.    Conjunctiva/sclera: Conjunctivae normal.     Pupils: Pupils are equal, round, and reactive to light.  Neck:     Musculoskeletal: Neck supple.  Cardiovascular:     Rate and Rhythm: Normal rate and regular rhythm.     Heart sounds: Normal heart sounds. No murmur. No friction rub. No gallop.   Pulmonary:     Effort: Pulmonary effort is normal.     Breath sounds: Normal breath sounds. No wheezing or rales.  Skin:    General: Skin is warm and dry.  Neurological:     Mental Status: She is alert and oriented to person, place, and time.     ASSESSMENT and PLAN  1. Essential hypertension Controlled. Continue current regime.   2. Chronic seasonal allergic rhinitis due to pollen - fluticasone (FLONASE) 50 MCG/ACT nasal spray; Place 2 sprays into both nostrils daily.  3. Urge incontinence Stable. Continue regime  4. Irritable bowel syndrome with diarrhea Stable. Continue regime  5. Controlled type 2 diabetes mellitus with diabetic nephropathy, without long-term current use of insulin ([place]) Managed by endo. Last a1c at goal. CKD stable  6. Insomnia, unspecified type Stable. Med refilled  Other orders - zaleplon (SONATA) 5 MG capsule; Take 1 capsule (5 mg total) by mouth at bedtime as needed for sleep. - TOPROL XL 100 MG 24 hr tablet; Take 1 tablet (100 mg total) by mouth 2 (two) times daily with a meal. - oxybutynin (DITROPAN XL) 15 MG 24 hr tablet; Take 1 tablet (15 mg total) by mouth at bedtime. - montelukast (SINGULAIR) 10 MG tablet; Take 1 tablet (10 mg total) by  mouth at bedtime. - furosemide (LASIX) 20 MG tablet; Take 1 tablet (20 mg total) by mouth daily. TAKE 1 OR 2 TABLETS DAILY AS DIRECTED. Needs Office Visit for further refills.  Return in about 6 months (around 06/24/2019).    Rutherford Guys, MD Primary [facility] at [facility] [place], [place] [address] Ph.  [phone] Fax [phone]

## 2019-01-13 ENCOUNTER — Other Ambulatory Visit: Payer: Self-pay | Admitting: Family Medicine

## 2019-01-13 NOTE — Telephone Encounter (Signed)
Medication is not assigned to a protocol. / Routing to office for review

## 2019-01-17 ENCOUNTER — Telehealth: Payer: Self-pay | Admitting: Family Medicine

## 2019-01-17 NOTE — Telephone Encounter (Signed)
Pharmacy following up on this request.  Not prescribed since 03/2018.  Advised pharmacy have to pt call to clarify.

## 2019-01-17 NOTE — Telephone Encounter (Signed)
Patient is calling to report that she take rifaximin (XIFAXAN) 550 MG TABS tablet [[id number]]   For IB. Patient is requesting a refill.PLease advise .

## 2019-01-20 MED ORDER — RIFAXIMIN 550 MG PO TABS: ORAL_TABLET | ORAL | 3 refills | Status: DC

## 2019-01-23 ENCOUNTER — Other Ambulatory Visit: Payer: Self-pay

## 2019-01-27 ENCOUNTER — Encounter: Payer: Self-pay | Admitting: Internal Medicine

## 2019-01-27 ENCOUNTER — Other Ambulatory Visit: Payer: Self-pay

## 2019-01-27 ENCOUNTER — Ambulatory Visit (INDEPENDENT_AMBULATORY_CARE_PROVIDER_SITE_OTHER): Payer: Medicare Other | Admitting: Internal Medicine

## 2019-01-27 DIAGNOSIS — G629 Polyneuropathy, unspecified: Secondary | ICD-10-CM

## 2019-01-27 DIAGNOSIS — E1121 Type 2 diabetes mellitus with diabetic nephropathy: Secondary | ICD-10-CM

## 2019-01-27 DIAGNOSIS — E785 Hyperlipidemia, unspecified: Secondary | ICD-10-CM

## 2019-01-27 NOTE — Progress Notes (Signed)
Patient ID: [name] [name], female   DOB: [birth year], 78 y.o.   MRN: [id number]  Patient location: Home My location: Office  Referring Provider: Rutherford Guys, MD  I connected with the patient on 01/27/19 at  2:03 PM EDT by telephone and verified that I am speaking with the correct person.   I discussed the limitations of evaluation and management by telephone and the availability of in person appointments. The patient expressed understanding and agreed to proceed.   Details of the encounter are shown below.  HPI: [name] [name] is a 78 y.o.-year-old female, returning for follow-up for DM2, dx 2014, non-insulin-dependent, now controlled, with complications (mild CKD, PN). Last visit 6 months ago.  Last hemoglobin A1c was: Lab Results  Component Value Date   HGBA1C 6.9 (H) 12/19/2018   HGBA1C 6.8 (A) 07/29/2018   HGBA1C 6.6 (A) 01/24/2018   Pt is on a regimen of: - Invokana 100 mg in am - Metformin ER 500 mg 3x a day - Glipizide XL 5 mg before b'fast  Pt checks her sugars 0 to once a day: - A.m.: 120s,160 >> 123-160 >> 112, 133 x2, 165 (forgot meds) - 2 hours post breakfast: 96, 139 >> n/c - Before lunch:  95-144 >> 126-133 >> n/c  - 2 hours post lunch:  95, 131, 187 >> 83 >> n/c - before dinner:  n/c >> 93, 111 >> 79-153 >> n/c - 2 hours post dinner: 101, 103 >> n/c >> 140 >> n/c - Bedtime:  116-169 >> n/c >> 102 >> n/c Lowest: 79 >> .Marland Kitchen.123 >> 112 Highest: 160 >> 165.  Pt's meals are: - Breakfast: apple + cottage cheese + whole wheat - Lunch: leftovers from dinner; sandwich; salad - Dinner: varies; some fast food - Snacks: veggies; chips + some diet sodas.  Before the coronavirus pandemic, she was exercising consistently: Silver sneakers at the [facility] 3 times a week, tai chi once a week. Now closed.  -+ CKD, last BUN/creatinine:  Lab Results  Component Value Date   BUN 21 12/19/2018   CREATININE 1.21 (H) 12/19/2018  Not on an ACE inhibitor/ARB. -+ HL; last  set of lipids: Lab Results  Component Value Date   CHOL 158 12/19/2018   HDL 36 (L) 12/19/2018   LDLCALC 68 12/19/2018   TRIG 272 (H) 12/19/2018   CHOLHDL 4.4 12/19/2018  Not on a statin but takes omega-3 fatty acids. - last eye exam was in 11/2017: No DR; + history of cataract surgery - + numbness and tingling in her feet.  She is on B complex and came off alpha lipoic acid. She used Lidocaine patches >> discontinued.  We started PN cream , but this was expensive (93$). Voltaren gel works a little better. Tried Neurontin >> did not like it. She read about possible side effects from Lyrica >> would not want to try it.  She had stool incontinence >> better after switching to metformin ER  She is a friend of Dr. Milta Deiters.  ROS: Constitutional: no weight gain/no weight loss, no fatigue, no subjective hyperthermia, no subjective hypothermia, + insomnia Eyes: no blurry vision, no xerophthalmia ENT: no sore throat, no nodules palpated in neck, no dysphagia, no odynophagia, no hoarseness Cardiovascular: no CP/no SOB/no palpitations/no leg swelling Respiratory: no cough/no SOB/no wheezing Gastrointestinal: no N/no V/no D/no C/no acid reflux Musculoskeletal: no muscle aches/no joint aches Skin: no rashes, no hair loss Neurological: no tremors/+ numbness/+ tingling/no dizziness  I reviewed pt's medications, allergies, PMH,  social hx, family hx, and changes were documented in the history of present illness. Otherwise, unchanged from my initial visit note.  Past Medical History:  Diagnosis Date  . Allergy    [name]; allergy shots weekly.    . Anxiety   . Arthritis    DDD lumbar spine.    . Cancer ([place])    breast L x 2; skin basal cell carcinoma [name]).  . 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 Diabetes mellitus   . Hypertension   . IBS (irritable bowel syndrome)    diarrhea predominant.   Past Surgical History:  Procedure  Laterality Date  . BREAST LUMPECTOMY  1996   L breast cancer  . MASTECTOMY  2002   Bilateral for breast cancer  . VAGINAL HYSTERECTOMY  1985   DUB; uterine fibroids; ovaries intact.   Social History   Socioeconomic History  . Marital status: Married    Spouse name: Not on file  . Number of children: Not on file  . Years of education: Not on file  . Highest education level: Not on file  Occupational History  . Occupation: retired  Scientific laboratory technician  . Financial resource strain: Not on file  . Food insecurity    Worry: Not on file    Inability: Not on file  . Transportation needs    Medical: Not on file    Non-medical: Not on file  Tobacco Use  . Smoking status: Never Smoker  . Smokeless tobacco: Never Used  Substance and Sexual Activity  . Alcohol use: No    Alcohol/week: 0.0 standard drinks    Comment: rare  . Drug use: No  . Sexual activity: Never  Lifestyle  . Physical activity    Days per week: Not on file    Minutes per session: Not on file  . Stress: Not on file  Relationships  . Social Herbalist on phone: Not on file    Gets together: Not on file    Attends religious service: Not on file    Active member of club or organization: Not on file    Attends meetings of clubs or organizations: Not on file    Relationship status: Not on file  . Intimate partner violence    Fear of current or ex partner: Not on file    Emotionally abused: Not on file    Physically abused: Not on file    Forced sexual activity: Not on file  Other Topics Concern  . 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.      Tobacco: never      Alcohol: rarely;       Exercise:  Silver Sneakers; Tai Chi once per week; exercises 3 days per week;  balance class      ADLs: independent with ADLs; drives.  Husband does the cleaning and lifting groceries.      Advanced Directives: YES;  FULL CODE no prolonged measures.              Current Outpatient Medications on File Prior to Visit  Medication Sig Dispense Refill  . aspirin 81 MG tablet Take 81  mg by mouth daily.    . Blood Glucose Monitoring Suppl (ONE TOUCH ULTRA 2) W/DEVICE KIT Inject 1 application as directed as needed.    . dicyclomine (BENTYL) 10 MG capsule TAKE 1 CAPSULE 4 TIMES DAILY (BEFORE MEALS AND AT BEDTIME). 450 capsule 0  . fluticasone (FLONASE) 50 MCG/ACT nasal spray Place 2 sprays into both nostrils daily. 48 g 3  . furosemide (LASIX) 20 MG tablet Take 1 tablet (20 mg total) by mouth daily. TAKE 1 OR 2 TABLETS DAILY AS DIRECTED. Needs Office Visit for further refills. 90 tablet 1  . glipiZIDE (GLUCOTROL XL) 5 MG 24 hr tablet TAKE 1 TABLET EVERY DAY WITH BREAKFAST. 90 tablet 0  . INVOKANA 100 MG TABS tablet TAKE 1 TABLET ONCE DAILY. 90 tablet 0  . Lancets (FREESTYLE) lancets CHECK BLOOD SUGAR TWICE DAILY. 100 each 0  . lidocaine (LIDODERM) 5 % Place 1 patch onto the skin daily. Remove & Discard patch within 12 hours or as directed by MD 30 patch 5  . metFORMIN (GLUCOPHAGE-XR) 500 MG 24 hr tablet TAKE 1 TABLET 3 TIMES DAILY WITH MEALS. 270 tablet 0  . Misc Natural Products (TART CHERRY ADVANCED PO) Take 1 capsule by mouth daily.    . montelukast (SINGULAIR) 10 MG tablet Take 1 tablet (10 mg total) by mouth at bedtime. 90 tablet 1  . mupirocin ointment (BACTROBAN) 2 % Apply 1 application topically 3 (three) times daily. 30 g 1  . ONETOUCH VERIO test strip USE 1 TEST BLOOD SUGAR TWICE DAILY. 100 each 3  . oxybutynin (DITROPAN XL) 15 MG 24 hr tablet Take 1 tablet (15 mg total) by mouth at bedtime. 90 tablet 1  . rifaximin (XIFAXAN) 550 MG TABS tablet TAKE 1 TABLET TWICE DAILY. PRN 60 tablet 3  . TOPROL XL 100 MG 24 hr tablet Take 1 tablet (100 mg total) by mouth 2 (two)  times daily with a meal. 180 tablet 3  . zaleplon (SONATA) 5 MG capsule Take 1 capsule (5 mg total) by mouth at bedtime as needed for sleep. 30 capsule 2   No current facility-administered medications on file prior to visit.    Allergies  Allergen Reactions  . Fruit & Vegetable Daily [Nutritional Supplements] Diarrhea  . Onion Other (See Comments)    indigestion  . Tree Extract Swelling  . Sulfa Drugs Cross Reactors Rash    All over the body   Family History  Problem Relation Age of Onset  . Heart disease Mother        AMI as cause of death age 49.  Marland Kitchen Heart disease Father        AMI as cause of death.  . Heart disease Brother     PE: There were no vitals taken for this visit. There is no height or weight on file to calculate BMI. Wt Readings from Last 3 Encounters:  12/23/18 167 lb (75.8 kg)  07/29/18 166 lb (75.3 kg)  05/13/18 169 lb 9.6 oz (76.9 kg)   Constitutional:  in NAD  The physical exam was not performed (telephone visit).  ASSESSMENT: 1. 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 HL  PLAN:  1. Patient with controlled diabetes on oral antidiabetic regimen with metformin, sulfonylurea, and SGLT 2 inhibitor. -Reviewed latest HbA1c from last visit, which was slightly higher, at 6.8%, however, she had another HbA1c a  month ago which was 6.9% -Based on this, no changes needed in her regimen. -She is planning to restart going to the gym as soon as possible after they reopen -She continues to only check sugars in the morning and she only checked 3 times in the last month -I again advised her to check sugars later in the day, also and let me know if the sugars worsen before next visit - I advised her to: Patient Instructions  Please continue: - Invokana 100 mg in am - Metformin ER 500 mg 3x a day - Glipizide XL 5 mg before b'fast  Please restart: -  Alpha lipoic acid 600 mg 2x a day.  Please come back for a follow-up appointment in 6 months with your sugar log.  - advised to check sugars at different times of the day - 1x a day, rotating check times - advised for yearly eye exams >> she is UTD - return to clinic in 6 months    2. PN -Continues on - fothiamine -However, upon questioning, she did stop daily and her numbness and tingling are worse.  I advised her to restart this.  3. HL - Reviewed latest lipid panel from 12/2018: LDL at goal, HDL low, triglycerides high Lab Results  Component Value Date   CHOL 158 12/19/2018   HDL 36 (L) 12/19/2018   LDLCALC 68 12/19/2018   TRIG 272 (H) 12/19/2018   CHOLHDL 4.4 12/19/2018  - Not on a statin but she continues omega-3 fatty acids 1000 mg 2x a day  without side effects.  - time spent with the patient: 12:30 min, of which >50% was spent in obtaining information about her symptoms, reviewing her previous labs, evaluations, and treatments, counseling her about her condition (please see the discussed topics above), and developing a plan to further investigate and treat it; she had a number of questions which I addressed.  [name], MD PhD [facility] Endocrinology

## 2019-01-27 NOTE — Patient Instructions (Addendum)
Please continue: - Invokana 100 mg in am - Metformin ER 500 mg 3x a day - Glipizide XL 5 mg before b'fast  Please restart: - Alpha lipoic acid 600 mg 2x a day.  Please come back for a follow-up appointment in 6 months with your sugar log.

## 2019-03-06 ENCOUNTER — Other Ambulatory Visit: Payer: Self-pay | Admitting: Family Medicine

## 2019-03-06 ENCOUNTER — Other Ambulatory Visit: Payer: Self-pay | Admitting: Internal Medicine

## 2019-03-06 NOTE — Telephone Encounter (Signed)
Patient was prescribed oxybutynin 15 mg tabs on 12/23/18.

## 2019-06-04 ENCOUNTER — Other Ambulatory Visit: Payer: Self-pay | Admitting: Family Medicine

## 2019-06-04 ENCOUNTER — Other Ambulatory Visit: Payer: Self-pay | Admitting: Internal Medicine

## 2019-06-19 ENCOUNTER — Ambulatory Visit: Payer: Medicare Other | Admitting: Family Medicine

## 2019-07-23 ENCOUNTER — Encounter: Payer: Self-pay | Admitting: Family Medicine

## 2019-07-24 NOTE — Telephone Encounter (Signed)
Copied from [place] [id number]. Topic: Referral - Request for Referral >> Jul 23, 2019  5:04 PM Erick Blinks wrote: Has patient seen PCP for this complaint? No. *If NO, is insurance requiring patient see PCP for this issue before PCP can refer them? Referral for which specialty: Home Health Aid Preferred provider/office: [facility] Re[facility] Reason for referral: Having trouble getting out of bed and maneuvering about   Best contact: [phone]

## 2019-08-07 ENCOUNTER — Ambulatory Visit: Payer: Medicare Other | Admitting: Family Medicine

## 2019-08-08 ENCOUNTER — Ambulatory Visit: Payer: Medicare PPO | Admitting: Family Medicine

## 2019-08-11 ENCOUNTER — Other Ambulatory Visit: Payer: Self-pay | Admitting: Family Medicine

## 2019-08-12 ENCOUNTER — Ambulatory Visit (INDEPENDENT_AMBULATORY_CARE_PROVIDER_SITE_OTHER): Payer: Medicare PPO | Admitting: Family Medicine

## 2019-08-12 ENCOUNTER — Other Ambulatory Visit: Payer: Self-pay

## 2019-08-12 ENCOUNTER — Encounter: Payer: Self-pay | Admitting: Family Medicine

## 2019-08-12 VITALS — BP 134/80 | HR 77 | Temp 97.2°F | Ht 64.0 in | Wt 160.0 lb

## 2019-08-12 DIAGNOSIS — Z9289 Personal history of other medical treatment: Secondary | ICD-10-CM | POA: Diagnosis not present

## 2019-08-12 DIAGNOSIS — R8279 Other abnormal findings on microbiological examination of urine: Secondary | ICD-10-CM | POA: Diagnosis not present

## 2019-08-12 DIAGNOSIS — R4189 Other symptoms and signs involving cognitive functions and awareness: Secondary | ICD-10-CM | POA: Diagnosis not present

## 2019-08-12 DIAGNOSIS — E538 Deficiency of other specified B group vitamins: Secondary | ICD-10-CM | POA: Diagnosis not present

## 2019-08-12 DIAGNOSIS — Z113 Encounter for screening for infections with a predominantly sexual mode of transmission: Secondary | ICD-10-CM | POA: Diagnosis not present

## 2019-08-12 DIAGNOSIS — R29818 Other symptoms and signs involving the nervous system: Secondary | ICD-10-CM | POA: Diagnosis not present

## 2019-08-12 DIAGNOSIS — G9340 Encephalopathy, unspecified: Secondary | ICD-10-CM

## 2019-08-12 DIAGNOSIS — R4689 Other symptoms and signs involving appearance and behavior: Secondary | ICD-10-CM

## 2019-08-12 DIAGNOSIS — I10 Essential (primary) hypertension: Secondary | ICD-10-CM | POA: Diagnosis not present

## 2019-08-12 DIAGNOSIS — Z853 Personal history of malignant neoplasm of breast: Secondary | ICD-10-CM

## 2019-08-12 DIAGNOSIS — R270 Ataxia, unspecified: Secondary | ICD-10-CM

## 2019-08-12 DIAGNOSIS — D751 Secondary polycythemia: Secondary | ICD-10-CM

## 2019-08-12 DIAGNOSIS — E1121 Type 2 diabetes mellitus with diabetic nephropathy: Secondary | ICD-10-CM | POA: Diagnosis not present

## 2019-08-12 NOTE — Progress Notes (Signed)
2/2/20211:37 PM  [name] [name] [birth year], 79 y.o., female [id number]  Chief Complaint  Patient presents with  . Altered Mental Status    pt not able to void    HPI:   Patient is a 79 y.o. 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  Husband reports that often he wakes up around 5am (his usual wake up time) to find her awake and working in the computer then sleeps most of the day Reports thinking and worrying as cause of having trouble falling asleep  Has had new shaking of hands and mouth Dropping object Denies any numbness or tinging Denies any focal weakness  Has donated thousands of dollars to several causes, drinking tonic water and eating sweets in manner not normal for her (usually diabetes very well controlled), hoarding clothes, newspapers, etc  Normal MRI of brain in 2016  Saw geriatric clinic at [facility] in 2017 for problems with balance and fatigue Thought to be exacerbated by meds: benadryl, bentyl, ditropan  Last OV with endo in July 2020 - no concerns  Depression screen [facility] 2/9 08/12/2019 12/23/2018 11/12/2017  Decreased Interest 0 0 0  Down, Depressed, Hopeless 0 0 0  PHQ - 2 Score 0 0 0    Fall Risk  12/23/2018  11/12/2017 05/11/2017 05/07/2017 11/01/2016  Falls in the past year? 0 No Yes Yes Yes  Number falls in past yr: 0 - '1 1 1  ' Injury with Fall? 0 - No - -  Risk for fall due to : - - Impaired balance/gait - -  Risk for fall due to: Comment - - Patient has balance issues - -  Follow up - - Falls prevention discussed - -     Allergies  Allergen Reactions  . Fruit & Vegetable Daily [Nutritional Supplements] Diarrhea  . Onion Other (See Comments)    indigestion  . Tree Extract Swelling  . Sulfa Drugs Cross Reactors Rash    All over the body    Prior to Admission medications   Medication Sig Start Date End Date Taking? Authorizing Provider  aspirin 81 MG tablet Take 81 mg by mouth daily.    [provider]  Blood Glucose Monitoring Suppl (ONE TOUCH ULTRA 2) W/DEVICE KIT Inject 1 application as directed as needed. 08/13/14   [provider]  dicyclomine (BENTYL) 10 MG capsule TAKE 1 CAPSULE 4 TIMES DAILY (BEFORE MEALS AND AT BEDTIME). 06/04/19   Rutherford Guys, MD  fluticasone (FLONASE) 50 MCG/ACT nasal spray Place 2 sprays into both nostrils daily. 12/23/18   Rutherford Guys, MD  furosemide (LASIX) 20 MG tablet Take 1 tablet (20 mg total) by mouth daily. TAKE 1 OR 2 TABLETS DAILY AS DIRECTED. Needs Office  Visit for further refills. 12/23/18   Rutherford Guys, MD  glipiZIDE (GLUCOTROL XL) 5 MG 24 hr tablet TAKE 1 TABLET EVERY DAY WITH BREAKFAST. 06/04/19   [name], MD  INVOKANA 100 MG TABS tablet TAKE 1 TABLET ONCE DAILY. 06/04/19   [name], MD  Lancets (FREESTYLE) lancets CHECK BLOOD SUGAR TWICE DAILY. 01/01/18   [name], MD  metFORMIN (GLUCOPHAGE-XR) 500 MG 24 hr tablet TAKE 1 TABLET 3 TIMES DAILY WITH MEALS. 06/04/19   [name], MD  Misc Natural Products (TART CHERRY ADVANCED PO) Take 1 capsule by mouth daily.    [provider]  mupirocin ointment (BACTROBAN) 2 % Apply 1 application topically 3 (three) times daily. 05/07/17    Wardell Honour, MD  [facility] VERIO test strip USE 1 TEST BLOOD SUGAR TWICE DAILY. 01/01/18   [name], MD  oxybutynin (DITROPAN XL) 15 MG 24 hr tablet Take 1 tablet (15 mg total) by mouth at bedtime. 12/23/18   Rutherford Guys, MD  rifaximin (XIFAXAN) 550 MG TABS tablet TAKE 1 TABLET TWICE DAILY. PRN 01/20/19   Rutherford Guys, MD  TOPROL XL 100 MG 24 hr tablet Take 1 tablet (100 mg total) by mouth 2 (two) times daily with a meal. 12/23/18   Rutherford Guys, MD  zaleplon (SONATA) 5 MG capsule Take 1 capsule (5 mg total) by mouth at bedtime as needed for sleep. 12/23/18   Rutherford Guys, MD    Past Medical History:  Diagnosis Date  . Allergy    [name]; allergy shots weekly.    . Anxiety   . Arthritis    DDD lumbar spine.    . Cancer ([place])    breast L x 2; skin basal cell carcinoma [name]).  . 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 Diabetes mellitus   . Hypertension   . IBS (irritable bowel syndrome)    diarrhea predominant.    Past Surgical History:  Procedure Laterality Date  . BREAST LUMPECTOMY  1996   L breast cancer  . MASTECTOMY  2002   Bilateral for breast cancer  . VAGINAL HYSTERECTOMY  1985   DUB; uterine fibroids; ovaries intact.    Social History   Tobacco Use  . Smoking status: Never Smoker  . Smokeless tobacco: Never Used  Substance Use Topics  . Alcohol use: No    Alcohol/week: 0.0 standard drinks    Comment: rare    Family History  Problem Relation Age of Onset  . Heart disease Mother        AMI as cause of death age 67.  Marland Kitchen Heart disease Father        AMI as cause of death.  . Heart disease Brother     Review of Systems  Constitutional: Negative for chills and fever.  Respiratory: Negative for cough and shortness of breath.   Cardiovascular: Negative for chest pain, palpitations and leg swelling.  Gastrointestinal: Negative for abdominal pain, blood in stool, constipation, diarrhea, melena,  nausea and vomiting.  Genitourinary: Negative for dysuria, frequency, hematuria and urgency.  Musculoskeletal: Negative for back pain, joint pain and myalgias.  Neurological: Negative for dizziness and headaches.  Endo/Heme/Allergies: Negative for polydipsia.  Psychiatric/Behavioral: Negative for depression, hallucinations and memory loss. The patient is not nervous/anxious.   per hpi   OBJECTIVE:  Today's Vitals   08/12/19 1332  BP: 134/80  Pulse: 77  Temp: (!) 97.2 F (36.2 C)  SpO2: 97%  Weight: 160 lb (72.6 kg)  Height: '5\' 4"'  (1.626 m)   Body mass index is 27.46 kg/m.   Physical Exam Vitals and nursing note reviewed.  Constitutional:      Appearance: She is well-developed.  HENT:     Head: Normocephalic and atraumatic.     Right Ear: Hearing, tympanic membrane, ear canal and external ear normal.     Left Ear: Hearing, tympanic membrane, ear canal and external ear normal.     Mouth/Throat:     Mouth: Mucous membranes are moist.     Pharynx: No oropharyngeal exudate or posterior oropharyngeal erythema.  Eyes:     Extraocular Movements: Extraocular movements intact.     Conjunctiva/sclera: Conjunctivae normal.     Pupils: Pupils are equal, round, and reactive to light.  Neck:     Thyroid: No thyromegaly.     Vascular: No carotid bruit.  Cardiovascular:     Rate and Rhythm: Normal rate and regular rhythm.     Heart sounds: Normal heart sounds. No murmur. No friction rub. No gallop.   Pulmonary:     Effort: Pulmonary effort is normal.     Breath sounds: Normal breath sounds. No wheezing, rhonchi or rales.  Abdominal:     General: Bowel sounds are normal. There is no distension.     Palpations: Abdomen is soft. There is no hepatomegaly, splenomegaly or mass.     Tenderness: There is no abdominal tenderness.  Musculoskeletal:        General: Normal range of motion.     Cervical back: Neck supple.     Right lower leg: No edema.     Left lower leg: No edema.   Lymphadenopathy:     Cervical: No cervical adenopathy.  Skin:    General: Skin is warm and dry.  Neurological:     Mental Status: She is alert and oriented to person, place, and time.     Cranial Nerves: No cranial nerve deficit or facial asymmetry.     Sensory: Sensation is intact.     Motor: Tremor (fine hand tremor at rest, lip smaking, tongue fasciculations?) present. No weakness, abnormal muscle tone or pronator drift.     Coordination: Romberg sign negative. Finger-Nose-Finger Test abnormal (left hand misses target). Impaired rapid alternating movements (left hand only).     Gait: Gait abnormal (shuffled, wide gait).     Deep Tendon Reflexes: Reflexes are normal and symmetric.  Psychiatric:        Attention and Perception: Attention normal. She does not perceive auditory or visual hallucinations.        Mood and Affect: Mood and affect normal.        Speech: Speech normal. Speech is not rapid and pressured.        Behavior: Behavior normal.     Comments: MOCA: 21/30, unable to do number/letter pattern or clock properly, unable to name at least 11 words with letter F, unable to do delayed recall of a single word     No results found for this or any previous visit (from the past 24 hour(s)).  No results found.   ASSESSMENT and PLAN  1. Cognitive and behavioral changes 2. Ataxia 3. Cerebellar dysfunction 4. History of breast cancer Patient with recent behavioral and functional changes, abnormal neuro exam. Labs and MRI pending. Referral to neuro and home health. Discussed d/c bentyl and sonata.  - RPR - CBC - CMP14+EGFR - Vitamin B12 - TSH - Hemoglobin A1c -  Urine Culture; Future - MR Brain W Wo Contrast; Future - Ambulatory referral to Neurology - Ambulatory referral to [place]  Total visit between face to face and chart review: 55 minutes  Return for after neuro.    Rutherford Guys, MD Primary Care at [place], [place] [address] Ph.   [phone] Fax [phone]

## 2019-08-12 NOTE — Patient Instructions (Signed)
° ° ° °  If you have lab work done today you will be contacted with your lab results within the next 2 weeks.  If you have not heard from us then please contact us. The fastest way to get your results is to register for My Chart. ° ° °IF you received an x-ray today, you will receive an invoice from [facility]. Please contact [facility] at [phone] with questions or concerns regarding your invoice.  ° °IF you received labwork today, you will receive an invoice from [facility]. Please contact [facility] at [phone] with questions or concerns regarding your invoice.  ° °Our billing staff will not be able to assist you with questions regarding bills from these companies. ° °You will be contacted with the lab results as soon as they are available. The fastest way to get your results is to activate your My Chart account. Instructions are located on the last page of this paperwork. If you have not heard from us regarding the results in 2 weeks, please contact this office. °  ° ° ° °

## 2019-08-13 LAB — CMP14+EGFR
ALT: 8 IU/L (ref 0–32)
AST: 12 IU/L (ref 0–40)
Albumin/Globulin Ratio: 1.9 (ref 1.2–2.2)
Albumin: 4.7 g/dL (ref 3.7–4.7)
Alkaline Phosphatase: 94 IU/L (ref 39–117)
BUN/Creatinine Ratio: 19 (ref 12–28)
BUN: 21 mg/dL (ref 8–27)
Bilirubin Total: 0.3 mg/dL (ref 0.0–1.2)
CO2: 20 mmol/L (ref 20–29)
Calcium: 9.9 mg/dL (ref 8.7–10.3)
Chloride: 101 mmol/L (ref 96–106)
Creatinine, Ser: 1.1 mg/dL — ABNORMAL HIGH (ref 0.57–1.00)
GFR calc Af Amer: 56 mL/min/{1.73_m2} — ABNORMAL LOW (ref >59)
GFR calc non Af Amer: 48 mL/min/{1.73_m2} — ABNORMAL LOW (ref >59)
Globulin, Total: 2.5 g/dL (ref 1.5–4.5)
Glucose: 143 mg/dL — ABNORMAL HIGH (ref 65–99)
Potassium: 3.9 mmol/L (ref 3.5–5.2)
Sodium: 140 mmol/L (ref 134–144)
Total Protein: 7.2 g/dL (ref 6.0–8.5)

## 2019-08-13 LAB — CBC
Hematocrit: 47.3 % — ABNORMAL HIGH (ref 34.0–46.6)
Hemoglobin: 16.3 g/dL — ABNORMAL HIGH (ref 11.1–15.9)
MCH: 28.8 pg (ref 26.6–33.0)
MCHC: 34.5 g/dL (ref 31.5–35.7)
MCV: 84 fL (ref 79–97)
Platelets: 451 10*3/uL — ABNORMAL HIGH (ref 150–450)
RBC: 5.65 x10E6/uL — ABNORMAL HIGH (ref 3.77–5.28)
RDW: 13 % (ref 11.7–15.4)
WBC: 10.2 10*3/uL (ref 3.4–10.8)

## 2019-08-13 LAB — TSH: TSH: 3.1 u[IU]/mL (ref 0.450–4.500)

## 2019-08-13 LAB — VITAMIN B12: Vitamin B-12: >2000 pg/mL — ABNORMAL HIGH (ref 232–1245)

## 2019-08-13 LAB — HEMOGLOBIN A1C
Est. average glucose Bld gHb Est-mCnc: 146 mg/dL
Hgb A1c MFr Bld: 6.7 % — ABNORMAL HIGH (ref 4.8–5.6)

## 2019-08-13 LAB — RPR: RPR Ser Ql: NONREACTIVE

## 2019-08-13 NOTE — Addendum Note (Signed)
Addended by: Meredeth Ide on: 08/13/2019 04:10 PM   Modules accepted: Orders

## 2019-08-15 LAB — URINE CULTURE

## 2019-08-17 ENCOUNTER — Ambulatory Visit: Payer: Medicare PPO | Attending: Internal Medicine

## 2019-08-17 DIAGNOSIS — Z23 Encounter for immunization: Secondary | ICD-10-CM | POA: Insufficient documentation

## 2019-08-17 NOTE — Progress Notes (Signed)
   Covid-19 Vaccination Clinic  Name:  [name] [name]    MRN: [id number] DOB: 11-Jun-1941  08/17/2019  [name] [name] was observed post Covid-19 immunization for 15 minutes without incidence. She was provided with Vaccine Information Sheet and instruction to access the V-Safe system.   [name] [name] was instructed to call 911 with any severe reactions post vaccine: Marland Kitchen Difficulty breathing  . Swelling of your face and throat  . A fast heartbeat  . A bad rash all over your body  . Dizziness and weakness    Immunizations Administered    Name Date Dose VIS Date Route   Pfizer COVID-19 Vaccine 08/17/2019 10:51 AM 0.3 mL 06/20/2019 Intramuscular   Manufacturer: [place]   Lot: [id number]   [place]: [id number]

## 2019-08-21 ENCOUNTER — Telehealth: Payer: Self-pay | Admitting: Family Medicine

## 2019-08-21 NOTE — Telephone Encounter (Signed)
The [phone] [id number]

## 2019-08-21 NOTE — Telephone Encounter (Signed)
Verbal order has been given.  

## 2019-08-21 NOTE — Telephone Encounter (Signed)
[facility] called needing verbal confirmation for physical therapy can call   [phone] Ext:[phone] please advise.

## 2019-08-22 ENCOUNTER — Telehealth: Payer: Self-pay | Admitting: Family Medicine

## 2019-08-22 DIAGNOSIS — R270 Ataxia, unspecified: Secondary | ICD-10-CM | POA: Diagnosis not present

## 2019-08-22 DIAGNOSIS — G4700 Insomnia, unspecified: Secondary | ICD-10-CM | POA: Diagnosis not present

## 2019-08-22 DIAGNOSIS — K589 Irritable bowel syndrome without diarrhea: Secondary | ICD-10-CM | POA: Diagnosis not present

## 2019-08-22 DIAGNOSIS — K219 Gastro-esophageal reflux disease without esophagitis: Secondary | ICD-10-CM | POA: Diagnosis not present

## 2019-08-22 DIAGNOSIS — E119 Type 2 diabetes mellitus without complications: Secondary | ICD-10-CM | POA: Diagnosis not present

## 2019-08-22 DIAGNOSIS — D689 Coagulation defect, unspecified: Secondary | ICD-10-CM | POA: Diagnosis not present

## 2019-08-22 DIAGNOSIS — I10 Essential (primary) hypertension: Secondary | ICD-10-CM | POA: Diagnosis not present

## 2019-08-22 DIAGNOSIS — G9389 Other specified disorders of brain: Secondary | ICD-10-CM | POA: Diagnosis not present

## 2019-08-22 DIAGNOSIS — M5136 Other intervertebral disc degeneration, lumbar region: Secondary | ICD-10-CM | POA: Diagnosis not present

## 2019-08-22 NOTE — Telephone Encounter (Signed)
Verbal okay given.  

## 2019-08-22 NOTE — Telephone Encounter (Signed)
[name] [name] turned [name] [name] again with  [facility] .  Maggie tried to care for [patiernt 2 days in a row / and now she is agreeing to making  skilled nursing start of care for Sunday . They just need a verbal please call at [phone] ok to LVM

## 2019-08-24 DIAGNOSIS — I10 Essential (primary) hypertension: Secondary | ICD-10-CM | POA: Diagnosis not present

## 2019-08-24 DIAGNOSIS — E119 Type 2 diabetes mellitus without complications: Secondary | ICD-10-CM | POA: Diagnosis not present

## 2019-08-24 DIAGNOSIS — G4700 Insomnia, unspecified: Secondary | ICD-10-CM | POA: Diagnosis not present

## 2019-08-24 DIAGNOSIS — R270 Ataxia, unspecified: Secondary | ICD-10-CM | POA: Diagnosis not present

## 2019-08-24 DIAGNOSIS — G9389 Other specified disorders of brain: Secondary | ICD-10-CM | POA: Diagnosis not present

## 2019-08-24 DIAGNOSIS — D689 Coagulation defect, unspecified: Secondary | ICD-10-CM | POA: Diagnosis not present

## 2019-08-24 DIAGNOSIS — K589 Irritable bowel syndrome without diarrhea: Secondary | ICD-10-CM | POA: Diagnosis not present

## 2019-08-24 DIAGNOSIS — K219 Gastro-esophageal reflux disease without esophagitis: Secondary | ICD-10-CM | POA: Diagnosis not present

## 2019-08-24 DIAGNOSIS — M5136 Other intervertebral disc degeneration, lumbar region: Secondary | ICD-10-CM | POA: Diagnosis not present

## 2019-08-25 ENCOUNTER — Telehealth: Payer: Self-pay | Admitting: *Deleted

## 2019-08-25 NOTE — Telephone Encounter (Signed)
Mail box full  Schedule AWV

## 2019-08-26 ENCOUNTER — Telehealth: Payer: Self-pay

## 2019-08-26 ENCOUNTER — Telehealth: Payer: Self-pay | Admitting: Family Medicine

## 2019-08-26 ENCOUNTER — Ambulatory Visit: Payer: Medicare Other | Admitting: Family Medicine

## 2019-08-26 NOTE — Telephone Encounter (Signed)
Flora at requesting PT for 2 week 3 times per week, 1 week once per week, and 2 weeks at once per week and verbal ok given per [place].

## 2019-08-26 NOTE — Telephone Encounter (Signed)
Tonya from [facility] called to report pt is donating thousands of dollars online. Drinking large amounts of tonic water. She's hoarding items and is OCD. She's not checking her blood sugar and is eating a lot of sugar. Pt is staying up all night and sleeping all day. Nurse believes she is not taking medication but pt will not allow anyone to touch her items.   Pt's husband would like for wife to see neuro. Nurse recommended psych eval. She would also like verbal orders to return in one month. CB is [phone] Nurse:Kenney Houseman

## 2019-08-26 NOTE — Addendum Note (Signed)
Addended by: Rutherford Guys on: 08/26/2019 01:18 PM   Modules accepted: Orders

## 2019-08-26 NOTE — Telephone Encounter (Signed)
Spoke with Lavella Lemons the nurse gave approval for a return visit in one month.   Was Advised a neuro referral was put in for patient I will follow up with neuro.

## 2019-09-01 ENCOUNTER — Other Ambulatory Visit: Payer: Self-pay | Admitting: Internal Medicine

## 2019-09-01 ENCOUNTER — Ambulatory Visit: Payer: Medicare Other

## 2019-09-01 ENCOUNTER — Other Ambulatory Visit: Payer: Self-pay | Admitting: Family Medicine

## 2019-09-01 NOTE — Telephone Encounter (Signed)
Requested medications are due for refill today?  Not on active med list  Requested medications are on active medication list?  No  Last Refill:  historical information - 12/23/2018 # 90 with 1 refill.    Future visit scheduled?  No  Notes to Clinic:

## 2019-09-01 NOTE — Telephone Encounter (Signed)
Requested Prescriptions  Pending Prescriptions Disp Refills  . montelukast (SINGULAIR) 10 MG tablet [Pharmacy Med Name: MONTELUKAST SOD 10 MG TABLET] 90 tablet 0    Sig: TAKE ONE TABLET AT BEDTIME.     Pulmonology:  Leukotriene Inhibitors Passed - 09/01/2019 11:48 AM      Passed - Valid encounter within last 12 months    Recent Outpatient Visits          2 weeks ago Cognitive and behavioral changes   [facility] Lilia Argue, MD   8 months ago Essential hypertension   [facility], Lilia Argue, MD   8 months ago Controlled type 2 diabetes mellitus with diabetic nephropathy, without long-term current use of insulin [facility])   [facility], Lilia Argue, MD   1 year ago Controlled type 2 diabetes mellitus with diabetic nephropathy, without long-term current use of insulin [facility])   [facility], Lilia Argue, MD   1 year ago Controlled type 2 diabetes mellitus with diabetic nephropathy, without long-term current use of insulin ([place])   [facility], Lilia Argue, MD             . oxybutynin (DITROPAN XL) 15 MG 24 hr tablet [Pharmacy Med Name: OXYBUTYNIN CL ER 15 MG TABLET] 90 tablet 0    Sig: TAKE ONE TABLET AT BEDTIME.     Urology:  Bladder Agents Passed - 09/01/2019 11:48 AM      Passed - Valid encounter within last 12 months    Recent Outpatient Visits          2 weeks ago Cognitive and behavioral changes   [facility], Lilia Argue, MD   8 months ago Essential hypertension   [facility], Lilia Argue, MD   8 months ago Controlled type 2 diabetes mellitus with diabetic nephropathy, without long-term current use of insulin [facility])   [facility], Lilia Argue, MD   1 year ago Controlled type 2 diabetes mellitus with diabetic nephropathy, without long-term current use of insulin [facility])   [facility], Lilia Argue, MD   1 year ago Controlled type 2 diabetes mellitus  with diabetic nephropathy, without long-term current use of insulin [facility])   [facility], Lilia Argue, MD             . furosemide (LASIX) 20 MG tablet [Pharmacy Med Name: FUROSEMIDE 20 MG TABLET] 90 tablet 0    Sig: TAKE 1 OR 2 TABLETS DAILY AS DIRECTED.     Cardiovascular:  Diuretics - Loop Failed - 09/01/2019 11:48 AM      Failed - Cr in normal range and within 360 days    Creat  Date Value Ref Range Status  11/12/2015 1.25 (H) 0.60 - 0.93 mg/dL Final   Creatinine, Ser  Date Value Ref Range Status  08/12/2019 1.10 (H) 0.57 - 1.00 mg/dL Final         Passed - K in normal range and within 360 days    Potassium  Date Value Ref Range Status  08/12/2019 3.9 3.5 - 5.2 mmol/L Final         Passed - Ca in normal range and within 360 days    Calcium  Date Value Ref Range Status  08/12/2019 9.9 8.7 - 10.3 mg/dL Final         Passed - Na in normal range  and within 360 days    Sodium  Date Value Ref Range Status  08/12/2019 140 134 - 144 mmol/L Final         Passed - Last BP in normal range    BP Readings from Last 1 Encounters:  08/12/19 134/80         Passed - Valid encounter within last 6 months    Recent Outpatient Visits          2 weeks ago Cognitive and behavioral changes   [facility], Lilia Argue, MD   8 months ago Essential hypertension   [facility], Lilia Argue, MD   8 months ago Controlled type 2 diabetes mellitus with diabetic nephropathy, without long-term current use of insulin [facility])   [facility], Lilia Argue, MD   1 year ago Controlled type 2 diabetes mellitus with diabetic nephropathy, without long-term current use of insulin [facility])   [facility], Lilia Argue, MD   1 year ago Controlled type 2 diabetes mellitus with diabetic nephropathy, without long-term current use of insulin [facility])   [facility], Lilia Argue, MD

## 2019-09-04 DIAGNOSIS — H35371 Puckering of macula, right eye: Secondary | ICD-10-CM | POA: Diagnosis not present

## 2019-09-04 DIAGNOSIS — H04123 Dry eye syndrome of bilateral lacrimal glands: Secondary | ICD-10-CM | POA: Diagnosis not present

## 2019-09-04 DIAGNOSIS — H26492 Other secondary cataract, left eye: Secondary | ICD-10-CM | POA: Diagnosis not present

## 2019-09-04 DIAGNOSIS — E119 Type 2 diabetes mellitus without complications: Secondary | ICD-10-CM | POA: Diagnosis not present

## 2019-09-04 DIAGNOSIS — H10413 Chronic giant papillary conjunctivitis, bilateral: Secondary | ICD-10-CM | POA: Diagnosis not present

## 2019-09-04 DIAGNOSIS — Z961 Presence of intraocular lens: Secondary | ICD-10-CM | POA: Diagnosis not present

## 2019-09-05 DIAGNOSIS — E119 Type 2 diabetes mellitus without complications: Secondary | ICD-10-CM | POA: Diagnosis not present

## 2019-09-05 DIAGNOSIS — R270 Ataxia, unspecified: Secondary | ICD-10-CM | POA: Diagnosis not present

## 2019-09-05 DIAGNOSIS — K219 Gastro-esophageal reflux disease without esophagitis: Secondary | ICD-10-CM | POA: Diagnosis not present

## 2019-09-05 DIAGNOSIS — K589 Irritable bowel syndrome without diarrhea: Secondary | ICD-10-CM | POA: Diagnosis not present

## 2019-09-05 DIAGNOSIS — I10 Essential (primary) hypertension: Secondary | ICD-10-CM | POA: Diagnosis not present

## 2019-09-05 DIAGNOSIS — M5136 Other intervertebral disc degeneration, lumbar region: Secondary | ICD-10-CM | POA: Diagnosis not present

## 2019-09-05 DIAGNOSIS — G9389 Other specified disorders of brain: Secondary | ICD-10-CM | POA: Diagnosis not present

## 2019-09-05 DIAGNOSIS — G4700 Insomnia, unspecified: Secondary | ICD-10-CM | POA: Diagnosis not present

## 2019-09-05 DIAGNOSIS — D689 Coagulation defect, unspecified: Secondary | ICD-10-CM | POA: Diagnosis not present

## 2019-09-09 ENCOUNTER — Ambulatory Visit: Payer: Medicare PPO

## 2019-09-09 ENCOUNTER — Other Ambulatory Visit: Payer: Self-pay

## 2019-09-09 DIAGNOSIS — D751 Secondary polycythemia: Secondary | ICD-10-CM | POA: Diagnosis not present

## 2019-09-10 ENCOUNTER — Ambulatory Visit: Payer: Medicare PPO | Attending: Internal Medicine

## 2019-09-10 ENCOUNTER — Ambulatory Visit: Payer: Medicare PPO

## 2019-09-10 DIAGNOSIS — Z23 Encounter for immunization: Secondary | ICD-10-CM

## 2019-09-10 LAB — CBC
Hematocrit: 46.1 % (ref 34.0–46.6)
Hemoglobin: 15.5 g/dL (ref 11.1–15.9)
MCH: 28.2 pg (ref 26.6–33.0)
MCHC: 33.6 g/dL (ref 31.5–35.7)
MCV: 84 fL (ref 79–97)
Platelets: 396 10*3/uL (ref 150–450)
RBC: 5.5 x10E6/uL — ABNORMAL HIGH (ref 3.77–5.28)
RDW: 12.7 % (ref 11.7–15.4)
WBC: 9.2 10*3/uL (ref 3.4–10.8)

## 2019-09-10 NOTE — Progress Notes (Signed)
   Covid-19 Vaccination Clinic  Name:  HARLAN BONAFEDE    MRN: [id number] DOB: 10/23/1940  09/10/2019  Ms. Knapke was observed post Covid-19 immunization for 15 minutes without incident. She was provided with Vaccine Information Sheet and instruction to access the V-Safe system.   Ms. Mcelhone was instructed to call 911 with any severe reactions post vaccine: Marland Kitchen Difficulty breathing  . Swelling of face and throat  . A fast heartbeat  . A bad rash all over body  . Dizziness and weakness   Immunizations Administered    Name Date Dose VIS Date Route   Pfizer COVID-19 Vaccine 09/10/2019  8:34 AM 0.3 mL 06/20/2019 Intramuscular   Manufacturer: [place]   Lot: [id number]   [place]: [id number]

## 2019-09-12 DIAGNOSIS — D689 Coagulation defect, unspecified: Secondary | ICD-10-CM | POA: Diagnosis not present

## 2019-09-12 DIAGNOSIS — E119 Type 2 diabetes mellitus without complications: Secondary | ICD-10-CM | POA: Diagnosis not present

## 2019-09-12 DIAGNOSIS — G9389 Other specified disorders of brain: Secondary | ICD-10-CM | POA: Diagnosis not present

## 2019-09-12 DIAGNOSIS — I10 Essential (primary) hypertension: Secondary | ICD-10-CM | POA: Diagnosis not present

## 2019-09-12 DIAGNOSIS — G4700 Insomnia, unspecified: Secondary | ICD-10-CM | POA: Diagnosis not present

## 2019-09-12 DIAGNOSIS — M5136 Other intervertebral disc degeneration, lumbar region: Secondary | ICD-10-CM | POA: Diagnosis not present

## 2019-09-12 DIAGNOSIS — K589 Irritable bowel syndrome without diarrhea: Secondary | ICD-10-CM | POA: Diagnosis not present

## 2019-09-12 DIAGNOSIS — K219 Gastro-esophageal reflux disease without esophagitis: Secondary | ICD-10-CM | POA: Diagnosis not present

## 2019-09-12 DIAGNOSIS — R270 Ataxia, unspecified: Secondary | ICD-10-CM | POA: Diagnosis not present

## 2019-09-14 ENCOUNTER — Other Ambulatory Visit: Payer: Self-pay | Admitting: Family Medicine

## 2019-09-14 NOTE — Telephone Encounter (Signed)
Requested medication (s) are due for refill today: yes  Requested medication (s) are on the active medication list: no  Last refill:  12/23/18 #30 2 RF  Future visit scheduled: no  Notes to clinic:  medication not delegated to NT to refill   Requested Prescriptions  Pending Prescriptions Disp Refills   zaleplon (SONATA) 5 MG capsule [Pharmacy Med Name: ZALEPLON 5 MG CAPSULE] 30 capsule 0    Sig: TAKE 1 CAPSULE AT BEDTIME AS NEEDED FOR SLEEP.      Not Delegated - Psychiatry:  Anxiolytics/Hypnotics Failed - 09/14/2019  3:32 PM      Failed - This refill cannot be delegated      Failed - Urine Drug Screen completed in last 360 days.      Passed - Valid encounter within last 6 months    Recent Outpatient Visits           1 month ago Cognitive and behavioral changes   [facility], Lilia Argue, MD   8 months ago Essential hypertension   [facility], Lilia Argue, MD   8 months ago Controlled type 2 diabetes mellitus with diabetic nephropathy, without long-term current use of insulin [facility])   [facility], Lilia Argue, MD   1 year ago Controlled type 2 diabetes mellitus with diabetic nephropathy, without long-term current use of insulin [facility])   [facility], Lilia Argue, MD   1 year ago Controlled type 2 diabetes mellitus with diabetic nephropathy, without long-term current use of insulin [facility])   [facility], Lilia Argue, MD

## 2019-09-15 ENCOUNTER — Other Ambulatory Visit: Payer: Self-pay

## 2019-09-15 ENCOUNTER — Ambulatory Visit
Admission: RE | Admit: 2019-09-15 | Discharge: 2019-09-15 | Disposition: A | Discharge status: Home / Self Care | Payer: Medicare PPO | Source: Ambulatory Visit | Attending: Family Medicine | Admitting: Family Medicine

## 2019-09-15 DIAGNOSIS — R4189 Other symptoms and signs involving cognitive functions and awareness: Secondary | ICD-10-CM

## 2019-09-15 DIAGNOSIS — R4689 Other symptoms and signs involving appearance and behavior: Secondary | ICD-10-CM

## 2019-09-15 DIAGNOSIS — R29818 Other symptoms and signs involving the nervous system: Secondary | ICD-10-CM

## 2019-09-15 DIAGNOSIS — Z853 Personal history of malignant neoplasm of breast: Secondary | ICD-10-CM

## 2019-09-15 DIAGNOSIS — H538 Other visual disturbances: Secondary | ICD-10-CM | POA: Diagnosis not present

## 2019-09-15 DIAGNOSIS — R270 Ataxia, unspecified: Secondary | ICD-10-CM

## 2019-09-15 DIAGNOSIS — G9340 Encephalopathy, unspecified: Secondary | ICD-10-CM

## 2019-09-15 MED ORDER — GADOBENATE DIMEGLUMINE 529 MG/ML IV SOLN
15.0000 mL | Freq: Once | INTRAVENOUS | Status: AC | PRN
  Administered 2019-09-15: 15 mL via INTRAVENOUS

## 2019-09-16 ENCOUNTER — Encounter: Payer: Self-pay | Admitting: Family Medicine

## 2019-09-16 ENCOUNTER — Telehealth (INDEPENDENT_AMBULATORY_CARE_PROVIDER_SITE_OTHER): Payer: Medicare PPO | Admitting: Family Medicine

## 2019-09-16 DIAGNOSIS — R4689 Other symptoms and signs involving appearance and behavior: Secondary | ICD-10-CM

## 2019-09-16 DIAGNOSIS — D689 Coagulation defect, unspecified: Secondary | ICD-10-CM | POA: Diagnosis not present

## 2019-09-16 DIAGNOSIS — R4189 Other symptoms and signs involving cognitive functions and awareness: Secondary | ICD-10-CM | POA: Diagnosis not present

## 2019-09-16 DIAGNOSIS — G9389 Other specified disorders of brain: Secondary | ICD-10-CM | POA: Diagnosis not present

## 2019-09-16 DIAGNOSIS — M5136 Other intervertebral disc degeneration, lumbar region: Secondary | ICD-10-CM | POA: Diagnosis not present

## 2019-09-16 DIAGNOSIS — G4700 Insomnia, unspecified: Secondary | ICD-10-CM | POA: Diagnosis not present

## 2019-09-16 DIAGNOSIS — E119 Type 2 diabetes mellitus without complications: Secondary | ICD-10-CM | POA: Diagnosis not present

## 2019-09-16 DIAGNOSIS — K589 Irritable bowel syndrome without diarrhea: Secondary | ICD-10-CM | POA: Diagnosis not present

## 2019-09-16 DIAGNOSIS — K219 Gastro-esophageal reflux disease without esophagitis: Secondary | ICD-10-CM | POA: Diagnosis not present

## 2019-09-16 DIAGNOSIS — I10 Essential (primary) hypertension: Secondary | ICD-10-CM | POA: Diagnosis not present

## 2019-09-16 DIAGNOSIS — R270 Ataxia, unspecified: Secondary | ICD-10-CM | POA: Diagnosis not present

## 2019-09-16 NOTE — Progress Notes (Signed)
Virtual Visit Note  I connected with patient on 09/16/19 at 501pm by video doximity and verified that I am speaking with the correct person using two identifiers. [name] [name] is currently located at home and patient and her husband is currently with them during visit. The provider, Rutherford Guys, MD is located in their office at time of visit.  I discussed the limitations, risks, security and privacy concerns of performing an evaluation and management service by telephone and the availability of in person appointments. I also discussed with the patient that there may be a patient responsible charge related to this service. The patient expressed understanding and agreed to proceed.   I provided 20 minutes of non-face-to-face time during this encounter.  CC: changes in behavior  HPI ? 79 yo F with PMH LEFT breast cancer, DM2, HTN, IBS, GERD, UI and insomnia who presents today for behavioral changes  Last OV a month ago Stopped sonata and bentyl Labs, MRI brain, HH and neuro Labs normal other than polycythemia, repeat CBC normal MRI brain: Moderate atrophy. Mild progression of white matter changes most likely due to chronic microvascular ischemia. No acute infarct Has appt with neuro September 30 2019 [facility] referral sent to [facility]  Overall patient doing stable Sleeps varies, continues with mostly insomnia- unable to fall asleep at all, sometimes sleeps well Her husband reports that after stopping bentyl and sonata she has shown improved in time management, remembering tasks and not slipping out of the bed or sofa no changes in impulsive spending or gait Home health has been out to the home She has never had a sleep study  BP Readings from Last 3 Encounters:  08/12/19 134/80  12/23/18 123/73  07/29/18 120/70    Allergies  Allergen Reactions  . Fruit & Vegetable Daily [Nutritional Supplements] Diarrhea  . Onion Other (See Comments)    indigestion  . Tree Extract Swelling  .  Sulfa Drugs Cross Reactors Rash    All over the body    Prior to Admission medications   Medication Sig Start Date End Date Taking? Authorizing Provider  aspirin 81 MG tablet Take 81 mg by mouth daily.   Yes [provider]  Blood Glucose Monitoring Suppl (ONE TOUCH ULTRA 2) W/DEVICE KIT Inject 1 application as directed as needed. 08/13/14  Yes [provider]  fluticasone (FLONASE) 50 MCG/ACT nasal spray Place 2 sprays into both nostrils daily. 12/23/18  Yes Rutherford Guys, MD  furosemide (LASIX) 20 MG tablet TAKE 1 OR 2 TABLETS DAILY AS DIRECTED. 09/01/19  Yes Rutherford Guys, MD  glipiZIDE (GLUCOTROL XL) 5 MG 24 hr tablet TAKE 1 TABLET EVERY DAY WITH BREAKFAST. 09/01/19  Yes [name], MD  INVOKANA 100 MG TABS tablet TAKE 1 TABLET ONCE DAILY. 09/01/19  Yes [name], MD  Lancets (FREESTYLE) lancets CHECK BLOOD SUGAR TWICE DAILY. 01/01/18  Yes [name], MD  metFORMIN (GLUCOPHAGE-XR) 500 MG 24 hr tablet TAKE 1 TABLET 3 TIMES DAILY WITH MEALS. 09/01/19  Yes [name], MD  Misc Natural Products (TART CHERRY ADVANCED PO) Take 1 capsule by mouth daily.   Yes [provider]  montelukast (SINGULAIR) 10 MG tablet TAKE ONE TABLET AT BEDTIME. 09/01/19  Yes Rutherford Guys, MD  [facility] VERIO test strip USE 1 TEST BLOOD SUGAR TWICE DAILY. 01/01/18  Yes [name], MD  oxybutynin (DITROPAN XL) 15 MG 24 hr tablet TAKE ONE TABLET AT BEDTIME. 09/01/19  Yes Rutherford Guys, MD  rifaximin Doreene Nest)  550 MG TABS tablet TAKE 1 TABLET TWICE DAILY. PRN 01/20/19  Yes Rutherford Guys, MD  TOPROL XL 100 MG 24 hr tablet Take 1 tablet (100 mg total) by mouth 2 (two) times daily with a meal. 12/23/18  Yes Rutherford Guys, MD    Past Medical History:  Diagnosis Date  . Allergy    [name]; allergy shots weekly.    . Anxiety   . Arthritis    DDD lumbar spine.    . Cancer ([place])    breast L x 2; skin basal cell carcinoma [name]).  . 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 Diabetes mellitus   . Hypertension   . IBS (irritable bowel syndrome)    diarrhea predominant.    Past Surgical History:  Procedure Laterality Date  . BREAST LUMPECTOMY  1996   L breast cancer  . MASTECTOMY  2002   Bilateral for breast cancer  . VAGINAL HYSTERECTOMY  1985   DUB; uterine fibroids; ovaries intact.    Social History   Tobacco Use  . Smoking status: Never Smoker  . Smokeless tobacco: Never Used  Substance Use Topics  . Alcohol use: No    Alcohol/week: 0.0 standard drinks    Comment: rare    Family History  Problem Relation Age of Onset  . Heart disease Mother        AMI as cause of death age 44.  Marland Kitchen Heart disease Father        AMI as cause of death.  . Heart disease Brother     ROS Per hpi  Objective  Vitals as reported by the patient:  GEN: AAOx3, NAD HEENT: [place]/AT, pupils are symmetrical, EOMI, non-icteric sclera Resp: breathing comfortably, speaking in full sentences Skin: no rashes noted, no pallor Psych: good eye contact, normal mood and affect   ASSESSMENT and PLAN  1. Cognitive and behavioral changes Mild improvement w dc sonata and bentyl. No acute changes in labs or mri. Dementia? Neuro appt pending. Sleep study?  FOLLOW-UP: 4 weeks, after neuro   The above assessment and management plan was discussed with the patient. The patient verbalized understanding of and has agreed to the management plan. Patient is aware to call the clinic if symptoms persist or worsen. Patient is aware when to return to the clinic for a follow-up visit. Patient educated on when it is appropriate to go to the emergency department.     Rutherford Guys, MD [facility] [place], [place] [address] Ph.  [phone] Fax [phone]

## 2019-09-17 ENCOUNTER — Ambulatory Visit: Payer: Medicare PPO | Admitting: Diagnostic Neuroimaging

## 2019-09-21 DIAGNOSIS — E119 Type 2 diabetes mellitus without complications: Secondary | ICD-10-CM | POA: Diagnosis not present

## 2019-09-21 DIAGNOSIS — G9389 Other specified disorders of brain: Secondary | ICD-10-CM | POA: Diagnosis not present

## 2019-09-21 DIAGNOSIS — K589 Irritable bowel syndrome without diarrhea: Secondary | ICD-10-CM | POA: Diagnosis not present

## 2019-09-21 DIAGNOSIS — I10 Essential (primary) hypertension: Secondary | ICD-10-CM | POA: Diagnosis not present

## 2019-09-21 DIAGNOSIS — R270 Ataxia, unspecified: Secondary | ICD-10-CM | POA: Diagnosis not present

## 2019-09-21 DIAGNOSIS — D689 Coagulation defect, unspecified: Secondary | ICD-10-CM | POA: Diagnosis not present

## 2019-09-21 DIAGNOSIS — K219 Gastro-esophageal reflux disease without esophagitis: Secondary | ICD-10-CM | POA: Diagnosis not present

## 2019-09-21 DIAGNOSIS — M5136 Other intervertebral disc degeneration, lumbar region: Secondary | ICD-10-CM | POA: Diagnosis not present

## 2019-09-21 DIAGNOSIS — G4700 Insomnia, unspecified: Secondary | ICD-10-CM | POA: Diagnosis not present

## 2019-09-23 DIAGNOSIS — K589 Irritable bowel syndrome without diarrhea: Secondary | ICD-10-CM | POA: Diagnosis not present

## 2019-09-23 DIAGNOSIS — E119 Type 2 diabetes mellitus without complications: Secondary | ICD-10-CM | POA: Diagnosis not present

## 2019-09-23 DIAGNOSIS — K219 Gastro-esophageal reflux disease without esophagitis: Secondary | ICD-10-CM | POA: Diagnosis not present

## 2019-09-23 DIAGNOSIS — M5136 Other intervertebral disc degeneration, lumbar region: Secondary | ICD-10-CM | POA: Diagnosis not present

## 2019-09-23 DIAGNOSIS — G4700 Insomnia, unspecified: Secondary | ICD-10-CM | POA: Diagnosis not present

## 2019-09-23 DIAGNOSIS — G9389 Other specified disorders of brain: Secondary | ICD-10-CM | POA: Diagnosis not present

## 2019-09-23 DIAGNOSIS — R270 Ataxia, unspecified: Secondary | ICD-10-CM | POA: Diagnosis not present

## 2019-09-23 DIAGNOSIS — D689 Coagulation defect, unspecified: Secondary | ICD-10-CM | POA: Diagnosis not present

## 2019-09-23 DIAGNOSIS — I10 Essential (primary) hypertension: Secondary | ICD-10-CM | POA: Diagnosis not present

## 2019-09-25 ENCOUNTER — Telehealth: Payer: Self-pay | Admitting: Family Medicine

## 2019-09-25 DIAGNOSIS — G9389 Other specified disorders of brain: Secondary | ICD-10-CM | POA: Diagnosis not present

## 2019-09-25 DIAGNOSIS — D689 Coagulation defect, unspecified: Secondary | ICD-10-CM | POA: Diagnosis not present

## 2019-09-25 DIAGNOSIS — G4700 Insomnia, unspecified: Secondary | ICD-10-CM | POA: Diagnosis not present

## 2019-09-25 DIAGNOSIS — K219 Gastro-esophageal reflux disease without esophagitis: Secondary | ICD-10-CM | POA: Diagnosis not present

## 2019-09-25 DIAGNOSIS — I10 Essential (primary) hypertension: Secondary | ICD-10-CM | POA: Diagnosis not present

## 2019-09-25 DIAGNOSIS — R270 Ataxia, unspecified: Secondary | ICD-10-CM | POA: Diagnosis not present

## 2019-09-25 DIAGNOSIS — E119 Type 2 diabetes mellitus without complications: Secondary | ICD-10-CM | POA: Diagnosis not present

## 2019-09-25 DIAGNOSIS — M5136 Other intervertebral disc degeneration, lumbar region: Secondary | ICD-10-CM | POA: Diagnosis not present

## 2019-09-25 DIAGNOSIS — K589 Irritable bowel syndrome without diarrhea: Secondary | ICD-10-CM | POA: Diagnosis not present

## 2019-09-25 NOTE — Telephone Encounter (Signed)
.  Verbal Order Request  Name of [place]: [facility]   Name of [place] Number:[phone]  PCP [place]:  What Orders are being requested:physical therapy ext home health 2 times a week for 3 weeks and balance training and gate training

## 2019-09-26 NOTE — Telephone Encounter (Signed)
Called, left message for caregiver

## 2019-09-30 ENCOUNTER — Encounter: Payer: Self-pay | Admitting: Diagnostic Neuroimaging

## 2019-09-30 ENCOUNTER — Other Ambulatory Visit: Payer: Self-pay

## 2019-09-30 ENCOUNTER — Ambulatory Visit: Payer: Medicare PPO | Admitting: Diagnostic Neuroimaging

## 2019-09-30 VITALS — BP 117/65 | HR 73 | Temp 97.6°F | Ht 64.5 in | Wt 159.8 lb

## 2019-09-30 DIAGNOSIS — R4689 Other symptoms and signs involving appearance and behavior: Secondary | ICD-10-CM | POA: Diagnosis not present

## 2019-09-30 DIAGNOSIS — R269 Unspecified abnormalities of gait and mobility: Secondary | ICD-10-CM

## 2019-09-30 NOTE — Progress Notes (Signed)
[facility]  PATIENT: KATHREN SCEARCE DOB: 09/23/1940  REFERRING CLINICIAN: Rutherford Guys, MD HISTORY FROM: patient  REASON FOR VISIT: new consult    HISTORICAL  CHIEF COMPLAINT:  Chief Complaint  Patient presents with  . Cognitive and behavioral changes    rm 6 New Pt husbandRichardson Landry  MMSE 25    HISTORY OF PRESENT ILLNESS:   79 year old female here for evaluation of cognitive behavioral changes.  History of hypertension, diabetes, anxiety, arthritis and allergies.  For past 6 to 12 months patient has had gradual onset and progressive change in sleep cycle, gait, behaviors, tremors, personality.  She has had to start using a cane for balance.  She has short shuffling gait.  She drops food and spills frequently.  She has had excessive online spending/donations much greater than normal.  She is excessively focused on watching television.  She tends to pile clothes foods newspapers and sweets on her bed.  She tends to be more tardy with her appointments.  These are mainly noted by patient's husband.  Patient does not notice these problems that much.  Has long history of difficulty falling asleep.  Has been on sleep aids for several years.  These were discontinued 2 months ago.  Prior to discontinuing medications she had been staying up later later, essentially staying up all night and sleeping all day.  Patient has long history of allergies, has used Benadryl for many years for allergy control.  She noticed over the years the Benadryl will also help her go to sleep.  Patient has a Scientist, water quality in education.  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  She retired in 2005.    REVIEW OF SYSTEMS: Full 14 system review of systems performed and negative with exception of: As per HPI.  ALLERGIES: Allergies  Allergen Reactions  . Fruit & Vegetable Daily [Nutritional Supplements] Diarrhea  . Onion Other (See Comments)    indigestion  .  Tree Extract Swelling  . Sulfa Drugs Cross Reactors Rash    All over the body    HOME MEDICATIONS: Outpatient Medications Prior to Visit  Medication Sig Dispense Refill  . aspirin 81 MG tablet Take 81 mg by mouth daily.    . Blood Glucose Monitoring Suppl (ONE TOUCH ULTRA 2) W/DEVICE KIT Inject 1 application as directed as needed.    . fluticasone (FLONASE) 50 MCG/ACT nasal spray Place 2 sprays into both nostrils daily. 48 g 3  . furosemide (LASIX) 20 MG tablet TAKE 1 OR 2 TABLETS DAILY AS DIRECTED. 90 tablet 0  . glipiZIDE (GLUCOTROL XL) 5 MG 24 hr tablet TAKE 1 TABLET EVERY DAY WITH BREAKFAST. 90 tablet 0  . INVOKANA 100 MG TABS tablet TAKE 1 TABLET ONCE DAILY. 90 tablet 0  . Lancets (FREESTYLE) lancets CHECK BLOOD SUGAR TWICE DAILY. 100 each 0  . metFORMIN (GLUCOPHAGE-XR) 500 MG 24 hr tablet TAKE 1 TABLET 3 TIMES DAILY WITH MEALS. 270 tablet 0  . Misc Natural Products (TART CHERRY ADVANCED PO) Take 1 capsule by mouth daily.    . montelukast (SINGULAIR) 10 MG tablet TAKE ONE TABLET AT BEDTIME. 90 tablet 0  . ONETOUCH VERIO test strip USE 1 TEST BLOOD SUGAR TWICE DAILY. 100 each 3  . oxybutynin (DITROPAN XL) 15 MG 24 hr tablet TAKE ONE TABLET AT BEDTIME. 90 tablet 0  . rifaximin (XIFAXAN) 550 MG TABS tablet TAKE 1 TABLET TWICE DAILY. PRN 60 tablet 3  . TOPROL XL 100  MG 24 hr tablet Take 1 tablet (100 mg total) by mouth 2 (two) times daily with a meal. 180 tablet 3  . zaleplon (SONATA) 5 MG capsule Take 5 mg by mouth at bedtime as needed for sleep.     No facility-administered medications prior to visit.    PAST MEDICAL HISTORY: Past Medical History:  Diagnosis Date  . Allergy    [name]; allergy shots weekly.    . Anxiety   . Arthritis    DDD lumbar spine.    . Cancer ([place])    breast L x 2; skin basal cell carcinoma [name]).  . 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 Diabetes mellitus   . Hypertension   . IBS (irritable bowel syndrome)     diarrhea predominant.    PAST SURGICAL HISTORY: Past Surgical History:  Procedure Laterality Date  . BREAST LUMPECTOMY  1996   L breast cancer  . MASTECTOMY  2002   Bilateral for breast cancer  . VAGINAL HYSTERECTOMY  1985   DUB; uterine fibroids; ovaries intact.    FAMILY HISTORY: Family History  Problem Relation Age of Onset  . Heart disease Mother        AMI as cause of death age 43.  Marland Kitchen Heart disease Father        AMI as cause of death.  . Heart disease Brother     SOCIAL HISTORY: Social History   Socioeconomic History  . Marital status: Married    Spouse name: Richardson Landry  . Number of children: Not on file  . Years of education: Not on file  . Highest education level: Master's degree (e.g., MA, MS, MEng, MEd, MSW, MBA)  Occupational History  . Occupation: retired    Comment: retired  Tobacco Use  . Smoking status: Never Smoker  . Smokeless tobacco: Never Used  Substance and Sexual Activity  . Alcohol use: No    Alcohol/week: 0.0 standard drinks    Comment: rare  . Drug use: No  . Sexual activity: Never  Other Topics Concern  . 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.      Tobacco: never      Alcohol: rarely;       Exercise:  Silver Sneakers; Tai Chi once per week; exercises 3 days per week; balance class      ADLs: independent with ADLs; drives.  Husband does the cleaning and lifting groceries.      Advanced Directives: YES;  FULL CODE no prolonged measures.              Social Determinants of Health   Financial Resource Strain:   . Difficulty of Paying Living Expenses:   Food Insecurity:   . Worried About Charity fundraiser in the Last Year:   . Arboriculturist in the Last Year:   Transportation Needs:     . Film/video editor (Medical):   Marland Kitchen Lack of Transportation (Non-Medical):   Physical Activity:   . Days of Exercise per Week:   . Minutes of Exercise per Session:   Stress:   . Feeling of Stress :   Social Connections:   .  Frequency of Communication with Friends and Family:   . Frequency of Social Gatherings with Friends and Family:   . Attends Religious Services:   . Active Member of Clubs or Organizations:   . Attends Archivist Meetings:   Marland Kitchen Marital Status:   Intimate Partner Violence:   . Fear of Current or Ex-Partner:   . Emotionally Abused:   Marland Kitchen Physically Abused:   . Sexually Abused:      PHYSICAL EXAM  GENERAL EXAM/CONSTITUTIONAL: Vitals:  Vitals:   09/30/19 1313  BP: 117/65  Pulse: 73  Temp: 97.6 F (36.4 C)  Weight: 159 lb 12.8 oz (72.5 kg)  Height: 5' 4.5" (1.638 m)     Body mass index is 27.01 kg/m. Wt Readings from Last 3 Encounters:  09/30/19 159 lb 12.8 oz (72.5 kg)  08/12/19 160 lb (72.6 kg)  12/23/18 167 lb (75.8 kg)     Patient is in no distress; well developed, nourished and groomed; neck is supple  CARDIOVASCULAR:  Examination of carotid arteries is normal; no carotid bruits  Regular rate and rhythm, no murmurs  Examination of peripheral vascular system by observation and palpation is normal  EYES:  Ophthalmoscopic exam of optic discs and posterior segments is normal; no papilledema or hemorrhages  No exam data present  MUSCULOSKELETAL:  Gait, strength, tone, movements noted in Neurologic exam below  NEUROLOGIC: MENTAL STATUS:  MMSE - Mini Mental State Exam 09/30/2019  Orientation to time 5  Orientation to Place 4  Registration 3  Attention/ Calculation 3  Recall 3  Language- name 2 objects 2  Language- repeat 0  Language- follow 3 step command 3  Language- read & follow direction 1  Write a sentence 1  Copy design 0  Total score 25    awake, alert, oriented to person, place and time  recent and  remote memory intact  normal attention and concentration  language fluent, comprehension intact, naming intact  fund of knowledge appropriate  CRANIAL NERVE:   2nd - no papilledema on fundoscopic exam  2nd, 3rd, 4th, 6th - pupils equal and reactive to light, visual fields full to confrontation, extraocular muscles intact, no nystagmus  5th - facial sensation symmetric  7th - facial strength symmetric  8th - hearing intact  9th - palate elevates symmetrically, uvula midline  11th - shoulder shrug symmetric  12th - tongue protrusion midline  INTERMITTENT MOUTH TREMOR  MOTOR:   normal bulk and tone, 4/5 strength in the BUE, BLE  SENSORY:   normal and symmetric to light touch, temperature, vibration; DECR IN FEET  COORDINATION:   finger-nose-finger, fine finger movements SLOW   REFLEXES:   deep tendon reflexes TRACE and symmetric  GAIT/STATION:   SHORT STEPS; UNSTEADY GAIT     DIAGNOSTIC DATA (LABS, IMAGING, TESTING) - I reviewed patient records, labs, notes, testing and imaging myself where available.  Lab Results  Component Value Date   WBC 9.2 09/09/2019   HGB 15.5 09/09/2019   HCT 46.1 09/09/2019   MCV 84 09/09/2019   PLT 396 09/09/2019      Component Value Date/Time   NA 140 08/12/2019 1442   K 3.9 08/12/2019 1442   CL 101 08/12/2019 1442   CO2 20 08/12/2019 1442   GLUCOSE 143 (H) 08/12/2019 1442   GLUCOSE 95 11/12/2015 1017   BUN 21 08/12/2019 1442   CREATININE 1.10 (H) 08/12/2019 1442   CREATININE 1.25 (H) 11/12/2015 1017   CALCIUM 9.9 08/12/2019 1442   PROT 7.2  08/12/2019 1442   ALBUMIN 4.7 08/12/2019 1442   AST 12 08/12/2019 1442   ALT 8 08/12/2019 1442   ALKPHOS 94 08/12/2019 1442   BILITOT 0.3 08/12/2019 1442   GFRNONAA 48 (L) 08/12/2019 1442   GFRNONAA 65 07/15/2014 1206   GFRAA 56 (L) 08/12/2019 1442   GFRAA 75 07/15/2014 1206   Lab Results  Component Value Date   CHOL 158 12/19/2018   HDL 36 (L) 12/19/2018   LDLCALC 68  12/19/2018   TRIG 272 (H) 12/19/2018   CHOLHDL 4.4 12/19/2018   Lab Results  Component Value Date   HGBA1C 6.7 (H) 08/12/2019   Lab Results  Component Value Date   VITAMINB12 >2000 (H) 08/12/2019   Lab Results  Component Value Date   TSH 3.100 08/12/2019    09/15/19 MRI brain [I reviewed images myself and agree with interpretation. Similar to 2016 MRI. -VRP]  - Moderate atrophy. Mild progression of white matter changes most likely due to chronic microvascular ischemia. No acute infarct.     ASSESSMENT AND PLAN  79 y.o. year old female here with:  Dx:  1. Gait difficulty   2. Behavioral change     PLAN:  COGNITIVE / BEHAVIOR CHANGES / BRAIN ATROPHY (MMSE 25/30; decreased insight; more prominent behavior changes; possible neurodegenerative dementia, such as behavioral variant fronto-temporal dementia; dementia with lewy bodies also possible) - reviewed non-pharmacologic ways to improve sleep hygiene - consider memantine 39m at bedtime; increase to twice a day after 1-2 weeks - safety / supervision issues reviewed - caregiver resources provided - improve nutrition, exercise, brain stimulating activities - no driving; caution with finances and medications  Return for pending if symptoms worsen or fail to improve.    VPenni Bombard MD 31/62/4469 15:07PM Certified in Neurology, Neurophysiology and Neuroimaging  [facility] [address] SN[place] ParkGLake [place] [phone]

## 2019-09-30 NOTE — Patient Instructions (Signed)
COGNITIVE / BEHAVIOR CHANGES (possible dementia) - improve sleep hygiene - consider memantine 10mg  at bedtime; increase to twice a day after 1-2 weeks - safety / supervision issues reviewed - caregiver resources provided - no driving; caution with finances and medications

## 2019-10-08 DIAGNOSIS — G4700 Insomnia, unspecified: Secondary | ICD-10-CM | POA: Diagnosis not present

## 2019-10-08 DIAGNOSIS — G9389 Other specified disorders of brain: Secondary | ICD-10-CM | POA: Diagnosis not present

## 2019-10-08 DIAGNOSIS — K589 Irritable bowel syndrome without diarrhea: Secondary | ICD-10-CM | POA: Diagnosis not present

## 2019-10-08 DIAGNOSIS — M5136 Other intervertebral disc degeneration, lumbar region: Secondary | ICD-10-CM | POA: Diagnosis not present

## 2019-10-08 DIAGNOSIS — E119 Type 2 diabetes mellitus without complications: Secondary | ICD-10-CM | POA: Diagnosis not present

## 2019-10-08 DIAGNOSIS — R270 Ataxia, unspecified: Secondary | ICD-10-CM | POA: Diagnosis not present

## 2019-10-08 DIAGNOSIS — K219 Gastro-esophageal reflux disease without esophagitis: Secondary | ICD-10-CM | POA: Diagnosis not present

## 2019-10-08 DIAGNOSIS — I10 Essential (primary) hypertension: Secondary | ICD-10-CM | POA: Diagnosis not present

## 2019-10-08 DIAGNOSIS — D689 Coagulation defect, unspecified: Secondary | ICD-10-CM | POA: Diagnosis not present

## 2019-10-10 DIAGNOSIS — I10 Essential (primary) hypertension: Secondary | ICD-10-CM | POA: Diagnosis not present

## 2019-10-10 DIAGNOSIS — K589 Irritable bowel syndrome without diarrhea: Secondary | ICD-10-CM | POA: Diagnosis not present

## 2019-10-10 DIAGNOSIS — M5136 Other intervertebral disc degeneration, lumbar region: Secondary | ICD-10-CM | POA: Diagnosis not present

## 2019-10-10 DIAGNOSIS — D689 Coagulation defect, unspecified: Secondary | ICD-10-CM | POA: Diagnosis not present

## 2019-10-10 DIAGNOSIS — G9389 Other specified disorders of brain: Secondary | ICD-10-CM | POA: Diagnosis not present

## 2019-10-10 DIAGNOSIS — R270 Ataxia, unspecified: Secondary | ICD-10-CM | POA: Diagnosis not present

## 2019-10-10 DIAGNOSIS — E119 Type 2 diabetes mellitus without complications: Secondary | ICD-10-CM | POA: Diagnosis not present

## 2019-10-10 DIAGNOSIS — G4700 Insomnia, unspecified: Secondary | ICD-10-CM | POA: Diagnosis not present

## 2019-10-10 DIAGNOSIS — K219 Gastro-esophageal reflux disease without esophagitis: Secondary | ICD-10-CM | POA: Diagnosis not present

## 2019-10-13 DIAGNOSIS — I10 Essential (primary) hypertension: Secondary | ICD-10-CM | POA: Diagnosis not present

## 2019-10-13 DIAGNOSIS — K589 Irritable bowel syndrome without diarrhea: Secondary | ICD-10-CM | POA: Diagnosis not present

## 2019-10-13 DIAGNOSIS — G9389 Other specified disorders of brain: Secondary | ICD-10-CM | POA: Diagnosis not present

## 2019-10-13 DIAGNOSIS — G4700 Insomnia, unspecified: Secondary | ICD-10-CM | POA: Diagnosis not present

## 2019-10-13 DIAGNOSIS — E119 Type 2 diabetes mellitus without complications: Secondary | ICD-10-CM | POA: Diagnosis not present

## 2019-10-13 DIAGNOSIS — R270 Ataxia, unspecified: Secondary | ICD-10-CM | POA: Diagnosis not present

## 2019-10-13 DIAGNOSIS — M5136 Other intervertebral disc degeneration, lumbar region: Secondary | ICD-10-CM | POA: Diagnosis not present

## 2019-10-13 DIAGNOSIS — D689 Coagulation defect, unspecified: Secondary | ICD-10-CM | POA: Diagnosis not present

## 2019-10-13 DIAGNOSIS — K219 Gastro-esophageal reflux disease without esophagitis: Secondary | ICD-10-CM | POA: Diagnosis not present

## 2019-10-16 DIAGNOSIS — I10 Essential (primary) hypertension: Secondary | ICD-10-CM | POA: Diagnosis not present

## 2019-10-16 DIAGNOSIS — D689 Coagulation defect, unspecified: Secondary | ICD-10-CM | POA: Diagnosis not present

## 2019-10-16 DIAGNOSIS — K589 Irritable bowel syndrome without diarrhea: Secondary | ICD-10-CM | POA: Diagnosis not present

## 2019-10-16 DIAGNOSIS — R270 Ataxia, unspecified: Secondary | ICD-10-CM | POA: Diagnosis not present

## 2019-10-16 DIAGNOSIS — G4700 Insomnia, unspecified: Secondary | ICD-10-CM | POA: Diagnosis not present

## 2019-10-16 DIAGNOSIS — K219 Gastro-esophageal reflux disease without esophagitis: Secondary | ICD-10-CM | POA: Diagnosis not present

## 2019-10-16 DIAGNOSIS — E119 Type 2 diabetes mellitus without complications: Secondary | ICD-10-CM | POA: Diagnosis not present

## 2019-10-16 DIAGNOSIS — G9389 Other specified disorders of brain: Secondary | ICD-10-CM | POA: Diagnosis not present

## 2019-10-16 DIAGNOSIS — M5136 Other intervertebral disc degeneration, lumbar region: Secondary | ICD-10-CM | POA: Diagnosis not present

## 2019-10-21 ENCOUNTER — Telehealth: Payer: Self-pay | Admitting: Family Medicine

## 2019-10-21 DIAGNOSIS — D689 Coagulation defect, unspecified: Secondary | ICD-10-CM | POA: Diagnosis not present

## 2019-10-21 DIAGNOSIS — M5136 Other intervertebral disc degeneration, lumbar region: Secondary | ICD-10-CM | POA: Diagnosis not present

## 2019-10-21 DIAGNOSIS — G4700 Insomnia, unspecified: Secondary | ICD-10-CM | POA: Diagnosis not present

## 2019-10-21 DIAGNOSIS — G9389 Other specified disorders of brain: Secondary | ICD-10-CM | POA: Diagnosis not present

## 2019-10-21 DIAGNOSIS — E119 Type 2 diabetes mellitus without complications: Secondary | ICD-10-CM | POA: Diagnosis not present

## 2019-10-21 DIAGNOSIS — K589 Irritable bowel syndrome without diarrhea: Secondary | ICD-10-CM | POA: Diagnosis not present

## 2019-10-21 DIAGNOSIS — I10 Essential (primary) hypertension: Secondary | ICD-10-CM | POA: Diagnosis not present

## 2019-10-21 DIAGNOSIS — R270 Ataxia, unspecified: Secondary | ICD-10-CM | POA: Diagnosis not present

## 2019-10-21 DIAGNOSIS — K219 Gastro-esophageal reflux disease without esophagitis: Secondary | ICD-10-CM | POA: Diagnosis not present

## 2019-10-21 NOTE — Telephone Encounter (Signed)
Spoke with maggie and ok the verbal orders for pt.

## 2019-10-21 NOTE — Telephone Encounter (Signed)
Maggie calling back seeking approval for PT 1 week 5 , 2 week 1    Please advise   Please call Burman Nieves just needs verbal [phone] ok to leave message

## 2019-10-24 DIAGNOSIS — D689 Coagulation defect, unspecified: Secondary | ICD-10-CM | POA: Diagnosis not present

## 2019-10-24 DIAGNOSIS — G9389 Other specified disorders of brain: Secondary | ICD-10-CM | POA: Diagnosis not present

## 2019-10-24 DIAGNOSIS — K219 Gastro-esophageal reflux disease without esophagitis: Secondary | ICD-10-CM | POA: Diagnosis not present

## 2019-10-24 DIAGNOSIS — R270 Ataxia, unspecified: Secondary | ICD-10-CM | POA: Diagnosis not present

## 2019-10-24 DIAGNOSIS — G4700 Insomnia, unspecified: Secondary | ICD-10-CM | POA: Diagnosis not present

## 2019-10-24 DIAGNOSIS — I10 Essential (primary) hypertension: Secondary | ICD-10-CM | POA: Diagnosis not present

## 2019-10-24 DIAGNOSIS — M5136 Other intervertebral disc degeneration, lumbar region: Secondary | ICD-10-CM | POA: Diagnosis not present

## 2019-10-24 DIAGNOSIS — K589 Irritable bowel syndrome without diarrhea: Secondary | ICD-10-CM | POA: Diagnosis not present

## 2019-10-24 DIAGNOSIS — E119 Type 2 diabetes mellitus without complications: Secondary | ICD-10-CM | POA: Diagnosis not present

## 2019-11-05 DIAGNOSIS — G9389 Other specified disorders of brain: Secondary | ICD-10-CM | POA: Diagnosis not present

## 2019-11-05 DIAGNOSIS — I10 Essential (primary) hypertension: Secondary | ICD-10-CM | POA: Diagnosis not present

## 2019-11-05 DIAGNOSIS — K589 Irritable bowel syndrome without diarrhea: Secondary | ICD-10-CM | POA: Diagnosis not present

## 2019-11-05 DIAGNOSIS — D689 Coagulation defect, unspecified: Secondary | ICD-10-CM | POA: Diagnosis not present

## 2019-11-05 DIAGNOSIS — R270 Ataxia, unspecified: Secondary | ICD-10-CM | POA: Diagnosis not present

## 2019-11-05 DIAGNOSIS — K219 Gastro-esophageal reflux disease without esophagitis: Secondary | ICD-10-CM | POA: Diagnosis not present

## 2019-11-05 DIAGNOSIS — G4700 Insomnia, unspecified: Secondary | ICD-10-CM | POA: Diagnosis not present

## 2019-11-05 DIAGNOSIS — E119 Type 2 diabetes mellitus without complications: Secondary | ICD-10-CM | POA: Diagnosis not present

## 2019-11-05 DIAGNOSIS — M5136 Other intervertebral disc degeneration, lumbar region: Secondary | ICD-10-CM | POA: Diagnosis not present

## 2019-11-11 DIAGNOSIS — G9389 Other specified disorders of brain: Secondary | ICD-10-CM | POA: Diagnosis not present

## 2019-11-11 DIAGNOSIS — K589 Irritable bowel syndrome without diarrhea: Secondary | ICD-10-CM | POA: Diagnosis not present

## 2019-11-11 DIAGNOSIS — R270 Ataxia, unspecified: Secondary | ICD-10-CM | POA: Diagnosis not present

## 2019-11-11 DIAGNOSIS — I10 Essential (primary) hypertension: Secondary | ICD-10-CM | POA: Diagnosis not present

## 2019-11-11 DIAGNOSIS — E119 Type 2 diabetes mellitus without complications: Secondary | ICD-10-CM | POA: Diagnosis not present

## 2019-11-11 DIAGNOSIS — M5136 Other intervertebral disc degeneration, lumbar region: Secondary | ICD-10-CM | POA: Diagnosis not present

## 2019-11-11 DIAGNOSIS — D689 Coagulation defect, unspecified: Secondary | ICD-10-CM | POA: Diagnosis not present

## 2019-11-11 DIAGNOSIS — K219 Gastro-esophageal reflux disease without esophagitis: Secondary | ICD-10-CM | POA: Diagnosis not present

## 2019-11-11 DIAGNOSIS — G4700 Insomnia, unspecified: Secondary | ICD-10-CM | POA: Diagnosis not present

## 2019-11-19 DIAGNOSIS — R270 Ataxia, unspecified: Secondary | ICD-10-CM | POA: Diagnosis not present

## 2019-11-19 DIAGNOSIS — K219 Gastro-esophageal reflux disease without esophagitis: Secondary | ICD-10-CM | POA: Diagnosis not present

## 2019-11-19 DIAGNOSIS — G9389 Other specified disorders of brain: Secondary | ICD-10-CM | POA: Diagnosis not present

## 2019-11-19 DIAGNOSIS — G4700 Insomnia, unspecified: Secondary | ICD-10-CM | POA: Diagnosis not present

## 2019-11-19 DIAGNOSIS — D689 Coagulation defect, unspecified: Secondary | ICD-10-CM | POA: Diagnosis not present

## 2019-11-19 DIAGNOSIS — M5136 Other intervertebral disc degeneration, lumbar region: Secondary | ICD-10-CM | POA: Diagnosis not present

## 2019-11-19 DIAGNOSIS — K589 Irritable bowel syndrome without diarrhea: Secondary | ICD-10-CM | POA: Diagnosis not present

## 2019-11-19 DIAGNOSIS — I10 Essential (primary) hypertension: Secondary | ICD-10-CM | POA: Diagnosis not present

## 2019-11-19 DIAGNOSIS — E119 Type 2 diabetes mellitus without complications: Secondary | ICD-10-CM | POA: Diagnosis not present

## 2019-11-20 DIAGNOSIS — M5136 Other intervertebral disc degeneration, lumbar region: Secondary | ICD-10-CM | POA: Diagnosis not present

## 2019-11-20 DIAGNOSIS — G4700 Insomnia, unspecified: Secondary | ICD-10-CM | POA: Diagnosis not present

## 2019-11-20 DIAGNOSIS — G9389 Other specified disorders of brain: Secondary | ICD-10-CM | POA: Diagnosis not present

## 2019-11-20 DIAGNOSIS — I10 Essential (primary) hypertension: Secondary | ICD-10-CM | POA: Diagnosis not present

## 2019-11-20 DIAGNOSIS — K219 Gastro-esophageal reflux disease without esophagitis: Secondary | ICD-10-CM | POA: Diagnosis not present

## 2019-11-20 DIAGNOSIS — D689 Coagulation defect, unspecified: Secondary | ICD-10-CM | POA: Diagnosis not present

## 2019-11-20 DIAGNOSIS — K589 Irritable bowel syndrome without diarrhea: Secondary | ICD-10-CM | POA: Diagnosis not present

## 2019-11-20 DIAGNOSIS — E119 Type 2 diabetes mellitus without complications: Secondary | ICD-10-CM | POA: Diagnosis not present

## 2019-11-20 DIAGNOSIS — R270 Ataxia, unspecified: Secondary | ICD-10-CM | POA: Diagnosis not present

## 2019-11-24 DIAGNOSIS — K589 Irritable bowel syndrome without diarrhea: Secondary | ICD-10-CM | POA: Diagnosis not present

## 2019-11-24 DIAGNOSIS — R270 Ataxia, unspecified: Secondary | ICD-10-CM | POA: Diagnosis not present

## 2019-11-24 DIAGNOSIS — D689 Coagulation defect, unspecified: Secondary | ICD-10-CM | POA: Diagnosis not present

## 2019-11-24 DIAGNOSIS — G9389 Other specified disorders of brain: Secondary | ICD-10-CM | POA: Diagnosis not present

## 2019-11-24 DIAGNOSIS — M5136 Other intervertebral disc degeneration, lumbar region: Secondary | ICD-10-CM | POA: Diagnosis not present

## 2019-11-24 DIAGNOSIS — E119 Type 2 diabetes mellitus without complications: Secondary | ICD-10-CM | POA: Diagnosis not present

## 2019-11-24 DIAGNOSIS — K219 Gastro-esophageal reflux disease without esophagitis: Secondary | ICD-10-CM | POA: Diagnosis not present

## 2019-11-24 DIAGNOSIS — G4700 Insomnia, unspecified: Secondary | ICD-10-CM | POA: Diagnosis not present

## 2019-11-24 DIAGNOSIS — I10 Essential (primary) hypertension: Secondary | ICD-10-CM | POA: Diagnosis not present

## 2019-11-27 ENCOUNTER — Other Ambulatory Visit: Payer: Self-pay

## 2019-11-27 ENCOUNTER — Encounter: Payer: Self-pay | Admitting: Internal Medicine

## 2019-11-27 ENCOUNTER — Ambulatory Visit: Payer: Medicare PPO | Admitting: Internal Medicine

## 2019-11-27 VITALS — BP 140/70 | HR 78 | Ht 64.5 in | Wt 160.0 lb

## 2019-11-27 DIAGNOSIS — E785 Hyperlipidemia, unspecified: Secondary | ICD-10-CM | POA: Diagnosis not present

## 2019-11-27 DIAGNOSIS — E1121 Type 2 diabetes mellitus with diabetic nephropathy: Secondary | ICD-10-CM | POA: Diagnosis not present

## 2019-11-27 DIAGNOSIS — G629 Polyneuropathy, unspecified: Secondary | ICD-10-CM

## 2019-11-27 LAB — POCT GLYCOSYLATED HEMOGLOBIN (HGB A1C): Hemoglobin A1C: 6.2 % — AB (ref 4.0–5.6)

## 2019-11-27 MED ORDER — METFORMIN HCL ER 500 MG PO TB24: ORAL_TABLET | ORAL | 3 refills | Status: DC

## 2019-11-27 MED ORDER — CANAGLIFLOZIN 100 MG PO TABS: 100.0000 mg | ORAL_TABLET | Freq: Every day | ORAL | 3 refills | Status: DC

## 2019-11-27 MED ORDER — GLIPIZIDE ER 5 MG PO TB24: ORAL_TABLET | ORAL | 3 refills | Status: DC

## 2019-11-27 NOTE — Progress Notes (Signed)
Patient ID: [name] [name], female   DOB: 1941/01/25, 79 y.o.   MRN: [id number]  This visit occurred during the SARS-CoV-2 public health emergency.  Safety protocols were in place, including screening questions prior to the visit, additional usage of staff PPE, and extensive cleaning of exam room while observing appropriate contact time as indicated for disinfecting solutions.   HPI: [name] [name] is a 79 y.o.-year-old female, returning for follow-up for DM2, dx 2014, non-insulin-dependent, now controlled, with complications (mild CKD, PN). Last visit 10 mo ago. She is here with her husband.  He is the one that usually organizes her medicines.  Reviewed HbA1c levels: Lab Results  Component Value Date   HGBA1C 6.7 (H) 08/12/2019   HGBA1C 6.9 (H) 12/19/2018   HGBA1C 6.8 (A) 07/29/2018   She continues on: - Invokana 100 mg in am - Metformin ER 500 mg 3x a day >> 1500 mg with dinner - Glipizide XL 5 mg before b'fast  Pt is not checking sugars  - Prev: - A.m.: 120s,160 >> 123-160 >> 112, 133 x2, 165 (forgot meds) - 2 hours post breakfast: 96, 139 >> n/c - Before lunch:  95-144 >> 126-133 >> n/c  - 2 hours post lunch:  95, 131, 187 >> 83 >> n/c - before dinner:  n/c >> 93, 111 >> 79-153 >> n/c - 2 hours post dinner: 101, 103 >> n/c >> 140 >> n/c - Bedtime:  116-169 >> n/c >> 102 >> n/c Lowest: 79 >> .Marland Kitchen.123 >> 112 >> ? Highest: 160 >> 165 >> ?.  Pt's meals are: - Breakfast: apple + cottage cheese + whole wheat - Lunch: leftovers from dinner; sandwich; salad - Dinner: varies; some fast food - Snacks: veggies; chips + some diet sodas.  Before the coronavirus pandemic, she was exercising consistently: Silver sneakers at the [facility] 3 times a week, tai chi once a week.  However, she stopped exercising after the facilities closed.  -+ CKD, last BUN/creatinine:  Lab Results  Component Value Date   BUN 21 08/12/2019   CREATININE 1.10 (H) 08/12/2019  Not on ACE inhibitor/ARB. -+ HL;  last set of lipids: Lab Results  Component Value Date   CHOL 158 12/19/2018   HDL 36 (L) 12/19/2018   LDLCALC 68 12/19/2018   TRIG 272 (H) 12/19/2018   CHOLHDL 4.4 12/19/2018  Not on a statin, but is on omega-3 fatty acids. - last eye exam was in 2021: No DR reportedly (Dr. Katy Fitch); + history of cataract surgery - she has numbness and tingling in her feet.  On B complex.  She came off alpha-lipoic acid before last visit and we restarted it. She used Lidocaine patches >> discontinued.  We started PN cream , but this was expensive (93$). Voltaren gel works a little better. Tried Neurontin >> did not like it. She read about possible side effects from Lyrica >> would not want to try it.  She had stool incontinence >> better after switching to metformin ER  She is a friend of Dr. Milta Deiters.  ROS: Constitutional: no weight gain/no weight loss, no fatigue, no subjective hyperthermia, no subjective hypothermia Eyes: no blurry vision, no xerophthalmia ENT: no sore throat, no nodules palpated in neck, no dysphagia, no odynophagia, no hoarseness Cardiovascular: no CP/no SOB/no palpitations/no leg swelling Respiratory: no cough/no SOB/no wheezing Gastrointestinal: no N/no V/no D/no C/no acid reflux Musculoskeletal: no muscle aches/no joint aches Skin: no rashes, no hair loss Neurological: no tremors/+ numbness/+ tingling/no dizziness  I reviewed  pt's medications, allergies, PMH, social hx, family hx, and changes were documented in the history of present illness. Otherwise, unchanged from my initial visit note.  Past Medical History:  Diagnosis Date  . Allergy    [name]; allergy shots weekly.    . Anxiety   . Arthritis    DDD lumbar spine.    . Cancer ([place])    breast L x 2; skin basal cell carcinoma [name]).  . 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 Diabetes mellitus   . Hypertension   . IBS (irritable bowel syndrome)    diarrhea  predominant.   Past Surgical History:  Procedure Laterality Date  . BREAST LUMPECTOMY  1996   L breast cancer  . MASTECTOMY  2002   Bilateral for breast cancer  . VAGINAL HYSTERECTOMY  1985   DUB; uterine fibroids; ovaries intact.   Social History   Socioeconomic History  . Marital status: Married    Spouse name: Richardson Landry  . Number of children: Not on file  . Years of education: Not on file  . Highest education level: Master's degree (e.g., MA, MS, MEng, MEd, MSW, MBA)  Occupational History  . Occupation: retired    Comment: retired  Tobacco Use  . Smoking status: Never Smoker  . Smokeless tobacco: Never Used  Substance and Sexual Activity  . Alcohol use: No    Alcohol/week: 0.0 standard drinks    Comment: rare  . Drug use: No  . Sexual activity: Never  Other Topics Concern  . 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.      Tobacco: never      Alcohol: rarely;       Exercise:  Silver Sneakers; Tai Chi once per week; exercises 3 days per week; balance class      ADLs: independent with ADLs; drives.  Husband does the cleaning and lifting groceries.      Advanced Directives: YES;  FULL CODE no prolonged measures.              Social Determinants of Health   Financial Resource Strain:   . Difficulty of Paying Living Expenses:   Food Insecurity:   . Worried About Charity fundraiser in the Last Year:   . Arboriculturist in the Last Year:   Transportation Needs:   . Film/video editor (Medical):   Marland Kitchen Lack of Transportation (Non-Medical):   Physical Activity:   . Days of Exercise per Week:   . Minutes of Exercise per Session:   Stress:   . Feeling of Stress :   Social Connections:   . Frequency of Communication with  Friends and Family:   . Frequency of Social Gatherings with Friends and Family:   . Attends Religious Services:   . Active Member of Clubs or Organizations:   . Attends Archivist Meetings:   Marland Kitchen Marital Status:   Intimate Partner Violence:   . Fear of Current or Ex-Partner:   . Emotionally Abused:   Marland Kitchen Physically Abused:   . Sexually Abused:    Current Outpatient Medications on File Prior to Visit  Medication Sig Dispense Refill  .  aspirin 81 MG tablet Take 81 mg by mouth daily.    . Blood Glucose Monitoring Suppl (ONE TOUCH ULTRA 2) W/DEVICE KIT Inject 1 application as directed as needed.    . fluticasone (FLONASE) 50 MCG/ACT nasal spray Place 2 sprays into both nostrils daily. 48 g 3  . furosemide (LASIX) 20 MG tablet TAKE 1 OR 2 TABLETS DAILY AS DIRECTED. 90 tablet 0  . glipiZIDE (GLUCOTROL XL) 5 MG 24 hr tablet TAKE 1 TABLET EVERY DAY WITH BREAKFAST. 90 tablet 0  . INVOKANA 100 MG TABS tablet TAKE 1 TABLET ONCE DAILY. 90 tablet 0  . Lancets (FREESTYLE) lancets CHECK BLOOD SUGAR TWICE DAILY. 100 each 0  . metFORMIN (GLUCOPHAGE-XR) 500 MG 24 hr tablet TAKE 1 TABLET 3 TIMES DAILY WITH MEALS. 270 tablet 0  . Misc Natural Products (TART CHERRY ADVANCED PO) Take 1 capsule by mouth daily.    . montelukast (SINGULAIR) 10 MG tablet TAKE ONE TABLET AT BEDTIME. 90 tablet 0  . ONETOUCH VERIO test strip USE 1 TEST BLOOD SUGAR TWICE DAILY. 100 each 3  . oxybutynin (DITROPAN XL) 15 MG 24 hr tablet TAKE ONE TABLET AT BEDTIME. 90 tablet 0  . rifaximin (XIFAXAN) 550 MG TABS tablet TAKE 1 TABLET TWICE DAILY. PRN 60 tablet 3  . TOPROL XL 100 MG 24 hr tablet Take 1 tablet (100 mg total) by mouth 2 (two) times daily with a meal. 180 tablet 3  . zaleplon (SONATA) 5 MG capsule Take 5 mg by mouth at bedtime as needed for sleep.     No current facility-administered medications on file prior to visit.   Allergies  Allergen Reactions  . Fruit & Vegetable Daily [Nutritional Supplements] Diarrhea  .  Onion Other (See Comments)    indigestion  . Tree Extract Swelling  . Sulfa Drugs Cross Reactors Rash    All over the body   Family History  Problem Relation Age of Onset  . Heart disease Mother        AMI as cause of death age 16.  Marland Kitchen Heart disease Father        AMI as cause of death.  . Heart disease Brother     PE: BP 140/70   Pulse 78   Ht 5' 4.5" (1.638 m)   Wt 160 lb (72.6 kg)   SpO2 97%   BMI 27.04 kg/m  Body mass index is 27.04 kg/m. Wt Readings from Last 3 Encounters:  11/27/19 160 lb (72.6 kg)  09/30/19 159 lb 12.8 oz (72.5 kg)  08/12/19 160 lb (72.6 kg)   Constitutional: overweight, in NAD Eyes: PERRLA, EOMI, no exophthalmos ENT: moist mucous membranes, no thyromegaly, no cervical lymphadenopathy Cardiovascular: RRR, No MRG Respiratory: CTA B Gastrointestinal: abdomen soft, NT, ND, BS+ Musculoskeletal: no deformities, strength intact in all 4 Skin: moist, warm, no rashes Neurological: no tremor with outstretched hands, DTR normal in all 4  ASSESSMENT: 1. 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 HL  PLAN:  1. Patient with controlled diabetes, on oral antidiabetic regimen with Metformin, sulfonylurea, and SGLT 2 inhibitor, with good control.  Latest HbA1c was 6.7%, decreased from 6.9% at last visit.  We did not change her regimen at that time.  She was planning to return to the gym when this opened.  She was not checking sugars consistently and I strongly advised her to do  so. -Since last visit, her kidney function also continues to improve. -At this visit, she is not checking any blood sugars.  I strongly advised her to start. - we checked her HbA1c: 6.2% (lower) -Therefore, for now, we will continue the current regimen. - I advised her to: Patient Instructions  Please continue: - Invokana 100 mg in am - Metformin ER 500 mg 3x a day -  Glipizide XL 5 mg before b'fast  Also, continue: - Alpha lipoic acid 600 mg 2x a day.  Please come back for a follow-up appointment in 6 months with your sugar log.   - advised to check sugars at different times of the day - 1x a day, rotating check times - advised for yearly eye exams >> she is UTD - return to clinic in 6 months   2. PN -Continues on B vitamins -At last visit, numbness and tingling in her feet were worse so I advised her to restart alpha-lipoic acid.  She did not start it consistently, only taking it occasionally, as needed.  She again complains of numbness and tingling.  I again advised her to restart alpha-lipoic acid and take it consistently.  If this is not working, she may need stronger medications.  3. HL -Reviewed latest lipid panel from 12/2018: LDL at goal, HDL low, triglycerides high Lab Results  Component Value Date   CHOL 158 12/19/2018   HDL 36 (L) 12/19/2018   LDLCALC 68 12/19/2018   TRIG 272 (H) 12/19/2018   CHOLHDL 4.4 12/19/2018  -She is not on a statin but she continues omega-3 fatty acids.  [name], MD PhD [facility] Endocrinology

## 2019-11-27 NOTE — Patient Instructions (Signed)
Please continue: - Invokana 100 mg in am - Metformin ER 500 mg 3x a day - Glipizide XL 5 mg before b'fast  Please continue: - Alpha lipoic acid 600 mg 2x a day.  Start checking sugars 1x a day, rotating check times.  Please come back for a follow-up appointment in 6 months.

## 2019-11-28 DIAGNOSIS — G9389 Other specified disorders of brain: Secondary | ICD-10-CM | POA: Diagnosis not present

## 2019-11-28 DIAGNOSIS — K219 Gastro-esophageal reflux disease without esophagitis: Secondary | ICD-10-CM | POA: Diagnosis not present

## 2019-11-28 DIAGNOSIS — G4700 Insomnia, unspecified: Secondary | ICD-10-CM | POA: Diagnosis not present

## 2019-11-28 DIAGNOSIS — I10 Essential (primary) hypertension: Secondary | ICD-10-CM | POA: Diagnosis not present

## 2019-11-28 DIAGNOSIS — M5136 Other intervertebral disc degeneration, lumbar region: Secondary | ICD-10-CM | POA: Diagnosis not present

## 2019-11-28 DIAGNOSIS — R270 Ataxia, unspecified: Secondary | ICD-10-CM | POA: Diagnosis not present

## 2019-11-28 DIAGNOSIS — K589 Irritable bowel syndrome without diarrhea: Secondary | ICD-10-CM | POA: Diagnosis not present

## 2019-11-28 DIAGNOSIS — D689 Coagulation defect, unspecified: Secondary | ICD-10-CM | POA: Diagnosis not present

## 2019-11-28 DIAGNOSIS — E119 Type 2 diabetes mellitus without complications: Secondary | ICD-10-CM | POA: Diagnosis not present

## 2019-12-05 ENCOUNTER — Other Ambulatory Visit: Payer: Self-pay | Admitting: Family Medicine

## 2019-12-05 NOTE — Telephone Encounter (Signed)
Requested Prescriptions  Pending Prescriptions Disp Refills  . oxybutynin (DITROPAN XL) 15 MG 24 hr tablet [Pharmacy Med Name: OXYBUTYNIN CL ER 15 MG TABLET] 90 tablet 0    Sig: TAKE ONE TABLET AT BEDTIME.     Urology:  Bladder Agents Passed - 12/05/2019  1:07 PM      Passed - Valid encounter within last 12 months    Recent Outpatient Visits          2 months ago Cognitive and behavioral changes   [facility] Lilia Argue, MD   3 months ago Cognitive and behavioral changes   [facility] Lilia Argue, MD   11 months ago Essential hypertension   [facility], Lilia Argue, MD   11 months ago Controlled type 2 diabetes mellitus with diabetic nephropathy, without long-term current use of insulin [facility])   [facility], Lilia Argue, MD   1 year ago Controlled type 2 diabetes mellitus with diabetic nephropathy, without long-term current use of insulin [facility])   [facility], Lilia Argue, MD             . montelukast (SINGULAIR) 10 MG tablet [Pharmacy Med Name: MONTELUKAST SOD 10 MG TABLET] 90 tablet 0    Sig: TAKE ONE TABLET AT BEDTIME.     Pulmonology:  Leukotriene Inhibitors Passed - 12/05/2019  1:07 PM      Passed - Valid encounter within last 12 months    Recent Outpatient Visits          2 months ago Cognitive and behavioral changes   [facility] Lilia Argue, MD   3 months ago Cognitive and behavioral changes   [facility] Lilia Argue, MD   11 months ago Essential hypertension   [facility], Lilia Argue, MD   11 months ago Controlled type 2 diabetes mellitus with diabetic nephropathy, without long-term current use of insulin [facility])   [facility] Lilia Argue, MD   1 year ago Controlled type 2 diabetes mellitus with diabetic nephropathy, without long-term current use of insulin ([place])   [facility], [place], MD             .  TOPROL XL 100 MG 24 hr tablet [Pharmacy Med Name: TOPROL XL 100 MG TABLET] 180 tablet 0    Sig: TAKE 1 TABLET TWICE DAILY WITH FOOD.     Cardiovascular:  Beta Blockers Failed - 12/05/2019  1:07 PM      Failed - Last BP in normal range    BP Readings from Last 1 Encounters:  11/27/19 140/70         Passed - Last Heart Rate in normal range    Pulse Readings from Last 1 Encounters:  11/27/19 78         Passed - Valid encounter within last 6 months    Recent Outpatient Visits          2 months ago Cognitive and behavioral changes   [facility], Lilia Argue, MD   3 months ago Cognitive and behavioral changes   [facility] Lilia Argue, MD   11 months ago Essential hypertension   [facility] Lilia Argue, MD   11 months ago Controlled type 2 diabetes mellitus with diabetic nephropathy, without long-term current use of insulin ([place])  [facility] Lilia Argue, MD   1 year ago Controlled type 2 diabetes mellitus with diabetic nephropathy, without long-term current use of insulin [facility])   [facility], Lilia Argue, MD             . furosemide (LASIX) 20 MG tablet [Pharmacy Med Name: FUROSEMIDE 20 MG TABLET] 90 tablet 0    Sig: TAKE 1 OR 2 TABLETS DAILY AS DIRECTED.     Cardiovascular:  Diuretics - Loop Failed - 12/05/2019  1:07 PM      Failed - Cr in normal range and within 360 days    Creat  Date Value Ref Range Status  11/12/2015 1.25 (H) 0.60 - 0.93 mg/dL Final   Creatinine, Ser  Date Value Ref Range Status  08/12/2019 1.10 (H) 0.57 - 1.00 mg/dL Final         Failed - Last BP in normal range    BP Readings from Last 1 Encounters:  11/27/19 140/70         Passed - K in normal range and within 360 days    Potassium  Date Value Ref Range Status  08/12/2019 3.9 3.5 - 5.2 mmol/L Final         Passed - Ca in normal range and within 360 days    Calcium  Date Value Ref Range Status  08/12/2019 9.9  8.7 - 10.3 mg/dL Final         Passed - Na in normal range and within 360 days    Sodium  Date Value Ref Range Status  08/12/2019 140 134 - 144 mmol/L Final         Passed - Valid encounter within last 6 months    Recent Outpatient Visits          2 months ago Cognitive and behavioral changes   [facility] Lilia Argue, MD   3 months ago Cognitive and behavioral changes   [facility] Lilia Argue, MD   11 months ago Essential hypertension   [facility], Lilia Argue, MD   11 months ago Controlled type 2 diabetes mellitus with diabetic nephropathy, without long-term current use of insulin [facility])   [facility] Lilia Argue, MD   1 year ago Controlled type 2 diabetes mellitus with diabetic nephropathy, without long-term current use of insulin [facility])   [facility] Lilia Argue, MD

## 2019-12-05 NOTE — Telephone Encounter (Signed)
Patient is requesting a refill of the following medications: Requested Prescriptions   Pending Prescriptions Disp Refills   TOPROL XL 100 MG 24 hr tablet [Pharmacy Med Name: TOPROL XL 100 MG TABLET] 180 tablet 0    Sig: TAKE 1 TABLET TWICE DAILY WITH FOOD.   Signed Prescriptions Disp Refills   oxybutynin (DITROPAN XL) 15 MG 24 hr tablet 90 tablet 0    Sig: TAKE ONE TABLET AT BEDTIME.    Authorizing Provider: Pamella Pert, [place]    Ordering User: Sheran Spine K   montelukast (SINGULAIR) 10 MG tablet 90 tablet 0    Sig: TAKE ONE TABLET AT BEDTIME.    Authorizing Provider: Pamella Pert, [place]    Ordering User: Sheran Spine K   furosemide (LASIX) 20 MG tablet 90 tablet 0    Sig: TAKE 1 OR 2 TABLETS DAILY AS DIRECTED.    Authorizing Provider: Pamella Pert, [place] M    Ordering User: Addison Naegeli    Date of patient request: 12/05/2019 Last office visit: 08/12/2019    Video 09/16/2019 Date of last refill: 12/23/2018 Last refill amount: 180

## 2019-12-05 NOTE — Telephone Encounter (Signed)
Requested Prescriptions  Pending Prescriptions Disp Refills  . TOPROL XL 100 MG 24 hr tablet [Pharmacy Med Name: TOPROL XL 100 MG TABLET] 180 tablet 0    Sig: TAKE 1 TABLET TWICE DAILY WITH FOOD.     Cardiovascular:  Beta Blockers Failed - 12/05/2019  1:07 PM      Failed - Last BP in normal range    BP Readings from Last 1 Encounters:  11/27/19 140/70         Passed - Last Heart Rate in normal range    Pulse Readings from Last 1 Encounters:  11/27/19 78         Passed - Valid encounter within last 6 months    Recent Outpatient Visits          2 months ago Cognitive and behavioral changes   [facility] Lilia Argue, MD   3 months ago Cognitive and behavioral changes   [facility] Lilia Argue, MD   11 months ago Essential hypertension   [facility] Lilia Argue, MD   11 months ago Controlled type 2 diabetes mellitus with diabetic nephropathy, without long-term current use of insulin [facility])   [facility] Lilia Argue, MD   1 year ago Controlled type 2 diabetes mellitus with diabetic nephropathy, without long-term current use of insulin [facility])   [facility] Lilia Argue, MD             . furosemide (LASIX) 20 MG tablet [Pharmacy Med Name: FUROSEMIDE 20 MG TABLET] 90 tablet 0    Sig: TAKE 1 OR 2 TABLETS DAILY AS DIRECTED.     Cardiovascular:  Diuretics - Loop Failed - 12/05/2019  1:07 PM      Failed - Cr in normal range and within 360 days    Creat  Date Value Ref Range Status  11/12/2015 1.25 (H) 0.60 - 0.93 mg/dL Final   Creatinine, Ser  Date Value Ref Range Status  08/12/2019 1.10 (H) 0.57 - 1.00 mg/dL Final         Failed - Last BP in normal range    BP Readings from Last 1 Encounters:  11/27/19 140/70         Passed - K in normal range and within 360 days    Potassium  Date Value Ref Range Status  08/12/2019 3.9 3.5 - 5.2 mmol/L Final         Passed - Ca in normal range and within 360  days    Calcium  Date Value Ref Range Status  08/12/2019 9.9 8.7 - 10.3 mg/dL Final         Passed - Na in normal range and within 360 days    Sodium  Date Value Ref Range Status  08/12/2019 140 134 - 144 mmol/L Final         Passed - Valid encounter within last 6 months    Recent Outpatient Visits          2 months ago Cognitive and behavioral changes   [facility] Lilia Argue, MD   3 months ago Cognitive and behavioral changes   [facility] Lilia Argue, MD   11 months ago Essential hypertension   [facility] Lilia Argue, MD   11 months ago Controlled type 2 diabetes mellitus with diabetic nephropathy, without long-term current use of insulin [facility])   [facility], Lilia Argue, MD   1 year ago Controlled type 2 diabetes mellitus with diabetic nephropathy, without long-term current use of insulin [facility])   [facility] Lilia Argue, MD             Signed Prescriptions Disp Refills   oxybutynin (DITROPAN XL) 15 MG 24 hr tablet 90 tablet 0    Sig: TAKE ONE TABLET AT BEDTIME.     Urology:  Bladder Agents Passed - 12/05/2019  1:07 PM      Passed - Valid encounter within last 12 months    Recent Outpatient Visits          2 months ago Cognitive and behavioral changes   [facility] Lilia Argue, MD   3 months ago Cognitive and behavioral changes   [facility] Lilia Argue, MD   11 months ago Essential hypertension   [facility], Lilia Argue, MD   11 months ago Controlled type 2 diabetes mellitus with diabetic nephropathy, without long-term current use of insulin [facility])   [facility] Lilia Argue, MD   1 year ago Controlled type 2 diabetes mellitus with diabetic nephropathy, without long-term current use of insulin [facility])   [facility], Lilia Argue, MD              montelukast (SINGULAIR) 10 MG tablet 90 tablet 0    Sig:  TAKE ONE TABLET AT BEDTIME.     Pulmonology:  Leukotriene Inhibitors Passed - 12/05/2019  1:07 PM      Passed - Valid encounter within last 12 months    Recent Outpatient Visits          2 months ago Cognitive and behavioral changes   [facility] Lilia Argue, MD   3 months ago Cognitive and behavioral changes   [facility] Lilia Argue, MD   11 months ago Essential hypertension   [facility] Lilia Argue, MD   11 months ago Controlled type 2 diabetes mellitus with diabetic nephropathy, without long-term current use of insulin [facility])   [facility] Lilia Argue, MD   1 year ago Controlled type 2 diabetes mellitus with diabetic nephropathy, without long-term current use of insulin [facility])   [facility], Lilia Argue, MD

## 2019-12-05 NOTE — Telephone Encounter (Signed)
Requested medication (s) are due for refill today: yes  Requested medication (s) are on the active medication list: yes  Last refill:  09/10/19  Future visit scheduled: no  Notes to clinic: insurance requesting alternative medications    Requested Prescriptions  Pending Prescriptions Disp Refills   TOPROL XL 100 MG 24 hr tablet [Pharmacy Med Name: TOPROL XL 100 MG TABLET] 180 tablet 0    Sig: TAKE 1 TABLET TWICE DAILY WITH FOOD.      Cardiovascular:  Beta Blockers Failed - 12/05/2019  1:07 PM      Failed - Last BP in normal range    BP Readings from Last 1 Encounters:  11/27/19 140/70          Passed - Last Heart Rate in normal range    Pulse Readings from Last 1 Encounters:  11/27/19 78          Passed - Valid encounter within last 6 months    Recent Outpatient Visits           2 months ago Cognitive and behavioral changes   [facility] Lilia Argue, MD   3 months ago Cognitive and behavioral changes   [facility], Lilia Argue, MD   11 months ago Essential hypertension   [facility], Lilia Argue, MD   11 months ago Controlled type 2 diabetes mellitus with diabetic nephropathy, without long-term current use of insulin [facility])   [facility], Lilia Argue, MD   1 year ago Controlled type 2 diabetes mellitus with diabetic nephropathy, without long-term current use of insulin [facility])   [facility], Lilia Argue, MD               Signed Prescriptions Disp Refills   oxybutynin (DITROPAN XL) 15 MG 24 hr tablet 90 tablet 0    Sig: TAKE ONE TABLET AT BEDTIME.      Urology:  Bladder Agents Passed - 12/05/2019  1:07 PM      Passed - Valid encounter within last 12 months    Recent Outpatient Visits           2 months ago Cognitive and behavioral changes   [facility], Lilia Argue, MD   3 months ago Cognitive and behavioral changes   [facility], Lilia Argue, MD   11  months ago Essential hypertension   [facility], Lilia Argue, MD   11 months ago Controlled type 2 diabetes mellitus with diabetic nephropathy, without long-term current use of insulin [facility])   [facility], Lilia Argue, MD   1 year ago Controlled type 2 diabetes mellitus with diabetic nephropathy, without long-term current use of insulin [facility])   [facility], Lilia Argue, MD                montelukast (SINGULAIR) 10 MG tablet 90 tablet 0    Sig: TAKE ONE TABLET AT BEDTIME.      Pulmonology:  Leukotriene Inhibitors Passed - 12/05/2019  1:07 PM      Passed - Valid encounter within last 12 months    Recent Outpatient Visits           2 months ago Cognitive and behavioral changes   [facility], Lilia Argue, MD   3 months ago Cognitive and behavioral changes   [facility],  Lilia Argue, MD   11 months ago Essential hypertension   [facility], Lilia Argue, MD   11 months ago Controlled type 2 diabetes mellitus with diabetic nephropathy, without long-term current use of insulin [facility])   [facility], Lilia Argue, MD   1 year ago Controlled type 2 diabetes mellitus with diabetic nephropathy, without long-term current use of insulin [facility])   [facility], Lilia Argue, MD                furosemide (LASIX) 20 MG tablet 90 tablet 0    Sig: TAKE 1 OR 2 TABLETS DAILY AS DIRECTED.      Cardiovascular:  Diuretics - Loop Failed - 12/05/2019  1:07 PM      Failed - Cr in normal range and within 360 days    Creat  Date Value Ref Range Status  11/12/2015 1.25 (H) 0.60 - 0.93 mg/dL Final   Creatinine, Ser  Date Value Ref Range Status  08/12/2019 1.10 (H) 0.57 - 1.00 mg/dL Final          Failed - Last BP in normal range    BP Readings from Last 1 Encounters:  11/27/19 140/70          Passed - K in normal range and within 360 days    Potassium  Date Value Ref Range  Status  08/12/2019 3.9 3.5 - 5.2 mmol/L Final          Passed - Ca in normal range and within 360 days    Calcium  Date Value Ref Range Status  08/12/2019 9.9 8.7 - 10.3 mg/dL Final          Passed - Na in normal range and within 360 days    Sodium  Date Value Ref Range Status  08/12/2019 140 134 - 144 mmol/L Final          Passed - Valid encounter within last 6 months    Recent Outpatient Visits           2 months ago Cognitive and behavioral changes   [facility], Lilia Argue, MD   3 months ago Cognitive and behavioral changes   [facility], Lilia Argue, MD   11 months ago Essential hypertension   [facility], Lilia Argue, MD   11 months ago Controlled type 2 diabetes mellitus with diabetic nephropathy, without long-term current use of insulin [facility])   [facility], Lilia Argue, MD   1 year ago Controlled type 2 diabetes mellitus with diabetic nephropathy, without long-term current use of insulin [facility])   [facility], Lilia Argue, MD

## 2019-12-12 ENCOUNTER — Telehealth: Payer: Self-pay | Admitting: Family Medicine

## 2019-12-12 NOTE — Telephone Encounter (Signed)
Patient is calling to see if Dr. Pamella Pert would be willing to call in something for her sinus infection .  Please advise

## 2019-12-12 NOTE — Telephone Encounter (Addendum)
Pt is scheduled for provider at 5pm on Monday morning. Spoke to pt to confirm this appointment.  Pt is having trouble with her neuropathy and sleep issue.

## 2019-12-15 ENCOUNTER — Encounter: Payer: Self-pay | Admitting: Family Medicine

## 2019-12-15 ENCOUNTER — Telehealth: Payer: Medicare PPO | Admitting: Family Medicine

## 2019-12-15 ENCOUNTER — Telehealth: Payer: Self-pay | Admitting: Family Medicine

## 2019-12-15 ENCOUNTER — Other Ambulatory Visit: Payer: Self-pay

## 2019-12-15 VITALS — Ht 64.5 in | Wt 160.0 lb

## 2019-12-15 DIAGNOSIS — G629 Polyneuropathy, unspecified: Secondary | ICD-10-CM

## 2019-12-15 MED ORDER — GABAPENTIN 100 MG PO CAPS: ORAL_CAPSULE | ORAL | 1 refills | Status: DC

## 2019-12-15 NOTE — Progress Notes (Signed)
Virtual Visit Note  I connected with patient on 12/15/19 at 519pm by phone (per patient's preference) and verified that I am speaking with the correct person using two identifiers. [name] [name] is currently located at home and patient is currently with them during visit. The provider, Rutherford Guys, MD is located in their office at time of visit.  I discussed the limitations, risks, security and privacy concerns of performing an evaluation and management service by telephone and the availability of in person appointments. I also discussed with the patient that there may be a patient responsible charge related to this service. The patient expressed understanding and agreed to proceed.   I provided 12 minutes of non-face-to-face time during this encounter.  Chief Complaint  Patient presents with  . Headache    with nasal congestion for a few days  . Insomnia    per pt for weeks and neuropathy of hands and feet for weeks    HPI ? 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, memantine  Saw endo may 2021 - PN - b vitamins and alpha lipoic acid  Her main concern is her neuropathy from DM It has been getting worse over past several months worse at night Numbness and tingling and burning pain of both feet and hands She has been trying OTC supplements wo any results Has never been prescribed anything for it She does not see a foot doctor She is able to care for her toenails (her husband cuts them) She does not think she is having dementia She continues to struggle with insomnia She is having bad allergies but nothing that she is worried about or wants to discuss further  Allergies  Allergen Reactions  . Fruit & Vegetable Daily [Nutritional Supplements] Diarrhea  . Onion Other (See Comments)    indigestion  . Tree Extract Swelling  . Sulfa Drugs Cross Reactors Rash    All  over the body    Prior to Admission medications   Medication Sig Start Date End Date Taking? Authorizing Provider  aspirin 81 MG tablet Take 81 mg by mouth daily.   Yes [provider]  canagliflozin (INVOKANA) 100 MG TABS tablet Take 1 tablet (100 mg total) by mouth daily. 11/27/19  Yes [name], MD  fluticasone (FLONASE) 50 MCG/ACT nasal spray Place 2 sprays into both nostrils daily. 12/23/18  Yes Rutherford Guys, MD  furosemide (LASIX) 20 MG tablet TAKE 1 OR 2 TABLETS DAILY AS DIRECTED. 12/05/19  Yes Rutherford Guys, MD  glipiZIDE (GLUCOTROL XL) 5 MG 24 hr tablet TAKE 1 TABLET EVERY DAY WITH BREAKFAST. 11/27/19  Yes [name], MD  metFORMIN (GLUCOPHAGE-XR) 500 MG 24 hr tablet TAKE 1 TABLET 3 TIMES DAILY WITH MEALS. 11/27/19  Yes [name], MD  montelukast (SINGULAIR) 10 MG tablet TAKE ONE TABLET AT BEDTIME. 12/05/19  Yes Rutherford Guys, MD  oxybutynin (DITROPAN XL) 15 MG 24 hr tablet TAKE ONE TABLET AT BEDTIME. 12/05/19  Yes Rutherford Guys, MD  rifaximin (XIFAXAN) 550 MG TABS tablet TAKE 1 TABLET TWICE DAILY. PRN 01/20/19  Yes Rutherford Guys, MD  TOPROL XL 100 MG 24 hr tablet TAKE 1 TABLET TWICE DAILY WITH FOOD. 12/07/19  Yes Rutherford Guys, MD  Blood Glucose Monitoring Suppl (ONE TOUCH ULTRA 2) W/DEVICE KIT Inject 1 application as directed as needed. 08/13/14   [provider]  Lancets (  FREESTYLE) lancets CHECK BLOOD SUGAR TWICE DAILY. 01/01/18   [name], MD  Misc Natural Products (TART CHERRY ADVANCED PO) Take 1 capsule by mouth daily.    [provider]  ONETOUCH VERIO test strip USE 1 TEST BLOOD SUGAR TWICE DAILY. 01/01/18   [name], MD  zaleplon (SONATA) 5 MG capsule Take 5 mg by mouth at bedtime as needed for sleep.    [provider]    Past Medical History:  Diagnosis Date  . Allergy    [name]; allergy shots weekly.    . Anxiety   . Arthritis    DDD lumbar spine.    . Cancer ([place])    breast L x  2; skin basal cell carcinoma [name]).  . 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 Diabetes mellitus   . Diabetes mellitus without complication ([place])    Phreesia 12/12/2019  . Hypertension   . IBS (irritable bowel syndrome)    diarrhea predominant.    Past Surgical History:  Procedure Laterality Date  . BREAST LUMPECTOMY  1996   L breast cancer  . BREAST SURGERY N/A    Phreesia 12/12/2019  . EYE SURGERY N/A    Phreesia 12/12/2019  . MASTECTOMY  2002   Bilateral for breast cancer  . VAGINAL HYSTERECTOMY  1985   DUB; uterine fibroids; ovaries intact.    Social History   Tobacco Use  . Smoking status: Never Smoker  . Smokeless tobacco: Never Used  Substance Use Topics  . Alcohol use: No    Alcohol/week: 0.0 standard drinks    Comment: rare    Family History  Problem Relation Age of Onset  . Heart disease Mother        AMI as cause of death age 37.  Marland Kitchen Heart disease Father        AMI as cause of death.  . Heart disease Brother     ROS Per hpi  Objective  Vitals as reported by the patient: none   ASSESSMENT and PLAN  1. Peripheral polyneuropathy Trial of gabapentin, low dose, titrate as tolerated/to effect. Reviewed r/se/b. Might also help with sleep.  Other orders - gabapentin (NEURONTIN) 100 MG capsule; Take 1 capsule (100 mg total) by mouth at bedtime for 7 days, THEN 1 capsule (100 mg total) 2 (two) times daily for 23 days.  FOLLOW-UP: 4 weeks   The above assessment and management plan was discussed with the patient. The patient verbalized understanding of and has agreed to the management plan. Patient is aware to call the clinic if symptoms persist or worsen. Patient is aware when to return to the clinic for a follow-up visit. Patient educated on when it is appropriate to go to the emergency department.     Rutherford Guys, MD [facility] [place], [place] [address] Ph.  [phone] Fax  [phone]

## 2019-12-25 ENCOUNTER — Ambulatory Visit: Payer: Medicare PPO | Admitting: Family Medicine

## 2019-12-25 VITALS — BP 140/70 | Ht 64.5 in | Wt 160.0 lb

## 2019-12-25 DIAGNOSIS — Z0000 Encounter for general adult medical examination without abnormal findings: Secondary | ICD-10-CM

## 2019-12-25 NOTE — Patient Instructions (Addendum)
Thank you for taking time to come for your Medicare Wellness Visit. I appreciate your ongoing commitment to your health goals. Please review the following plan we discussed and let me know if I can assist you in the future.  Julie Greer LPN   Preventive Care 79 Years and Older, Female Preventive care refers to lifestyle choices and visits with your health care provider that can promote health and wellness. This includes:  A yearly physical exam. This is also called an annual well check.  Regular dental and eye exams.  Immunizations.  Screening for certain conditions.  Healthy lifestyle choices, such as diet and exercise. What can I expect for my preventive care visit? Physical exam Your health care provider will check:  Height and weight. These may be used to calculate body mass index (BMI), which is a measurement that tells if you are at a healthy weight.  Heart rate and blood pressure.  Your skin for abnormal spots. Counseling Your health care provider may ask you questions about:  Alcohol, tobacco, and drug use.  Emotional well-being.  Home and relationship well-being.  Sexual activity.  Eating habits.  History of falls.  Memory and ability to understand (cognition).  Work and work environment.  Pregnancy and menstrual history. What immunizations do I need?  Influenza (flu) vaccine  This is recommended every year. Tetanus, diphtheria, and pertussis (Tdap) vaccine  You may need a Td booster every 10 years. Varicella (chickenpox) vaccine  You may need this vaccine if you have not already been vaccinated. Zoster (shingles) vaccine  You may need this after age 60. Pneumococcal conjugate (PCV13) vaccine  One dose is recommended after age 65. Pneumococcal polysaccharide (PPSV23) vaccine  One dose is recommended after age 65. Measles, mumps, and rubella (MMR) vaccine  You may need at least one dose of MMR if you were born in 1957 or later. You may also  need a second dose. Meningococcal conjugate (MenACWY) vaccine  You may need this if you have certain conditions. Hepatitis A vaccine  You may need this if you have certain conditions or if you travel or work in places where you may be exposed to hepatitis A. Hepatitis B vaccine  You may need this if you have certain conditions or if you travel or work in places where you may be exposed to hepatitis B. Haemophilus influenzae type b (Hib) vaccine  You may need this if you have certain conditions. You may receive vaccines as individual doses or as more than one vaccine together in one shot (combination vaccines). Talk with your health care provider about the risks and benefits of combination vaccines. What tests do I need? Blood tests  Lipid and cholesterol levels. These may be checked every 5 years, or more frequently depending on your overall health.  Hepatitis C test.  Hepatitis B test. Screening  Lung cancer screening. You may have this screening every year starting at age 55 if you have a 30-pack-year history of smoking and currently smoke or have quit within the past 15 years.  Colorectal cancer screening. All adults should have this screening starting at age 50 and continuing until age 75. Your health care provider may recommend screening at age 45 if you are at increased risk. You will have tests every 1-10 years, depending on your results and the type of screening test.  Diabetes screening. This is done by checking your blood sugar (glucose) after you have not eaten for a while (fasting). You may have this done every   1-3 years.  Mammogram. This may be done every 1-2 years. Talk with your health care provider about how often you should have regular mammograms.  BRCA-related cancer screening. This may be done if you have a family history of breast, ovarian, tubal, or peritoneal cancers. Other tests  Sexually transmitted disease (STD) testing.  Bone density scan. This is done  to screen for osteoporosis. You may have this done starting at age 63. Follow these instructions at home: Eating and drinking  Eat a diet that includes fresh fruits and vegetables, whole grains, lean protein, and low-fat dairy products. Limit your intake of foods with high amounts of sugar, saturated fats, and salt.  Take vitamin and mineral supplements as recommended by your health care provider.  Do not drink alcohol if your health care provider tells you not to drink.  If you drink alcohol: ? Limit how much you have to 0-1 drink a day. ? Be aware of how much alcohol is in your drink. In the [place] one drink equals one 12 oz bottle of beer (355 mL), one 5 oz glass of wine (148 mL), or one 1 oz glass of hard liquor (44 mL). Lifestyle  Take daily care of your teeth and gums.  Stay active. Exercise for at least 30 minutes on 5 or more days each week.  Do not use any products that contain nicotine or tobacco, such as cigarettes, e-cigarettes, and chewing tobacco. If you need help quitting, ask your health care provider.  If you are sexually active, practice safe sex. Use a condom or other form of protection in order to prevent STIs (sexually transmitted infections).  Talk with your health care provider about taking a low-dose aspirin or statin. What's next?  Go to your health care provider once a year for a well check visit.  Ask your health care provider how often you should have your eyes and teeth checked.  Stay up to date on all vaccines. This information is not intended to replace advice given to you by your health care provider. Make sure you discuss any questions you have with your health care provider. Document Revised: 06/20/2018 Document Reviewed: 06/20/2018 Elsevier Patient Education  2020 [employer].

## 2019-12-25 NOTE — Progress Notes (Signed)
Presents today for [employer] Visit   Date of last exam: 12/15/2019  Interpreter used for this visit? No  I connected with  [name] on 12/25/19 by a telephone and verified that I am speaking with the correct person using two identifiers.   I discussed the limitations of evaluation and management by telemedicine. The patient expressed understanding and agreed to proceed.    Patient Care Team: Rutherford Guys, MD as PCP - General (Family Medicine) [name], MD (Ophthalmology) [name], MD as Consulting Physician (General Surgery) [name], MD as Consulting Physician (Internal Medicine)   Other items to address today:   Discussed Eye/Dental Discussed immuniztions   Other Screening: Last screening for diabetes: 08-12-2019 Last lipid screening: 08/12/2019  ADVANCE DIRECTIVES: Discussed: yes On File: yes Materials Provided: no  Immunization status:  Immunization History  Administered Date(s) Administered  . Influenza Split 03/19/2012, 03/19/2013  . Influenza, High Dose Seasonal PF 03/29/2018  . Influenza,inj,Quad PF,6+ Mos 04/09/2014, 03/22/2015, 03/24/2016  . Influenza-Unspecified 04/09/2017, 03/24/2018  . PFIZER SARS-COV-2 Vaccination 08/17/2019, 09/10/2019  . Pneumococcal Conjugate-13 11/05/2014  . Pneumococcal Polysaccharide-23 07/10/2004, 12/12/2010, 05/02/2016  . Td 07/10/2009  . Zoster 07/10/2006  . Zoster Recombinat (Shingrix) 04/09/2017, 07/19/2017     Health Maintenance Due  Topic Date Due  . Hepatitis C Screening  Never done  . OPHTHALMOLOGY EXAM  11/29/2017  . FOOT EXAM  05/14/2019  . URINE MICROALBUMIN  05/17/2019     Functional Status Survey: Is the patient deaf or have difficulty hearing?: No Does the patient have difficulty seeing, even when wearing glasses/contacts?: No Does the patient have difficulty concentrating, remembering, or making decisions?: No Does the patient have difficulty walking  or climbing stairs?: No Does the patient have difficulty dressing or bathing?: No Does the patient have difficulty doing errands alone such as visiting a doctor's office or shopping?: No   6CIT Screen 12/25/2019 05/11/2017  What Year? 0 points 0 points  What month? 0 points 0 points  What time? 0 points 0 points  Count back from 20 0 points 0 points  Months in reverse 0 points 0 points  Repeat phrase 0 points 0 points  Total Score 0 0        Clinical Support from 12/25/2019 in [facility] at [facility]  AUDIT-C Score 0       Home Environment:   Lives in 3 story home No trouble climbing stairs No scattered rugs Yes grab bars Adequate lighting/ no clutter   Patient Active Problem List   Diagnosis Date Noted  . Insomnia 12/23/2018  . Urge incontinence 05/09/2017  . Venous stasis 05/09/2017  . Peripheral polyneuropathy 05/22/2016  . Controlled diabetes mellitus with diabetic nephropathy, without long-term current use of insulin ([place]) 07/29/2015  . IBS (irritable bowel syndrome) 12/01/2014  . Vertical diplopia 11/17/2014  . Non-melanoma skin cancer 05/13/2014  . Seasonal allergies 09/12/2011  . Anxiety 09/12/2011  . HTN (hypertension) 09/12/2011  . Breast cancer ([place]) 09/12/2011  . GERD (gastroesophageal reflux disease) 09/12/2011  . Dyslipidemia 09/12/2011  . Osteopenia 09/12/2011  . DJD (degenerative joint disease) 09/12/2011  . Factor 5 Leiden mutation, heterozygous ([place]) 09/12/2011  . History of colonic polyps 09/12/2011     Past Medical History:  Diagnosis Date  . Allergy    [name]; allergy shots weekly.    . Anxiety   . Arthritis    DDD lumbar spine.    . Cancer ([place])    breast L  x 2; skin basal cell carcinoma [name]).  . 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 Diabetes mellitus   . Diabetes mellitus without complication ([place])    Phreesia 12/12/2019  . Hypertension   . IBS (irritable bowel syndrome)    diarrhea  predominant.     Past Surgical History:  Procedure Laterality Date  . BREAST LUMPECTOMY  1996   L breast cancer  . BREAST SURGERY N/A    Phreesia 12/12/2019  . EYE SURGERY N/A    Phreesia 12/12/2019  . MASTECTOMY  2002   Bilateral for breast cancer  . VAGINAL HYSTERECTOMY  1985   DUB; uterine fibroids; ovaries intact.     Family History  Problem Relation Age of Onset  . Heart disease Mother        AMI as cause of death age 89.  Marland Kitchen Heart disease Father        AMI as cause of death.  . Heart disease Brother      Social History   Socioeconomic History  . Marital status: Married    Spouse name: Richardson Landry  . Number of children: Not on file  . Years of education: Not on file  . Highest education level: Master's degree (e.g., MA, MS, MEng, MEd, MSW, MBA)  Occupational History  . Occupation: retired    Comment: retired  Tobacco Use  . Smoking status: Never Smoker  . Smokeless tobacco: Never Used  Substance and Sexual Activity  . Alcohol use: No    Alcohol/week: 0.0 standard drinks    Comment: rare  . Drug use: No  . Sexual activity: Never  Other Topics Concern  . 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.      Tobacco: never      Alcohol: rarely;       Exercise:  Silver Sneakers; Tai Chi once per week; exercises 3 days per week; balance class      ADLs: independent with ADLs; drives.  Husband does the cleaning and lifting groceries.      Advanced Directives: YES;  FULL CODE no prolonged measures.              Social Determinants of Health   Financial Resource Strain:   . Difficulty of Paying Living Expenses:   Food Insecurity:   . Worried About Charity fundraiser in the Last Year:   . Arboriculturist in the Last Year:    Transportation Needs:   . Film/video editor (Medical):   Marland Kitchen Lack of Transportation (Non-Medical):   Physical Activity:   . Days of Exercise per Week:   . Minutes of Exercise per Session:   Stress:   . Feeling of Stress :   Social Connections:   . Frequency of Communication with Friends and Family:   . Frequency of Social Gatherings with Friends and Family:   . Attends Religious Services:   . Active Member of Clubs or Organizations:   . Attends Archivist Meetings:   Marland Kitchen Marital Status:   Intimate Partner Violence:   . Fear of Current or Ex-Partner:   . Emotionally Abused:   Marland Kitchen Physically Abused:   .  Sexually Abused:      Allergies  Allergen Reactions  . Fruit & Vegetable Daily [Nutritional Supplements] Diarrhea  . Onion Other (See Comments)    indigestion  . Tree Extract Swelling  . Sulfa Drugs Cross Reactors Rash    All over the body     Prior to Admission medications   Medication Sig Start Date End Date Taking? Authorizing Provider  aspirin 81 MG tablet Take 81 mg by mouth daily.   Yes [provider]  Blood Glucose Monitoring Suppl (ONE TOUCH ULTRA 2) W/DEVICE KIT Inject 1 application as directed as needed. 08/13/14  Yes [provider]  canagliflozin (INVOKANA) 100 MG TABS tablet Take 1 tablet (100 mg total) by mouth daily. 11/27/19  Yes [name], MD  fluticasone (FLONASE) 50 MCG/ACT nasal spray Place 2 sprays into both nostrils daily. 12/23/18  Yes Rutherford Guys, MD  furosemide (LASIX) 20 MG tablet TAKE 1 OR 2 TABLETS DAILY AS DIRECTED. 12/05/19  Yes Rutherford Guys, MD  gabapentin (NEURONTIN) 100 MG capsule Take 1 capsule (100 mg total) by mouth at bedtime for 7 days, THEN 1 capsule (100 mg total) 2 (two) times daily for 23 days. 12/15/19 01/14/20 Yes Rutherford Guys, MD  glipiZIDE (GLUCOTROL XL) 5 MG 24 hr tablet TAKE 1 TABLET EVERY DAY WITH BREAKFAST. 11/27/19  Yes [name], MD  Lancets (FREESTYLE) lancets CHECK BLOOD  SUGAR TWICE DAILY. 01/01/18  Yes [name], MD  metFORMIN (GLUCOPHAGE-XR) 500 MG 24 hr tablet TAKE 1 TABLET 3 TIMES DAILY WITH MEALS. 11/27/19  Yes [name], MD  Misc Natural Products (TART CHERRY ADVANCED PO) Take 1 capsule by mouth daily.   Yes [provider]  montelukast (SINGULAIR) 10 MG tablet TAKE ONE TABLET AT BEDTIME. 12/05/19  Yes Rutherford Guys, MD  [facility] VERIO test strip USE 1 TEST BLOOD SUGAR TWICE DAILY. 01/01/18  Yes [name], MD  oxybutynin (DITROPAN XL) 15 MG 24 hr tablet TAKE ONE TABLET AT BEDTIME. 12/05/19  Yes Rutherford Guys, MD  rifaximin (XIFAXAN) 550 MG TABS tablet TAKE 1 TABLET TWICE DAILY. PRN 01/20/19  Yes Rutherford Guys, MD  TOPROL XL 100 MG 24 hr tablet TAKE 1 TABLET TWICE DAILY WITH FOOD. 12/07/19  Yes Rutherford Guys, MD     Depression screen [facility] 2/9 12/25/2019 12/15/2019 08/12/2019 12/23/2018 11/12/2017  Decreased Interest 0 0 0 0 0  Down, Depressed, Hopeless 0 0 0 0 0  PHQ - 2 Score 0 0 0 0 0     Fall Risk  12/25/2019 12/15/2019 09/30/2019 12/23/2018 11/12/2017  Falls in the past year? 0 0 1 0 No  Number falls in past yr: 0 - 0 0 -  Injury with Fall? 0 - 0 0 -  Risk for fall due to : - - - - -  Risk for fall due to: Comment - - - - -  Follow up Falls evaluation completed;Education provided Falls evaluation completed - - -      PHYSICAL EXAM: BP 140/70 Comment: taken from a previous visit  Ht 5' 4.5" (1.638 m)   Wt 160 lb (72.6 kg)   BMI 27.04 kg/m    Wt Readings from Last 3 Encounters:  12/25/19 160 lb (72.6 kg)  12/15/19 160 lb (72.6 kg)  11/27/19 160 lb (72.6 kg)    Medicare annual wellness visit, subsequent     Education/Counseling provided regarding diet and exercise, prevention of chronic diseases, smoking/tobacco cessation, if applicable, and reviewed "Covered  Medicare Preventive Services."

## 2020-01-30 ENCOUNTER — Encounter: Payer: Self-pay | Admitting: Family Medicine

## 2020-02-02 MED ORDER — GABAPENTIN 100 MG PO CAPS: 200.0000 mg | ORAL_CAPSULE | Freq: Every day | ORAL | 1 refills | Status: DC

## 2020-03-01 ENCOUNTER — Other Ambulatory Visit: Payer: Self-pay | Admitting: Family Medicine

## 2020-03-22 DIAGNOSIS — M9901 Segmental and somatic dysfunction of cervical region: Secondary | ICD-10-CM | POA: Diagnosis not present

## 2020-03-22 DIAGNOSIS — M5412 Radiculopathy, cervical region: Secondary | ICD-10-CM | POA: Diagnosis not present

## 2020-03-23 DIAGNOSIS — M5412 Radiculopathy, cervical region: Secondary | ICD-10-CM | POA: Diagnosis not present

## 2020-03-23 DIAGNOSIS — M9901 Segmental and somatic dysfunction of cervical region: Secondary | ICD-10-CM | POA: Diagnosis not present

## 2020-03-30 DIAGNOSIS — M9901 Segmental and somatic dysfunction of cervical region: Secondary | ICD-10-CM | POA: Diagnosis not present

## 2020-03-30 DIAGNOSIS — M5412 Radiculopathy, cervical region: Secondary | ICD-10-CM | POA: Diagnosis not present

## 2020-04-06 DIAGNOSIS — M9901 Segmental and somatic dysfunction of cervical region: Secondary | ICD-10-CM | POA: Diagnosis not present

## 2020-04-06 DIAGNOSIS — M5412 Radiculopathy, cervical region: Secondary | ICD-10-CM | POA: Diagnosis not present

## 2020-04-12 DIAGNOSIS — M5412 Radiculopathy, cervical region: Secondary | ICD-10-CM | POA: Diagnosis not present

## 2020-04-12 DIAGNOSIS — M9901 Segmental and somatic dysfunction of cervical region: Secondary | ICD-10-CM | POA: Diagnosis not present

## 2020-05-27 ENCOUNTER — Ambulatory Visit: Payer: Medicare PPO | Admitting: Internal Medicine

## 2020-07-19 ENCOUNTER — Encounter: Payer: Self-pay | Admitting: Internal Medicine

## 2020-07-19 ENCOUNTER — Ambulatory Visit: Payer: Medicare PPO | Admitting: Internal Medicine

## 2020-07-19 ENCOUNTER — Other Ambulatory Visit: Payer: Self-pay

## 2020-07-19 VITALS — BP 110/70 | HR 74 | Ht 64.0 in | Wt 158.2 lb

## 2020-07-19 DIAGNOSIS — E1121 Type 2 diabetes mellitus with diabetic nephropathy: Secondary | ICD-10-CM | POA: Diagnosis not present

## 2020-07-19 DIAGNOSIS — E785 Hyperlipidemia, unspecified: Secondary | ICD-10-CM | POA: Diagnosis not present

## 2020-07-19 DIAGNOSIS — G629 Polyneuropathy, unspecified: Secondary | ICD-10-CM

## 2020-07-19 NOTE — Patient Instructions (Addendum)
Please continue: - Invokana 100 mg in am - Metformin ER 500 mg 3x a day - Glipizide XL 5 mg before b'fast  Please come back for a follow-up appointment in 6 months.

## 2020-07-19 NOTE — Progress Notes (Signed)
Patient ID: [name] [name], female   DOB: 12-Jan-1941, 80 y.o.   MRN: [id number]  This visit occurred during the SARS-CoV-2 public health emergency.  Safety protocols were in place, including screening questions prior to the visit, additional usage of staff PPE, and extensive cleaning of exam room while observing appropriate contact time as indicated for disinfecting solutions.   HPI: KENDELLE [name] is a 80 y.o.-year-old female, returning for follow-up for DM2, dx 2014, non-insulin-dependent, now controlled, with complications (mild CKD, PN). Last visit 8 mo ago.  She is here with her husband.  He is the one who organizes her medicines.  She fell 2 weeks ago << imbalance. No fractures.  She walks with a wheeled walker.  Reviewed HbA1c levels: 05/27/2020: HbA1c 6.7% Lab Results  Component Value Date   HGBA1C 6.2 (A) 11/27/2019   HGBA1C 6.7 (H) 08/12/2019   HGBA1C 6.9 (H) 12/19/2018   She continues on: - Invokana 100 mg in am - Metformin ER 500 mg 3x a day >> 1500 mg with dinner - Glipizide XL 5 mg before b'fast  She is still not checking sugars.  Previously: - A.m.: 120s,160 >> 123-160 >> 112, 133 x2, 165 (forgot meds) - 2 hours post breakfast: 96, 139 >> n/c - Before lunch:  95-144 >> 126-133 >> n/c  - 2 hours post lunch:  95, 131, 187 >> 83 >> n/c - before dinner:  n/c >> 93, 111 >> 79-153 >> n/c - 2 hours post dinner: 101, 103 >> n/c >> 140 >> n/c - Bedtime:  116-169 >> n/c >> 102 >> n/c Lowest: 79 >> .Marland Kitchen.123 >> 112 >> ? Highest: 160 >> 165 >> ?.  Pt's meals are: - Breakfast: apple + cottage cheese + whole wheat - Lunch: leftovers from dinner; sandwich; salad - Dinner: varies; some fast food - Snacks: veggies; chips Drinks diet sodas  Before the coronavirus pandemic, she was exercising consistently: Silver sneakers at the [facility] 3 times a week, tai chi once a week.  However, she stopped exercising after the facility is closed.  -+ CKD, last BUN/creatinine:  05/27/2020:  21/1.17, GFR 45, ACR 66.8 Lab Results  Component Value Date   BUN 21 08/12/2019   CREATININE 1.10 (H) 08/12/2019  Not on ACE inhibitor/ARB. -+ HL; last set of lipids: Lab Results  Component Value Date   CHOL 158 12/19/2018   HDL 36 (L) 12/19/2018   LDLCALC 68 12/19/2018   TRIG 272 (H) 12/19/2018   CHOLHDL 4.4 12/19/2018  Not on a statin, but she takes omega-3 fatty acids. - last eye exam was in 2021: No DR reportedly (Dr. Katy Fitch); + history of cataract surgery -+ Numbness and tingling in her feet.  On B complex.  She was alpha-lipoic acid intermittently  at last visit andI advised her to take it consistently   -she does take this now. She used Lidocaine patches >> discontinued.  We started PN cream , but this was expensive (93$). Voltaren gel works a little better. Tried Neurontin >> did not like it >> restarted by PCP- recently increased. She read about possible side effects from Lyrica >> would not want to try it.  She had stool incontinence >> better after switching to metformin ER.  She is a friend of Dr. Milta Deiters.  ROS: Constitutional: no weight gain/no weight loss, no fatigue, no subjective hyperthermia, no subjective hypothermia Eyes: no blurry vision, no xerophthalmia ENT: no sore throat, no nodules palpated in neck, no dysphagia, no odynophagia, no hoarseness  Cardiovascular: no CP/no SOB/no palpitations/no leg swelling Respiratory: no cough/no SOB/no wheezing Gastrointestinal: no N/no V/no D/no C/no acid reflux Musculoskeletal: no muscle aches/no joint aches Skin: no rashes, no hair loss Neurological: no tremors/+ numbness/+ tingling/no dizziness, + disequilibrium per husband  I reviewed pt's medications, allergies, PMH, social hx, family hx, and changes were documented in the history of present illness. Otherwise, unchanged from my initial visit note.  Past Medical History:  Diagnosis Date  . Allergy    [name]; allergy shots weekly.    . Anxiety   .  Arthritis    DDD lumbar spine.    . Cancer ([place])    breast L x 2; skin basal cell carcinoma [name]).  . 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 Diabetes mellitus   . Diabetes mellitus without complication ([place])    Phreesia 12/12/2019  . Hypertension   . IBS (irritable bowel syndrome)    diarrhea predominant.   Past Surgical History:  Procedure Laterality Date  . BREAST LUMPECTOMY  1996   L breast cancer  . BREAST SURGERY N/A    Phreesia 12/12/2019  . EYE SURGERY N/A    Phreesia 12/12/2019  . MASTECTOMY  2002   Bilateral for breast cancer  . VAGINAL HYSTERECTOMY  1985   DUB; uterine fibroids; ovaries intact.   Social History   Socioeconomic History  . Marital status: Married    Spouse name: Richardson Landry  . Number of children: Not on file  . Years of education: Not on file  . Highest education level: Master's degree (e.g., MA, MS, MEng, MEd, MSW, MBA)  Occupational History  . Occupation: retired    Comment: retired  Tobacco Use  . Smoking status: Never Smoker  . Smokeless tobacco: Never Used  Substance and Sexual Activity  . Alcohol use: No    Alcohol/week: 0.0 standard drinks    Comment: rare  . Drug use: No  . Sexual activity: Never  Other Topics Concern  . 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.      Tobacco: never      Alcohol: rarely;       Exercise:  Silver Sneakers; Tai Chi once per week; exercises 3 days per week; balance class      ADLs: independent with ADLs; drives.  Husband does the cleaning and lifting groceries.      Advanced Directives: YES;  FULL CODE no prolonged measures.              Social Determinants of Health   Financial Resource Strain: Not on file  Food  Insecurity: Not on file  Transportation Needs: Not on file  Physical Activity: Not on file  Stress: Not on file  Social Connections: Not on file  Intimate Partner Violence: Not on file   Current Outpatient Medications on File Prior to Visit  Medication Sig Dispense Refill  . aspirin 81 MG tablet Take 81 mg by mouth daily.    . Blood Glucose Monitoring Suppl (ONE TOUCH ULTRA 2) W/DEVICE KIT Inject 1 application as directed as needed.    . canagliflozin (INVOKANA) 100 MG TABS tablet Take 1 tablet (100 mg total) by  mouth daily. 90 tablet 3  . fluticasone (FLONASE) 50 MCG/ACT nasal spray Place 2 sprays into both nostrils daily. 48 g 3  . furosemide (LASIX) 20 MG tablet TAKE 1 OR 2 TABLETS DAILY AS DIRECTED. 90 tablet 0  . gabapentin (NEURONTIN) 100 MG capsule Take 2 capsules (200 mg total) by mouth at bedtime. 180 capsule 1  . glipiZIDE (GLUCOTROL XL) 5 MG 24 hr tablet TAKE 1 TABLET EVERY DAY WITH BREAKFAST. 90 tablet 3  . Lancets (FREESTYLE) lancets CHECK BLOOD SUGAR TWICE DAILY. 100 each 0  . metFORMIN (GLUCOPHAGE-XR) 500 MG 24 hr tablet TAKE 1 TABLET 3 TIMES DAILY WITH MEALS. 270 tablet 3  . Misc Natural Products (TART CHERRY ADVANCED PO) Take 1 capsule by mouth daily.    . montelukast (SINGULAIR) 10 MG tablet TAKE ONE TABLET AT BEDTIME. 90 tablet 0  . ONETOUCH VERIO test strip USE 1 TEST BLOOD SUGAR TWICE DAILY. 100 each 3  . oxybutynin (DITROPAN XL) 15 MG 24 hr tablet TAKE ONE TABLET AT BEDTIME. 90 tablet 0  . rifaximin (XIFAXAN) 550 MG TABS tablet TAKE 1 TABLET TWICE DAILY. PRN 60 tablet 3  . TOPROL XL 100 MG 24 hr tablet TAKE 1 TABLET TWICE DAILY WITH FOOD. 180 tablet 0   No current facility-administered medications on file prior to visit.   Allergies  Allergen Reactions  . Fruit & Vegetable Daily [Nutritional Supplements] Diarrhea  . Onion Other (See Comments)    indigestion  . Tree Extract Swelling  . Sulfa Drugs Cross Reactors Rash    All over the body   Family History   Problem Relation Age of Onset  . Heart disease Mother        AMI as cause of death age 94.  Marland Kitchen Heart disease Father        AMI as cause of death.  . Heart disease Brother     PE: BP 110/70   Pulse 74   Ht '5\' 4"'  (1.626 m)   Wt 158 lb 3.2 oz (71.8 kg)   SpO2 97%   BMI 27.15 kg/m  Body mass index is 27.15 kg/m. Wt Readings from Last 3 Encounters:  07/19/20 158 lb 3.2 oz (71.8 kg)  12/25/19 160 lb (72.6 kg)  12/15/19 160 lb (72.6 kg)   Constitutional: overweight, in NAD Eyes: PERRLA, EOMI, no exophthalmos ENT: moist mucous membranes, no thyromegaly, no cervical lymphadenopathy Cardiovascular: RRR, No MRG Respiratory: CTA B Gastrointestinal: abdomen soft, NT, ND, BS+ Musculoskeletal: no deformities, strength intact in all 4 Skin: moist, warm, no rashes Neurological: no tremor with outstretched hands, DTR normal in all 4  ASSESSMENT: 1. 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 HL  PLAN:  1. Patient with controlled diabetes, on oral antidiabetic regimen with metformin, sulfonylurea, and SGLT2 inhibitor, with good control.   -Latest kidney function improved -At last visit, HbA1c was lower, at 6.2%, but she was not checking sugars to detect trends.  I strongly advised her to start, but we did not change her regimen. -At today's visit, she is still not checking blood sugars.  Also, she had a recent HbA1c less than 2 months ago so we cannot check another A1c today.  Therefore, I do not have any indication about her diabetes at today's visit, other than the lack of symptoms.  I discussed with patient and her husband that I would  absolutely need to have some blood sugars to evaluate her control and I advised him that at least 2 weeks prior to our next visit to try to check some sugars at different times of the day.  Discussed about target for before and after meal blood  sugars.  In the meantime, we will continue the same regimen. - I advised her to: Patient Instructions  Please continue: - Invokana 100 mg in am - Metformin ER 500 mg 3x a day - Glipizide XL 5 mg before b'fast  Please come back for a follow-up appointment in 6 months.  - advised to check sugars at different times of the day - 1x a day, rotating check times - advised for yearly eye exams >> she is UTD - return to clinic in 6 months  2. PN -Continues on B vitamins and at last visit I again advised her to restart alpha-lipoic acid and take it consistently -as she was only taking it as needed.  She does take this consistently and is also using Neurontin per PCP.  This was recently increased. -She continues to have numbness and tingling in her feet  3. HL -Reviewed latest lipid panel from 12/2018: LDL is at goal, HDL is low, triglycerides are high: Lab Results  Component Value Date   CHOL 158 12/19/2018   HDL 36 (L) 12/19/2018   LDLCALC 68 12/19/2018   TRIG 272 (H) 12/19/2018   CHOLHDL 4.4 12/19/2018  -She is not on a statin but she continues on omega-3 fatty acids -She is due for another lipid panel.  She has an appointment with PCP in 2 months.  [name], MD PhD [facility] Endocrinology

## 2020-09-18 ENCOUNTER — Encounter (HOSPITAL_COMMUNITY): Payer: Self-pay

## 2020-09-18 ENCOUNTER — Ambulatory Visit (HOSPITAL_COMMUNITY)
Admission: EM | Admit: 2020-09-18 | Discharge: 2020-09-18 | Disposition: A | Discharge status: Home / Self Care | Payer: Medicare PPO | Attending: Family Medicine | Admitting: Family Medicine

## 2020-09-18 ENCOUNTER — Other Ambulatory Visit: Payer: Self-pay

## 2020-09-18 DIAGNOSIS — B351 Tinea unguium: Secondary | ICD-10-CM | POA: Diagnosis not present

## 2020-09-18 DIAGNOSIS — M79674 Pain in right toe(s): Secondary | ICD-10-CM | POA: Diagnosis not present

## 2020-09-18 MED ORDER — AMOXICILLIN-POT CLAVULANATE 875-125 MG PO TABS: 1.0000 | ORAL_TABLET | Freq: Two times a day (BID) | ORAL | 0 refills | Status: DC

## 2020-09-18 MED ORDER — KETOCONAZOLE 2 % EX CREA: 1.0000 "application " | TOPICAL_CREAM | Freq: Two times a day (BID) | CUTANEOUS | 2 refills | Status: DC

## 2020-09-18 NOTE — Discharge Instructions (Addendum)
Follow-up with primary care next week for recheck

## 2020-09-18 NOTE — ED Provider Notes (Signed)
[place]    CSN: [id number] Arrival date & time: 09/18/20  1214      History   Chief Complaint Chief Complaint  Patient presents with  . Foot Pain    Right foot/big toe red    HPI [name] [name] is a 80 y.o. female.   Patient here today with right great toe redness and bleeding x1 day.  She states she did not notice any issues prior to yesterday and has not injured the toe in any way.  Has a history of neuropathy in her feet and does not feel any pain associated with the area.  Has not tried anything for symptoms at this time.  Denies fever, chills, thick drainage from the area.     Past Medical History:  Diagnosis Date  . Allergy    [name]; allergy shots weekly.    . Anxiety   . Arthritis    DDD lumbar spine.    . Cancer ([place])    breast L x 2; skin basal cell carcinoma [name]).  . 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 Diabetes mellitus   . Diabetes mellitus without complication ([place])    Phreesia 12/12/2019  . Hypertension   . IBS (irritable bowel syndrome)    diarrhea predominant.    Patient Active Problem List   Diagnosis Date Noted  . Insomnia 12/23/2018  . Urge incontinence 05/09/2017  . Venous stasis 05/09/2017  . Peripheral polyneuropathy 05/22/2016  . Controlled diabetes mellitus with diabetic nephropathy, without long-term current use of insulin ([place]) 07/29/2015  . IBS (irritable bowel syndrome) 12/01/2014  . Vertical diplopia 11/17/2014  . Non-melanoma skin cancer 05/13/2014  . Seasonal allergies 09/12/2011  . Anxiety 09/12/2011  . HTN (hypertension) 09/12/2011  . Breast cancer ([place]) 09/12/2011  . GERD (gastroesophageal reflux disease) 09/12/2011  . Dyslipidemia 09/12/2011  . Osteopenia 09/12/2011  . DJD (degenerative joint disease) 09/12/2011  . Factor 5 Leiden mutation, heterozygous ([place]) 09/12/2011  . History of colonic polyps 09/12/2011    Past Surgical History:  Procedure  Laterality Date  . BREAST LUMPECTOMY  1996   L breast cancer  . BREAST SURGERY N/A    Phreesia 12/12/2019  . EYE SURGERY N/A    Phreesia 12/12/2019  . MASTECTOMY  2002   Bilateral for breast cancer  . VAGINAL HYSTERECTOMY  1985   DUB; uterine fibroids; ovaries intact.    OB History   No obstetric history on file.      Home Medications    Prior to Admission medications   Medication Sig Start Date End Date Taking? Authorizing Provider  amoxicillin-clavulanate (AUGMENTIN) 875-125 MG tablet Take 1 tablet by mouth every 12 (twelve) hours. 09/18/20  Yes Volney American, PA-C  ketoconazole (NIZORAL) 2 % cream Apply 1 application topically 2 (two) times daily. 09/18/20  Yes Volney American, PA-C  aspirin 81 MG tablet Take 81 mg by mouth daily.    [provider]  Blood Glucose Monitoring Suppl (ONE TOUCH ULTRA 2) W/DEVICE KIT Inject 1 application as directed as needed. 08/13/14   [provider]  canagliflozin (INVOKANA) 100 MG TABS tablet Take 1 tablet (100 mg total) by mouth daily. 11/27/19   [name], MD  Coenzyme Q10 100 MG capsule Take by mouth.    [provider]  cyanocobalamin 100 MCG tablet Take by mouth.    [provider]  fexofenadine (ALLEGRA) 180 MG tablet     [provider]  fluticasone (FLONASE) 50 MCG/ACT nasal spray Place 2 sprays into both nostrils daily. 12/23/18   Jacelyn Pi, Lilia Argue, MD  furosemide (LASIX) 20 MG tablet TAKE 1 OR 2 TABLETS DAILY AS DIRECTED. 12/05/19   Jacelyn Pi, Lilia Argue, MD  gabapentin (NEURONTIN) 100 MG capsule Take 2 capsules (200 mg total) by mouth at bedtime. 02/02/20   Jacelyn Pi, Lilia Argue, MD  glipiZIDE (GLUCOTROL XL) 5 MG 24 hr tablet TAKE 1 TABLET EVERY DAY WITH BREAKFAST. 11/27/19   [name], MD  Lancets (FREESTYLE) lancets CHECK BLOOD SUGAR TWICE DAILY. 01/01/18   [name], MD  metFORMIN (GLUCOPHAGE-XR) 500 MG 24 hr tablet TAKE 1 TABLET 3 TIMES DAILY WITH  MEALS. 11/27/19   [name], MD  Misc Natural Products (TART CHERRY ADVANCED PO) Take 1 capsule by mouth daily.    [provider]  montelukast (SINGULAIR) 10 MG tablet TAKE ONE TABLET AT BEDTIME. 03/01/20   Jacelyn Pi, Lilia Argue, MD  [facility] VERIO test strip USE 1 TEST BLOOD SUGAR TWICE DAILY. 01/01/18   [name], MD  oxybutynin (DITROPAN XL) 15 MG 24 hr tablet TAKE ONE TABLET AT BEDTIME. 03/01/20   Jacelyn Pi, Lilia Argue, MD  rifaximin (XIFAXAN) 550 MG TABS tablet TAKE 1 TABLET TWICE DAILY. PRN 01/20/19   Jacelyn Pi, Lilia Argue, MD  TOPROL XL 100 MG 24 hr tablet TAKE 1 TABLET TWICE DAILY WITH FOOD. 12/07/19   Daleen Squibb, MD    Family History Family History  Problem Relation Age of Onset  . Heart disease Mother        AMI as cause of death age 73.  Marland Kitchen Heart disease Father        AMI as cause of death.  . Heart disease Brother     Social History Social History   Tobacco Use  . Smoking status: Never Smoker  . Smokeless tobacco: Never Used  Substance Use Topics  . Alcohol use: No    Alcohol/week: 0.0 standard drinks    Comment: rare  . Drug use: No     Allergies   Fruit & vegetable daily [nutritional supplements], Onion, Tree extract, and Sulfa drugs cross reactors   Review of Systems Review of Systems Per HPI  Physical Exam Triage Vital Signs ED Triage Vitals  Enc Vitals Group     BP 09/18/20 1236 (!) 156/66     Pulse Rate 09/18/20 1236 73     Resp 09/18/20 1236 16     Temp 09/18/20 1236 97.8 F (36.6 C)     Temp Source 09/18/20 1236 Oral     SpO2 09/18/20 1236 98 %     Weight --      Height --      Head Circumference --      Peak Flow --      Pain Score 09/18/20 1235 0     Pain Loc --      Pain Edu? --      Excl. in [place]? --    No data found.  Updated Vital Signs BP (!) 156/66 (BP Location: Right Arm)   Pulse 73   Temp 97.8 F (36.6 C) (Oral)   Resp 16   SpO2 98%   Visual Acuity Right Eye Distance:   Left Eye  Distance:   Bilateral Distance:    Right Eye Near:   Left Eye Near:    Bilateral Near:     Physical Exam Vitals and nursing note reviewed.  Constitutional:      Appearance: Normal appearance. She is not ill-appearing.  HENT:     Head: Atraumatic.  Eyes:     Extraocular Movements: Extraocular movements intact.     Conjunctiva/sclera: Conjunctivae normal.  Cardiovascular:     Rate and Rhythm: Normal rate and regular rhythm.     Heart sounds: Normal heart sounds.  Pulmonary:     Effort: Pulmonary effort is normal.     Breath sounds: Normal breath sounds.  Musculoskeletal:        General: Normal range of motion.     Cervical back: Normal range of motion and neck supple.     Comments: Good range of motion right great toe and entire foot No significant swelling noted to the great toe  Skin:    General: Skin is warm and dry.     Comments: Right great toenail thickened, loose particularly distally and dried blood present around nailbed.  Erythematous surrounding area distal right great toe nontender to palpation, no active drainage  Neurological:     Mental Status: She is alert and oriented to person, place, and time.     Sensory: No sensory deficit.     Comments: Minimal sensation to light touch or pressure right foot which she states is baseline for her  Psychiatric:        Mood and Affect: Mood normal.        Thought Content: Thought content normal.        Judgment: Judgment normal.     UC Treatments / Results  Labs (all labs ordered are listed, but only abnormal results are displayed) Labs Reviewed - No data to display  EKG   Radiology No results found.  Procedures Procedures (including critical care time)  Medications Ordered in UC Medications - No data to display  Initial Impression / Assessment and Plan / UC Course  I have reviewed the triage vital signs and the nursing notes.  Pertinent labs & imaging results that were available during my care of the  patient were reviewed by me and considered in my medical decision making (see chart for details).     Unclear when issue truly started as she has significant neuropathy to the foot with minimal sensation to pain.  Does not appear to be a brand-new issue based on the extent of the nail changes.  Suspect fungal infection to the nail now cellulitis to distal right toe.  We will treat with ketoconazole twice daily topically for 6 to 12 months as needed and course of Augmentin.  Discussed warm Epsom salt soaks, leg elevation.  Close follow-up with PCP for recheck recommended next week.  Final Clinical Impressions(s) / UC Diagnoses   Final diagnoses:  Great toe pain, right  Onychomycosis     Discharge Instructions     Follow-up with primary care next week for recheck    ED Prescriptions    Medication Sig Dispense Auth. Provider   amoxicillin-clavulanate (AUGMENTIN) 875-125 MG tablet Take 1 tablet by mouth every 12 (twelve) hours. 14 tablet Volney American, [place]   ketoconazole (NIZORAL) 2 % cream Apply 1 application topically 2 (two) times daily. 100 g Volney American, [place]     PDMP not reviewed this encounter.   Volney American, [place] 09/18/20 1332

## 2020-09-18 NOTE — ED Notes (Signed)
Pt placed in armed chair in exam room due to limited mobility.

## 2020-09-18 NOTE — ED Triage Notes (Signed)
Pt present right foot/big toe redness with some swelling. Pt states that she noticed the redness last night. Pt denies any injury to her foot/toe

## 2020-11-22 ENCOUNTER — Other Ambulatory Visit: Payer: Self-pay | Admitting: Internal Medicine

## 2020-11-29 ENCOUNTER — Other Ambulatory Visit: Payer: Self-pay | Admitting: Internal Medicine

## 2021-01-18 ENCOUNTER — Ambulatory Visit: Payer: Medicare PPO | Admitting: Internal Medicine

## 2021-01-18 ENCOUNTER — Other Ambulatory Visit: Payer: Self-pay

## 2021-01-18 ENCOUNTER — Encounter: Payer: Self-pay | Admitting: Internal Medicine

## 2021-01-18 VITALS — BP 128/77 | HR 78 | Ht 64.0 in | Wt 159.2 lb

## 2021-01-18 DIAGNOSIS — G629 Polyneuropathy, unspecified: Secondary | ICD-10-CM

## 2021-01-18 DIAGNOSIS — E785 Hyperlipidemia, unspecified: Secondary | ICD-10-CM | POA: Diagnosis not present

## 2021-01-18 DIAGNOSIS — E1121 Type 2 diabetes mellitus with diabetic nephropathy: Secondary | ICD-10-CM

## 2021-01-18 MED ORDER — FREESTYLE LIBRE 2 SENSOR MISC: 1.0000 | 3 refills | Status: DC

## 2021-01-18 MED ORDER — FREESTYLE LIBRE 2 READER DEVI: 1.0000 | Freq: Every day | 0 refills | Status: DC

## 2021-01-18 NOTE — Progress Notes (Signed)
Patient ID: [name] [name], female   DOB: 1940-09-07, 80 y.o.   MRN: [id number]  This visit occurred during the SARS-CoV-2 public health emergency.  Safety protocols were in place, including screening questions prior to the visit, additional usage of staff PPE, and extensive cleaning of exam room while observing appropriate contact time as indicated for disinfecting solutions.   HPI: [name] [name] is a 80 y.o.-year-old female, returning for follow-up for DM2, dx 2014, non-insulin-dependent, now controlled, with complications (mild CKD, PN). Last visit 8 mo ago.  She is here with her husband.  He is the one who organizes her medicines.  Interim hx: She has increased urination. Changed from Ditropan to Detrol. No blurry vision, nausea, chest pain. She is having Home Health coming to her house 3x a week.  Even during this visit, she refuses fingersticks.  Reviewed HbA1c levels: Lab Results  Component Value Date   HGBA1C 6.2 (A) 11/27/2019   HGBA1C 6.7 (H) 08/12/2019   HGBA1C 6.9 (H) 12/19/2018  05/27/2020: HbA1c 6.7%  She continues on: - Invokana 100 mg in am - Metformin ER 500 mg 3x a day >> 1500 mg with dinner >> 500 mg in am and 1000 mg in pm - Glipizide XL 5 mg before b'fast  She is still not checking sugars.  Previously: - A.m.: 120s,160 >> 123-160 >> 112, 133 x2, 165 (forgot meds) - 2 hours post breakfast: 96, 139 >> n/c - Before lunch:  95-144 >> 126-133 >> n/c  - 2 hours post lunch:  95, 131, 187 >> 83 >> n/c - before dinner:  n/c >> 93, 111 >> 79-153 >> n/c - 2 hours post dinner: 101, 103 >> n/c >> 140 >> n/c - Bedtime:  116-169 >> n/c >> 102 >> n/c Lowest: 79 >> .Marland Kitchen.123 >> 112 >> ? Highest: 160 >> 165 >> ?.  Pt's meals are: - Breakfast: apple + cottage cheese + whole wheat - Lunch: leftovers from dinner; sandwich; salad - Dinner: varies; some fast food - Snacks: veggies; chips Drinks diet sodas  Before the coronavirus pandemic, she was exercising consistently:  Silver sneakers at the [facility] 3 times a week, tai chi once a week.  However, she stopped exercising after the facility closed.  -+ CKD, last BUN/creatinine:  01/03/2021: Glu 107, 23/1.14, GFR 49 05/27/2020: 21/1.17, GFR 45, ACR 66.8 Lab Results  Component Value Date   BUN 21 08/12/2019   CREATININE 1.10 (H) 08/12/2019  Not on ACE inhibitor/ARB. -+ HL; last set of lipids: 01/03/2021: 166/207/41/91 Lab Results  Component Value Date   CHOL 158 12/19/2018   HDL 36 (L) 12/19/2018   LDLCALC 68 12/19/2018   TRIG 272 (H) 12/19/2018   CHOLHDL 4.4 12/19/2018  Not on a statin, but she takes omega-3 fatty acids.  - last eye exam was in 2021: No DR reportedly (Dr. Katy Fitch); + history of cataract surgery -+ Numbness and tingling in her feet.  On B complex.  She was alpha-lipoic acid intermittently  at last visit andI advised her to take it consistently   -she does take this now. She used Lidocaine patches >> discontinued.  We started PN cream , but this was expensive (93$). Voltaren gel works a little better. Tried Neurontin >> did not like it >> restarted by PCP- recently increased. She read about possible side effects from Lyrica >> would not want to try it.  She had stool incontinence >> better after switching to metformin ER.  She is a friend of Dr.  Milta Deiters.  ROS: + See HPI, + disequilibrium  Past Medical History:  Diagnosis Date   Allergy    [name]; allergy shots weekly.     Anxiety    Arthritis    DDD lumbar spine.     Cancer ([place])    breast L x 2; skin basal cell carcinoma [name]).   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   Past Surgical History:  Procedure Laterality Date   BREAST LUMPECTOMY  1996   L breast cancer   BREAST SURGERY N/A    Phreesia 12/12/2019    EYE SURGERY N/A    Phreesia 12/12/2019   MASTECTOMY  2002   Bilateral for breast cancer   VAGINAL HYSTERECTOMY  1985   DUB; uterine fibroids; ovaries intact.   Social History   Socioeconomic History   Marital status: Married    Spouse name: Richardson Landry   Number of children: Not on file   Years of education: Not on file   Highest education level: Master's degree (e.g., MA, MS, MEng, MEd, MSW, MBA)  Occupational History   Occupation: retired    Comment: retired  Tobacco Use   Smoking status: Never   Smokeless tobacco: Never  Substance and Sexual Activity   Alcohol use: No    Alcohol/week: 0.0 standard drinks    Comment: rare   Drug use: No   Sexual activity: Never  Other Topics Concern   Not on file  Social History Narrative   09/30/19 Marital status: married x 20 years; second marriage; happily married      Children:  2 children; 3 grandchildren; no gg      Lives: with husband      Employment: retired in 2005; retired from [employer], 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.      Tobacco: never      Alcohol: rarely;       Exercise:  Silver Sneakers; Tai Chi once per week; exercises 3 days per week; balance class      ADLs: independent with ADLs; drives.  Husband does the cleaning and lifting groceries.      Advanced Directives: YES;  FULL CODE no prolonged measures.              Social Determinants of Health   Financial Resource Strain: Not on file  Food Insecurity: Not on file  Transportation Needs: Not on file  Physical Activity: Not on file  Stress: Not on file  Social Connections: Not on file  Intimate Partner Violence: Not on file   Current Outpatient Medications on File Prior to Visit  Medication Sig Dispense Refill   amoxicillin-clavulanate (AUGMENTIN) 875-125 MG tablet Take 1 tablet by mouth every 12 (twelve) hours. 14 tablet 0   aspirin 81 MG tablet Take 81 mg by mouth daily.     Blood Glucose Monitoring Suppl (ONE TOUCH ULTRA 2) W/DEVICE KIT Inject 1 application  as directed as needed.     Coenzyme Q10 100 MG capsule Take by mouth.     cyanocobalamin 100 MCG tablet Take by mouth.     fexofenadine (ALLEGRA) 180 MG tablet      fluticasone (FLONASE) 50 MCG/ACT nasal spray Place 2 sprays into both nostrils daily. 48 g 3   furosemide (LASIX) 20 MG tablet TAKE  1 OR 2 TABLETS DAILY AS DIRECTED. 90 tablet 0   gabapentin (NEURONTIN) 100 MG capsule Take 2 capsules (200 mg total) by mouth at bedtime. 180 capsule 1   glipiZIDE (GLUCOTROL XL) 5 MG 24 hr tablet TAKE 1 TABLET EVERY DAY WITH BREAKFAST. 90 tablet 3   INVOKANA 100 MG TABS tablet TAKE 1 TABLET ONCE DAILY. 90 tablet 3   ketoconazole (NIZORAL) 2 % cream Apply 1 application topically 2 (two) times daily. 100 g 2   Lancets (FREESTYLE) lancets CHECK BLOOD SUGAR TWICE DAILY. 100 each 0   metFORMIN (GLUCOPHAGE-XR) 500 MG 24 hr tablet TAKE 1 TABLET 3 TIMES DAILY WITH MEALS. 270 tablet 3   Misc Natural Products (TART CHERRY ADVANCED PO) Take 1 capsule by mouth daily.     montelukast (SINGULAIR) 10 MG tablet TAKE ONE TABLET AT BEDTIME. 90 tablet 0   ONETOUCH VERIO test strip USE 1 TEST BLOOD SUGAR TWICE DAILY. 100 each 3   oxybutynin (DITROPAN XL) 15 MG 24 hr tablet TAKE ONE TABLET AT BEDTIME. 90 tablet 0   rifaximin (XIFAXAN) 550 MG TABS tablet TAKE 1 TABLET TWICE DAILY. PRN 60 tablet 3   TOPROL XL 100 MG 24 hr tablet TAKE 1 TABLET TWICE DAILY WITH FOOD. 180 tablet 0   No current facility-administered medications on file prior to visit.   Allergies  Allergen Reactions   Fruit & Vegetable Daily [Nutritional Supplements] Diarrhea   Onion Other (See Comments)    indigestion   Tree Extract Swelling   Sulfa Drugs Cross Reactors Rash    All over the body   Family History  Problem Relation Age of Onset   Heart disease Mother        AMI as cause of death age 46.   Heart disease Father        AMI as cause of death.   Heart disease Brother     PE: BP 128/77 (BP Location: Right Arm, Patient Position:  Sitting, Cuff Size: Normal)   Pulse 78   Ht '5\' 4"'  (1.626 m)   Wt 159 lb 3.2 oz (72.2 kg)   SpO2 95%   BMI 27.33 kg/m  Body mass index is 27.33 kg/m. Wt Readings from Last 3 Encounters:  01/18/21 159 lb 3.2 oz (72.2 kg)  07/19/20 158 lb 3.2 oz (71.8 kg)  12/25/19 160 lb (72.6 kg)   Constitutional: overweight, in NAD, in wheelchair Eyes: PERRLA, EOMI, no exophthalmos ENT: moist mucous membranes, no thyromegaly, no cervical lymphadenopathy Cardiovascular: RRR, No MRG Respiratory: CTA B Gastrointestinal: abdomen soft, NT, ND, BS+ Musculoskeletal: no deformities, strength intact in all 4 Skin: moist, warm, no rashes Neurological: no tremor with outstretched hands, DTR normal in all 4  ASSESSMENT: 1. 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 HL  PLAN:  1.  Patient with controlled diabetes on oral antidiabetic regimen with metformin, sulfonylurea, and that she just inhibitor.  Latest HbA1c was lower, at 6.1% less than 2 months ago.  We did not repeat an HbA1c today.  Upon questioning, she is not checking sugars and her husband complains that she would not even accept home health to check her blood sugars.  He is interested in a CGM for her and I sent the prescription for the freestyle libre CGM to the pharmacy.  I did advise them that this is unlikely to be covered by [facility]  for patients not on insulin, but he would like to see the price and see if he can pay out-of-pocket for it.  After she starts checking blood sugars, we will be better able to adjust her medications and maybe even stopping glipizide.  For now, we will continue the current regimen. - I advised her to: Patient Instructions  Please continue: - Invokana 100 mg in am - Metformin ER 500 mg 3x a day - Glipizide XL 5 mg before b'fast  Please come back for a follow-up appointment in 6 months.  - advised to  check sugars at different times of the day - 1x a day, rotating check times - advised for yearly eye exams >> she is UTD and hospital will call and schedule another appointment as soon as possible - return to clinic in 6 months  2. PN -On B vitamins and we also started alpha lipoic acid.  She  is also using Neurontin-her PCP -She continues to have numbness and tingling in her feet  3. HL -Reviewed latest lipid panel from 01/03/2021: LDL higher than goal, triglycerides high -She is not on a statin but she continues on omega-3 fatty acids  [name], MD PhD [facility] Endocrinology

## 2021-01-18 NOTE — Patient Instructions (Signed)
Please continue: - Invokana 100 mg in am - Metformin ER 500 mg 3x a day - Glipizide XL 5 mg before b'fast  Please come back for a follow-up appointment in 6 months.

## 2021-01-19 ENCOUNTER — Telehealth: Payer: Self-pay | Admitting: Internal Medicine

## 2021-01-19 NOTE — Telephone Encounter (Signed)
Pt's husband called to give an update. He would just like Dr.Gherghe and her nurse to be aware of an incoming authorization for a Freestyle Libre 2.

## 2021-01-21 NOTE — Telephone Encounter (Signed)
Noted  

## 2021-02-02 ENCOUNTER — Telehealth: Payer: Self-pay | Admitting: Internal Medicine

## 2021-02-02 NOTE — Telephone Encounter (Signed)
Pt's husband called to let us know [facility] [employer] has sent [place] over some forms for pt's freestyle libre 2 monitor. All they need is for question 4 to be completed and faxed back to them at the number provided on the form.

## 2021-02-07 NOTE — Telephone Encounter (Signed)
Form needs to have signature and the date that it is faxed over.

## 2021-02-10 NOTE — Telephone Encounter (Signed)
Forms completed and faxed to [phone].

## 2021-02-10 NOTE — Telephone Encounter (Signed)
Patients spouse Jamal Collin called to check status on the [place] forms for patient freestyle libre 2 monitor.  States that [facility] has indicated that question #4 must be completed, installed, and dated the same day it is going to be faxed back to them.

## 2021-02-16 ENCOUNTER — Telehealth: Payer: Self-pay | Admitting: Internal Medicine

## 2021-02-16 NOTE — Telephone Encounter (Signed)
Peer to Peer request for CGM - Andee Poles is not able to approve CGM at nurse level and the request will have to go Medical Director for approval. States that this has to be done by 02/18/21 and that it is OK for on-call provider to do since patients provider is out of the office.    Please call [phone] to advise yes or no for Peer to Peer

## 2021-02-17 ENCOUNTER — Encounter: Payer: Self-pay | Admitting: Internal Medicine

## 2021-02-22 ENCOUNTER — Telehealth: Payer: Self-pay

## 2021-02-22 NOTE — Telephone Encounter (Signed)
Spoke with patient's husband Richardson Landry and scheduled an in-person Palliative Consult for 03/09/21 @ 12:30PM  COVID screening was positive. They both are COVID positive.  No pets in home. Patient lives with husband.  Consent obtained; updated Outlook/Netsmart/Team List and Epic.   Family is aware they may be receiving a call from provider the day before or day of to confirm appointment.

## 2021-02-22 NOTE — Telephone Encounter (Signed)
Attempted to contact patient's husband Richardson Landry to schedule a Palliative Care consult appointment. No answer left a message to return call. Mychart message sent also.

## 2021-03-09 ENCOUNTER — Other Ambulatory Visit: Payer: Self-pay

## 2021-03-09 ENCOUNTER — Other Ambulatory Visit: Payer: Medicare PPO | Admitting: Hospice

## 2021-03-09 DIAGNOSIS — U071 COVID-19: Secondary | ICD-10-CM

## 2021-03-09 DIAGNOSIS — Z515 Encounter for palliative care: Secondary | ICD-10-CM

## 2021-03-09 DIAGNOSIS — R531 Weakness: Secondary | ICD-10-CM

## 2021-03-09 DIAGNOSIS — E119 Type 2 diabetes mellitus without complications: Secondary | ICD-10-CM

## 2021-03-09 NOTE — Progress Notes (Signed)
[facility] Consult Note Telephone: [phone]  Fax: [phone]  PATIENT NAME: [name] [name] [id number] [phone] (home)  DOB: 1941/06/06 MRN: [id number]  PRIMARY CARE PROVIDER:    Harrison [name], [place],  [address] [place] [address] [phone]  REFERRING PROVIDER:   Harrison [name], [address] [place],  [place] [address] [phone]  RESPONSIBLE PARTY:   [name] [name]     Name Relation Home Work Mobile   [place] Spouse [phone]  [phone]      TELEHEALTH VISIT STATEMENT Due to the COVID-19 crisis, this visit was done via telemedicine and it was initiated and consent by this patient and or family. Video-audio (telehealth) contact was unable to be done due to technical barriers from the patient's side.    Palliative Care was asked to follow this patient by consultation request of  [name] Mons, PA to address advance care planning, complex medical decision making and goals of care clarification. This is the initial visit.    ASSESSMENT AND / RECOMMENDATIONS:   Advance Care Planning: Our advance care planning conversation included a discussion about:    The value and importance of advance care planning  Difference between Hospice and Palliative care Exploration of goals of care in the event of a sudden injury or illness  Identification and preparation of a healthcare agent  Review and updating or creation of an  advance directive document . Decision not to resuscitate or to de-escalate disease focused treatments due to poor prognosis.  CODE STATUS: Patient is a DO NOT RESUSCITATE.  Signed DNR uploaded to epic today.  Goals of Care: Goals include to maximize quality of life and symptom management.  Patient is interested in hospice service in her home in the future.  I spent  20 minutes providing this  initial consultation. More than 50% of the time in this consultation was spent on counseling patient and coordinating communication. --------------------------------------------------------------------------------------------------------------------------------------  Symptom Management/Plan: COVID-19 viral infection: Patient completed Paxlovid, well-tolerated.  Coughing has significantly improved. Take Augmentin as ordered to completion.  Continue ongoing supportive care. Weakness: Ongoing weakness and sleepiness especially since COVID infection.  Physical/occupational therapy is recommended for strengthening. Continue Ensure. Neurodegenerative disorder following brain MRI Mar 2021: Associated cognitive decline/memory loss.  Followed by her neurologist. Type II Diabetes: Managed with metformin/Invocana. A1c 6.10 January 2021.   Follow up: Palliative care will continue to follow for complex medical decision making, advance care planning, and clarification of goals. Return 2 weeks or prn.Encouraged to call provider sooner with any concerns.   Family /Caregiver/Community Supports: Patient lives at home with spouse who is well involved in her care.  Strong family support system identified.  HOSPICE ELIGIBILITY/DIAGNOSIS: TBD  Chief Complaint: Initial Palliative care visit  HISTORY OF PRESENT ILLNESS:  [name] [name] is a 80 y.o. year old female  with multiple medical conditions including recent COVID 19 virus infection for which patient has completed Paxlovid.  History of neurodegenerative disorder following brain MRI March 2021, memory loss, type 2 diabetes mellitus.  Patient denies pain/discomfort, no respiratory distress.  Patient endorses weakness; spouse reports she sleeps greater than 20 hours a day.  However she is becoming more alert eating well.  Encouraged adequate oral intake and to optimize physical activity as tolerated. History obtained from review of EMR, discussion with primary team,  caregiver, family and/or [name] [name].  Review and summarization of Epic  records shows history from other than patient. Rest of 10 point ROS asked and negative.    ROS General: Alert, NAD EYES: denies vision changes ENMT: denies dysphagia Cardiovascular: denies chest pain/discomfort Pulmonary: denies cough, denies SOB Abdomen: endorses fair appetite, denies constipation/diarrhea GU: denies dysuria, urinary frequency MSK:  endorses weakness,  no falls reported Skin: denies rashes or wounds Psych: Endorses positive mood Heme/lymph/immuno: denies bruises, abnormal bleeding   PAST MEDICAL HISTORY:  Active Ambulatory Problems    Diagnosis Date Noted   Seasonal allergies 09/12/2011   Anxiety 09/12/2011   HTN (hypertension) 09/12/2011   Breast cancer ([facility]) 09/12/2011   GERD (gastroesophageal reflux disease) 09/12/2011   Dyslipidemia 09/12/2011   Osteopenia 09/12/2011   DJD (degenerative joint disease) 09/12/2011   Factor 5 Leiden mutation, heterozygous ([facility]) 09/12/2011   History of colonic polyps 09/12/2011   Non-melanoma skin cancer 05/13/2014   Vertical diplopia 11/17/2014   IBS (irritable bowel syndrome) 12/01/2014   Controlled diabetes mellitus with diabetic nephropathy, without long-term current use of insulin ([facility]) 07/29/2015   Peripheral polyneuropathy 05/22/2016   Urge incontinence 05/09/2017   Venous stasis 05/09/2017   Insomnia 12/23/2018   Resolved Ambulatory Problems    Diagnosis Date Noted   Diabetes mellitus 09/12/2011   CRI (chronic renal insufficiency) 09/12/2011   Past Medical History:  Diagnosis Date   Allergy    Arthritis    Cancer ([place])    Clotting disorder ([place])    Diabetes mellitus    Diabetes mellitus without complication ([place])    Hypertension     SOCIAL HX:  Social History   Tobacco Use   Smoking status: Never   Smokeless tobacco: Never  Substance Use Topics   Alcohol use: No    Alcohol/week: 0.0 standard drinks    Comment: rare      FAMILY HX:  Family History  Problem Relation Age of Onset   Heart disease Mother        AMI as cause of death age 66.   Heart disease Father        AMI as cause of death.   Heart disease Brother       ALLERGIES:  Allergies  Allergen Reactions   Fruit & Vegetable Daily [Nutritional Supplements] Diarrhea   Onion Other (See Comments)    indigestion   Tree Extract Swelling   Sulfa Drugs Cross Reactors Rash    All over the body      PERTINENT MEDICATIONS:  Outpatient Encounter Medications as of 03/09/2021  Medication Sig   amoxicillin-clavulanate (AUGMENTIN) 875-125 MG tablet Take 1 tablet by mouth every 12 (twelve) hours.   aspirin 81 MG tablet Take 81 mg by mouth daily.   Blood Glucose Monitoring Suppl (ONE TOUCH ULTRA 2) W/DEVICE KIT Inject 1 application as directed as needed.   Coenzyme Q10 100 MG capsule Take by mouth.   Continuous Blood Gluc Receiver (FREESTYLE LIBRE 2 READER) DEVI 1 each by Does not apply route daily.   Continuous Blood Gluc Sensor (FREESTYLE LIBRE 2 SENSOR) MISC 1 each by Does not apply route every 14 (fourteen) days.   cyanocobalamin 100 MCG tablet Take by mouth.   fexofenadine (ALLEGRA) 180 MG tablet    fluticasone (FLONASE) 50 MCG/ACT nasal spray Place 2 sprays into both nostrils daily.   furosemide (LASIX) 20 MG tablet TAKE 1 OR 2 TABLETS DAILY AS DIRECTED.   gabapentin (NEURONTIN) 100 MG capsule Take 2 capsules (200 mg total) by mouth at bedtime.   glipiZIDE (GLUCOTROL XL) 5 MG  24 hr tablet TAKE 1 TABLET EVERY DAY WITH BREAKFAST.   INVOKANA 100 MG TABS tablet TAKE 1 TABLET ONCE DAILY.   ketoconazole (NIZORAL) 2 % cream Apply 1 application topically 2 (two) times daily.   Lancets (FREESTYLE) lancets CHECK BLOOD SUGAR TWICE DAILY.   metFORMIN (GLUCOPHAGE-XR) 500 MG 24 hr tablet TAKE 1 TABLET 3 TIMES DAILY WITH MEALS.   Misc Natural Products (TART CHERRY ADVANCED PO) Take 1 capsule by mouth daily.   montelukast (SINGULAIR) 10 MG tablet TAKE ONE TABLET  AT BEDTIME.   ONETOUCH VERIO test strip USE 1 TEST BLOOD SUGAR TWICE DAILY.   oxybutynin (DITROPAN XL) 15 MG 24 hr tablet TAKE ONE TABLET AT BEDTIME.   rifaximin (XIFAXAN) 550 MG TABS tablet TAKE 1 TABLET TWICE DAILY. PRN   TOPROL XL 100 MG 24 hr tablet TAKE 1 TABLET TWICE DAILY WITH FOOD.   No facility-administered encounter medications on file as of 03/09/2021.     Thank you for the opportunity to participate in the care of Ms. Jamil.  The palliative care team will continue to follow. Please call our office at [phone] if we can be of additional assistance.   Note: Portions of this note were generated with Lobbyist. Dictation errors may occur despite best attempts at proofreading.  Teodoro Spray, NP

## 2021-03-24 ENCOUNTER — Other Ambulatory Visit: Payer: Self-pay

## 2021-03-24 ENCOUNTER — Other Ambulatory Visit: Payer: Medicare PPO | Admitting: Hospice

## 2021-03-24 DIAGNOSIS — Z515 Encounter for palliative care: Secondary | ICD-10-CM

## 2021-03-24 DIAGNOSIS — U071 COVID-19: Secondary | ICD-10-CM

## 2021-03-24 NOTE — Progress Notes (Signed)
[facility] Consult Note Telephone: [phone]  Fax: [phone]  PATIENT NAME: [name] [name] DOB: [birth year] MRN: [id number]  PRIMARY CARE PROVIDER:   Harrison Mons, [place] Harrison Mons, [address],  [place] [address]  REFERRING PROVIDER: Harrison Mons, [place] Harrison Mons, [address],  [place] [address]  RESPONSIBLE PARTY: Spouse Contact Information     Name Relation Home Work Mobile   Myles Rosenthal [phone]  [phone]       Visit is to build trust and highlight Palliative Medicine as specialized medical care for people living with serious illness, aimed at facilitating better quality of life through symptoms relief, assisting with advance care planning and complex medical decision making. Spouse and Brayton Layman are with patient during visit. This is a follow up visit.  RECOMMENDATIONS/PLAN:   Advance Care Planning/Code Status:Patient is a Do Not Resuscitate  Goals of Care: Goals of care include to maximize quality of life and symptom management. Spouse wants to make patient safe, comfortable and eventually die at home. Hospice service at home is desired.  MOST discussions today. Spouse elects comfort measures, no antibiotics, no IV fluids, no feeding tube.  NP signed the MOST form for patient to keep at home; same document uploaded to epic today.  Visit consisted of counseling and education dealing with the complex and emotionally intense issues of symptom management and palliative care in the setting of serious and potentially life-threatening illness. Palliative care team will continue to support patient, patient's family, and medical team.  I spent 25 minutes providing this palliative consult.  More than 50% of it was spent in counseling and coordinating  communication. ----------------------------------------------------------------------------------------------------------------------- Symptom Management/Plan: Gait disturbance: Patient would benefit from PT/OT. Spouse reports previous PT/OT experience was not fruitful and declines therapy. Use of gait belt and rolling walker discussed. He requests hospital bed for safety and enhanced bed mobility for patient. Fall precautions.  Routine CBC BMP Weakness: Ongoing. Encourage activity as tolerated. Balance of rest and performance activity. Bedside commode in place.  Fall:Spouse reports patient has fallen 5 times in the past 5 weeks, 4 of which she fell off from bed.  Spouse not interested in PT/OT.  Recommendation: Patient will benefit from Hospital bed.  Neurodegenerative disorder following brain MRI Mar 2021: Associated ongoing cognitive decline/memory loss.  Followed by her neurologist. Type II Diabetes: Managed with metformin/Invocana. A1c 6.10 January 2021.   Follow up: Palliative care will continue to follow for complex medical decision making, advance care planning, and clarification of goals. Return 2 weeks or prn.Encouraged to call provider sooner with any concerns.    Family /Caregiver/Community Supports: Patient lives at home with spouse who is well involved in her care. Patient has private caregiver that comes 3 times a week to help with ADLs.  Strong family support system identified.   HOSPICE ELIGIBILITY/DIAGNOSIS: TBD   Chief Complaint: Initial Palliative care visit   HISTORY OF PRESENT ILLNESS:  [name] [name] is an 80 y.o. year old female  with multiple medical conditions worsening gait disturbance resulting in 5 falls in the last 5 weeks, per spouse. $ out of the 5 falls involved patient falling out from her bed.  Gait disturbance is worse when patient tries to stand up and walk, holding onto her walker is helpful.  Patient with recent COVID 19 virus infection for which patient has  completed Paxlovid.  History of neurodegenerative disorder following brain MRI March 2021,  memory loss, type 2 diabetes mellitus. Patient endorses weakness and unsteady gait, no pain/discomfort or respiratory distress, no urinary symptoms.  History obtained from review of EMR, discussion with primary team, family and/or patient. Records reviewed and summarized above. All 10 point systems reviewed and are negative except as documented in history of present illness above  Review and summarization of Epic records shows history from other than patient.   Palliative Care was asked to follow this patient o help address complex decision making in the context of advance care planning and goals of care clarification.   PHYSICAL EXAM  General: In no acute distress, appropriately dressed Cardiovascular: regular rate and rhythm; trace edema in LLE Pulmonary: no cough, no increased work of breathing, normal respiratory effort Abdomen: soft, non tender, no guarding, positive bowel sounds in all quadrants GU:  no suprapubic tenderness Eyes: Normal lids, no discharge ENMT: Moist mucous membranes Musculoskeletal:  weakness, unsteady gait Skin: no rash to visible skin, warm without cyanosis,  Psych: non-anxious affect Neurological: Weakness but otherwise non focal Heme/lymph/immuno: no bruises, no bleeding  PERTINENT MEDICATIONS:  Outpatient Encounter Medications as of 03/24/2021  Medication Sig   amoxicillin-clavulanate (AUGMENTIN) 875-125 MG tablet Take 1 tablet by mouth every 12 (twelve) hours.   aspirin 81 MG tablet Take 81 mg by mouth daily.   Blood Glucose Monitoring Suppl (ONE TOUCH ULTRA 2) W/DEVICE KIT Inject 1 application as directed as needed.   Coenzyme Q10 100 MG capsule Take by mouth.   Continuous Blood Gluc Receiver (FREESTYLE LIBRE 2 READER) DEVI 1 each by Does not apply route daily.   Continuous Blood Gluc Sensor (FREESTYLE LIBRE 2 SENSOR) MISC 1 each by Does not apply route every 14  (fourteen) days.   cyanocobalamin 100 MCG tablet Take by mouth.   fexofenadine (ALLEGRA) 180 MG tablet    fluticasone (FLONASE) 50 MCG/ACT nasal spray Place 2 sprays into both nostrils daily.   furosemide (LASIX) 20 MG tablet TAKE 1 OR 2 TABLETS DAILY AS DIRECTED.   gabapentin (NEURONTIN) 100 MG capsule Take 2 capsules (200 mg total) by mouth at bedtime.   glipiZIDE (GLUCOTROL XL) 5 MG 24 hr tablet TAKE 1 TABLET EVERY DAY WITH BREAKFAST.   INVOKANA 100 MG TABS tablet TAKE 1 TABLET ONCE DAILY.   ketoconazole (NIZORAL) 2 % cream Apply 1 application topically 2 (two) times daily.   Lancets (FREESTYLE) lancets CHECK BLOOD SUGAR TWICE DAILY.   metFORMIN (GLUCOPHAGE-XR) 500 MG 24 hr tablet TAKE 1 TABLET 3 TIMES DAILY WITH MEALS.   Misc Natural Products (TART CHERRY ADVANCED PO) Take 1 capsule by mouth daily.   montelukast (SINGULAIR) 10 MG tablet TAKE ONE TABLET AT BEDTIME.   ONETOUCH VERIO test strip USE 1 TEST BLOOD SUGAR TWICE DAILY.   oxybutynin (DITROPAN XL) 15 MG 24 hr tablet TAKE ONE TABLET AT BEDTIME.   rifaximin (XIFAXAN) 550 MG TABS tablet TAKE 1 TABLET TWICE DAILY. PRN   TOPROL XL 100 MG 24 hr tablet TAKE 1 TABLET TWICE DAILY WITH FOOD.   No facility-administered encounter medications on file as of 03/24/2021.    HOSPICE ELIGIBILITY/DIAGNOSIS: TBD  PAST MEDICAL HISTORY:  Past Medical History:  Diagnosis Date   Allergy    [name]; allergy shots weekly.     Anxiety    Arthritis    DDD lumbar spine.     Cancer ([facility])    breast L x 2; skin basal cell carcinoma [name]).   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     Review lab tests/diagnostics Results for PAYAL, STANFORTH (MRN [id number]) as of 03/24/2021 17:36  Ref. Range 09/09/2019 16:12  WBC Latest Ref Range: 3.4 - 10.8  x10E3/uL 9.2  RBC Latest Ref Range: 3.77 - 5.28 x10E6/uL 5.50 (H)  Hemoglobin Latest Ref Range: 11.1 - 15.9 g/dL 15.5  HCT Latest Ref Range: 34.0 - 46.6 % 46.1  MCV Latest Ref Range: 79 - 97 fL 84  MCH Latest Ref Range: 26.6 - 33.0 pg 28.2  MCHC Latest Ref Range: 31.5 - 35.7 g/dL 33.6  RDW Latest Ref Range: 11.7 - 15.4 % 12.7  Platelets Latest Ref Range: 150 - 450 x10E3/uL 396     ALLERGIES:  Allergies  Allergen Reactions   Fruit & Vegetable Daily [Nutritional Supplements] Diarrhea   Onion Other (See Comments)    indigestion   Tree Extract Swelling   Sulfa Drugs Cross Reactors Rash    All over the body      Thank you for the opportunity to participate in the care of TAMEA BAI Please call our office at [phone] if we can be of additional assistance.  Note: Portions of this note were generated with Lobbyist. Dictation errors may occur despite best attempts at proofreading.  Teodoro Spray, NP

## 2021-04-28 ENCOUNTER — Other Ambulatory Visit: Payer: Medicare PPO | Admitting: Hospice

## 2021-04-28 ENCOUNTER — Other Ambulatory Visit: Payer: Self-pay

## 2021-04-28 DIAGNOSIS — G311 Senile degeneration of brain, not elsewhere classified: Secondary | ICD-10-CM

## 2021-04-28 DIAGNOSIS — R531 Weakness: Secondary | ICD-10-CM

## 2021-04-28 DIAGNOSIS — Z515 Encounter for palliative care: Secondary | ICD-10-CM

## 2021-04-28 NOTE — Progress Notes (Signed)
[facility] Consult Note Telephone: [phone]  Fax: [phone]  PATIENT NAME: [name] [name] DOB: Nov 22, 1940 MRN: [id number]  PRIMARY CARE PROVIDER:   Harrison Mons, [place] Harrison Mons, [address],  [place] [address]  REFERRING PROVIDER: Harrison Mons, [place] Harrison Mons, [address] [place],  [place] [address]  RESPONSIBLE PARTY:  Spouse Contact Information     Name Relation Home Work Mobile   [place] Spouse [phone]  [phone]      TELEHEALTH VISIT STATEMENT Due to the COVID-19 crisis, this visit was done via telemedicine and it was initiated and consent by this patient and or family. Video-audio (telehealth) contact was unable to be done due to technical barriers from the patient's side.   Visit is to build trust and highlight Palliative Medicine as specialized medical care for people living with serious illness, aimed at facilitating better quality of life through symptoms relief, assisting with advance care planning and complex medical decision making. Richardson Landry and Isabela are present with patient during visit. This is a follow up visit.  RECOMMENDATIONS/PLAN:   Advance Care Planning/Code Status: Patient is a DNR  Goals of Care: Goals of care include to maximize quality of life and symptom management. Spouse wants to make patient safe, comfortable and eventually die at home. Hospice service at home is desired.  MOST selections: comfort measures, no antibiotics, no IV fluids, no feeding tube.   Visit consisted of counseling and education dealing with the complex and emotionally intense issues of symptom management and palliative care in the setting of serious and potentially life-threatening illness. Palliative care team will continue to support patient, patient's family, and medical team.  Symptom management/Plan:  Weakness: Patient with increasing weakness,  mostly sleeping in bed, no longer  able to ambulate, max assist to stand/transfer. Patient is comfort measures only, PT/OT not needed. High Fall risk. Fall precautions.  Spouse requests hospital bed for safety and enhanced bed mobility for patient. He reports PCP has ordered and he will follow up.  Senile degeneration of the brain: patient more forgetful, repetitive, incontinent of bowel and bladder, sleeping >18 hours/day, slowed thought processes and inability to engage in a sustained meaningful conversation. Patient with ongoing weight loss, lost 8 pounds in the last 2 months , currently 149.8 down from 159 Ibs. Spouse reports patient is followed by Neurologist for neurodegenerative disorder following brain MRI March 2021.   Follow up: Palliative care will continue to follow for complex medical decision making, advance care planning, and clarification of goals. Return 6 weeks or prn. Encouraged to call provider sooner with any concerns.  CHIEF COMPLAINT: Palliative follow up  HISTORY OF PRESENT ILLNESS:  [name] [name] a 80 y.o. female with multiple medical problems including worsening weakness, focal which family has decided against physical therapy, desiring comfort measures.  Follow-up visit scheduled to eval for hospice service eligibility.  History of COVID-19 viral infection, type 2 diabetes mellitus, neurodegenerative disorder, senile degeneration of brain, recurrent falls.  Patient denies pain/discomfort, no respiratory distress, no urinary symptoms.  History obtained from review of EMR, discussion with primary team, family and/or patient. Records reviewed and summarized above. All 10 point systems reviewed and are negative except as documented in history of present illness above  Review and summarization of Epic records shows history from other than patient.   Palliative Care was asked to follow this patient o help address complex decision making in the context  of advance care planning and  goals of care clarification.    PERTINENT MEDICATIONS:  Outpatient Encounter Medications as of 04/28/2021  Medication Sig   amoxicillin-clavulanate (AUGMENTIN) 875-125 MG tablet Take 1 tablet by mouth every 12 (twelve) hours.   aspirin 81 MG tablet Take 81 mg by mouth daily.   Blood Glucose Monitoring Suppl (ONE TOUCH ULTRA 2) W/DEVICE KIT Inject 1 application as directed as needed.   Coenzyme Q10 100 MG capsule Take by mouth.   Continuous Blood Gluc Receiver (FREESTYLE LIBRE 2 READER) DEVI 1 each by Does not apply route daily.   Continuous Blood Gluc Sensor (FREESTYLE LIBRE 2 SENSOR) MISC 1 each by Does not apply route every 14 (fourteen) days.   cyanocobalamin 100 MCG tablet Take by mouth.   fexofenadine (ALLEGRA) 180 MG tablet    fluticasone (FLONASE) 50 MCG/ACT nasal spray Place 2 sprays into both nostrils daily.   furosemide (LASIX) 20 MG tablet TAKE 1 OR 2 TABLETS DAILY AS DIRECTED.   gabapentin (NEURONTIN) 100 MG capsule Take 2 capsules (200 mg total) by mouth at bedtime.   glipiZIDE (GLUCOTROL XL) 5 MG 24 hr tablet TAKE 1 TABLET EVERY DAY WITH BREAKFAST.   INVOKANA 100 MG TABS tablet TAKE 1 TABLET ONCE DAILY.   ketoconazole (NIZORAL) 2 % cream Apply 1 application topically 2 (two) times daily.   Lancets (FREESTYLE) lancets CHECK BLOOD SUGAR TWICE DAILY.   metFORMIN (GLUCOPHAGE-XR) 500 MG 24 hr tablet TAKE 1 TABLET 3 TIMES DAILY WITH MEALS.   Misc Natural Products (TART CHERRY ADVANCED PO) Take 1 capsule by mouth daily.   montelukast (SINGULAIR) 10 MG tablet TAKE ONE TABLET AT BEDTIME.   ONETOUCH VERIO test strip USE 1 TEST BLOOD SUGAR TWICE DAILY.   oxybutynin (DITROPAN XL) 15 MG 24 hr tablet TAKE ONE TABLET AT BEDTIME.   rifaximin (XIFAXAN) 550 MG TABS tablet TAKE 1 TABLET TWICE DAILY. PRN   TOPROL XL 100 MG 24 hr tablet TAKE 1 TABLET TWICE DAILY WITH FOOD.   No facility-administered encounter medications on file as of 04/28/2021.    HOSPICE ELIGIBILITY/DIAGNOSIS: TBD  PAST  MEDICAL HISTORY:  Past Medical History:  Diagnosis Date   Allergy    [name]; allergy shots weekly.     Anxiety    Arthritis    DDD lumbar spine.     Cancer ([facility])    breast L x 2; skin basal cell carcinoma [name]).   Clotting disorder ([place])    no DVT/PE history; heterozygous for Factor V   Diabetes mellitus    Diabetes mellitus without complication ([place])    Phreesia 12/12/2019   Hypertension    IBS (irritable bowel syndrome)    diarrhea predominant.     ALLERGIES:  Allergies  Allergen Reactions   Fruit & Vegetable Daily [Nutritional Supplements] Diarrhea   Onion Other (See Comments)    indigestion   Tree Extract Swelling   Sulfa Drugs Cross Reactors Rash    All over the body      I spent  40 minutes providing this consultation; this includes time spent with patient/family, chart review and documentation. More than 50% of the time in this consultation was spent on counseling and coordinating communication   Thank you for the opportunity to participate in the care of SOPHIANA MILANESE Please call our office at [phone] if we can be of additional assistance.  Note: Portions of this note were generated with Lobbyist. Dictation errors may occur despite best attempts at proofreading.  Teodoro Spray, NP

## 2021-05-09 ENCOUNTER — Other Ambulatory Visit: Payer: Self-pay

## 2021-05-09 ENCOUNTER — Other Ambulatory Visit: Payer: Medicare PPO | Admitting: Hospice

## 2021-05-09 DIAGNOSIS — G311 Senile degeneration of brain, not elsewhere classified: Secondary | ICD-10-CM

## 2021-05-09 DIAGNOSIS — Z515 Encounter for palliative care: Secondary | ICD-10-CM

## 2021-05-09 DIAGNOSIS — R531 Weakness: Secondary | ICD-10-CM

## 2021-05-09 NOTE — Progress Notes (Signed)
[facility] Consult Note Telephone: [phone]  Fax: [phone]  PATIENT NAME: [name] [name] DOB: 04/21/1941 MRN: [id number]  PRIMARY CARE PROVIDER:   [facility] Mons, [place] [facility] Mons, [address],  [place] [address]  REFERRING PROVIDER: [facility], [place] Harrison Mons, [address] [place],  [place] [address]  RESPONSIBLE PARTY:  [place]     Name Relation Home Work Mobile   Myles Rosenthal [phone]  [phone]       Visit is to build trust and highlight Palliative Medicine as specialized medical care for people living with serious illness, aimed at facilitating better quality of life through symptoms relief, assisting with advance care planning and complex medical decision making.  Brayton Layman and Richardson Landry are home with patient during visit. This is a follow up visit.  RECOMMENDATIONS/PLAN:   Advance Care Planning/Code Status: Patient is a Do Not Resuscitate Discussion on de-escalation of care from disease focused to comfort measures on the.  Goals of Care: Goals of care include to maximize quality of life and symptom management. Hospice service is desired by patient/spouse  Visit consisted of counseling and education dealing with the complex and emotionally intense issues of symptom management and palliative care in the setting of serious and potentially life-threatening illness. Palliative care team will continue to support patient, patient's family, and medical team. I spent  46 minutes providing this consultation. More than 50% of the time in this consultation was spent on counseling patient/family and coordinating communication. -------------------------------------------------------------------------------------------------------------------------------------- Symptom management/Plan:  Senile degeneration of brain: Progressive and worsening.  Spouse  and  patient elect hospice service today.   Collaborative coordination with with [facility] CMO and PCP -- both in agreement on patient's terminal prognosis and hospice eligibility.  Hospice referral initiated. Weakness: Patient with increasing weakness, mostly sleeping in bed, no longer  able to ambulate, max assist to stand/transfer. Patient is comfort measures only, PT/OT not needed. High Fall risk. Fall precautions.  Spouse reports patient is followed by Neurologist for neurodegenerative disorder following brain MRI March 2021.    Follow up: Palliative care will continue to follow for complex medical decision making, advance care planning, and clarification of goals. Return 6 weeks or prn. Encouraged to call provider sooner with any concerns.   CHIEF COMPLAINT: Palliative follow up   HISTORY OF PRESENT ILLNESS:  [name] [name] a 80 y.o. female with multiple medical problems including significant mental deterioration/memory loss;  does not often recognize immediate family members/caregivers nor engage in any meaningful conversation. She is unable to transfer/ambulate without assistance, unable to dress self and unable to bathe self. Patient sleeping up to 14 to 18 hours a day, up from about  to 12 hours about 6 months ago.  Patient/family not interested in occupational or physical therapy.  Patient with ongoing weight loss, lost 8 pounds in the last 2 months , currently 149.8 down from 159 Ibs. Family requesting hospice service.  History of COVID-19 viral infection, type 2 diabetes mellitus, neurodegenerative disorder,  recurrent falls.  Patient denies pain/discomfort, no respiratory distress, no urinary symptoms. History obtained from review of EMR, discussion with primary team, family and/or patient. Records reviewed and summarized above. All 10 point systems reviewed and are negative except as documented in history of present illness above  Review and summarization of Epic records shows history from  other than patient.   Palliative Care was asked to follow this patient o help address  complex decision making in the context of advance care planning and goals of care clarification.   PHYSICAL EXAM  General: In no acute distress, appropriately dressed Cardiovascular: regular rate and rhythm; no edema in BLE Pulmonary: no cough, no increased work of breathing, normal respiratory effort Abdomen: soft, non tender, no guarding, positive bowel sounds in all quadrants GU:  no suprapubic tenderness Eyes: Normal lids, no discharge ENMT: Moist mucous membranes Musculoskeletal:  weakness, sarcopenia, non ambulatory Skin: no rash to visible skin, warm without cyanosis,  Psych: non-anxious affect Neurological: Weakness but otherwise non focal, memory loss Heme/lymph/immuno: no bruises, no bleeding  PERTINENT MEDICATIONS:  Outpatient Encounter Medications as of 05/09/2021  Medication Sig   amoxicillin-clavulanate (AUGMENTIN) 875-125 MG tablet Take 1 tablet by mouth every 12 (twelve) hours.   aspirin 81 MG tablet Take 81 mg by mouth daily.   Blood Glucose Monitoring Suppl (ONE TOUCH ULTRA 2) W/DEVICE KIT Inject 1 application as directed as needed.   Coenzyme Q10 100 MG capsule Take by mouth.   Continuous Blood Gluc Receiver (FREESTYLE LIBRE 2 READER) DEVI 1 each by Does not apply route daily.   Continuous Blood Gluc Sensor (FREESTYLE LIBRE 2 SENSOR) MISC 1 each by Does not apply route every 14 (fourteen) days.   cyanocobalamin 100 MCG tablet Take by mouth.   fexofenadine (ALLEGRA) 180 MG tablet    fluticasone (FLONASE) 50 MCG/ACT nasal spray Place 2 sprays into both nostrils daily.   furosemide (LASIX) 20 MG tablet TAKE 1 OR 2 TABLETS DAILY AS DIRECTED.   gabapentin (NEURONTIN) 100 MG capsule Take 2 capsules (200 mg total) by mouth at bedtime.   glipiZIDE (GLUCOTROL XL) 5 MG 24 hr tablet TAKE 1 TABLET EVERY DAY WITH BREAKFAST.   INVOKANA 100 MG TABS tablet TAKE 1 TABLET ONCE DAILY.    ketoconazole (NIZORAL) 2 % cream Apply 1 application topically 2 (two) times daily.   Lancets (FREESTYLE) lancets CHECK BLOOD SUGAR TWICE DAILY.   metFORMIN (GLUCOPHAGE-XR) 500 MG 24 hr tablet TAKE 1 TABLET 3 TIMES DAILY WITH MEALS.   Misc Natural Products (TART CHERRY ADVANCED PO) Take 1 capsule by mouth daily.   montelukast (SINGULAIR) 10 MG tablet TAKE ONE TABLET AT BEDTIME.   ONETOUCH VERIO test strip USE 1 TEST BLOOD SUGAR TWICE DAILY.   oxybutynin (DITROPAN XL) 15 MG 24 hr tablet TAKE ONE TABLET AT BEDTIME.   rifaximin (XIFAXAN) 550 MG TABS tablet TAKE 1 TABLET TWICE DAILY. PRN   TOPROL XL 100 MG 24 hr tablet TAKE 1 TABLET TWICE DAILY WITH FOOD.   No facility-administered encounter medications on file as of 05/09/2021.    HOSPICE ELIGIBILITY/DIAGNOSIS: TBD  PAST MEDICAL HISTORY:  Past Medical History:  Diagnosis Date   Allergy    [name]; allergy shots weekly.     Anxiety    Arthritis    DDD lumbar spine.     Cancer ([place])    breast L x 2; skin basal cell carcinoma [name]).   Clotting disorder ([place])    no DVT/PE history; heterozygous for Factor V   Diabetes mellitus    Diabetes mellitus without complication ([place])    Phreesia 12/12/2019   Hypertension    IBS (irritable bowel syndrome)    diarrhea predominant.     ALLERGIES:  Allergies  Allergen Reactions   Fruit & Vegetable Daily [Nutritional Supplements] Diarrhea   Onion Other (See Comments)    indigestion   Tree Extract Swelling   Sulfa Drugs Cross Reactors Rash  All over the body      Thank you for the opportunity to participate in the care of RENAI LOPATA Please call our office at [phone] if we can be of additional assistance.  Note: Portions of this note were generated with Lobbyist. Dictation errors may occur despite best attempts at proofreading.  Teodoro Spray, NP

## 2021-05-19 ENCOUNTER — Telehealth: Payer: Self-pay | Admitting: Internal Medicine

## 2021-05-19 NOTE — Telephone Encounter (Signed)
[name] [name] with Hospice requests to be called at ph# [phone] re: Patient's diabetic medications

## 2021-05-19 NOTE — Telephone Encounter (Signed)
No, that would not be a good option.  Adding 500 mg of metformin will not effectively take place of 100 mg of Invokana.

## 2021-05-19 NOTE — Telephone Encounter (Signed)
She is already on metformin 500 mg 3 times a day which I would not recommend to increase.  If Invokana is not covered, we can use Farxiga 10 mg daily before breakfast or Jardiance 25 mg daily before breakfast.  Would they accommodate these?

## 2021-05-19 NOTE — Telephone Encounter (Signed)
Called and spoke with Hospice rep Lyndsey who advised pt is on Invokana that is not covered by Hospice care and the pt will have to pay out of pocket. Wanted to know if Metformin dose can be adjusted and the Invokana be discontinued?

## 2021-05-20 NOTE — Telephone Encounter (Signed)
Called and spoke with [facility] and advised If [name] [name] is not covered, we can use Farxiga 10 mg daily before breakfast or Jardiance 25 mg daily before breakfast.  Lyndsey confirmed those were not covered. I did advise her there is patient assistance for both of those alternative medications, if the out of pocket cost is too much.

## 2021-06-30 ENCOUNTER — Non-Acute Institutional Stay (SKILLED_NURSING_FACILITY): Admitting: Internal Medicine

## 2021-06-30 ENCOUNTER — Encounter: Payer: Self-pay | Admitting: Internal Medicine

## 2021-06-30 DIAGNOSIS — E1121 Type 2 diabetes mellitus with diabetic nephropathy: Secondary | ICD-10-CM

## 2021-06-30 DIAGNOSIS — K580 Irritable bowel syndrome with diarrhea: Secondary | ICD-10-CM

## 2021-06-30 DIAGNOSIS — G629 Polyneuropathy, unspecified: Secondary | ICD-10-CM | POA: Diagnosis not present

## 2021-06-30 DIAGNOSIS — F32A Depression, unspecified: Secondary | ICD-10-CM

## 2021-06-30 DIAGNOSIS — G4700 Insomnia, unspecified: Secondary | ICD-10-CM | POA: Diagnosis not present

## 2021-06-30 DIAGNOSIS — N3941 Urge incontinence: Secondary | ICD-10-CM | POA: Diagnosis not present

## 2021-06-30 DIAGNOSIS — F03B0 Unspecified dementia, moderate, without behavioral disturbance, psychotic disturbance, mood disturbance, and anxiety: Secondary | ICD-10-CM

## 2021-06-30 NOTE — Progress Notes (Signed)
Provider:  Veleta Miners MD  Location:  [facility] Room Number: 26 Place of Service:  SNF [phone])  PCP: Virgie Dad, MD Patient Care Team: Virgie Dad, MD as PCP - General (Internal Medicine) [name], MD (Ophthalmology) [name], MD as Consulting Physician (General Surgery) [name], MD as Consulting Physician (Internal Medicine)  Extended Emergency Contact Information Primary Emergency Contact: [name],[name] Address: [place] [address] [place] Phone: [phone] Mobile Phone: [phone] Relation: Spouse  Code Status: DNR Hospice Managed Care Goals of Care: Advanced Directive information Advanced Directives 12/25/2019  Does Patient Have a Medical Advance Directive? Yes  Type of Advance Directive [place]  Does patient want to make changes to medical advance directive? -  Copy of [place] in Chart? Yes - validated most recent copy scanned in chart (See row information)      Chief Complaint  Patient presents with   New Admit To SNF    Admission to SNF    HPI: Patient is a 80 y.o. female seen today for admission to SNF for Long term care  Patient has h/o  Type 2 Diabetes Mellitus Followed with Dr Renne Crigler On Multiple Meds.  Neurodegenerative Dementia Seen Neurology in 03/21 MMSE 25/30 MRI Moderate atrophy. Mild progression of white matter changes most likely due to chronic microvascular ischemia Recent decline Per Palliative care needing help with her ADLS. Husband has decided Hospice care Urinary Incontinence HTN Insmonia, And Neuropathy  Admitted in SNF. [facility] was Oriented. Very aware of her Deficits Had No Complains She is retired Museum/gallery conservator for [employer]  Past Medical History:  Diagnosis Date   [place]; allergy shots weekly.     Anxiety    Arthritis    DDD lumbar spine.     Cancer  ([place])    breast L x 2; skin basal cell carcinoma [name]).   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   Past Surgical History:  Procedure Laterality Date   BREAST LUMPECTOMY  1996   L breast cancer   BREAST SURGERY N/A    Phreesia 12/12/2019   EYE SURGERY N/A    Phreesia 12/12/2019   MASTECTOMY  2002   Bilateral for breast cancer   VAGINAL HYSTERECTOMY  1985   DUB; uterine fibroids; ovaries intact.    reports that she has never smoked. She has never used smokeless tobacco. She reports that she does not drink alcohol and does not use drugs. Social History   Socioeconomic History   Marital status: Married    Spouse name: Richardson Landry   Number of children: Not on file   Years of education: Not on file   Highest education level: Master's degree (e.g., MA, MS, MEng, MEd, MSW, MBA)  Occupational History   Occupation: retired    Comment: retired  Tobacco Use   Smoking status: Never   Smokeless tobacco: Never  Substance and Sexual Activity   Alcohol use: No    Alcohol/week: 0.0 standard drinks    Comment: rare   Drug use: No   Sexual activity: Never  Other Topics Concern   Not on file  Social History Narrative   09/30/19 Marital status: married x 20 years; second marriage; happily married      Children:  2 children; 3 grandchildren; no gg      Lives: with husband      Employment: retired in 2005; retired from Surveyor, 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.      Tobacco: never      Alcohol: rarely;       Exercise:  Silver Sneakers; Tai Chi once per week; exercises 3 days per week; balance class      ADLs: independent with ADLs; drives.  Husband does the cleaning and lifting groceries.      Advanced Directives: YES;  FULL CODE no prolonged measures.              Social Determinants  of Health   Financial Resource Strain: Not on file  Food Insecurity: Not on file  Transportation Needs: Not on file  Physical Activity: Not on file  Stress: Not on file  Social Connections: Not on file  Intimate Partner Violence: Not on file    Functional Status Survey:    Family History  Problem Relation Age of Onset   Heart disease Mother        AMI as cause of death age 26.   Heart disease Father        AMI as cause of death.   Heart disease Brother     Health Maintenance  Topic Date Due   OPHTHALMOLOGY EXAM  11/29/2017   FOOT EXAM  05/14/2019   URINE MICROALBUMIN  05/17/2019   TETANUS/TDAP  07/11/2019   HEMOGLOBIN A1C  05/29/2020   COVID-19 Vaccine (5 - Booster for Pfizer series) 01/14/2021   INFLUENZA VACCINE  02/07/2021   Pneumonia Vaccine 96+ Years old  Completed   DEXA SCAN  Completed   Zoster Vaccines- Shingrix  Completed   HPV VACCINES  Aged Out    Allergies  Allergen Reactions   Fruit & Vegetable Daily [Nutritional Supplements] Diarrhea   Onion Other (See Comments)    indigestion   Tree Extract Swelling   Sulfa Drugs Cross Reactors Rash    All over the body    Outpatient Encounter Medications as of 06/30/2021  Medication Sig   aspirin 81 MG tablet Take 81 mg by mouth daily.   fexofenadine (ALLEGRA) 180 MG tablet    fluticasone (FLONASE) 50 MCG/ACT nasal spray Place 1 spray into both nostrils daily.   gabapentin (NEURONTIN) 100 MG capsule Take 200 mg by mouth 2 (two) times daily.   glipiZIDE (GLUCOTROL XL) 5 MG 24 hr tablet TAKE 1 TABLET EVERY DAY WITH BREAKFAST.   ibuprofen (ADVIL) 200 MG tablet Take 400 mg by mouth every 4 (four) hours as needed.   INVOKANA 100 MG TABS tablet TAKE 1 TABLET ONCE DAILY.   ketotifen (ZADITOR) 0.025 % ophthalmic solution Place 1 drop into both eyes 2 (two) times daily.   montelukast (SINGULAIR) 10 MG tablet TAKE ONE TABLET AT BEDTIME.   rifaximin (XIFAXAN) 550 MG TABS tablet Take 550 mg by mouth daily as needed.    sertraline (ZOLOFT) 50 MG tablet Take 50 mg by mouth daily.   temazepam (RESTORIL) 15 MG capsule Take 15 mg by mouth 3 (three) times daily as needed for sleep.   tolterodine (DETROL LA) 4 MG 24 hr capsule Take 4 mg by mouth daily.   TOPROL XL 100 MG 24 hr tablet TAKE 1 TABLET TWICE DAILY WITH FOOD.   zinc oxide 20 %  ointment Apply 1 application topically as needed for irritation.   [DISCONTINUED] fluticasone (FLONASE) 50 MCG/ACT nasal spray Place 2 sprays into both nostrils daily.   [DISCONTINUED] gabapentin (NEURONTIN) 100 MG capsule Take 2 capsules (200 mg total) by mouth at bedtime.   amoxicillin-clavulanate (AUGMENTIN) 875-125 MG tablet Take 1 tablet by mouth every 12 (twelve) hours.   Blood Glucose Monitoring Suppl (ONE TOUCH ULTRA 2) W/DEVICE KIT Inject 1 application as directed as needed.   Coenzyme Q10 100 MG capsule Take by mouth.   Continuous Blood Gluc Receiver (FREESTYLE LIBRE 2 READER) DEVI 1 each by Does not apply route daily.   Continuous Blood Gluc Sensor (FREESTYLE LIBRE 2 SENSOR) MISC 1 each by Does not apply route every 14 (fourteen) days.   cyanocobalamin 100 MCG tablet Take by mouth.   furosemide (LASIX) 20 MG tablet TAKE 1 OR 2 TABLETS DAILY AS DIRECTED.   ketoconazole (NIZORAL) 2 % cream Apply 1 application topically 2 (two) times daily.   Lancets (FREESTYLE) lancets CHECK BLOOD SUGAR TWICE DAILY.   metFORMIN (GLUCOPHAGE-XR) 500 MG 24 hr tablet TAKE 1 TABLET 3 TIMES DAILY WITH MEALS.   Misc Natural Products (TART CHERRY ADVANCED PO) Take 1 capsule by mouth daily.   ONETOUCH VERIO test strip USE 1 TEST BLOOD SUGAR TWICE DAILY.   oxybutynin (DITROPAN XL) 15 MG 24 hr tablet TAKE ONE TABLET AT BEDTIME.   [DISCONTINUED] rifaximin (XIFAXAN) 550 MG TABS tablet TAKE 1 TABLET TWICE DAILY. PRN (Patient taking differently: Take 550 mg by mouth. TAKE 1 TABLET TWICE DAILY. PRN)   No facility-administered encounter medications on file as of 06/30/2021.    Review of Systems   Constitutional:  Negative for activity change and appetite change.  HENT: Negative.    Respiratory:  Negative for cough and shortness of breath.   Cardiovascular:  Negative for leg swelling.  Gastrointestinal:  Negative for constipation.  Genitourinary:  Positive for frequency.  Musculoskeletal:  Positive for gait problem. Negative for arthralgias and myalgias.  Skin: Negative.   Neurological:  Positive for weakness. Negative for dizziness.  Psychiatric/Behavioral:  Positive for confusion. Negative for dysphoric mood and sleep disturbance.    Vitals:   06/30/21 1507  BP: 130/71  Pulse: 72  Resp: (!) 22  Temp: (!) 97.3 F (36.3 C)  SpO2: 98%  Weight: 154 lb 3.2 oz (69.9 kg)  Height: _0  (1.626 m)   Body mass index is 26.47 kg/m. Physical Exam Vitals reviewed.  Constitutional:      Appearance: Normal appearance.  HENT:     Head: Normocephalic.     Nose: Nose normal.     Mouth/Throat:     Mouth: Mucous membranes are moist.     Pharynx: Oropharynx is clear.  Eyes:     Pupils: Pupils are equal, round, and reactive to light.  Cardiovascular:     Rate and Rhythm: Normal rate and regular rhythm.     Pulses: Normal pulses.     Heart sounds: Normal heart sounds. No murmur heard. Pulmonary:     Effort: Pulmonary effort is normal.     Breath sounds: Normal breath sounds.  Abdominal:     General: Abdomen is flat. Bowel sounds are normal.     Palpations: Abdomen is soft.  Musculoskeletal:        General: No swelling.     Cervical back: Neck supple.  Skin:    General: Skin is warm.  Neurological:     General: No focal deficit present.  Mental Status: She is alert and oriented to person, place, and time.  Psychiatric:        Mood and Affect: Mood normal.        Thought Content: Thought content normal.    Labs reviewed: Basic Metabolic Panel: No results for input(s): NA, K, CL, CO2, GLUCOSE, BUN, CREATININE, CALCIUM, MG, PHOS in the last 8760 hours. Liver Function  Tests: No results for input(s): AST, ALT, ALKPHOS, BILITOT, PROT, ALBUMIN in the last 8760 hours. No results for input(s): LIPASE, AMYLASE in the last 8760 hours. No results for input(s): AMMONIA in the last 8760 hours. CBC: No results for input(s): WBC, NEUTROABS, HGB, HCT, MCV, PLT in the last 8760 hours. Cardiac Enzymes: No results for input(s): CKTOTAL, CKMB, CKMBINDEX, TROPONINI in the last 8760 hours. BNP: Invalid input(s): POCBNP Lab Results  Component Value Date   HGBA1C 6.2 (A) 11/27/2019   Lab Results  Component Value Date   TSH 3.100 08/12/2019   Lab Results  Component Value Date   VITAMINB12 >2000 (H) 08/12/2019   No results found for: FOLATE No results found for: IRON, TIBC, FERRITIN  Imaging and Procedures obtained prior to SNF admission: No results found.  Assessment/Plan  1. Controlled type 2 diabetes mellitus with diabetic nephropathy, without long-term current use of insulin (HCC) Was taken off Metformin Not sure why She is on Glipizide and Invokana Needs CBG Q weekly also Repeat A1C   2. Peripheral polyneuropathy On Neurontin  3. Insomnia, unspecified type Restoril 3/day PRN per Hospice  4. Urge incontinence Continue Ditropan  5. Moderate dementia without behavioral disturbance,  Diagnosed By Neurology with Neurodegenerative disorder Was recommended Namenda Not on any meds Enrolled in Hospice for Supportive care 6. Depression, unspecified depression type Mood is good on Zoloft No GDR  7. Irritable bowel syndrome with diarrhea  Xafixan prn 8 HTN On Toprol   Family/ staff Communication:   Labs/tests ordered:

## 2021-07-13 ENCOUNTER — Encounter: Payer: Self-pay | Admitting: Internal Medicine

## 2021-07-15 ENCOUNTER — Other Ambulatory Visit: Payer: Self-pay

## 2021-07-15 MED ORDER — TEMAZEPAM 15 MG PO CAPS: 15.0000 mg | ORAL_CAPSULE | Freq: Every evening | ORAL | 0 refills | Status: DC | PRN

## 2021-07-15 NOTE — Telephone Encounter (Signed)
Refill request received from [facility] for Temazepam 15mg . Take one capsule at bedtime as needed  Medication pended and sent to Windell Moulding, NP for approval.

## 2021-07-18 ENCOUNTER — Encounter: Payer: Self-pay | Admitting: Orthopedic Surgery

## 2021-07-18 ENCOUNTER — Non-Acute Institutional Stay (SKILLED_NURSING_FACILITY): Payer: Medicare Other | Admitting: Orthopedic Surgery

## 2021-07-18 DIAGNOSIS — E1121 Type 2 diabetes mellitus with diabetic nephropathy: Secondary | ICD-10-CM | POA: Diagnosis not present

## 2021-07-18 DIAGNOSIS — I10 Essential (primary) hypertension: Secondary | ICD-10-CM

## 2021-07-18 DIAGNOSIS — F03B0 Unspecified dementia, moderate, without behavioral disturbance, psychotic disturbance, mood disturbance, and anxiety: Secondary | ICD-10-CM

## 2021-07-18 DIAGNOSIS — R296 Repeated falls: Secondary | ICD-10-CM

## 2021-07-18 DIAGNOSIS — G629 Polyneuropathy, unspecified: Secondary | ICD-10-CM

## 2021-07-18 DIAGNOSIS — N3941 Urge incontinence: Secondary | ICD-10-CM

## 2021-07-18 DIAGNOSIS — K580 Irritable bowel syndrome with diarrhea: Secondary | ICD-10-CM

## 2021-07-18 DIAGNOSIS — F32A Depression, unspecified: Secondary | ICD-10-CM

## 2021-07-18 DIAGNOSIS — G4700 Insomnia, unspecified: Secondary | ICD-10-CM

## 2021-07-18 NOTE — Progress Notes (Signed)
Location:   [facility] Room Number: 26-A Place of Service:  SNF [phone]) Provider:  Windell Moulding, NP  .  Patient Care Team: Virgie Dad, MD as PCP - General (Internal Medicine) [name], MD (Ophthalmology) [name], MD as Consulting Physician (General Surgery) [name], MD as Consulting Physician (Internal Medicine)  Extended Emergency Contact Information Primary Emergency Contact: [name],[name] Address: [place] [address] [place] Phone: [phone] Mobile Phone: [phone] Relation: Spouse  Code Status:  DNR Goals of care: Advanced Directive information Advanced Directives 07/18/2021  Does Patient Have a Medical Advance Directive? Yes  Type of Paramedic of [place];Living will;Out of facility DNR (pink MOST or yellow form)  Does patient want to make changes to medical advance directive? No - Patient declined  Copy of [place] in Chart? Yes - validated most recent copy scanned in chart (See row information)     Chief Complaint  Patient presents with   Acute Visit    Frequent Falls.    HPI:  Pt is a 81 y.o. female seen today for an acute visit for frequent falls.   She currently resides on the skilled nursing unit at [facility]. PMH: HTN, GERD, IBS, T2DM, polyneuropathy, osteopenia, breast cancer, anxiety, and insomnia.   01/07 she was found sitting on her bottom soiled. No apparent injury, vitals stable. 01/08 she was found by nursing staff laying next to her bed. No apparent injuries, vitals stable. She is a poor historian due to moderate dementia. She reports legs feeling weak if she stands too long. She will use wheelchair or walker to ambulate. Denies pain today, able to more arms and legs without difficulty.   Dementia- diagnosed with neurodegenerative disorder per neurology, poor safety awareness, she has trouble verbalizing needs,  no behavioral outbursts, recently enrolled into hospice T2DM- A1c 6.1 01/03/2021, no hypoglycemic events, remains on glipizide and Invokana HTN- no recent lab work, remains on metoprolol Peripheral neuropathy- denies increased pain, remains on gabapentin  Insomnia- sleeping well through night- per nursing staff, remains on temazepam prn Urge incontinence- remains on Ditropan IBS- no recent episodes, remains on rifaximin Depression- no mood changes, very pleasant today, remains on Zoloft   Past Medical History:  Diagnosis Date   Allergy    [name]; allergy shots weekly.     Anxiety    Arthritis    DDD lumbar spine.     Cancer ([place])    breast L x 2; skin basal cell carcinoma [name]).   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   Past Surgical History:  Procedure Laterality Date   BREAST LUMPECTOMY  1996   L breast cancer   BREAST SURGERY N/A    Phreesia 12/12/2019   EYE SURGERY N/A    Phreesia 12/12/2019   MASTECTOMY  2002   Bilateral for breast cancer   VAGINAL HYSTERECTOMY  1985   DUB; uterine fibroids; ovaries intact.    Allergies  Allergen Reactions   Fruit & Vegetable Daily [Nutritional Supplements] Diarrhea   Onion Other (See Comments)    indigestion   Tree Extract Swelling   Sulfa Drugs Cross Reactors Rash  All over the body    Allergies as of 07/18/2021       Reactions   Fruit & Vegetable Daily [nutritional Supplements] Diarrhea   Onion Other (See Comments)   indigestion   Tree Extract Swelling   Sulfa Drugs Cross Reactors Rash   All over the body        Medication List        Accurate as of July 18, 2021  9:16 AM. If you have any questions, ask your nurse or doctor.          STOP taking these medications    cyanocobalamin 100 MCG  tablet Stopped by: Yvonna Alanis, NP   ketoconazole 2 % cream Commonly known as: NIZORAL Stopped by: Yvonna Alanis, NP       TAKE these medications    aspirin 81 MG tablet Take 81 mg by mouth daily.   fexofenadine 180 MG tablet Commonly known as: ALLEGRA Take 180 mg by mouth daily.   fluticasone 50 MCG/ACT nasal spray Commonly known as: FLONASE Place 1 spray into both nostrils daily.   gabapentin 100 MG capsule Commonly known as: NEURONTIN Take 200 mg by mouth 2 (two) times daily.   glipiZIDE 5 MG 24 hr tablet Commonly known as: GLUCOTROL XL TAKE 1 TABLET EVERY DAY WITH BREAKFAST.   ibuprofen 200 MG tablet Commonly known as: ADVIL Take 200 mg by mouth every 4 (four) hours as needed.   Invokana 100 MG Tabs tablet Generic drug: canagliflozin TAKE 1 TABLET ONCE DAILY.   ketotifen 0.025 % ophthalmic solution Commonly known as: ZADITOR Place 1 drop into both eyes 2 (two) times daily.   montelukast 10 MG tablet Commonly known as: SINGULAIR Take 10 mg by mouth every morning. What changed: Another medication with the same name was removed. Continue taking this medication, and follow the directions you see here. Changed by: Yvonna Alanis, NP   rifaximin 550 MG Tabs tablet Commonly known as: XIFAXAN Take 550 mg by mouth daily as needed.   sertraline 50 MG tablet Commonly known as: ZOLOFT Take 50 mg by mouth daily.   temazepam 15 MG capsule Commonly known as: RESTORIL Take 15 mg by mouth 3 (three) times daily. What changed: Another medication with the same name was removed. Continue taking this medication, and follow the directions you see here. Changed by: Yvonna Alanis, NP   tolterodine 4 MG 24 hr capsule Commonly known as: DETROL LA Take 4 mg by mouth daily.   Toprol XL 100 MG 24 hr tablet Generic drug: metoprolol succinate TAKE 1 TABLET TWICE DAILY WITH FOOD.   zinc oxide 20 % ointment Apply 1 application topically as needed for irritation.        Review  of Systems  Unable to perform ROS: Dementia   Immunization History  Administered Date(s) Administered   Influenza Split 03/19/2012, 03/19/2013   Influenza, High Dose Seasonal PF 03/29/2018   Influenza,inj,Quad PF,6+ Mos 04/09/2014, 03/22/2015, 03/24/2016   Influenza-Unspecified 04/09/2017, 03/24/2018, 04/16/2020   Moderna Sars-Covid-2 Vaccination 05/19/2021   PFIZER(Purple Top)SARS-COV-2 Vaccination 08/17/2019, 09/10/2019, 04/06/2020, 11/19/2020   Pneumococcal Conjugate-13 11/05/2014   Pneumococcal Polysaccharide-23 07/10/2004, 12/12/2010, 05/02/2016   Pneumococcal-Unspecified 07/10/2004, 12/12/2010, 05/02/2016   Td 07/10/2009   Zoster Recombinat (Shingrix) 04/09/2017, 07/19/2017   Zoster, Live 07/10/2006   Pertinent  Health Maintenance Due  Topic Date Due   OPHTHALMOLOGY EXAM  11/29/2017   FOOT EXAM  05/14/2019   URINE MICROALBUMIN  05/17/2019   HEMOGLOBIN A1C  05/29/2020  INFLUENZA VACCINE  02/07/2021   DEXA SCAN  Completed   Fall Risk 12/23/2018 09/30/2019 12/15/2019 12/25/2019 09/18/2020  Falls in the past year? 0 1 0 0 -  Was there an injury with Fall? 0 0 - 0 -  Fall Risk Category Calculator 0 1 - 0 -  Fall Risk Category Low Low - Low -  Patient Fall Risk Level - - - Low fall risk Low fall risk  Patient at Risk for Falls Due to - - - - -  Patient at Risk for Falls Due to - - - - -  Fall risk Follow up - - Falls evaluation completed Falls evaluation completed;Education provided -   Functional Status Survey:    Vitals:   07/18/21 0907  BP: (!) 144/75  Pulse: 65  Resp: 19  Temp: (!) 97 F (36.1 C)  SpO2: 96%  Weight: 154 lb 12.8 oz (70.2 kg)  Height: 5\' 4"  (1.626 m)   Body mass index is 26.57 kg/m. Physical Exam Vitals reviewed.  Constitutional:      General: She is not in acute distress. HENT:     Head: Normocephalic.  Eyes:     General:        Right eye: No discharge.        Left eye: No discharge.  Neck:     Vascular: No carotid bruit.   Cardiovascular:     Rate and Rhythm: Normal rate and regular rhythm.     Pulses: Normal pulses.     Heart sounds: Normal heart sounds. No murmur heard. Pulmonary:     Effort: Pulmonary effort is normal. No respiratory distress.     Breath sounds: Normal breath sounds. No wheezing.  Abdominal:     General: Bowel sounds are normal. There is no distension.     Palpations: Abdomen is soft.     Tenderness: There is no abdominal tenderness.  Musculoskeletal:     Cervical back: Normal range of motion.     Right lower leg: No edema.     Left lower leg: No edema.     Comments: Bilateral hand grips 5/5. FROM to upper arms, denies pain. Bilateral dorsal flexion 5/5. FROM lower extremities. No deformities, bruising or skin tear to extremities.   Lymphadenopathy:     Cervical: No cervical adenopathy.  Skin:    General: Skin is warm and dry.     Capillary Refill: Capillary refill takes less than 2 seconds.  Neurological:     General: No focal deficit present.     Mental Status: She is alert. Mental status is at baseline.     Motor: Weakness present.     Gait: Gait abnormal.     Comments: Wheelchair/walker  Psychiatric:        Mood and Affect: Mood normal.        Behavior: Behavior normal.        Cognition and Memory: Memory is impaired.    Labs reviewed: No results for input(s): NA, K, CL, CO2, GLUCOSE, BUN, CREATININE, CALCIUM, MG, PHOS in the last 8760 hours. No results for input(s): AST, ALT, ALKPHOS, BILITOT, PROT, ALBUMIN in the last 8760 hours. No results for input(s): WBC, NEUTROABS, HGB, HCT, MCV, PLT in the last 8760 hours. Lab Results  Component Value Date   TSH 3.100 08/12/2019   Lab Results  Component Value Date   HGBA1C 6.2 (A) 11/27/2019   Lab Results  Component Value Date   CHOL 158 12/19/2018   HDL 36 (L)  12/19/2018   LDLCALC 68 12/19/2018   TRIG 272 (H) 12/19/2018   CHOLHDL 4.4 12/19/2018    Significant Diagnostic Results in last 30 days:  No results  found.  Assessment/Plan 1. Frequent falls - found sitting on floor 01/07 and 01/08 - no apparent injury - poor safety awareness due to dementia- forgets to use call bell - recommend blue mats at bedside when in bed  2. Moderate dementia without behavioral disturbance, psychotic disturbance, mood disturbance, or anxiety, unspecified dementia type - recently enrolled in hospice - no recent behavioral outbursts -cont skilled nursing care   3. Controlled type 2 diabetes mellitus with diabetic nephropathy, without long-term current use of insulin (HCC) - A1c 6.1 12/2020 - cont glipizide and Invokana - cont weekly CBG - A1c- collect 07/21/2020  4. Primary hypertension - controlled - cont metoprolol  5. Peripheral polyneuropathy - cont gabapentin  6. Insomnia, unspecified type - sleeping well- per nursing - cont temazepam prn  7. Urge incontinence - stable with Ditropan  8. Irritable bowel syndrome with diarrhea - no recent episodes - cont rifaximin  9. Depression, unspecified depression type - no mood changes - cont Zoloft    Family/ staff Communication: plan discussed with patient and nurse  Labs/tests ordered:  A1c

## 2021-07-22 LAB — HEMOGLOBIN A1C: Hemoglobin A1C: 6

## 2021-07-26 ENCOUNTER — Ambulatory Visit: Payer: Medicare PPO | Admitting: Internal Medicine

## 2021-07-27 ENCOUNTER — Non-Acute Institutional Stay (SKILLED_NURSING_FACILITY): Payer: Medicare Other | Admitting: Orthopedic Surgery

## 2021-07-27 ENCOUNTER — Encounter: Payer: Self-pay | Admitting: Orthopedic Surgery

## 2021-07-27 DIAGNOSIS — J302 Other seasonal allergic rhinitis: Secondary | ICD-10-CM

## 2021-07-27 DIAGNOSIS — I10 Essential (primary) hypertension: Secondary | ICD-10-CM | POA: Diagnosis not present

## 2021-07-27 DIAGNOSIS — G629 Polyneuropathy, unspecified: Secondary | ICD-10-CM

## 2021-07-27 DIAGNOSIS — E1121 Type 2 diabetes mellitus with diabetic nephropathy: Secondary | ICD-10-CM

## 2021-07-27 DIAGNOSIS — K580 Irritable bowel syndrome with diarrhea: Secondary | ICD-10-CM

## 2021-07-27 DIAGNOSIS — F03B0 Unspecified dementia, moderate, without behavioral disturbance, psychotic disturbance, mood disturbance, and anxiety: Secondary | ICD-10-CM

## 2021-07-27 DIAGNOSIS — R296 Repeated falls: Secondary | ICD-10-CM | POA: Diagnosis not present

## 2021-07-27 DIAGNOSIS — N3941 Urge incontinence: Secondary | ICD-10-CM

## 2021-07-27 DIAGNOSIS — F32A Depression, unspecified: Secondary | ICD-10-CM

## 2021-07-27 DIAGNOSIS — G4700 Insomnia, unspecified: Secondary | ICD-10-CM

## 2021-07-27 NOTE — Progress Notes (Signed)
Location:   [place] Room Number: 26 Place of Service:  SNF ([phone]) Provider:  Windell Moulding, NP  Virgie Dad, MD  Patient Care Team: Virgie Dad, MD as PCP - General (Internal Medicine) [name], MD (Ophthalmology) [name], MD as Consulting Physician (General Surgery) [name], MD as Consulting Physician (Internal Medicine)  Extended Emergency Contact Information Primary Emergency Contact: [name],[name] Address: [place] [address] [place] Phone: [phone] Mobile Phone: [phone] Relation: Spouse  Code Status:  DNR Goals of care: Advanced Directive information Advanced Directives 07/27/2021  Does Patient Have a Medical Advance Directive? Yes  Type of Paramedic of [place];Living will;Out of facility DNR (pink MOST or yellow form)  Does patient want to make changes to medical advance directive? No - Patient declined  Copy of [place] in Chart? No - copy requested  Pre-existing out of facility DNR order (yellow form or pink MOST form) Yellow form placed in chart (order not valid for inpatient use);Pink MOST form placed in chart (order not valid for inpatient use)     Chief Complaint  Patient presents with   Medical Management of Chronic Issues    Routine follow up visit.   Health Maintenance    Eye exam, foot exam, urine microalbumin, tetanus/tdap, hemoglobin A1C, flu vaccine, 5th COVID booster    HPI:  Pt is a 81 y.o. female seen today for medical management of chronic diseases.    She currently resides on the skilled nursing unit at [facility]. PMH: HTN, GERD, IBS, T2DM, polyneuropathy, osteopenia, breast cancer, anxiety, and insomnia.    Dementia- MMSE 25/30 2021, diagnosed with neurodegenerative disorder per neurology, poor safety awareness, she has trouble verbalizing needs, no behavioral outbursts, recently enrolled into  hospice Frequent falls- hospice approved 2 sessions of PT, ambulated 120 ft today, minimal assistance, ataxic gait noted T2DM- A1c 6.1 01/03/2021, sugars averaging 120-130's, no hypoglycemic events, remains on glipizide and Invokana HTN- no recent lab work, remains on metoprolol Peripheral neuropathy- denies increased pain, remains on gabapentin  Insomnia- sleeping well through night- per nursing staff, remains on temazepam prn Urge incontinence- remains on Ditropan IBS- no recent episodes, rifaximin discontinued  Depression- no mood changes, very pleasant today, remains on Zoloft Allergies- stable with Allegra, Flonase and Singulair daily  Last fall 01/10, no apparent injury. Adjusting well to skilled nursing. She has attended art class. Eating some meals in dining room.   Recent blood pressures:  01/17- 118/22  01/11- 114/65  01/10- 129/70  Recent weights:  01/04- 154.8 lbs  12/16- 154.2 lbs   Past Medical History:  Diagnosis Date   Allergy    [name]; allergy shots weekly.     Anxiety    Arthritis    DDD lumbar spine.     Cancer ([place])    breast L x 2; skin basal cell carcinoma [name]).   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   Past Surgical History:  Procedure Laterality Date   BREAST LUMPECTOMY  1996   L breast cancer   BREAST SURGERY N/A    Phreesia 12/12/2019   EYE SURGERY N/A    Phreesia 12/12/2019  MASTECTOMY  2002   Bilateral for breast cancer   VAGINAL HYSTERECTOMY  1985   DUB; uterine fibroids; ovaries intact.    Allergies  Allergen Reactions   Fruit & Vegetable Daily [Nutritional Supplements] Diarrhea   Onion Other (See Comments)    indigestion   Tree Extract Swelling   Sulfa Drugs Cross Reactors Rash    All over the body    Allergies as of  07/27/2021       Reactions   Fruit & Vegetable Daily [nutritional Supplements] Diarrhea   Onion Other (See Comments)   indigestion   Tree Extract Swelling   Sulfa Drugs Cross Reactors Rash   All over the body        Medication List        Accurate as of July 27, 2021  2:12 PM. If you have any questions, ask your nurse or doctor.          STOP taking these medications    rifaximin 550 MG Tabs tablet Commonly known as: XIFAXAN Stopped by: Yvonna Alanis, NP       TAKE these medications    aspirin 81 MG tablet Take 81 mg by mouth daily.   fexofenadine 180 MG tablet Commonly known as: ALLEGRA Take 180 mg by mouth daily.   fluticasone 50 MCG/ACT nasal spray Commonly known as: FLONASE Place 1 spray into both nostrils daily.   gabapentin 100 MG capsule Commonly known as: NEURONTIN Take 200 mg by mouth 2 (two) times daily.   glipiZIDE 5 MG 24 hr tablet Commonly known as: GLUCOTROL XL TAKE 1 TABLET EVERY DAY WITH BREAKFAST.   ibuprofen 200 MG tablet Commonly known as: ADVIL Take 200 mg by mouth every 4 (four) hours as needed.   Invokana 100 MG Tabs tablet Generic drug: canagliflozin TAKE 1 TABLET ONCE DAILY.   ketotifen 0.025 % ophthalmic solution Commonly known as: ZADITOR Place 1 drop into both eyes 2 (two) times daily.   montelukast 10 MG tablet Commonly known as: SINGULAIR Take 10 mg by mouth every morning.   sertraline 50 MG tablet Commonly known as: ZOLOFT Take 50 mg by mouth daily.   temazepam 15 MG capsule Commonly known as: RESTORIL Take 15 mg by mouth 3 (three) times daily.   tolterodine 4 MG 24 hr capsule Commonly known as: DETROL LA Take 4 mg by mouth daily.   Toprol XL 100 MG 24 hr tablet Generic drug: metoprolol succinate TAKE 1 TABLET TWICE DAILY WITH FOOD.   zinc oxide 20 % ointment Apply 1 application topically as needed for irritation.        Review of Systems  Unable to perform ROS: Dementia   Immunization History   Administered Date(s) Administered   Influenza Split 03/19/2012, 03/19/2013   Influenza, High Dose Seasonal PF 03/29/2018   Influenza,inj,Quad PF,6+ Mos 04/09/2014, 03/22/2015, 03/24/2016   Influenza-Unspecified 04/09/2017, 03/24/2018, 04/16/2020   Moderna Sars-Covid-2 Vaccination 05/19/2021   PFIZER(Purple Top)SARS-COV-2 Vaccination 08/17/2019, 09/10/2019, 04/06/2020, 11/19/2020   Pneumococcal Conjugate-13 11/05/2014   Pneumococcal Polysaccharide-23 07/10/2004, 12/12/2010, 05/02/2016   Pneumococcal-Unspecified 07/10/2004, 12/12/2010, 05/02/2016   Td 07/10/2009   Zoster Recombinat (Shingrix) 04/09/2017, 07/19/2017   Zoster, Live 07/10/2006   Pertinent  Health Maintenance Due  Topic Date Due   OPHTHALMOLOGY EXAM  11/29/2017   FOOT EXAM  05/14/2019   URINE MICROALBUMIN  05/17/2019   HEMOGLOBIN A1C  05/29/2020   INFLUENZA VACCINE  02/07/2021   DEXA SCAN  Completed   Fall Risk 12/23/2018 09/30/2019 12/15/2019 12/25/2019 09/18/2020  Falls in the past year? 0 1 0 0 -  Was there an injury with Fall? 0 0 - 0 -  Fall Risk Category Calculator 0 1 - 0 -  Fall Risk Category Low Low - Low -  Patient Fall Risk Level - - - Low fall risk Low fall risk  Patient at Risk for Falls Due to - - - - -  Patient at Risk for Falls Due to - - - - -  Fall risk Follow up - - Falls evaluation completed Falls evaluation completed;Education provided -   Functional Status Survey:    Vitals:   07/27/21 1318  BP: 118/72  Pulse: 72  Resp: 18  Temp: 97.7 F (36.5 C)  SpO2: 97%  Weight: 154 lb 12.8 oz (70.2 kg)  Height: 5\' 4"  (1.626 m)   Body mass index is 26.57 kg/m. Physical Exam Vitals reviewed.  Constitutional:      General: She is not in acute distress. HENT:     Head: Normocephalic.     Right Ear: There is no impacted cerumen.     Left Ear: There is no impacted cerumen.     Nose: Nose normal.     Mouth/Throat:     Mouth: Mucous membranes are moist.  Eyes:     General:        Right eye: No  discharge.        Left eye: No discharge.  Neck:     Vascular: No carotid bruit.  Cardiovascular:     Rate and Rhythm: Normal rate and regular rhythm.     Pulses: Normal pulses.     Heart sounds: Normal heart sounds. No murmur heard. Pulmonary:     Effort: Pulmonary effort is normal. No respiratory distress.     Breath sounds: Normal breath sounds. No wheezing.  Abdominal:     General: Bowel sounds are normal. There is no distension.     Palpations: Abdomen is soft.     Tenderness: There is no abdominal tenderness.  Musculoskeletal:     Cervical back: Normal range of motion.     Right lower leg: No edema.     Left lower leg: No edema.  Lymphadenopathy:     Cervical: No cervical adenopathy.  Skin:    General: Skin is warm and dry.     Capillary Refill: Capillary refill takes less than 2 seconds.  Neurological:     General: No focal deficit present.     Mental Status: She is alert. Mental status is at baseline.     Motor: Weakness present.     Gait: Gait abnormal.     Comments: Wheelchair/walker, ataxic gait per PT  Psychiatric:        Mood and Affect: Mood normal.        Speech: Speech is delayed.        Behavior: Behavior normal.        Cognition and Memory: Memory is impaired.    Labs reviewed: No results for input(s): NA, K, CL, CO2, GLUCOSE, BUN, CREATININE, CALCIUM, MG, PHOS in the last 8760 hours. No results for input(s): AST, ALT, ALKPHOS, BILITOT, PROT, ALBUMIN in the last 8760 hours. No results for input(s): WBC, NEUTROABS, HGB, HCT, MCV, PLT in the last 8760 hours. Lab Results  Component Value Date   TSH 3.100 08/12/2019   Lab Results  Component Value Date   HGBA1C 6.2 (A) 11/27/2019   Lab Results  Component Value Date   CHOL 158 12/19/2018  HDL 36 (L) 12/19/2018   LDLCALC 68 12/19/2018   TRIG 272 (H) 12/19/2018   CHOLHDL 4.4 12/19/2018    Significant Diagnostic Results in last 30 days:  No results found.  Assessment/Plan 1. Moderate dementia  without behavioral disturbance, psychotic disturbance, mood disturbance, or anxiety, unspecified dementia type - adjusting well to SNF - no recent behavioral outbursts - followed by hospice - cont skilled nursing care  2. Frequent falls - 01/10 fall with no injury - hospice approved 2 sessions PT- ambulated 120 ft 1st session - cont falls safety precautions - cont blue mats at night  3. Controlled type 2 diabetes mellitus with diabetic nephropathy, without long-term current use of insulin (HCC) - A1c controlled - no recent hypoglycemia - cont glipizide and Invokana  4. Primary hypertension - controlled  - cont metoprolol  5. Peripheral polyneuropathy - cont gabapentin  6. Insomnia, unspecified type - cont temazepam  7. Urge incontinence - stable with tolterodine  8. Irritable bowel syndrome with diarrhea - no recent episodes - rifaximin discontinued  9. Depression, unspecified depression type - no mood changes - cont Zoloft  10. Seasonal allergies - cont allegra, Flonase and Singulair    Family/ staff Communication: plan discussed with patient and nurse  Labs/tests ordered: none

## 2021-08-24 ENCOUNTER — Non-Acute Institutional Stay (SKILLED_NURSING_FACILITY): Admitting: Orthopedic Surgery

## 2021-08-24 ENCOUNTER — Encounter: Payer: Self-pay | Admitting: Orthopedic Surgery

## 2021-08-24 DIAGNOSIS — J302 Other seasonal allergic rhinitis: Secondary | ICD-10-CM

## 2021-08-24 DIAGNOSIS — E1121 Type 2 diabetes mellitus with diabetic nephropathy: Secondary | ICD-10-CM | POA: Diagnosis not present

## 2021-08-24 DIAGNOSIS — I10 Essential (primary) hypertension: Secondary | ICD-10-CM | POA: Diagnosis not present

## 2021-08-24 DIAGNOSIS — F03B0 Unspecified dementia, moderate, without behavioral disturbance, psychotic disturbance, mood disturbance, and anxiety: Secondary | ICD-10-CM

## 2021-08-24 DIAGNOSIS — R296 Repeated falls: Secondary | ICD-10-CM

## 2021-08-24 DIAGNOSIS — G629 Polyneuropathy, unspecified: Secondary | ICD-10-CM

## 2021-08-24 DIAGNOSIS — K580 Irritable bowel syndrome with diarrhea: Secondary | ICD-10-CM

## 2021-08-24 DIAGNOSIS — G4700 Insomnia, unspecified: Secondary | ICD-10-CM

## 2021-08-24 DIAGNOSIS — F32A Depression, unspecified: Secondary | ICD-10-CM

## 2021-08-24 DIAGNOSIS — N3941 Urge incontinence: Secondary | ICD-10-CM

## 2021-08-24 NOTE — Progress Notes (Signed)
Location:  [facility] Room Number: N26/A Place of Service:  SNF [phone]) Provider:  Yvonna Alanis, NP  Patient Care Team: Virgie Dad, MD as PCP - General (Internal Medicine) [name], MD (Ophthalmology) [name], MD as Consulting Physician (General Surgery) [name], MD as Consulting Physician (Internal Medicine)  Extended Emergency Contact Information Primary Emergency Contact: [name],[name] Address: [place] [address] [place] Phone: [phone] Mobile Phone: [phone] Relation: Spouse  Code Status:  DNR Goals of care: Advanced Directive information Advanced Directives 08/24/2021  Does Patient Have a Medical Advance Directive? Yes  Type of Paramedic of [place];Living will;Out of facility DNR (pink MOST or yellow form)  Does patient want to make changes to medical advance directive? No - Patient declined  Copy of [place] in Chart? No - copy requested  Pre-existing out of facility DNR order (yellow form or pink MOST form) Yellow form placed in chart (order not valid for inpatient use);Pink MOST form placed in chart (order not valid for inpatient use)     Chief Complaint  Patient presents with   Medical Management of Chronic Issues    Routine visit. Discuss the need for ophthalmology and foot exam and Mircoalbumin. Discuss the need for TDAP and influenza vaccine, or post pone if patient refuses.     HPI:  Pt is a 81 y.o. female seen today for medical management of chronic diseases.    Dementia- followed by hospice, MMSE 25/30 2021, diagnosed with neurodegenerative disorder per neurology, poor safety awareness, no behavioral outbursts Frequent falls- 4 falls in January 2023- no apparent injury, blue mats in room, she would like more PT but cannot at this time due to hospice T2DM- A1c 6.1 01/03/2021, sugars averaging 120-150's, no hypoglycemic  events, foot exam done today, remains on asa, glipizide and Invokana HTN- no recent lab work, remains on metoprolol Peripheral neuropathy- remains on gabapentin  Insomnia- sleeping well through night, remains on temazepam prn Urge incontinence- remains on Ditropan IBS- no recent episodes, rifaximin discontinued  Depression- no mood changes, very pleasant today, remains on Zoloft Allergies- stable with Allegra, Flonase and Singulair daily  Recent blood pressures:  02/14- 137/88  02/07- 122/74  02/01- 135/75  Recent weights:  02/01- 154.1 lbs  01/04- 154.8 lbs  12/21- 156.6 lbs       Past Medical History:  Diagnosis Date   Allergy    [name]; allergy shots weekly.     Anxiety    Arthritis    DDD lumbar spine.     Cancer ([place])    breast L x 2; skin basal cell carcinoma [name]).   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   Past Surgical History:  Procedure Laterality Date   BREAST LUMPECTOMY  1996   L breast cancer   BREAST SURGERY N/A    Phreesia 12/12/2019   EYE SURGERY N/A    Phreesia 12/12/2019   MASTECTOMY  2002   Bilateral for breast cancer   VAGINAL HYSTERECTOMY  1985   DUB; uterine fibroids; ovaries intact.    Allergies  Allergen Reactions   Fruit & Vegetable Daily [Nutritional Supplements] Diarrhea   Onion  Other (See Comments)    indigestion   Tree Extract Swelling   Sulfa Drugs Cross Reactors Rash    All over the body    Outpatient Encounter Medications as of 08/24/2021  Medication Sig   aspirin 81 MG tablet Take 81 mg by mouth daily.   fexofenadine (ALLEGRA) 180 MG tablet Take 180 mg by mouth daily.   fluticasone (FLONASE) 50 MCG/ACT nasal spray Place 1 spray into both nostrils daily.   gabapentin (NEURONTIN) 100 MG capsule Take 200 mg by mouth  2 (two) times daily.   glipiZIDE (GLUCOTROL XL) 5 MG 24 hr tablet TAKE 1 TABLET EVERY DAY WITH BREAKFAST.   ibuprofen (ADVIL) 200 MG tablet Take 200 mg by mouth every 4 (four) hours as needed.   INVOKANA 100 MG TABS tablet TAKE 1 TABLET ONCE DAILY.   ketotifen (ZADITOR) 0.025 % ophthalmic solution Place 1 drop into both eyes 2 (two) times daily.   montelukast (SINGULAIR) 10 MG tablet Take 10 mg by mouth every morning.   sertraline (ZOLOFT) 50 MG tablet Take 50 mg by mouth daily.   temazepam (RESTORIL) 7.5 MG capsule Take 7.5 mg by mouth at bedtime as needed for sleep.   tolterodine (DETROL LA) 4 MG 24 hr capsule Take 4 mg by mouth daily.   TOPROL XL 100 MG 24 hr tablet TAKE 1 TABLET TWICE DAILY WITH FOOD.   zinc oxide 20 % ointment Apply 1 application topically as needed for irritation.   [DISCONTINUED] temazepam (RESTORIL) 15 MG capsule Take 15 mg by mouth 3 (three) times daily. (Patient not taking: Reported on 08/24/2021)   No facility-administered encounter medications on file as of 08/24/2021.    Review of Systems  Unable to perform ROS: Dementia   Immunization History  Administered Date(s) Administered   Influenza Split 03/19/2012, 03/19/2013   Influenza, High Dose Seasonal PF 03/29/2018   Influenza,inj,Quad PF,6+ Mos 04/09/2014, 03/22/2015, 03/24/2016   Influenza-Unspecified 04/09/2017, 03/24/2018, 04/16/2020   Moderna Sars-Covid-2 Vaccination 05/19/2021   PFIZER(Purple Top)SARS-COV-2 Vaccination 08/17/2019, 09/10/2019, 04/06/2020, 11/19/2020   Pfizer Covid-19 Vaccine Bivalent Booster 59yrs & up 11/18/2020   Pneumococcal Conjugate-13 11/05/2014   Pneumococcal Polysaccharide-23 07/10/2004, 12/12/2010, 05/02/2016   Pneumococcal-Unspecified 07/10/2004, 12/12/2010, 05/02/2016   Td 07/10/2009   Zoster Recombinat (Shingrix) 04/09/2017, 07/19/2017   Zoster, Live 07/10/2006   Pertinent  Health Maintenance Due  Topic Date Due   OPHTHALMOLOGY EXAM  11/29/2017   FOOT EXAM  05/14/2019    URINE MICROALBUMIN  05/17/2019   INFLUENZA VACCINE  02/07/2021   HEMOGLOBIN A1C  01/19/2022   DEXA SCAN  Completed   Fall Risk 12/23/2018 09/30/2019 12/15/2019 12/25/2019 09/18/2020  Falls in the past year? 0 1 0 0 -  Was there an injury with Fall? 0 0 - 0 -  Fall Risk Category Calculator 0 1 - 0 -  Fall Risk Category Low Low - Low -  Patient Fall Risk Level - - - Low fall risk Low fall risk  Patient at Risk for Falls Due to - - - - -  Patient at Risk for Falls Due to - - - - -  Fall risk Follow up - - Falls evaluation completed Falls evaluation completed;Education provided -   Functional Status Survey:    Vitals:   08/24/21 0915  BP: 137/88  Pulse: 72  Resp: 19  Temp: (!) 97.3 F (36.3 C)  SpO2: 98%  Weight: 154 lb 1.6 oz (69.9 kg)  Height: 5\' 4"  (1.626 m)   Body mass index is  26.45 kg/m. Physical Exam Vitals reviewed.  Constitutional:      General: She is not in acute distress. HENT:     Head: Normocephalic.     Right Ear: There is no impacted cerumen.     Left Ear: There is no impacted cerumen.     Nose: Nose normal.     Mouth/Throat:     Mouth: Mucous membranes are moist.  Eyes:     General:        Right eye: No discharge.        Left eye: No discharge.  Neck:     Vascular: No carotid bruit.  Cardiovascular:     Rate and Rhythm: Normal rate and regular rhythm.     Pulses:          Dorsalis pedis pulses are 1+ on the right side and 1+ on the left side.       Posterior tibial pulses are 1+ on the right side and 1+ on the left side.     Heart sounds: Normal heart sounds. No murmur heard. Pulmonary:     Effort: Pulmonary effort is normal. No respiratory distress.     Breath sounds: Normal breath sounds. No wheezing.  Abdominal:     General: Bowel sounds are normal. There is no distension.     Palpations: Abdomen is soft.     Tenderness: There is no abdominal tenderness.  Musculoskeletal:     Cervical back: Neck supple.     Right lower leg: No edema.      Left lower leg: No edema.     Right foot: Normal range of motion.     Left foot: Normal range of motion.  Feet:     Right foot:     Protective Sensation: 10 sites tested.  10 sites sensed.     Skin integrity: Skin integrity normal.     Toenail Condition: Right toenails are long. Fungal disease present.    Left foot:     Protective Sensation: 10 sites tested.  10 sites sensed.     Skin integrity: Skin integrity normal.     Toenail Condition: Left toenails are long. Fungal disease present. Lymphadenopathy:     Cervical: No cervical adenopathy.  Skin:    General: Skin is warm and dry.     Capillary Refill: Capillary refill takes less than 2 seconds.  Neurological:     General: No focal deficit present.     Mental Status: She is alert. Mental status is at baseline.     Motor: Weakness present.     Gait: Gait abnormal.  Psychiatric:        Mood and Affect: Mood normal.        Behavior: Behavior normal.        Cognition and Memory: Memory is impaired.     Comments: Very pleasant, follows commands, alert to self, place and familiar faces    Labs reviewed: No results for input(s): NA, K, CL, CO2, GLUCOSE, BUN, CREATININE, CALCIUM, MG, PHOS in the last 8760 hours. No results for input(s): AST, ALT, ALKPHOS, BILITOT, PROT, ALBUMIN in the last 8760 hours. No results for input(s): WBC, NEUTROABS, HGB, HCT, MCV, PLT in the last 8760 hours. Lab Results  Component Value Date   TSH 3.100 08/12/2019   Lab Results  Component Value Date   HGBA1C 6.0 07/22/2021   Lab Results  Component Value Date   CHOL 158 12/19/2018   HDL 36 (L) 12/19/2018   LDLCALC 68  12/19/2018   TRIG 272 (H) 12/19/2018   CHOLHDL 4.4 12/19/2018    Significant Diagnostic Results in last 30 days:  No results found.  Assessment/Plan 1. Moderate dementia without behavioral disturbance, psychotic disturbance, mood disturbance, or anxiety, unspecified dementia type - no behavioral outbursts - followed by hospice -  cont skilled nursing care  2. Frequent falls - 4 falls January 2023- no injury - cont blue mats - cont falls safety precautions  3. Controlled type 2 diabetes mellitus with diabetic nephropathy, without long-term current use of insulin (HCC) - A1c stable, no hypoglycemias - cont asa, glipizide and invokana  4. Primary hypertension - controlled without medication  5. Peripheral polyneuropathy - cont gabapentin  6. Insomnia, unspecified type - sleeping well - cont temazepam  7. Urge incontinence -cont Detrol LA  8. Irritable bowel syndrome with diarrhea - no recent episodes - rifaximin discontinued  9. Depression, unspecified depression type - no mood changes - cont Zoloft  10. Seasonal allergies - cont Allegra, Flonase and Singulair    Family/ staff Communication: plan discussed with patient, husband and nurse  Labs/tests ordered:  none

## 2021-08-29 ENCOUNTER — Non-Acute Institutional Stay (SKILLED_NURSING_FACILITY): Admitting: Orthopedic Surgery

## 2021-08-29 ENCOUNTER — Encounter: Payer: Self-pay | Admitting: Orthopedic Surgery

## 2021-08-29 DIAGNOSIS — F03B0 Unspecified dementia, moderate, without behavioral disturbance, psychotic disturbance, mood disturbance, and anxiety: Secondary | ICD-10-CM | POA: Diagnosis not present

## 2021-08-29 DIAGNOSIS — J302 Other seasonal allergic rhinitis: Secondary | ICD-10-CM

## 2021-08-29 DIAGNOSIS — N3941 Urge incontinence: Secondary | ICD-10-CM

## 2021-08-29 DIAGNOSIS — B379 Candidiasis, unspecified: Secondary | ICD-10-CM | POA: Diagnosis not present

## 2021-08-29 DIAGNOSIS — G629 Polyneuropathy, unspecified: Secondary | ICD-10-CM

## 2021-08-29 DIAGNOSIS — G4700 Insomnia, unspecified: Secondary | ICD-10-CM

## 2021-08-29 DIAGNOSIS — R296 Repeated falls: Secondary | ICD-10-CM | POA: Diagnosis not present

## 2021-08-29 DIAGNOSIS — E1121 Type 2 diabetes mellitus with diabetic nephropathy: Secondary | ICD-10-CM

## 2021-08-29 DIAGNOSIS — K580 Irritable bowel syndrome with diarrhea: Secondary | ICD-10-CM

## 2021-08-29 DIAGNOSIS — I10 Essential (primary) hypertension: Secondary | ICD-10-CM

## 2021-08-29 DIAGNOSIS — F32A Depression, unspecified: Secondary | ICD-10-CM

## 2021-08-29 NOTE — Progress Notes (Signed)
Location:  [facility] Room Number: N26/A Place of Service:  SNF [phone]) Provider: Yvonna Alanis, NP  Patient Care Team: Virgie Dad, MD as PCP - General (Internal Medicine) [name], MD (Ophthalmology) [name], MD as Consulting Physician (General Surgery) [name], MD as Consulting Physician (Internal Medicine)  Extended Emergency Contact Information Primary Emergency Contact: [name],[name] Address: [place] [address] [place] Phone: [phone] Mobile Phone: [phone] Relation: Spouse  Code Status:  DNR Goals of care: Advanced Directive information Advanced Directives 08/29/2021  Does Patient Have a Medical Advance Directive? Yes  Type of Paramedic of [place];Living will;Out of facility DNR (pink MOST or yellow form)  Does patient want to make changes to medical advance directive? No - Patient declined  Copy of [place] in Chart? No - copy requested  Pre-existing out of facility DNR order (yellow form or pink MOST form) Yellow form placed in chart (order not valid for inpatient use);Pink MOST form placed in chart (order not valid for inpatient use)     Chief Complaint  Patient presents with   Acute Visit    Vaginal itching    HPI:  Pt is a 81 y.o. female seen today for an acute visit for vaginal itching  She reports increased vaginal itching within the last 24 hours. She is a poor historian due to dementia. She is incontinent and wears briefs daily.  Dementia- followed by hospice, MMSE 25/30 2021, diagnosed with neurodegenerative disorder per neurology, poor safety awareness, no behavioral outbursts Frequent falls- 4 falls in January 2023- no apparent injury, blue mats in room, she would like more PT but cannot at this time due to hospice T2DM- A1c 6.0 07/22/2021, sugars averaging < 150, no hypoglycemic events, foot exam done today, remains  on asa, glipizide and Invokana HTN- no recent lab work, remains on metoprolol Peripheral neuropathy- remains on gabapentin  Insomnia- sleeping well through night, remains on temazepam prn Urge incontinence- remains on Ditropan IBS- no recent episodes, rifaximin discontinued  Depression- no mood changes, very pleasant today, remains on Zoloft Allergies- stable with Allegra, Flonase and Singulair daily     Past Medical History:  Diagnosis Date   Allergy    [name]; allergy shots weekly.     Anxiety    Arthritis    DDD lumbar spine.     Cancer ([facility])    breast L x 2; skin basal cell carcinoma [name]).   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   Past Surgical History:  Procedure Laterality Date   BREAST LUMPECTOMY  1996   L breast cancer   BREAST SURGERY N/A    Phreesia 12/12/2019   EYE SURGERY N/A    Phreesia 12/12/2019   MASTECTOMY  2002   Bilateral for breast cancer   VAGINAL HYSTERECTOMY  1985   DUB; uterine fibroids; ovaries intact.    Allergies  Allergen Reactions   Fruit & Vegetable Daily [Nutritional Supplements] Diarrhea   Onion Other (See Comments)    indigestion   Tree Extract Swelling   Sulfa Drugs Cross Reactors Rash    All over the body    Outpatient Encounter Medications as  of 08/29/2021  Medication Sig   aspirin 81 MG tablet Take 81 mg by mouth daily.   fexofenadine (ALLEGRA) 180 MG tablet Take 180 mg by mouth daily.   fluticasone (FLONASE) 50 MCG/ACT nasal spray Place 1 spray into both nostrils daily.   gabapentin (NEURONTIN) 100 MG capsule Take 200 mg by mouth 2 (two) times daily.   glipiZIDE (GLUCOTROL XL) 5 MG 24 hr tablet TAKE 1 TABLET EVERY DAY WITH BREAKFAST.   ibuprofen (ADVIL) 200 MG tablet Take 200 mg by mouth every 4 (four) hours as  needed.   INVOKANA 100 MG TABS tablet TAKE 1 TABLET ONCE DAILY.   ketotifen (ZADITOR) 0.025 % ophthalmic solution Place 1 drop into both eyes 2 (two) times daily.   montelukast (SINGULAIR) 10 MG tablet Take 10 mg by mouth every morning.   sertraline (ZOLOFT) 50 MG tablet Take 50 mg by mouth daily.   tolterodine (DETROL LA) 4 MG 24 hr capsule Take 4 mg by mouth daily.   TOPROL XL 100 MG 24 hr tablet TAKE 1 TABLET TWICE DAILY WITH FOOD.   zinc oxide 20 % ointment Apply 1 application topically as needed for irritation.   [DISCONTINUED] temazepam (RESTORIL) 7.5 MG capsule Take 7.5 mg by mouth at bedtime as needed for sleep. (Patient not taking: Reported on 08/29/2021)   No facility-administered encounter medications on file as of 08/29/2021.    Review of Systems  Unable to perform ROS: Dementia   Immunization History  Administered Date(s) Administered   Influenza Split 03/19/2012, 03/19/2013   Influenza, High Dose Seasonal PF 03/29/2018   Influenza,inj,Quad PF,6+ Mos 04/09/2014, 03/22/2015, 03/24/2016   Influenza-Unspecified 04/09/2017, 03/24/2018, 04/16/2020   Moderna Sars-Covid-2 Vaccination 05/19/2021   PFIZER(Purple Top)SARS-COV-2 Vaccination 08/17/2019, 09/10/2019, 04/06/2020, 11/19/2020   Pfizer Covid-19 Vaccine Bivalent Booster 86yrs & up 11/18/2020   Pneumococcal Conjugate-13 11/05/2014   Pneumococcal Polysaccharide-23 07/10/2004, 12/12/2010, 05/02/2016   Pneumococcal-Unspecified 07/10/2004, 12/12/2010, 05/02/2016   Td 07/10/2009   Zoster Recombinat (Shingrix) 04/09/2017, 07/19/2017   Zoster, Live 07/10/2006   Pertinent  Health Maintenance Due  Topic Date Due   OPHTHALMOLOGY EXAM  11/29/2017   URINE MICROALBUMIN  05/17/2019   INFLUENZA VACCINE  02/07/2021   HEMOGLOBIN A1C  01/19/2022   FOOT EXAM  08/24/2022   DEXA SCAN  Completed   Fall Risk 12/23/2018 09/30/2019 12/15/2019 12/25/2019 09/18/2020  Falls in the past year? 0 1 0 0 -  Was there an injury with Fall? 0 0 - 0 -  Fall  Risk Category Calculator 0 1 - 0 -  Fall Risk Category Low Low - Low -  Patient Fall Risk Level - - - Low fall risk Low fall risk  Patient at Risk for Falls Due to - - - - -  Patient at Risk for Falls Due to - - - - -  Fall risk Follow up - - Falls evaluation completed Falls evaluation completed;Education provided -   Functional Status Survey:    Vitals:   08/29/21 1156  BP: 137/88  Pulse: 72  Resp: 19  Temp: (!) 97.4 F (36.3 C)  SpO2: 95%  Weight: 154 lb 1.6 oz (69.9 kg)  Height: 5\' 4"  (1.626 m)   Body mass index is 26.45 kg/m. Physical Exam Vitals reviewed. Exam conducted with a chaperone present.  Constitutional:      General: She is not in acute distress. Eyes:     General:        Right eye: No discharge.  Left eye: No discharge.  Cardiovascular:     Rate and Rhythm: Normal rate and regular rhythm.     Pulses: Normal pulses.     Heart sounds: Normal heart sounds.  Pulmonary:     Effort: Pulmonary effort is normal. No respiratory distress.     Breath sounds: Normal breath sounds. No wheezing.  Abdominal:     General: Bowel sounds are normal. There is no distension.     Palpations: Abdomen is soft.     Tenderness: There is no abdominal tenderness.  Genitourinary:    Comments: Increased redness and white discharge over labia minora and vaginal opening.  Musculoskeletal:     Cervical back: Neck supple.  Neurological:     Mental Status: She is alert.    Labs reviewed: No results for input(s): NA, K, CL, CO2, GLUCOSE, BUN, CREATININE, CALCIUM, MG, PHOS in the last 8760 hours. No results for input(s): AST, ALT, ALKPHOS, BILITOT, PROT, ALBUMIN in the last 8760 hours. No results for input(s): WBC, NEUTROABS, HGB, HCT, MCV, PLT in the last 8760 hours. Lab Results  Component Value Date   TSH 3.100 08/12/2019   Lab Results  Component Value Date   HGBA1C 6.0 07/22/2021   Lab Results  Component Value Date   CHOL 158 12/19/2018   HDL 36 (L) 12/19/2018    LDLCALC 68 12/19/2018   TRIG 272 (H) 12/19/2018   CHOLHDL 4.4 12/19/2018    Significant Diagnostic Results in last 30 days:  No results found.  Assessment/Plan 1. Yeast infection - increased redness to labia minora and vaginal opening - start diflucan 150 mg po Q72 hrs x 2 doses  2. Moderate dementia without behavioral disturbance, psychotic disturbance, mood disturbance, or anxiety, unspecified dementia type - followed by hospice - no behavioral outbursts - cont skilled nursing care  3. Frequent falls - cont fall blue mats and falls safety precautions  4. Controlled type 2 diabetes mellitus with diabetic nephropathy, without long-term current use of insulin (HCC) - A1c stable - cont asa, glipizide and Invokana  5. Primary hypertension - controlled without medication  6. Peripheral polyneuropathy - stable with gabapentin  7. Insomnia, unspecified type - sleeping throughout night - cont temazepam  8. Urge incontinence - cont Detrol LA  9. Irritable bowel syndrome with diarrhea - no recent episodes - rifaximin discontinued  10. Depression, unspecified depression type - no mood changes - con Zoloft  11. Seasonal allergies - stable with Allegra, Flonase and Singulair    Family/ staff Communication: plan discussed with patient and nurse  Labs/tests ordered:  none

## 2021-09-22 ENCOUNTER — Encounter: Payer: Self-pay | Admitting: Internal Medicine

## 2021-09-22 ENCOUNTER — Non-Acute Institutional Stay (SKILLED_NURSING_FACILITY): Payer: Medicare Other | Admitting: Internal Medicine

## 2021-09-22 DIAGNOSIS — I10 Essential (primary) hypertension: Secondary | ICD-10-CM

## 2021-09-22 DIAGNOSIS — E1121 Type 2 diabetes mellitus with diabetic nephropathy: Secondary | ICD-10-CM | POA: Diagnosis not present

## 2021-09-22 DIAGNOSIS — G629 Polyneuropathy, unspecified: Secondary | ICD-10-CM | POA: Diagnosis not present

## 2021-09-22 DIAGNOSIS — F03B0 Unspecified dementia, moderate, without behavioral disturbance, psychotic disturbance, mood disturbance, and anxiety: Secondary | ICD-10-CM

## 2021-09-22 DIAGNOSIS — N3941 Urge incontinence: Secondary | ICD-10-CM

## 2021-09-22 DIAGNOSIS — F32A Depression, unspecified: Secondary | ICD-10-CM

## 2021-09-22 NOTE — Progress Notes (Signed)
?Location:   [facility] Room Number: 26 ?Place of Service:  SNF (31) ?Provider:  Veleta Miners MD ? ?Virgie Dad, MD ? ?Patient Care Team: ?Virgie Dad, MD as PCP - General (Internal Medicine) ?[name], MD (Ophthalmology) ?[name], MD as Consulting Physician (General Surgery) ?[name], MD as Consulting Physician (Internal Medicine) ? ?Extended Emergency Contact Information ?Primary Emergency Contact: [name],[name] ?Address: [address] ?         [place] [address] [place] ?Home Phone: [phone] ?Mobile Phone: [phone] ?Relation: Spouse ? ?Code Status:  DNR Hospice Managed Care ?Goals of care: Advanced Directive information ?Advanced Directives 09/22/2021  ?Does Patient Have a Medical Advance Directive? Yes  ?Type of Paramedic of [place];Living will;Out of facility DNR (pink MOST or yellow form)  ?Does patient want to make changes to medical advance directive? No - Patient declined  ?Copy of [place] in Chart? No - copy requested  ?Pre-existing out of facility DNR order (yellow form or pink MOST form) Yellow form placed in chart (order not valid for inpatient use);Pink MOST form placed in chart (order not valid for inpatient use)  ? ? ? ?Chief Complaint  ?Patient presents with  ? Medical Management of Chronic Issues  ? Quality Metric Gaps  ?  Verified [facility] and [facility] patient is due for eye exam, urine microalbumin, TDAP and flu shot.   ? ? ?HPI:  ?Pt is a 81 y.o. female seen today for medical management of chronic diseases.   ? ?Patient has h/o  ?Type 2 Diabetes Mellitus  ?On Multiple Meds.  ?Neurodegenerative Dementia ?Seen Neurology in 03/21 ?MMSE 25/30 ?MRI Moderate atrophy. Mild progression of white matter changes most ?likely due to chronic microvascular ischemia ?Recent decline Per Palliative care needing help with her ADLS. Husband has decided Hospice care ?Urinary  Incontinence ?HTN ?Insmonia, And Neuropathy ? ?Now in SNF ?Very pleasant  ?Has Mild Aphasia and Cognitive impairment ?She is stable. No new Nursing issues. No Behavior issues ?Mostly Wheelchair dependent ?No Falls ?Wt Readings from Last 3 Encounters:  ?09/22/21 160 lb 1.6 oz (72.6 kg)  ?08/29/21 154 lb 1.6 oz (69.9 kg)  ?08/24/21 154 lb 1.6 oz (69.9 kg)  ? ? ?Past Medical History:  ?Diagnosis Date  ? Allergy   ? [name]; allergy shots weekly.    ? Anxiety   ? Arthritis   ? DDD lumbar spine.    ? Cancer [facility])   ? breast L x 2; skin basal cell carcinoma [name]).  ? Clotting disorder ([place])   ? 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 Diabetes mellitus   ? Diabetes mellitus without complication ([place])   ? Phreesia 12/12/2019  ? Hypertension   ? 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  ? ?Past Surgical History:  ?Procedure Laterality Date  ? BREAST LUMPECTOMY  1996  ? L breast cancer  ? BREAST SURGERY N/A   ? [employer]reesia 12/12/2019  ? EYE SURGERY N/A   ? Phreesia 12/12/2019  ? MASTECTOMY  2002  ? Bilateral for breast cancer  ? VAGINAL HYSTERECTOMY  1985  ? DUB; uterine fibroids; ovaries intact.  ? ? ?Allergies  ?Allergen Reactions  ? Fruit & Vegetable Daily [Nutritional Supplements] Diarrhea  ? Onion Other (See Comments)  ?  indigestion  ? Tree Extract Swelling  ? Sulfa Drugs Cross Reactors Rash  ?  All over the body  ? ? ?Allergies as  of 09/22/2021   ? ?   Reactions  ? Fruit & Vegetable Daily [nutritional Supplements] Diarrhea  ? Onion Other (See Comments)  ? indigestion  ? Tree Extract Swelling  ? Sulfa Drugs Cross Reactors Rash  ? All over the body  ? ?  ? ?  ?Medication List  ?  ? ?  ? Accurate as of September 22, 2021  4:27 PM. If you have any questions, ask your nurse or doctor.  ?  ?  ? ?  ? ?aspirin 81 MG tablet ?Take 81 mg by mouth daily. ?  ?fexofenadine 180 MG tablet ?Commonly known as: ALLEGRA ?Take 180 mg by mouth daily. ?  ?fluticasone 50 MCG/ACT nasal spray ?Commonly known as:  FLONASE ?Place 1 spray into both nostrils daily. ?  ?gabapentin 100 MG capsule ?Commonly known as: NEURONTIN ?Take 200 mg by mouth 2 (two) times daily. ?  ?glipiZIDE 5 MG 24 hr tablet ?Commonly known as: GLUCOTROL XL ?TAKE 1 TABLET EVERY DAY WITH BREAKFAST. ?  ?ibuprofen 200 MG tablet ?Commonly known as: ADVIL ?Take 200 mg by mouth every 4 (four) hours as needed. ?  ?Invokana 100 MG Tabs tablet ?Generic drug: canagliflozin ?TAKE 1 TABLET ONCE DAILY. ?  ?ketotifen 0.025 % ophthalmic solution ?Commonly known as: ZADITOR ?Place 1 drop into both eyes 2 (two) times daily. ?  ?montelukast 10 MG tablet ?Commonly known as: SINGULAIR ?Take 10 mg by mouth every morning. ?  ?sertraline 50 MG tablet ?Commonly known as: ZOLOFT ?Take 50 mg by mouth daily. ?  ?tolterodine 4 MG 24 hr capsule ?Commonly known as: DETROL LA ?Take 4 mg by mouth daily. ?  ?Toprol XL 100 MG 24 hr tablet ?Generic drug: metoprolol succinate ?TAKE 1 TABLET TWICE DAILY WITH FOOD. ?  ?zinc oxide 20 % ointment ?Apply 1 application topically as needed for irritation. ?  ? ?  ? ? ?Review of Systems  ?Constitutional:  Negative for activity change and appetite change.  ?HENT: Negative.    ?Respiratory:  Negative for cough and shortness of breath.   ?Cardiovascular:  Negative for leg swelling.  ?Gastrointestinal:  Negative for constipation.  ?Genitourinary: Negative.   ?Musculoskeletal:  Positive for gait problem. Negative for arthralgias and myalgias.  ?Skin: Negative.   ?Neurological:  Positive for weakness. Negative for dizziness.  ?Psychiatric/Behavioral:  Positive for confusion. Negative for dysphoric mood and sleep disturbance.   ? ?Immunization History  ?Administered Date(s) Administered  ? Influenza Split 03/19/2012, 03/19/2013  ? Influenza, High Dose Seasonal PF 03/29/2018  ? Influenza,inj,Quad PF,6+ Mos 04/09/2014, 03/22/2015, 03/24/2016  ? Influenza-Unspecified 04/09/2017, 03/24/2018, 04/16/2020  ? Moderna Sars-Covid-2 Vaccination 05/19/2021  ?  PFIZER(Purple Top)SARS-COV-2 Vaccination 08/17/2019, 09/10/2019, 04/06/2020, 11/19/2020  ? Pension scheme manager 68yr & up 11/18/2020  ? Pneumococcal Conjugate-13 11/05/2014  ? Pneumococcal Polysaccharide-23 07/10/2004, 12/12/2010, 05/02/2016  ? Pneumococcal-Unspecified 07/10/2004, 12/12/2010, 05/02/2016  ? Td 07/10/2009  ? Zoster Recombinat (Shingrix) 04/09/2017, 07/19/2017  ? Zoster, Live 07/10/2006  ? ?Pertinent  Health Maintenance Due  ?Topic Date Due  ? OPHTHALMOLOGY EXAM  11/29/2017  ? URINE MICROALBUMIN  05/17/2019  ? INFLUENZA VACCINE  02/07/2021  ? HEMOGLOBIN A1C  01/19/2022  ? FOOT EXAM  08/24/2022  ? DEXA SCAN  Completed  ? ?Fall Risk 12/23/2018 09/30/2019 12/15/2019 12/25/2019 09/18/2020  ?Falls in the past year? 0 1 0 0 -  ?Was there an injury with Fall? 0 0 - 0 -  ?Fall Risk Category Calculator 0 1 - 0 -  ?Fall Risk Category Low  Low - Low -  ?Patient Fall Risk Level - - - Low fall risk Low fall risk  ?Patient at Risk for Falls Due to - - - - -  ?Patient at Risk for Falls Due to - - - - -  ?Fall risk Follow up - - Falls evaluation completed Falls evaluation completed;Education provided -  ? ?Functional Status Survey: ?  ? ?Vitals:  ? 09/22/21 1623  ?BP: 124/67  ?Pulse: 74  ?Resp: 20  ?Temp: 97.8 ?F (36.6 ?C)  ?SpO2: 95%  ?Weight: 160 lb 1.6 oz (72.6 kg)  ?Height: '5\' 4"'$  (1.626 m)  ? ?Body mass index is 27.48 kg/m?Marland Kitchen ?Physical Exam ?Vitals reviewed.  ?Constitutional:   ?   Appearance: Normal appearance.  ?HENT:  ?   Head: Normocephalic.  ?   Nose: Nose normal.  ?   Mouth/Throat:  ?   Mouth: Mucous membranes are moist.  ?   Pharynx: Oropharynx is clear.  ?Eyes:  ?   Pupils: Pupils are equal, round, and reactive to light.  ?Cardiovascular:  ?   Rate and Rhythm: Normal rate and regular rhythm.  ?   Pulses: Normal pulses.  ?   Heart sounds: Normal heart sounds. No murmur heard. ?Pulmonary:  ?   Effort: Pulmonary effort is normal.  ?   Breath sounds: Normal breath sounds.  ?Abdominal:  ?    General: Abdomen is flat. Bowel sounds are normal.  ?   Palpations: Abdomen is soft.  ?Musculoskeletal:     ?   General: No swelling.  ?   Cervical back: Neck supple.  ?Skin: ?   General: Skin is warm.  ?Neurological:  ?   General: No focal deficit

## 2021-09-26 LAB — HEPATIC FUNCTION PANEL
ALT: 10 U/L (ref 7–35)
AST: 16 (ref 13–35)
Alkaline Phosphatase: 76 (ref 25–125)
Bilirubin, Total: 0.4

## 2021-09-26 LAB — CBC AND DIFFERENTIAL
HCT: 41 (ref 36–46)
Hemoglobin: 13.6 (ref 12.0–16.0)
Neutrophils Absolute: 5909
Platelets: 306 10*3/uL (ref 150–400)
WBC: 9.5

## 2021-09-26 LAB — BASIC METABOLIC PANEL
BUN: 22 — AB (ref 4–21)
CO2: 20 (ref 13–22)
Chloride: 111 — AB (ref 99–108)
Creatinine: 1.1 (ref 0.5–1.1)
Glucose: 124
Potassium: 4.7 mEq/L (ref 3.5–5.1)
Sodium: 140 (ref 137–147)

## 2021-09-26 LAB — COMPREHENSIVE METABOLIC PANEL
Albumin: 3.6 (ref 3.5–5.0)
Calcium: 8.8 (ref 8.7–10.7)
Globulin: 2.1
eGFR: 49

## 2021-09-26 LAB — CBC: RBC: 5.05 (ref 3.87–5.11)

## 2021-10-19 ENCOUNTER — Non-Acute Institutional Stay (SKILLED_NURSING_FACILITY): Payer: Medicare PPO | Admitting: Orthopedic Surgery

## 2021-10-19 ENCOUNTER — Encounter: Payer: Self-pay | Admitting: Orthopedic Surgery

## 2021-10-19 DIAGNOSIS — E1121 Type 2 diabetes mellitus with diabetic nephropathy: Secondary | ICD-10-CM

## 2021-10-19 DIAGNOSIS — R296 Repeated falls: Secondary | ICD-10-CM | POA: Diagnosis not present

## 2021-10-19 DIAGNOSIS — I10 Essential (primary) hypertension: Secondary | ICD-10-CM

## 2021-10-19 DIAGNOSIS — G629 Polyneuropathy, unspecified: Secondary | ICD-10-CM

## 2021-10-19 DIAGNOSIS — F03B0 Unspecified dementia, moderate, without behavioral disturbance, psychotic disturbance, mood disturbance, and anxiety: Secondary | ICD-10-CM

## 2021-10-19 DIAGNOSIS — J302 Other seasonal allergic rhinitis: Secondary | ICD-10-CM

## 2021-10-19 DIAGNOSIS — F32A Depression, unspecified: Secondary | ICD-10-CM

## 2021-10-19 DIAGNOSIS — N3941 Urge incontinence: Secondary | ICD-10-CM

## 2021-10-19 DIAGNOSIS — K580 Irritable bowel syndrome with diarrhea: Secondary | ICD-10-CM

## 2021-10-19 DIAGNOSIS — G4700 Insomnia, unspecified: Secondary | ICD-10-CM

## 2021-10-19 NOTE — Progress Notes (Signed)
?Location:  [facility] Room Number: N26/A ?Place of Service:  SNF (31) ?Provider: Yvonna Alanis, NP ? ? ?Patient Care Team: ?Virgie Dad, MD as PCP - General (Internal Medicine) ?[name], MD (Ophthalmology) ?[name], MD as Consulting Physician (General Surgery) ?[name], MD as Consulting Physician (Internal Medicine) ? ?Extended Emergency Contact Information ?Primary Emergency Contact: [name],[name] ?Address: [place] ?         [place] [address] [place] ?Home Phone: [phone] ?Mobile Phone: [phone] ?Relation: Spouse ? ?Code Status:  DNR ?Goals of care: Advanced Directive information ? ?  10/19/2021  ? 10:46 AM  ?Advanced Directives  ?Does Patient Have a Medical Advance Directive? Yes  ?Type of Paramedic of [place];Living will;Out of facility DNR (pink MOST or yellow form)  ?Does patient want to make changes to medical advance directive? No - Patient declined  ?Copy of [place] in Chart? No - copy requested  ?Pre-existing out of facility DNR order (yellow form or pink MOST form) Yellow form placed in chart (order not valid for inpatient use);Pink MOST form placed in chart (order not valid for inpatient use)  ? ? ? ?Chief Complaint  ?Patient presents with  ? Medical Management of Chronic Issues  ?  Routine visit.  ? Quality Metric Gaps  ?  Discuss the need for eye exam, urine microalbumin, and TDAP, or post pone if patient refuses.   ? ? ?HPI:  ?Pt is a 81 y.o. female seen today for medical management of chronic diseases.   ? ? ?Dementia- followed by hospice, adjusted well to SNF, MMSE 25/30 2021, diagnosed with neurodegenerative disorder per neurology, poor safety awareness, no behavioral outbursts ?Seasonal allergies- reports increased nasal congestion and itching eyes, remains on Allegra, Flonase and Singulair daily ?Frequent falls- 2 falls without injury this month- slid off wheelchair, blue  mats in room, continues to ask for more PT but cannot at this time due to hospice ?T2DM- A1c 6.0 07/22/2021, sugars averaging < 150, no hypoglycemic events, remains on asa, glipizide and Invokana ?HTN- BUN/creat 22/1.1 09/26/2021, remains on metoprolol ?Peripheral neuropathy- remains on gabapentin  ?Insomnia- off temazepam  ?Urge incontinence- remains on Ditropan ?IBS- 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 ?Recent blood pressures: ? 04/11- 134/78 ? 04/04- 140/68, 126/67 ? 04/03- 120/62 ? ?Recent weights: ? 04/04- 161.5 lbs ? 03/01- 160.1 lbs ? 02/01- 154.1 lbs ? ?Past Surgical History:  ?Procedure Laterality Date  ? BREAST LUMPECTOMY  1996  ? L breast cancer  ? BREAST SURGERY N/A   ? [employer]reesia 12/12/2019  ? EYE SURGERY N/A   ? [employer]reesia 12/12/2019  ? MASTECTOMY  2002  ? Bilateral for breast cancer  ? VAGINAL HYSTERECTOMY  1985  ? DUB; uterine fibroids; ovaries intact.  ? ? ?Allergies  ?Allergen Reactions  ? Fruit & Vegetable Daily [Nutritional Supplements] Diarrhea  ? Onion Other (See Comments)  ?  indigestion  ? Tree Extract Swelling  ? Sulfa Drugs Cross Reactors Rash  ?  All over the body  ? ? ?Outpatient Encounter Medications as of 10/19/2021  ?Medication Sig  ? aspirin 81 MG tablet Take 81 mg by mouth daily.  ? fexofenadine (ALLEGRA) 180 MG tablet Take 180 mg by mouth daily.  ? fluticasone (FLONASE) 50 MCG/ACT nasal spray Place 1 spray into both nostrils daily.  ? gabapentin (NEURONTIN) 100 MG capsule Take 200 mg by mouth 2 (two)  times daily.  ? glipiZIDE (GLUCOTROL XL) 5 MG 24 hr tablet TAKE 1 TABLET EVERY DAY WITH BREAKFAST.  ? ibuprofen (ADVIL) 200 MG tablet Take 200 mg by mouth every 4 (four) hours as needed.  ? INVOKANA 100 MG TABS tablet TAKE 1 TABLET ONCE DAILY.  ? ketotifen (ZADITOR) 0.025 % ophthalmic solution Place 1 drop into both eyes 2 (two) times daily.  ? montelukast (SINGULAIR) 10 MG tablet Take 10 mg by mouth every morning.  ? sertraline  (ZOLOFT) 50 MG tablet Take 50 mg by mouth daily.  ? tolterodine (DETROL LA) 4 MG 24 hr capsule Take 4 mg by mouth daily.  ? TOPROL XL 100 MG 24 hr tablet TAKE 1 TABLET TWICE DAILY WITH FOOD.  ? zinc oxide 20 % ointment Apply 1 application topically as needed for irritation.  ? ?No facility-administered encounter medications on file as of 10/19/2021.  ? ? ?Review of Systems  ?Constitutional:  Negative for activity change, appetite change, chills, fatigue and fever.  ?HENT:  Positive for rhinorrhea. Negative for congestion, sore throat and trouble swallowing.   ?Eyes:  Negative for visual disturbance.  ?Respiratory:  Negative for cough, shortness of breath and wheezing.   ?Cardiovascular:  Negative for chest pain and leg swelling.  ?Gastrointestinal:  Negative for abdominal distention, abdominal pain, constipation, diarrhea, nausea and vomiting.  ?Endocrine: Negative for polydipsia, polyphagia and polyuria.  ?Genitourinary:  Negative for dysuria, frequency, hematuria and vaginal discharge.  ?Musculoskeletal:  Positive for gait problem.  ?Skin:  Negative for wound.  ?Neurological:  Positive for weakness. Negative for dizziness and headaches.  ?Psychiatric/Behavioral:  Positive for confusion, dysphoric mood and sleep disturbance. The patient is not nervous/anxious.   ? ?Immunization History  ?Administered Date(s) Administered  ? Influenza Split 03/19/2012, 03/19/2013  ? Influenza, High Dose Seasonal PF 03/29/2018  ? Influenza,inj,Quad PF,6+ Mos 04/09/2014, 03/22/2015, 03/24/2016  ? Influenza-Unspecified 04/09/2017, 03/24/2018, 04/16/2020  ? Moderna Sars-Covid-2 Vaccination 05/19/2021  ? PFIZER(Purple Top)SARS-COV-2 Vaccination 08/17/2019, 09/10/2019, 04/06/2020, 11/19/2020  ? Pension scheme manager 51yr & up 11/18/2020  ? Pneumococcal Conjugate-13 11/05/2014  ? Pneumococcal Polysaccharide-23 07/10/2004, 12/12/2010, 05/02/2016  ? Pneumococcal-Unspecified 07/10/2004, 12/12/2010, 05/02/2016  ? Td  07/10/2009  ? Zoster Recombinat (Shingrix) 04/09/2017, 07/19/2017  ? Zoster, Live 07/10/2006  ? ?Pertinent  Health Maintenance Due  ?Topic Date Due  ? OPHTHALMOLOGY EXAM  11/29/2017  ? URINE MICROALBUMIN  05/17/2019  ? HEMOGLOBIN A1C  01/19/2022  ? INFLUENZA VACCINE  02/07/2022  ? FOOT EXAM  08/24/2022  ? DEXA SCAN  Completed  ? ? ?  12/23/2018  ? 12:00 PM 09/30/2019  ?  1:24 PM 12/15/2019  ?  4:00 PM 12/25/2019  ?  2:02 PM 09/18/2020  ? 12:44 PM  ?Fall Risk  ?Falls in the past year? 0 1 0 0   ?Was there an injury with Fall? 0 0  0   ?Fall Risk Category Calculator 0 1  0   ?Fall Risk Category Low Low  Low   ?Patient Fall Risk Level    Low fall risk Low fall risk  ?Fall risk Follow up   Falls evaluation completed Falls evaluation completed;Education provided   ? ?Functional Status Survey: ?  ? ?Vitals:  ? 10/19/21 1041  ?BP: 134/78  ?Pulse: 78  ?Resp: 20  ?Temp: 97.8 ?F (36.6 ?C)  ?SpO2: 95%  ?Weight: 161 lb 8 oz (73.3 kg)  ?Height: '5\' 4"'$  (1.626 m)  ? ?Body mass index is 27.72 kg/m?.Marland Kitchen?Physical Exam ? ?Labs reviewed: ?Recent  Labs  ?  09/26/21 ?0000  ?NA 140  ?K 4.7  ?CL 111*  ?CO2 20  ?BUN 22*  ?CREATININE 1.1  ?CALCIUM 8.8  ? ?Recent Labs  ?  09/26/21 ?0000  ?AST 16  ?ALT 10  ?ALKPHOS 76  ?ALBUMIN 3.6  ? ?Recent Labs  ?  09/26/21 ?0000  ?WBC 9.5  ?NEUTROABS 5,909.00  ?HGB 13.6  ?HCT 41  ?PLT 306  ? ?Lab Results  ?Component Value Date  ? TSH 3.100 08/12/2019  ? ?Lab Results  ?Component Value Date  ? HGBA1C 6.0 07/22/2021  ? ?Lab Results  ?Component Value Date  ? CHOL 158 12/19/2018  ? HDL 36 (L) 12/19/2018  ? [place] 68 12/19/2018  ? TRIG 272 (H) 12/19/2018  ? CHOLHDL 4.4 12/19/2018  ? ? ?Significant Diagnostic Results in last 30 days:  ?No results found. ? ?Assessment/Plan ?1. Moderate dementia without behavioral disturbance, psychotic disturbance, mood disturbance, or anxiety, unspecified dementia type ([place]) ?- doing well in SNF ?- no behavioral outbursts ?- followed by Hospice ? ?2. Seasonal allergies ?- increased nasal  drainage and itching eyes ?- hold Allegra x 2 weeks and substitute with Claritin ?- cont Flonase and Singulair ? ?3. Frequent falls ?- ongoing, poor safety awareness ?- cont blue mats and falls safety precautions ?

## 2021-10-28 ENCOUNTER — Non-Acute Institutional Stay (SKILLED_NURSING_FACILITY): Payer: Medicare PPO | Admitting: Orthopedic Surgery

## 2021-10-28 ENCOUNTER — Encounter: Payer: Self-pay | Admitting: Orthopedic Surgery

## 2021-10-28 DIAGNOSIS — E1121 Type 2 diabetes mellitus with diabetic nephropathy: Secondary | ICD-10-CM

## 2021-10-28 DIAGNOSIS — I10 Essential (primary) hypertension: Secondary | ICD-10-CM | POA: Diagnosis not present

## 2021-10-28 DIAGNOSIS — F03B0 Unspecified dementia, moderate, without behavioral disturbance, psychotic disturbance, mood disturbance, and anxiety: Secondary | ICD-10-CM | POA: Diagnosis not present

## 2021-10-28 DIAGNOSIS — R296 Repeated falls: Secondary | ICD-10-CM | POA: Diagnosis not present

## 2021-10-28 NOTE — Progress Notes (Signed)
?Location:  [place] Room Number: N26/A ?Place of Service:  SNF (31) ?Provider:  Yvonna Alanis, NP ? ?Patient Care Team: ?Virgie Dad, MD as PCP - General (Internal Medicine) ?[name], MD (Ophthalmology) ?[name], MD as Consulting Physician (General Surgery) ?[name], MD as Consulting Physician (Internal Medicine) ? ?Extended Emergency Contact Information ?Primary Emergency Contact: [name],[name] ?Address: [place] ?         [place] [address] [place] ?Home Phone: [phone] ?Mobile Phone: [phone] ?Relation: Spouse ? ?Code Status:  DNR ?Goals of care: Advanced Directive information ? ?  10/28/2021  ? 11:13 AM  ?Advanced Directives  ?Does Patient Have a Medical Advance Directive? Yes  ?Type of Paramedic of [place];Living will;Out of facility DNR (pink MOST or yellow form)  ?Does patient want to make changes to medical advance directive? No - Patient declined  ?Copy of [place] in Chart? No - copy requested  ?Pre-existing out of facility DNR order (yellow form or pink MOST form) Yellow form placed in chart (order not valid for inpatient use);Pink MOST form placed in chart (order not valid for inpatient use)  ? ? ? ?Chief Complaint  ?Patient presents with  ? Acute Visit  ?  Frequent falls  ? ? ?HPI:  ?[name] [name] is a 81 y.o. female seen today for an acute visit for recurrent falls.  ? ? ?Frequent falls- multiple falls this month, 04/15 she slid off her wheelchair while trying to reach something- no apparent injury, blue mats in room, [name] [name]/OT evaluation  ?Dementia- recently taken off hospice 10/2021, MMSE 25/30 2021, 09/2019 MRI brain revealed  mild progression of white matter changes/chronic microvascular ischemia, diagnosed with neurodegenerative disorder per neurology, no behavioral outbursts ?T2DM- A1c 6.0 07/22/2021, sugars averaging 130-150's, no hypoglycemic events, remains on asa, glipizide and  Invokana ?HTN- see below, BUN/creat 22/1.1 09/26/2021, remains on metoprolol ? ?Recent blood pressure: ? 04/20- 121/76 ? 04/19- 115/70 ? 04/18- 97/66 ? ?  ?  ? ? ?Past Medical History:  ?Diagnosis Date  ? Allergy   ? [name]; allergy shots weekly.    ? Anxiety   ? Arthritis   ? DDD lumbar spine.    ? Cancer [facility])   ? breast L x 2; skin basal cell carcinoma [name]).  ? 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 Diabetes mellitus   ? Diabetes mellitus without complication ([place])   ? [employer]reesia 12/12/2019  ? Hypertension   ? 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  ? ?Past Surgical History:  ?Procedure Laterality Date  ? BREAST LUMPECTOMY  1996  ? L breast cancer  ? BREAST SURGERY N/A   ? [employer]rees[employer] 12/12/2019  ? EYE SURGERY N/A   ? [employer]reesia 12/12/2019  ? MASTECTOMY  2002  ? Bilateral for breast cancer  ? VAGINAL HYSTERECTOMY  1985  ? DUB; uterine fibroids; ovaries intact.  ? ? ?Allergies  ?Allergen Reactions  ? Fruit & Vegetable Daily [Nutritional Supplements] Diarrhea  ? Onion Other (See Comments)  ?  indigestion  ? Tree Extract Swelling  ? Sulfa Drugs Cross Reactors Rash  ?  All over the body  ? ? ?Outpatient Encounter Medications as of 10/28/2021  ?Medication Sig  ? aspirin 81 MG tablet Take 81 mg by mouth daily.  ? fexofenadine (ALLEGRA) 180 MG tablet Take 180 mg by mouth daily.  ? fluticasone (FLONASE) 50 MCG/ACT  nasal spray Place 1 spray into both nostrils daily.  ? gabapentin (NEURONTIN) 100 MG capsule Take 200 mg by mouth 2 (two) times daily.  ? glipiZIDE (GLUCOTROL XL) 5 MG 24 hr tablet TAKE 1 TABLET EVERY DAY WITH BREAKFAST.  ? ibuprofen (ADVIL) 200 MG tablet Take 200 mg by mouth every 4 (four) hours as needed.  ? INVOKANA 100 MG TABS tablet TAKE 1 TABLET ONCE DAILY.  ? loratadine (CLARITIN) 10 MG tablet Take 10 mg by mouth daily.  ? montelukast (SINGULAIR) 10 MG tablet Take 10 mg by mouth every morning.  ? sertraline (ZOLOFT) 50 MG tablet Take 50 mg by mouth  daily.  ? tolterodine (DETROL LA) 4 MG 24 hr capsule Take 4 mg by mouth daily.  ? TOPROL XL 100 MG 24 hr tablet TAKE 1 TABLET TWICE DAILY WITH FOOD.  ? zinc oxide 20 % ointment Apply 1 application topically as needed for irritation.  ? [DISCONTINUED] ketotifen (ZADITOR) 0.025 % ophthalmic solution Place 1 drop into both eyes 2 (two) times daily.  ? ?No facility-administered encounter medications on file as of 10/28/2021.  ? ? ?Review of Systems  ?Unable to perform ROS: Dementia  ? ?Immunization History  ?Administered Date(s) Administered  ? Influenza Split 03/19/2012, 03/19/2013  ? Influenza, High Dose Seasonal PF 03/29/2018  ? Influenza,inj,Quad PF,6+ Mos 04/09/2014, 03/22/2015, 03/24/2016  ? Influenza-Unspecified 04/09/2017, 03/24/2018, 04/16/2020  ? Moderna Sars-Covid-2 Vaccination 05/19/2021  ? PFIZER(Purple Top)SARS-COV-2 Vaccination 08/17/2019, 09/10/2019, 04/06/2020, 11/19/2020  ? Pension scheme manager 71yr & up 11/18/2020  ? Pneumococcal Conjugate-13 11/05/2014  ? Pneumococcal Polysaccharide-23 07/10/2004, 12/12/2010, 05/02/2016  ? Pneumococcal-Unspecified 07/10/2004, 12/12/2010, 05/02/2016  ? Td 07/10/2009  ? Zoster Recombinat (Shingrix) 04/09/2017, 07/19/2017  ? Zoster, Live 07/10/2006  ? ?Pertinent  Health Maintenance Due  ?Topic Date Due  ? OPHTHALMOLOGY EXAM  11/29/2017  ? URINE MICROALBUMIN  05/17/2019  ? HEMOGLOBIN A1C  01/19/2022  ? INFLUENZA VACCINE  02/07/2022  ? FOOT EXAM  08/24/2022  ? DEXA SCAN  Completed  ? ? ?  12/23/2018  ? 12:00 PM 09/30/2019  ?  1:24 PM 12/15/2019  ?  4:00 PM 12/25/2019  ?  2:02 PM 09/18/2020  ? 12:44 PM  ?Fall Risk  ?Falls in the past year? 0 1 0 0   ?Was there an injury with Fall? 0 0  0   ?Fall Risk Category Calculator 0 1  0   ?Fall Risk Category Low Low  Low   ?Patient Fall Risk Level    Low fall risk Low fall risk  ?Fall risk Follow up   Falls evaluation completed Falls evaluation completed;Education provided   ? ?Functional Status Survey: ?  ? ?Vitals:   ? 10/28/21 1105  ?BP: 121/76  ?Pulse: 80  ?Resp: 19  ?Temp: (!) 97 ?F (36.1 ?C)  ?SpO2: 95%  ?Weight: 161 lb 8 oz (73.3 kg)  ?Height: '5\' 4"'$  (1.626 m)  ? ?Body mass index is 27.72 kg/m?.Marland Kitchen?Physical Exam ?Vitals reviewed.  ?Constitutional:   ?   General: She is not in acute distress. ?HENT:  ?   Head: Normocephalic.  ?Eyes:  ?   General:     ?   Right eye: No discharge.     ?   Left eye: No discharge.  ?Cardiovascular:  ?   Rate and Rhythm: Normal rate and regular rhythm.  ?   Pulses: Normal pulses.  ?   Heart sounds: Normal heart sounds.  ?Pulmonary:  ?   Effort: Pulmonary effort  is normal. No respiratory distress.  ?   Breath sounds: Normal breath sounds. No wheezing.  ?Abdominal:  ?   General: Bowel sounds are normal. There is no distension.  ?   Palpations: Abdomen is soft.  ?   Tenderness: There is no abdominal tenderness.  ?Musculoskeletal:  ?   Cervical back: Neck supple.  ?   Right lower leg: No edema.  ?   Left lower leg: No edema.  ?Skin: ?   General: Skin is warm and dry.  ?   Capillary Refill: Capillary refill takes less than 2 seconds.  ?Neurological:  ?   General: No focal deficit present.  ?   Mental Status: She is alert. Mental status is at baseline.  ?   Motor: Weakness present.  ?   Gait: Gait abnormal.  ?   Comments: wheelchair  ?Psychiatric:     ?   Mood and Affect: Mood normal.     ?   Behavior: Behavior normal.     ?   Cognition and Memory: Memory is impaired.  ?   Comments: Very pleasant, follows commands, alert to self and person  ? ? ?Labs reviewed: ?Recent Labs  ?  09/26/21 ?0000  ?NA 140  ?K 4.7  ?CL 111*  ?CO2 20  ?BUN 22*  ?CREATININE 1.1  ?CALCIUM 8.8  ? ?Recent Labs  ?  09/26/21 ?0000  ?AST 16  ?ALT 10  ?ALKPHOS 76  ?ALBUMIN 3.6  ? ?Recent Labs  ?  09/26/21 ?0000  ?WBC 9.5  ?NEUTROABS 5,909.00  ?HGB 13.6  ?HCT 41  ?PLT 306  ? ?Lab Results  ?Component Value Date  ? TSH 3.100 08/12/2019  ? ?Lab Results  ?Component Value Date  ? HGBA1C 6.0 07/22/2021  ? ?Lab Results  ?Component Value Date   ? CHOL 158 12/19/2018  ? HDL 36 (L) 12/19/2018  ? [place] 68 12/19/2018  ? TRIG 272 (H) 12/19/2018  ? CHOLHDL 4.4 12/19/2018  ? ? ?Significant Diagnostic Results in last 30 days:  ?No results found. ? ?Assess

## 2021-11-08 ENCOUNTER — Encounter: Payer: Self-pay | Admitting: Internal Medicine

## 2021-11-08 ENCOUNTER — Non-Acute Institutional Stay (SKILLED_NURSING_FACILITY): Payer: Medicare PPO | Admitting: Internal Medicine

## 2021-11-08 DIAGNOSIS — F03B0 Unspecified dementia, moderate, without behavioral disturbance, psychotic disturbance, mood disturbance, and anxiety: Secondary | ICD-10-CM

## 2021-11-08 DIAGNOSIS — I10 Essential (primary) hypertension: Secondary | ICD-10-CM | POA: Diagnosis not present

## 2021-11-08 DIAGNOSIS — G629 Polyneuropathy, unspecified: Secondary | ICD-10-CM

## 2021-11-08 DIAGNOSIS — E1121 Type 2 diabetes mellitus with diabetic nephropathy: Secondary | ICD-10-CM | POA: Diagnosis not present

## 2021-11-08 DIAGNOSIS — R300 Dysuria: Secondary | ICD-10-CM

## 2021-11-08 NOTE — Progress Notes (Signed)
?Location:   [place] Room Number: 26 ?Place of Service:  SNF (31) ?Provider:  Veleta Miners MD ? ?[name] Dad, MD ? ?Patient Care Team: ?[name] Dad, MD as PCP - General (Internal Medicine) ?[name] Jacks, MD (Ophthalmology) ?[name] Skates, MD as Consulting Physician (General Surgery) ?[name] Kingdom, MD as Consulting Physician (Internal Medicine) ? ?Extended Emergency Contact Information ?Primary Emergency Contact: [name],[name] ?Address: [place] ?         [place] [address] [place] ?Home Phone: [phone] ?Mobile Phone: [phone] ?Relation: Spouse ? ?Code Status:  DNR Managed Care ?Goals of care: Advanced Directive information ? ?  11/08/2021  ? 10:30 AM  ?Advanced Directives  ?Does Patient Have a Medical Advance Directive? Yes  ?Type of Paramedic of [place];Living will;Out of facility DNR (pink MOST or yellow form)  ?Does patient want to make changes to medical advance directive? No - Patient declined  ?Copy of [place] in Chart? No - copy requested  ?Pre-existing out of facility DNR order (yellow form or pink MOST form) Yellow form placed in chart (order not valid for inpatient use);Pink MOST form placed in chart (order not valid for inpatient use)  ? ? ? ?Chief Complaint  ?Patient presents with  ? Acute Visit  ?  Dysuria  ? ? ?HPI:  ?Pt is a 81 y.o. female seen today for an acute visit for Dysuria ? ?Patient seen as she co Dysuria 'Hurts when I Pee.' ?No Fever or chills ?No [place] ?C/o Lower abdominal discomfort ? ? ? ?Patient has h/o  ?Type 2 Diabetes Mellitus  ?On Multiple Meds.  ?Neurodegenerative Dementia ?Seen Neurology in 03/21 ?MMSE 25/30 ?MRI Moderate atrophy. Mild progression of white matter changes most ?likely due to chronic microvascular ischemia ?Urinary Incontinence ?HTN ?Insmonia, And Neuropathy ? ?Past Medical History:  ?Diagnosis Date  ? Allergy   ? [name]; allergy shots weekly.    ?  Anxiety   ? Arthritis   ? DDD lumbar spine.    ? Cancer [facility])   ? breast L x 2; skin basal cell carcinoma [name]).  ? 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 Diabetes mellitus   ? Diabetes mellitus without complication ([place])   ? Phreesia 12/12/2019  ? Hypertension   ? 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  ? ?Past Surgical History:  ?Procedure Laterality Date  ? BREAST LUMPECTOMY  1996  ? L breast cancer  ? BREAST SURGERY N/A   ? [employer]reesia 12/12/2019  ? EYE SURGERY N/A   ? Phreesia 12/12/2019  ? MASTECTOMY  2002  ? Bilateral for breast cancer  ? VAGINAL HYSTERECTOMY  1985  ? DUB; uterine fibroids; ovaries intact.  ? ? ?Allergies  ?Allergen Reactions  ? Fruit & Vegetable Daily [Nutritional Supplements] Diarrhea  ? Onion Other (See Comments)  ?  indigestion  ? Tree Extract Swelling  ? Sulfa Drugs Cross Reactors Rash  ?  All over the body  ? ? ?Allergies as of 11/08/2021   ? ?   Reactions  ? Fruit & Vegetable Daily [nutritional Supplements] Diarrhea  ? Onion Other (See Comments)  ? indigestion  ? Tree Extract Swelling  ? Sulfa Drugs Cross Reactors Rash  ? All over the body  ? ?  ? ?  ?Medication List  ?  ? ?  ? Accurate as of Nov 08, 2021 10:31 AM. If you  have any questions, ask your nurse or doctor.  ?  ?  ? ?  ? ?STOP taking these medications   ? ?loratadine 10 MG tablet ?Commonly known as: CLARITIN ?Stopped by: [name] Dad, MD ?  ? ?  ? ?TAKE these medications   ? ?aspirin 81 MG tablet ?Take 81 mg by mouth daily. ?  ?fexofenadine 180 MG tablet ?Commonly known as: ALLEGRA ?Take 180 mg by mouth daily. ?  ?fluticasone 50 MCG/ACT nasal spray ?Commonly known as: FLONASE ?Place 1 spray into both nostrils daily. ?  ?gabapentin 100 MG capsule ?Commonly known as: NEURONTIN ?Take 200 mg by mouth 2 (two) times daily. ?  ?glipiZIDE 5 MG 24 hr tablet ?Commonly known as: GLUCOTROL XL ?TAKE 1 TABLET EVERY DAY WITH BREAKFAST. ?  ?ibuprofen 200 MG tablet ?Commonly known as:  ADVIL ?Take 200 mg by mouth every 4 (four) hours as needed. ?  ?Invokana 100 MG Tabs tablet ?Generic drug: canagliflozin ?TAKE 1 TABLET ONCE DAILY. ?  ?ketotifen 0.025 % ophthalmic solution ?Commonly known as: ZADITOR ?1 drop 2 (two) times daily as needed. ?  ?montelukast 10 MG tablet ?Commonly known as: SINGULAIR ?Take 10 mg by mouth every morning. ?  ?sertraline 50 MG tablet ?Commonly known as: ZOLOFT ?Take 50 mg by mouth daily. ?  ?tolterodine 4 MG 24 hr capsule ?Commonly known as: DETROL LA ?Take 4 mg by mouth daily. ?  ?Toprol XL 100 MG 24 hr tablet ?Generic drug: metoprolol succinate ?TAKE 1 TABLET TWICE DAILY WITH FOOD. ?  ?zinc oxide 20 % ointment ?Apply 1 application topically as needed for irritation. ?  ? ?  ? ? ?Review of Systems  ?Constitutional: Negative.   ?HENT: Negative.    ?Respiratory: Negative.    ?Cardiovascular: Negative.   ?Gastrointestinal: Negative.   ?Genitourinary:  Positive for dysuria, frequency, pelvic pain and urgency. Negative for hematuria.  ?Musculoskeletal:  Positive for gait problem.  ?Psychiatric/Behavioral:  Positive for confusion.   ? ?Immunization History  ?Administered Date(s) Administered  ? Influenza Split 03/19/2012, 03/19/2013  ? Influenza, High Dose Seasonal PF 03/29/2018  ? Influenza,inj,Quad PF,6+ Mos 04/09/2014, 03/22/2015, 03/24/2016  ? Influenza-Unspecified 04/09/2017, 03/24/2018, 04/16/2020  ? Moderna Sars-Covid-2 Vaccination 05/19/2021  ? PFIZER(Purple Top)SARS-COV-2 Vaccination 08/17/2019, 09/10/2019, 04/06/2020, 11/19/2020  ? Pension scheme manager 3yr & up 11/18/2020  ? Pneumococcal Conjugate-13 11/05/2014  ? Pneumococcal Polysaccharide-23 07/10/2004, 12/12/2010, 05/02/2016  ? Pneumococcal-Unspecified 07/10/2004, 12/12/2010, 05/02/2016  ? Td 07/10/2009  ? Zoster Recombinat (Shingrix) 04/09/2017, 07/19/2017  ? Zoster, Live 07/10/2006  ? ?Pertinent  Health Maintenance Due  ?Topic Date Due  ? OPHTHALMOLOGY EXAM  11/29/2017  ? URINE MICROALBUMIN   05/17/2019  ? HEMOGLOBIN A1C  01/19/2022  ? INFLUENZA VACCINE  02/07/2022  ? FOOT EXAM  08/24/2022  ? DEXA SCAN  Completed  ? ? ?  12/23/2018  ? 12:00 PM 09/30/2019  ?  1:24 PM 12/15/2019  ?  4:00 PM 12/25/2019  ?  2:02 PM 09/18/2020  ? 12:44 PM  ?Fall Risk  ?Falls in the past year? 0 1 0 0   ?Was there an injury with Fall? 0 0  0   ?Fall Risk Category Calculator 0 1  0   ?Fall Risk Category Low Low  Low   ?Patient Fall Risk Level    Low fall risk Low fall risk  ?Fall risk Follow up   Falls evaluation completed Falls evaluation completed;Education provided   ? ?Functional Status Survey: ?  ? ?Vitals:  ? 11/08/21 1023  ?  BP: 121/71  ?Pulse: 76  ?Resp: 20  ?Temp: (!) 97.2 ?F (36.2 ?C)  ?SpO2: 95%  ?Weight: 161 lb 8 oz (73.3 kg)  ?Height: '5\' 4"'$  (1.626 m)  ? ?Body mass index is 27.72 kg/m?Marland Kitchen ?Physical Exam ?Vitals reviewed.  ?Constitutional:   ?   Appearance: Normal appearance.  ?HENT:  ?   Head: Normocephalic.  ?   Nose: Nose normal.  ?   Mouth/Throat:  ?   Mouth: Mucous membranes are moist.  ?   Pharynx: Oropharynx is clear.  ?Eyes:  ?   Pupils: Pupils are equal, round, and reactive to light.  ?Cardiovascular:  ?   Rate and Rhythm: Normal rate and regular rhythm.  ?   Pulses: Normal pulses.  ?   Heart sounds: Normal heart sounds. No murmur heard. ?Pulmonary:  ?   Effort: Pulmonary effort is normal.  ?   Breath sounds: Normal breath sounds.  ?Abdominal:  ?   General: Abdomen is flat. Bowel sounds are normal. There is no distension.  ?   Palpations: Abdomen is soft.  ?   Tenderness: There is no abdominal tenderness.  ?Musculoskeletal:     ?   General: No swelling.  ?   Cervical back: Neck supple.  ?Skin: ?   General: Skin is warm.  ?Neurological:  ?   General: No focal deficit present.  ?   Mental Status: She is alert.  ?Psychiatric:     ?   Mood and Affect: Mood normal.     ?   Thought Content: Thought content normal.  ? ? ?Labs reviewed: ?Recent Labs  ?  09/26/21 ?0000  ?NA 140  ?K 4.7  ?CL 111*  ?CO2 20  ?BUN 22*   ?CREATININE 1.1  ?CALCIUM 8.8  ? ?Recent Labs  ?  09/26/21 ?0000  ?AST 16  ?ALT 10  ?ALKPHOS 76  ?ALBUMIN 3.6  ? ?Recent Labs  ?  09/26/21 ?0000  ?WBC 9.5  ?NEUTROABS 5,909.00  ?HGB 13.6  ?HCT 41  ?PLT 306  ? ?L

## 2021-11-11 ENCOUNTER — Non-Acute Institutional Stay: Payer: Medicare PPO | Admitting: Family Medicine

## 2021-11-11 ENCOUNTER — Encounter: Payer: Self-pay | Admitting: Family Medicine

## 2021-11-11 VITALS — BP 118/70 | HR 68 | Resp 18

## 2021-11-11 DIAGNOSIS — R300 Dysuria: Secondary | ICD-10-CM

## 2021-11-11 DIAGNOSIS — E1121 Type 2 diabetes mellitus with diabetic nephropathy: Secondary | ICD-10-CM

## 2021-11-11 NOTE — Progress Notes (Signed)
? ? ?Manufacturing engineer ?[facility] Consult Note ?Telephone: [phone]  ?Fax: [phone]  ? ?Date of encounter: 11/11/21 ?9:58 AM ?PATIENT NAME: [name] [name] ?[place] [address]   ?[phone] (home)  ?DOB: [birth year] ?MRN: [id number] ?PRIMARY CARE PROVIDER:    ?Virgie Dad, MD,  ?[address] ?[place] [address] ?[phone] ? ?REFERRING PROVIDER:   ?Virgie Dad, MD ?[address] ?[place],  [place] [address] ?[phone] ? ?RESPONSIBLE PARTY:    ?Contact Information   ? ? Name Relation Home Work Mobile  ? [name] Spouse [phone]  [phone]  ? ?  ? ? ? ?I met face to face with patient in [place] facility. Palliative Care was asked to follow this patient by consultation request of  Virgie Dad, MD to address advance care planning and complex medical decision making. This is an initial visit.  ? ? ?      ASSESSMENT, SYMPTOM MANAGEMENT AND PLAN / RECOMMENDATIONS:  ?Dysuria ?UA obtained previously and results pending. ?Agree with Pyridium for comfort. ?If following MOST and no plans to treat with antibiotics (may check with spouse given comfort issue), recommend cranberry supplement, increasing fluids and possible changes to diabetic regimen. ? ?Controlled diabetes with diabetic nephropathy ?Well controlled on current regimen. ?Recommend checking blood sugars more frequently on BID and prn schedule if plan to continue Glipizide due to potential for hypoglycemia. ?Would allow pt to run a little higher with kidney disease, stop Invokana which may decrease possible recurrence of UTI or yeast. ? ?Follow up Palliative Care Visit: Palliative care will continue to follow for complex medical decision making, advance care planning, and clarification of goals. Return 6 weeks or prn. ? ? ? ?This visit was coded based on medical decision making (MDM). ? ?PPS: 50% ? ?HOSPICE ELIGIBILITY/DIAGNOSIS: TBD ? ?Chief  Complaint: ?[facility] received a referral to follow up with patient for chronic disease management in setting of hx of breast cancer, DM and peripheral neuropathy.  Follow up is also for advance directive planning and defining/refining goals of care.  ? ?HISTORY OF PRESENT ILLNESS:  [name] [name] is a 81 y.o. year old female with hx of breast cancer, currently in remission after bilateral mastectomy and lymphadenectomy on left.  She has memory issues, DM with nephropathy, peripheral neuropathy, HTN, GERD, IBS, arthritis, osteopenia, DJD, non-melanoma skin cancer, heterozygous for Factor 5 Leiden mutation, dyslipidemia, insomnia and urge incontinence. She states being able to walk, sometimes falling out of her chair or "sitting on the floor" when trying to reach something in the drawer and is frustrated that "all these episodes are considered falls".  She states recently working with PT and ambulating 200 feet, has wheelchair. She currently c/o pain at end of urination, had recent UA and waiting on results. Denies fever, nausea and vomiting. Describes pain as a 4 on a 10 scale, denies hematuria.  Has urinary and bowel incontinence per nursing notes, requiring assist with bathing and dressing.  Denies constipation, has more issues with IBS with diarrhea.  Mood ok, trouble fallin asleep.  Spouse [name] is her spokesperson.  Has been in SNF almost 1 year.  No CP, SOB, orthopnea or PND. Denies hx of a clot. No smoke, occasional alcohol. No drug use. Spoke with Windell Moulding, NP at facility and she reports UA done 11/09/21 and results will not be available until probably Monday.  She will order Pyridium for pt comfort.  Pt was later seen ambulating with PT with assistance of gait belt.  Nurse states pt's blood sugars are "good" and are only checked once weekly. She is on Invokana and Glipizide daily with last HGB A1c in January 2023 of 6.0%. ? ?History obtained from review of EMR, discussion  with primary team, and interview with family, facility staff/caregiver and/or Ms. Stauber.  ?I reviewed available labs, medications, imaging, studies and related documents from the EMR.  Records reviewed and summarized above.  ? ?ROS ?General: NAD ?EYES: denies vision changes ?ENMT: denies dysphagia ?Cardiovascular: denies chest pain, denies DOE ?Pulmonary: denies cough, denies increased SOB ?Abdomen: endorses good appetite, denies constipation/has IBS with more frequent loose stools, endorses continence of bowel ?GU: endorses dysuria at end of voiding, endorses incontinence of urine ?MSK:  denies increased weakness, multiple falls reported, uses WC for mobility ?Skin: denies rashes or wounds ?Neurological: endorses SP pain at end of urination, endorses difficulty falling asleep ?Psych: Endorses "ok" mood ?Heme/lymph/immuno: denies bruises, abnormal bleeding ? ?Physical Exam: ?Current and past weights: 161 lbs 8 ounces as of 11/09/21 ?Constitutional: NAD ?General: WD obese  ?EYES: anicteric sclera, lids intact, no discharge  ?ENMT: intact hearing, oral mucous membranes moist, dentition intact ?CV: S1S2, RRR, no LE edema ?Pulmonary: CTAB, no increased work of breathing, no cough, room air ?Abdomen: normo-active BS + 4 quadrants, soft and non tender, no ascites ?GU: deferred ?MSK: no sarcopenia, moves all extremities, uses WC for mobility ?Skin: warm and dry, no rashes or wounds on visible skin ?Neuro:  no generalized weakness, no cognitive impairment ?Psych: non-anxious, blunted affect, A and O x 3 ?Hem/lymph/immuno: no widespread bruising ? ?CURRENT PROBLEM LIST:  ?Patient Active Problem List  ? Diagnosis Date Noted  ? Insomnia 12/23/2018  ? Urge incontinence 05/09/2017  ? Venous stasis 05/09/2017  ? Peripheral polyneuropathy 05/22/2016  ? Controlled diabetes mellitus with diabetic nephropathy, without long-term current use of insulin ([place]) 07/29/2015  ? IBS (irritable bowel syndrome) 12/01/2014  ? Vertical diplopia  11/17/2014  ? Non-melanoma skin cancer 05/13/2014  ? Seasonal allergies 09/12/2011  ? Anxiety 09/12/2011  ? HTN (hypertension) 09/12/2011  ? Breast cancer ([place]) 09/12/2011  ? GERD (gastroesophageal reflux disease) 09/12/2011  ? Dyslipidemia 09/12/2011  ? Osteopenia 09/12/2011  ? DJD (degenerative joint disease) 09/12/2011  ? Factor 5 Leiden mutation, heterozygous ([place]) 09/12/2011  ? History of colonic polyps 09/12/2011  ? ?PAST MEDICAL HISTORY:  ?Active Ambulatory Problems  ?  Diagnosis Date Noted  ? Seasonal allergies 09/12/2011  ? Anxiety 09/12/2011  ? HTN (hypertension) 09/12/2011  ? Breast cancer ([place]) 09/12/2011  ? GERD (gastroesophageal reflux disease) 09/12/2011  ? Dyslipidemia 09/12/2011  ? Osteopenia 09/12/2011  ? DJD (degenerative joint disease) 09/12/2011  ? Factor 5 Leiden mutation, heterozygous ([place]) 09/12/2011  ? History of colonic polyps 09/12/2011  ? Non-melanoma skin cancer 05/13/2014  ? Vertical diplopia 11/17/2014  ? IBS (irritable bowel syndrome) 12/01/2014  ? Controlled diabetes mellitus with diabetic nephropathy, without long-term current use of insulin ([place]) 07/29/2015  ? Peripheral polyneuropathy 05/22/2016  ? Urge incontinence 05/09/2017  ? Venous stasis 05/09/2017  ? Insomnia 12/23/2018  ? ?Resolved Ambulatory Problems  ?  Diagnosis Date Noted  ? Diabetes mellitus 09/12/2011  ? CRI (chronic renal insufficiency) 09/12/2011  ? ?Past Medical History:  ?Diagnosis Date  ? Allergy   ? Arthritis   ? Cancer [facility])   ? Clotting disorder ([place])   ? Diabetes mellitus   ? Diabetes mellitus without complication ([place])   ? Hypertension   ? ?  SOCIAL HX:  ?Social History  ? ?Tobacco Use  ? Smoking status: Never  ? Smokeless tobacco: Never  ?Substance Use Topics  ? Alcohol use: No  ?  Alcohol/week: 0.0 standard drinks  ?  Comment: rare  ? ?FAMILY HX:  ?Family History  ?Problem Relation Age of Onset  ? Heart disease Mother   ?     AMI as cause of death age 87.  ? Heart disease Father   ?     AMI as cause of  death.  ? Heart disease Brother   ?   ? ? ?Preferred Pharmacy: ?ALLERGIES:  ?Allergies  ?Allergen Reactions  ? Fruit & Vegetable Daily [Nutritional Supplements] Diarrhea  ? Onion Other (See Comments)  ?  indiges

## 2021-11-12 DIAGNOSIS — R300 Dysuria: Secondary | ICD-10-CM | POA: Insufficient documentation

## 2021-11-14 ENCOUNTER — Non-Acute Institutional Stay (SKILLED_NURSING_FACILITY): Payer: Medicare PPO | Admitting: Orthopedic Surgery

## 2021-11-14 ENCOUNTER — Encounter: Payer: Self-pay | Admitting: Orthopedic Surgery

## 2021-11-14 DIAGNOSIS — R300 Dysuria: Secondary | ICD-10-CM

## 2021-11-14 DIAGNOSIS — G629 Polyneuropathy, unspecified: Secondary | ICD-10-CM

## 2021-11-14 DIAGNOSIS — F03B0 Unspecified dementia, moderate, without behavioral disturbance, psychotic disturbance, mood disturbance, and anxiety: Secondary | ICD-10-CM | POA: Diagnosis not present

## 2021-11-14 DIAGNOSIS — N3941 Urge incontinence: Secondary | ICD-10-CM

## 2021-11-14 DIAGNOSIS — R296 Repeated falls: Secondary | ICD-10-CM

## 2021-11-14 DIAGNOSIS — E1121 Type 2 diabetes mellitus with diabetic nephropathy: Secondary | ICD-10-CM

## 2021-11-14 DIAGNOSIS — J302 Other seasonal allergic rhinitis: Secondary | ICD-10-CM

## 2021-11-14 DIAGNOSIS — I10 Essential (primary) hypertension: Secondary | ICD-10-CM

## 2021-11-14 DIAGNOSIS — F32A Depression, unspecified: Secondary | ICD-10-CM

## 2021-11-14 NOTE — Progress Notes (Signed)
?Location:  [name] [name] Room Number: N26/A ?Place of Service:  SNF (31) ?Provider: Yvonna Alanis, NP ? ?Patient Care Team: ?Virgie Dad, MD as PCP - General (Internal Medicine) ?[name], MD (Ophthalmology) ?[name], MD as Consulting Physician (General Surgery) ?[name], MD as Consulting Physician (Internal Medicine) ? ?Extended Emergency Contact Information ?Primary Emergency Contact: [name] [name] ?Address: [place] ?         [place] [address] [place] ?Home Phone: [phone] ?Mobile Phone: [phone] ?Relation: Spouse ? ?Code Status:  DNR ?Goals of care: Advanced Directive information ? ?  11/14/2021  ? 10:04 AM  ?Advanced Directives  ?Does Patient Have a Medical Advance Directive? Yes  ?Type of Paramedic of [place];Living will;Out of facility DNR (pink MOST or yellow form)  ?Does patient want to make changes to medical advance directive? No - Patient declined  ?Copy of [place] in Chart? Yes - validated most recent copy scanned in chart (See row information)  ?Pre-existing out of facility DNR order (yellow form or pink MOST form) Yellow form placed in chart (order not valid for inpatient use);Pink MOST form placed in chart (order not valid for inpatient use)  ? ? ? ?Chief Complaint  ?Patient presents with  ? Medical Management of Chronic Issues  ?  Routine visit.   ? Quality Metric Gaps  ?  Discuss the need for eye exam, urine Microalbumin, and TDAP, or post pone if patient refuses.   ? ? ?HPI:  ?Pt is a 81 y.o. female seen today for medical management of chronic diseases.   ? ?Dysuria- denies dysuria today, she was given pyridium over the weekend, urine culture suggested recollection, afebrile, denies flank pain or abdominal pain  ?Dementia- discharged from Hospice last month,now followed by palliative, MMSE 25/30 2021, diagnosed with neurodegenerative disorder per neurology, poor safety awareness,  no behavioral outbursts ?Seasonal allergies- remains on Allegra, Flonase and Singulair daily ?Frequent falls- no recent falls, working with PT ?T2DM- A1c 6.0 07/22/2021, sugars averaging < 150-170's, no hypoglycemic events, remains on asa, glipizide and Invokana ?HTN- BUN/creat 22/1.1 09/26/2021, remains on metoprolol ?Peripheral neuropathy- remains on gabapentin  ?Urge incontinence- remains on Ditropan ?Depression- no mood changes, remains on Zoloft ? ?Recent blood pressures: ? 05/02- 112/69 ? 05/01- 121/71, 113/72 ? 04/29- 129/75 ? ?Recent weights: ? 05/02- 160.1 lbs ? 04/04- 161.5 lbs ? 03/01- 160.1 lbs ? ? ? ?Past Medical History:  ?Diagnosis Date  ? Allergy   ? [name]; allergy shots weekly.    ? Anxiety   ? Arthritis   ? DDD lumbar spine.    ? Cancer [facility])   ? breast L x 2; skin basal cell carcinoma [name]).  ? Clotting disorder ([place])   ? 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 Diabetes mellitus   ? Diabetes mellitus without complication ([place])   ? [employer]reesia 12/12/2019  ? Hypertension   ? 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  ? ?Past Surgical History:  ?Procedure Laterality Date  ? BREAST LUMPECTOMY  1996  ? L breast cancer  ? BREAST SURGERY N/A   ? [employer]reesia 12/12/2019  ? EYE SURGERY N/A   ? [employer]reesia 12/12/2019  ? MASTECTOMY  2002  ? Bilateral for breast cancer  ? VAGINAL HYSTERECTOMY  1985  ? DUB; uterine fibroids; ovaries intact.  ? ? ?Allergies  ?Allergen Reactions  ? Fruit & Vegetable Daily [Nutritional Supplements] Diarrhea  ?  Onion Other (See Comments)  ?  indigestion  ? Tree Extract Swelling  ? Sulfa Drugs Cross Reactors Rash  ?  All over the body  ? ? ?Outpatient Encounter Medications as of 11/14/2021  ?Medication Sig  ? aspirin 81 MG tablet Take 81 mg by mouth daily.  ? fexofenadine (ALLEGRA) 180 MG tablet Take 180 mg by mouth daily.  ? fluticasone (FLONASE) 50 MCG/ACT nasal spray Place 1 spray into both nostrils daily.  ? gabapentin (NEURONTIN) 100 MG capsule Take 200 mg  by mouth 2 (two) times daily.  ? glipiZIDE (GLUCOTROL XL) 5 MG 24 hr tablet TAKE 1 TABLET EVERY DAY WITH BREAKFAST.  ? ibuprofen (ADVIL) 200 MG tablet Take 200 mg by mouth every 4 (four) hours as needed.  ? INVOKANA 100 MG TABS tablet TAKE 1 TABLET ONCE DAILY.  ? ketotifen (ZADITOR) 0.025 % ophthalmic solution 1 drop 2 (two) times daily as needed.  ? montelukast (SINGULAIR) 10 MG tablet Take 10 mg by mouth every morning.  ? sertraline (ZOLOFT) 50 MG tablet Take 50 mg by mouth daily.  ? tolterodine (DETROL LA) 4 MG 24 hr capsule Take 4 mg by mouth daily.  ? TOPROL XL 100 MG 24 hr tablet TAKE 1 TABLET TWICE DAILY WITH FOOD.  ? zinc oxide 20 % ointment Apply 1 application topically as needed for irritation.  ? ?No facility-administered encounter medications on file as of 11/14/2021.  ? ? ?Review of Systems  ?Constitutional:  Negative for activity change, appetite change, chills, fatigue and fever.  ?HENT:  Positive for rhinorrhea. Negative for trouble swallowing.   ?Eyes:  Negative for visual disturbance.  ?Respiratory:  Negative for cough, shortness of breath and wheezing.   ?Cardiovascular:  Negative for chest pain and leg swelling.  ?Gastrointestinal:  Positive for constipation. Negative for abdominal distention, abdominal pain, diarrhea, nausea and vomiting.  ?Genitourinary:  Negative for dysuria, frequency and hematuria.  ?Musculoskeletal:  Positive for arthralgias and gait problem.  ?Skin:  Negative for wound.  ?Neurological:  Positive for weakness. Negative for dizziness and headaches.  ?Psychiatric/Behavioral:  Positive for confusion and dysphoric mood. Negative for sleep disturbance. The patient is not nervous/anxious.   ? ?Immunization History  ?Administered Date(s) Administered  ? Influenza Split 03/19/2012, 03/19/2013  ? Influenza, High Dose Seasonal PF 03/29/2018  ? Influenza,inj,Quad PF,6+ Mos 04/09/2014, 03/22/2015, 03/24/2016  ? Influenza-Unspecified 04/09/2017, 03/24/2018, 04/16/2020  ? Moderna  Sars-Covid-2 Vaccination 05/19/2021  ? PFIZER(Purple Top)SARS-COV-2 Vaccination 08/17/2019, 09/10/2019, 04/06/2020, 11/19/2020  ? Pension scheme manager 64yr & up 11/18/2020  ? Pneumococcal Conjugate-13 11/05/2014  ? Pneumococcal Polysaccharide-23 07/10/2004, 12/12/2010, 05/02/2016  ? Pneumococcal-Unspecified 07/10/2004, 12/12/2010, 05/02/2016  ? Td 07/10/2009  ? Zoster Recombinat (Shingrix) 04/09/2017, 07/19/2017  ? Zoster, Live 07/10/2006  ? ?Pertinent  Health Maintenance Due  ?Topic Date Due  ? OPHTHALMOLOGY EXAM  11/29/2017  ? URINE MICROALBUMIN  05/17/2019  ? HEMOGLOBIN A1C  01/19/2022  ? INFLUENZA VACCINE  02/07/2022  ? FOOT EXAM  08/24/2022  ? DEXA SCAN  Completed  ? ? ?  12/23/2018  ? 12:00 PM 09/30/2019  ?  1:24 PM 12/15/2019  ?  4:00 PM 12/25/2019  ?  2:02 PM 09/18/2020  ? 12:44 PM  ?Fall Risk  ?Falls in the past year? 0 1 0 0   ?Was there an injury with Fall? 0 0  0   ?Fall Risk Category Calculator 0 1  0   ?Fall Risk Category Low Low  Low   ?Patient Fall Risk Level  Low fall risk Low fall risk  ?Fall risk Follow up   Falls evaluation completed Falls evaluation completed;Education provided   ? ?Functional Status Survey: ?  ? ?Vitals:  ? 11/14/21 1000  ?BP: 112/69  ?Pulse: 78  ?Resp: 16  ?Temp: (!) 97.4 ?F (36.3 ?C)  ?SpO2: 95%  ?Weight: 160 lb 1.6 oz (72.6 kg)  ?Height: '5\' 4"'$  (1.626 m)  ? ?Body mass index is 27.48 kg/m?Marland Kitchen ?Physical Exam ?Vitals reviewed.  ?Constitutional:   ?   General: She is not in acute distress. ?HENT:  ?   Head: Normocephalic.  ?   Right Ear: There is no impacted cerumen.  ?   Left Ear: There is no impacted cerumen.  ?   Nose: Nose normal.  ?   Mouth/Throat:  ?   Mouth: Mucous membranes are moist.  ?Eyes:  ?   General:     ?   Right eye: No discharge.     ?   Left eye: No discharge.  ?Neck:  ?   Vascular: No carotid bruit.  ?Cardiovascular:  ?   Rate and Rhythm: Normal rate and regular rhythm.  ?   Pulses: Normal pulses.  ?   Heart sounds: Normal heart sounds. No murmur  heard. ?Pulmonary:  ?   Effort: Pulmonary effort is normal. No respiratory distress.  ?   Breath sounds: Normal breath sounds. No wheezing.  ?Abdominal:  ?   General: Bowel sounds are normal. There is no dist

## 2021-11-29 ENCOUNTER — Non-Acute Institutional Stay (SKILLED_NURSING_FACILITY): Payer: Medicare PPO | Admitting: Internal Medicine

## 2021-11-29 ENCOUNTER — Encounter: Payer: Self-pay | Admitting: Internal Medicine

## 2021-11-29 DIAGNOSIS — E1121 Type 2 diabetes mellitus with diabetic nephropathy: Secondary | ICD-10-CM

## 2021-11-29 DIAGNOSIS — F03B0 Unspecified dementia, moderate, without behavioral disturbance, psychotic disturbance, mood disturbance, and anxiety: Secondary | ICD-10-CM

## 2021-11-29 DIAGNOSIS — R300 Dysuria: Secondary | ICD-10-CM

## 2021-11-29 NOTE — Progress Notes (Unsigned)
Location:   [facility] [place] Room Number: 26 Place of Service:  SNF [phone]) Provider:  Veleta Miners MD   Virgie Dad, MD  Patient Care Team: Virgie Dad, MD as PCP - General (Internal Medicine) [name], MD (Ophthalmology) [name], MD as Consulting Physician (General Surgery) [name], MD as Consulting Physician (Internal Medicine)  Extended Emergency Contact Information Primary Emergency Contact: [name],[name] Address: [place] [address] [place] Phone: [phone] Mobile Phone: [phone] Relation: Spouse  Code Status:  DNR Palliative Care Managed Care Goals of care: Advanced Directive information    11/29/2021   10:03 AM  Advanced Directives  Does Patient Have a Medical Advance Directive? Yes  Type of Paramedic of [place];Living will;Out of facility DNR (pink MOST or yellow form)  Does patient want to make changes to medical advance directive? No - Patient declined  Copy of [place] in Chart? Yes - validated most recent copy scanned in chart (See row information)  Pre-existing out of facility DNR order (yellow form or pink MOST form) Yellow form placed in chart (order not valid for inpatient use);Pink MOST form placed in chart (order not valid for inpatient use)     Chief Complaint  Patient presents with   Acute Visit    Dysuria    HPI:  Pt is a 81 y.o. female seen today for an acute visit for Painful Micturition  She lives in SNF Has been c/o Pain after she is done with her Micturition. Also c/o Lower abdominal discomfort No fever or hematuria No Nausea or Vomiting Urine culture was negative But Urine had more then 50 WBC No Blood  Patient has h/o  Type 2 Diabetes Mellitus  On Multiple Meds.  Neurodegenerative Dementia Seen Neurology in 03/21 MMSE 25/30 MRI Moderate atrophy. Mild progression of white matter changes  most likely due to chronic microvascular ischemia Urinary Incontinence HTN Insmonia, And Neuropathy Past Medical History:  Diagnosis Date   Allergy    [name]; allergy shots weekly.     Anxiety    Arthritis    DDD lumbar spine.     Cancer ([place])    breast L x 2; skin basal cell carcinoma [name]).   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   Past Surgical History:  Procedure Laterality Date   BREAST LUMPECTOMY  1996   L breast cancer   BREAST SURGERY N/A    Phreesia 12/12/2019   EYE SURGERY N/A    Phreesia 12/12/2019   MASTECTOMY  2002   Bilateral for breast cancer   VAGINAL HYSTERECTOMY  1985   DUB; uterine fibroids; ovaries intact.    Allergies  Allergen Reactions   Fruit & Vegetable Daily [Nutritional Supplements] Diarrhea   Onion Other (See Comments)    indigestion   Tree Extract Swelling   Sulfa Drugs Cross Reactors Rash    All over the body    Allergies as of 11/29/2021       Reactions   Fruit & Vegetable Daily [nutritional Supplements] Diarrhea   Onion Other (See Comments)   indigestion   Tree Extract Swelling   Sulfa Drugs Cross Reactors Rash  All over the body        Medication List        Accurate as of Nov 29, 2021 10:04 AM. If you have any questions, ask your nurse or doctor.          aspirin 81 MG tablet Take 81 mg by mouth daily.   fexofenadine 180 MG tablet Commonly known as: ALLEGRA Take 180 mg by mouth daily.   fluticasone 50 MCG/ACT nasal spray Commonly known as: FLONASE Place 1 spray into both nostrils daily.   gabapentin 100 MG capsule Commonly known as: NEURONTIN Take 200 mg by mouth 2 (two) times daily.   glipiZIDE 5 MG 24 hr tablet Commonly known as: GLUCOTROL XL TAKE 1 TABLET EVERY DAY WITH BREAKFAST.    ibuprofen 200 MG tablet Commonly known as: ADVIL Take 200 mg by mouth every 4 (four) hours as needed.   Invokana 100 MG Tabs tablet Generic drug: canagliflozin TAKE 1 TABLET ONCE DAILY.   ketotifen 0.025 % ophthalmic solution Commonly known as: ZADITOR 1 drop 2 (two) times daily as needed.   montelukast 10 MG tablet Commonly known as: SINGULAIR Take 10 mg by mouth every morning.   sertraline 50 MG tablet Commonly known as: ZOLOFT Take 50 mg by mouth daily.   tolterodine 4 MG 24 hr capsule Commonly known as: DETROL LA Take 4 mg by mouth daily.   Toprol XL 100 MG 24 hr tablet Generic drug: metoprolol succinate TAKE 1 TABLET TWICE DAILY WITH FOOD.   zinc oxide 20 % ointment Apply 1 application topically as needed for irritation.        Review of Systems  Constitutional:  Negative for activity change and appetite change.  HENT: Negative.    Respiratory:  Negative for cough and shortness of breath.   Cardiovascular:  Negative for leg swelling.  Gastrointestinal:  Negative for constipation.  Genitourinary:  Positive for dysuria.  Musculoskeletal:  Positive for gait problem. Negative for arthralgias and myalgias.  Skin: Negative.   Neurological:  Positive for weakness. Negative for dizziness.  Psychiatric/Behavioral:  Positive for confusion. Negative for dysphoric mood and sleep disturbance.    Immunization History  Administered Date(s) Administered   Influenza Split 03/19/2012, 03/19/2013   Influenza, High Dose Seasonal PF 03/29/2018   Influenza,inj,Quad PF,6+ Mos 04/09/2014, 03/22/2015, 03/24/2016   Influenza-Unspecified 04/09/2017, 03/24/2018, 04/16/2020   Moderna Sars-Covid-2 Vaccination 05/19/2021   PFIZER(Purple Top)SARS-COV-2 Vaccination 08/17/2019, 09/10/2019, 04/06/2020, 11/19/2020   Pfizer Covid-19 Vaccine Bivalent Booster 62yr & up 11/18/2020   Pneumococcal Conjugate-13 11/05/2014   Pneumococcal Polysaccharide-23 07/10/2004, 12/12/2010, 05/02/2016    Pneumococcal-Unspecified 07/10/2004, 12/12/2010, 05/02/2016   Td 07/10/2009   Zoster Recombinat (Shingrix) 04/09/2017, 07/19/2017   Zoster, Live 07/10/2006   Pertinent  Health Maintenance Due  Topic Date Due   OPHTHALMOLOGY EXAM  11/29/2017   URINE MICROALBUMIN  05/17/2019   HEMOGLOBIN A1C  01/19/2022   INFLUENZA VACCINE  02/07/2022   FOOT EXAM  08/24/2022   DEXA SCAN  Completed      12/23/2018   12:00 PM 09/30/2019    1:24 PM 12/15/2019    4:00 PM 12/25/2019    2:02 PM 09/18/2020   12:44 PM  Fall Risk  Falls in the past year? 0 1 0 0   Was there an injury with Fall? 0 0  0   Fall Risk Category Calculator 0 1  0   Fall Risk Category Low Low  Low   Patient Fall Risk Level    Low fall risk  Low fall risk  Fall risk Follow up   Falls evaluation completed Falls evaluation completed;Education provided    Functional Status Survey:    Vitals:   11/29/21 1000  BP: (!) 152/87  Pulse: 79  Resp: 20  Temp: (!) 97.5 F (36.4 C)  SpO2: 92%  Weight: 160 lb 1.6 oz (72.6 kg)  Height: '5\' 4"'$  (1.626 m)   Body mass index is 27.48 kg/m. Physical Exam Vitals reviewed.  Constitutional:      Appearance: Normal appearance.  HENT:     Head: Normocephalic.     Nose: Nose normal.     Mouth/Throat:     Mouth: Mucous membranes are moist.     Pharynx: Oropharynx is clear.  Eyes:     Pupils: Pupils are equal, round, and reactive to light.  Cardiovascular:     Rate and Rhythm: Normal rate and regular rhythm.     Pulses: Normal pulses.     Heart sounds: Normal heart sounds. No murmur heard. Pulmonary:     Effort: Pulmonary effort is normal.     Breath sounds: Normal breath sounds.  Abdominal:     General: Abdomen is flat. Bowel sounds are normal.     Palpations: Abdomen is soft.  Genitourinary:    Comments: Mild Redness in Inner vaginal area  Musculoskeletal:        General: No swelling.     Cervical back: Neck supple.  Skin:    General: Skin is warm.  Neurological:     Mental  Status: She is alert.     Comments: Has aphasia  Psychiatric:        Mood and Affect: Mood normal.        Thought Content: Thought content normal.    Labs reviewed: Recent Labs    09/26/21 0000  NA 140  K 4.7  CL 111*  CO2 20  BUN 22*  CREATININE 1.1  CALCIUM 8.8   Recent Labs    09/26/21 0000  AST 16  ALT 10  ALKPHOS 76  ALBUMIN 3.6   Recent Labs    09/26/21 0000  WBC 9.5  NEUTROABS 5,909.00  HGB 13.6  HCT 41  PLT 306   Lab Results  Component Value Date   TSH 3.100 08/12/2019   Lab Results  Component Value Date   HGBA1C 6.0 07/22/2021   Lab Results  Component Value Date   CHOL 158 12/19/2018   HDL 36 (L) 12/19/2018   LDLCALC 68 12/19/2018   TRIG 272 (H) 12/19/2018   CHOLHDL 4.4 12/19/2018    Significant Diagnostic Results in last 30 days:  No results found.  Assessment/Plan Dysuria UA positive for 50 plus WBC  Growth negative Possible Cystitis Discontinue Toleridone Start on Elavil Reval in few weeks Also Miconazole BID for 10 days Urology Referal if not better  2. Moderate dementia without behavioral disturbance,  Continues to do well in SNF Now also working with therapy   Family/ staff Communication:   Labs/tests ordered:

## 2021-12-12 ENCOUNTER — Encounter: Payer: Self-pay | Admitting: Orthopedic Surgery

## 2021-12-12 ENCOUNTER — Non-Acute Institutional Stay (SKILLED_NURSING_FACILITY): Payer: Medicare PPO | Admitting: Orthopedic Surgery

## 2021-12-12 DIAGNOSIS — R300 Dysuria: Secondary | ICD-10-CM

## 2021-12-12 DIAGNOSIS — F03B0 Unspecified dementia, moderate, without behavioral disturbance, psychotic disturbance, mood disturbance, and anxiety: Secondary | ICD-10-CM

## 2021-12-12 DIAGNOSIS — J302 Other seasonal allergic rhinitis: Secondary | ICD-10-CM

## 2021-12-12 DIAGNOSIS — G629 Polyneuropathy, unspecified: Secondary | ICD-10-CM

## 2021-12-12 DIAGNOSIS — E1121 Type 2 diabetes mellitus with diabetic nephropathy: Secondary | ICD-10-CM | POA: Diagnosis not present

## 2021-12-12 DIAGNOSIS — K580 Irritable bowel syndrome with diarrhea: Secondary | ICD-10-CM

## 2021-12-12 DIAGNOSIS — F339 Major depressive disorder, recurrent, unspecified: Secondary | ICD-10-CM

## 2021-12-12 DIAGNOSIS — N3941 Urge incontinence: Secondary | ICD-10-CM

## 2021-12-12 DIAGNOSIS — I10 Essential (primary) hypertension: Secondary | ICD-10-CM

## 2021-12-12 NOTE — Progress Notes (Signed)
Location:  [facility] Room Number: N26/A Place of Service:  SNF [phone]) Provider: Yvonna Alanis, NP  Patient Care Team: Virgie Dad, MD as PCP - General (Internal Medicine) [name], MD (Ophthalmology) [name], MD as Consulting Physician (General Surgery) [name], MD as Consulting Physician (Internal Medicine)  Extended Emergency Contact Information Primary Emergency Contact: [name],[name] Address: [place] [address] [place] Phone: [phone] Mobile Phone: [phone] Relation: Spouse  Code Status:  DNR Goals of care: Advanced Directive information    12/12/2021    4:16 PM  Advanced Directives  Does Patient Have a Medical Advance Directive? Yes  Type of Paramedic of [place];Living will;Out of facility DNR (pink MOST or yellow form)  Does patient want to make changes to medical advance directive? No - Patient declined  Copy of [place] in Chart? Yes - validated most recent copy scanned in chart (See row information)  Pre-existing out of facility DNR order (yellow form or pink MOST form) Yellow form placed in chart (order not valid for inpatient use);Pink MOST form placed in chart (order not valid for inpatient use)     Chief Complaint  Patient presents with   Acute Visit    Dysuria    HPI:  Pt is a 81 y.o. female seen today for an acute visit for dysuria.   Intermittent dysuria x 1 month. 05/04 UA positive for leukocytes 2+, blood 1+, bacteria and yeast. Urine culture suggested recollection. 05/23 she continued to have symptoms, UA revealed leukocytes 2+ and occult blood 1+, urine culture was negative. Urology consult made, scheduled 12/22/2021. She was also taken off Ditropan and started on Elavil. Today, her husband reports she is still having dysuria. She now describes her urine as cloudy with foul odor. Husband requesting another urine  culture be redone, since urology consult is not for another 10 days. Afebrile. Admits to drinking a lot of water throughout the day.   Dementia- followed by palliative, MMSE 25/30 2021, diagnosed with neurodegenerative disorder per neurology, poor safety awareness, frequent falls- improved since starting PT/OT, no behavioral outbursts T2DM- A1c 6.0 07/22/2021, sugars averaging < 150, no hypoglycemic events, remains on asa, glipizide and Invokana HTN- no recent lab work, remains on metoprolol Peripheral neuropathy- remains on gabapentin  Urge incontinence- ditropan discontinued due to dysuria  IBS- no recent episodes, rifaximin discontinued  Depression- no mood changes, remains on Zoloft Allergies- stable with Allegra, Flonase and Singulair daily  Past Medical History:  Diagnosis Date   Allergy    [name]; allergy shots weekly.     Anxiety    Arthritis    DDD lumbar spine.     Cancer ([place])    breast L x 2; skin basal cell carcinoma [name]).   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   Past Surgical History:  Procedure Laterality Date   BREAST LUMPECTOMY  1996   L breast cancer   BREAST SURGERY N/A    Phreesia 12/12/2019   EYE SURGERY N/A    Phreesia 12/12/2019   MASTECTOMY  2002   Bilateral for breast cancer   VAGINAL HYSTERECTOMY  1985   DUB; uterine fibroids;  ovaries intact.    Allergies  Allergen Reactions   Fruit & Vegetable Daily [Nutritional Supplements] Diarrhea   Onion Other (See Comments)    indigestion   Tree Extract Swelling   Sulfa Drugs Cross Reactors Rash    All over the body    Outpatient Encounter Medications as of 12/12/2021  Medication Sig   amitriptyline (ELAVIL) 10 MG tablet Take 10 mg by mouth at bedtime.   aspirin 81 MG tablet Take 81 mg by mouth  daily.   fexofenadine (ALLEGRA) 180 MG tablet Take 180 mg by mouth daily.   fluticasone (FLONASE) 50 MCG/ACT nasal spray Place 1 spray into both nostrils daily.   gabapentin (NEURONTIN) 100 MG capsule Take 200 mg by mouth 2 (two) times daily.   glipiZIDE (GLUCOTROL XL) 5 MG 24 hr tablet TAKE 1 TABLET EVERY DAY WITH BREAKFAST.   ibuprofen (ADVIL) 200 MG tablet Take 200 mg by mouth every 4 (four) hours as needed.   INVOKANA 100 MG TABS tablet TAKE 1 TABLET ONCE DAILY.   ketotifen (ZADITOR) 0.025 % ophthalmic solution 1 drop 2 (two) times daily as needed.   montelukast (SINGULAIR) 10 MG tablet Take 10 mg by mouth every morning.   sertraline (ZOLOFT) 50 MG tablet Take 50 mg by mouth daily.   TOPROL XL 100 MG 24 hr tablet TAKE 1 TABLET TWICE DAILY WITH FOOD.   zinc oxide 20 % ointment Apply 1 application topically as needed for irritation.   No facility-administered encounter medications on file as of 12/12/2021.    Review of Systems  Unable to perform ROS: Dementia   Immunization History  Administered Date(s) Administered   Influenza Split 03/19/2012, 03/19/2013   Influenza, High Dose Seasonal PF 03/29/2018   Influenza,inj,Quad PF,6+ Mos 04/09/2014, 03/22/2015, 03/24/2016   Influenza-Unspecified 04/09/2017, 03/24/2018, 04/16/2020   Moderna Sars-Covid-2 Vaccination 05/19/2021   PFIZER(Purple Top)SARS-COV-2 Vaccination 08/17/2019, 09/10/2019, 04/06/2020, 11/19/2020   Pfizer Covid-19 Vaccine Bivalent Booster 42yr & up 11/18/2020   Pneumococcal Conjugate-13 11/05/2014   Pneumococcal Polysaccharide-23 07/10/2004, 12/12/2010, 05/02/2016   Pneumococcal-Unspecified 07/10/2004, 12/12/2010, 05/02/2016   Td 07/10/2009   Zoster Recombinat (Shingrix) 04/09/2017, 07/19/2017   Zoster, Live 07/10/2006   Pertinent  Health Maintenance Due  Topic Date Due   OPHTHALMOLOGY EXAM  11/29/2017   URINE MICROALBUMIN  05/17/2019   HEMOGLOBIN A1C  01/19/2022   INFLUENZA VACCINE  02/07/2022   FOOT EXAM   08/24/2022   DEXA SCAN  Completed      12/23/2018   12:00 PM 09/30/2019    1:24 PM 12/15/2019    4:00 PM 12/25/2019    2:02 PM 09/18/2020   12:44 PM  Fall Risk  Falls in the past year? 0 1 0 0   Was there an injury with Fall? 0 0  0   Fall Risk Category Calculator 0 1  0   Fall Risk Category Low Low  Low   Patient Fall Risk Level    Low fall risk Low fall risk  Fall risk Follow up   Falls evaluation completed Falls evaluation completed;Education provided    Functional Status Survey:    Vitals:   12/12/21 1611  BP: 122/71  Pulse: 66  Resp: 20  Temp: (!) 97.4 F (36.3 C)  SpO2: 95%  Weight: 161 lb 11.2 oz (73.3 kg)  Height: '5\' 4"'$  (1.626 m)   Body mass index is 27.76 kg/m. Physical Exam Vitals reviewed.  Constitutional:      General: She is not in acute distress. HENT:  Head: Normocephalic.  Eyes:     General:        Right eye: No discharge.        Left eye: No discharge.  Cardiovascular:     Rate and Rhythm: Normal rate and regular rhythm.     Pulses: Normal pulses.     Heart sounds: Normal heart sounds.  Pulmonary:     Effort: Pulmonary effort is normal. No respiratory distress.     Breath sounds: Normal breath sounds. No wheezing.  Abdominal:     General: Bowel sounds are normal. There is no distension.     Palpations: Abdomen is soft.     Tenderness: There is no abdominal tenderness. There is no right CVA tenderness or left CVA tenderness.  Musculoskeletal:     Cervical back: Neck supple.     Right lower leg: No edema.     Left lower leg: No edema.  Skin:    General: Skin is warm and dry.     Capillary Refill: Capillary refill takes less than 2 seconds.  Neurological:     General: No focal deficit present.     Mental Status: She is alert. Mental status is at baseline.     Motor: Weakness present.     Gait: Gait abnormal.     Comments: wheelchair  Psychiatric:        Mood and Affect: Mood normal.        Behavior: Behavior normal.        Cognition  and Memory: Cognition is impaired. Memory is impaired.     Comments: Very pleasant, follows commands, alert to self/person    Labs reviewed: Recent Labs    09/26/21 0000  NA 140  K 4.7  CL 111*  CO2 20  BUN 22*  CREATININE 1.1  CALCIUM 8.8   Recent Labs    09/26/21 0000  AST 16  ALT 10  ALKPHOS 76  ALBUMIN 3.6   Recent Labs    09/26/21 0000  WBC 9.5  NEUTROABS 5,909.00  HGB 13.6  HCT 41  PLT 306   Lab Results  Component Value Date   TSH 3.100 08/12/2019   Lab Results  Component Value Date   HGBA1C 6.0 07/22/2021   Lab Results  Component Value Date   CHOL 158 12/19/2018   HDL 36 (L) 12/19/2018   LDLCALC 68 12/19/2018   TRIG 272 (H) 12/19/2018   CHOLHDL 4.4 12/19/2018    Significant Diagnostic Results in last 30 days:  No results found.  Assessment/Plan 1. Dysuria - ongoing, now reports urine cloudy with odor - past UA/culture +2 leukocytes and +1 blood/ culture negative - ? cystitis - urology consult scheduled 12/22/2021 - UA and culture (I/O cath for collection) - encourage hydration with water - cont Elavil   2. Moderate dementia without behavioral disturbance, psychotic disturbance, mood disturbance, or anxiety, unspecified dementia type ([place]) - discharged from hospice care - followed by palliative - doing well in skilled nursing  3. Controlled type 2 diabetes mellitus with diabetic nephropathy, without long-term current use of insulin ([facility]) - A1c stable - cont asa, glipizide and Invokana - A1c- future - urine microalbumin- future  4. Primary hypertension - controlled with metoprolol  5. Peripheral polyneuropathy - cont gabapentin  6. Urge incontinence - Ditropan discontinued  7. 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 Seasonal allergies - cont allegra, Flonase and Singulair  Family/ staff Communication: plan discussed with patient,  husband and nurse   Labs/tests ordered:  UA/culture, A1c, urine microalbumin

## 2021-12-13 LAB — MICROALBUMIN, URINE: Microalb, Ur: 151

## 2021-12-16 ENCOUNTER — Encounter: Payer: Self-pay | Admitting: Orthopedic Surgery

## 2021-12-16 ENCOUNTER — Non-Acute Institutional Stay (SKILLED_NURSING_FACILITY): Payer: Medicare PPO | Admitting: Orthopedic Surgery

## 2021-12-16 DIAGNOSIS — R300 Dysuria: Secondary | ICD-10-CM | POA: Diagnosis not present

## 2021-12-16 DIAGNOSIS — R809 Proteinuria, unspecified: Secondary | ICD-10-CM

## 2021-12-16 DIAGNOSIS — K580 Irritable bowel syndrome with diarrhea: Secondary | ICD-10-CM

## 2021-12-16 DIAGNOSIS — F03B0 Unspecified dementia, moderate, without behavioral disturbance, psychotic disturbance, mood disturbance, and anxiety: Secondary | ICD-10-CM

## 2021-12-16 DIAGNOSIS — E1129 Type 2 diabetes mellitus with other diabetic kidney complication: Secondary | ICD-10-CM | POA: Diagnosis not present

## 2021-12-16 DIAGNOSIS — E1121 Type 2 diabetes mellitus with diabetic nephropathy: Secondary | ICD-10-CM

## 2021-12-16 DIAGNOSIS — G629 Polyneuropathy, unspecified: Secondary | ICD-10-CM

## 2021-12-16 DIAGNOSIS — F339 Major depressive disorder, recurrent, unspecified: Secondary | ICD-10-CM

## 2021-12-16 DIAGNOSIS — N3941 Urge incontinence: Secondary | ICD-10-CM

## 2021-12-16 DIAGNOSIS — J302 Other seasonal allergic rhinitis: Secondary | ICD-10-CM

## 2021-12-16 DIAGNOSIS — I10 Essential (primary) hypertension: Secondary | ICD-10-CM

## 2021-12-16 LAB — HEMOGLOBIN A1C: Hemoglobin A1C: 8.3

## 2021-12-16 NOTE — Progress Notes (Unsigned)
Location:  [facility] Room Number: N26/A Place of Service:  SNF [phone]) Provider: Yvonna Alanis, NP   Patient Care Team: Virgie Dad, MD as PCP - General (Internal Medicine) [name], MD (Ophthalmology) [name], MD as Consulting Physician (General Surgery) [name], MD as Consulting Physician (Internal Medicine)  Extended Emergency Contact Information Primary Emergency Contact: [name],[name] Address: [place] [address] [place] Phone: [phone] Mobile Phone: [phone] Relation: Spouse  Code Status:  DNR Goals of care: Advanced Directive information    12/16/2021    1:54 PM  Advanced Directives  Does Patient Have a Medical Advance Directive? Yes  Type of Paramedic of [place];Living will;Out of facility DNR (pink MOST or yellow form)  Does patient want to make changes to medical advance directive? No - Patient declined  Copy of [place] in Chart? Yes - validated most recent copy scanned in chart (See row information)  Pre-existing out of facility DNR order (yellow form or pink MOST form) Yellow form placed in chart (order not valid for inpatient use);Pink MOST form placed in chart (order not valid for inpatient use)     Chief Complaint  Patient presents with   Acute Visit    Elevated A1C     HPI:  Pt is a 81 y.o. female seen today for an acute visit for elevated A1c.   T2DM- A1c  8.3 (06/09)> was 6.0 (01/13), urine microalbumin 151 12/13/2021, she was taken off metformin when enrolled with hospice- recently discharged, no hypoglycemic events, admits to drinking sodas daily, remains on asa, glipizide and Invokana Dysuria- intermittent dysuria x 1 month, 05/04 urine culture suggested recollection, urine culture 06/06 no growth, UA with moderate yeast, she continues to report intermittent burning and itching, urology consult scheduled  12/22/2021 Dementia- followed by palliative, MMSE 25/30 2021, diagnosed with neurodegenerative disorder per neurology, poor safety awareness, frequent falls- improved since starting PT/OT, no behavioral outbursts HTN- no recent lab work, remains on metoprolol Peripheral neuropathy- remains on gabapentin  Urge incontinence- ditropan discontinued due to dysuria  IBS- no recent episodes, rifaximin discontinued  Depression- no mood changes, remains on Zoloft Allergies- stable with Allegra, Flonase and Singulair daily   Past Medical History:  Diagnosis Date   Allergy    [name]; allergy shots weekly.     Anxiety    Arthritis    DDD lumbar spine.     Cancer ([place])    breast L x 2; skin basal cell carcinoma [name]).   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   Past Surgical History:  Procedure Laterality Date   BREAST LUMPECTOMY  1996   L breast cancer   BREAST SURGERY N/A    Phreesia 12/12/2019   EYE SURGERY N/A    Phreesia 12/12/2019   MASTECTOMY  2002   Bilateral for breast cancer   VAGINAL HYSTERECTOMY  1985   DUB; uterine fibroids; ovaries intact.    Allergies  Allergen Reactions   Fruit & Vegetable Daily [Nutritional Supplements] Diarrhea   Onion Other (See Comments)    indigestion   Tree Extract Swelling   Sulfa Drugs Cross Reactors Rash    All  over the body    Outpatient Encounter Medications as of 12/16/2021  Medication Sig   amitriptyline (ELAVIL) 10 MG tablet Take 10 mg by mouth at bedtime.   aspirin 81 MG tablet Take 81 mg by mouth daily.   fexofenadine (ALLEGRA) 180 MG tablet Take 180 mg by mouth daily.   fluticasone (FLONASE) 50 MCG/ACT nasal spray Place 1 spray into both nostrils daily.   gabapentin (NEURONTIN) 100 MG capsule Take 200 mg by mouth 2 (two)  times daily.   glipiZIDE (GLUCOTROL XL) 5 MG 24 hr tablet TAKE 1 TABLET EVERY DAY WITH BREAKFAST.   ibuprofen (ADVIL) 200 MG tablet Take 200 mg by mouth every 4 (four) hours as needed.   INVOKANA 100 MG TABS tablet TAKE 1 TABLET ONCE DAILY.   ketotifen (ZADITOR) 0.025 % ophthalmic solution 1 drop 2 (two) times daily as needed.   montelukast (SINGULAIR) 10 MG tablet Take 10 mg by mouth every morning.   sertraline (ZOLOFT) 50 MG tablet Take 50 mg by mouth daily.   TOPROL XL 100 MG 24 hr tablet TAKE 1 TABLET TWICE DAILY WITH FOOD.   zinc oxide 20 % ointment Apply 1 application topically as needed for irritation.   No facility-administered encounter medications on file as of 12/16/2021.    Review of Systems  Constitutional:  Negative for activity change, appetite change, chills, fatigue and fever.  HENT:  Negative for congestion and sore throat.   Eyes:  Negative for visual disturbance.  Respiratory:  Negative for cough, shortness of breath and wheezing.   Cardiovascular:  Negative for chest pain and leg swelling.  Gastrointestinal:  Negative for abdominal distention, abdominal pain, constipation, diarrhea, nausea and vomiting.  Endocrine: Negative for polydipsia, polyphagia and polyuria.  Genitourinary:  Positive for dysuria. Negative for frequency, hematuria and vaginal discharge.  Musculoskeletal:  Positive for gait problem.  Skin:  Negative for rash.  Neurological:  Positive for weakness and numbness. Negative for dizziness and headaches.  Psychiatric/Behavioral:  Positive for confusion and dysphoric mood. Negative for sleep disturbance. The patient is not nervous/anxious.     Immunization History  Administered Date(s) Administered   Influenza Split 03/19/2012, 03/19/2013   Influenza, High Dose Seasonal PF 03/29/2018   Influenza,inj,Quad PF,6+ Mos 04/09/2014, 03/22/2015, 03/24/2016   Influenza-Unspecified 04/09/2017, 03/24/2018, 04/16/2020   Moderna Sars-Covid-2 Vaccination 05/19/2021    PFIZER(Purple Top)SARS-COV-2 Vaccination 08/17/2019, 09/10/2019, 04/06/2020, 11/19/2020   Pfizer Covid-19 Vaccine Bivalent Booster 89yr & up 11/18/2020   Pneumococcal Conjugate-13 11/05/2014   Pneumococcal Polysaccharide-23 07/10/2004, 12/12/2010, 05/02/2016   Pneumococcal-Unspecified 07/10/2004, 12/12/2010, 05/02/2016   Td 07/10/2009   Zoster Recombinat (Shingrix) 04/09/2017, 07/19/2017   Zoster, Live 07/10/2006   Pertinent  Health Maintenance Due  Topic Date Due   OPHTHALMOLOGY EXAM  11/29/2017   URINE MICROALBUMIN  05/17/2019   INFLUENZA VACCINE  02/07/2022   HEMOGLOBIN A1C  06/17/2022   FOOT EXAM  08/24/2022   DEXA SCAN  Completed      12/23/2018   12:00 PM 09/30/2019    1:24 PM 12/15/2019    4:00 PM 12/25/2019    2:02 PM 09/18/2020   12:44 PM  Fall Risk  Falls in the past year? 0 1 0 0   Was there an injury with Fall? 0 0  0   Fall Risk Category Calculator 0 1  0   Fall Risk Category Low Low  Low   Patient Fall Risk Level    Low fall risk Low fall risk  Fall risk Follow up  Falls evaluation completed Falls evaluation completed;Education provided    Functional Status Survey:    Vitals:   12/16/21 1347  BP: 121/66  Pulse: 68  Resp: 18  Temp: (!) 97.4 F (36.3 C)  SpO2: 95%  Weight: 161 lb 11.2 oz (73.3 kg)  Height: '5\' 4"'$  (1.626 m)   Body mass index is 27.76 kg/m. Physical Exam Vitals reviewed.  Constitutional:      General: She is not in acute distress. HENT:     Head: Normocephalic.  Eyes:     General:        Right eye: No discharge.        Left eye: No discharge.  Cardiovascular:     Rate and Rhythm: Normal rate and regular rhythm.     Pulses: Normal pulses.     Heart sounds: Normal heart sounds.  Pulmonary:     Effort: Pulmonary effort is normal. No respiratory distress.     Breath sounds: Normal breath sounds. No wheezing.  Abdominal:     General: Bowel sounds are normal. There is no distension.     Palpations: Abdomen is soft.      Tenderness: There is no abdominal tenderness.  Musculoskeletal:     Cervical back: Neck supple.     Right lower leg: No edema.     Left lower leg: No edema.  Skin:    General: Skin is warm and dry.     Capillary Refill: Capillary refill takes less than 2 seconds.  Neurological:     General: No focal deficit present.     Mental Status: She is alert. Mental status is at baseline.     Motor: Weakness present.     Gait: Gait abnormal.  Psychiatric:        Mood and Affect: Mood normal.        Behavior: Behavior normal.        Cognition and Memory: Memory is impaired.     Labs reviewed: Recent Labs    09/26/21 0000  NA 140  K 4.7  CL 111*  CO2 20  BUN 22*  CREATININE 1.1  CALCIUM 8.8   Recent Labs    09/26/21 0000  AST 16  ALT 10  ALKPHOS 76  ALBUMIN 3.6   Recent Labs    09/26/21 0000  WBC 9.5  NEUTROABS 5,909.00  HGB 13.6  HCT 41  PLT 306   Lab Results  Component Value Date   TSH 3.100 08/12/2019   Lab Results  Component Value Date   HGBA1C 8.3 12/16/2021   Lab Results  Component Value Date   CHOL 158 12/19/2018   HDL 36 (L) 12/19/2018   LDLCALC 68 12/19/2018   TRIG 272 (H) 12/19/2018   CHOLHDL 4.4 12/19/2018    Significant Diagnostic Results in last 30 days:  No results found.  Assessment/Plan 1. Controlled type 2 diabetes mellitus with diabetic nephropathy, without long-term current use of insulin (HCC) - A1c  8.3 (06/09)> was 6.0 (01/13) - taken off metformin while she was enrolled under hospice- off now - admits to drinking soda daily - was on metformin 500 mg po TID by Dr. Cruzita Lederer - will start metformin 500 mg po daily - advised to stop soda drinking - A1c in 2 months - cont asa, glipizide and Invokana  2. Microalbuminuria due to type 2 diabetes mellitus (HCC) - urine microalbumin 151 12/13/2021 - will start lisinopril 2.5 mg po daily - recheck urine microalbumin in 3 months  3. Dysuria -  06/06 urine culture no growth, UA with  moderate yeast - suspect yeast infection due to increased A1c - start diflucan 150 mg po q72 hours x 2 doses - if improved symptoms will consider cancelling urology consult  4. Moderate dementia without behavioral disturbance, psychotic disturbance, mood disturbance, or anxiety, unspecified dementia type ([place]) - followed by palliative - doing well in SNF  5. Primary hypertension - controlled with metoprolol  6. Peripheral polyneuropathy - cont gabapentin  7. Urge incontinence - ditropan discontinued due to urinary symptoms  8. 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 Seasonal allergies - stable with Allegra, Flonase, and Singulair   Family/ staff Communication: plan discussed with patient, husband and nurse  Labs/tests ordered:  none

## 2021-12-21 ENCOUNTER — Other Ambulatory Visit: Payer: Self-pay | Admitting: Orthopedic Surgery

## 2021-12-21 DIAGNOSIS — E1121 Type 2 diabetes mellitus with diabetic nephropathy: Secondary | ICD-10-CM

## 2021-12-21 MED ORDER — METFORMIN HCL 500 MG PO TABS: 500.0000 mg | ORAL_TABLET | Freq: Two times a day (BID) | ORAL | 3 refills | Status: DC

## 2021-12-21 NOTE — Progress Notes (Signed)
Medication review with pharmacist. Plan to discontinue Invokana due to genitourinary symptoms. Will increase metformin to 500 mg po BID.

## 2021-12-26 LAB — BASIC METABOLIC PANEL
BUN: 25 — AB (ref 4–21)
CO2: 21 (ref 13–22)
Chloride: 108 (ref 99–108)
Creatinine: 1.2 — AB (ref 0.5–1.1)
Glucose: 146
Potassium: 3.9 mEq/L (ref 3.5–5.1)
Sodium: 138 (ref 137–147)

## 2021-12-26 LAB — COMPREHENSIVE METABOLIC PANEL
Calcium: 8.8 (ref 8.7–10.7)
eGFR: 47

## 2022-01-04 ENCOUNTER — Encounter: Payer: Self-pay | Admitting: Orthopedic Surgery

## 2022-01-04 ENCOUNTER — Non-Acute Institutional Stay (SKILLED_NURSING_FACILITY): Payer: Medicare PPO | Admitting: Orthopedic Surgery

## 2022-01-04 DIAGNOSIS — E1129 Type 2 diabetes mellitus with other diabetic kidney complication: Secondary | ICD-10-CM

## 2022-01-04 DIAGNOSIS — R300 Dysuria: Secondary | ICD-10-CM

## 2022-01-04 DIAGNOSIS — E1121 Type 2 diabetes mellitus with diabetic nephropathy: Secondary | ICD-10-CM

## 2022-01-04 DIAGNOSIS — Z7189 Other specified counseling: Secondary | ICD-10-CM | POA: Diagnosis not present

## 2022-01-04 DIAGNOSIS — R809 Proteinuria, unspecified: Secondary | ICD-10-CM

## 2022-01-04 NOTE — Progress Notes (Signed)
Location:  [facility] Room Number: N26/A Place of Service:  SNF [phone]) Provider: Yvonna Alanis, NP   Patient Care Team: Virgie Dad, MD as PCP - General (Internal Medicine) [name], MD (Ophthalmology) [name], MD as Consulting Physician (General Surgery) [name], MD as Consulting Physician (Internal Medicine)  Extended Emergency Contact Information Primary Emergency Contact: [name],[name] Address: [place] [address] [place] Phone: [phone] Mobile Phone: [phone] Relation: Spouse  Code Status:  DNR Goals of care: Advanced Directive information    01/04/2022   10:23 AM  Advanced Directives  Does Patient Have a Medical Advance Directive? Yes  Type of Paramedic of [place];Living will;Out of facility DNR (pink MOST or yellow form)  Does patient want to make changes to medical advance directive? No - Patient declined  Copy of [place] in Chart? Yes - validated most recent copy scanned in chart (See row information)  Pre-existing out of facility DNR order (yellow form or pink MOST form) Yellow form placed in chart (order not valid for inpatient use);Pink MOST form placed in chart (order not valid for inpatient use)     Chief Complaint  Patient presents with   Acute Visit    Medication reconciliation     HPI:  Pt is a 81 y.o. female seen today for an acute visit for medication reconciliation.   Discharged from [facility] 10/2021. Many of her medications were discontinued while enrolled in Hospice. In the past 6 weeks she has had ongoing yeast infections and dysuria. A1c  8.3 (06/09)> was 6.0 (01/13). She was started back on metformin 500 mg po bid. Invokana discontinued. Blood sugars averaging 150-170's. Yeast infections have subsided. Plan to recheck A1c 03/2022.   Urine microalbumin 151 12/13/2021, started on lisinopril 2.5 mg for kidney  protection.   06/15 she was seen by urology. Started on Fluconazole 200 mg po x 7 days.   Medications reviewed with husband. He agrees to current plan of care.    Past Medical History:  Diagnosis Date   Allergy    [name]; allergy shots weekly.     Anxiety    Arthritis    DDD lumbar spine.     Cancer ([place])    breast L x 2; skin basal cell carcinoma [name]).   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   Past Surgical History:  Procedure Laterality Date   BREAST LUMPECTOMY  1996   L breast cancer   BREAST SURGERY N/A    Phreesia 12/12/2019   EYE SURGERY N/A    Phreesia 12/12/2019   MASTECTOMY  2002   Bilateral for breast cancer   VAGINAL HYSTERECTOMY  1985   DUB; uterine fibroids; ovaries intact.    Allergies  Allergen Reactions   Fruit & Vegetable Daily [Nutritional Supplements] Diarrhea   Onion Other (See Comments)    indigestion   Tree Extract Swelling   Sulfa Drugs Cross Reactors Rash    All over the body    Outpatient Encounter Medications as of 01/04/2022  Medication Sig   amitriptyline (ELAVIL) 10 MG tablet Take 10 mg by mouth at bedtime.   aspirin 81 MG tablet  Take 81 mg by mouth daily.   fexofenadine (ALLEGRA) 180 MG tablet Take 180 mg by mouth daily.   fluticasone (FLONASE) 50 MCG/ACT nasal spray Place 1 spray into both nostrils daily.   gabapentin (NEURONTIN) 100 MG capsule Take 200 mg by mouth 2 (two) times daily.   glipiZIDE (GLUCOTROL XL) 5 MG 24 hr tablet TAKE 1 TABLET EVERY DAY WITH BREAKFAST.   ibuprofen (ADVIL) 200 MG tablet Take 200 mg by mouth every 4 (four) hours as needed.   ketotifen (ZADITOR) 0.025 % ophthalmic solution Place 1 drop into both eyes 2 (two) times daily as needed (Applu 1-2 gtts to both eyes).   lisinopril (ZESTRIL) 2.5 MG  tablet Take 2.5 mg by mouth in the morning.   metFORMIN (GLUCOPHAGE) 500 MG tablet Take 1 tablet (500 mg total) by mouth 2 (two) times daily with a meal.   montelukast (SINGULAIR) 10 MG tablet Take 10 mg by mouth every morning.   TOPROL XL 100 MG 24 hr tablet TAKE 1 TABLET TWICE DAILY WITH FOOD.   zinc oxide 20 % ointment Apply 1 application  topically as needed (To buttocks after every incontinent epsiode and as needed for redness).   sertraline (ZOLOFT) 50 MG tablet Take 50 mg by mouth daily.   No facility-administered encounter medications on file as of 01/04/2022.    Review of Systems  Unable to perform ROS: Dementia    Immunization History  Administered Date(s) Administered   Influenza Split 03/19/2012, 03/19/2013   Influenza, High Dose Seasonal PF 03/29/2018   Influenza,inj,Quad PF,6+ Mos 04/09/2014, 03/22/2015, 03/24/2016   Influenza-Unspecified 04/09/2017, 03/24/2018, 04/16/2020   Moderna Sars-Covid-2 Vaccination 05/19/2021   PFIZER(Purple Top)SARS-COV-2 Vaccination 08/17/2019, 09/10/2019, 04/06/2020, 11/19/2020   Pfizer Covid-19 Vaccine Bivalent Booster 66yr & up 11/18/2020   Pneumococcal Conjugate-13 11/05/2014   Pneumococcal Polysaccharide-23 07/10/2004, 12/12/2010, 05/02/2016   Pneumococcal-Unspecified 07/10/2004, 12/12/2010, 05/02/2016   Td 07/10/2009   Zoster Recombinat (Shingrix) 04/09/2017, 07/19/2017   Zoster, Live 07/10/2006   Pertinent  Health Maintenance Due  Topic Date Due   OPHTHALMOLOGY EXAM  11/29/2017   URINE MICROALBUMIN  05/17/2019   INFLUENZA VACCINE  02/07/2022   HEMOGLOBIN A1C  06/17/2022   FOOT EXAM  08/24/2022   DEXA SCAN  Completed      12/23/2018   12:00 PM 09/30/2019    1:24 PM 12/15/2019    4:00 PM 12/25/2019    2:02 PM 09/18/2020   12:44 PM  Fall Risk  Falls in the past year? 0 1 0 0   Was there an injury with Fall? 0 0  0   Fall Risk Category Calculator 0 1  0   Fall Risk Category Low Low  Low   Patient Fall Risk Level    Low fall  risk Low fall risk  Fall risk Follow up   Falls evaluation completed Falls evaluation completed;Education provided    Functional Status Survey:    Vitals:   01/04/22 1017  BP: (!) 111/58  Pulse: 64  Resp: 19  Temp: (!) 96 F (35.6 C)  SpO2: 97%  Weight: 161 lb 11.2 oz (73.3 kg)  Height: '5\' 4"'$  (1.626 m)   Body mass index is 27.76 kg/m. Physical Exam Vitals reviewed.  Constitutional:      General: She is not in acute distress. HENT:     Head: Normocephalic.  Eyes:     General:        Right eye: No discharge.        Left eye: No  discharge.  Cardiovascular:     Rate and Rhythm: Normal rate and regular rhythm.     Pulses: Normal pulses.     Heart sounds: Normal heart sounds.  Pulmonary:     Effort: Pulmonary effort is normal. No respiratory distress.     Breath sounds: Normal breath sounds. No wheezing.  Abdominal:     General: Bowel sounds are normal. There is no distension.     Palpations: Abdomen is soft.     Tenderness: There is no abdominal tenderness.  Musculoskeletal:     Cervical back: Neck supple.     Right lower leg: No edema.     Left lower leg: No edema.  Skin:    General: Skin is warm and dry.     Capillary Refill: Capillary refill takes less than 2 seconds.  Neurological:     General: No focal deficit present.     Mental Status: She is alert. Mental status is at baseline.     Motor: Weakness present.     Gait: Gait abnormal.     Comments: wheelchair  Psychiatric:        Mood and Affect: Mood normal.        Behavior: Behavior normal.     Labs reviewed: Recent Labs    09/26/21 0000 12/26/21 0000  NA 140 138  K 4.7 3.9  CL 111* 108  CO2 20 21  BUN 22* 25*  CREATININE 1.1 1.2*  CALCIUM 8.8 8.8   Recent Labs    09/26/21 0000  AST 16  ALT 10  ALKPHOS 76  ALBUMIN 3.6   Recent Labs    09/26/21 0000  WBC 9.5  NEUTROABS 5,909.00  HGB 13.6  HCT 41  PLT 306   Lab Results  Component Value Date   TSH 3.100 08/12/2019   Lab  Results  Component Value Date   HGBA1C 8.3 12/16/2021   Lab Results  Component Value Date   CHOL 158 12/19/2018   HDL 36 (L) 12/19/2018   LDLCALC 68 12/19/2018   TRIG 272 (H) 12/19/2018   CHOLHDL 4.4 12/19/2018    Significant Diagnostic Results in last 30 days:  No results found.  Assessment/Plan 1. Medication care plan discussed with patient - discharged from hospice care 10/2021 - many meds discontinued due to patient condition 06/2021 - medications reviewed with husband- agrees to plan  2. Controlled type 2 diabetes mellitus with diabetic nephropathy, without long-term current use of insulin (HCC) -  A1c  8.3 (06/09)> was 6.0 (01/13) - blood sugars 150-170's - Invokana discontinued due to genitourinary infections - cont metformin 500 mg po bid - cont asa and glipizide - A1c 03/2022  3. Microalbuminuria due to type 2 diabetes mellitus (HCC) - - urine microalbumin 151 12/13/2021 - cont lisinopril 2.5 mg po daily for kidney protection - urine microalbumin 03/2022  4. Dysuria - evaluated by urology 06/14 - symptoms resolved with fluconazole 200 mg po x 7 days   Family/ staff Communication: plan discussed with patient, husband and nurse  Labs/tests ordered:  none

## 2022-01-20 ENCOUNTER — Encounter: Payer: Self-pay | Admitting: Orthopedic Surgery

## 2022-01-20 ENCOUNTER — Non-Acute Institutional Stay (SKILLED_NURSING_FACILITY): Payer: Medicare PPO | Admitting: Orthopedic Surgery

## 2022-01-20 DIAGNOSIS — Z0000 Encounter for general adult medical examination without abnormal findings: Secondary | ICD-10-CM

## 2022-01-20 NOTE — Patient Instructions (Signed)
  Ms. Troiani , Thank you for taking time to come for your Medicare Wellness Visit. I appreciate your ongoing commitment to your health goals. Please review the following plan we discussed and let me know if I can assist you in the future.   These are the goals we discussed:  Goals      declined     Patient declined healthcare goal today.      Maintain Mobility and Function     Evidence-based guidance:  Emphasize the importance of physical activity and aerobic exercise as included in treatment plan; assess barriers to adherence; consider patient's abilities and preferences.  Encourage gradual increase in activity or exercise instead of stopping if pain occurs.  Reinforce individual therapy exercise prescription, such as strengthening, stabilization and stretching programs.  Promote optimal body mechanics to stabilize the spine with lifting and functional activity.  Encourage activity and mobility modifications to facilitate optimal function, such as using a log roll for bed mobility or dressing from a seated position.  Reinforce individual adaptive equipment recommendations to limit excessive spinal movements, such as a Systems analyst.  Assess adequacy of sleep; encourage use of sleep hygiene techniques, such as bedtime routine; use of white noise; dark, cool bedroom; avoiding daytime naps, heavy meals or exercise before bedtime.  Promote positions and modification to optimize sleep and sexual activity; consider pillows or positioning devices to assist in maintaining neutral spine.  Explore options for applying ergonomic principles at work and home, such as frequent position changes, using ergonomically designed equipment and working at optimal height.  Promote modifications to increase comfort with driving such as lumbar support, optimizing seat and steering wheel position, using cruise control and taking frequent rest stops to stretch and walk.   Notes:         This is a list of  the screening recommended for you and due dates:  Health Maintenance  Topic Date Due   Eye exam for diabetics  11/29/2017   Tetanus Vaccine  07/11/2019   Flu Shot  02/07/2022   Hemoglobin A1C  06/17/2022   Complete foot exam   08/24/2022   Pneumonia Vaccine  Completed   DEXA scan (bone density measurement)  Completed   COVID-19 Vaccine  Completed   Zoster (Shingles) Vaccine  Completed   HPV Vaccine  Aged Out

## 2022-01-20 NOTE — Progress Notes (Signed)
Subjective:   [name] [name] is a 81 y.o. female who presents for Medicare Annual (Subsequent) preventive examination.  Place of Service: [facility] Provider: Windell Moulding, AGNP-C   Review of Systems     Cardiac Risk Factors include: sedentary lifestyle;hypertension;diabetes mellitus;advanced age (>60mn, >>22women)     Objective:    Today's Vitals   01/20/22 1220  BP: 125/72  Pulse: 69  Resp: 18  Temp: (!) 95.5 F (35.3 C)  SpO2: 97%  Weight: 158 lb 4.8 oz (71.8 kg)  Height: '5\' 4"'  (1.626 m)   Body mass index is 27.17 kg/m.     01/20/2022   12:27 PM 01/04/2022   10:23 AM 12/16/2021    1:54 PM 12/12/2021    4:16 PM 11/29/2021   10:03 AM 11/14/2021   10:04 AM 11/08/2021   10:30 AM  Advanced Directives  Does Patient Have a Medical Advance Directive? Yes Yes Yes Yes Yes Yes Yes  Type of AParamed[place]of [place] will;Out of facility DNR (pink MOST or yellow form) H[place]Living will;Out of facility DNR (pink MOST or yellow form) HTwo [place]Living will;Out of facility DNR (pink MOST or yellow form) H[place]villeLiving will;Out of facility DNR (pink MOST or yellow form) HHopeLiving will;Out of facility DNR (pink MOST or yellow form) HInman MillsLiving will;Out of facility DNR (pink MOST or yellow form) H[place]Living will;Out of facility DNR (pink MOST or yellow form)  Does patient want to make changes to medical advance directive? No - Patient declined No - Patient declined No - Patient declined No - Patient declined No - Patient declined No - Patient declined No - Patient declined  Copy of HFortvillein Chart? Yes - validated most recent copy scanned in chart (See row information) Yes - validated most recent copy scanned in chart (See row information) Yes - validated most recent copy scanned in chart (See  row information) Yes - validated most recent copy scanned in chart (See row information) Yes - validated most recent copy scanned in chart (See row information) Yes - validated most recent copy scanned in chart (See row information) No - copy requested  Pre-existing out of facility DNR order (yellow form or pink MOST form) Yellow form placed in chart (order not valid for inpatient use);Pink MOST form placed in chart (order not valid for inpatient use) Yellow form placed in chart (order not valid for inpatient use);Pink MOST form placed in chart (order not valid for inpatient use) Yellow form placed in chart (order not valid for inpatient use);Pink MOST form placed in chart (order not valid for inpatient use) Yellow form placed in chart (order not valid for inpatient use);Pink MOST form placed in chart (order not valid for inpatient use) Yellow form placed in chart (order not valid for inpatient use);Pink MOST form placed in chart (order not valid for inpatient use) Yellow form placed in chart (order not valid for inpatient use);Pink MOST form placed in chart (order not valid for inpatient use) Yellow form placed in chart (order not valid for inpatient use);Pink MOST form placed in chart (order not valid for inpatient use)    Current Medications (verified) Outpatient Encounter Medications as of 01/20/2022  Medication Sig   amitriptyline (ELAVIL) 10 MG tablet Take 10 mg by mouth at bedtime.   aspirin 81 MG tablet Take 81 mg by mouth daily.   fexofenadine (ALLEGRA)  180 MG tablet Take 180 mg by mouth daily.   fluticasone (FLONASE) 50 MCG/ACT nasal spray Place 1 spray into both nostrils daily.   gabapentin (NEURONTIN) 100 MG capsule Take 200 mg by mouth 2 (two) times daily.   glipiZIDE (GLUCOTROL XL) 5 MG 24 hr tablet TAKE 1 TABLET EVERY DAY WITH BREAKFAST.   ibuprofen (ADVIL) 200 MG tablet Take 200 mg by mouth every 4 (four) hours as needed.   ketotifen (ZADITOR) 0.025 % ophthalmic solution Place 1 drop  into both eyes 2 (two) times daily as needed (Applu 1-2 gtts to both eyes).   lisinopril (ZESTRIL) 2.5 MG tablet Take 2.5 mg by mouth in the morning.   metFORMIN (GLUCOPHAGE) 500 MG tablet Take 1 tablet (500 mg total) by mouth 2 (two) times daily with a meal.   montelukast (SINGULAIR) 10 MG tablet Take 10 mg by mouth every morning.   sertraline (ZOLOFT) 50 MG tablet Take 50 mg by mouth daily.   TOPROL XL 100 MG 24 hr tablet TAKE 1 TABLET TWICE DAILY WITH FOOD.   zinc oxide 20 % ointment Apply 1 application  topically as needed (To buttocks after every incontinent epsiode and as needed for redness).   No facility-administered encounter medications on file as of 01/20/2022.    Allergies (verified) Fruit & vegetable daily [nutritional supplements], Onion, Tree extract, and Sulfa drugs cross reactors   History: Past Medical History:  Diagnosis Date   Allergy    [name] [name]; allergy shots weekly.     Anxiety    Arthritis    DDD lumbar spine.     Cancer ([place])    breast L x 2; skin basal cell carcinoma [name] [name]).   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   Past Surgical History:  Procedure Laterality Date   BREAST LUMPECTOMY  1996   L breast cancer   BREAST SURGERY N/A    Phreesia 12/12/2019   EYE SURGERY N/A    Phreesia 12/12/2019   MASTECTOMY  2002   Bilateral for breast cancer   VAGINAL HYSTERECTOMY  1985   DUB; uterine fibroids; ovaries intact.   Family History  Problem Relation Age of Onset   Heart disease Mother        AMI as cause of death age 80.   Heart disease Father        AMI as cause of death.   Heart disease Brother    Social History   Socioeconomic History   Marital status: Married    Spouse name: [name] [name]   Number of children: Not on file   Years of education:  Not on file   Highest education level: Master's degree (e.g., MA, MS, MEng, MEd, MSW, MBA)  Occupational History   Occupation: retired    Comment: retired  Tobacco Use   Smoking status: Never   Smokeless tobacco: Never  Vaping Use   Vaping Use: Never used  Substance and Sexual Activity   Alcohol use: No    Alcohol/week: 0.0 standard drinks of alcohol    Comment: rare   Drug use: No   Sexual activity: Never  Other Topics Concern   Not on file  Social History Narrative   09/30/19 Marital status: married x 18 years; second marriage; happily married  Children:  2 children; 3 grandchildren; no gg      Lives: with husband      Employment: retired in 2005; retired from [employer], 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.      Tobacco: never      Alcohol: rarely;       Exercise:  Silver Sneakers; Tai Chi once per week; exercises 3 days per week; balance class      ADLs: independent with ADLs; drives.  Husband does the cleaning and lifting groceries.      Advanced Directives: YES;  FULL CODE no prolonged measures.              Social Determinants of Health   Financial Resource Strain: Low Risk  (01/20/2022)   Overall Financial Resource Strain (CARDIA)    Difficulty of Paying Living Expenses: Not hard at all  Food Insecurity: No Food Insecurity (01/20/2022)   Hunger Vital Sign    Worried About Running Out of Food in the Last Year: Never true    Ran Out of Food in the Last Year: Never true  Transportation Needs: Not on file  Physical Activity: Insufficiently Active (01/20/2022)   Exercise Vital Sign    Days of Exercise per Week: 7 days    Minutes of Exercise per Session: 20 min  Stress: No Stress Concern Present (01/20/2022)   [place]    Feeling of Stress : Not at all  Social Connections: Moderately Isolated (01/20/2022)   Social Connection and Isolation Panel [NHANES]    Frequency of Communication with Friends and  Family: Three times a week    Frequency of Social Gatherings with Friends and Family: Three times a week    Attends Religious Services: Never    Active Member of Clubs or Organizations: No    Attends Archivist Meetings: Never    Marital Status: Married    Tobacco Counseling Counseling given: Not Answered   Clinical Intake:  Pre-visit preparation completed: Yes  Pain : No/denies pain     BMI - recorded: 27.17 Nutritional Status: BMI 25 -29 Overweight Nutritional Risks: None Diabetes: Yes CBG done?: Yes CBG resulted in Enter/ Edit results?: Yes Did pt. bring in CBG monitor from home?: No  How often do you need to have someone help you when you read instructions, pamphlets, or other written materials from your doctor or pharmacy?: 3 - Sometimes What is the last grade level you completed in school?: Masters degree  Diabetic?Yes  Interpreter Needed?: No      Activities of Daily Living    01/20/2022   12:54 PM  In your present state of health, do you have any difficulty performing the following activities:  Hearing? 0  Vision? 0  Difficulty concentrating or making decisions? 1  Walking or climbing stairs? 1  Dressing or bathing? 0  Doing errands, shopping? 0  Preparing Food and eating ? Y  Using the Toilet? Y  In the past six months, have you accidently leaked urine? Y  Do you have problems with loss of bowel control? N  Managing your Medications? Y  Managing your Finances? Y  Housekeeping or managing your Housekeeping? Y    Patient Care Team: Virgie Dad, MD as PCP - General (Internal Medicine) [name], MD (Ophthalmology) [name], MD as Consulting Physician (General Surgery) [name], MD as Consulting Physician (Internal Medicine)  Indicate any recent Medical Services you may have received from other than [facility] providers in  the past year (date may be approximate).     Assessment:   This is a routine wellness  examination for Willia.  Hearing/Vision screen No results found.  Dietary issues and exercise activities discussed: Current Exercise Habits: The patient does not participate in regular exercise at present, Exercise limited by: neurologic condition(s)   Goals Addressed             This Visit's Progress    Maintain Mobility and Function   On track    Evidence-based guidance:  Emphasize the importance of physical activity and aerobic exercise as included in treatment plan; assess barriers to adherence; consider patient's abilities and preferences.  Encourage gradual increase in activity or exercise instead of stopping if pain occurs.  Reinforce individual therapy exercise prescription, such as strengthening, stabilization and stretching programs.  Promote optimal body mechanics to stabilize the spine with lifting and functional activity.  Encourage activity and mobility modifications to facilitate optimal function, such as using a log roll for bed mobility or dressing from a seated position.  Reinforce individual adaptive equipment recommendations to limit excessive spinal movements, such as a Systems analyst.  Assess adequacy of sleep; encourage use of sleep hygiene techniques, such as bedtime routine; use of white noise; dark, cool bedroom; avoiding daytime naps, heavy meals or exercise before bedtime.  Promote positions and modification to optimize sleep and sexual activity; consider pillows or positioning devices to assist in maintaining neutral spine.  Explore options for applying ergonomic principles at work and home, such as frequent position changes, using ergonomically designed equipment and working at optimal height.  Promote modifications to increase comfort with driving such as lumbar support, optimizing seat and steering wheel position, using cruise control and taking frequent rest stops to stretch and walk.   Notes:        Depression Screen    12/25/2019    2:02 PM  12/15/2019    4:00 PM 08/12/2019    1:34 PM 12/23/2018   12:00 PM 11/12/2017    2:22 PM 05/11/2017    2:36 PM 05/07/2017    3:31 PM  PHQ 2/9 Scores  PHQ - 2 Score 0 0 0 0 0 0 0    Fall Risk    01/20/2022   12:54 PM 12/25/2019    2:02 PM 12/15/2019    4:00 PM 09/30/2019    1:24 PM 12/23/2018   12:00 PM  Pax in the past year? 1 0 0 1 0  Number falls in past yr: 0 0  0 0  Injury with Fall? 0 0  0 0  Risk for fall due to : History of fall(s);Impaired balance/gait;Impaired mobility      Follow up Falls evaluation completed;Education provided;Falls prevention discussed Falls evaluation completed;Education provided Falls evaluation completed      FALL RISK PREVENTION PERTAINING TO THE HOME:  Any stairs in or around the home? No  If so, are there any without handrails? No  Home free of loose throw rugs in walkways, pet beds, electrical cords, etc? Yes  Adequate lighting in your home to reduce risk of falls? Yes   ASSISTIVE DEVICES UTILIZED TO PREVENT FALLS:  Life alert? No  Use of a cane, walker or w/c? Yes  Grab bars in the bathroom? Yes  Shower chair or bench in shower? Yes  Elevated toilet seat or a handicapped toilet? Yes   TIMED UP AND GO:  Was the test performed? No .  Length of time to ambulate  10 feet: N/A sec.   Gait slow and steady with assistive device  Cognitive Function:    01/20/2022   12:54 PM 09/30/2019    1:24 PM  MMSE - Mini Mental State Exam  Orientation to time 4 5  Orientation to Place 5 4  Registration 3 3  Attention/ Calculation 4 3  Recall 2 3  Language- name 2 objects 2 2  Language- repeat 1 0  Language- follow 3 step command 3 3  Language- read & follow direction 1 1  Write a sentence 1 1  Copy design 1 0  Total score 27 25        01/20/2022   12:55 PM 12/25/2019    2:00 PM 05/11/2017    2:43 PM  6CIT Screen  What Year? 0 points 0 points 0 points  What month? 0 points 0 points 0 points  What time? 0 points 0 points 0 points   Count back from 20 0 points 0 points 0 points  Months in reverse 0 points 0 points 0 points  Repeat phrase 0 points 0 points 0 points  Total Score 0 points 0 points 0 points    Immunizations Immunization History  Administered Date(s) Administered   Influenza Split 03/19/2012, 03/19/2013   Influenza, High Dose Seasonal PF 03/29/2018   Influenza,inj,Quad PF,6+ Mos 04/09/2014, 03/22/2015, 03/24/2016   Influenza-Unspecified 04/09/2017, 03/24/2018, 04/16/2020   Moderna Sars-Covid-2 Vaccination 05/19/2021   PFIZER(Purple Top)SARS-COV-2 Vaccination 08/17/2019, 09/10/2019, 04/06/2020, 11/19/2020   Pfizer Covid-19 Vaccine Bivalent Booster 59yr & up 11/18/2020   Pneumococcal Conjugate-13 11/05/2014   Pneumococcal Polysaccharide-23 07/10/2004, 12/12/2010, 05/02/2016   Pneumococcal-Unspecified 07/10/2004, 12/12/2010, 05/02/2016   Td 07/10/2009   Zoster Recombinat (Shingrix) 04/09/2017, 07/19/2017   Zoster, Live 07/10/2006    TDAP status: Due, Education has been provided regarding the importance of this vaccine. Advised may receive this vaccine at local pharmacy or Health Dept. Aware to provide a copy of the vaccination record if obtained from local pharmacy or Health Dept. Verbalized acceptance and understanding.  Flu Vaccine status: Up to date  Pneumococcal vaccine status: Up to date  Covid-19 vaccine status: Completed vaccines  Qualifies for Shingles Vaccine? Yes   Zostavax completed Yes   Shingrix Completed?: Yes  Screening Tests Health Maintenance  Topic Date Due   OPHTHALMOLOGY EXAM  11/29/2017   TETANUS/TDAP  07/11/2019   INFLUENZA VACCINE  02/07/2022   HEMOGLOBIN A1C  06/17/2022   FOOT EXAM  08/24/2022   Pneumonia Vaccine 81 Years old  Completed   DEXA SCAN  Completed   COVID-19 Vaccine  Completed   Zoster Vaccines- Shingrix  Completed   HPV VACCINES  Aged Out    Health Maintenance  Health Maintenance Due  Topic Date Due   OPHTHALMOLOGY EXAM  11/29/2017    TETANUS/TDAP  07/11/2019    Colorectal cancer screening: No longer required.   Mammogram status: No longer required due to advanced age.  Bone Density status: Completed 2020. Results reflect: Bone density results: OSTEOPENIA. Repeat every 2 years.  Lung Cancer Screening: (Low Dose CT Chest recommended if Age 466-80years, 30 pack-year currently smoking OR have quit w/in 15years.) does not qualify.   Lung Cancer Screening Referral: No  Additional Screening:  Hepatitis C Screening: does not qualify; Completed   Vision Screening: Recommended annual ophthalmology exams for early detection of glaucoma and other disorders of the eye. Is the patient up to date with their annual eye exam?  No  Who is the provider or what is the  name of the office in which the patient attends annual eye exams? unsure If pt is not established with a provider, would they like to be referred to a provider to establish care? No .   Dental Screening: Recommended annual dental exams for proper oral hygiene  Community Resource Referral / Chronic Care Management: CRR required this visit?  No   CCM required this visit?  No      Plan:     I have personally reviewed and noted the following in the patient's chart:   Medical and social history Use of alcohol, tobacco or illicit drugs  Current medications and supplements including opioid prescriptions.  Functional ability and status Nutritional status Physical activity Advanced directives List of other physicians Hospitalizations, surgeries, and ER visits in previous 12 months Vitals Screenings to include cognitive, depression, and falls Referrals and appointments  In addition, I have reviewed and discussed with patient certain preventive protocols, quality metrics, and best practice recommendations. A written personalized care plan for preventive services as well as general preventive health recommendations were provided to patient.     Yvonna Alanis,  NP   01/20/2022   Orders written for Tdap vaccine and schedule diabetic eye exam.

## 2022-02-03 ENCOUNTER — Encounter: Payer: Self-pay | Admitting: Orthopedic Surgery

## 2022-02-03 ENCOUNTER — Non-Acute Institutional Stay (SKILLED_NURSING_FACILITY): Payer: Medicare PPO | Admitting: Orthopedic Surgery

## 2022-02-03 DIAGNOSIS — E1121 Type 2 diabetes mellitus with diabetic nephropathy: Secondary | ICD-10-CM | POA: Diagnosis not present

## 2022-02-03 DIAGNOSIS — R300 Dysuria: Secondary | ICD-10-CM

## 2022-02-03 DIAGNOSIS — E1129 Type 2 diabetes mellitus with other diabetic kidney complication: Secondary | ICD-10-CM | POA: Diagnosis not present

## 2022-02-03 DIAGNOSIS — R809 Proteinuria, unspecified: Secondary | ICD-10-CM

## 2022-02-03 DIAGNOSIS — I10 Essential (primary) hypertension: Secondary | ICD-10-CM

## 2022-02-03 DIAGNOSIS — J302 Other seasonal allergic rhinitis: Secondary | ICD-10-CM

## 2022-02-03 DIAGNOSIS — F03B0 Unspecified dementia, moderate, without behavioral disturbance, psychotic disturbance, mood disturbance, and anxiety: Secondary | ICD-10-CM

## 2022-02-03 DIAGNOSIS — G629 Polyneuropathy, unspecified: Secondary | ICD-10-CM

## 2022-02-03 DIAGNOSIS — K580 Irritable bowel syndrome with diarrhea: Secondary | ICD-10-CM

## 2022-02-03 DIAGNOSIS — N3941 Urge incontinence: Secondary | ICD-10-CM

## 2022-02-03 DIAGNOSIS — F339 Major depressive disorder, recurrent, unspecified: Secondary | ICD-10-CM

## 2022-02-03 NOTE — Progress Notes (Signed)
Location:   [facility] Room Number: [place] of Service:  SNF [phone]) Provider:  Windell Moulding, NP  Virgie Dad, MD  Patient Care Team: Virgie Dad, MD as PCP - General (Internal Medicine) [name], MD (Ophthalmology) [name], MD as Consulting Physician (General Surgery) [name], MD as Consulting Physician (Internal Medicine)  Extended Emergency Contact Information Primary Emergency Contact: [name],[name] Address: [place] [address] [place] Phone: [phone] Mobile Phone: [phone] Relation: Spouse  Code Status:  DNR Goals of care: Advanced Directive information    02/03/2022    9:35 AM  Advanced Directives  Does Patient Have a Medical Advance Directive? Yes  Type of Paramedic of [place];Living will;Out of facility DNR (pink MOST or yellow form)  Does patient want to make changes to medical advance directive? No - Patient declined  Copy of [place] in Chart? Yes - validated most recent copy scanned in chart (See row information)  Pre-existing out of facility DNR order (yellow form or pink MOST form) Yellow form placed in chart (order not valid for inpatient use);Pink MOST form placed in chart (order not valid for inpatient use)     Chief Complaint  Patient presents with   Medical Management of Chronic Issues    Routine follow up    HPI:  Pt is a 81 y.o. female seen today for medical management of chronic diseases.    T2DM- A1c  8.3 (06/09)> was 6.0 (01/13), urine microalbumin 151 12/13/2021, no hypoglycemic events, blood sugars 120-150's, remains on metformin, glipizide, asa, and lisinopril, off Invokana due to frequent genitourinary infections Dementia- followed by palliative, MMSE 25/30 2021, diagnosed with neurodegenerative disorder per neurology, poor safety awareness, frequent falls- improved since starting PT/OT, no behavioral  outbursts HTN- BUN/creat 25/1.2 12/26/2021, remains on metoprolol and lisinopril Peripheral neuropathy- remains on gabapentin and amitriptyline Urge incontinence- ditropan discontinued due to dysuria  IBS- no recent episodes, rifaximin discontinued  Depression- no mood changes, remains on Zoloft and amitriptyline Allergies- stable with Allegra, Flonase and Singulair daily Dysuria- increased dysuria x 3 days, evaluated by urology- urine culture wit yeast, off Invokaka  No recent falls or injuries.   Recent blood pressures:  07/25- 103/63   07/18- 100/73  07/11- 125/72  Recent weights:  07/03- 158.3 lbs  06/01- 161.7 lbs  05/02- 160.1 lbs     Past Medical History:  Diagnosis Date   Allergy    [name]; allergy shots weekly.     Anxiety    Arthritis    DDD lumbar spine.     Cancer ([place])    breast L x 2; skin basal cell carcinoma [name]).   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   Past Surgical History:  Procedure Laterality Date   BREAST LUMPECTOMY  1996   L breast cancer   BREAST SURGERY N/A    Phreesia 12/12/2019   EYE SURGERY N/A    Phreesia 12/12/2019   MASTECTOMY  2002   Bilateral for breast cancer   VAGINAL HYSTERECTOMY  1985   DUB; uterine fibroids; ovaries intact.    Allergies  Allergen Reactions   Fruit & Vegetable Daily [Nutritional  Supplements] Diarrhea   Onion Other (See Comments)    indigestion   Tree Extract Swelling   Sulfa Drugs Cross Reactors Rash    All over the body    Allergies as of 02/03/2022       Reactions   Fruit & Vegetable Daily [nutritional Supplements] Diarrhea   Onion Other (See Comments)   indigestion   Tree Extract Swelling   Sulfa Drugs Cross Reactors Rash   All over the body        Medication List         Accurate as of February 03, 2022  9:39 AM. If you have any questions, ask your nurse or doctor.          amitriptyline 10 MG tablet Commonly known as: ELAVIL Take 10 mg by mouth at bedtime.   aspirin 81 MG tablet Take 81 mg by mouth daily.   fexofenadine 180 MG tablet Commonly known as: ALLEGRA Take 180 mg by mouth daily.   fluticasone 50 MCG/ACT nasal spray Commonly known as: FLONASE Place 1 spray into both nostrils daily.   gabapentin 100 MG capsule Commonly known as: NEURONTIN Take 200 mg by mouth 2 (two) times daily.   glipiZIDE 5 MG 24 hr tablet Commonly known as: GLUCOTROL XL TAKE 1 TABLET EVERY DAY WITH BREAKFAST.   ibuprofen 200 MG tablet Commonly known as: ADVIL Take 200 mg by mouth every 4 (four) hours as needed.   ketotifen 0.025 % ophthalmic solution Commonly known as: ZADITOR Place 1 drop into both eyes 2 (two) times daily as needed (Applu 1-2 gtts to both eyes).   lisinopril 2.5 MG tablet Commonly known as: ZESTRIL Take 2.5 mg by mouth in the morning.   metFORMIN 500 MG tablet Commonly known as: GLUCOPHAGE Take 1 tablet (500 mg total) by mouth 2 (two) times daily with a meal.   montelukast 10 MG tablet Commonly known as: SINGULAIR Take 10 mg by mouth every morning.   sertraline 50 MG tablet Commonly known as: ZOLOFT Take 50 mg by mouth daily.   Toprol XL 100 MG 24 hr tablet Generic drug: metoprolol succinate TAKE 1 TABLET TWICE DAILY WITH FOOD.   zinc oxide 20 % ointment Apply 1 application  topically as needed (To buttocks after every incontinent epsiode and as needed for redness).        Review of Systems  Constitutional:  Negative for activity change, appetite change, chills, fatigue and fever.  HENT:  Positive for rhinorrhea and sneezing. Negative for sinus pressure and trouble swallowing.   Eyes:  Negative for visual disturbance.  Respiratory:  Negative for cough, shortness of breath and wheezing.   Cardiovascular:  Negative for  chest pain and leg swelling.  Gastrointestinal:  Negative for abdominal distention, abdominal pain, constipation, diarrhea, nausea and vomiting.  Endocrine: Negative for polydipsia, polyphagia and polyuria.  Genitourinary:  Positive for frequency and urgency. Negative for dysuria, hematuria, vaginal bleeding and vaginal discharge.  Musculoskeletal:  Positive for gait problem.  Skin:  Negative for wound.  Neurological:  Positive for weakness and numbness. Negative for dizziness and headaches.  Psychiatric/Behavioral:  Positive for confusion and dysphoric mood. Negative for sleep disturbance. The patient is not nervous/anxious.     Immunization History  Administered Date(s) Administered   Influenza Split 03/19/2012, 03/19/2013   Influenza, High Dose Seasonal PF 03/29/2018   Influenza,inj,Quad PF,6+ Mos 04/09/2014, 03/22/2015, 03/24/2016   Influenza-Unspecified 04/09/2017, 03/24/2018, 04/16/2020   Moderna Sars-Covid-2 Vaccination 05/19/2021   PFIZER(Purple Top)SARS-COV-2 Vaccination 08/17/2019, 09/10/2019,  04/06/2020, 11/19/2020   Pfizer Covid-19 Vaccine Bivalent Booster 59yr & up 11/18/2020   Pneumococcal Conjugate-13 11/05/2014   Pneumococcal Polysaccharide-23 07/10/2004, 12/12/2010, 05/02/2016   Pneumococcal-Unspecified 07/10/2004, 12/12/2010, 05/02/2016   Td 07/10/2009   Zoster Recombinat (Shingrix) 04/09/2017, 07/19/2017   Zoster, Live 07/10/2006   Pertinent  Health Maintenance Due  Topic Date Due   OPHTHALMOLOGY EXAM  11/29/2017   INFLUENZA VACCINE  02/07/2022   HEMOGLOBIN A1C  06/17/2022   FOOT EXAM  08/24/2022   DEXA SCAN  Completed      09/30/2019    1:24 PM 12/15/2019    4:00 PM 12/25/2019    2:02 PM 09/18/2020   12:44 PM 01/20/2022   12:54 PM  Fall Risk  Falls in the past year? 1 0 0  1  Was there an injury with Fall? 0  0  0  Fall Risk Category Calculator 1  0  1  Fall Risk Category Low  Low  Low  Patient Fall Risk Level   Low fall risk Low fall risk High fall risk   Patient at Risk for Falls Due to     History of fall(s);Impaired balance/gait;Impaired mobility  Fall risk Follow up  Falls evaluation completed Falls evaluation completed;Education provided  Falls evaluation completed;Education provided;Falls prevention discussed   Functional Status Survey:    Vitals:   02/03/22 0935  BP: 103/63  Pulse: 80  Resp: 16  Temp: (!) 96.2 F (35.7 C)  SpO2: 92%  Weight: 158 lb 4.8 oz (71.8 kg)  Height: '5\' 4"'$  (1.626 m)   Body mass index is 27.17 kg/m. Physical Exam Vitals reviewed.  Constitutional:      General: She is not in acute distress. HENT:     Head: Normocephalic.     Right Ear: There is no impacted cerumen.     Left Ear: There is no impacted cerumen.     Nose: Nose normal.     Mouth/Throat:     Mouth: Mucous membranes are moist.  Eyes:     General:        Right eye: No discharge.        Left eye: No discharge.  Cardiovascular:     Rate and Rhythm: Normal rate and regular rhythm.     Pulses: Normal pulses.     Heart sounds: Normal heart sounds.  Pulmonary:     Effort: Pulmonary effort is normal. No respiratory distress.     Breath sounds: Normal breath sounds. No wheezing.  Abdominal:     General: Bowel sounds are normal. There is no distension.     Palpations: Abdomen is soft.     Tenderness: There is no abdominal tenderness.  Musculoskeletal:     Cervical back: Neck supple.     Right lower leg: No edema.     Left lower leg: No edema.  Skin:    General: Skin is warm and dry.     Capillary Refill: Capillary refill takes less than 2 seconds.  Neurological:     General: No focal deficit present.     Mental Status: She is alert. Mental status is at baseline.     Motor: Weakness present.     Gait: Gait abnormal.     Comments: wheelchair  Psychiatric:        Mood and Affect: Mood normal.        Behavior: Behavior normal.     Comments: Very pleasant, follows commands, alert to self/person/place     Labs reviewed: Recent  Labs    09/26/21 0000 12/26/21 0000  NA 140 138  K 4.7 3.9  CL 111* 108  CO2 20 21  BUN 22* 25*  CREATININE 1.1 1.2*  CALCIUM 8.8 8.8   Recent Labs    09/26/21 0000  AST 16  ALT 10  ALKPHOS 76  ALBUMIN 3.6   Recent Labs    09/26/21 0000  WBC 9.5  NEUTROABS 5,909.00  HGB 13.6  HCT 41  PLT 306   Lab Results  Component Value Date   TSH 3.100 08/12/2019   Lab Results  Component Value Date   HGBA1C 8.3 12/16/2021   Lab Results  Component Value Date   CHOL 158 12/19/2018   HDL 36 (L) 12/19/2018   LDLCALC 68 12/19/2018   TRIG 272 (H) 12/19/2018   CHOLHDL 4.4 12/19/2018    Significant Diagnostic Results in last 30 days:  No results found.  Assessment/Plan: 1. Controlled type 2 diabetes mellitus with diabetic nephropathy, without long-term current use of insulin (HCC) - A1c  8.3 (06/09)> was 6.0 (01/13) - no hypoglycemias, sugars 120-150's - cont metformin, glipizide, asa, lisinopril  2. Microalbuminuria due to type 2 diabetes mellitus (HCC) - urine microalbumin 151 12/13/2021 - cont lisinopril 2.5 mg daily for kidney protection  3. Moderate dementia without behavioral disturbance, psychotic disturbance, mood disturbance, or anxiety, unspecified dementia type ([place]) - no behaviors - now off hospice, followed by Palliative - cont skilled nursing  4. Primary hypertension - controlled - cont metoprolol  5. Peripheral polyneuropathy - stable with gabapentin and amitriptyline  6. Urge incontinence - off medications   7. 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 Recurrent depression ([place]) - no mood changes - cont Zoloft and amitriptyline  9. Seasonal allergies - cont Allegra, Flonase, Singulair  10. Dysuria - increased burning x 3 days - evaluated by urology- urine culture indicated yeast - start diflucan 150 mg po q72 hrs x 2 doses    Family/ staff Communication: plan discussed with patient and nurse  Labs/tests ordered:  none

## 2022-02-27 ENCOUNTER — Encounter: Payer: Self-pay | Admitting: Orthopedic Surgery

## 2022-02-27 ENCOUNTER — Non-Acute Institutional Stay (SKILLED_NURSING_FACILITY): Payer: Medicare PPO | Admitting: Orthopedic Surgery

## 2022-02-27 DIAGNOSIS — R296 Repeated falls: Secondary | ICD-10-CM | POA: Diagnosis not present

## 2022-02-27 DIAGNOSIS — E1121 Type 2 diabetes mellitus with diabetic nephropathy: Secondary | ICD-10-CM

## 2022-02-27 DIAGNOSIS — F339 Major depressive disorder, recurrent, unspecified: Secondary | ICD-10-CM

## 2022-02-27 DIAGNOSIS — F03B0 Unspecified dementia, moderate, without behavioral disturbance, psychotic disturbance, mood disturbance, and anxiety: Secondary | ICD-10-CM

## 2022-02-27 DIAGNOSIS — K580 Irritable bowel syndrome with diarrhea: Secondary | ICD-10-CM

## 2022-02-27 DIAGNOSIS — E1129 Type 2 diabetes mellitus with other diabetic kidney complication: Secondary | ICD-10-CM | POA: Diagnosis not present

## 2022-02-27 DIAGNOSIS — R809 Proteinuria, unspecified: Secondary | ICD-10-CM

## 2022-02-27 DIAGNOSIS — J302 Other seasonal allergic rhinitis: Secondary | ICD-10-CM

## 2022-02-27 DIAGNOSIS — I10 Essential (primary) hypertension: Secondary | ICD-10-CM

## 2022-02-27 DIAGNOSIS — N3941 Urge incontinence: Secondary | ICD-10-CM

## 2022-02-27 DIAGNOSIS — G629 Polyneuropathy, unspecified: Secondary | ICD-10-CM

## 2022-02-27 NOTE — Progress Notes (Signed)
Location:   [facility] Room Number: [place] of Service:  SNF [phone]) Provider:  Windell Moulding, NP  PCP: Virgie Dad, MD  Patient Care Team: Virgie Dad, MD as PCP - General (Internal Medicine) [name], MD (Ophthalmology) [name], MD as Consulting Physician (General Surgery) [name], MD as Consulting Physician (Internal Medicine)  Extended Emergency Contact Information Primary Emergency Contact: [name],[name] Address: [place] [address] [place] Phone: [phone] Mobile Phone: [phone] Relation: Spouse  Code Status:  DNR Goals of care: Advanced Directive information    02/27/2022   10:48 AM  Advanced Directives  Does Patient Have a Medical Advance Directive? Yes  Type of Paramedic of [place];Living will;Out of facility DNR (pink MOST or yellow form)  Does patient want to make changes to medical advance directive? No - Patient declined  Copy of [place] in Chart? Yes - validated most recent copy scanned in chart (See row information)     Chief Complaint  Patient presents with   Medical Management of Chronic Issues    Routine Visit.   Health Maintenance    Discuss the need for Eye exam.   Immunizations    Discuss the need for Covid Booster, Tetanus vaccine, and Influenza vaccine.    HPI:  Pt is a 81 y.o. female seen today for medical management of chronic diseases.    T2DM- A1c  8.3 (06/09)> was 6.0 (01/13), urine microalbumin 151 12/13/2021, no hypoglycemic events, blood sugars 120-150's, remains on metformin, glipizide, asa, and lisinopril, off Invokana due to frequent genitourinary infections Dementia- followed by palliative, MMSE 25/30 2021, diagnosed with neurodegenerative disorder per neurology, poor safety awareness Frequent falls- improved with PT/OT, no recent falls, ambulates with wheelchair HTN- BUN/creat 25/1.2  12/26/2021, remains on metoprolol and lisinopril Peripheral neuropathy- remains on gabapentin and amitriptyline Urge incontinence- ditropan discontinued due to dysuria  IBS- no recent episodes, rifaximin discontinued  Depression- no mood changes, remains on Zoloft and amitriptyline Allergies- stable with Allegra, Flonase and Singulair daily  Recent blood pressures:  08/17- 123/78  08/16- 138/70  08/15- 115/67  Recent weights:  08/01- 159.7 lbs  07/03- 158.3 lbs  06/01- 161.7 lbs  Past Medical History:  Diagnosis Date   Allergy    [name]; allergy shots weekly.     Anxiety    Arthritis    DDD lumbar spine.     Cancer ([place])    breast L x 2; skin basal cell carcinoma [name]).   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   Past Surgical History:  Procedure Laterality Date   BREAST LUMPECTOMY  1996   L breast cancer   BREAST SURGERY N/A    Phreesia 12/12/2019   EYE SURGERY N/A    Phreesia 12/12/2019   MASTECTOMY  2002   Bilateral for breast cancer   VAGINAL HYSTERECTOMY  1985   DUB; uterine fibroids; ovaries intact.    Allergies  Allergen Reactions   Fruit & Vegetable Daily [Nutritional Supplements] Diarrhea   Mixed Ragweed    Onion Other (See Comments)    indigestion   Tree Extract Swelling   Sulfa Drugs Cross Reactors Rash  All over the body    Allergies as of 02/27/2022       Reactions   Fruit & Vegetable Daily [nutritional Supplements] Diarrhea   Mixed Ragweed    Onion Other (See Comments)   indigestion   Tree Extract Swelling   Sulfa Drugs Cross Reactors Rash   All over the body        Medication List        Accurate as of February 27, 2022 10:48 AM. If you have any questions, ask your nurse or doctor.          amitriptyline 10 MG  tablet Commonly known as: ELAVIL Take 10 mg by mouth at bedtime.   aspirin 81 MG tablet Take 81 mg by mouth daily.   fexofenadine 180 MG tablet Commonly known as: ALLEGRA Take 180 mg by mouth daily.   fluticasone 50 MCG/ACT nasal spray Commonly known as: FLONASE Place 1 spray into both nostrils daily.   gabapentin 100 MG capsule Commonly known as: NEURONTIN Take 200 mg by mouth 2 (two) times daily.   glipiZIDE 5 MG 24 hr tablet Commonly known as: GLUCOTROL XL TAKE 1 TABLET EVERY DAY WITH BREAKFAST.   ibuprofen 200 MG tablet Commonly known as: ADVIL Take 200 mg by mouth every 4 (four) hours as needed.   ketotifen 0.025 % ophthalmic solution Commonly known as: ZADITOR Place 1 drop into both eyes 2 (two) times daily as needed (Applu 1-2 gtts to both eyes).   lisinopril 2.5 MG tablet Commonly known as: ZESTRIL Take 2.5 mg by mouth in the morning.   metFORMIN 500 MG tablet Commonly known as: GLUCOPHAGE Take 1 tablet (500 mg total) by mouth 2 (two) times daily with a meal.   montelukast 10 MG tablet Commonly known as: SINGULAIR Take 10 mg by mouth every morning.   sertraline 50 MG tablet Commonly known as: ZOLOFT Take 50 mg by mouth daily.   Toprol XL 100 MG 24 hr tablet Generic drug: metoprolol succinate TAKE 1 TABLET TWICE DAILY WITH FOOD.   zinc oxide 20 % ointment Apply 1 application  topically as needed (To buttocks after every incontinent epsiode and as needed for redness).        Review of Systems  Constitutional:  Negative for activity change, appetite change, chills, fatigue and fever.  HENT:  Positive for rhinorrhea. Negative for congestion and trouble swallowing.   Eyes:  Negative for visual disturbance.  Respiratory:  Negative for cough, shortness of breath and wheezing.   Cardiovascular:  Negative for chest pain and leg swelling.  Gastrointestinal:  Negative for abdominal distention, abdominal pain, constipation, diarrhea, nausea and vomiting.   Genitourinary:  Positive for urgency. Negative for dysuria, frequency and hematuria.  Musculoskeletal:  Positive for arthralgias and gait problem.  Skin:  Negative for wound.  Neurological:  Positive for weakness. Negative for dizziness and headaches.  Psychiatric/Behavioral:  Positive for confusion and dysphoric mood. Negative for sleep disturbance. The patient is not nervous/anxious.     Immunization History  Administered Date(s) Administered   Influenza Split 03/19/2012, 03/19/2013   Influenza, High Dose Seasonal PF 04/09/2014, 03/22/2015, 03/24/2016, 03/29/2018, 04/16/2020   Influenza,inj,Quad PF,6+ Mos 04/09/2014, 03/22/2015, 03/24/2016   Influenza-Unspecified 03/19/2012, 03/19/2013, 04/09/2017, 03/24/2018, 04/16/2020   Moderna Sars-Covid-2 Vaccination 05/19/2021   PFIZER(Purple Top)SARS-COV-2 Vaccination 08/17/2019, 09/10/2019, 04/06/2020, 11/19/2020   Pfizer Covid-19 Vaccine Bivalent Booster 35yr & up 11/18/2020   Pneumococcal Conjugate-13 11/05/2014   Pneumococcal Polysaccharide-23 07/10/2004, 12/12/2010, 05/02/2016   Pneumococcal-Unspecified 07/10/2004, 12/12/2010, 05/02/2016  Td 07/10/2009   Zoster Recombinat (Shingrix) 04/09/2017, 07/19/2017   Zoster, Live 07/10/2006   Pertinent  Health Maintenance Due  Topic Date Due   OPHTHALMOLOGY EXAM  11/29/2017   INFLUENZA VACCINE  02/07/2022   HEMOGLOBIN A1C  06/17/2022   FOOT EXAM  08/24/2022   DEXA SCAN  Completed      09/30/2019    1:24 PM 12/15/2019    4:00 PM 12/25/2019    2:02 PM 09/18/2020   12:44 PM 01/20/2022   12:54 PM  Fall Risk  Falls in the past year? 1 0 0  1  Was there an injury with Fall? 0  0  0  Fall Risk Category Calculator 1  0  1  Fall Risk Category Low  Low  Low  Patient Fall Risk Level   Low fall risk Low fall risk High fall risk  Patient at Risk for Falls Due to     History of fall(s);Impaired balance/gait;Impaired mobility  Fall risk Follow up  Falls evaluation completed Falls evaluation  completed;Education provided  Falls evaluation completed;Education provided;Falls prevention discussed   Functional Status Survey:    Vitals:   02/27/22 1041  BP: 123/78  Pulse: 75  Resp: 20  Temp: (!) 97.2 F (36.2 C)  SpO2: 99%  Weight: 159 lb 11.2 oz (72.4 kg)  Height: '5\' 4"'$  (1.626 m)   Body mass index is 27.41 kg/m. Physical Exam Vitals reviewed.  Constitutional:      General: She is not in acute distress. HENT:     Head: Normocephalic.     Right Ear: There is no impacted cerumen.     Left Ear: There is no impacted cerumen.     Nose: Nose normal.     Mouth/Throat:     Mouth: Mucous membranes are moist.  Eyes:     General:        Right eye: No discharge.        Left eye: No discharge.  Cardiovascular:     Rate and Rhythm: Normal rate and regular rhythm.     Pulses: Normal pulses.     Heart sounds: Normal heart sounds.  Pulmonary:     Effort: Pulmonary effort is normal. No respiratory distress.     Breath sounds: Normal breath sounds. No wheezing.  Abdominal:     General: Bowel sounds are normal. There is no distension.     Palpations: Abdomen is soft.     Tenderness: There is no abdominal tenderness.  Musculoskeletal:     Cervical back: Neck supple.     Right lower leg: No edema.     Left lower leg: No edema.  Skin:    General: Skin is warm and dry.     Capillary Refill: Capillary refill takes less than 2 seconds.  Neurological:     General: No focal deficit present.     Mental Status: She is alert. Mental status is at baseline.     Motor: Weakness present.     Gait: Gait abnormal.     Comments: wheelchair  Psychiatric:        Mood and Affect: Mood normal.        Behavior: Behavior normal.     Comments: Very pleasant, follows commands, alert to self/person/situation     Labs reviewed: Recent Labs    09/26/21 0000 12/26/21 0000  NA 140 138  K 4.7 3.9  CL 111* 108  CO2 20 21  BUN 22* 25*  CREATININE 1.1 1.2*  CALCIUM  8.8 8.8   Recent Labs     09/26/21 0000  AST 16  ALT 10  ALKPHOS 76  ALBUMIN 3.6   Recent Labs    09/26/21 0000  WBC 9.5  NEUTROABS 5,909.00  HGB 13.6  HCT 41  PLT 306   Lab Results  Component Value Date   TSH 3.100 08/12/2019   Lab Results  Component Value Date   HGBA1C 8.3 12/16/2021   Lab Results  Component Value Date   CHOL 158 12/19/2018   HDL 36 (L) 12/19/2018   LDLCALC 68 12/19/2018   TRIG 272 (H) 12/19/2018   CHOLHDL 4.4 12/19/2018    Significant Diagnostic Results in last 30 days:  No results found.  Assessment/Plan 1. Controlled type 2 diabetes mellitus with diabetic nephropathy, without long-term current use of insulin (HCC) -  A1c  8.3 (06/09)> was 6.0 (01/13) - no hypoglycemias, sugars 120-150's - cont metformin, glipizide, asa, lisinopril - awaiting diabetic eye exam with in-house provider - A1c 03/02/2022   2. Microalbuminuria due to type 2 diabetes mellitus (HCC) - urine microalbumin 151 12/13/2021 - cont lisinopril 2.5 mg daily for kidney protection   3. Moderate dementia without behavioral disturbance, psychotic disturbance, mood disturbance, or anxiety, unspecified dementia type ([place]) - followed by Palliative - cont skilled nursing  4. Frequent falls - improved  - cont PT/OT  5. Primary hypertension - controlled  - cont lisinopril  6. Peripheral polyneuropathy - cont gabapentin and amitriptyline  7. Urge incontinence - off medications  8. Irritable bowel syndrome with diarrhea - off Rifampin  9. Recurrent depression ([place]) - no mood changes - cont Zoloft and amitriptyline  10. Seasonal allergies - cont allegra, Flonase and Singulair    Family/ staff Communication: plan discussed with patient and nurse  Labs/tests ordered:  A1c 03/02/2022

## 2022-03-14 ENCOUNTER — Encounter: Payer: Self-pay | Admitting: Orthopedic Surgery

## 2022-03-14 ENCOUNTER — Non-Acute Institutional Stay (SKILLED_NURSING_FACILITY): Payer: Medicare PPO | Admitting: Orthopedic Surgery

## 2022-03-14 ENCOUNTER — Encounter: Payer: Self-pay | Admitting: Internal Medicine

## 2022-03-14 DIAGNOSIS — E1121 Type 2 diabetes mellitus with diabetic nephropathy: Secondary | ICD-10-CM | POA: Diagnosis not present

## 2022-03-14 DIAGNOSIS — G629 Polyneuropathy, unspecified: Secondary | ICD-10-CM

## 2022-03-14 DIAGNOSIS — R809 Proteinuria, unspecified: Secondary | ICD-10-CM

## 2022-03-14 DIAGNOSIS — E1129 Type 2 diabetes mellitus with other diabetic kidney complication: Secondary | ICD-10-CM

## 2022-03-14 DIAGNOSIS — R300 Dysuria: Secondary | ICD-10-CM | POA: Diagnosis not present

## 2022-03-14 DIAGNOSIS — F03B0 Unspecified dementia, moderate, without behavioral disturbance, psychotic disturbance, mood disturbance, and anxiety: Secondary | ICD-10-CM | POA: Diagnosis not present

## 2022-03-14 DIAGNOSIS — R296 Repeated falls: Secondary | ICD-10-CM

## 2022-03-14 DIAGNOSIS — K580 Irritable bowel syndrome with diarrhea: Secondary | ICD-10-CM

## 2022-03-14 DIAGNOSIS — I10 Essential (primary) hypertension: Secondary | ICD-10-CM

## 2022-03-14 DIAGNOSIS — N3941 Urge incontinence: Secondary | ICD-10-CM

## 2022-03-14 DIAGNOSIS — F339 Major depressive disorder, recurrent, unspecified: Secondary | ICD-10-CM

## 2022-03-14 NOTE — Progress Notes (Addendum)
Location:  [facility] Room Number: 26/A Place of Service:  SNF [phone]) Provider:  Yvonna Alanis, NP   Virgie Dad, MD  Patient Care Team: Virgie Dad, MD as PCP - General (Internal Medicine) [name], MD (Ophthalmology) [name], MD as Consulting Physician (General Surgery) [name], MD as Consulting Physician (Internal Medicine)  Extended Emergency Contact Information Primary Emergency Contact: [name],[name] Address: [address] [address] [place] Phone: [phone] Mobile Phone: [phone] Relation: Spouse  Code Status: DNR Goals of care: Advanced Directive information    02/27/2022   10:48 AM  Advanced Directives  Does Patient Have a Medical Advance Directive? Yes  Type of Paramedic of [place];Living will;Out of facility DNR (pink MOST or yellow form)  Does patient want to make changes to medical advance directive? No - Patient declined  Copy of [place] in Chart? Yes - validated most recent copy scanned in chart (See row information)     Chief Complaint  Patient presents with   Acute Visit    dysuria    HPI:  Pt is a 81 y.o. female seen today for acute visit due to dysuria.   Dysuria onset x 1 day. H/o intermittent dysuria. Evaluated by urology within past year. 06/15 urine culture revealed yeast. She was given fluconazole 200 mg x 7 days. She is incontinent and wears briefs. Denies flank pain, vaginal discharge or fever.   T2DM- A1c 6.9 (08/28), 8.3 (06/09)> was 6.0 (01/13), urine microalbumin 151 12/13/2021, no hypoglycemic events, blood sugars 120-150's, remains on metformin, glipizide, asa, and lisinopril, off Invokana due to frequent genitourinary infections Dementia- followed by palliative, MMSE 25/30 2021, diagnosed with neurodegenerative disorder per neurology, poor safety awareness Frequent falls- improved with PT/OT, no  recent falls, ambulates with wheelchair HTN- BUN/creat 25/1.2 12/26/2021, remains on metoprolol and lisinopril Peripheral neuropathy- remains on gabapentin and amitriptyline Urge incontinence- ditropan discontinued due to dysuria  IBS- no recent episodes, rifaximin discontinued  Depression- no mood changes, remains on Zoloft and amitriptyline    Past Medical History:  Diagnosis Date   Allergy    [name]; allergy shots weekly.     Anxiety    Arthritis    DDD lumbar spine.     Cancer ([place])    breast L x 2; skin basal cell carcinoma [name]).   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   Past Surgical History:  Procedure Laterality Date   BREAST LUMPECTOMY  1996   L breast cancer   BREAST SURGERY N/A    Phreesia 12/12/2019   EYE SURGERY N/A    Phreesia 12/12/2019   MASTECTOMY  2002   Bilateral for breast cancer   VAGINAL HYSTERECTOMY  1985   DUB; uterine fibroids; ovaries intact.    Allergies  Allergen Reactions   Fruit & Vegetable Daily [Nutritional Supplements] Diarrhea   Mixed Ragweed    Onion Other (See Comments)    indigestion   Tree Extract Swelling   Sulfa Drugs Cross Reactors Rash    All over the body    Outpatient Encounter Medications as of 03/14/2022  Medication Sig   amitriptyline (ELAVIL) 10 MG tablet Take  10 mg by mouth at bedtime.   aspirin 81 MG tablet Take 81 mg by mouth daily.   fexofenadine (ALLEGRA) 180 MG tablet Take 180 mg by mouth daily.   fluticasone (FLONASE) 50 MCG/ACT nasal spray Place 1 spray into both nostrils daily.   gabapentin (NEURONTIN) 100 MG capsule Take 200 mg by mouth 2 (two) times daily.   glipiZIDE (GLUCOTROL XL) 5 MG 24 hr tablet TAKE 1 TABLET EVERY DAY WITH BREAKFAST.   ibuprofen (ADVIL) 200 MG tablet Take 200 mg by mouth  every 4 (four) hours as needed.   ketotifen (ZADITOR) 0.025 % ophthalmic solution Place 1 drop into both eyes 2 (two) times daily as needed (Applu 1-2 gtts to both eyes).   lisinopril (ZESTRIL) 2.5 MG tablet Take 2.5 mg by mouth in the morning.   metFORMIN (GLUCOPHAGE) 500 MG tablet Take 1 tablet (500 mg total) by mouth 2 (two) times daily with a meal.   montelukast (SINGULAIR) 10 MG tablet Take 10 mg by mouth every morning.   sertraline (ZOLOFT) 50 MG tablet Take 50 mg by mouth daily.   TOPROL XL 100 MG 24 hr tablet TAKE 1 TABLET TWICE DAILY WITH FOOD.   zinc oxide 20 % ointment Apply 1 application  topically as needed (To buttocks after every incontinent epsiode and as needed for redness).   No facility-administered encounter medications on file as of 03/14/2022.    Review of Systems  Constitutional:  Negative for activity change, appetite change, chills, fatigue and fever.  HENT:  Negative for congestion and trouble swallowing.   Eyes:  Negative for visual disturbance.  Respiratory:  Negative for cough, shortness of breath and wheezing.   Cardiovascular:  Negative for chest pain and leg swelling.  Gastrointestinal:  Negative for abdominal distention, abdominal pain, constipation, diarrhea, nausea and vomiting.  Endocrine: Negative for polydipsia, polyphagia and polyuria.  Genitourinary:  Positive for dysuria and frequency. Negative for hematuria, vaginal bleeding and vaginal discharge.  Musculoskeletal:  Positive for gait problem.  Skin:  Negative for wound.  Neurological:  Positive for weakness. Negative for dizziness and headaches.  Psychiatric/Behavioral:  Positive for dysphoric mood. Negative for sleep disturbance. The patient is not nervous/anxious.     Immunization History  Administered Date(s) Administered   Influenza Split 03/19/2012, 03/19/2013   Influenza, High Dose Seasonal PF 04/09/2014, 03/22/2015, 03/24/2016, 03/29/2018, 04/16/2020   Influenza,inj,Quad PF,6+ Mos  04/09/2014, 03/22/2015, 03/24/2016   Influenza-Unspecified 03/19/2012, 03/19/2013, 04/09/2017, 03/24/2018, 04/16/2020   Moderna Sars-Covid-2 Vaccination 05/19/2021   PFIZER(Purple Top)SARS-COV-2 Vaccination 08/17/2019, 09/10/2019, 04/06/2020, 11/19/2020   Pfizer Covid-19 Vaccine Bivalent Booster 58yr & up 11/18/2020   Pneumococcal Conjugate-13 11/05/2014   Pneumococcal Polysaccharide-23 07/10/2004, 12/12/2010, 05/02/2016   Pneumococcal-Unspecified 07/10/2004, 12/12/2010, 05/02/2016   Td 07/10/2009   Zoster Recombinat (Shingrix) 04/09/2017, 07/19/2017   Zoster, Live 07/10/2006   Pertinent  Health Maintenance Due  Topic Date Due   OPHTHALMOLOGY EXAM  11/29/2017   INFLUENZA VACCINE  02/07/2022   HEMOGLOBIN A1C  06/17/2022   FOOT EXAM  08/24/2022   DEXA SCAN  Completed      09/30/2019    1:24 PM 12/15/2019    4:00 PM 12/25/2019    2:02 PM 09/18/2020   12:44 PM 01/20/2022   12:54 PM  Fall Risk  Falls in the past year? 1 0 0  1  Was there an injury with Fall? 0  0  0  Fall Risk Category Calculator 1  0  1  Fall Risk Category Low  Low  Low  Patient Fall Risk Level   Low fall risk Low fall risk High fall risk  Patient at Risk for Falls Due to     History of fall(s);Impaired balance/gait;Impaired mobility  Fall risk Follow up  Falls evaluation completed Falls evaluation completed;Education provided  Falls evaluation completed;Education provided;Falls prevention discussed   Functional Status Survey:    Vitals:   03/14/22 1052  BP: 116/65  Pulse: 75  Resp: 16  Temp: (!) 96.7 F (35.9 C)  SpO2: 96%  Weight: 162 lb 4.8 oz (73.6 kg)  Height: '5\' 4"'$  (1.626 m)   Body mass index is 27.86 kg/m. Physical Exam Vitals reviewed.  Constitutional:      General: She is not in acute distress. HENT:     Head: Normocephalic.  Eyes:     General:        Right eye: No discharge.        Left eye: No discharge.  Cardiovascular:     Rate and Rhythm: Normal rate and regular rhythm.     Pulses:  Normal pulses.     Heart sounds: Normal heart sounds.  Pulmonary:     Effort: Pulmonary effort is normal. No respiratory distress.     Breath sounds: Normal breath sounds. No wheezing.  Abdominal:     General: Bowel sounds are normal. There is no distension.     Palpations: Abdomen is soft.     Tenderness: There is no abdominal tenderness.  Musculoskeletal:     Cervical back: Neck supple.     Right lower leg: No edema.     Left lower leg: No edema.  Skin:    General: Skin is warm and dry.     Capillary Refill: Capillary refill takes less than 2 seconds.  Neurological:     General: No focal deficit present.     Mental Status: She is alert. Mental status is at baseline.     Motor: Weakness present.     Gait: Gait abnormal.     Comments: wheelchair  Psychiatric:        Mood and Affect: Mood normal.        Behavior: Behavior normal.     Labs reviewed: Recent Labs    09/26/21 0000 12/26/21 0000  NA 140 138  K 4.7 3.9  CL 111* 108  CO2 20 21  BUN 22* 25*  CREATININE 1.1 1.2*  CALCIUM 8.8 8.8   Recent Labs    09/26/21 0000  AST 16  ALT 10  ALKPHOS 76  ALBUMIN 3.6   Recent Labs    09/26/21 0000  WBC 9.5  NEUTROABS 5,909.00  HGB 13.6  HCT 41  PLT 306   Lab Results  Component Value Date   TSH 3.100 08/12/2019   Lab Results  Component Value Date   HGBA1C 8.3 12/16/2021   Lab Results  Component Value Date   CHOL 158 12/19/2018   HDL 36 (L) 12/19/2018   LDLCALC 68 12/19/2018   TRIG 272 (H) 12/19/2018   CHOLHDL 4.4 12/19/2018    Significant Diagnostic Results in last 30 days:  No results found.  Assessment/Plan 1. Dysuria - onset x 1 day, incontinent, no vaginal discharge - 06/15 culture revealed yeast- urology prescribed fluconazole 200 mg x 7  Days - cystitis vs atrophic vaginitis? - UA/culture  - start Pyridium 200 mg po TID x 2 days - encourage hydration with water  2. Controlled type 2 diabetes mellitus with diabetic nephropathy, without  long-term current use of insulin (  HCC) - A1c improved 6.9, was 8.3 - cont metformin, glipizide, asa, and lisinopril - off invokana due to frequent genitourinary infections  3. Microalbuminuria due to type 2 diabetes mellitus (HCC) - urine microalbumin 151 12/13/2021 - cont lisinopril  4. Moderate dementia without behavioral disturbance, psychotic disturbance, mood disturbance, or anxiety, unspecified dementia type ([place]) - followed by palliative - no behaviors - followed by neurology - cont skilled nursing  5. Frequent falls - cont PT  6. Primary hypertension - controlled  - cont lisinopril  7. Peripheral polyneuropathy - cont gabapentin and Elavil  8. Urge incontinence - off ditropan  9. Irritable bowel syndrome with diarrhea - no recent episodes - off rifaximin  10. Recurrent depression ([place]) - no mood changes - cont Zoloft    Family/ staff Communication: plan discussed with patient and nurse  Labs/tests ordered:  UA/culture

## 2022-03-16 ENCOUNTER — Encounter: Payer: Self-pay | Admitting: Internal Medicine

## 2022-03-16 ENCOUNTER — Non-Acute Institutional Stay (SKILLED_NURSING_FACILITY): Payer: Medicare PPO | Admitting: Internal Medicine

## 2022-03-16 DIAGNOSIS — N1831 Chronic kidney disease, stage 3a: Secondary | ICD-10-CM | POA: Diagnosis not present

## 2022-03-16 DIAGNOSIS — F03B0 Unspecified dementia, moderate, without behavioral disturbance, psychotic disturbance, mood disturbance, and anxiety: Secondary | ICD-10-CM

## 2022-03-16 DIAGNOSIS — E1121 Type 2 diabetes mellitus with diabetic nephropathy: Secondary | ICD-10-CM

## 2022-03-16 DIAGNOSIS — R309 Painful micturition, unspecified: Secondary | ICD-10-CM | POA: Diagnosis not present

## 2022-03-16 NOTE — Progress Notes (Signed)
Location: [facility] Room Number: [place] of Service:  SNF [id number])  Provider: Veleta Miners, MD  Code Status: DNR Goals of Care:     03/16/2022    1:54 PM  Advanced Directives  Does Patient Have a Medical Advance Directive? Yes  Type of Paramedic of [place];Living will;Out of facility DNR (pink MOST or yellow form)  Does patient want to make changes to medical advance directive? No - Patient declined  Copy of [place] in Chart? Yes - validated most recent copy scanned in chart (See row information)     Chief Complaint  Patient presents with   Acute Visit    Pain    HPI: Patient is a 81 y.o. female seen today for an acute visit for Pain with Micturition Lives in SNF  Has been c/o Pain in her Lower Vaginal area and Lower Pelvic area for past few months Was seen By Urology Treated with Diflucan for 7 days for Yeast infection Her pain got better but now she is again c/o Pain with micturition specially at the end.  Patient does have history of aphasia so it is hot to get complete history.  She does deny any pain in her flank area.  She has no fever or nausea or vomiting. Clean-catch UA was done which showed some occult blood packed WBC leukoesterase positive but she was nitrite negative and the culture was mixed flora  Patient also  has h/o  Type 2 Diabetes Mellitus   Neurodegenerative Dementia Seen Neurology in 03/21 MMSE 25/30 MRI Moderate atrophy. Mild progression of white matter changes most likely due to chronic microvascular ischemia Urinary Incontinence HTN [place], And Neuropathy Past Medical History:  Diagnosis Date   Allergy    [name]; allergy shots weekly.     Anxiety    Arthritis    DDD lumbar spine.     Cancer ([place])    breast L x 2; skin basal cell carcinoma [name]).   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    Past Surgical History:  Procedure Laterality Date   BREAST LUMPECTOMY  1996   L breast cancer   BREAST SURGERY N/A    Phreesia 12/12/2019   EYE SURGERY N/A    Phreesia 12/12/2019   MASTECTOMY  2002   Bilateral for breast cancer   VAGINAL HYSTERECTOMY  1985   DUB; uterine fibroids; ovaries intact.    Allergies  Allergen Reactions   Fruit & Vegetable Daily [Nutritional Supplements] Diarrhea   Mixed Ragweed    Onion Other (See Comments)    indigestion   Tree Extract Swelling   Sulfa Drugs Cross Reactors Rash    All over the body    Outpatient Encounter Medications as of 03/16/2022  Medication Sig   amitriptyline (ELAVIL) 10 MG tablet Take 10 mg by mouth at bedtime.   aspirin 81 MG tablet Take 81 mg by mouth daily.   fexofenadine (ALLEGRA) 180 MG tablet Take 180 mg by mouth daily.   fluticasone (FLONASE) 50 MCG/ACT nasal spray Place 1 spray into both nostrils daily.   gabapentin (NEURONTIN) 100 MG capsule Take 200 mg by mouth 2 (two) times daily.   glipiZIDE (GLUCOTROL XL) 5 MG 24 hr tablet TAKE  1 TABLET EVERY DAY WITH BREAKFAST.   ibuprofen (ADVIL) 200 MG tablet Take 200 mg by mouth every 4 (four) hours as needed.   ketotifen (ZADITOR) 0.025 % ophthalmic solution Place 1 drop into both eyes 2 (two) times daily as needed.   lisinopril (ZESTRIL) 2.5 MG tablet Take 2.5 mg by mouth in the morning.   metFORMIN (GLUCOPHAGE) 500 MG tablet Take 1 tablet (500 mg total) by mouth 2 (two) times daily with a meal.   montelukast (SINGULAIR) 10 MG tablet Take 10 mg by mouth every morning.   sertraline (ZOLOFT) 50 MG tablet Take 50 mg by mouth daily.   TOPROL XL 100 MG 24 hr tablet TAKE 1 TABLET TWICE DAILY WITH FOOD.   zinc oxide 20 % ointment Apply 1 application  topically as needed (To buttocks after every incontinent epsiode and as needed for redness).   No  facility-administered encounter medications on file as of 03/16/2022.    Review of Systems:  Review of Systems  Constitutional:  Negative for activity change and appetite change.  HENT: Negative.    Respiratory:  Negative for cough and shortness of breath.   Cardiovascular:  Negative for leg swelling.  Gastrointestinal:  Negative for constipation.  Genitourinary:  Positive for dysuria, pelvic pain and urgency.  Musculoskeletal:  Positive for gait problem. Negative for arthralgias and myalgias.  Skin: Negative.   Neurological:  Negative for dizziness and weakness.  Psychiatric/Behavioral:  Positive for confusion. Negative for dysphoric mood and sleep disturbance.     Health Maintenance  Topic Date Due   OPHTHALMOLOGY EXAM  11/29/2017   TETANUS/TDAP  07/11/2019   COVID-19 Vaccine (7 - Pfizer risk series) 07/14/2021   INFLUENZA VACCINE  02/07/2022   HEMOGLOBIN A1C  06/17/2022   FOOT EXAM  08/24/2022   Pneumonia Vaccine 96+ Years old  Completed   DEXA SCAN  Completed   Zoster Vaccines- Shingrix  Completed   HPV VACCINES  Aged Out    Physical Exam: Vitals:   03/16/22 1351  BP: 117/68  Pulse: 69  Resp: 18  Temp: 98 F (36.7 C)  SpO2: 97%  Weight: 162 lb 4.8 oz (73.6 kg)  Height: '5\' 4"'$  (1.626 m)   Body mass index is 27.86 kg/m. Physical Exam Vitals reviewed.  Constitutional:      Appearance: Normal appearance.  HENT:     Head: Normocephalic.     Nose: Nose normal.     Mouth/Throat:     Mouth: Mucous membranes are moist.     Pharynx: Oropharynx is clear.  Eyes:     Pupils: Pupils are equal, round, and reactive to light.  Cardiovascular:     Rate and Rhythm: Normal rate and regular rhythm.     Pulses: Normal pulses.     Heart sounds: Normal heart sounds. No murmur heard. Pulmonary:     Effort: Pulmonary effort is normal.     Breath sounds: Normal breath sounds.  Abdominal:     General: Abdomen is flat. Bowel sounds are normal.     Palpations: Abdomen is soft.   Musculoskeletal:        General: No swelling.     Cervical back: Neck supple.  Skin:    General: Skin is warm.  Neurological:     General: No focal deficit present.     Mental Status: She is alert.  Psychiatric:        Mood and Affect: Mood normal.        Thought Content: Thought content  normal.     Labs reviewed: Basic Metabolic Panel: Recent Labs    09/26/21 0000 12/26/21 0000  NA 140 138  K 4.7 3.9  CL 111* 108  CO2 20 21  BUN 22* 25*  CREATININE 1.1 1.2*  CALCIUM 8.8 8.8   Liver Function Tests: Recent Labs    09/26/21 0000  AST 16  ALT 10  ALKPHOS 76  ALBUMIN 3.6   No results for input(s): "LIPASE", "AMYLASE" in the last 8760 hours. No results for input(s): "AMMONIA" in the last 8760 hours. CBC: Recent Labs    09/26/21 0000  WBC 9.5  NEUTROABS 5,909.00  HGB 13.6  HCT 41  PLT 306   Lipid Panel: No results for input(s): "CHOL", "HDL", "LDLCALC", "TRIG", "CHOLHDL", "LDLDIRECT" in the last 8760 hours. Lab Results  Component Value Date   HGBA1C 8.3 12/16/2021    Procedures since last visit: No results found.  Assessment/Plan  1. Pain associated with micturition With Sterile Pyuria  Refer back to Urology for possible ? Cystitis D/W Husband who will go with her as it is hard for her to give history When doing In and Out cath Patient could not do it due to pain Per Nurses her vaginal area was red and raw with [employer] Discharge  Will start her on Diflucan 200 mg QD again for 1 week to see if it helps 2. Controlled type 2 diabetes mellitus with diabetic nephropathy, without long-term current use of insulin (HCC) Recent A1C was 6.9 On Amaryl and Glucophage  3. 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 Stage 3a chronic kidney disease ([place]) Will await Urology input  other wise can order [place] of kidneys in facility   Labs/tests ordered:   * No order type specified * Next appt:  Visit date not found

## 2022-03-22 ENCOUNTER — Non-Acute Institutional Stay (SKILLED_NURSING_FACILITY): Payer: Medicare PPO | Admitting: Orthopedic Surgery

## 2022-03-22 ENCOUNTER — Encounter: Payer: Self-pay | Admitting: Orthopedic Surgery

## 2022-03-22 DIAGNOSIS — E1121 Type 2 diabetes mellitus with diabetic nephropathy: Secondary | ICD-10-CM | POA: Diagnosis not present

## 2022-03-22 DIAGNOSIS — R062 Wheezing: Secondary | ICD-10-CM

## 2022-03-22 DIAGNOSIS — F339 Major depressive disorder, recurrent, unspecified: Secondary | ICD-10-CM

## 2022-03-22 DIAGNOSIS — R300 Dysuria: Secondary | ICD-10-CM | POA: Diagnosis not present

## 2022-03-22 DIAGNOSIS — J029 Acute pharyngitis, unspecified: Secondary | ICD-10-CM | POA: Diagnosis not present

## 2022-03-22 DIAGNOSIS — R296 Repeated falls: Secondary | ICD-10-CM

## 2022-03-22 DIAGNOSIS — F03B0 Unspecified dementia, moderate, without behavioral disturbance, psychotic disturbance, mood disturbance, and anxiety: Secondary | ICD-10-CM

## 2022-03-22 DIAGNOSIS — G629 Polyneuropathy, unspecified: Secondary | ICD-10-CM

## 2022-03-22 DIAGNOSIS — I10 Essential (primary) hypertension: Secondary | ICD-10-CM

## 2022-03-22 DIAGNOSIS — N3941 Urge incontinence: Secondary | ICD-10-CM

## 2022-03-22 DIAGNOSIS — K580 Irritable bowel syndrome with diarrhea: Secondary | ICD-10-CM

## 2022-03-22 MED ORDER — GUAIFENESIN ER 600 MG PO TB12: 600.0000 mg | ORAL_TABLET | Freq: Two times a day (BID) | ORAL | 0 refills | Status: AC

## 2022-03-22 MED ORDER — CEPACOL REGULAR STRENGTH 3 MG MT LOZG: 1.0000 | LOZENGE | OROMUCOSAL | 12 refills | Status: DC | PRN

## 2022-03-22 MED ORDER — ALBUTEROL SULFATE (2.5 MG/3ML) 0.083% IN NEBU: 2.5000 mg | INHALATION_SOLUTION | Freq: Two times a day (BID) | RESPIRATORY_TRACT | 1 refills | Status: DC | PRN

## 2022-03-22 NOTE — Progress Notes (Signed)
Location:  [facility] Room Number: 26/A Place of Service:  SNF [phone]) Provider:  Yvonna Alanis, NP   Virgie Dad, MD  Patient Care Team: Virgie Dad, MD as PCP - General (Internal Medicine) [name], MD (Ophthalmology) [name], MD as Consulting Physician (General Surgery) [name], MD as Consulting Physician (Internal Medicine)  Extended Emergency Contact Information Primary Emergency Contact: [name],[name] Address: [place] [address] [place] Phone: [phone] Mobile Phone: [phone] Relation: Spouse  Code Status:  DNR Goals of care: Advanced Directive information    03/16/2022    1:54 PM  Advanced Directives  Does Patient Have a Medical Advance Directive? Yes  Type of Paramedic of [place];Living will;Out of facility DNR (pink MOST or yellow form)  Does patient want to make changes to medical advance directive? No - Patient declined  Copy of [place] in Chart? Yes - validated most recent copy scanned in chart (See row information)     Chief Complaint  Patient presents with   Acute Visit    cough    HPI:  Pt is a 81 y.o. female seen today for acute visit due to cough.   09/12 she c/o non productive cough, malaise and sore throat. Rapid covid test negative. On call ordered CXR- negative for acute cardiopulmonary disease, no infiltrates. She was also started on mucinex. Denies sore throat today. Afebrile. Vitals stable.   Intermittent dysuria resolved at this time. Prescribed Diflucan 200 mg x 1 week, one day left. Urology scheduled 09/15.   T2DM- A1c 6.9 (08/28), 8.3 (06/09)> was 6.0 (01/13), urine microalbumin 151 12/13/2021, no hypoglycemic events, blood sugars 120-150's, remains on metformin, glipizide, asa, and lisinopril, off Invokana due to frequent genitourinary infections Dementia- followed by palliative, MMSE 25/30 2021,  diagnosed with neurodegenerative disorder per neurology, poor safety awareness Frequent falls- improved with PT/OT, no recent falls, ambulates with wheelchair HTN- BUN/creat 25/1.2 12/26/2021, remains on metoprolol and lisinopril Peripheral neuropathy- remains on gabapentin and amitriptyline Urge incontinence- ditropan discontinued due to dysuria  IBS- no recent episodes, rifaximin discontinued  Depression- no mood changes, remains on Zoloft and amitriptyline  Past Medical History:  Diagnosis Date   Allergy    [name]; allergy shots weekly.     Anxiety    Arthritis    DDD lumbar spine.     Cancer ([place])    breast L x 2; skin basal cell carcinoma [name]).   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   Past Surgical History:  Procedure Laterality Date   BREAST LUMPECTOMY  1996   L breast cancer   BREAST SURGERY N/A    Phreesia 12/12/2019   EYE SURGERY N/A    Phreesia 12/12/2019   MASTECTOMY  2002   Bilateral for breast cancer   VAGINAL HYSTERECTOMY  1985   DUB; uterine fibroids; ovaries intact.    Allergies  Allergen Reactions   Fruit & Vegetable Daily [Nutritional Supplements] Diarrhea   Mixed Ragweed    Onion Other (See Comments)    indigestion   Tree Extract Swelling   Sulfa Drugs Cross Reactors Rash    All over the body  Outpatient Encounter Medications as of 03/22/2022  Medication Sig   amitriptyline (ELAVIL) 10 MG tablet Take 10 mg by mouth at bedtime.   aspirin 81 MG tablet Take 81 mg by mouth daily.   fexofenadine (ALLEGRA) 180 MG tablet Take 180 mg by mouth daily.   fluticasone (FLONASE) 50 MCG/ACT nasal spray Place 1 spray into both nostrils daily.   gabapentin (NEURONTIN) 100 MG capsule Take 200 mg by mouth 2 (two) times daily.   glipiZIDE (GLUCOTROL XL) 5  MG 24 hr tablet TAKE 1 TABLET EVERY DAY WITH BREAKFAST.   ibuprofen (ADVIL) 200 MG tablet Take 200 mg by mouth every 4 (four) hours as needed.   ketotifen (ZADITOR) 0.025 % ophthalmic solution Place 1 drop into both eyes 2 (two) times daily as needed.   lisinopril (ZESTRIL) 2.5 MG tablet Take 2.5 mg by mouth in the morning.   metFORMIN (GLUCOPHAGE) 500 MG tablet Take 1 tablet (500 mg total) by mouth 2 (two) times daily with a meal.   montelukast (SINGULAIR) 10 MG tablet Take 10 mg by mouth every morning.   sertraline (ZOLOFT) 50 MG tablet Take 50 mg by mouth daily.   TOPROL XL 100 MG 24 hr tablet TAKE 1 TABLET TWICE DAILY WITH FOOD.   zinc oxide 20 % ointment Apply 1 application  topically as needed (To buttocks after every incontinent epsiode and as needed for redness).   No facility-administered encounter medications on file as of 03/22/2022.    Review of Systems  Unable to perform ROS: Dementia    Immunization History  Administered Date(s) Administered   Influenza Split 03/19/2012, 03/19/2013   Influenza, High Dose Seasonal PF 04/09/2014, 03/22/2015, 03/24/2016, 03/29/2018, 04/16/2020   Influenza,inj,Quad PF,6+ Mos 04/09/2014, 03/22/2015, 03/24/2016   Influenza-Unspecified 03/19/2012, 03/19/2013, 04/09/2017, 03/24/2018, 04/16/2020   Moderna Sars-Covid-2 Vaccination 05/19/2021   PFIZER(Purple Top)SARS-COV-2 Vaccination 08/17/2019, 09/10/2019, 04/06/2020, 11/19/2020   Pfizer Covid-19 Vaccine Bivalent Booster 18yr & up 11/18/2020   Pneumococcal Conjugate-13 11/05/2014   Pneumococcal Polysaccharide-23 07/10/2004, 12/12/2010, 05/02/2016   Pneumococcal-Unspecified 07/10/2004, 12/12/2010, 05/02/2016   Td 07/10/2009   Zoster Recombinat (Shingrix) 04/09/2017, 07/19/2017   Zoster, Live 07/10/2006   Pertinent  Health Maintenance Due  Topic Date Due   INFLUENZA VACCINE  02/07/2022   HEMOGLOBIN A1C  06/17/2022   FOOT EXAM  08/24/2022   OPHTHALMOLOGY EXAM  03/18/2023   DEXA SCAN   Completed      09/30/2019    1:24 PM 12/15/2019    4:00 PM 12/25/2019    2:02 PM 09/18/2020   12:44 PM 01/20/2022   12:54 PM  Fall Risk  Falls in the past year? 1 0 0  1  Was there an injury with Fall? 0  0  0  Fall Risk Category Calculator 1  0  1  Fall Risk Category Low  Low  Low  Patient Fall Risk Level   Low fall risk Low fall risk High fall risk  Patient at Risk for Falls Due to     History of fall(s);Impaired balance/gait;Impaired mobility  Fall risk Follow up  Falls evaluation completed Falls evaluation completed;Education provided  Falls evaluation completed;Education provided;Falls prevention discussed   Functional Status Survey:    Vitals:   03/22/22 1449  BP: 132/80  Pulse: 88  Resp: 18  Temp: 98 F (36.7 C)  SpO2: 96%  Weight: 162 lb 4.8 oz (73.6 kg)  Height: '5\' 4"'$  (1.626 m)   Body mass index is 27.86 kg/m. Physical Exam Vitals reviewed.  Constitutional:  General: She is not in acute distress. HENT:     Head: Normocephalic.     Right Ear: There is no impacted cerumen.     Left Ear: There is no impacted cerumen.     Nose: Rhinorrhea present.     Mouth/Throat:     Mouth: Mucous membranes are moist.     Pharynx: No posterior oropharyngeal erythema.  Eyes:     General:        Right eye: No discharge.        Left eye: No discharge.  Cardiovascular:     Rate and Rhythm: Normal rate and regular rhythm.     Pulses: Normal pulses.     Heart sounds: Normal heart sounds.  Pulmonary:     Effort: Pulmonary effort is normal. No respiratory distress.     Breath sounds: Examination of the right-upper field reveals wheezing. Examination of the left-upper field reveals wheezing. Wheezing present.     Comments: expiratory Abdominal:     General: Bowel sounds are normal. There is no distension.     Palpations: Abdomen is soft.     Tenderness: There is no abdominal tenderness.  Musculoskeletal:     Cervical back: Neck supple.     Right lower leg: No edema.      Left lower leg: No edema.  Lymphadenopathy:     Cervical: No cervical adenopathy.  Skin:    General: Skin is warm and dry.     Capillary Refill: Capillary refill takes less than 2 seconds.  Neurological:     General: No focal deficit present.     Mental Status: She is alert. Mental status is at baseline.     Motor: Weakness present.     Gait: Gait abnormal.  Psychiatric:        Mood and Affect: Mood normal.        Behavior: Behavior normal.     Labs reviewed: Recent Labs    09/26/21 0000 12/26/21 0000  NA 140 138  K 4.7 3.9  CL 111* 108  CO2 20 21  BUN 22* 25*  CREATININE 1.1 1.2*  CALCIUM 8.8 8.8   Recent Labs    09/26/21 0000  AST 16  ALT 10  ALKPHOS 76  ALBUMIN 3.6   Recent Labs    09/26/21 0000  WBC 9.5  NEUTROABS 5,909.00  HGB 13.6  HCT 41  PLT 306   Lab Results  Component Value Date   TSH 3.100 08/12/2019   Lab Results  Component Value Date   HGBA1C 8.3 12/16/2021   Lab Results  Component Value Date   CHOL 158 12/19/2018   HDL 36 (L) 12/19/2018   LDLCALC 68 12/19/2018   TRIG 272 (H) 12/19/2018   CHOLHDL 4.4 12/19/2018    Significant Diagnostic Results in last 30 days:  No results found.  Assessment/Plan 1. Wheezing - expiratory wheezing to upper lobes - 09/13 CXR no active cardiopulmonary disease or infiltrates - start albuterol nebs BID prn x 7 days - may consider prednisone taper - retest for covid  2. Sore throat - start Cepacol lozenges prn - encourage warm fluids  3. Dysuria - ? cystitis - improved after starting Diflucan 200 mg daily x 7 days - urology appointment 09/15  4. Controlled type 2 diabetes mellitus with diabetic nephropathy, without long-term current use of insulin (HCC) - A1c stable - cont metformin, glipizide, asa, and lisinopril - off invokana due to frequent genitourinary infections   5. 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 Frequent falls - cont PT - cont skilled nursing care  7. Primary hypertension - controlled - cont lisinopril  8. Peripheral polyneuropathy - cont gabapentin and Elavil  9. Urge incontinence - off ditropan  10. Irritable bowel syndrome with diarrhea - no recent episodes - off rifaximin  11. Recurrent depression ([place]) - no mood changes - cont zoloft    Family/ staff Communication: plan discussed with patient and nurse  Labs/tests ordered:  none

## 2022-04-03 ENCOUNTER — Non-Acute Institutional Stay (SKILLED_NURSING_FACILITY): Payer: Medicare PPO | Admitting: Orthopedic Surgery

## 2022-04-03 ENCOUNTER — Encounter: Payer: Self-pay | Admitting: Orthopedic Surgery

## 2022-04-03 DIAGNOSIS — R062 Wheezing: Secondary | ICD-10-CM | POA: Diagnosis not present

## 2022-04-03 DIAGNOSIS — F03B0 Unspecified dementia, moderate, without behavioral disturbance, psychotic disturbance, mood disturbance, and anxiety: Secondary | ICD-10-CM

## 2022-04-03 DIAGNOSIS — B3749 Other urogenital candidiasis: Secondary | ICD-10-CM | POA: Diagnosis not present

## 2022-04-03 DIAGNOSIS — E1121 Type 2 diabetes mellitus with diabetic nephropathy: Secondary | ICD-10-CM

## 2022-04-03 DIAGNOSIS — N3941 Urge incontinence: Secondary | ICD-10-CM

## 2022-04-03 DIAGNOSIS — F339 Major depressive disorder, recurrent, unspecified: Secondary | ICD-10-CM

## 2022-04-03 DIAGNOSIS — G629 Polyneuropathy, unspecified: Secondary | ICD-10-CM

## 2022-04-03 DIAGNOSIS — B3731 Acute candidiasis of vulva and vagina: Secondary | ICD-10-CM | POA: Diagnosis not present

## 2022-04-03 DIAGNOSIS — R296 Repeated falls: Secondary | ICD-10-CM

## 2022-04-03 DIAGNOSIS — I10 Essential (primary) hypertension: Secondary | ICD-10-CM

## 2022-04-03 MED ORDER — ALBUTEROL SULFATE HFA 108 (90 BASE) MCG/ACT IN AERS: 2.0000 | INHALATION_SPRAY | Freq: Four times a day (QID) | RESPIRATORY_TRACT | 0 refills | Status: AC | PRN

## 2022-04-03 NOTE — Progress Notes (Signed)
Location:  [facility] Room Number: 26/A Place of Service:  SNF [phone]) Provider:  Yvonna Alanis, NP   Virgie Dad, MD  Patient Care Team: Virgie Dad, MD as PCP - General (Internal Medicine) [name], MD (Ophthalmology) [name], MD as Consulting Physician (General Surgery) [name], MD as Consulting Physician (Internal Medicine)  Extended Emergency Contact Information Primary Emergency Contact: [name],[name] Address: [place] [address] [place] Phone: [phone] Mobile Phone: [phone] Relation: Spouse  Code Status:  DNR Goals of care: Advanced Directive information    03/16/2022    1:54 PM  Advanced Directives  Does Patient Have a Medical Advance Directive? Yes  Type of Paramedic of [place];Living will;Out of facility DNR (pink MOST or yellow form)  Does patient want to make changes to medical advance directive? No - Patient declined  Copy of [place] in Chart? Yes - validated most recent copy scanned in chart (See row information)     Chief Complaint  Patient presents with   Medical Management of Chronic Issues    HPI:  Pt is a 81 y.o. female seen today for medical management of chronic diseases.    Wheezing- 09/13 CXR unremarkable, symptoms improved but reports intermittent wheezing at times, completed duonebs Vaginal candida- followed by urology, clotrimazole 1% to vagina qhs started, cannot tell difference in symptoms Candiduria- 09/06 urine culture negative for growth, 09/15 hematuria noted, CT and cystoscopy recommended, remains on ampicillin x 7 days T2DM- A1c 6.9 (08/28), 8.3 (06/09)> was 6.0 (01/13), urine microalbumin 151 12/13/2021, no hypoglycemic events, blood sugars 150's, remains on metformin, glipizide, asa, and lisinopril, off Invokana due to frequent genitourinary infections Dementia- followed by palliative, MMSE  25/30 2021, diagnosed with neurodegenerative disorder per neurology, poor safety awareness Frequent falls- improved with PT/OT, no recent falls, ambulates with wheelchair HTN- BUN/creat 25/1.2 12/26/2021, remains on metoprolol and lisinopril Peripheral neuropathy- remains on gabapentin and amitriptyline Urge incontinence- ditropan discontinued due to dysuria  Depression- no mood changes, remains on Zoloft and amitriptyline  Recent blood pressures:  09/19- 156/83  09/12- 132/80  09/05- 117/68  Recent weights:  09/04- 162.3 lbs  08/01- 159.7 lbs  07/03- 158.3 lbs     Past Medical History:  Diagnosis Date   Allergy    [name]; allergy shots weekly.     Anxiety    Arthritis    DDD lumbar spine.     Cancer ([place])    breast L x 2; skin basal cell carcinoma [name]).   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   Past Surgical History:  Procedure Laterality Date   BREAST LUMPECTOMY  1996   L breast cancer   BREAST SURGERY N/A    Phreesia 12/12/2019   EYE SURGERY N/A    Phreesia 12/12/2019   MASTECTOMY  2002   Bilateral for breast cancer   VAGINAL HYSTERECTOMY  1985   DUB; uterine fibroids; ovaries intact.    Allergies  Allergen Reactions   Fruit & Vegetable Daily [Nutritional Supplements] Diarrhea   Mixed Ragweed    Onion Other (See Comments)    indigestion   Tree Extract Swelling   Sulfa  Drugs Cross Reactors Rash    All over the body    Outpatient Encounter Medications as of 04/03/2022  Medication Sig   albuterol (PROVENTIL) (2.5 MG/3ML) 0.083% nebulizer solution Take 3 mLs (2.5 mg total) by nebulization 2 (two) times daily as needed for up to 7 days for wheezing or shortness of breath.   amitriptyline (ELAVIL) 10 MG tablet Take 10 mg by mouth at bedtime.   aspirin  81 MG tablet Take 81 mg by mouth daily.   fexofenadine (ALLEGRA) 180 MG tablet Take 180 mg by mouth daily.   fluticasone (FLONASE) 50 MCG/ACT nasal spray Place 1 spray into both nostrils daily.   gabapentin (NEURONTIN) 100 MG capsule Take 200 mg by mouth 2 (two) times daily.   glipiZIDE (GLUCOTROL XL) 5 MG 24 hr tablet TAKE 1 TABLET EVERY DAY WITH BREAKFAST.   guaiFENesin (MUCINEX) 600 MG 12 hr tablet Take 1 tablet (600 mg total) by mouth 2 (two) times daily for 14 days.   ibuprofen (ADVIL) 200 MG tablet Take 200 mg by mouth every 4 (four) hours as needed.   ketotifen (ZADITOR) 0.025 % ophthalmic solution Place 1 drop into both eyes 2 (two) times daily as needed.   lisinopril (ZESTRIL) 2.5 MG tablet Take 2.5 mg by mouth in the morning.   menthol-cetylpyridinium (CEPACOL REGULAR STRENGTH) 3 MG lozenge Take 1 lozenge (3 mg total) by mouth as needed for sore throat.   metFORMIN (GLUCOPHAGE) 500 MG tablet Take 1 tablet (500 mg total) by mouth 2 (two) times daily with a meal.   montelukast (SINGULAIR) 10 MG tablet Take 10 mg by mouth every morning.   sertraline (ZOLOFT) 50 MG tablet Take 50 mg by mouth daily.   TOPROL XL 100 MG 24 hr tablet TAKE 1 TABLET TWICE DAILY WITH FOOD.   zinc oxide 20 % ointment Apply 1 application  topically as needed (To buttocks after every incontinent epsiode and as needed for redness).   No facility-administered encounter medications on file as of 04/03/2022.    Review of Systems  Constitutional:  Negative for activity change, appetite change, chills, fatigue and fever.  HENT:  Negative for congestion and trouble swallowing.   Eyes:  Negative for visual disturbance.  Respiratory:  Positive for wheezing. Negative for cough and shortness of breath.   Cardiovascular:  Negative for chest pain and leg swelling.  Gastrointestinal:  Positive for constipation. Negative for abdominal distention, abdominal pain, diarrhea, nausea and vomiting.  Genitourinary:  Positive for  dysuria, frequency and vaginal pain. Negative for hematuria, vaginal bleeding and vaginal discharge.  Musculoskeletal:  Positive for arthralgias and gait problem.  Skin:  Negative for wound.  Neurological:  Positive for weakness. Negative for dizziness and headaches.  Psychiatric/Behavioral:  Positive for confusion and dysphoric mood. Negative for sleep disturbance. The patient is not nervous/anxious.     Immunization History  Administered Date(s) Administered   Influenza Split 03/19/2012, 03/19/2013   Influenza, High Dose Seasonal PF 04/09/2014, 03/22/2015, 03/24/2016, 03/29/2018, 04/16/2020   Influenza,inj,Quad PF,6+ Mos 04/09/2014, 03/22/2015, 03/24/2016   Influenza-Unspecified 03/19/2012, 03/19/2013, 04/09/2017, 03/24/2018, 04/16/2020   Moderna Sars-Covid-2 Vaccination 05/19/2021   PFIZER(Purple Top)SARS-COV-2 Vaccination 08/17/2019, 09/10/2019, 04/06/2020, 11/19/2020   Pfizer Covid-19 Vaccine Bivalent Booster 21yr & up 11/18/2020   Pneumococcal Conjugate-13 11/05/2014   Pneumococcal Polysaccharide-23 07/10/2004, 12/12/2010, 05/02/2016   Pneumococcal-Unspecified 07/10/2004, 12/12/2010, 05/02/2016   Td 07/10/2009   Zoster Recombinat (Shingrix) 04/09/2017, 07/19/2017   Zoster, Live 07/10/2006   Pertinent  Health Maintenance Due  Topic Date Due  INFLUENZA VACCINE  02/07/2022   HEMOGLOBIN A1C  06/17/2022   FOOT EXAM  08/24/2022   OPHTHALMOLOGY EXAM  03/18/2023   DEXA SCAN  Completed      09/30/2019    1:24 PM 12/15/2019    4:00 PM 12/25/2019    2:02 PM 09/18/2020   12:44 PM 01/20/2022   12:54 PM  Fall Risk  Falls in the past year? 1 0 0  1  Was there an injury with Fall? 0  0  0  Fall Risk Category Calculator 1  0  1  Fall Risk Category Low  Low  Low  Patient Fall Risk Level   Low fall risk Low fall risk High fall risk  Patient at Risk for Falls Due to     History of fall(s);Impaired balance/gait;Impaired mobility  Fall risk Follow up  Falls evaluation completed Falls  evaluation completed;Education provided  Falls evaluation completed;Education provided;Falls prevention discussed   Functional Status Survey:    Vitals:   04/03/22 1204  BP: (!) 156/83  Pulse: 68  Resp: (!) 22  Temp: (!) 97.2 F (36.2 C)  SpO2: 94%  Weight: 162 lb 4.8 oz (73.6 kg)  Height: '5\' 4"'$  (1.626 m)   Body mass index is 27.86 kg/m. Physical Exam Vitals reviewed.  Constitutional:      General: She is not in acute distress. HENT:     Head: Normocephalic.     Right Ear: There is no impacted cerumen.     Left Ear: There is no impacted cerumen.     Nose: Nose normal.     Mouth/Throat:     Mouth: Mucous membranes are moist.  Eyes:     General:        Right eye: No discharge.        Left eye: No discharge.  Cardiovascular:     Rate and Rhythm: Normal rate and regular rhythm.     Pulses: Normal pulses.     Heart sounds: Normal heart sounds.  Pulmonary:     Effort: Pulmonary effort is normal. No respiratory distress.     Breath sounds: Examination of the right-upper field reveals wheezing. Examination of the left-upper field reveals wheezing. Wheezing present.     Comments: Expiratory wheezing Abdominal:     General: Bowel sounds are normal. There is no distension.     Palpations: Abdomen is soft.     Tenderness: There is no abdominal tenderness.  Musculoskeletal:     Cervical back: Neck supple.     Right lower leg: No edema.     Left lower leg: No edema.  Skin:    General: Skin is warm and dry.     Capillary Refill: Capillary refill takes less than 2 seconds.  Neurological:     General: No focal deficit present.     Mental Status: She is alert. Mental status is at baseline.     Motor: Weakness present.     Gait: Gait abnormal.     Comments: wheelchair  Psychiatric:        Mood and Affect: Mood normal.        Behavior: Behavior normal.     Comments: Very pleasant, followed commands, alert to self/person/place     Labs reviewed: Recent Labs     09/26/21 0000 12/26/21 0000  NA 140 138  K 4.7 3.9  CL 111* 108  CO2 20 21  BUN 22* 25*  CREATININE 1.1 1.2*  CALCIUM 8.8 8.8   Recent Labs  09/26/21 0000  AST 16  ALT 10  ALKPHOS 76  ALBUMIN 3.6   Recent Labs    09/26/21 0000  WBC 9.5  NEUTROABS 5,909.00  HGB 13.6  HCT 41  PLT 306   Lab Results  Component Value Date   TSH 3.100 08/12/2019   Lab Results  Component Value Date   HGBA1C 8.3 12/16/2021   Lab Results  Component Value Date   CHOL 158 12/19/2018   HDL 36 (L) 12/19/2018   LDLCALC 68 12/19/2018   TRIG 272 (H) 12/19/2018   CHOLHDL 4.4 12/19/2018    Significant Diagnostic Results in last 30 days:  No results found.  Assessment/Plan 1. Wheezing - improved - mild exp wheezing noted on exam - start albuterol prn - cont Allegra - albuterol (VENTOLIN HFA) 108 (90 Base) MCG/ACT inhaler; Inhale 2 puffs into the lungs every 6 (six) hours as needed for wheezing or shortness of breath.  Dispense: 8 g; Refill: 0  2. Vaginal candida - followed by urology - started on clotrimazole 1%- apply to vagina qhs - she cannot tell difference in symptoms  3. Candiduria - 09/06 urine culture negative for growth - 09/15 hematuria noted by urology - CT and cystoscopy recommended- CT completed 09/22- results pending - cont ampicillin x 7 days- complete 09/28  4. Controlled type 2 diabetes mellitus with diabetic nephropathy, without long-term current use of insulin (HCC) - A1c improved 6.9> was 8.3 - sugars 150's - no hypoglycemia - cont  metformin, glipizide, asa, and lisinopril  5. Moderate dementia without behavioral disturbance, psychotic disturbance, mood disturbance, or anxiety, unspecified dementia type ([place]) - no behaviors - followed by Palliative  6. Frequent falls - cont PT - cont skilled nursing care  7. Primary hypertension - controlled with Lisinopril  8. Peripheral polyneuropathy - cont gabapentin and Elavil  9. Urge incontinence - off  ditropan   10. 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

## 2022-05-03 ENCOUNTER — Non-Acute Institutional Stay (SKILLED_NURSING_FACILITY): Payer: Medicare PPO | Admitting: Orthopedic Surgery

## 2022-05-03 ENCOUNTER — Encounter: Payer: Self-pay | Admitting: Orthopedic Surgery

## 2022-05-03 DIAGNOSIS — G629 Polyneuropathy, unspecified: Secondary | ICD-10-CM

## 2022-05-03 DIAGNOSIS — I10 Essential (primary) hypertension: Secondary | ICD-10-CM

## 2022-05-03 DIAGNOSIS — B3731 Acute candidiasis of vulva and vagina: Secondary | ICD-10-CM

## 2022-05-03 DIAGNOSIS — B3749 Other urogenital candidiasis: Secondary | ICD-10-CM

## 2022-05-03 DIAGNOSIS — R809 Proteinuria, unspecified: Secondary | ICD-10-CM

## 2022-05-03 DIAGNOSIS — E1121 Type 2 diabetes mellitus with diabetic nephropathy: Secondary | ICD-10-CM

## 2022-05-03 DIAGNOSIS — E1129 Type 2 diabetes mellitus with other diabetic kidney complication: Secondary | ICD-10-CM

## 2022-05-03 DIAGNOSIS — R296 Repeated falls: Secondary | ICD-10-CM

## 2022-05-03 DIAGNOSIS — N3941 Urge incontinence: Secondary | ICD-10-CM

## 2022-05-03 DIAGNOSIS — F03B0 Unspecified dementia, moderate, without behavioral disturbance, psychotic disturbance, mood disturbance, and anxiety: Secondary | ICD-10-CM | POA: Diagnosis not present

## 2022-05-03 DIAGNOSIS — F339 Major depressive disorder, recurrent, unspecified: Secondary | ICD-10-CM

## 2022-05-03 NOTE — Progress Notes (Addendum)
Location:  [facility] Room Number: 26/A Place of Service:  SNF [id number]) Provider:  Yvonna Alanis, NP   Virgie Dad, MD  Patient Care Team: Virgie Dad, MD as PCP - General (Internal Medicine) [name], MD (Ophthalmology) [name], MD as Consulting Physician (General Surgery) [name], MD as Consulting Physician (Internal Medicine)  Extended Emergency Contact Information Primary Emergency Contact: [name],[name] Address: [address] [address] [place] Phone: [phone] Mobile Phone: [phone] Relation: Spouse  Code Status:  DNR Goals of care: Advanced Directive information    03/16/2022    1:54 PM  Advanced Directives  Does Patient Have a Medical Advance Directive? Yes  Type of Paramedic of [place];Living will;Out of facility DNR (pink MOST or yellow form)  Does patient want to make changes to medical advance directive? No - Patient declined  Copy of [place] in Chart? Yes - validated most recent copy scanned in chart (See row information)     Chief Complaint  Patient presents with   Medical Management of Chronic Issues    HPI:  Pt is a 81 y.o. female seen today for medical management of chronic diseases.    Vaginal candida- followed by urology, clotrimazole 1% to vagina qhs x 7 days recommended- does not think it helped, thinks fluconazole helped more  Candiduria- 09/06 urine culture negative for growth, CT and cystoscopy recommended- unclear if performed/no recent note T2DM- A1c 6.9 (08/28), 8.3 (06/09)> was 6.0 (01/13), urine microalbumin 151 12/13/2021, no hypoglycemic events, blood sugars 120-150's, remains on metformin, glipizide, asa, and lisinopril, off Invokana due to frequent genitourinary infections Dementia- followed by palliative, MMSE 25/30 2021, diagnosed with neurodegenerative disorder per neurology, poor safety  awareness Frequent falls- improved with PT/OT, no recent falls, ambulates with wheelchair HTN- BUN/creat 25/1.8 12/26/2021, remains on metoprolol and lisinopril Peripheral neuropathy- remains on gabapentin and amitriptyline Urge incontinence- ditropan discontinued due to dysuria, urology recommended Gemtesa or Myrbetriq if incontinence persists Depression- no mood changes, remains on Zoloft and amitriptyline  No recent falls or injuries.   Flu vaccine administered today per [facility], tolerated well.   Recent weights:  10/02- 160.3 lbs  09/04- 162.3 lbs  08/01- 159.7 lbs  Recent blood pressures:  10/24- 136/78  10/17- 106/69  10/10- 126/68     Past Medical History:  Diagnosis Date   Allergy    [name]; allergy shots weekly.     Anxiety    Arthritis    DDD lumbar spine.     Cancer ([place])    breast L x 2; skin basal cell carcinoma [name]).   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   Past Surgical History:  Procedure Laterality Date   BREAST LUMPECTOMY  1996   L breast cancer   BREAST SURGERY N/A    Phreesia 12/12/2019   EYE SURGERY N/A    Phreesia 12/12/2019   MASTECTOMY  2002   Bilateral for breast cancer   VAGINAL HYSTERECTOMY  1985   DUB; uterine fibroids; ovaries intact.    Allergies  Allergen Reactions   Fruit & Vegetable Daily [Nutritional Supplements] Diarrhea   Mixed Ragweed    Onion Other (  See Comments)    indigestion   Tree Extract Swelling   Sulfa Drugs Cross Reactors Rash    All over the body    Outpatient Encounter Medications as of 05/03/2022  Medication Sig   albuterol (VENTOLIN HFA) 108 (90 Base) MCG/ACT inhaler Inhale 2 puffs into the lungs every 6 (six) hours as needed for wheezing or shortness of breath.   amitriptyline (ELAVIL) 10 MG  tablet Take 10 mg by mouth at bedtime.   aspirin 81 MG tablet Take 81 mg by mouth daily.   clotrimazole (GYNE-LOTRIMIN) 1 % vaginal cream Place 1 Applicatorful vaginally at bedtime.   fexofenadine (ALLEGRA) 180 MG tablet Take 180 mg by mouth daily.   fluticasone (FLONASE) 50 MCG/ACT nasal spray Place 1 spray into both nostrils daily.   gabapentin (NEURONTIN) 100 MG capsule Take 200 mg by mouth 2 (two) times daily.   glipiZIDE (GLUCOTROL XL) 5 MG 24 hr tablet TAKE 1 TABLET EVERY DAY WITH BREAKFAST.   ibuprofen (ADVIL) 200 MG tablet Take 200 mg by mouth every 4 (four) hours as needed.   ketotifen (ZADITOR) 0.025 % ophthalmic solution Place 1 drop into both eyes 2 (two) times daily as needed.   lisinopril (ZESTRIL) 2.5 MG tablet Take 2.5 mg by mouth in the morning.   menthol-cetylpyridinium (CEPACOL REGULAR STRENGTH) 3 MG lozenge Take 1 lozenge (3 mg total) by mouth as needed for sore throat.   metFORMIN (GLUCOPHAGE) 500 MG tablet Take 1 tablet (500 mg total) by mouth 2 (two) times daily with a meal.   montelukast (SINGULAIR) 10 MG tablet Take 10 mg by mouth every morning.   sertraline (ZOLOFT) 50 MG tablet Take 50 mg by mouth daily.   TOPROL XL 100 MG 24 hr tablet TAKE 1 TABLET TWICE DAILY WITH FOOD.   zinc oxide 20 % ointment Apply 1 application  topically as needed (To buttocks after every incontinent epsiode and as needed for redness).   No facility-administered encounter medications on file as of 05/03/2022.    Review of Systems  Constitutional:  Negative for activity change, appetite change, chills, fatigue and fever.  HENT:  Negative for congestion and trouble swallowing.   Eyes:  Negative for visual disturbance.  Respiratory:  Negative for cough, shortness of breath and wheezing.   Cardiovascular:  Negative for chest pain and leg swelling.  Gastrointestinal:  Negative for abdominal distention, abdominal pain, constipation, diarrhea, nausea and vomiting.  Genitourinary:  Positive for  urgency. Negative for frequency, hematuria and vaginal discharge.  Musculoskeletal:  Positive for arthralgias and gait problem.  Skin:  Negative for wound.  Neurological:  Positive for weakness. Negative for dizziness and headaches.  Psychiatric/Behavioral:  Positive for confusion and dysphoric mood. Negative for sleep disturbance. The patient is not nervous/anxious.     Immunization History  Administered Date(s) Administered   Influenza Split 03/19/2012, 03/19/2013   Influenza, High Dose Seasonal PF 04/09/2014, 03/22/2015, 03/24/2016, 03/29/2018, 04/16/2020   Influenza,inj,Quad PF,6+ Mos 04/09/2014, 03/22/2015, 03/24/2016   Influenza-Unspecified 03/19/2012, 03/19/2013, 04/09/2017, 03/24/2018, 04/16/2020   Moderna Sars-Covid-2 Vaccination 05/19/2021   PFIZER(Purple Top)SARS-COV-2 Vaccination 08/17/2019, 09/10/2019, 04/06/2020, 11/19/2020   Pfizer Covid-19 Vaccine Bivalent Booster 17yr & up 11/18/2020   Pneumococcal Conjugate-13 11/05/2014   Pneumococcal Polysaccharide-23 07/10/2004, 12/12/2010, 05/02/2016   Pneumococcal-Unspecified 07/10/2004, 12/12/2010, 05/02/2016   Td 07/10/2009   Zoster Recombinat (Shingrix) 04/09/2017, 07/19/2017   Zoster, Live 07/10/2006   Pertinent  Health Maintenance Due  Topic Date Due   INFLUENZA VACCINE  02/07/2022   HEMOGLOBIN A1C  06/17/2022  FOOT EXAM  08/24/2022   OPHTHALMOLOGY EXAM  03/18/2023   DEXA SCAN  Completed      09/30/2019    1:24 PM 12/15/2019    4:00 PM 12/25/2019    2:02 PM 09/18/2020   12:44 PM 01/20/2022   12:54 PM  Fall Risk  Falls in the past year? 1 0 0  1  Was there an injury with Fall? 0  0  0  Fall Risk Category Calculator 1  0  1  Fall Risk Category Low  Low  Low  Patient Fall Risk Level   Low fall risk Low fall risk High fall risk  Patient at Risk for Falls Due to     History of fall(s);Impaired balance/gait;Impaired mobility  Fall risk Follow up  Falls evaluation completed Falls evaluation completed;Education provided   Falls evaluation completed;Education provided;Falls prevention discussed   Functional Status Survey:    Vitals:   05/03/22 1204  BP: 136/78  Pulse: 76  Resp: 16  Temp: 97.6 F (36.4 C)  SpO2: 95%  Weight: 160 lb 4.8 oz (72.7 kg)   Body mass index is 27.52 kg/m. Physical Exam Vitals reviewed.  Constitutional:      General: She is not in acute distress. HENT:     Head: Normocephalic.     Right Ear: There is no impacted cerumen.     Left Ear: There is no impacted cerumen.     Nose: Nose normal.     Mouth/Throat:     Mouth: Mucous membranes are moist.  Eyes:     General:        Right eye: No discharge.        Left eye: No discharge.  Cardiovascular:     Rate and Rhythm: Normal rate and regular rhythm.     Pulses: Normal pulses.     Heart sounds: Normal heart sounds.  Pulmonary:     Effort: Pulmonary effort is normal. No respiratory distress.     Breath sounds: Normal breath sounds. No wheezing or rales.  Abdominal:     General: Bowel sounds are normal. There is no distension.     Palpations: Abdomen is soft.     Tenderness: There is no abdominal tenderness.  Musculoskeletal:     Cervical back: Neck supple.     Right lower leg: No edema.     Left lower leg: No edema.  Skin:    General: Skin is warm and dry.     Capillary Refill: Capillary refill takes less than 2 seconds.  Neurological:     General: No focal deficit present.     Mental Status: She is alert. Mental status is at baseline.     Motor: Weakness present.     Gait: Gait abnormal.     Comments: wheelchair  Psychiatric:        Mood and Affect: Mood normal.        Behavior: Behavior normal.     Comments: Very pleasant, follows commands, alert to self/person/situation     Labs reviewed: Recent Labs    09/26/21 0000 12/26/21 0000  NA 140 138  K 4.7 3.9  CL 111* 108  CO2 20 21  BUN 22* 25*  CREATININE 1.1 1.2*  CALCIUM 8.8 8.8   Recent Labs    09/26/21 0000  AST 16  ALT 10  ALKPHOS 76   ALBUMIN 3.6   Recent Labs    09/26/21 0000  WBC 9.5  NEUTROABS 5,909.00  HGB 13.6  HCT  41  PLT 306   Lab Results  Component Value Date   TSH 3.100 08/12/2019   Lab Results  Component Value Date   HGBA1C 8.3 12/16/2021   Lab Results  Component Value Date   CHOL 158 12/19/2018   HDL 36 (L) 12/19/2018   LDLCALC 68 12/19/2018   TRIG 272 (H) 12/19/2018   CHOLHDL 4.4 12/19/2018    Significant Diagnostic Results in last 30 days:  No results found.  Assessment/Plan 1. Vaginal candida - followed by urology - continues to have discomfort- described as pain/itching/burning at times - will reorder clotrimazole 1%- one application to vagina qhs x 7 days - may reconsider diflucan weekly if symptoms persist- does not care for cream - may consider increasing amitriptyline to 20 mg in future  2. Candiduria - 09/06 urine culture negative for growth - 09/15 hematuria noted by urology - CT and cystoscopy recommended- CT completed 09/22- results not provided by [facility] - completed ampicillin last month  3. Controlled type 2 diabetes mellitus with diabetic nephropathy, without long-term current use of insulin (HCC) - A1c stable  - no hypoglycemia - Urine microalbumin 151 12/13/2021- recollect next month 05/2022 - cont  metformin, glipizide, asa, and lisinopril  4. 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 Frequent falls - cont PT  6. Primary hypertension - controlled - cont lisinopril  7. Peripheral polyneuropathy - cont gabapentin and amitriptyline  8. Urge incontinence - off ditropan - denies increased symptoms - urology recommended Gemtesa or Myrbetriq  9. Recurrent depression ([place]) - no mood changes - cont zoloft  10. Microalbuminuria due to type 2 diabetes mellitus ([place]) - urine  micoalbumin 151 12/13/2021 - see above    Family/ staff Communication: plan discussed with patient and nurse  Labs/tests ordered:  none

## 2022-06-08 ENCOUNTER — Non-Acute Institutional Stay (SKILLED_NURSING_FACILITY): Payer: Medicare PPO | Admitting: Internal Medicine

## 2022-06-08 ENCOUNTER — Encounter: Payer: Self-pay | Admitting: Internal Medicine

## 2022-06-08 DIAGNOSIS — R309 Painful micturition, unspecified: Secondary | ICD-10-CM

## 2022-06-08 DIAGNOSIS — N1831 Chronic kidney disease, stage 3a: Secondary | ICD-10-CM

## 2022-06-08 DIAGNOSIS — R262 Difficulty in walking, not elsewhere classified: Secondary | ICD-10-CM

## 2022-06-08 DIAGNOSIS — F03A0 Unspecified dementia, mild, without behavioral disturbance, psychotic disturbance, mood disturbance, and anxiety: Secondary | ICD-10-CM | POA: Diagnosis not present

## 2022-06-08 DIAGNOSIS — F339 Major depressive disorder, recurrent, unspecified: Secondary | ICD-10-CM

## 2022-06-08 DIAGNOSIS — I10 Essential (primary) hypertension: Secondary | ICD-10-CM | POA: Diagnosis not present

## 2022-06-08 DIAGNOSIS — G629 Polyneuropathy, unspecified: Secondary | ICD-10-CM

## 2022-06-08 DIAGNOSIS — E1121 Type 2 diabetes mellitus with diabetic nephropathy: Secondary | ICD-10-CM

## 2022-06-08 NOTE — Progress Notes (Signed)
Location:  [place] of Service:  SNF (31)  Provider:   Code Status: DNR Goals of Care:     03/16/2022    1:54 PM  Advanced Directives  Does Patient Have a Medical Advance Directive? Yes  Type of Paramedic of [place];Living will;Out of facility DNR (pink MOST or yellow form)  Does patient want to make changes to medical advance directive? No - Patient declined  Copy of [place] in Chart? Yes - validated most recent copy scanned in chart (See row information)     Chief Complaint  Patient presents with   Chronic Care Management    HPI: Patient is a 81 y.o. female seen today for medical management of chronic diseases.    Lives in SNF Type 2 Diabetes Mellitus    Neurodegenerative Dementia Seen Neurology in 03/21 MMSE 25/30 MRI Moderate atrophy. Mild progression of white matter changes most likely due to chronic microvascular ischemia Urinary Incontinence HTN [place], And Neuropathy  Continues to have Painful Micturition Seen Urology Right now deferred Cystoscopy CT scan showed small renal calculi No Neoplasm  Walks with Mod assist Unable to do transfers by herself but with assist Denies any other acute issues  .  No new Nursing issues. No Behavior issues Her weight is stable  No Falls Wt Readings from Last 3 Encounters:  06/08/22 162 lb (73.5 kg)  05/03/22 160 lb 4.8 oz (72.7 kg)  04/03/22 162 lb 4.8 oz (73.6 kg)    Past Medical History:  Diagnosis Date   Allergy    [name]; allergy shots weekly.     Anxiety    Arthritis    DDD lumbar spine.     Cancer ([place])    breast L x 2; skin basal cell carcinoma [name]).   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    Past Surgical History:   Procedure Laterality Date   BREAST LUMPECTOMY  1996   L breast cancer   BREAST SURGERY N/A    Phreesia 12/12/2019   EYE SURGERY N/A    Phreesia 12/12/2019   MASTECTOMY  2002   Bilateral for breast cancer   VAGINAL HYSTERECTOMY  1985   DUB; uterine fibroids; ovaries intact.    Allergies  Allergen Reactions   Fruit & Vegetable Daily [Nutritional Supplements] Diarrhea   Mixed Ragweed    Onion Other (See Comments)    indigestion   Tree Extract Swelling   Sulfa Drugs Cross Reactors Rash    All over the body    Outpatient Encounter Medications as of 06/08/2022  Medication Sig   metoprolol succinate (TOPROL-XL) 100 MG 24 hr tablet Take 100 mg by mouth 2 (two) times daily. Take with or immediately following a meal.   albuterol (VENTOLIN HFA) 108 (90 Base) MCG/ACT inhaler Inhale 2 puffs into the lungs every 6 (six) hours as needed for wheezing or shortness of breath.   amitriptyline (ELAVIL) 10 MG tablet Take 10 mg by mouth at bedtime.   aspirin 81 MG tablet Take 81 mg by mouth daily.   clotrimazole (GYNE-LOTRIMIN) 1 % vaginal cream Place 1 Applicatorful vaginally at bedtime.   fexofenadine (ALLEGRA) 180 MG tablet Take 180 mg by mouth daily.   fluticasone (FLONASE) 50  MCG/ACT nasal spray Place 1 spray into both nostrils daily.   gabapentin (NEURONTIN) 100 MG capsule Take 200 mg by mouth 2 (two) times daily.   glipiZIDE (GLUCOTROL XL) 5 MG 24 hr tablet TAKE 1 TABLET EVERY DAY WITH BREAKFAST.   ibuprofen (ADVIL) 200 MG tablet Take 200 mg by mouth every 4 (four) hours as needed.   ketotifen (ZADITOR) 0.025 % ophthalmic solution Place 1 drop into both eyes 2 (two) times daily as needed.   lisinopril (ZESTRIL) 2.5 MG tablet Take 2.5 mg by mouth in the morning.   menthol-cetylpyridinium (CEPACOL REGULAR STRENGTH) 3 MG lozenge Take 1 lozenge (3 mg total) by mouth as needed for sore throat.   metFORMIN (GLUCOPHAGE) 500 MG tablet Take 1 tablet (500 mg total) by mouth 2 (two) times daily with a  meal.   montelukast (SINGULAIR) 10 MG tablet Take 10 mg by mouth every morning.   sertraline (ZOLOFT) 50 MG tablet Take 50 mg by mouth daily.   zinc oxide 20 % ointment Apply 1 application  topically as needed (To buttocks after every incontinent epsiode and as needed for redness).   [DISCONTINUED] TOPROL XL 100 MG 24 hr tablet TAKE 1 TABLET TWICE DAILY WITH FOOD.   No facility-administered encounter medications on file as of 06/08/2022.    Review of Systems:  Review of Systems  Constitutional:  Negative for activity change and appetite change.  HENT: Negative.    Respiratory:  Negative for cough and shortness of breath.   Cardiovascular:  Negative for leg swelling.  Gastrointestinal:  Negative for constipation.  Genitourinary: Negative.   Musculoskeletal:  Positive for gait problem. Negative for arthralgias and myalgias.  Skin: Negative.   Neurological:  Negative for dizziness and weakness.  Psychiatric/Behavioral:  Positive for confusion. Negative for dysphoric mood and sleep disturbance.     Health Maintenance  Topic Date Due   DTaP/Tdap/Td (2 - Tdap) 07/11/2019   Diabetic kidney evaluation - Urine ACR  05/28/2021   COVID-19 Vaccine (7 - 2023-24 season) 03/10/2022   HEMOGLOBIN A1C  06/17/2022   FOOT EXAM  08/24/2022   Diabetic kidney evaluation - GFR measurement  12/27/2022   Medicare Annual Wellness (AWV)  01/21/2023   OPHTHALMOLOGY EXAM  03/18/2023   Pneumonia Vaccine 35+ Years old  Completed   INFLUENZA VACCINE  Completed   DEXA SCAN  Completed   Zoster Vaccines- Shingrix  Completed   HPV VACCINES  Aged Out    Physical Exam: Vitals:   06/08/22 1523  BP: (!) 152/71  Pulse: 69  Resp: 16  Temp: (!) 97.3 F (36.3 C)  Weight: 162 lb (73.5 kg)   Body mass index is 27.81 kg/m. Physical Exam Vitals reviewed.  Constitutional:      Appearance: Normal appearance.  HENT:     Head: Normocephalic.     Nose: Nose normal.     Mouth/Throat:     Mouth: Mucous membranes  are moist.     Pharynx: Oropharynx is clear.  Eyes:     Pupils: Pupils are equal, round, and reactive to light.  Cardiovascular:     Rate and Rhythm: Normal rate and regular rhythm.     Pulses: Normal pulses.     Heart sounds: Normal heart sounds. No murmur heard. Pulmonary:     Effort: Pulmonary effort is normal.     Breath sounds: Normal breath sounds.  Abdominal:     General: Abdomen is flat. Bowel sounds are normal.     Palpations: Abdomen is soft.  Musculoskeletal:        General: No swelling.     Cervical back: Neck supple.  Skin:    General: Skin is warm.  Neurological:     Mental Status: She is alert.  Psychiatric:        Mood and Affect: Mood normal.        Thought Content: Thought content normal.     Labs reviewed: Basic Metabolic Panel: Recent Labs    09/26/21 0000 12/26/21 0000  NA 140 138  K 4.7 3.9  CL 111* 108  CO2 20 21  BUN 22* 25*  CREATININE 1.1 1.2*  CALCIUM 8.8 8.8   Liver Function Tests: Recent Labs    09/26/21 0000  AST 16  ALT 10  ALKPHOS 76  ALBUMIN 3.6   No results for input(s): "LIPASE", "AMYLASE" in the last 8760 hours. No results for input(s): "AMMONIA" in the last 8760 hours. CBC: Recent Labs    09/26/21 0000  WBC 9.5  NEUTROABS 5,909.00  HGB 13.6  HCT 41  PLT 306   Lipid Panel: No results for input(s): "CHOL", "HDL", "LDLCALC", "TRIG", "CHOLHDL", "LDLDIRECT" in the last 8760 hours. Lab Results  Component Value Date   HGBA1C 8.3 12/16/2021    Procedures since last visit: No results found.  Assessment/Plan 1. Controlled type 2 diabetes mellitus with diabetic nephropathy, without long-term current use of insulin (HCC) Lat A1C in 08/23 was 6. On Metformin and Glipizide CBGS 150-180  2. Painful micturition No More work up for now [employer] as not sure if helping any  3. Primary hypertension BP mostly good here On Lisinopril and Toprol  4. Mild dementia without behavioral disturbance, psychotic  disturbance, mood disturbance, or anxiety, unspecified dementia type [facility]) Doing well in SNF Repeat MMSE   5. Peripheral polyneuropathy On Neurontin  6. Recurrent depression ([place]) Zoloft  7. Stage 3a chronic kidney disease (HCC) Repeat CMP  8. Unable to walk Works with therapy    Labs/tests ordered:  CBC,CMP,A1C,Lipid Next appt:  Visit date not found

## 2022-06-12 LAB — HEPATIC FUNCTION PANEL
ALT: 12 U/L (ref 7–35)
AST: 14 (ref 13–35)
Alkaline Phosphatase: 122 (ref 25–125)
Bilirubin, Total: 0.3

## 2022-06-12 LAB — CBC AND DIFFERENTIAL
HCT: 43 (ref 36–46)
Hemoglobin: 14.4 (ref 12.0–16.0)
Platelets: 381 10*3/uL (ref 150–400)
WBC: 9.8

## 2022-06-12 LAB — LIPID PANEL
Cholesterol: 191 (ref 0–200)
HDL: 45 (ref 35–70)
LDL Cholesterol: 114
LDl/HDL Ratio: 4.2
Triglycerides: 196 — AB (ref 40–160)

## 2022-06-12 LAB — BASIC METABOLIC PANEL
BUN: 25 — AB (ref 4–21)
CO2: 24 — AB (ref 13–22)
Chloride: 105 (ref 99–108)
Creatinine: 1.2 — AB (ref 0.5–1.1)
Glucose: 161
Potassium: 4.4 mEq/L (ref 3.5–5.1)
Sodium: 140 (ref 137–147)

## 2022-06-12 LAB — COMPREHENSIVE METABOLIC PANEL
Albumin: 4.2 (ref 3.5–5.0)
Calcium: 9.4 (ref 8.7–10.7)
Globulin: 2.9
eGFR: 47

## 2022-06-12 LAB — CBC: RBC: 5.18 — AB (ref 3.87–5.11)

## 2022-06-12 LAB — HEMOGLOBIN A1C: Hemoglobin A1C: 7

## 2022-06-13 ENCOUNTER — Non-Acute Institutional Stay (SKILLED_NURSING_FACILITY): Payer: Medicare PPO | Admitting: Adult Health

## 2022-06-13 ENCOUNTER — Encounter: Payer: Self-pay | Admitting: Adult Health

## 2022-06-13 DIAGNOSIS — K5901 Slow transit constipation: Secondary | ICD-10-CM

## 2022-06-13 DIAGNOSIS — K649 Unspecified hemorrhoids: Secondary | ICD-10-CM | POA: Diagnosis not present

## 2022-06-13 DIAGNOSIS — R52 Pain, unspecified: Secondary | ICD-10-CM | POA: Diagnosis not present

## 2022-06-13 MED ORDER — HYDROCORTISONE ACETATE 25 MG RE SUPP: 25.0000 mg | Freq: Every day | RECTAL | 0 refills | Status: AC | PRN

## 2022-06-13 MED ORDER — DOCUSATE SODIUM 100 MG PO CAPS: 100.0000 mg | ORAL_CAPSULE | Freq: Two times a day (BID) | ORAL | 3 refills | Status: DC

## 2022-06-13 MED ORDER — ACETAMINOPHEN 500 MG PO TABS: 1000.0000 mg | ORAL_TABLET | Freq: Three times a day (TID) | ORAL | 0 refills | Status: DC | PRN

## 2022-06-13 NOTE — Progress Notes (Signed)
Location:  [place] Room Number: [place] of Service:  SNF (31) Provider:  Durenda Age, DNP, FNP-BC  Patient Care Team: Virgie Dad, MD as PCP - General (Internal Medicine) [name], MD (Ophthalmology) [name], MD as Consulting Physician (General Surgery) [name], MD as Consulting Physician (Internal Medicine)  Extended Emergency Contact Information Primary Emergency Contact: [name],[name] Address: [address] [address] [place] Phone: [phone] Mobile Phone: [phone] Relation: Spouse  Code Status:  DNR  Goals of care: Advanced Directive information    06/13/2022    1:45 PM  Advanced Directives  Does Patient Have a Medical Advance Directive? Yes  Type of Paramedic of [place];Living will;Out of facility DNR (pink MOST or yellow form)  Does patient want to make changes to medical advance directive? No - Patient declined  Copy of [place] in Chart? Yes - validated most recent copy scanned in chart (See row information)  Pre-existing out of facility DNR order (yellow form or pink MOST form) Pink MOST/Yellow Form most recent copy in chart - Physician notified to receive inpatient order     Chief Complaint  Patient presents with   Acute Visit    Rectal bleed after bowel movement     HPI:  [name] [name] is a 81 y.o. female seen today for an acute visit regarding rectal bleed. She has just moved her BM and was noted to have bright red blood  when being wiped. Noted to have external hemorrhoids. Resident stated that she is constipated. Review of medications showed that she takes ASA EC 81 mg 2 tabs daily and Ibuprofen 400 mg 2 tabs PO Q 4 hours PRN. Resident denies abdominal pain. She has a PMH of diabetes mellitus, hypertension, IBS, GERD and insomnia.   Past Medical History:  Diagnosis Date   Allergy    [name]; allergy shots  weekly.     Anxiety    Arthritis    DDD lumbar spine.     Cancer ([place])    breast L x 2; skin basal cell carcinoma [name]).   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   Past Surgical History:  Procedure Laterality Date   BREAST LUMPECTOMY  1996   L breast cancer   BREAST SURGERY N/A    Phreesia 12/12/2019   EYE SURGERY N/A    Phreesia 12/12/2019   MASTECTOMY  2002   Bilateral for breast cancer   VAGINAL HYSTERECTOMY  1985   DUB; uterine fibroids; ovaries intact.    Allergies  Allergen Reactions   Fruit & Vegetable Daily [Nutritional Supplements] Diarrhea   Mixed Ragweed    Onion Other (See Comments)    indigestion   Tree Extract Swelling   Sulfa Drugs Cross Reactors Rash    All over the body    Outpatient Encounter Medications as of 06/13/2022  Medication Sig   albuterol (VENTOLIN HFA) 108 (90 Base) MCG/ACT inhaler Inhale 2 puffs into the lungs every 6 (six) hours as needed for wheezing or shortness of breath.   aspirin 81 MG tablet Take 81 mg by mouth daily.   clotrimazole (GYNE-LOTRIMIN) 1 % vaginal cream Place 1 Applicatorful vaginally  at bedtime.   fexofenadine (ALLEGRA) 180 MG tablet Take 180 mg by mouth daily.   fluticasone (FLONASE) 50 MCG/ACT nasal spray Place 1 spray into both nostrils daily.   gabapentin (NEURONTIN) 100 MG capsule Take 200 mg by mouth 2 (two) times daily.   glipiZIDE (GLUCOTROL XL) 5 MG 24 hr tablet TAKE 1 TABLET EVERY DAY WITH BREAKFAST.   ibuprofen (ADVIL) 200 MG tablet Take 200 mg by mouth every 4 (four) hours as needed.   ketotifen (ZADITOR) 0.025 % ophthalmic solution Place 1 drop into both eyes 2 (two) times daily as needed.   lisinopril (ZESTRIL) 2.5 MG tablet Take 2.5 mg by mouth in the morning.   menthol-cetylpyridinium (CEPACOL REGULAR  STRENGTH) 3 MG lozenge Take 1 lozenge (3 mg total) by mouth as needed for sore throat.   metFORMIN (GLUCOPHAGE) 500 MG tablet Take 1 tablet (500 mg total) by mouth 2 (two) times daily with a meal.   metoprolol succinate (TOPROL-XL) 100 MG 24 hr tablet Take 100 mg by mouth 2 (two) times daily. Take with or immediately following a meal.   montelukast (SINGULAIR) 10 MG tablet Take 10 mg by mouth every morning.   sertraline (ZOLOFT) 50 MG tablet Take 50 mg by mouth daily.   zinc oxide 20 % ointment Apply 1 application  topically as needed (To buttocks after every incontinent epsiode and as needed for redness).   [DISCONTINUED] amitriptyline (ELAVIL) 10 MG tablet Take 10 mg by mouth at bedtime.   No facility-administered encounter medications on file as of 06/13/2022.    Review of Systems  Constitutional:  Negative for appetite change, chills, fatigue and fever.  HENT:  Negative for congestion, hearing loss, rhinorrhea and sore throat.   Eyes: Negative.   Respiratory:  Negative for cough, shortness of breath and wheezing.   Cardiovascular:  Negative for chest pain, palpitations and leg swelling.  Gastrointestinal:  Positive for anal bleeding and constipation. Negative for abdominal distention, abdominal pain, diarrhea, nausea, rectal pain and vomiting.  Genitourinary:  Negative for dysuria.  Musculoskeletal:  Negative for arthralgias, back pain and myalgias.  Skin:  Negative for color change, rash and wound.  Neurological:  Negative for dizziness, weakness and headaches.  Psychiatric/Behavioral:  Negative for behavioral problems. The patient is not nervous/anxious.        Immunization History  Administered Date(s) Administered   Fluad Quad(high Dose 65+) 05/03/2022   Influenza Split 03/19/2012, 03/19/2013   Influenza, High Dose Seasonal PF 04/09/2014, 03/22/2015, 03/24/2016, 03/29/2018, 04/16/2020   Influenza,inj,Quad PF,6+ Mos 04/09/2014, 03/22/2015, 03/24/2016   Influenza-Unspecified  03/19/2012, 03/19/2013, 04/09/2017, 03/24/2018, 04/16/2020   Moderna SARS-COV2 Booster Vaccination 05/16/2022   Moderna Sars-Covid-2 Vaccination 05/19/2021   PFIZER(Purple Top)SARS-COV-2 Vaccination 08/17/2019, 09/10/2019, 04/06/2020, 11/19/2020   Pfizer Covid-19 Vaccine Bivalent Booster 60yr & up 11/18/2020   Pneumococcal Conjugate-13 11/05/2014   Pneumococcal Polysaccharide-23 07/10/2004, 12/12/2010, 05/02/2016   Pneumococcal-Unspecified 07/10/2004, 12/12/2010, 05/02/2016   Td 07/10/2009   Zoster Recombinat (Shingrix) 04/09/2017, 07/19/2017   Zoster, Live 07/10/2006   Pertinent  Health Maintenance Due  Topic Date Due   HEMOGLOBIN A1C  06/17/2022   FOOT EXAM  08/24/2022   OPHTHALMOLOGY EXAM  03/18/2023   INFLUENZA VACCINE  Completed   DEXA SCAN  Completed      09/30/2019    1:24 PM 12/15/2019    4:00 PM 12/25/2019    2:02 PM 09/18/2020   12:44 PM 01/20/2022   12:54 PM  Fall Risk  Falls in the past year? 1 0 0  1  Was there an injury with Fall? 0  0  0  Fall Risk Category Calculator 1  0  1  Fall Risk Category Low  Low  Low  Patient Fall Risk Level   Low fall risk Low fall risk High fall risk  Patient at Risk for Falls Due to     History of fall(s);Impaired balance/gait;Impaired mobility  Fall risk Follow up  Falls evaluation completed Falls evaluation completed;Education provided  Falls evaluation completed;Education provided;Falls prevention discussed     Vitals:   06/13/22 1324  BP: (!) 157/71  Pulse: 69  Resp: 16  Temp: (!) 97.3 F (36.3 C)  SpO2: 97%  Weight: 167 lb 12.8 oz (76.1 kg)  Height: '5\' 4"'$  (1.626 m)   Body mass index is 28.8 kg/m.  Physical Exam     Labs reviewed: Recent Labs    09/26/21 0000 12/26/21 0000  NA 140 138  K 4.7 3.9  CL 111* 108  CO2 20 21  BUN 22* 25*  CREATININE 1.1 1.2*  CALCIUM 8.8 8.8   Recent Labs    09/26/21 0000  AST 16  ALT 10  ALKPHOS 76  ALBUMIN 3.6   Recent Labs    09/26/21 0000  WBC 9.5  NEUTROABS  5,909.00  HGB 13.6  HCT 41  PLT 306   Lab Results  Component Value Date   TSH 3.100 08/12/2019   Lab Results  Component Value Date   HGBA1C 8.3 12/16/2021   Lab Results  Component Value Date   CHOL 158 12/19/2018   HDL 36 (L) 12/19/2018   LDLCALC 68 12/19/2018   TRIG 272 (H) 12/19/2018   CHOLHDL 4.4 12/19/2018    Significant Diagnostic Results in last 30 days:  No results found.  Assessment/Plan  1. Bleeding hemorrhoid -  bleeding is thought to be due to irritation from constipation -  discontinue ASA EC 81 mg 2 tabs daily to 1 tab daily - hydrocortisone (ANUSOL-HC) 25 MG suppository; Place 1 suppository (25 mg total) rectally daily as needed for up to 7 days for hemorrhoids or anal itching.  Dispense: 7 suppository; Refill: 0  2. Slow transit constipation - docusate sodium (COLACE) 100 MG capsule; Take 1 capsule (100 mg total) by mouth 2 (two) times daily.  Dispense: 60 capsule; Refill: 3  3. Generalized pain -  discontinue Ibuprofen 400 mg 2 tabs Q 4 hours PRN - start acetaminophen (TYLENOL) 500 MG tablet; Take 2 tablets (1,000 mg total) by mouth every 8 (eight) hours as needed.  Dispense: 30 tablet; Refill: 0    Family/ staff Communication: Discussed plan of care with resident and charge nurse  Labs/tests ordered:  None    Durenda Age, DNP, MSN, FNP-BC [facility] [phone] (Monday-Friday 8:00 a.m. - 5:00 p.m.) [phone] (after hours)

## 2022-06-20 ENCOUNTER — Encounter: Payer: Self-pay | Admitting: Internal Medicine

## 2022-06-29 ENCOUNTER — Encounter: Payer: Self-pay | Admitting: Internal Medicine

## 2022-06-29 ENCOUNTER — Non-Acute Institutional Stay (SKILLED_NURSING_FACILITY): Payer: Medicare PPO | Admitting: Internal Medicine

## 2022-06-29 DIAGNOSIS — B3731 Acute candidiasis of vulva and vagina: Secondary | ICD-10-CM

## 2022-06-29 DIAGNOSIS — R309 Painful micturition, unspecified: Secondary | ICD-10-CM | POA: Diagnosis not present

## 2022-06-29 DIAGNOSIS — E1121 Type 2 diabetes mellitus with diabetic nephropathy: Secondary | ICD-10-CM

## 2022-06-29 NOTE — Progress Notes (Signed)
Location:   [place] Room Number: 26 A Place of Service:  SNF [phone]) Provider:  Veleta Miners, MD  Virgie Dad, MD  Patient Care Team: Virgie Dad, MD as PCP - General (Internal Medicine) [name], MD (Ophthalmology) [name], MD as Consulting Physician (General Surgery) [name], MD as Consulting Physician (Internal Medicine)  Extended Emergency Contact Information Primary Emergency Contact: [name],[name] Address: [address] [address] [place] Phone: [phone] Mobile Phone: [phone] Relation: Spouse  Code Status:  DNR Goals of care: Advanced Directive information    06/29/2022    4:01 PM  Advanced Directives  Does Patient Have a Medical Advance Directive? Yes  Type of Paramedic of [place];Living will;Out of facility DNR (pink MOST or yellow form)  Does patient want to make changes to medical advance directive? No - Patient declined  Copy of [place] in Chart? Yes - validated most recent copy scanned in chart (See row information)  Pre-existing out of facility DNR order (yellow form or pink MOST form) Pink MOST form placed in chart (order not valid for inpatient use);Yellow form placed in chart (order not valid for inpatient use)     Chief Complaint  Patient presents with   Acute Visit    Vaginal rash    HPI:  Pt is a 81 y.o. female seen today for an acute visit for  Lives in SNF  Seen today as per Nurses Patient has severe Vaginal rash and she c/o Pain when they do Nystatin  Vaginal Suppositories  She also c/o Pain when they clean her and Micturate Patient has h/o recurrent Candidal  Vaginal Infection   Type 2 Diabetes Mellitus    Neurodegenerative Dementia Seen Neurology in 03/21 MMSE 25/30 MRI Moderate atrophy. Mild progression of white matter changes most likely due to chronic microvascular ischemia Urinary  Incontinence HTN Insmonia, And Neuropathy  Past Medical History:  Diagnosis Date   Allergy    [name]; allergy shots weekly.     Anxiety    Arthritis    DDD lumbar spine.     Cancer ([place])    breast L x 2; skin basal cell carcinoma [name]).   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   Past Surgical History:  Procedure Laterality Date   BREAST LUMPECTOMY  1996   L breast cancer   BREAST SURGERY N/A    Phreesia 12/12/2019   EYE SURGERY N/A    Phreesia 12/12/2019   MASTECTOMY  2002   Bilateral for breast cancer   VAGINAL HYSTERECTOMY  1985   DUB; uterine fibroids; ovaries intact.    Allergies  Allergen Reactions   Fruit & Vegetable Daily [Nutritional Supplements] Diarrhea   Mixed Ragweed    Onion Other (See Comments)    indigestion   Tree Extract Swelling   Sulfa Drugs Cross Reactors Rash    All over the body    Allergies as of 06/29/2022       Reactions   Fruit & Vegetable Daily [nutritional Supplements] Diarrhea   Mixed Ragweed    Onion Other (See Comments)   indigestion   Tree Extract Swelling   Sulfa Drugs  Cross Reactors Rash   All over the body        Medication List        Accurate as of June 29, 2022  4:12 PM. If you have any questions, ask your nurse or doctor.          STOP taking these medications    Cepacol Regular Strength 3 MG lozenge Generic drug: menthol-cetylpyridinium Stopped by: Virgie Dad, MD   fexofenadine 180 MG tablet Commonly known as: ALLEGRA Stopped by: Virgie Dad, MD       TAKE these medications    acetaminophen 500 MG tablet Commonly known as: TYLENOL Take 2 tablets (1,000 mg total) by mouth every 8 (eight) hours as needed.   albuterol 108 (90 Base) MCG/ACT inhaler Commonly known as: VENTOLIN  HFA Inhale 2 puffs into the lungs every 6 (six) hours as needed for wheezing or shortness of breath.   ANUSOL-HC RE Place 1 application  rectally as needed.   aspirin 81 MG tablet Take 81 mg by mouth daily.   clotrimazole 1 % vaginal cream Commonly known as: GYNE-LOTRIMIN Place 1 Applicatorful vaginally at bedtime.   docusate sodium 100 MG capsule Commonly known as: Colace Take 1 capsule (100 mg total) by mouth 2 (two) times daily.   fluconazole 100 MG tablet Commonly known as: DIFLUCAN Take 100 mg by mouth. Every 14 days for redness on vaginal area   fluticasone 50 MCG/ACT nasal spray Commonly known as: FLONASE Place 1 spray into both nostrils daily.   gabapentin 100 MG capsule Commonly known as: NEURONTIN Take 200 mg by mouth 2 (two) times daily.   glipiZIDE 5 MG 24 hr tablet Commonly known as: GLUCOTROL XL TAKE 1 TABLET EVERY DAY WITH BREAKFAST.   ketotifen 0.025 % ophthalmic solution Commonly known as: ZADITOR Place 1 drop into both eyes 2 (two) times daily as needed.   lisinopril 2.5 MG tablet Commonly known as: ZESTRIL Take 2.5 mg by mouth in the morning.   loratadine 10 MG tablet Commonly known as: CLARITIN Take 10 mg by mouth daily.   metFORMIN 500 MG tablet Commonly known as: GLUCOPHAGE Take 1 tablet (500 mg total) by mouth 2 (two) times daily with a meal.   metoprolol succinate 100 MG 24 hr tablet Commonly known as: TOPROL-XL Take 100 mg by mouth 2 (two) times daily. Take with or immediately following a meal.   montelukast 10 MG tablet Commonly known as: SINGULAIR Take 10 mg by mouth every morning.   sertraline 50 MG tablet Commonly known as: ZOLOFT Take 50 mg by mouth daily.   zinc oxide 20 % ointment Apply 1 application  topically as needed (To buttocks after every incontinent epsiode and as needed for redness).        Review of Systems  Constitutional:  Negative for activity change and appetite change.  HENT: Negative.     Respiratory:  Negative for cough and shortness of breath.   Cardiovascular:  Negative for leg swelling.  Gastrointestinal:  Negative for constipation.  Genitourinary:  Positive for dysuria, frequency and vaginal pain.  Musculoskeletal:  Positive for gait problem. Negative for arthralgias and myalgias.  Skin: Negative.   Neurological:  Negative for dizziness and weakness.  Psychiatric/Behavioral:  Positive for confusion. Negative for dysphoric mood and sleep disturbance.     Immunization History  Administered Date(s) Administered   Fluad Quad(high Dose 65+) 05/03/2022   Influenza Split 03/19/2012, 03/19/2013   Influenza, High Dose Seasonal PF 04/09/2014, 03/22/2015, 03/24/2016, 03/29/2018,  04/16/2020   Influenza,inj,Quad PF,6+ Mos 04/09/2014, 03/22/2015, 03/24/2016   Influenza-Unspecified 03/19/2012, 03/19/2013, 04/09/2017, 03/24/2018, 04/16/2020   Moderna SARS-COV2 Booster Vaccination 05/16/2022   Moderna Sars-Covid-2 Vaccination 05/19/2021   PFIZER(Purple Top)SARS-COV-2 Vaccination 08/17/2019, 09/10/2019, 04/06/2020, 11/19/2020   Pfizer Covid-19 Vaccine Bivalent Booster 39yr & up 11/18/2020   Pneumococcal Conjugate-13 11/05/2014   Pneumococcal Polysaccharide-23 07/10/2004, 12/12/2010, 05/02/2016   Pneumococcal-Unspecified 07/10/2004, 12/12/2010, 05/02/2016   Td 07/10/2009   Zoster Recombinat (Shingrix) 04/09/2017, 07/19/2017   Zoster, Live 07/10/2006   Pertinent  Health Maintenance Due  Topic Date Due   HEMOGLOBIN A1C  06/17/2022   FOOT EXAM  08/24/2022   OPHTHALMOLOGY EXAM  03/18/2023   INFLUENZA VACCINE  Completed   DEXA SCAN  Completed      09/30/2019    1:24 PM 12/15/2019    4:00 PM 12/25/2019    2:02 PM 09/18/2020   12:44 PM 01/20/2022   12:54 PM  Fall Risk  Falls in the past year? 1 0 0  1  Was there an injury with Fall? 0  0  0  Fall Risk Category Calculator 1  0  1  Fall Risk Category Low  Low  Low  Patient Fall Risk Level   Low fall risk Low fall risk High fall  risk  Patient at Risk for Falls Due to     History of fall(s);Impaired balance/gait;Impaired mobility  Fall risk Follow up  Falls evaluation completed Falls evaluation completed;Education provided  Falls evaluation completed;Education provided;Falls prevention discussed   Functional Status Survey:    Vitals:   06/29/22 1559  BP: 121/78  Pulse: 66  Resp: 18  Temp: (!) 97 F (36.1 C)  SpO2: 94%  Weight: 167 lb 12.8 oz (76.1 kg)  Height: '5\' 4"'$  (1.626 m)   Body mass index is 28.8 kg/m. Physical Exam Vitals reviewed.  Constitutional:      Appearance: Normal appearance.  HENT:     Head: Normocephalic.     Nose: Nose normal.     Mouth/Throat:     Mouth: Mucous membranes are moist.     Pharynx: Oropharynx is clear.  Eyes:     Pupils: Pupils are equal, round, and reactive to light.  Cardiovascular:     Rate and Rhythm: Normal rate and regular rhythm.     Pulses: Normal pulses.     Heart sounds: Normal heart sounds. No murmur heard. Pulmonary:     Effort: Pulmonary effort is normal.     Breath sounds: Normal breath sounds.  Abdominal:     General: Abdomen is flat. Bowel sounds are normal.     Palpations: Abdomen is soft.  Genitourinary:    Comments: Has severe Vaginal Candidiasis with no discharge Musculoskeletal:        General: No swelling.     Cervical back: Neck supple.  Skin:    General: Skin is warm.  Neurological:     Mental Status: She is alert.  Psychiatric:        Mood and Affect: Mood normal.        Thought Content: Thought content normal.     Labs reviewed: Recent Labs    09/26/21 0000 12/26/21 0000  NA 140 138  K 4.7 3.9  CL 111* 108  CO2 20 21  BUN 22* 25*  CREATININE 1.1 1.2*  CALCIUM 8.8 8.8   Recent Labs    09/26/21 0000  AST 16  ALT 10  ALKPHOS 76  ALBUMIN 3.6   Recent Labs  09/26/21 0000  WBC 9.5  NEUTROABS 5,909.00  HGB 13.6  HCT 41  PLT 306   Lab Results  Component Value Date   TSH 3.100 08/12/2019   Lab Results   Component Value Date   HGBA1C 8.3 12/16/2021   Lab Results  Component Value Date   CHOL 158 12/19/2018   HDL 36 (L) 12/19/2018   LDLCALC 68 12/19/2018   TRIG 272 (H) 12/19/2018   CHOLHDL 4.4 12/19/2018    Significant Diagnostic Results in last 30 days:  No results found.  Assessment/Plan 1. Vaginal candidiasis Will start her on Diflucan 100 mg for 14 days and then 150 mg Q weekly  2. Painful micturition Deferred Cystoscopy with Urology  Will treat Vaginal Candidiasis  3. Controlled type 2 diabetes mellitus with diabetic nephropathy, without long-term current use of insulin (HCC) Last A1C in 08/23 was 6. On Metformin and Glipizide CBGS 150-180    Family/ staff Communication:   Labs/tests ordered:

## 2022-07-05 ENCOUNTER — Non-Acute Institutional Stay (SKILLED_NURSING_FACILITY): Payer: Medicare PPO | Admitting: Orthopedic Surgery

## 2022-07-05 ENCOUNTER — Encounter: Payer: Self-pay | Admitting: Family Medicine

## 2022-07-05 ENCOUNTER — Encounter: Payer: Self-pay | Admitting: Orthopedic Surgery

## 2022-07-05 ENCOUNTER — Non-Acute Institutional Stay: Payer: Medicare PPO | Admitting: Family Medicine

## 2022-07-05 VITALS — BP 132/68 | HR 68 | Temp 98.1°F | Resp 16

## 2022-07-05 DIAGNOSIS — F03A0 Unspecified dementia, mild, without behavioral disturbance, psychotic disturbance, mood disturbance, and anxiety: Secondary | ICD-10-CM

## 2022-07-05 DIAGNOSIS — R309 Painful micturition, unspecified: Secondary | ICD-10-CM | POA: Insufficient documentation

## 2022-07-05 DIAGNOSIS — E1121 Type 2 diabetes mellitus with diabetic nephropathy: Secondary | ICD-10-CM | POA: Diagnosis not present

## 2022-07-05 DIAGNOSIS — I10 Essential (primary) hypertension: Secondary | ICD-10-CM

## 2022-07-05 DIAGNOSIS — B3731 Acute candidiasis of vulva and vagina: Secondary | ICD-10-CM

## 2022-07-05 DIAGNOSIS — G629 Polyneuropathy, unspecified: Secondary | ICD-10-CM

## 2022-07-05 DIAGNOSIS — I700 Atherosclerosis of aorta: Secondary | ICD-10-CM

## 2022-07-05 DIAGNOSIS — F339 Major depressive disorder, recurrent, unspecified: Secondary | ICD-10-CM

## 2022-07-05 DIAGNOSIS — N1831 Chronic kidney disease, stage 3a: Secondary | ICD-10-CM

## 2022-07-05 DIAGNOSIS — N3941 Urge incontinence: Secondary | ICD-10-CM

## 2022-07-05 DIAGNOSIS — R262 Difficulty in walking, not elsewhere classified: Secondary | ICD-10-CM

## 2022-07-05 MED ORDER — PHENAZOPYRIDINE HCL 100 MG PO TABS: 100.0000 mg | ORAL_TABLET | Freq: Every day | ORAL | 0 refills | Status: AC

## 2022-07-05 NOTE — Progress Notes (Signed)
[facility] Palliative Care Consult Note Telephone: [phone]  Fax: [phone]    Date of encounter: 07/05/22 10:43 AM PATIENT NAME: [name] [name] [place] [address]   [id number] (home)  DOB: 1941/06/24 MRN: [id number] PRIMARY CARE PROVIDER:    Virgie Dad, MD,  [place] [address] [phone]  REFERRING PROVIDER:   Virgie Dad, MD [address] [place],  [place] [address] [phone]  RESPONSIBLE PARTY:    Contact Information     Name Relation Home Work Trent Spouse [phone]  [phone]        I met face to face with patient in [place]. Palliative Care was asked to follow this patient by consultation request of  Virgie Dad, MD to address advance care planning and complex medical decision making. This is a follow up visit   ASSESSMENT , SYMPTOM MANAGEMENT AND PLAN / RECOMMENDATIONS:  Urinary pain/urge incontinence Recommend trial of pyridium 100 mg daily x 10 days to see if any relief of symptoms. Likely secondary to hydration, vulvovaginal atrophic changes. Pt is also on antihistamine Loratadine which can be drying. Postmenopausal and not a candidate for even topical estrogen replacement therapy with hx of breast cancer x 2.   Recommend use of long acting vaginal moisturizer like silicone based gel or use of hyaluronic acid product which will help improve moisture and restore pH (e.g. Revaree, Gynatrof)  May also help decrease incidence of yeast.   Advance Care Planning/Goals of Care: Goals include to maximize quality of life and symptom management.  Identification of a healthcare agent -spouse-Steven Landry Mellow Review of advance directive documents-DNR and MOST . Decision not to resuscitate or to de-escalate disease focused treatments due to poor prognosis. CODE STATUS: MOST as of 03/24/21: DNR/DNI/Comfort  measures No antibiotics, IV fluids or feeding tubes     Follow up Palliative Care Visit: Palliative care will continue to follow for complex medical decision making, advance care planning, and clarification of goals. Return 4-6  weeks or prn.    This visit was coded based on medical decision making (MDM).  PPS: 50%  HOSPICE ELIGIBILITY/DIAGNOSIS: TBD  Chief Complaint:  Palliative Care is continuing to follow patient for chronic medical management in setting of history of left breast cancer and DM with peripheral neuropathy.  Currently with c/o vaginal and urethral pain at end of voiding.   HISTORY OF PRESENT ILLNESS:  RYIN SCHILLO is a 81 y.o. year old female with hx of breast cancer x 2, urge incontinence, DM with peripheral neuropathy and nephropathy, venous stasis, dyslipidemia, heterozygous Factor 5 Leiden mutation without hx of DVT or PE, DJD, osteopenia, non-melanoma skin cancer, IBS , GERD and hypertension.  Pt seen seated in WC, c/o pain at end of urination with some urgency and incontinence.  Able to ambulate with assistance due to hx of falls. Spoke with Windell Moulding, NP at facility.  She states pt has had urine cultures which have predominantly shown yeast, has seen Urology and has been on trials of Fluconazole and barrier creams. Nursing states that pt is usually continent of bowel and takes herself to the bathroom.  Needs assistance with bathing and dressing.  History obtained from review of EMR, discussion with primary team, and interview with facility staff/caregiver and/or [name] [name].      Latest Ref Rng & Units 06/12/2022   12:00 AM 09/26/2021   12:00 AM 09/09/2019  4:12 PM  CBC  WBC  9.8     9.5     9.2   Hemoglobin 12.0 - 16.0 14.4     13.6     15.5   Hematocrit 36 - 46 43     41     46.1   Platelets 150 - 400 K/uL 381     306     396      This result is from an external source.       Latest Ref Rng & Units 06/12/2022   12:00 AM 12/26/2021   12:00 AM  09/26/2021   12:00 AM  CMP  BUN 4 - _0 Creatinine 0.5 - 1.1 1.2     1.2     1.1      Sodium 137 - 147 140     138     140      Potassium 3.5 - 5.1 mEq/L 4.4     3.9     4.7      Chloride 99 - 108 105     108     111      CO2 13 - _1 Calcium 8.7 - 10.7 9.4     8.8     8.8      Alkaline Phos 25 - 125 122      76      AST 13 - 35 14      16      ALT 7 - 35 U/L 12      10         This result is from an external source.      Latest Ref Rng & Units 06/12/2022   12:00 AM 09/26/2021   12:00 AM 08/12/2019    2:42 PM  Hepatic Function  Total Protein 6.0 - 8.5 g/dL   7.2   Albumin 3.5 - 5.0 4.2     3.6     4.7   AST 13 - 35 _2 ALT 7 - 35 U/L _3 Alk Phosphatase 25 - 125 122     76     94   Total Bilirubin 0.0 - 1.2 mg/dL   0.3      This result is from an external source.   06/12/22 HGB A1c 7.0%  I reviewed EMR for available labs, medications, imaging, studies and related documents.  There are no new records since last visit/Records reviewed and summarized above.   ROS General: NAD EYES: denies vision changes ENMT: denies dysphagia Cardiovascular: denies chest pain, denies DOE Pulmonary: denies cough, denies SOB Abdomen: endorses good appetite, denies constipation, endorses continence of bowel GU: denies dysuria, c/o pain particularly at end urination and voiding small amounts which has been ongoing for months, endorses urge incontinence of urine, denies d/c MSK:  denies increased weakness, no falls reported Skin: denies rashes or wounds Neurological:endorses pain at end of urination, denies insomnia Psych: Endorses positive mood Heme/lymph/immuno: denies bruises, abnormal bleeding  Physical Exam: Current and past weights: Constitutional: NAD General: WNWD ENMT: intact hearing, oral mucous membranes moist, dentition intact CV: S1S2, RRR, no LE edema Pulmonary: Bibasilar faint crackles, no increased work of  breathing, no cough, room air Abdomen: normo-active BS + 4 quadrants, soft and non tender, no ascites GU: deferred MSK: no sarcopenia, moves all extremities, wc bound with limited ambulation with assistance Skin: warm and dry, no rashes or wounds on visible skin Neuro:  no generalized weakness,  no cognitive impairment Psych: non-anxious affect, A and O x 3 Hem/lymph/immuno: no widespread bruising   Thank you for the opportunity to participate in the care of [name] [name].  The palliative care team will continue to follow. Please call our office at [phone] if we can be of additional assistance.   Marijo Conception, FNP -C  COVID-19 PATIENT SCREENING TOOL Asked and negative response unless otherwise noted:   Have you had symptoms of covid, tested positive or been in contact with someone with symptoms/positive test in the past 5-10 days?  unknown

## 2022-07-05 NOTE — Progress Notes (Signed)
Location:  [facility] Room Number: 26 Place of Service:  SNF [phone]) Provider:  Brytani Voth Sonia Baller, Rene Kocher, MD  Patient Care Team: Virgie Dad, MD as PCP - General (Internal Medicine) [name], MD (Ophthalmology) [name], MD as Consulting Physician (General Surgery) [name], MD as Consulting Physician (Internal Medicine)  Extended Emergency Contact Information Primary Emergency Contact: [name],[name] Address: [place] [address] [place] Phone: [phone] Mobile Phone: [phone] Relation: Spouse  Code Status:  DNR palliative Care  Goals of care: Advanced Directive information    07/05/2022   10:00 AM  Advanced Directives  Does Patient Have a Medical Advance Directive? Yes  Type of Paramedic of [place];Living will;Out of facility DNR (pink MOST or yellow form)  Does patient want to make changes to medical advance directive? No - Patient declined  Copy of [place] in Chart? Yes - validated most recent copy scanned in chart (See row information)  Pre-existing out of facility DNR order (yellow form or pink MOST form) Pink MOST form placed in chart (order not valid for inpatient use);Yellow form placed in chart (order not valid for inpatient use)     Chief Complaint  Patient presents with   Medical Management of Chronic Issues    Patient being seen for a routine Visit   Immunizations    Discussed the need for Tetanus vaccine    HPI:  Pt is a 81 y.o. [name] seen today for medical management of chronic diseases.    Vaginal candida- followed by urology, past cultures positive for yeast, 12/21 started on diflucan x 7 days> then weekly  Painful micturition- increased pain when urinating, 09/06 urine culture negative for growth, CT abdomen/pelvis negative for hydronephrosis, renal calculi, neoplasm T2DM- A1c 7.0 (12/04)> was 6.9 (08/28)>  was 8.3 (06/09), urine microalbumin 151 12/13/2021, no hypoglycemic events, blood sugars 120-190's, remains on metformin, glipizide, asa, and lisinopril, off Invokana due to frequent genitourinary infections Dementia- followed by palliative, MMSE 25/30 2021, diagnosed with neurodegenerative disorder per neurology, poor safety awareness Unable to walk/Frequent falls- no recent falls, ambulates with wheelchair HTN- BUN/creat 25/1.2 06/12/2022, remains on metoprolol and lisinopril Peripheral neuropathy- remains on gabapentin and amitriptyline Urge incontinence- ditropan discontinued due to dysuria, urology recommended Gemtesa or Myrbetriq if incontinence persists Depression- no mood changes, Na+ 140 06/12/2022 remains on Zoloft and amitriptyline  Recent blood pressures:  12/26- 134/73   12/19- 121/78  12/12- 118/77  Recent weights:  12/02- 167.8 lbs  11/01- 161.9 lbs  10/02- 160.3 lbs    Past Medical History:  Diagnosis Date   Allergy    [name]; allergy shots weekly.     Anxiety    Arthritis    DDD lumbar spine.     Cancer ([place])    breast L x 2; skin basal cell carcinoma [name]).   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   Past Surgical History:  Procedure Laterality Date   BREAST LUMPECTOMY  1996   L breast cancer   BREAST SURGERY N/A    Phreesia 12/12/2019   EYE SURGERY N/A    Phreesia 12/12/2019   MASTECTOMY  2002  Bilateral for breast cancer   VAGINAL HYSTERECTOMY  1985   DUB; uterine fibroids; ovaries intact.    Allergies  Allergen Reactions   Fruit & Vegetable Daily [Nutritional Supplements] Diarrhea   Mixed Ragweed    Onion Other (See Comments)    indigestion   Tree Extract Swelling   Sulfa Drugs Cross Reactors Rash    All over the body    Outpatient  Encounter Medications as of 07/05/2022  Medication Sig   acetaminophen (TYLENOL) 500 MG tablet Take 2 tablets (1,000 mg total) by mouth every 8 (eight) hours as needed.   albuterol (VENTOLIN HFA) 108 (90 Base) MCG/ACT inhaler Inhale 2 puffs into the lungs every 6 (six) hours as needed for wheezing or shortness of breath.   aspirin 81 MG tablet Take 81 mg by mouth daily.   clotrimazole (GYNE-LOTRIMIN) 1 % vaginal cream Place 1 Applicatorful vaginally at bedtime.   docusate sodium (COLACE) 100 MG capsule Take 1 capsule (100 mg total) by mouth 2 (two) times daily.   fluconazole (DIFLUCAN) 100 MG tablet Take 100 mg by mouth. Every 14 days for redness on vaginal area   fluticasone (FLONASE) 50 MCG/ACT nasal spray Place 1 spray into both nostrils daily.   gabapentin (NEURONTIN) 100 MG capsule Take 200 mg by mouth 2 (two) times daily.   glipiZIDE (GLUCOTROL XL) 5 MG 24 hr tablet TAKE 1 TABLET EVERY DAY WITH BREAKFAST.   Hydrocortisone Acetate (ANUSOL-HC RE) Place 1 application  rectally as needed.   ketotifen (ZADITOR) 0.025 % ophthalmic solution Place 1 drop into both eyes 2 (two) times daily as needed.   lisinopril (ZESTRIL) 2.5 MG tablet Take 2.5 mg by mouth in the morning.   loratadine (CLARITIN) 10 MG tablet Take 10 mg by mouth daily.   metFORMIN (GLUCOPHAGE) 500 MG tablet Take 1 tablet (500 mg total) by mouth 2 (two) times daily with a meal.   metoprolol succinate (TOPROL-XL) 100 MG 24 hr tablet Take 100 mg by mouth 2 (two) times daily. Take with or immediately following a meal.   montelukast (SINGULAIR) 10 MG tablet Take 10 mg by mouth every morning.   sertraline (ZOLOFT) 50 MG tablet Take 50 mg by mouth daily.   zinc oxide 20 % ointment Apply 1 application  topically as needed (To buttocks after every incontinent epsiode and as needed for redness).   No facility-administered encounter medications on file as of 07/05/2022.    Review of Systems  Constitutional:  Negative for activity change,  appetite change, fatigue and fever.  HENT:  Negative for congestion and trouble swallowing.   Eyes:  Negative for visual disturbance.  Respiratory:  Negative for cough, shortness of breath and wheezing.   Cardiovascular:  Negative for chest pain and leg swelling.  Gastrointestinal:  Positive for constipation. Negative for abdominal distention, abdominal pain, diarrhea, nausea and vomiting.  Genitourinary:  Positive for dysuria. Negative for frequency, hematuria, pelvic pain and vaginal bleeding.  Musculoskeletal:  Positive for gait problem. Negative for arthralgias and back pain.  Skin:  Negative for wound.  Neurological:  Positive for weakness and numbness. Negative for dizziness and headaches.  Psychiatric/Behavioral:  Positive for confusion and dysphoric mood. Negative for sleep disturbance. The patient is not nervous/anxious.     Immunization History  Administered Date(s) Administered   Fluad Quad(high Dose 65+) 05/03/2022   Influenza Split 03/19/2012, 03/19/2013   Influenza, High Dose Seasonal PF 04/09/2014, 03/22/2015, 03/24/2016, 03/29/2018, 04/16/2020   Influenza,inj,Quad PF,6+ Mos 04/09/2014, 03/22/2015, 03/24/2016   Influenza-Unspecified  03/19/2012, 03/19/2013, 04/09/2017, 03/24/2018, 04/16/2020   Moderna SARS-COV2 Booster Vaccination 05/16/2022   Moderna Sars-Covid-2 Vaccination 05/19/2021   PFIZER(Purple Top)SARS-COV-2 Vaccination 08/17/2019, 09/10/2019, 04/06/2020, 11/19/2020   Pfizer Covid-19 Vaccine Bivalent Booster 30yr & up 11/18/2020   Pneumococcal Conjugate-13 11/05/2014   Pneumococcal Polysaccharide-23 07/10/2004, 12/12/2010, 05/02/2016   Pneumococcal-Unspecified 07/10/2004, 12/12/2010, 05/02/2016   Td 07/10/2009   Zoster Recombinat (Shingrix) 04/09/2017, 07/19/2017   Zoster, Live 07/10/2006   Pertinent  Health Maintenance Due  Topic Date Due   HEMOGLOBIN A1C  06/17/2022   FOOT EXAM  08/24/2022   OPHTHALMOLOGY EXAM  03/18/2023   INFLUENZA VACCINE  Completed    DEXA SCAN  Completed      09/30/2019    1:24 PM 12/15/2019    4:00 PM 12/25/2019    2:02 PM 09/18/2020   12:44 PM 01/20/2022   12:54 PM  Fall Risk  Falls in the past year? 1 0 0  1  Was there an injury with Fall? 0  0  0  Fall Risk Category Calculator 1  0  1  Fall Risk Category Low  Low  Low  Patient Fall Risk Level   Low fall risk Low fall risk High fall risk  Patient at Risk for Falls Due to     History of fall(s);Impaired balance/gait;Impaired mobility  Fall risk Follow up  Falls evaluation completed Falls evaluation completed;Education provided  Falls evaluation completed;Education provided;Falls prevention discussed   Functional Status Survey:    Vitals:   07/05/22 0953  BP: 134/73  Pulse: 72  Resp: 19  Temp: 97.8 F (36.6 C)  TempSrc: Temporal  SpO2: 98%  Weight: 167 lb 12.8 oz (76.1 kg)  Height: '5\' 4"'$  (1.626 m)   Body mass index is 28.8 kg/m. Physical Exam Vitals reviewed.  Constitutional:      General: She is not in acute distress. HENT:     Head: Normocephalic.     Right Ear: There is no impacted cerumen.     Left Ear: There is no impacted cerumen.     Nose: Nose normal.     Mouth/Throat:     Mouth: Mucous membranes are moist.  Eyes:     General:        Right eye: No discharge.        Left eye: No discharge.  Cardiovascular:     Rate and Rhythm: Normal rate and regular rhythm.     Pulses: Normal pulses.     Heart sounds: Normal heart sounds.  Pulmonary:     Effort: Pulmonary effort is normal. No respiratory distress.     Breath sounds: Normal breath sounds. No wheezing.  Abdominal:     General: Bowel sounds are normal. There is no distension.     Palpations: Abdomen is soft.     Tenderness: There is no abdominal tenderness.  Musculoskeletal:     Cervical back: Neck supple.     Right lower leg: No edema.     Left lower leg: No edema.  Skin:    General: Skin is warm and dry.     Capillary Refill: Capillary refill takes less than 2 seconds.   Neurological:     General: No focal deficit present.     Mental Status: She is alert and oriented to person, place, and time.     Motor: Weakness present.     Gait: Gait abnormal.     Comments: wheelchair  Psychiatric:        Mood and Affect: Mood normal.  Behavior: Behavior normal.     Comments: Very pleasant, follows commands, alert x 4     Labs reviewed: Recent Labs    09/26/21 0000 12/26/21 0000  NA 140 138  K 4.7 3.9  CL 111* 108  CO2 20 21  BUN 22* 25*  CREATININE 1.1 1.2*  CALCIUM 8.8 8.8   Recent Labs    09/26/21 0000  AST 16  ALT 10  ALKPHOS 76  ALBUMIN 3.6   Recent Labs    09/26/21 0000  WBC 9.5  NEUTROABS 5,909.00  HGB 13.6  HCT 41  PLT 306   Lab Results  Component Value Date   TSH 3.100 08/12/2019   Lab Results  Component Value Date   HGBA1C 8.3 12/16/2021   Lab Results  Component Value Date   CHOL 158 12/19/2018   HDL 36 (L) 12/19/2018   LDLCALC 68 12/19/2018   TRIG 272 (H) 12/19/2018   CHOLHDL 4.4 12/19/2018    Significant Diagnostic Results in last 30 days:  No results found.  Assessment/Plan 1. Vaginal candidiasis - ongoing - on Diflucan 100 mg x 7 days- reports some improvement - cont Diflucan 150 mg weekly   2. Painful micturition - ongoing - deferred cystoscopy with urology - CT abdomen/pelvis unremarkable - start pyridium 100 mg po daily x 7 days, then daily prn  3. Controlled type 2 diabetes mellitus with diabetic nephropathy, without long-term current use of insulin (HCC) - A1c 7 (12/04) > was 6.9 - CBG's 120-190's - cont metformin, Glipizide, asa, lisinopril   4. 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 Unable to walk - no recent falls - ambulates well with wheelchair - cont SNF  6. Primary hypertension - controlled - cont lisinopril and  metoprolol  7. Peripheral polyneuropathy - stable with gabapentin  8. Urge incontinence - off ditropan - Gemtesa or Myrbetriq recommended by urology- not interested at this time  9. Recurrent depression ([place]) - no mood changes - cont zoloft  10. Stage 3a chronic kidney disease (HCC) - avoid nephrotoxic drugs like NSAIDS and dose adjust medications to be renally excreted - encourage hydration with water   11. Aortic atherosclerosis - noted on CT abdomen/pelvis  - cont asa - not on statin     Family/ staff Communication: plan discussed with patient and nurse  Labs/tests ordered:  none

## 2022-07-05 NOTE — Progress Notes (Signed)
abstraction

## 2022-07-31 ENCOUNTER — Encounter: Payer: Self-pay | Admitting: Orthopedic Surgery

## 2022-07-31 ENCOUNTER — Non-Acute Institutional Stay (SKILLED_NURSING_FACILITY): Payer: Medicare PPO | Admitting: Orthopedic Surgery

## 2022-07-31 DIAGNOSIS — I10 Essential (primary) hypertension: Secondary | ICD-10-CM | POA: Diagnosis not present

## 2022-07-31 DIAGNOSIS — E1121 Type 2 diabetes mellitus with diabetic nephropathy: Secondary | ICD-10-CM | POA: Diagnosis not present

## 2022-07-31 DIAGNOSIS — R309 Painful micturition, unspecified: Secondary | ICD-10-CM

## 2022-07-31 DIAGNOSIS — N3941 Urge incontinence: Secondary | ICD-10-CM

## 2022-07-31 DIAGNOSIS — F339 Major depressive disorder, recurrent, unspecified: Secondary | ICD-10-CM

## 2022-07-31 DIAGNOSIS — R262 Difficulty in walking, not elsewhere classified: Secondary | ICD-10-CM | POA: Diagnosis not present

## 2022-07-31 DIAGNOSIS — G629 Polyneuropathy, unspecified: Secondary | ICD-10-CM

## 2022-07-31 DIAGNOSIS — B3731 Acute candidiasis of vulva and vagina: Secondary | ICD-10-CM

## 2022-07-31 DIAGNOSIS — F03A0 Unspecified dementia, mild, without behavioral disturbance, psychotic disturbance, mood disturbance, and anxiety: Secondary | ICD-10-CM | POA: Diagnosis not present

## 2022-07-31 DIAGNOSIS — N1831 Chronic kidney disease, stage 3a: Secondary | ICD-10-CM

## 2022-07-31 NOTE — Progress Notes (Signed)
Location:  [facility] Room Number: 26 Place of Service:  SNF [phone]) Provider:  Windell Moulding, NP   Patient Care Team: Virgie Dad, MD as PCP - General (Internal Medicine) [name], MD (Ophthalmology) [name], MD as Consulting Physician (General Surgery) [name], MD as Consulting Physician (Internal Medicine)  Extended Emergency Contact Information Primary Emergency Contact: [name],[name] Address: [place] [address] [place] Phone: [phone] Mobile Phone: [phone] Relation: Spouse  Code Status:  DNR Goals of care: Advanced Directive information    07/31/2022    9:47 AM  Advanced Directives  Does Patient Have a Medical Advance Directive? Yes  Type of Paramedic of [place];Living will;Out of facility DNR (pink MOST or yellow form)  Does patient want to make changes to medical advance directive? No - Patient declined  Copy of [place] in Chart? Yes - validated most recent copy scanned in chart (See row information)  Pre-existing out of facility DNR order (yellow form or pink MOST form) Pink MOST form placed in chart (order not valid for inpatient use);Yellow form placed in chart (order not valid for inpatient use)     Chief Complaint  Patient presents with   Medical Management of Chronic Issues    Routine Visit    HPI:  Pt is a 82 y.o. female seen today for medical management of chronic conditions.   She currently resides on the skilled nursing unit at [facility]. PMH: HTN, HLD, GERD, IBS, T2DM, polyneuropathy, DJD, osteopenia, Factor 5 mutation, breast cancer s/p  left lumpectomy 1996, incontinence, and anxiety.    T2DM- A1c 7.0 (12/04)> was 6.9 (08/28)> was 8.3 (06/09), urine microalbumin 151 12/13/2021, no hypoglycemic events, blood sugars 150-190's, remains on metformin, glipizide, asa, and lisinopril, off Invokana due to frequent  genitourinary infections, last eye exam 03/2022 Dementia- followed by palliative, MMSE 25/30 2021, BIMS score 14 06/27/2022, diagnosed with neurodegenerative disorder per neurology, no behaviors, poor safety awareness Unable to walk/Frequent falls- no recent falls, ambulates with wheelchair HTN- BUN/creat 25/1.2 06/12/2022, remains on metoprolol and lisinopril CKD- see above Peripheral neuropathy- remains on gabapentin Urge incontinence- ditropan discontinued due to dysuria, urology recommended Gemtesa or Myrbetriq if incontinence persists Vaginal candida- followed by urology, past cultures positive for yeast, 12/21 started on diflucan x 7 days> then weekly  Painful micturition- increased pain when urinating, 09/06 urine culture negative for growth, CT abdomen/pelvis negative for hydronephrosis, renal calculi, neoplasm, improved with scheduled Diflucan Depression- no mood changes, reports being bored most of time, Na+ 140 06/12/2022, remains on Zoloft   Recent weights:  01/09- 163.7 lbs  12/02- 167.8 bs  11/01- 161.9 lbs  Recent blood pressures:  01/16- 129/72  01/09- 116/64  01/02- 140/82       Past Medical History:  Diagnosis Date   Allergy    [name]; allergy shots weekly.     Anxiety    Arthritis    DDD lumbar spine.     Cancer ([place])    breast L x 2; skin basal cell carcinoma [name]).   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)    Phreesia 12/12/2019   Hypertension    IBS (irritable bowel syndrome)    diarrhea predominant.   Past Surgical History:  Procedure Laterality Date  BREAST LUMPECTOMY  1996   L breast cancer   BREAST SURGERY N/A    Phreesia 12/12/2019   EYE SURGERY N/A    Phreesia 12/12/2019   MASTECTOMY  2002   Bilateral for breast cancer   VAGINAL HYSTERECTOMY  1985   DUB; uterine fibroids; ovaries intact.    Allergies  Allergen Reactions    Fruit & Vegetable Daily [Nutritional Supplements] Diarrhea   Mixed Ragweed    Onion Other (See Comments)    indigestion   Tree Extract Swelling   Sulfa Drugs Cross Reactors Rash    All over the body    Outpatient Encounter Medications as of 07/31/2022  Medication Sig   acetaminophen (TYLENOL) 500 MG tablet Take 2 tablets (1,000 mg total) by mouth every 8 (eight) hours as needed.   albuterol (VENTOLIN HFA) 108 (90 Base) MCG/ACT inhaler Inhale 2 puffs into the lungs every 6 (six) hours as needed for wheezing or shortness of breath.   aspirin 81 MG tablet Take 81 mg by mouth daily.   clotrimazole (GYNE-LOTRIMIN) 1 % vaginal cream Place 1 Applicatorful vaginally at bedtime.   docusate sodium (COLACE) 100 MG capsule Take 1 capsule (100 mg total) by mouth 2 (two) times daily.   fluconazole (DIFLUCAN) 100 MG tablet Take 100 mg by mouth. Every 14 days for redness on vaginal area   gabapentin (NEURONTIN) 100 MG capsule Take 200 mg by mouth 2 (two) times daily.   glipiZIDE (GLUCOTROL XL) 5 MG 24 hr tablet TAKE 1 TABLET EVERY DAY WITH BREAKFAST.   Hydrocortisone Acetate (ANUSOL-HC RE) Place 1 application  rectally as needed.   lisinopril (ZESTRIL) 2.5 MG tablet Take 2.5 mg by mouth in the morning.   loratadine (CLARITIN) 10 MG tablet Take 10 mg by mouth daily.   metFORMIN (GLUCOPHAGE) 500 MG tablet Take 1 tablet (500 mg total) by mouth 2 (two) times daily with a meal.   metoprolol succinate (TOPROL-XL) 100 MG 24 hr tablet Take 100 mg by mouth 2 (two) times daily. Take with or immediately following a meal.   montelukast (SINGULAIR) 10 MG tablet Take 10 mg by mouth every morning.   sertraline (ZOLOFT) 50 MG tablet Take 50 mg by mouth daily.   zinc oxide 20 % ointment Apply 1 application  topically as needed (To buttocks after every incontinent epsiode and as needed for redness).   No facility-administered encounter medications on file as of 07/31/2022.    Review of Systems  Constitutional:  Negative  for activity change, appetite change, fatigue and fever.  HENT:  Negative for congestion, hearing loss and trouble swallowing.   Eyes:  Negative for visual disturbance.  Respiratory:  Negative for cough, shortness of breath and wheezing.   Cardiovascular:  Negative for chest pain and leg swelling.  Gastrointestinal:  Negative for abdominal distention, abdominal pain, constipation, diarrhea, nausea and vomiting.  Genitourinary:  Positive for dysuria and frequency.       Incontinence  Musculoskeletal:  Positive for arthralgias and gait problem.  Skin:  Negative for wound.  Neurological:  Positive for weakness and numbness. Negative for dizziness and headaches.  Psychiatric/Behavioral:  Positive for confusion and dysphoric mood. Negative for sleep disturbance. The patient is nervous/anxious.     Immunization History  Administered Date(s) Administered   Fluad Quad(high Dose 65+) 05/03/2022   Influenza Split 03/19/2012, 03/19/2013   Influenza, High Dose Seasonal PF 04/09/2014, 03/22/2015, 03/24/2016, 03/29/2018, 04/16/2020   Influenza,inj,Quad PF,6+ Mos 04/09/2014, 03/22/2015, 03/24/2016   Influenza-Unspecified 03/19/2012, 03/19/2013, 04/09/2017, 03/24/2018,  04/16/2020   Moderna SARS-COV2 Booster Vaccination 05/16/2022   Moderna Sars-Covid-2 Vaccination 05/19/2021   PFIZER(Purple Top)SARS-COV-2 Vaccination 08/17/2019, 09/10/2019, 04/06/2020, 11/19/2020   Pfizer Covid-19 Vaccine Bivalent Booster 53yr & up 11/18/2020   Pneumococcal Conjugate-13 11/05/2014   Pneumococcal Polysaccharide-23 07/10/2004, 12/12/2010, 05/02/2016   Pneumococcal-Unspecified 07/10/2004, 12/12/2010, 05/02/2016   Td 07/10/2009   Zoster Recombinat (Shingrix) 04/09/2017, 07/19/2017   Zoster, Live 07/10/2006   Pertinent  Health Maintenance Due  Topic Date Due   FOOT EXAM  08/24/2022   HEMOGLOBIN A1C  12/12/2022   OPHTHALMOLOGY EXAM  03/18/2023   INFLUENZA VACCINE  Completed   DEXA SCAN  Completed      12/15/2019     4:00 PM 12/25/2019    2:02 PM 09/18/2020   12:44 PM 01/20/2022   12:54 PM 07/31/2022    9:44 AM  Fall Risk  Falls in the past year? 0 0  1 0  Was there an injury with Fall?  0  0 0  Fall Risk Category Calculator  0  1 0  Fall Risk Category (Retired)  Low  Low   (RETIRED) Patient Fall Risk Level  Low fall risk Low fall risk High fall risk   Patient at Risk for Falls Due to    History of fall(s);Impaired balance/gait;Impaired mobility History of fall(s);Impaired mobility;Impaired balance/gait  Fall risk Follow up Falls evaluation completed Falls evaluation completed;Education provided  Falls evaluation completed;Education provided;Falls prevention discussed Falls evaluation completed   Functional Status Survey:    Vitals:   07/31/22 0939  BP: 129/72  Pulse: 70  Resp: 20  Temp: (!) 97.2 F (36.2 C)  SpO2: 93%  Weight: 163 lb 7 oz (74.1 kg)  Height: '5\' 4"'$  (1.626 m)   Body mass index is 28.05 kg/m. Physical Exam Vitals reviewed.  Constitutional:      General: She is not in acute distress. HENT:     Head: Normocephalic.     Right Ear: There is no impacted cerumen.     Left Ear: There is no impacted cerumen.     Nose: Nose normal.     Mouth/Throat:     Mouth: Mucous membranes are moist.  Eyes:     General:        Right eye: No discharge.        Left eye: No discharge.  Cardiovascular:     Rate and Rhythm: Normal rate and regular rhythm.     Pulses: Normal pulses.     Heart sounds: Normal heart sounds.  Pulmonary:     Effort: Pulmonary effort is normal. No respiratory distress.     Breath sounds: Normal breath sounds. No wheezing.  Abdominal:     General: Bowel sounds are normal. There is no distension.     Tenderness: There is no abdominal tenderness.  Musculoskeletal:     Cervical back: Neck supple.     Right lower leg: No edema.     Left lower leg: No edema.  Skin:    General: Skin is warm and dry.     Capillary Refill: Capillary refill takes less than 2 seconds.   Neurological:     General: No focal deficit present.     Mental Status: She is alert. Mental status is at baseline.     Motor: Weakness present.     Gait: Gait abnormal.     Comments: wheelchair  Psychiatric:        Mood and Affect: Mood normal.        Behavior:  Behavior normal.     Labs reviewed: Recent Labs    09/26/21 0000 12/26/21 0000 06/12/22 0000  NA 140 138 140  K 4.7 3.9 4.4  CL 111* 108 105  CO2 20 21 24*  BUN 22* 25* 25*  CREATININE 1.1 1.2* 1.2*  CALCIUM 8.8 8.8 9.4   Recent Labs    09/26/21 0000 06/12/22 0000  AST 16 14  ALT 10 12  ALKPHOS 76 122  ALBUMIN 3.6 4.2   Recent Labs    09/26/21 0000 06/12/22 0000  WBC 9.5 9.8  NEUTROABS 5,909.00  --   HGB 13.6 14.4  HCT 41 43  PLT 306 381   Lab Results  Component Value Date   TSH 3.100 08/12/2019   Lab Results  Component Value Date   HGBA1C 7.0 06/12/2022   Lab Results  Component Value Date   CHOL 191 06/12/2022   HDL 45 06/12/2022   LDLCALC 114 06/12/2022   TRIG 196 (A) 06/12/2022   CHOLHDL 4.4 12/19/2018    Significant Diagnostic Results in last 30 days:  No results found.  Assessment/Plan 1. Controlled type 2 diabetes mellitus with diabetic nephropathy, without long-term current use of insulin (HCC) - A1c stable - off Invokana due to genitourinary infections - no hypoglycemia, sugars 150-190's - eye exam 03/2022 - urine microalbumin due 12/2022  2. Mild dementia without behavioral disturbance, psychotic disturbance, mood disturbance, or anxiety, unspecified dementia type ([place]) - no mood changes - followed by palliative - recent BIMS 14 06/27/2022 - cont skilled nursing  3. Unable to walk - no recent falls - ambulates with wheelchair - cont skilled nursing  4. Primary hypertension - controlled - cont metoprolol  5. Stage 3a chronic kidney disease (HCC) - avoid nephrotoxic drugs like NSAIDS and dose adjust medications to be renally excreted - encourage hydration with  water   6. Peripheral polyneuropathy - stable with gabapentin  7. Urge incontinence - off oxybytynin due to dysuria  8. Vaginal candidiasis - improved with weekly Diflucan  9. Painful micturition - see above - work up by urology unremarkable  10. Recurrent depression ([place]) - no mood changes - cont Zoloft   Family/ staff Communication: plan discussed with patient and nurse  Labs/tests ordered:  none

## 2022-08-23 ENCOUNTER — Non-Acute Institutional Stay (SKILLED_NURSING_FACILITY): Payer: Medicare PPO | Admitting: Internal Medicine

## 2022-08-23 ENCOUNTER — Encounter: Payer: Self-pay | Admitting: Internal Medicine

## 2022-08-23 DIAGNOSIS — F03A0 Unspecified dementia, mild, without behavioral disturbance, psychotic disturbance, mood disturbance, and anxiety: Secondary | ICD-10-CM

## 2022-08-23 DIAGNOSIS — F339 Major depressive disorder, recurrent, unspecified: Secondary | ICD-10-CM

## 2022-08-23 DIAGNOSIS — G629 Polyneuropathy, unspecified: Secondary | ICD-10-CM

## 2022-08-23 DIAGNOSIS — K5901 Slow transit constipation: Secondary | ICD-10-CM

## 2022-08-23 DIAGNOSIS — R309 Painful micturition, unspecified: Secondary | ICD-10-CM

## 2022-08-23 DIAGNOSIS — E1121 Type 2 diabetes mellitus with diabetic nephropathy: Secondary | ICD-10-CM

## 2022-08-23 DIAGNOSIS — I10 Essential (primary) hypertension: Secondary | ICD-10-CM

## 2022-08-23 NOTE — Progress Notes (Signed)
Location:  [facility] Room Number: N26A Place of Service:  Nursing [phone]) Provider:  [name], MD   [name], MD  Patient Care Team: [name], MD as PCP - General (Internal Medicine) Ernesto Rutherford, MD (Ophthalmology) Claud Kelp, MD as Consulting Physician (General Surgery) Carlus Pavlov, MD as Consulting Physician (Internal Medicine)  Extended Emergency Contact Information Primary Emergency Contact: [name],[name] Address: [address]          [place] [address] [place] Home Phone: [phone] Mobile Phone: [phone] Relation: Spouse  Code Status:  DNR Goals of care: Advanced Directive information    08/23/2022   10:51 AM  Advanced Directives  Does Patient Have a Medical Advance Directive? Yes  Type of Estate agent of [place];Living will;Out of facility DNR (pink MOST or yellow form)  Does patient want to make changes to medical advance directive? No - Patient declined  Copy of Healthcare Power of Attorney in Chart? Yes - validated most recent copy scanned in chart (See row information)  Pre-existing out of facility DNR order (yellow form or pink MOST form) Pink MOST form placed in chart (order not valid for inpatient use);Yellow form placed in chart (order not valid for inpatient use)     Chief Complaint  Patient presents with   Medical Management of Chronic Issues    Routine Visit    HPI:  Pt is a 82 y.o. female seen today for medical management of chronic diseases.    Lives in SNF Type 2 Diabetes Mellitus    Neurodegenerative Dementia Seen Neurology in 03/21 MMSE 25/30 MRI Moderate atrophy. Mild progression of white matter changes most likely due to chronic microvascular ischemia Urinary Incontinence HTN Insmonia, And Neuropathy Chronic Candidiasis   Patient just had Covid infection treated with Paxlovid She is doing well now Have some cough but much better Does  feel weak But her speech has improved and Overall continues to do well  No Behavior issues Transfers with mild assist Also Walking some therapy now No Falls Wt Readings from Last 3 Encounters:  08/23/22 163 lb 11.2 oz (74.3 kg)  07/31/22 163 lb 7 oz (74.1 kg)  07/05/22 167 lb 12.8 oz (76.1 kg)   Past Medical History:  Diagnosis Date   Allergy    [name]; allergy shots weekly.     Anxiety    Arthritis    DDD lumbar spine.     Cancer (HCC)    breast L x 2; skin basal cell carcinoma [name]).   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   Past Surgical History:  Procedure Laterality Date   BREAST LUMPECTOMY  1996   L breast cancer   BREAST SURGERY N/A    Phreesia 12/12/2019   EYE SURGERY N/A    Phreesia 12/12/2019   MASTECTOMY  2002   Bilateral for breast cancer   VAGINAL HYSTERECTOMY  1985   DUB; uterine fibroids; ovaries intact.    Allergies  Allergen Reactions   Fruit & Vegetable Daily [Nutritional Supplements] Diarrhea   Mixed Ragweed    Onion Other (See Comments)    indigestion   Tree Extract Swelling   Sulfa Drugs Cross Reactors Rash    All over the body  Outpatient Encounter Medications as of 08/23/2022  Medication Sig   acetaminophen (TYLENOL) 500 MG tablet Take 2 tablets (1,000 mg total) by mouth every 8 (eight) hours as needed.   albuterol (VENTOLIN HFA) 108 (90 Base) MCG/ACT inhaler Inhale 2 puffs into the lungs every 6 (six) hours as needed for wheezing or shortness of breath.   aspirin 81 MG chewable tablet Chew by mouth daily.   clotrimazole (GYNE-LOTRIMIN) 1 % vaginal cream Place 1 Applicatorful vaginally at bedtime.   docusate sodium (COLACE) 100 MG capsule Take 1 capsule (100 mg total) by mouth 2 (two) times daily.   fluconazole (DIFLUCAN) 100 MG  tablet Take 100 mg by mouth. Every 14 days for redness on vaginal area   fluticasone (FLONASE) 50 MCG/ACT nasal spray Place into both nostrils daily.   gabapentin (NEURONTIN) 100 MG capsule Take 200 mg by mouth 2 (two) times daily.   glipiZIDE (GLUCOTROL XL) 5 MG 24 hr tablet TAKE 1 TABLET EVERY DAY WITH BREAKFAST.   Hydrocortisone Acetate (ANUSOL-HC RE) Place 1 application  rectally as needed.   lisinopril (ZESTRIL) 2.5 MG tablet Take 2.5 mg by mouth in the morning.   loratadine (CLARITIN) 10 MG tablet Take 10 mg by mouth daily.   metFORMIN (GLUCOPHAGE) 500 MG tablet Take 1 tablet (500 mg total) by mouth 2 (two) times daily with a meal.   metoprolol succinate (TOPROL-XL) 100 MG 24 hr tablet Take 100 mg by mouth 2 (two) times daily. Take with or immediately following a meal.   montelukast (SINGULAIR) 10 MG tablet Take 10 mg by mouth every morning.   sertraline (ZOLOFT) 50 MG tablet Take 50 mg by mouth daily.   zinc oxide 20 % ointment Apply 1 application  topically as needed (To buttocks after every incontinent epsiode and as needed for redness).   [DISCONTINUED] aspirin 81 MG tablet Take 81 mg by mouth daily.   No facility-administered encounter medications on file as of 08/23/2022.    Review of Systems  Constitutional:  Negative for activity change and appetite change.  HENT: Negative.    Respiratory:  Positive for cough. Negative for shortness of breath.   Cardiovascular:  Negative for leg swelling.  Gastrointestinal:  Negative for constipation.  Genitourinary: Negative.   Musculoskeletal:  Positive for gait problem. Negative for arthralgias and myalgias.  Skin: Negative.   Neurological:  Negative for dizziness and weakness.  Psychiatric/Behavioral:  Positive for confusion. Negative for dysphoric mood and sleep disturbance.     Immunization History  Administered Date(s) Administered   Fluad Quad(high Dose 65+) 05/03/2022   Influenza Split 03/19/2012, 03/19/2013   Influenza, High  Dose Seasonal PF 04/09/2014, 03/22/2015, 03/24/2016, 03/29/2018, 04/16/2020   Influenza,inj,Quad PF,6+ Mos 04/09/2014, 03/22/2015, 03/24/2016   Influenza-Unspecified 03/19/2012, 03/19/2013, 04/09/2017, 03/24/2018, 04/16/2020   Moderna SARS-COV2 Booster Vaccination 05/16/2022   Moderna Sars-Covid-2 Vaccination 05/19/2021   PFIZER(Purple Top)SARS-COV-2 Vaccination 08/17/2019, 09/10/2019, 04/06/2020, 11/19/2020   Pfizer Covid-19 Vaccine Bivalent Booster 81yrs & up 11/18/2020   Pneumococcal Conjugate-13 11/05/2014   Pneumococcal Polysaccharide-23 07/10/2004, 12/12/2010, 05/02/2016   Pneumococcal-Unspecified 07/10/2004, 12/12/2010, 05/02/2016   Td 07/10/2009   Tdap 06/06/2022   Zoster Recombinat (Shingrix) 04/09/2017, 07/19/2017   Zoster, Live 07/10/2006   Pertinent  Health Maintenance Due  Topic Date Due   FOOT EXAM  08/24/2022   HEMOGLOBIN A1C  12/12/2022   OPHTHALMOLOGY EXAM  03/18/2023   INFLUENZA VACCINE  Completed   DEXA SCAN  Completed      12/15/2019    4:00 PM 12/25/2019  2:02 PM 09/18/2020   12:44 PM 01/20/2022   12:54 PM 07/31/2022    9:44 AM  Fall Risk  Falls in the past year? 0 0  1 0  Was there an injury with Fall?  0  0 0  Fall Risk Category Calculator  0  1 0  Fall Risk Category (Retired)  Low  Low   (RETIRED) Patient Fall Risk Level  Low fall risk Low fall risk High fall risk   Patient at Risk for Falls Due to    History of fall(s);Impaired balance/gait;Impaired mobility History of fall(s);Impaired mobility;Impaired balance/gait  Fall risk Follow up Falls evaluation completed Falls evaluation completed;Education provided  Falls evaluation completed;Education provided;Falls prevention discussed Falls evaluation completed   Functional Status Survey:    Vitals:   08/23/22 1014  BP: 133/68  Pulse: 69  Resp: 16  Temp: 97.8 F (36.6 C)  TempSrc: Temporal  SpO2: 98%  Weight: 163 lb 11.2 oz (74.3 kg)  Height: 5\' 4"  (1.626 m)   Body mass index is 28.1  kg/m. Physical Exam Vitals reviewed.  Constitutional:      Appearance: Normal appearance.  HENT:     Head: Normocephalic.     Nose: Nose normal.     Mouth/Throat:     Mouth: Mucous membranes are moist.     Pharynx: Oropharynx is clear.  Eyes:     Pupils: Pupils are equal, round, and reactive to light.  Cardiovascular:     Rate and Rhythm: Normal rate and regular rhythm.     Pulses: Normal pulses.     Heart sounds: Normal heart sounds. No murmur heard. Pulmonary:     Effort: Pulmonary effort is normal.     Breath sounds: Normal breath sounds.  Abdominal:     General: Abdomen is flat. Bowel sounds are normal.     Palpations: Abdomen is soft.  Musculoskeletal:        General: No swelling.     Cervical back: Neck supple.  Skin:    General: Skin is warm.  Neurological:     General: No focal deficit present.     Mental Status: She is alert.     Comments: Some aphasia But overall is doing well  Psychiatric:        Mood and Affect: Mood normal.        Thought Content: Thought content normal.     Labs reviewed: Recent Labs    09/26/21 0000 12/26/21 0000 06/12/22 0000  NA 140 138 140  K 4.7 3.9 4.4  CL 111* 108 105  CO2 20 21 24*  BUN 22* 25* 25*  CREATININE 1.1 1.2* 1.2*  CALCIUM 8.8 8.8 9.4   Recent Labs    09/26/21 0000 06/12/22 0000  AST 16 14  ALT 10 12  ALKPHOS 76 122  ALBUMIN 3.6 4.2   Recent Labs    09/26/21 0000 06/12/22 0000  WBC 9.5 9.8  NEUTROABS 5,909.00  --   HGB 13.6 14.4  HCT 41 43  PLT 306 381   Lab Results  Component Value Date   TSH 3.100 08/12/2019   Lab Results  Component Value Date   HGBA1C 7.0 06/12/2022   Lab Results  Component Value Date   CHOL 191 06/12/2022   HDL 45 06/12/2022   LDLCALC 114 06/12/2022   TRIG 196 (A) 06/12/2022   CHOLHDL 4.4 12/19/2018    Significant Diagnostic Results in last 30 days:  No results found.  Assessment/Plan 1. Controlled type 2  diabetes mellitus with diabetic nephropathy,  without long-term current use of insulin (HCC) Doing well on Glipizide and Metformin Lab Results  Component Value Date   HGBA1C 7.0 06/12/2022    2. 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 Primary hypertension On Lisinopril and Metoprolol  4. Peripheral polyneuropathy Gabapentin  5. Painful micturition Symptoms improved on Diflucan  6. Recurrent depression (HCC) Zoloft  7. Slow transit constipation Will add stool softener 8 CKD stage a Creat stable 9 HLD Her LDL is  115 Has been on Pravastatin before Will d/w her husband   Family/ staff Communication:   Labs/tests ordered:

## 2022-09-05 ENCOUNTER — Non-Acute Institutional Stay (SKILLED_NURSING_FACILITY): Payer: Medicare PPO | Admitting: Adult Health

## 2022-09-05 ENCOUNTER — Encounter: Payer: Self-pay | Admitting: Adult Health

## 2022-09-05 DIAGNOSIS — K08109 Complete loss of teeth, unspecified cause, unspecified class: Secondary | ICD-10-CM

## 2022-09-05 DIAGNOSIS — J302 Other seasonal allergic rhinitis: Secondary | ICD-10-CM | POA: Diagnosis not present

## 2022-09-05 DIAGNOSIS — R682 Dry mouth, unspecified: Secondary | ICD-10-CM | POA: Diagnosis not present

## 2022-09-05 NOTE — Progress Notes (Signed)
Location:  [facility] Room Number: N26A Place of Service:  SNF ([phone]) Provider:  Durenda Age, DNP, FNP-BC  Patient Care Team: Virgie Dad, MD as PCP - General (Internal Medicine) [name], MD (Ophthalmology) [name], MD as Consulting Physician (General Surgery) [name], MD as Consulting Physician (Internal Medicine)  Extended Emergency Contact Information Primary Emergency Contact: [name],[name] Address: [place] [address] [place] Phone: [phone] Mobile Phone: [phone] Relation: Spouse  Code Status:  DNR  Goals of care: Advanced Directive information    08/23/2022   10:51 AM  Advanced Directives  Does Patient Have a Medical Advance Directive? Yes  Type of Paramedic of [place];Living will;Out of facility DNR (pink MOST or yellow form)  Does patient want to make changes to medical advance directive? No - Patient declined  Copy of [place] in Chart? Yes - validated most recent copy scanned in chart (See row information)  Pre-existing out of facility DNR order (yellow form or pink MOST form) Pink MOST form placed in chart (order not valid for inpatient use);Yellow form placed in chart (order not valid for inpatient use)     Chief Complaint  Patient presents with   Acute Visit    Dry mouth    HPI:  Pt is a 82 y.o. female seen today for an acute visit regarding dry mouth. She Sjogren's Disease. She was seen in her room toda. She said that what bothers her is having missing teeth in her front top. She takes Claritin and Flonase for seasonal allergic rhinitis. She said that she is agreeing to consult a dentist for her missing teeth.   Past Medical History:  Diagnosis Date   Allergy    [name]; allergy shots weekly.     Anxiety    Arthritis    DDD lumbar spine.     Cancer ([place])    breast L x 2; skin basal cell  carcinoma [name]).   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   Past Surgical History:  Procedure Laterality Date   BREAST LUMPECTOMY  1996   L breast cancer   BREAST SURGERY N/A    Phreesia 12/12/2019   EYE SURGERY N/A    Phreesia 12/12/2019   MASTECTOMY  2002   Bilateral for breast cancer   VAGINAL HYSTERECTOMY  1985   DUB; uterine fibroids; ovaries intact.    Allergies  Allergen Reactions   Fruit & Vegetable Daily [Nutritional Supplements] Diarrhea   Mixed Ragweed    Onion Other (See Comments)    indigestion   Tree Extract Swelling   Sulfa Drugs Cross Reactors Rash    All over the body    Outpatient Encounter Medications as of 09/05/2022  Medication Sig   acetaminophen (TYLENOL) 500 MG tablet Take 2 tablets (1,000 mg total) by mouth every 8 (eight) hours as needed.   albuterol (VENTOLIN HFA) 108 (90 Base) MCG/ACT inhaler Inhale 2 puffs into the lungs every 6 (six) hours as needed for wheezing or shortness of breath.   aspirin 81 MG chewable tablet Chew by mouth daily.   clotrimazole (GYNE-LOTRIMIN) 1 % vaginal cream Place 1 Applicatorful vaginally at  bedtime.   docusate sodium (COLACE) 100 MG capsule Take 1 capsule (100 mg total) by mouth 2 (two) times daily.   fluconazole (DIFLUCAN) 100 MG tablet Take 150 mg by mouth once a week.   fluticasone (FLONASE) 50 MCG/ACT nasal spray Place into both nostrils daily.   gabapentin (NEURONTIN) 100 MG capsule Take 200 mg by mouth 2 (two) times daily.   glipiZIDE (GLUCOTROL XL) 5 MG 24 hr tablet TAKE 1 TABLET EVERY DAY WITH BREAKFAST.   Hydrocortisone Acetate (ANUSOL-HC RE) Place 1 application  rectally as needed.   lisinopril (ZESTRIL) 2.5 MG tablet Take 2.5 mg by mouth in the morning.   loratadine (CLARITIN) 10 MG tablet Take 10 mg by  mouth daily.   metFORMIN (GLUCOPHAGE) 500 MG tablet Take 1 tablet (500 mg total) by mouth 2 (two) times daily with a meal.   metoprolol succinate (TOPROL-XL) 100 MG 24 hr tablet Take 100 mg by mouth 2 (two) times daily. Take with or immediately following a meal.   montelukast (SINGULAIR) 10 MG tablet Take 10 mg by mouth every morning.   sertraline (ZOLOFT) 50 MG tablet Take 50 mg by mouth daily.   zinc oxide 20 % ointment Apply 1 application  topically as needed (To buttocks after every incontinent epsiode and as needed for redness).   No facility-administered encounter medications on file as of 09/05/2022.    Review of Systems  Constitutional:  Negative for appetite change, chills, fatigue and fever.  HENT:  Negative for congestion, hearing loss, rhinorrhea and sore throat.   Eyes: Negative.   Respiratory:  Negative for cough, shortness of breath and wheezing.   Cardiovascular:  Negative for chest pain, palpitations and leg swelling.  Gastrointestinal:  Negative for abdominal pain, constipation, diarrhea, nausea and vomiting.  Genitourinary:  Negative for dysuria.  Musculoskeletal:  Negative for arthralgias, back pain and myalgias.  Skin:  Negative for color change, rash and wound.  Neurological:  Negative for dizziness, weakness and headaches.  Psychiatric/Behavioral:  Negative for behavioral problems. The patient is not nervous/anxious.        Immunization History  Administered Date(s) Administered   Fluad Quad(high Dose 65+) 05/03/2022   Influenza Split 03/19/2012, 03/19/2013   Influenza, High Dose Seasonal PF 04/09/2014, 03/22/2015, 03/24/2016, 03/29/2018, 04/16/2020   Influenza,inj,Quad PF,6+ Mos 04/09/2014, 03/22/2015, 03/24/2016   Influenza-Unspecified 03/19/2012, 03/19/2013, 04/09/2017, 03/24/2018, 04/16/2020   Moderna SARS-COV2 Booster Vaccination 05/16/2022   Moderna Sars-Covid-2 Vaccination 05/19/2021   PFIZER(Purple Top)SARS-COV-2 Vaccination 08/17/2019, 09/10/2019,  04/06/2020, 11/19/2020   Pfizer Covid-19 Vaccine Bivalent Booster 42yr & up 11/18/2020   Pneumococcal Conjugate-13 11/05/2014   Pneumococcal Polysaccharide-23 07/10/2004, 12/12/2010, 05/02/2016   Pneumococcal-Unspecified 07/10/2004, 12/12/2010, 05/02/2016   Td 07/10/2009   Tdap 06/06/2022   Zoster Recombinat (Shingrix) 04/09/2017, 07/19/2017   Zoster, Live 07/10/2006   Pertinent  Health Maintenance Due  Topic Date Due   FOOT EXAM  08/24/2022   HEMOGLOBIN A1C  12/12/2022   OPHTHALMOLOGY EXAM  03/18/2023   INFLUENZA VACCINE  Completed   DEXA SCAN  Completed      12/15/2019    4:00 PM 12/25/2019    2:02 PM 09/18/2020   12:44 PM 01/20/2022   12:54 PM 07/31/2022    9:44 AM  Fall Risk  Falls in the past year? 0 0  1 0  Was there an injury with Fall?  0  0 0  Fall Risk Category Calculator  0  1 0  Fall Risk Category (Retired)  Low  Low   (RETIRED) Patient  Fall Risk Level  Low fall risk Low fall risk High fall risk   Patient at Risk for Falls Due to    History of fall(s);Impaired balance/gait;Impaired mobility History of fall(s);Impaired mobility;Impaired balance/gait  Fall risk Follow up Falls evaluation completed Falls evaluation completed;Education provided  Falls evaluation completed;Education provided;Falls prevention discussed Falls evaluation completed     Vitals:   09/05/22 1640  BP: 110/69  Pulse: 71  Resp: 20  Temp: 98.3 F (36.8 C)  SpO2: 98%  Weight: 163 lb 11.2 oz (74.3 kg)  Height: '5\' 4"'$  (1.626 m)   Body mass index is 28.1 kg/m.  Physical Exam Constitutional:      Appearance: Normal appearance.  HENT:     Head: Normocephalic and atraumatic.     Nose: Nose normal.     Mouth/Throat:     Mouth: Mucous membranes are moist.  Eyes:     Conjunctiva/sclera: Conjunctivae normal.  Cardiovascular:     Rate and Rhythm: Normal rate and regular rhythm.  Pulmonary:     Effort: Pulmonary effort is normal.     Breath sounds: Normal breath sounds.  Abdominal:      General: Bowel sounds are normal.     Palpations: Abdomen is soft.  Musculoskeletal:        General: Normal range of motion.     Cervical back: Normal range of motion.  Skin:    General: Skin is warm and dry.  Neurological:     General: No focal deficit present.     Mental Status: She is alert and oriented to person, place, and time.  Psychiatric:        Mood and Affect: Mood normal.        Behavior: Behavior normal.     Labs reviewed: Recent Labs    09/26/21 0000 12/26/21 0000 06/12/22 0000  NA 140 138 140  K 4.7 3.9 4.4  CL 111* 108 105  CO2 20 21 24*  BUN 22* 25* 25*  CREATININE 1.1 1.2* 1.2*  CALCIUM 8.8 8.8 9.4   Recent Labs    09/26/21 0000 06/12/22 0000  AST 16 14  ALT 10 12  ALKPHOS 76 122  ALBUMIN 3.6 4.2   Recent Labs    09/26/21 0000 06/12/22 0000  WBC 9.5 9.8  NEUTROABS 5,909.00  --   HGB 13.6 14.4  HCT 41 43  PLT 306 381   Lab Results  Component Value Date   TSH 3.100 08/12/2019   Lab Results  Component Value Date   HGBA1C 7.0 06/12/2022   Lab Results  Component Value Date   CHOL 191 06/12/2022   HDL 45 06/12/2022   LDLCALC 114 06/12/2022   TRIG 196 (A) 06/12/2022   CHOLHDL 4.4 12/19/2018    Significant Diagnostic Results in last 30 days:  No results found.  Assessment/Plan  1. Mouth dryness -  has Sjogren's disease -   avoid coffee nor alcohol -  oral care daily -  frequent sips of water throughout the day  2. Missing teeth, acquired -  dental consult for possible dentures  3. Seasonal allergic rhinitis, unspecified trigger -  discontinue Claritin which contributes to dry mouth -  continue Flonase    Family/ staff Communication: Discussed plan of care with resident and charge nurse.  Labs/tests ordered:  None    Durenda Age, DNP, MSN, FNP-BC [facility] [phone] (Monday-Friday 8:00 a.m. - 5:00 p.m.) [phone] (after hours)

## 2022-09-13 ENCOUNTER — Non-Acute Institutional Stay (SKILLED_NURSING_FACILITY): Payer: Medicare PPO | Admitting: Orthopedic Surgery

## 2022-09-13 ENCOUNTER — Encounter: Payer: Self-pay | Admitting: Orthopedic Surgery

## 2022-09-13 DIAGNOSIS — G629 Polyneuropathy, unspecified: Secondary | ICD-10-CM

## 2022-09-13 DIAGNOSIS — R682 Dry mouth, unspecified: Secondary | ICD-10-CM

## 2022-09-13 DIAGNOSIS — E1121 Type 2 diabetes mellitus with diabetic nephropathy: Secondary | ICD-10-CM | POA: Diagnosis not present

## 2022-09-13 DIAGNOSIS — R262 Difficulty in walking, not elsewhere classified: Secondary | ICD-10-CM

## 2022-09-13 DIAGNOSIS — E1169 Type 2 diabetes mellitus with other specified complication: Secondary | ICD-10-CM

## 2022-09-13 DIAGNOSIS — I10 Essential (primary) hypertension: Secondary | ICD-10-CM

## 2022-09-13 DIAGNOSIS — N1831 Chronic kidney disease, stage 3a: Secondary | ICD-10-CM

## 2022-09-13 DIAGNOSIS — R309 Painful micturition, unspecified: Secondary | ICD-10-CM

## 2022-09-13 DIAGNOSIS — F03A0 Unspecified dementia, mild, without behavioral disturbance, psychotic disturbance, mood disturbance, and anxiety: Secondary | ICD-10-CM

## 2022-09-13 DIAGNOSIS — N3941 Urge incontinence: Secondary | ICD-10-CM

## 2022-09-13 DIAGNOSIS — F339 Major depressive disorder, recurrent, unspecified: Secondary | ICD-10-CM

## 2022-09-13 DIAGNOSIS — E785 Hyperlipidemia, unspecified: Secondary | ICD-10-CM

## 2022-09-13 DIAGNOSIS — K089 Disorder of teeth and supporting structures, unspecified: Secondary | ICD-10-CM | POA: Diagnosis not present

## 2022-09-13 NOTE — Progress Notes (Addendum)
Location:   [facility] Room Number: [place] of Service:  SNF [phone]) Provider:  Windell Moulding, NP  PCP: Virgie Dad, MD  Patient Care Team: Virgie Dad, MD as PCP - General (Internal Medicine) [name], MD (Ophthalmology) [name], MD as Consulting Physician (General Surgery) [name], MD as Consulting Physician (Internal Medicine)  Extended Emergency Contact Information Primary Emergency Contact: [name],[name] Address: [place] [address] [place] Phone: [phone] Mobile Phone: [phone] Relation: Spouse  Code Status:  DNR Goals of care: Advanced Directive information    09/13/2022   11:08 AM  Advanced Directives  Does Patient Have a Medical Advance Directive? Yes  Type of Paramedic of [place];Living will;Out of facility DNR (pink MOST or yellow form)  Does patient want to make changes to medical advance directive? No - Patient declined  Copy of [place] in Chart? Yes - validated most recent copy scanned in chart (See row information)     Chief Complaint  Patient presents with   Medical Management of Chronic Issues    Routine Visit.    Health Maintenance    Discuss the need for Foot exam.   Immunizations    Discuss the need for Covid Booster.    HPI:  Pt is a 82 y.o. female seen today for medical management of chronic diseases.    She currently resides on the skilled nursing unit at [facility]. PMH: HTN, HLD, GERD, IBS, T2DM, polyneuropathy, DJD, osteopenia, Factor 5 mutation, breast cancer s/p  left lumpectomy 1996, incontinence, and anxiety.    Dry mouth/ poor dentition-  02/27 started on Biotene spray, she reports some improvement> wants mouth wash, reports past h/o Sjogren, not on medication, denies pain in mouth today T2DM- A1c 7.0 (12/04)> was 6.9 (08/28)> was 8.3 (06/09), urine microalbumin 151 12/13/2021, no  hypoglycemic events, blood sugars 130-200's, remains on metformin, glipizide, asa, and lisinopril, off Invokana due to frequent genitourinary infections, last eye exam 03/2022 Dementia-  MMSE 25/30 2021, BIMS score 14 06/27/2022, diagnosed with neurodegenerative disorder per neurology, no behaviors, not on medication Unstable gait/frequent falls- no recent falls, ambulates with wheelchair HTN- BUN/creat 25/1.2 06/12/2022, remains on metoprolol and lisinopril CKD- see above Peripheral neuropathy- remains on gabapentin Urge incontinence- ditropan discontinued due to dysuria, urology recommended Gemtesa or Myrbetriq if incontinence persists Painful micturition- increased pain when urinating, 09/06 urine culture negative for growth, CT abdomen/pelvis negative for hydronephrosis, renal calculi, neoplasm, improved with scheduled Diflucan Depression- no mood changes, Na+ 140 06/12/2022, bored> wants to go on a car ride, remains on Zoloft   Recent blood pressures:  03/05- 124/77  02/27- 119/70  02/20- 110/69  Recent weights:  03/01- 164.3 lbs  02/01- 166.8 lbs  01/09- 163.7 lbs   Past Medical History:  Diagnosis Date   Allergy    [name]; allergy shots weekly.     Anxiety    Arthritis    DDD lumbar spine.     Cancer ([place])    breast L x 2; skin basal cell carcinoma [name]).   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   Past Surgical History:  Procedure Laterality Date   BREAST LUMPECTOMY  1996   L breast cancer   BREAST SURGERY N/A    Phreesia 12/12/2019   EYE SURGERY N/A    Phreesia 12/12/2019   MASTECTOMY  2002   Bilateral for breast cancer   VAGINAL HYSTERECTOMY  1985   DUB; uterine fibroids; ovaries intact.    Allergies  Allergen Reactions   Fruit & Vegetable Daily  [Nutritional Supplements] Diarrhea   Mixed Ragweed    Onion Other (See Comments)    indigestion   Tree Extract Swelling   Sulfa Drugs Cross Reactors Rash    All over the body    Allergies as of 09/13/2022       Reactions   Fruit & Vegetable Daily [nutritional Supplements] Diarrhea   Mixed Ragweed    Onion Other (See Comments)   indigestion   Tree Extract Swelling   Sulfa Drugs Cross Reactors Rash   All over the body        Medication List        Accurate as of September 13, 2022 11:08 AM. If you have any questions, ask your nurse or doctor.          acetaminophen 500 MG tablet Commonly known as: TYLENOL Take 2 tablets (1,000 mg total) by mouth every 8 (eight) hours as needed.   albuterol 108 (90 Base) MCG/ACT inhaler Commonly known as: VENTOLIN HFA Inhale 2 puffs into the lungs every 6 (six) hours as needed for wheezing or shortness of breath.   ANUSOL-HC RE Place 1 application  rectally as needed.   aspirin 81 MG chewable tablet Chew by mouth daily.   clotrimazole 1 % vaginal cream Commonly known as: GYNE-LOTRIMIN Place 1 Applicatorful vaginally at bedtime.   docusate sodium 100 MG capsule Commonly known as: Colace Take 1 capsule (100 mg total) by mouth 2 (two) times daily.   fluconazole 100 MG tablet Commonly known as: DIFLUCAN Take 150 mg by mouth once a week.   fluticasone 50 MCG/ACT nasal spray Commonly known as: FLONASE Place into both nostrils daily.   gabapentin 100 MG capsule Commonly known as: NEURONTIN Take 200 mg by mouth 2 (two) times daily.   glipiZIDE 5 MG 24 hr tablet Commonly known as: GLUCOTROL XL TAKE 1 TABLET EVERY DAY WITH BREAKFAST.   ketotifen 0.025 % ophthalmic solution Commonly known as: ZADITOR Place 1 drop into both eyes every 12 (twelve) hours as needed.   lisinopril 2.5 MG tablet Commonly known as: ZESTRIL Take 2.5 mg by mouth in the morning.   metFORMIN 500 MG tablet Commonly known as: GLUCOPHAGE Take 1 tablet  (500 mg total) by mouth 2 (two) times daily with a meal.   metoprolol succinate 100 MG 24 hr tablet Commonly known as: TOPROL-XL Take 100 mg by mouth 2 (two) times daily. Take with or immediately following a meal.   montelukast 10 MG tablet Commonly known as: SINGULAIR Take 10 mg by mouth every morning.   sertraline 50 MG tablet Commonly known as: ZOLOFT Take 50 mg by mouth daily.   zinc oxide 20 % ointment Apply 1 application  topically as needed (To buttocks after every incontinent epsiode and as needed for redness).        Review of Systems  Constitutional:  Negative for activity change and appetite change.  HENT:  Negative for congestion and trouble swallowing.   Eyes:  Negative for visual disturbance.  Respiratory:  Negative for cough, shortness of breath and wheezing.   Cardiovascular:  Negative for chest pain and leg swelling.  Gastrointestinal:  Negative for abdominal distention and abdominal pain.  Genitourinary:  Negative for dysuria, frequency and hematuria.       Incontinence  Musculoskeletal:  Positive for arthralgias and gait problem.  Skin:  Negative for wound.  Neurological:  Positive for weakness. Negative for dizziness and headaches.  Psychiatric/Behavioral:  Positive for confusion and dysphoric mood. Negative for sleep disturbance. The patient is nervous/anxious.     Immunization History  Administered Date(s) Administered   Fluad Quad(high Dose 65+) 05/03/2022   Influenza Split 03/19/2012, 03/19/2013   Influenza, High Dose Seasonal PF 04/09/2014, 03/22/2015, 03/24/2016, 03/29/2018, 04/16/2020   Influenza,inj,Quad PF,6+ Mos 04/09/2014, 03/22/2015, 03/24/2016   Influenza-Unspecified 03/19/2012, 03/19/2013, 04/09/2017, 03/24/2018, 04/16/2020   Moderna SARS-COV2 Booster Vaccination 05/16/2022   Moderna Sars-Covid-2 Vaccination 05/19/2021   PFIZER(Purple Top)SARS-COV-2 Vaccination 08/17/2019, 09/10/2019, 04/06/2020, 11/19/2020   Pfizer Covid-19 Vaccine  Bivalent Booster 49yr & up 11/18/2020   Pneumococcal Conjugate-13 11/05/2014   Pneumococcal Polysaccharide-23 07/10/2004, 12/12/2010, 05/02/2016   Pneumococcal-Unspecified 07/10/2004, 12/12/2010, 05/02/2016   Td 07/10/2009   Tdap 06/06/2022   Zoster Recombinat (Shingrix) 04/09/2017, 07/19/2017   Zoster, Live 07/10/2006   Pertinent  Health Maintenance Due  Topic Date Due   FOOT EXAM  08/24/2022   HEMOGLOBIN A1C  12/12/2022   OPHTHALMOLOGY EXAM  03/18/2023   INFLUENZA VACCINE  Completed   DEXA SCAN  Completed      12/15/2019    4:00 PM 12/25/2019    2:02 PM 09/18/2020   12:44 PM 01/20/2022   12:54 PM 07/31/2022    9:44 AM  Fall Risk  Falls in the past year? 0 0  1 0  Was there an injury with Fall?  0  0 0  Fall Risk Category Calculator  0  1 0  Fall Risk Category (Retired)  Low  Low   (RETIRED) Patient Fall Risk Level  Low fall risk Low fall risk High fall risk   Patient at Risk for Falls Due to    History of fall(s);Impaired balance/gait;Impaired mobility History of fall(s);Impaired mobility;Impaired balance/gait  Fall risk Follow up Falls evaluation completed Falls evaluation completed;Education provided  Falls evaluation completed;Education provided;Falls prevention discussed Falls evaluation completed   Functional Status Survey:    Vitals:   09/13/22 1103  BP: 124/77  Pulse: 69  Resp: 19  Temp: 97.6 F (36.4 C)  SpO2: 96%  Weight: 164 lb 4.8 oz (74.5 kg)  Height: '5\' 4"'$  (1.626 m)   Body mass index is 28.2 kg/m. Physical Exam Vitals reviewed.  Constitutional:      General: She is not in acute distress. HENT:     Head: Normocephalic.     Right Ear: There is no impacted cerumen.     Left Ear: There is no impacted cerumen.     Nose: Nose normal.     Mouth/Throat:     Mouth: Mucous membranes are moist. No oral lesions.     Dentition: Abnormal dentition.     Comments: Missing teeth Eyes:     General:        Right eye: No discharge.        Left eye: No discharge.   Cardiovascular:     Rate and Rhythm: Normal rate and regular rhythm.     Pulses:          Dorsalis pedis pulses are 1+ on the right side and 1+ on the left side.       Posterior tibial pulses are 1+  on the right side and 1+ on the left side.     Heart sounds: Normal heart sounds.  Pulmonary:     Effort: Pulmonary effort is normal. No respiratory distress.     Breath sounds: Normal breath sounds. No wheezing.  Abdominal:     General: Bowel sounds are normal. There is no distension.     Palpations: Abdomen is soft.     Tenderness: There is no abdominal tenderness.  Musculoskeletal:     Cervical back: Neck supple.     Right lower leg: No edema.     Left lower leg: No edema.     Right foot: Normal range of motion.     Left foot: Normal range of motion.  Feet:     Right foot:     Protective Sensation: 8 sites tested.  10 sites sensed.     Skin integrity: Skin integrity normal.     Toenail Condition: Fungal disease present.    Left foot:     Protective Sensation: 8 sites tested.  10 sites sensed.     Skin integrity: Skin integrity normal.     Toenail Condition: Fungal disease present. Skin:    General: Skin is warm and dry.     Capillary Refill: Capillary refill takes less than 2 seconds.  Neurological:     General: No focal deficit present.     Mental Status: She is alert. Mental status is at baseline.     Motor: Weakness present.     Gait: Gait abnormal.     Comments: wheelchair  Psychiatric:        Mood and Affect: Mood normal.        Behavior: Behavior normal.     Labs reviewed: Recent Labs    09/26/21 0000 12/26/21 0000 06/12/22 0000  NA 140 138 140  K 4.7 3.9 4.4  CL 111* 108 105  CO2 20 21 24*  BUN 22* 25* 25*  CREATININE 1.1 1.2* 1.2*  CALCIUM 8.8 8.8 9.4   Recent Labs    09/26/21 0000 06/12/22 0000  AST 16 14  ALT 10 12  ALKPHOS 76 122  ALBUMIN 3.6 4.2   Recent Labs    09/26/21 0000 06/12/22 0000  WBC 9.5 9.8  NEUTROABS 5,909.00  --   HGB  13.6 14.4  HCT 41 43  PLT 306 381   Lab Results  Component Value Date   TSH 3.100 08/12/2019   Lab Results  Component Value Date   HGBA1C 7.0 06/12/2022   Lab Results  Component Value Date   CHOL 191 06/12/2022   HDL 45 06/12/2022   LDLCALC 114 06/12/2022   TRIG 196 (A) 06/12/2022   CHOLHDL 4.4 12/19/2018    Significant Diagnostic Results in last 30 days:  No results found.  Assessment/Plan 1. Mouth dryness - ongoing - no h/o sjogrens in current medical chart - improvement with Biotene spray - start biotene mouth wash- use twice daily after brushing teeth - consider ANA/rheumatoid factor labs in future  2. Poor dentition - missing teeth - no open sores or broken teeth - will schedule with in house dentist   3. Controlled type 2 diabetes mellitus with diabetic nephropathy, without long-term current use of insulin (HCC) - A1c 7.0 06/2022 - no hypoglycemia - foot exam done today - eye exam 03/2022 - urine microalbumin 12/2021 - cont metformin, glipizide, asa, and lisinopril  4. Hyperlipidemia associated with type 2 diabetes mellitus ([place]) - LDL 115 06/2022 - was on  pravastatin in past - left message for husband to discuss statin> husband agrees to restart statin - start pravasatin - lipid panel in 3 months   5. Mild dementia without behavioral disturbance, psychotic disturbance, mood disturbance, or anxiety, unspecified dementia type ([place]) - no behaviors - followed by neurology - was on hospice last year> discontinued - not on medication  6. Unable to walk - ambulates with wheelchair - no recent falls - cont skilled nursing  7. Primary hypertension - controlled with lisinopril and metoprolol  8. Stage 3a chronic kidney disease (HCC) - avoid nephrotoxic drugs like NSAIDS and dose adjust medications to be renally excreted - encourage hydration with water   9. Peripheral polyneuropathy - stable with gabapentin  10. Urge incontinence - off  medication  12. Painful micturition - improved with Diflucan  13. Recurrent depression ([place]) - ongoing - bored most of time> wants to go on a brief car ride and get off facility - cont zoloft     Family/ staff Communication: plan discussed with patient and nurse  Labs/tests ordered: A1c 10/09/2022

## 2022-09-15 MED ORDER — PRAVASTATIN SODIUM 10 MG PO TABS: 10.0000 mg | ORAL_TABLET | Freq: Every day | ORAL | 0 refills | Status: DC

## 2022-09-15 NOTE — Addendum Note (Signed)
Addended byWindell Moulding E on: 09/15/2022 08:55 AM   Modules accepted: Orders

## 2022-09-25 ENCOUNTER — Encounter: Payer: Self-pay | Admitting: Family Medicine

## 2022-09-25 ENCOUNTER — Non-Acute Institutional Stay: Payer: Medicare PPO | Admitting: Family Medicine

## 2022-09-25 VITALS — BP 132/84 | HR 60 | Temp 98.4°F | Resp 16

## 2022-09-25 DIAGNOSIS — R309 Painful micturition, unspecified: Secondary | ICD-10-CM

## 2022-09-25 DIAGNOSIS — K047 Periapical abscess without sinus: Secondary | ICD-10-CM | POA: Insufficient documentation

## 2022-09-25 DIAGNOSIS — G4700 Insomnia, unspecified: Secondary | ICD-10-CM

## 2022-09-25 NOTE — Progress Notes (Signed)
[facility] Consult Note Telephone: [phone]  Fax: [phone]   Date of encounter: 09/25/22 12:41 PM PATIENT NAME: [name] [name] [id number]   [id number] (home)  DOB: August 11, 1940 MRN: [id number] PRIMARY CARE PROVIDER:    Virgie Dad, MD,  [place] [address] [phone]  REFERRING PROVIDER:   Virgie Dad, MD [address] [place],  [place] [address] [phone]  Health Care Agent/Health Care Power of Attorney:    Contact Information     Name Relation Home Work [name] [name] [id number]  [phone]        I met face to face with patient In [facility]. Palliative Care was asked to follow this patient by consultation request of Virgie Dad, MD to address advance care planning and complex medical decision making. This is a follow up visit. Left vm for [name], [name] [name], to return call regarding visit.     CODE STATUS: MOST as of 03/24/21: DNR/DNI/Comfort measures No antibiotics, IV fluids or feeding tubes  ASSESSMENT AND / RECOMMENDATIONS:  PPS: 50%   Dental Abscess -  Recommend Augmentin 875/125mg  PO BID x 7 days       Peridex Mouth Rinse 15 mL PO         BID -        Recommend Dental Follow up.        Warm salt water rinses TID PRN        Windell Moulding, facility NP updated         Regarding findings Insomnia - Recommend Melatonin 5mg  PO QHS prn for sleep disturbance.  Atrophic Vaginitis -  Stable and symptoms controlled on current therapy.   Follow up Palliative Care Visit:  Palliative Care continuing to follow up by monitoring for changes in appetite, weight, functional and cognitive status for chronic disease progression and management in agreement with patient's stated goals of care. Next visit in 3-4 weeks or prn.  This visit was coded based on medical decision making (MDM).  Chief Complaint  Mouth/tongue pain    HISTORY OF PRESENT ILLNESS: [name] [name] is a 82 y.o. year old female with Sjogren's Syndrome. States that she is having moderate pain in left lateral tongue that started approx 1 week ago, stating "I think I may have bitten my tongue." Patient also volunteers that she has been losing teeth pretty frequently over the past few months related to her Sjogren's Syndrome. Denies seeing dentist as this time. Patient denies changes to taste at this time but states she does not chew on that side currently to avoid pain. When questioned about previous vaginal burning she had, previously states that it has improved.   ACTIVITIES OF DAILY LIVING: CONTINENT OF BLADDER? No CONTINENT OF BOWEL? Yes BATHING/DRESSING needs assist with BLE  MOBILITY: WC Dependent  AMBULATORY WITH ASSISTIVE DEVICE: WHEELCHAIR for mobility  APPETITE? Some difficulty due to impaired dentition, approx 75% of meal in room consumed   CURRENT PROBLEM LIST:  Patient Active Problem List   Diagnosis Date Noted   Urinary pain 07/05/2022   Dysuria 11/12/2021   Insomnia 12/23/2018   Urge incontinence 05/09/2017   Venous stasis 05/09/2017   Peripheral polyneuropathy 05/22/2016   Controlled diabetes mellitus with diabetic nephropathy, without long-term current use of insulin ([place]) 07/29/2015   IBS (irritable bowel syndrome) 12/01/2014   Vertical diplopia 11/17/2014   Non-melanoma skin cancer 05/13/2014   Seasonal  allergies 09/12/2011   Anxiety 09/12/2011   HTN (hypertension) 09/12/2011   Breast cancer ([place]) 09/12/2011   GERD (gastroesophageal reflux disease) 09/12/2011   Dyslipidemia 09/12/2011   Osteopenia 09/12/2011   DJD (degenerative joint disease) 09/12/2011   Factor 5 Leiden mutation, heterozygous ([place]) 09/12/2011   History of colonic polyps 09/12/2011   PAST MEDICAL HISTORY:  Active Ambulatory Problems    Diagnosis Date Noted   Seasonal allergies 09/12/2011   Anxiety 09/12/2011   HTN (hypertension) 09/12/2011    Breast cancer ([place]) 09/12/2011   GERD (gastroesophageal reflux disease) 09/12/2011   Dyslipidemia 09/12/2011   Osteopenia 09/12/2011   DJD (degenerative joint disease) 09/12/2011   Factor 5 Leiden mutation, heterozygous ([place]) 09/12/2011   History of colonic polyps 09/12/2011   Non-melanoma skin cancer 05/13/2014   Vertical diplopia 11/17/2014   IBS (irritable bowel syndrome) 12/01/2014   Controlled diabetes mellitus with diabetic nephropathy, without long-term current use of insulin ([place]) 07/29/2015   Peripheral polyneuropathy 05/22/2016   Urge incontinence 05/09/2017   Venous stasis 05/09/2017   Insomnia 12/23/2018   Dysuria 11/12/2021   Urinary pain 07/05/2022   Resolved Ambulatory Problems    Diagnosis Date Noted   Diabetes mellitus 09/12/2011   CRI (chronic renal insufficiency) 09/12/2011   Past Medical History:  Diagnosis Date   Allergy    Arthritis    Cancer ([place])    Clotting disorder ([place])    Diabetes mellitus    Diabetes mellitus without complication ([place])    Hypertension    SOCIAL HX:  Social History   Tobacco Use   Smoking status: Never   Smokeless tobacco: Never  Substance Use Topics   Alcohol use: No    Alcohol/week: 0.0 standard drinks of alcohol    Comment: rare   FAMILY HX:  Family History  Problem Relation Age of Onset   Heart disease Mother        AMI as cause of death age 23.   Heart disease Father        AMI as cause of death.   Heart disease Brother        Preferred Pharmacy: ALLERGIES:  Allergies  Allergen Reactions   Fruit & Vegetable Daily [Nutritional Supplements] Diarrhea   Mixed Ragweed    Onion Other (See Comments)    indigestion   Tree Extract Swelling   Sulfa Drugs Cross Reactors Rash    All over the body     PERTINENT MEDICATIONS:  Outpatient Encounter Medications as of 09/25/2022  Medication Sig   acetaminophen (TYLENOL) 500 MG tablet Take 2 tablets (1,000 mg total) by mouth every 8 (eight) hours as needed.    albuterol (VENTOLIN HFA) 108 (90 Base) MCG/ACT inhaler Inhale 2 puffs into the lungs every 6 (six) hours as needed for wheezing or shortness of breath.   aspirin 81 MG chewable tablet Chew by mouth daily.   clotrimazole (GYNE-LOTRIMIN) 1 % vaginal cream Place 1 Applicatorful vaginally at bedtime.   docusate sodium (COLACE) 100 MG capsule Take 1 capsule (100 mg total) by mouth 2 (two) times daily.   fluconazole (DIFLUCAN) 100 MG tablet Take 150 mg by mouth once a week.   fluticasone (FLONASE) 50 MCG/ACT nasal spray Place into both nostrils daily.   gabapentin (NEURONTIN) 100 MG capsule Take 200 mg by mouth 2 (two) times daily.   glipiZIDE (GLUCOTROL XL) 5 MG 24 hr tablet TAKE 1 TABLET EVERY DAY WITH BREAKFAST.   Hydrocortisone Acetate (ANUSOL-HC RE) Place 1 application  rectally as needed.  ketotifen (ZADITOR) 0.025 % ophthalmic solution Place 1 drop into both eyes every 12 (twelve) hours as needed.   lisinopril (ZESTRIL) 2.5 MG tablet Take 2.5 mg by mouth in the morning.   metFORMIN (GLUCOPHAGE) 500 MG tablet Take 1 tablet (500 mg total) by mouth 2 (two) times daily with a meal.   metoprolol succinate (TOPROL-XL) 100 MG 24 hr tablet Take 100 mg by mouth 2 (two) times daily. Take with or immediately following a meal.   montelukast (SINGULAIR) 10 MG tablet Take 10 mg by mouth every morning.   pravastatin (PRAVACHOL) 10 MG tablet Take 1 tablet (10 mg total) by mouth daily.   sertraline (ZOLOFT) 50 MG tablet Take 50 mg by mouth daily.   zinc oxide 20 % ointment Apply 1 application  topically as needed (To buttocks after every incontinent epsiode and as needed for redness).   No facility-administered encounter medications on file as of 09/25/2022.    History obtained from review of EMR, discussion with patient.    I reviewed available labs, medications, imaging, studies and related documents from the EMR.  There were no new records/imaging since last visit. Records reviewed and summarized above.    Physical Exam: GENERAL: NAD HEENT: Multiple missing and various stages of eroded teeth present. Small erythematous abscess noted to L Inferior gum line present in front of last molar.  Molar in front of abscess area significantly broken and eroded and abscess appears to be circumferential and possibly tunneling underneath intact tooth. Yellow drainage present. No redness or edema noted to jaw.  No TTP or lymphadenopathy with palpation.  LUNGS: CTAB, no increased work of breathing, room air CARDIAC:  S1S2, RRR with no MRG, N peripheral edema, N cyanosis ABD:  Normo-active BS x 4 quads, soft, non-tender, non-distended EXTREMITIES: no deformity, N muscle atrophy/subcutaneous fat loss NEURO: Forgetful  PSYCH:  non-anxious affect, A & O x 3  Thank you for the opportunity to participate in the care of Marletta Ola. Please call our main office at [phone] if we can be of additional assistance.    Damaris Hippo FNP-C  [email or web address]  Phone:  [phone]

## 2022-10-13 ENCOUNTER — Encounter: Payer: Self-pay | Admitting: Orthopedic Surgery

## 2022-10-13 ENCOUNTER — Non-Acute Institutional Stay (SKILLED_NURSING_FACILITY): Payer: Medicare PPO | Admitting: Orthopedic Surgery

## 2022-10-13 DIAGNOSIS — R309 Painful micturition, unspecified: Secondary | ICD-10-CM

## 2022-10-13 DIAGNOSIS — F339 Major depressive disorder, recurrent, unspecified: Secondary | ICD-10-CM

## 2022-10-13 DIAGNOSIS — R262 Difficulty in walking, not elsewhere classified: Secondary | ICD-10-CM

## 2022-10-13 DIAGNOSIS — K30 Functional dyspepsia: Secondary | ICD-10-CM

## 2022-10-13 DIAGNOSIS — I10 Essential (primary) hypertension: Secondary | ICD-10-CM

## 2022-10-13 DIAGNOSIS — G629 Polyneuropathy, unspecified: Secondary | ICD-10-CM

## 2022-10-13 DIAGNOSIS — E785 Hyperlipidemia, unspecified: Secondary | ICD-10-CM

## 2022-10-13 DIAGNOSIS — E1169 Type 2 diabetes mellitus with other specified complication: Secondary | ICD-10-CM | POA: Diagnosis not present

## 2022-10-13 DIAGNOSIS — N3941 Urge incontinence: Secondary | ICD-10-CM

## 2022-10-13 DIAGNOSIS — F03A0 Unspecified dementia, mild, without behavioral disturbance, psychotic disturbance, mood disturbance, and anxiety: Secondary | ICD-10-CM

## 2022-10-13 DIAGNOSIS — N1831 Chronic kidney disease, stage 3a: Secondary | ICD-10-CM

## 2022-10-13 DIAGNOSIS — E1121 Type 2 diabetes mellitus with diabetic nephropathy: Secondary | ICD-10-CM | POA: Diagnosis not present

## 2022-10-13 DIAGNOSIS — R682 Dry mouth, unspecified: Secondary | ICD-10-CM

## 2022-10-13 MED ORDER — FLUCONAZOLE 150 MG PO TABS: 150.0000 mg | ORAL_TABLET | Freq: Once | ORAL | 0 refills | Status: AC

## 2022-10-13 MED ORDER — CALCIUM CARBONATE ANTACID 500 MG PO CHEW: 1.0000 | CHEWABLE_TABLET | Freq: Three times a day (TID) | ORAL | 0 refills | Status: DC | PRN

## 2022-10-13 NOTE — Progress Notes (Signed)
Location:  [facility] Room Number: 26 Place of Service:  SNF (31) Provider:  Octavia Heir, NP    Patient Care Team: [name], MD as PCP - General (Internal Medicine) Ernesto Rutherford, MD (Ophthalmology) Claud Kelp, MD as Consulting Physician (General Surgery) Carlus Pavlov, MD as Consulting Physician (Internal Medicine)  Extended Emergency Contact Information Primary Emergency Contact: [name],[name] Address: [address]           [address] [place] Home Phone: [phone] Mobile Phone: [phone] Relation: Spouse  Code Status:  DNR Goals of care: Advanced Directive information    10/13/2022    9:35 AM  Advanced Directives  Does Patient Have a Medical Advance Directive? Yes  Type of Estate agent of [place];Living will;Out of facility DNR (pink MOST or yellow form)  Does patient want to make changes to medical advance directive? No - Patient declined  Copy of Healthcare Power of Attorney in Chart? Yes - validated most recent copy scanned in chart (See row information)  Pre-existing out of facility DNR order (yellow form or pink MOST form) Yellow form placed in chart (order not valid for inpatient use)     Chief Complaint  Patient presents with   Medical Management of Chronic Issues    Routine visit. Discuss need for additional covid boosters or post pone if patient refuses or is not a candidate.     HPI:  Pt is a 82 y.o. female seen today for medical management of chronic diseases.    She currently resides on the skilled nursing unit at [facility]. PMH: HTN, HLD, GERD, IBS, T2DM, polyneuropathy, DJD, osteopenia, Factor 5 mutation, breast cancer s/p  left lumpectomy 1996, incontinence, and anxiety.     T2DM- A1c 7.0 (12/04)> was 6.9 (08/28)> was 8.3 (06/09), urine microalbumin 151 12/13/2021, no hypoglycemic events, blood sugars 150-210's, remains on metformin, glipizide, asa, and  lisinopril, off Invokana due to frequent genitourinary infections, last eye exam 03/2022 Dementia-  MMSE 25/30 2021, BIMS score 15/15 (03/21)> was 14 /15 (12/19), diagnosed with neurodegenerative disorder per neurology, unable to ambulate,  no behaviors, not on medication HTN- BUN/creat 25/1.2 06/12/2022, remains on metoprolol and lisinopril CKD- see above Peripheral neuropathy- remains on gabapentin Urge incontinence- ditropan discontinued due to dysuria, urology recommended Gemtesa or Myrbetriq if incontinence persists Painful micturition- increased pain when urinating, 09/06 urine culture negative for growth, CT abdomen/pelvis negative for hydronephrosis, renal calculi, neoplasm, improved with scheduled Diflucan Depression- Na+ 140 06/12/2022, improved mood> husband got puppy/ she is getting nails painted more, remains on Zoloft  Dry mouth/ poor dentition-  past h/o Sjogren, remains on biotene rinse Indigestion- requesting TUMS prn  Recent blood pressures:  04/02- 96/62  03/26- 104/62  03/19- 121/70  Recent weights;  04/01- 163 lbs  03/01- 164.3 lbs  02/01- 166.8 lbs   Past Medical History:  Diagnosis Date   Allergy    [name]; allergy shots weekly.     Anxiety    Arthritis    DDD lumbar spine.     Cancer    breast L x 2; skin basal cell carcinoma [name]).   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   Past Surgical History:  Procedure Laterality Date   BREAST LUMPECTOMY  1996   L breast cancer   BREAST SURGERY N/A    Phreesia 12/12/2019   EYE SURGERY N/A    Phreesia 12/12/2019   MASTECTOMY  2002   Bilateral for breast cancer   VAGINAL HYSTERECTOMY  1985   DUB; uterine fibroids; ovaries intact.    Allergies  Allergen Reactions   Fruit & Vegetable Daily [Nutritional  Supplements] Diarrhea   Mixed Ragweed    Onion Other (See Comments)    indigestion   Tree Extract Swelling   Sulfa Drugs Cross Reactors Rash    All over the body    Outpatient Encounter Medications as of 10/13/2022  Medication Sig   acetaminophen (TYLENOL) 500 MG tablet Take 2 tablets (1,000 mg total) by mouth every 8 (eight) hours as needed.   albuterol (VENTOLIN HFA) 108 (90 Base) MCG/ACT inhaler Inhale 2 puffs into the lungs every 6 (six) hours as needed for wheezing or shortness of breath.   antiseptic oral rinse (BIOTENE) LIQD 30 mLs by Mouth Rinse route 2 (two) times daily. For dry mouth   aspirin 81 MG chewable tablet Chew by mouth daily.   clotrimazole (GYNE-LOTRIMIN) 1 % vaginal cream Place 1 Applicatorful vaginally at bedtime.   docusate sodium (COLACE) 100 MG capsule Take 1 capsule (100 mg total) by mouth 2 (two) times daily.   fluconazole (DIFLUCAN) 100 MG tablet Take 150 mg by mouth once a week.   fluticasone (FLONASE) 50 MCG/ACT nasal spray Place 2 sprays into both nostrils daily.   gabapentin (NEURONTIN) 100 MG capsule Take 200 mg by mouth 2 (two) times daily.   glipiZIDE (GLUCOTROL XL) 5 MG 24 hr tablet TAKE 1 TABLET EVERY DAY WITH BREAKFAST.   Hydrocortisone Acetate (ANUSOL-HC RE) Place 1 application  rectally as needed.   ketotifen (ZADITOR) 0.025 % ophthalmic solution Place 1 drop into both eyes every 12 (twelve) hours as needed.   lisinopril (ZESTRIL) 2.5 MG tablet Take 2.5 mg by mouth in the morning.   Melatonin 5 MG CAPS Take 1 capsule by mouth daily. As needed for insomnia   metFORMIN (GLUCOPHAGE) 500 MG tablet Take 1 tablet (500 mg total) by mouth 2 (two) times daily with a meal.   metoprolol succinate (TOPROL-XL) 100 MG 24 hr tablet Take 100 mg by mouth 2 (two) times daily. Take with or immediately following a meal.   montelukast (SINGULAIR) 10 MG tablet Take 10 mg by mouth every morning.   pravastatin (PRAVACHOL) 10 MG tablet Take 1 tablet (10 mg total) by mouth  daily.   sertraline (ZOLOFT) 50 MG tablet Take 50 mg by mouth daily.   zinc oxide 20 % ointment Apply 1 application  topically as needed (To buttocks after every incontinent epsiode and as needed for redness).   No facility-administered encounter medications on file as of 10/13/2022.    Review of Systems  Constitutional:  Negative for activity change and appetite change.  HENT:  Positive for dental problem. Negative for congestion and trouble swallowing.   Eyes:  Negative for visual disturbance.  Respiratory:  Negative for cough, shortness of breath and wheezing.   Cardiovascular:  Negative for chest pain and leg swelling.  Gastrointestinal:  Negative for abdominal distention and abdominal pain.  Genitourinary:  Positive for dysuria and urgency. Negative for frequency, hematuria and vaginal discharge.  Musculoskeletal:  Positive for gait problem.  Skin:  Negative for wound.  Neurological:  Positive for weakness. Negative for dizziness and headaches.  Psychiatric/Behavioral:  Positive for confusion and dysphoric mood. Negative  for sleep disturbance. The patient is not nervous/anxious.     Immunization History  Administered Date(s) Administered   Fluad Quad(high Dose 65+) 05/03/2022   Influenza Split 03/19/2012, 03/19/2013   Influenza, High Dose Seasonal PF 04/09/2014, 03/22/2015, 03/24/2016, 03/29/2018, 04/16/2020   Influenza,inj,Quad PF,6+ Mos 04/09/2014, 03/22/2015, 03/24/2016   Influenza-Unspecified 03/19/2012, 03/19/2013, 04/09/2017, 03/24/2018, 04/16/2020   Moderna SARS-COV2 Booster Vaccination 05/16/2022   Moderna Sars-Covid-2 Vaccination 05/19/2021   PFIZER(Purple Top)SARS-COV-2 Vaccination 08/17/2019, 09/10/2019, 04/06/2020, 11/19/2020   Pfizer Covid-19 Vaccine Bivalent Booster 7951yrs & up 11/18/2020   Pneumococcal Conjugate-13 11/05/2014   Pneumococcal Polysaccharide-23 07/10/2004, 12/12/2010, 05/02/2016   Pneumococcal-Unspecified 07/10/2004, 12/12/2010, 05/02/2016   Td  07/10/2009   Tdap 06/06/2022   Zoster Recombinat (Shingrix) 04/09/2017, 07/19/2017   Zoster, Live 07/10/2006   Pertinent  Health Maintenance Due  Topic Date Due   HEMOGLOBIN A1C  12/12/2022   INFLUENZA VACCINE  02/08/2023   OPHTHALMOLOGY EXAM  03/18/2023   FOOT EXAM  09/13/2023   DEXA SCAN  Completed      12/15/2019    4:00 PM 12/25/2019    2:02 PM 09/18/2020   12:44 PM 01/20/2022   12:54 PM 07/31/2022    9:44 AM  Fall Risk  Falls in the past year? 0 0  1 0  Was there an injury with Fall?  0  0 0  Fall Risk Category Calculator  0  1 0  Fall Risk Category (Retired)  Low  Low   (RETIRED) Patient Fall Risk Level  Low fall risk Low fall risk High fall risk   Patient at Risk for Falls Due to    History of fall(s);Impaired balance/gait;Impaired mobility History of fall(s);Impaired mobility;Impaired balance/gait  Fall risk Follow up Falls evaluation completed Falls evaluation completed;Education provided  Falls evaluation completed;Education provided;Falls prevention discussed Falls evaluation completed   Functional Status Survey:    Vitals:   10/13/22 0934  BP: 96/62  Pulse: 74  SpO2: 98%  Weight: 164 lb 4.8 oz (74.5 kg)  Height: 5\' 4"  (1.626 m)   Body mass index is 28.2 kg/m. Physical Exam Vitals reviewed.  Constitutional:      General: She is not in acute distress. HENT:     Head: Normocephalic.     Right Ear: There is no impacted cerumen.     Left Ear: There is no impacted cerumen.     Nose: Nose normal.     Mouth/Throat:     Mouth: Mucous membranes are moist.     Comments: Missing teeth, no oral lesions  Eyes:     General:        Right eye: No discharge.        Left eye: No discharge.  Cardiovascular:     Rate and Rhythm: Normal rate and regular rhythm.     Pulses: Normal pulses.     Heart sounds: Normal heart sounds.  Pulmonary:     Effort: Pulmonary effort is normal. No respiratory distress.     Breath sounds: Normal breath sounds. No wheezing.  Abdominal:      General: Bowel sounds are normal. There is no distension.     Palpations: Abdomen is soft.     Tenderness: There is no abdominal tenderness.  Musculoskeletal:     Cervical back: Neck supple.     Right lower leg: No edema.     Left lower leg: No edema.  Skin:    General: Skin is warm and dry.     Capillary Refill: Capillary refill takes less  than 2 seconds.  Neurological:     Mental Status: She is alert.     Labs reviewed: Recent Labs    12/26/21 0000 06/12/22 0000  NA 138 140  K 3.9 4.4  CL 108 105  CO2 21 24*  BUN 25* 25*  CREATININE 1.2* 1.2*  CALCIUM 8.8 9.4   Recent Labs    06/12/22 0000  AST 14  ALT 12  ALKPHOS 122  ALBUMIN 4.2   Recent Labs    06/12/22 0000  WBC 9.8  HGB 14.4  HCT 43  PLT 381   Lab Results  Component Value Date   TSH 3.100 08/12/2019   Lab Results  Component Value Date   HGBA1C 7.0 06/12/2022   Lab Results  Component Value Date   CHOL 191 06/12/2022   HDL 45 06/12/2022   LDLCALC 114 06/12/2022   TRIG 196 (A) 06/12/2022   CHOLHDL 4.4 12/19/2018    Significant Diagnostic Results in last 30 days:  No results found.  Assessment/Plan 1. Controlled type 2 diabetes mellitus with diabetic nephropathy, without long-term current use of insulin - A1c 7.0, sugars 150-210's, no hypoglycemia - eye exam 03/2022 - cont metformin, asa, statin, ACE - A1c 04/08  2. Hyperlipidemia associated with type 2 diabetes mellitus - LDL 114, goal < 70 - cont statin  3. 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 Unable to walk - cont skilled nursing care  5. Primary hypertension - trending down?  - blood pressures BID x 7 days> give to provider  - cont metoprolol and ACE  6. Stage 3a chronic kidney disease - avoid nephrotoxic drugs like NSAIDS and dose adjust  medications to be renally excreted - encourage hydration with water   7. Peripheral polyneuropathy - cont gabapentin  8. Urge incontinence - followed by urology> recommend Gemtesa or Myrbetriq if it worsens  9. Painful micturition - ongoing - past workup revealed yeast - increased dysuria today - start diflucan 150 mg po once - cont scheduled diflucan  10. Recurrent depression - improved mood since husband got puppy - cont Zoloft  11. Mouth dryness - h/o Sjogrens - cont Biotene rinses  12. Indigestion - start TUMS 500 mg- 1-2 tablets TID prn - advised to sit up after eating     Family/ staff Communication: plan discussed with patient and nurse  Labs/tests ordered:  A1c

## 2022-10-17 LAB — HEMOGLOBIN A1C: Hemoglobin A1C: 7.6

## 2022-11-09 ENCOUNTER — Encounter: Payer: Self-pay | Admitting: Internal Medicine

## 2022-11-09 ENCOUNTER — Non-Acute Institutional Stay (SKILLED_NURSING_FACILITY): Payer: Medicare PPO | Admitting: Internal Medicine

## 2022-11-09 DIAGNOSIS — E1121 Type 2 diabetes mellitus with diabetic nephropathy: Secondary | ICD-10-CM | POA: Diagnosis not present

## 2022-11-09 DIAGNOSIS — F03A0 Unspecified dementia, mild, without behavioral disturbance, psychotic disturbance, mood disturbance, and anxiety: Secondary | ICD-10-CM | POA: Diagnosis not present

## 2022-11-09 DIAGNOSIS — E1169 Type 2 diabetes mellitus with other specified complication: Secondary | ICD-10-CM

## 2022-11-09 DIAGNOSIS — B3731 Acute candidiasis of vulva and vagina: Secondary | ICD-10-CM

## 2022-11-09 DIAGNOSIS — I10 Essential (primary) hypertension: Secondary | ICD-10-CM | POA: Diagnosis not present

## 2022-11-09 DIAGNOSIS — N1831 Chronic kidney disease, stage 3a: Secondary | ICD-10-CM

## 2022-11-09 DIAGNOSIS — G629 Polyneuropathy, unspecified: Secondary | ICD-10-CM

## 2022-11-09 DIAGNOSIS — E785 Hyperlipidemia, unspecified: Secondary | ICD-10-CM

## 2022-11-09 DIAGNOSIS — F339 Major depressive disorder, recurrent, unspecified: Secondary | ICD-10-CM

## 2022-11-09 NOTE — Progress Notes (Signed)
Location:  [facility] Room Number: 26A Place of Service:  Nursing [phone]) Provider:  [name], MD   [name], MD  Patient Care Team: [name], MD as PCP - General (Internal Medicine) Ernesto Rutherford, MD (Ophthalmology) Claud Kelp, MD as Consulting Physician (General Surgery) Carlus Pavlov, MD as Consulting Physician (Internal Medicine)  Extended Emergency Contact Information Primary Emergency Contact: [name],[name] Address: [address]          [place] [address] [place] Home Phone: [phone] Mobile Phone: [phone] Relation: Spouse  Code Status:  DNR palliative care Goals of care: Advanced Directive information    11/09/2022   11:45 AM  Advanced Directives  Does Patient Have a Medical Advance Directive? Yes  Type of Estate agent of [place];Living will;Out of facility DNR (pink MOST or yellow form)  Does patient want to make changes to medical advance directive? No - Patient declined  Copy of Healthcare Power of Attorney in Chart? Yes - validated most recent copy scanned in chart (See row information)  Pre-existing out of facility DNR order (yellow form or pink MOST form) Yellow form placed in chart (order not valid for inpatient use)     Chief Complaint  Patient presents with   Medical Management of Chronic Issues    Patient is being seen for Routine visit     HPI:  Pt is a 82 y.o. female seen today for medical management of chronic diseases.   Lives in SNF Type 2 Diabetes Mellitus    Neurodegenerative Dementia Seen Neurology in 03/21 MMSE 25/30 MRI Moderate atrophy. Mild progression of white matter changes most likely due to chronic microvascular ischemia Urinary Incontinence HTN Insmonia, And Neuropathy Chronic Candidiasis     She is stable. No new Nursing issues. No Behavior issues Her weight is stable She was trying to tell me something is wrong with her  stomach but then denied nausea and Vomiting or Constipation or abdominal Pain I d/w nurse also and they didn't know anything new No Falls Wt Readings from Last 3 Encounters:  11/09/22 164 lb 4.8 oz (74.5 kg)  10/13/22 164 lb 4.8 oz (74.5 kg)  09/13/22 164 lb 4.8 oz (74.5 kg)    Past Medical History:  Diagnosis Date   Allergy    [name]; allergy shots weekly.     Anxiety    Arthritis    DDD lumbar spine.     Cancer (HCC)    breast L x 2; skin basal cell carcinoma [name]).   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   Past Surgical History:  Procedure Laterality Date   BREAST LUMPECTOMY  1996   L breast cancer   BREAST SURGERY N/A    Phreesia 12/12/2019   EYE SURGERY N/A    Phreesia 12/12/2019   MASTECTOMY  2002   Bilateral for breast cancer   VAGINAL HYSTERECTOMY  1985   DUB; uterine fibroids; ovaries intact.    Allergies  Allergen Reactions   Fruit & Vegetable Daily [Nutritional Supplements] Diarrhea   Mixed Ragweed    Onion Other (See Comments)    indigestion   Tree Extract Swelling   Sulfa Drugs Cross Reactors Rash    All over the body  Outpatient Encounter Medications as of 11/09/2022  Medication Sig   acetaminophen (TYLENOL) 500 MG tablet Take 2 tablets (1,000 mg total) by mouth every 8 (eight) hours as needed.   albuterol (VENTOLIN HFA) 108 (90 Base) MCG/ACT inhaler Inhale 2 puffs into the lungs every 6 (six) hours as needed for wheezing or shortness of breath.   antiseptic oral rinse (BIOTENE) LIQD 30 mLs by Mouth Rinse route 2 (two) times daily. For dry mouth   aspirin 81 MG chewable tablet Chew by mouth daily.   calcium carbonate (TUMS) 500 MG chewable tablet Chew 1-2 tablets (200-400 mg of elemental calcium total) by mouth 3 (three) times daily as needed  for indigestion or heartburn.   clotrimazole (GYNE-LOTRIMIN) 1 % vaginal cream Place 1 Applicatorful vaginally at bedtime.   docusate sodium (COLACE) 100 MG capsule Take 1 capsule (100 mg total) by mouth 2 (two) times daily.   fluconazole (DIFLUCAN) 100 MG tablet Take 150 mg by mouth once a week.   fluticasone (FLONASE) 50 MCG/ACT nasal spray Place 2 sprays into both nostrils daily.   gabapentin (NEURONTIN) 100 MG capsule Take 200 mg by mouth 2 (two) times daily.   glipiZIDE (GLUCOTROL XL) 5 MG 24 hr tablet TAKE 1 TABLET EVERY DAY WITH BREAKFAST.   Hydrocortisone Acetate (ANUSOL-HC RE) Place 1 application  rectally as needed.   ketotifen (ZADITOR) 0.025 % ophthalmic solution Place 1 drop into both eyes every 12 (twelve) hours as needed.   lisinopril (ZESTRIL) 2.5 MG tablet Take 2.5 mg by mouth in the morning.   Melatonin 5 MG CAPS Take 1 capsule by mouth daily. As needed for insomnia   metFORMIN (GLUCOPHAGE) 500 MG tablet Take 1 tablet (500 mg total) by mouth 2 (two) times daily with a meal.   metoprolol succinate (TOPROL-XL) 100 MG 24 hr tablet Take 100 mg by mouth 2 (two) times daily. Take with or immediately following a meal.   montelukast (SINGULAIR) 10 MG tablet Take 10 mg by mouth every morning.   pravastatin (PRAVACHOL) 10 MG tablet Take 1 tablet (10 mg total) by mouth daily.   sertraline (ZOLOFT) 50 MG tablet Take 50 mg by mouth daily.   zinc oxide 20 % ointment Apply 1 application  topically as needed (To buttocks after every incontinent epsiode and as needed for redness).   No facility-administered encounter medications on file as of 11/09/2022.    Review of Systems She has Aphasia and Cognitive impairnment  Immunization History  Administered Date(s) Administered   Fluad Quad(high Dose 65+) 05/03/2022   Influenza Split 03/19/2012, 03/19/2013   Influenza, High Dose Seasonal PF 04/09/2014, 03/22/2015, 03/24/2016, 03/29/2018, 04/16/2020   Influenza,inj,Quad PF,6+ Mos 04/09/2014,  03/22/2015, 03/24/2016   Influenza-Unspecified 03/19/2012, 03/19/2013, 04/09/2017, 03/24/2018, 04/16/2020   Moderna SARS-COV2 Booster Vaccination 05/16/2022   Moderna Sars-Covid-2 Vaccination 05/19/2021   PFIZER(Purple Top)SARS-COV-2 Vaccination 08/17/2019, 09/10/2019, 04/06/2020, 11/19/2020   Pfizer Covid-19 Vaccine Bivalent Booster 49yrs & up 11/18/2020   Pneumococcal Conjugate-13 11/05/2014   Pneumococcal Polysaccharide-23 07/10/2004, 12/12/2010, 05/02/2016   Pneumococcal-Unspecified 07/10/2004, 12/12/2010, 05/02/2016   Td 07/10/2009   Tdap 06/06/2022   Zoster Recombinat (Shingrix) 04/09/2017, 07/19/2017   Zoster, Live 07/10/2006   Pertinent  Health Maintenance Due  Topic Date Due   HEMOGLOBIN A1C  12/12/2022   INFLUENZA VACCINE  02/08/2023   OPHTHALMOLOGY EXAM  03/18/2023   FOOT EXAM  09/13/2023   DEXA SCAN  Completed      12/15/2019    4:00 PM 12/25/2019    2:02 PM 09/18/2020  12:44 PM 01/20/2022   12:54 PM 07/31/2022    9:44 AM  Fall Risk  Falls in the past year? 0 0  1 0  Was there an injury with Fall?  0  0 0  Fall Risk Category Calculator  0  1 0  Fall Risk Category (Retired)  Low  Low   (RETIRED) Patient Fall Risk Level  Low fall risk Low fall risk High fall risk   Patient at Risk for Falls Due to    History of fall(s);Impaired balance/gait;Impaired mobility History of fall(s);Impaired mobility;Impaired balance/gait  Fall risk Follow up Falls evaluation completed Falls evaluation completed;Education provided  Falls evaluation completed;Education provided;Falls prevention discussed Falls evaluation completed   Functional Status Survey:    Vitals:   11/09/22 1126  BP: 121/64  Pulse: 68  Resp: 20  Temp: 97.9 F (36.6 C)  TempSrc: Temporal  SpO2: 95%  Weight: 164 lb 4.8 oz (74.5 kg)  Height: 5\' 4"  (1.626 m)   Body mass index is 28.2 kg/m. Physical Exam  Labs reviewed: Recent Labs    12/26/21 0000 06/12/22 0000  NA 138 140  K 3.9 4.4  CL 108 105  CO2  21 24*  BUN 25* 25*  CREATININE 1.2* 1.2*  CALCIUM 8.8 9.4   Recent Labs    06/12/22 0000  AST 14  ALT 12  ALKPHOS 122  ALBUMIN 4.2   Recent Labs    06/12/22 0000  WBC 9.8  HGB 14.4  HCT 43  PLT 381   Lab Results  Component Value Date   TSH 3.100 08/12/2019   Lab Results  Component Value Date   HGBA1C 7.0 06/12/2022   Lab Results  Component Value Date   CHOL 191 06/12/2022   HDL 45 06/12/2022   LDLCALC 114 06/12/2022   TRIG 196 (A) 06/12/2022   CHOLHDL 4.4 12/19/2018    Significant Diagnostic Results in last 30 days:  No results found.  Assessment/Plan 1. Controlled type 2 diabetes mellitus with diabetic nephropathy, without long-term current use of insulin (HCC) A1C 7.6 in 04/24 On Metformin,and Glipizide Eye exam 09/23  2. Hyperlipidemia associated with type 2 diabetes mellitus (HCC) Now on Pravastatin low dose  3. Mild dementia without behavioral disturbance, psychotic disturbance, mood disturbance, or anxiety, unspecified dementia type (HCC) SNF level of care  4. Primary hypertension Lisinopril and Metorpolol  5. Stage 3a chronic kidney disease (HCC) Creat stable Low dose of Lisinopril  6. Peripheral polyneuropathy Gabapentin  7. Recurrent depression (HCC) Zoloft no GDR 8. Recurent Vaginal candidiasis Low dose of Diflucan    Family/ staff Communication:   Labs/tests ordered:

## 2022-11-23 LAB — CBC AND DIFFERENTIAL
HCT: 36 (ref 36–46)
Hemoglobin: 12 (ref 12.0–16.0)
Neutrophils Absolute: 6017
Platelets: 323 10*3/uL (ref 150–400)
WBC: 9.2

## 2022-11-23 LAB — BASIC METABOLIC PANEL
BUN: 21 (ref 4–21)
CO2: 22 (ref 13–22)
Chloride: 107 (ref 99–108)
Creatinine: 1.3 — AB (ref 0.5–1.1)
Glucose: 132
Potassium: 4.5 mEq/L (ref 3.5–5.1)
Sodium: 139 (ref 137–147)

## 2022-11-23 LAB — COMPREHENSIVE METABOLIC PANEL
Calcium: 8.8 (ref 8.7–10.7)
eGFR: 42

## 2022-11-23 LAB — CBC: RBC: 4.38 (ref 3.87–5.11)

## 2022-11-23 LAB — HEMOGLOBIN A1C: Hemoglobin A1C: 7.2

## 2022-11-24 ENCOUNTER — Encounter: Payer: Self-pay | Admitting: Orthopedic Surgery

## 2022-11-24 ENCOUNTER — Non-Acute Institutional Stay (SKILLED_NURSING_FACILITY): Payer: Medicare PPO | Admitting: Orthopedic Surgery

## 2022-11-24 DIAGNOSIS — E1121 Type 2 diabetes mellitus with diabetic nephropathy: Secondary | ICD-10-CM | POA: Diagnosis not present

## 2022-11-24 MED ORDER — METFORMIN HCL 500 MG PO TABS: ORAL_TABLET | ORAL | 3 refills | Status: DC

## 2022-11-24 MED ORDER — GLIPIZIDE ER 10 MG PO TB24: 10.0000 mg | ORAL_TABLET | Freq: Every day | ORAL | 0 refills | Status: DC

## 2022-11-24 NOTE — Progress Notes (Signed)
Location:  [facility] Room Number: NO/26/A Place of Service:  SNF [phone]) Provider:  Hazle Nordmann, NP  [name], MD  Patient Care Team: [name], MD as PCP - General (Internal Medicine) Ernesto Rutherford, MD (Ophthalmology) Claud Kelp, MD as Consulting Physician (General Surgery) Carlus Pavlov, MD as Consulting Physician (Internal Medicine)  Extended Emergency Contact Information Primary Emergency Contact: [name],[name] Address: [address]          [place] [address] [place] Home Phone: [phone] Mobile Phone: [phone] Relation: Spouse  Code Status:  DNR Goals of care: Advanced Directive information    11/09/2022   11:45 AM  Advanced Directives  Does Patient Have a Medical Advance Directive? Yes  Type of Estate agent of [place];Living will;Out of facility DNR (pink MOST or yellow form)  Does patient want to make changes to medical advance directive? No - Patient declined  Copy of Healthcare Power of Attorney in Chart? Yes - validated most recent copy scanned in chart (See row information)  Pre-existing out of facility DNR order (yellow form or pink MOST form) Yellow form placed in chart (order not valid for inpatient use)     Chief Complaint  Patient presents with   Acute Visit    Patient is being seen for low GFR results    HPI:  Pt is a 82 y.o. female seen today for an acute visit due to abnormal lab results.   She currently resides on the skilled nursing unit at [facility]. PMH: HTN, HLD, GERD, IBS, T2DM, polyneuropathy, DJD, osteopenia, Factor 5 mutation, breast cancer s/p  left lumpectomy 1996, incontinence, and anxiety.   A1c increased to 7.6 (04/09)> was 7.0 (12/04). Metformin was increased to 500 mg TID. She is also on Glipizide 5 mg daily. Today, BUN/creat 21/1.28, GFR 42. BUN/creat 18/1.18, GFR 47 06/12/2022. No recent hypoglycemias. Blood sugars averaging 140-160's.     Past Medical History:  Diagnosis Date   Allergy    [name]; allergy shots weekly.     Anxiety    Arthritis    DDD lumbar spine.     Cancer (HCC)    breast L x 2; skin basal cell carcinoma [name]).   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   Past Surgical History:  Procedure Laterality Date   BREAST LUMPECTOMY  1996   L breast cancer   BREAST SURGERY N/A    Phreesia 12/12/2019   EYE SURGERY N/A    Phreesia 12/12/2019   MASTECTOMY  2002   Bilateral for breast cancer   VAGINAL HYSTERECTOMY  1985   DUB; uterine fibroids; ovaries intact.    Allergies  Allergen Reactions   Fruit & Vegetable Daily [Nutritional Supplements] Diarrhea   Mixed Ragweed    Onion Other (See Comments)    indigestion   Tree Extract Swelling   Sulfa Drugs Cross Reactors Rash    All over the body    Outpatient Encounter Medications as of 11/24/2022  Medication Sig   acetaminophen (TYLENOL) 500 MG tablet Take 2 tablets (1,000 mg total) by mouth every 8 (eight) hours as needed.   albuterol (VENTOLIN HFA) 108 (90 Base) MCG/ACT inhaler Inhale 2 puffs into the lungs every 6 (six) hours as  needed for wheezing or shortness of breath.   antiseptic oral rinse (BIOTENE) LIQD 30 mLs by Mouth Rinse route 2 (two) times daily. For dry mouth   aspirin 81 MG chewable tablet Chew by mouth daily.   calcium carbonate (TUMS) 500 MG chewable tablet Chew 1-2 tablets (200-400 mg of elemental calcium total) by mouth 3 (three) times daily as needed for indigestion or heartburn.   clotrimazole (GYNE-LOTRIMIN) 1 % vaginal cream Place 1 Applicatorful vaginally at bedtime.   docusate sodium (COLACE) 100 MG capsule Take 1 capsule (100 mg total) by mouth 2 (two) times daily.   fluconazole (DIFLUCAN) 100 MG tablet Take 150 mg by  mouth once a week.   fluticasone (FLONASE) 50 MCG/ACT nasal spray Place 2 sprays into both nostrils daily.   gabapentin (NEURONTIN) 100 MG capsule Take 200 mg by mouth 2 (two) times daily.   glipiZIDE (GLUCOTROL XL) 5 MG 24 hr tablet TAKE 1 TABLET EVERY DAY WITH BREAKFAST.   Hydrocortisone Acetate (ANUSOL-HC RE) Place 1 application  rectally as needed.   ketotifen (ZADITOR) 0.025 % ophthalmic solution Place 1 drop into both eyes every 12 (twelve) hours as needed.   lisinopril (ZESTRIL) 2.5 MG tablet Take 2.5 mg by mouth in the morning.   Melatonin 5 MG CAPS Take 1 capsule by mouth daily. As needed for insomnia   metFORMIN (GLUCOPHAGE) 500 MG tablet Take 1 tablet (500 mg total) by mouth 2 (two) times daily with a meal.   metoprolol succinate (TOPROL-XL) 100 MG 24 hr tablet Take 100 mg by mouth 2 (two) times daily. Take with or immediately following a meal.   montelukast (SINGULAIR) 10 MG tablet Take 10 mg by mouth every morning.   pravastatin (PRAVACHOL) 10 MG tablet Take 1 tablet (10 mg total) by mouth daily.   sertraline (ZOLOFT) 50 MG tablet Take 50 mg by mouth daily.   zinc oxide 20 % ointment Apply 1 application  topically as needed (To buttocks after every incontinent epsiode and as needed for redness).   No facility-administered encounter medications on file as of 11/24/2022.    Review of Systems  Constitutional:  Negative for activity change and appetite change.  HENT:  Negative for congestion.   Eyes:  Negative for visual disturbance.  Respiratory:  Negative for cough, shortness of breath and wheezing.   Gastrointestinal:  Negative for abdominal distention and abdominal pain.  Endocrine: Negative for polydipsia, polyphagia and polyuria.  Genitourinary:  Negative for dysuria.  Musculoskeletal:  Positive for gait problem.  Skin:  Negative for wound.  Neurological:  Negative for dizziness, light-headedness and numbness.  Psychiatric/Behavioral:  Negative for dysphoric mood. The  patient is not nervous/anxious.     Immunization History  Administered Date(s) Administered   Fluad Quad(high Dose 65+) 05/03/2022   Influenza Split 03/19/2012, 03/19/2013   Influenza, High Dose Seasonal PF 04/09/2014, 03/22/2015, 03/24/2016, 03/29/2018, 04/16/2020   Influenza,inj,Quad PF,6+ Mos 04/09/2014, 03/22/2015, 03/24/2016   Influenza-Unspecified 03/19/2012, 03/19/2013, 04/09/2017, 03/24/2018, 04/16/2020   Moderna SARS-COV2 Booster Vaccination 05/16/2022   Moderna Sars-Covid-2 Vaccination 05/19/2021   PFIZER(Purple Top)SARS-COV-2 Vaccination 08/17/2019, 09/10/2019, 04/06/2020, 11/19/2020   Pfizer Covid-19 Vaccine Bivalent Booster 24yrs & up 11/18/2020   Pneumococcal Conjugate-13 11/05/2014   Pneumococcal Polysaccharide-23 07/10/2004, 12/12/2010, 05/02/2016   Pneumococcal-Unspecified 07/10/2004, 12/12/2010, 05/02/2016   Respiratory Syncytial Virus Vaccine,Recomb Aduvanted(Arexvy) 06/24/2022   Td 07/10/2009   Tdap 07/06/2022   Zoster Recombinat (Shingrix) 04/09/2017, 07/19/2017   Zoster, Live 07/10/2006   Pertinent  Health Maintenance Due  Topic Date Due  INFLUENZA VACCINE  02/08/2023   OPHTHALMOLOGY EXAM  03/18/2023   HEMOGLOBIN A1C  04/18/2023   FOOT EXAM  09/13/2023   DEXA SCAN  Completed      12/15/2019    4:00 PM 12/25/2019    2:02 PM 09/18/2020   12:44 PM 01/20/2022   12:54 PM 07/31/2022    9:44 AM  Fall Risk  Falls in the past year? 0 0  1 0  Was there an injury with Fall?  0  0 0  Fall Risk Category Calculator  0  1 0  Fall Risk Category (Retired)  Low  Low   (RETIRED) Patient Fall Risk Level  Low fall risk Low fall risk High fall risk   Patient at Risk for Falls Due to    History of fall(s);Impaired balance/gait;Impaired mobility History of fall(s);Impaired mobility;Impaired balance/gait  Fall risk Follow up Falls evaluation completed Falls evaluation completed;Education provided  Falls evaluation completed;Education provided;Falls prevention discussed Falls  evaluation completed   Functional Status Survey:    There were no vitals filed for this visit. There is no height or weight on file to calculate BMI. Physical Exam Vitals reviewed.  Constitutional:      General: She is not in acute distress. HENT:     Head: Normocephalic.  Eyes:     General:        Right eye: No discharge.        Left eye: No discharge.  Cardiovascular:     Rate and Rhythm: Normal rate and regular rhythm.     Pulses: Normal pulses.     Heart sounds: Normal heart sounds.  Pulmonary:     Effort: Pulmonary effort is normal.     Breath sounds: Normal breath sounds.  Abdominal:     General: Bowel sounds are normal.     Palpations: Abdomen is soft.  Musculoskeletal:     Cervical back: Neck supple.     Right lower leg: No edema.     Left lower leg: No edema.  Skin:    General: Skin is warm.     Capillary Refill: Capillary refill takes less than 2 seconds.  Neurological:     General: No focal deficit present.     Mental Status: She is alert and oriented to person, place, and time.  Psychiatric:        Mood and Affect: Mood normal.        Behavior: Behavior normal.     Labs reviewed: Recent Labs    12/26/21 0000 06/12/22 0000  NA 138 140  K 3.9 4.4  CL 108 105  CO2 21 24*  BUN 25* 25*  CREATININE 1.2* 1.2*  CALCIUM 8.8 9.4   Recent Labs    06/12/22 0000  AST 14  ALT 12  ALKPHOS 122  ALBUMIN 4.2   Recent Labs    06/12/22 0000  WBC 9.8  HGB 14.4  HCT 43  PLT 381   Lab Results  Component Value Date   TSH 3.100 08/12/2019   Lab Results  Component Value Date   HGBA1C 7.6 10/17/2022   Lab Results  Component Value Date   CHOL 191 06/12/2022   HDL 45 06/12/2022   LDLCALC 114 06/12/2022   TRIG 196 (A) 06/12/2022   CHOLHDL 4.4 12/19/2018    Significant Diagnostic Results in last 30 days:  No results found.  Assessment/Plan 1. Controlled type 2 diabetes mellitus with diabetic nephropathy, without long-term current use of  insulin (HCC) - A1c 7.6 (  04/09) - GFR now 42, was 47 - will reduce metformin and increase glipizide  - daily CBG x 2 weeks, then weekly Plan to recheck A1c and renal function in 2 months - glipiZIDE (GLUCOTROL XL) 10 MG 24 hr tablet; Take 1 tablet (10 mg total) by mouth daily with breakfast.  Dispense: 30 tablet; Refill: 0 - metFORMIN (GLUCOPHAGE) 500 MG tablet; Take 1 tablet (500 mg total) by mouth daily with breakfast AND 0.5 tablets (250 mg total) daily with supper.  Dispense: 180 tablet; Refill: 3    Family/ staff Communication: plan discussed with patient and nurse  Labs/tests ordered:  A1c and GFR in 2 months

## 2022-11-29 ENCOUNTER — Non-Acute Institutional Stay: Payer: Medicare PPO | Admitting: Family Medicine

## 2022-11-29 VITALS — BP 112/60 | HR 75 | Temp 97.8°F | Resp 18

## 2022-11-29 DIAGNOSIS — G311 Senile degeneration of brain, not elsewhere classified: Secondary | ICD-10-CM

## 2022-11-29 DIAGNOSIS — E1165 Type 2 diabetes mellitus with hyperglycemia: Secondary | ICD-10-CM

## 2022-11-29 NOTE — Progress Notes (Signed)
Therapist, nutritional Palliative Care Consult Note Telephone: [phone]  Fax: [phone]   Date of encounter: 11/29/22 2:11 PM PATIENT NAME: [name] [name] [address] [place] [address]   [phone] (home)  DOB: 03-Jul-1941 MRN: [id number] PRIMARY CARE PROVIDER:    [name], MD,  [address] [place] [address] [phone]  REFERRING PROVIDER:   [name], MD [address] [place],  [place] [address] [phone]  Emergency Contact:    Contact Information     Name Relation Home Work [place] Spouse [phone]  [phone]        I met face to face with patient in [facility]. Palliative Care was asked to follow this patient by consultation request of [name], MD to address advance care planning and complex medical decision making. This is a follow up visit.  ADVANCE CARE PLANNING/GOALS OF CARE:  Review of an existing advance directive document.  CODE STATUS: MOST as of 03/24/21: DNR/DNI with comfort measures No antibiotics, IV fluids or feeding tube.     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  Hyperglycemia with Type 2 DM without current use of insulin Facility NP recently increased Metformin to 500 mg TID with continued Glipizide 5 mg Q am. Agree with changes, goal to be between 6.5-7.0% without significant hypoglycemia.   Follow up Palliative Care Visit:  Palliative Care continuing to follow up by monitoring for changes in appetite, weight, functional and cognitive status for chronic disease progression and management in agreement with patient's stated goals of care. Next visit in 4-6 weeks or prn.  This visit was coded based on medical decision making (MDM).  Chief Complaint  Palliative Care is continuing to follow  pt for chronic medical management in setting of Sjogren's syndrome and hx of breast cancer with DJD.  HISTORY OF PRESENT ILLNESS: [name] [name] is a 82 y.o. year old female with Sjogren's syndrome and hx of breast cancer.  Staff states she had a fall yesterday but had not fallen "in a while".  Pt denies that this was a fall but she was laying on the floor to try and sleep because she couldn't get comfortable in the bed. She states she was stiff because she had been down on the floor for a while but adamantly denied a fall or any injury.  She asked "If you get my blood pressure now, does that mean I don't have to do it later?"  Denies pain, SOB, nausea/vomiting, dysuria or constipation.   ACTIVITIES OF DAILY LIVING: CONTINENT OF BLADDER? Except at night CONTINENT OF BOWEL? yes BATHING/DRESSING-requires assistance, FEEDING-independent  MOBILITY:   WHEELCHAIR  APPETITE? Good if she likes choice WEIGHT: 164.3 lbs on 10/18/22  CURRENT PROBLEM LIST:  Patient Active Problem List   Diagnosis Date Noted   Dental abscess 09/25/2022   Urinary pain 07/05/2022   Dysuria 11/12/2021   Insomnia 12/23/2018   Urge incontinence 05/09/2017   Venous stasis 05/09/2017   Peripheral polyneuropathy 05/22/2016   Controlled diabetes mellitus with diabetic nephropathy, without long-term current use of insulin (HCC) 07/29/2015   IBS (irritable bowel syndrome) 12/01/2014   Vertical diplopia 11/17/2014   Non-melanoma skin cancer 05/13/2014   Gait disorder 05/26/2012   Seasonal allergies 09/12/2011   Anxiety 09/12/2011   HTN (hypertension) 09/12/2011  Breast cancer (HCC) 09/12/2011   GERD (gastroesophageal reflux disease) 09/12/2011   Dyslipidemia 09/12/2011   Osteopenia 09/12/2011   DJD (degenerative joint disease) 09/12/2011   Factor 5 Leiden mutation, heterozygous (HCC) 09/12/2011   History of colonic polyps 09/12/2011   PAST MEDICAL HISTORY:  Active Ambulatory Problems    Diagnosis Date Noted    Seasonal allergies 09/12/2011   Anxiety 09/12/2011   HTN (hypertension) 09/12/2011   Breast cancer (HCC) 09/12/2011   GERD (gastroesophageal reflux disease) 09/12/2011   Dyslipidemia 09/12/2011   Osteopenia 09/12/2011   DJD (degenerative joint disease) 09/12/2011   Factor 5 Leiden mutation, heterozygous (HCC) 09/12/2011   History of colonic polyps 09/12/2011   Non-melanoma skin cancer 05/13/2014   Vertical diplopia 11/17/2014   IBS (irritable bowel syndrome) 12/01/2014   Controlled diabetes mellitus with diabetic nephropathy, without long-term current use of insulin (HCC) 07/29/2015   Peripheral polyneuropathy 05/22/2016   Urge incontinence 05/09/2017   Venous stasis 05/09/2017   Insomnia 12/23/2018   Dysuria 11/12/2021   Urinary pain 07/05/2022   Dental abscess 09/25/2022   Gait disorder 05/26/2012   Resolved Ambulatory Problems    Diagnosis Date Noted   Diabetes mellitus 09/12/2011   CRI (chronic renal insufficiency) 09/12/2011   Past Medical History:  Diagnosis Date   Allergy    Arthritis    Cancer (HCC)    Clotting disorder (HCC)    Diabetes mellitus    Diabetes mellitus without complication (HCC)    Hypertension    SOCIAL HX:  Social History   Tobacco Use   Smoking status: Never   Smokeless tobacco: Never  Substance Use Topics   Alcohol use: No    Alcohol/week: 0.0 standard drinks of alcohol    Comment: rare   FAMILY HX:  Family History  Problem Relation Age of Onset   Heart disease Mother        AMI as cause of death age 55.   Heart disease Father        AMI as cause of death.   Heart disease Brother        Preferred Pharmacy: ALLERGIES:  Allergies  Allergen Reactions   Fruit & Vegetable Daily [Nutritional Supplements] Diarrhea   Mixed Ragweed    Onion Other (See Comments)    indigestion   Tree Extract Swelling   Sulfa Drugs Cross Reactors Rash    All over the body     PERTINENT MEDICATIONS:  Outpatient Encounter Medications as of  11/29/2022  Medication Sig   acetaminophen (TYLENOL) 500 MG tablet Take 2 tablets (1,000 mg total) by mouth every 8 (eight) hours as needed.   albuterol (VENTOLIN HFA) 108 (90 Base) MCG/ACT inhaler Inhale 2 puffs into the lungs every 6 (six) hours as needed for wheezing or shortness of breath.   antiseptic oral rinse (BIOTENE) LIQD 30 mLs by Mouth Rinse route 2 (two) times daily. For dry mouth   aspirin 81 MG chewable tablet Chew by mouth daily.   calcium carbonate (TUMS) 500 MG chewable tablet Chew 1-2 tablets (200-400 mg of elemental calcium total) by mouth 3 (three) times daily as needed for indigestion or heartburn.   clotrimazole (GYNE-LOTRIMIN) 1 % vaginal cream Place 1 Applicatorful vaginally at bedtime.   docusate sodium (COLACE) 100 MG capsule Take 1 capsule (100 mg total) by mouth 2 (two) times daily.   fluconazole (DIFLUCAN) 100 MG tablet Take 150 mg by mouth once a week.   fluticasone (FLONASE) 50 MCG/ACT nasal spray Place 2 sprays into both  nostrils daily.   gabapentin (NEURONTIN) 100 MG capsule Take 200 mg by mouth 2 (two) times daily.   glipiZIDE (GLUCOTROL XL) 10 MG 24 hr tablet Take 1 tablet (10 mg total) by mouth daily with breakfast.   Hydrocortisone Acetate (ANUSOL-HC RE) Place 1 application  rectally as needed.   ketotifen (ZADITOR) 0.025 % ophthalmic solution Place 1 drop into both eyes every 12 (twelve) hours as needed.   lisinopril (ZESTRIL) 2.5 MG tablet Take 2.5 mg by mouth in the morning.   Melatonin 5 MG CAPS Take 1 capsule by mouth daily. As needed for insomnia   metFORMIN (GLUCOPHAGE) 500 MG tablet Take 1 tablet (500 mg total) by mouth daily with breakfast AND 0.5 tablets (250 mg total) daily with supper.   metoprolol succinate (TOPROL-XL) 100 MG 24 hr tablet Take 100 mg by mouth 2 (two) times daily. Take with or immediately following a meal.   montelukast (SINGULAIR) 10 MG tablet Take 10 mg by mouth every morning.   pravastatin (PRAVACHOL) 10 MG tablet Take 1 tablet  (10 mg total) by mouth daily.   sertraline (ZOLOFT) 50 MG tablet Take 50 mg by mouth daily.   zinc oxide 20 % ointment Apply 1 application  topically as needed (To buttocks after every incontinent epsiode and as needed for redness).   No facility-administered encounter medications on file as of 11/29/2022.    History obtained from review of EMR, discussion wi facility staff/caregiver and/or patient.   10/17/22 HGB A1c 7.6% (up from 7% in Dec.   I reviewed available labs, medications, imaging, studies and related documents from the EMR. Reviewed and summarized labs/imaging.    Physical Exam: GENERAL: NAD LUNGS: CTAB, no increased work of breathing, room air CARDIAC:  S1S2, RRR with no MRG, No edema/cyanosis ABD:  Normo-active BS x 4 quads, soft, non-tender EXTREMITIES: Normal ROM, no deformity, strength equal, No muscle atrophy/subcutaneous fat loss NEURO:  No weakness  PSYCH:  non-anxious affect, mildly irritable, A & O x 3  Thank you for the opportunity to participate in the care of Dmaya Khatib. Please call our main office at [phone] if we can be of additional assistance.    Joycelyn Man FNP-C  [facility]  Phone:  [phone]

## 2022-12-05 ENCOUNTER — Encounter: Payer: Self-pay | Admitting: Family Medicine

## 2022-12-05 DIAGNOSIS — E1165 Type 2 diabetes mellitus with hyperglycemia: Secondary | ICD-10-CM | POA: Insufficient documentation

## 2022-12-05 DIAGNOSIS — G311 Senile degeneration of brain, not elsewhere classified: Secondary | ICD-10-CM | POA: Insufficient documentation

## 2022-12-20 ENCOUNTER — Non-Acute Institutional Stay (SKILLED_NURSING_FACILITY): Payer: Medicare PPO | Admitting: Orthopedic Surgery

## 2022-12-20 ENCOUNTER — Encounter: Payer: Self-pay | Admitting: Orthopedic Surgery

## 2022-12-20 DIAGNOSIS — F03A0 Unspecified dementia, mild, without behavioral disturbance, psychotic disturbance, mood disturbance, and anxiety: Secondary | ICD-10-CM

## 2022-12-20 DIAGNOSIS — I10 Essential (primary) hypertension: Secondary | ICD-10-CM | POA: Diagnosis not present

## 2022-12-20 DIAGNOSIS — R309 Painful micturition, unspecified: Secondary | ICD-10-CM

## 2022-12-20 DIAGNOSIS — J3089 Other allergic rhinitis: Secondary | ICD-10-CM | POA: Diagnosis not present

## 2022-12-20 DIAGNOSIS — D692 Other nonthrombocytopenic purpura: Secondary | ICD-10-CM

## 2022-12-20 DIAGNOSIS — G629 Polyneuropathy, unspecified: Secondary | ICD-10-CM

## 2022-12-20 DIAGNOSIS — E1121 Type 2 diabetes mellitus with diabetic nephropathy: Secondary | ICD-10-CM | POA: Diagnosis not present

## 2022-12-20 DIAGNOSIS — E1169 Type 2 diabetes mellitus with other specified complication: Secondary | ICD-10-CM

## 2022-12-20 DIAGNOSIS — E785 Hyperlipidemia, unspecified: Secondary | ICD-10-CM

## 2022-12-20 DIAGNOSIS — F339 Major depressive disorder, recurrent, unspecified: Secondary | ICD-10-CM

## 2022-12-20 DIAGNOSIS — N1832 Chronic kidney disease, stage 3b: Secondary | ICD-10-CM

## 2022-12-20 MED ORDER — CETIRIZINE HCL 10 MG PO TABS: 10.0000 mg | ORAL_TABLET | Freq: Every day | ORAL | 0 refills | Status: DC

## 2022-12-20 NOTE — Progress Notes (Addendum)
Location:  [facility] Room Number: 26/A Place of Service:  SNF [phone]) Provider:  Octavia Heir, NP   [name], MD  Patient Care Team: [name], MD as PCP - General (Internal Medicine) Ernesto Rutherford, MD (Ophthalmology) Claud Kelp, MD as Consulting Physician (General Surgery) Carlus Pavlov, MD as Consulting Physician (Internal Medicine)  Extended Emergency Contact Information Primary Emergency Contact: [name],[name] Address: [address]          [place] [address] [place] Home Phone: [phone] Mobile Phone: [phone] Relation: Spouse  Code Status:  DNR Goals of care: Advanced Directive information    11/09/2022   11:45 AM  Advanced Directives  Does Patient Have a Medical Advance Directive? Yes  Type of Estate agent of [place];Living will;Out of facility DNR (pink MOST or yellow form)  Does patient want to make changes to medical advance directive? No - Patient declined  Copy of Healthcare Power of Attorney in Chart? Yes - validated most recent copy scanned in chart (See row information)  Pre-existing out of facility DNR order (yellow form or pink MOST form) Yellow form placed in chart (order not valid for inpatient use)     Chief Complaint  Patient presents with   Medical Management of Chronic Issues    HPI:  Pt is a 82 y.o. female seen today for medical management of chronic diseases.   She currently resides on the skilled nursing unit at [facility] [place] [facility]. PMH: HTN, HLD, GERD, IBS, T2DM, polyneuropathy, DJD, osteopenia, Factor 5 mutation, breast cancer s/p  left lumpectomy 1996, incontinence, and anxiety.   Followed by palliative. Hospice discharged last year.     T2DM- A1c 7.6 (04/09)> was 7.0 (12/04)>, urine microalbumin 151 12/13/2021, no hypoglycemic events, blood sugars 140-160's, metformin recently reduced/ glipizide increased due to declining renal function, off Invokana due to  frequent genitourinary infections, last eye exam 03/2022 Dementia-  MMSE 25/30 2021, BIMS score 15/15 (03/21)> was 14 /15 (12/19), diagnosed with neurodegenerative disorder per neurology, unable to ambulate, no behaviors, not on medication HTN- BUN/creat 21/1.28, GFR 42 (05/16) , remains on metoprolol and lisinopril HLD- on pravastatin CKD- see above Peripheral neuropathy- remains on gabapentin Painful micturition- increased pain when urinating, 09/06 urine culture negative for growth, CT abdomen/pelvis negative for hydronephrosis, renal calculi, neoplasm, improved with scheduled Diflucan Depression- Na+ 139 11/23/2022,remains on Zoloft   Recent blood pressures:  06/11- 152/84  06/05- 138/76  06/04- 142/78  Recent weights:  06/01- 171 lbs  04/10- 164.3 lbs  03/01- 164.3 lbs      Past Medical History:  Diagnosis Date   Allergy    [name]; allergy shots weekly.     Anxiety    Arthritis    DDD lumbar spine.     Cancer (HCC)    breast L x 2; skin basal cell carcinoma [name]).   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   Past Surgical History:  Procedure Laterality Date   BREAST LUMPECTOMY  1996   L breast cancer   BREAST SURGERY N/A    [employer]reesia 12/12/2019   EYE SURGERY N/A    [employer]reesia 12/12/2019   MASTECTOMY  2002   Bilateral for breast cancer   VAGINAL HYSTERECTOMY  1985  DUB; uterine fibroids; ovaries intact.    Allergies  Allergen Reactions   Fruit & Vegetable Daily [Nutritional Supplements] Diarrhea   Mixed Ragweed    Onion Other (See Comments)    indigestion   Tree Extract Swelling   Sulfa Drugs Cross Reactors Rash    All over the body    Outpatient Encounter Medications as of 12/20/2022  Medication Sig   acetaminophen (TYLENOL) 500 MG tablet Take 2  tablets (1,000 mg total) by mouth every 8 (eight) hours as needed.   albuterol (VENTOLIN HFA) 108 (90 Base) MCG/ACT inhaler Inhale 2 puffs into the lungs every 6 (six) hours as needed for wheezing or shortness of breath.   antiseptic oral rinse (BIOTENE) LIQD 30 mLs by Mouth Rinse route 2 (two) times daily. For dry mouth   aspirin 81 MG chewable tablet Chew by mouth daily.   calcium carbonate (TUMS) 500 MG chewable tablet Chew 1-2 tablets (200-400 mg of elemental calcium total) by mouth 3 (three) times daily as needed for indigestion or heartburn.   clotrimazole (GYNE-LOTRIMIN) 1 % vaginal cream Place 1 Applicatorful vaginally at bedtime.   docusate sodium (COLACE) 100 MG capsule Take 1 capsule (100 mg total) by mouth 2 (two) times daily.   fluconazole (DIFLUCAN) 100 MG tablet Take 150 mg by mouth once a week.   fluticasone (FLONASE) 50 MCG/ACT nasal spray Place 2 sprays into both nostrils daily.   gabapentin (NEURONTIN) 100 MG capsule Take 200 mg by mouth 2 (two) times daily.   glipiZIDE (GLUCOTROL XL) 10 MG 24 hr tablet Take 1 tablet (10 mg total) by mouth daily with breakfast. (Patient taking differently: Take 5 mg by mouth daily with breakfast.)   Hydrocortisone Acetate (ANUSOL-HC RE) Place 1 application  rectally as needed.   ketotifen (ZADITOR) 0.025 % ophthalmic solution Place 1 drop into both eyes every 12 (twelve) hours as needed.   lisinopril (ZESTRIL) 2.5 MG tablet Take 2.5 mg by mouth in the morning.   Melatonin 5 MG CAPS Take 1 capsule by mouth daily. As needed for insomnia   metFORMIN (GLUCOPHAGE) 500 MG tablet Take 1 tablet (500 mg total) by mouth daily with breakfast AND 0.5 tablets (250 mg total) daily with supper. (Patient taking differently: Take 1 tablet by mouth 3 times daily with meals)   metoprolol succinate (TOPROL-XL) 100 MG 24 hr tablet Take 100 mg by mouth 2 (two) times daily. Take with or immediately following a meal.   montelukast (SINGULAIR) 10 MG tablet Take 10 mg by  mouth every morning.   pravastatin (PRAVACHOL) 10 MG tablet Take 1 tablet (10 mg total) by mouth daily.   sertraline (ZOLOFT) 50 MG tablet Take 50 mg by mouth daily.   zinc oxide 20 % ointment Apply 1 application  topically as needed (To buttocks after every incontinent epsiode and as needed for redness).   No facility-administered encounter medications on file as of 12/20/2022.    Review of Systems  Constitutional:  Negative for activity change and appetite change.  HENT:  Negative for dental problem and trouble swallowing.   Eyes:  Negative for visual disturbance.  Respiratory:  Negative for cough, shortness of breath and wheezing.   Cardiovascular:  Negative for chest pain and leg swelling.  Gastrointestinal:  Negative for abdominal distention and abdominal pain.  Endocrine: Negative for polydipsia, polyphagia and polyuria.  Genitourinary:  Positive for dysuria. Negative for frequency, hematuria and vaginal bleeding.  Musculoskeletal:  Positive for gait problem.  Skin:  Positive for color  change.  Allergic/Immunologic: Positive for environmental allergies.  Neurological:  Positive for weakness. Negative for dizziness and headaches.  Psychiatric/Behavioral:  Positive for confusion. Negative for dysphoric mood and sleep disturbance. The patient is not nervous/anxious.     Immunization History  Administered Date(s) Administered   Fluad Quad(high Dose 65+) 05/03/2022   Influenza Split 03/19/2012, 03/19/2013   Influenza, High Dose Seasonal PF 04/09/2014, 03/22/2015, 03/24/2016, 03/29/2018, 04/16/2020   Influenza,inj,Quad PF,6+ Mos 04/09/2014, 03/22/2015, 03/24/2016   Influenza-Unspecified 03/19/2012, 03/19/2013, 04/09/2017, 03/24/2018, 04/16/2020   Moderna SARS-COV2 Booster Vaccination 05/16/2022   Moderna Sars-Covid-2 Vaccination 05/19/2021   PFIZER(Purple Top)SARS-COV-2 Vaccination 08/17/2019, 09/10/2019, 04/06/2020, 11/19/2020   Pfizer Covid-19 Vaccine Bivalent Booster 76yrs & up  11/18/2020   Pneumococcal Conjugate-13 11/05/2014   Pneumococcal Polysaccharide-23 07/10/2004, 12/12/2010, 05/02/2016   Pneumococcal-Unspecified 07/10/2004, 12/12/2010, 05/02/2016   Respiratory Syncytial Virus Vaccine,Recomb Aduvanted(Arexvy) 06/24/2022   Td 07/10/2009   Tdap 07/06/2022   Zoster Recombinat (Shingrix) 04/09/2017, 07/19/2017   Zoster, Live 07/10/2006   Pertinent  Health Maintenance Due  Topic Date Due   INFLUENZA VACCINE  02/08/2023   OPHTHALMOLOGY EXAM  03/18/2023   HEMOGLOBIN A1C  04/18/2023   FOOT EXAM  09/13/2023   DEXA SCAN  Completed      12/15/2019    4:00 PM 12/25/2019    2:02 PM 09/18/2020   12:44 PM 01/20/2022   12:54 PM 07/31/2022    9:44 AM  Fall Risk  Falls in the past year? 0 0  1 0  Was there an injury with Fall?  0  0 0  Fall Risk Category Calculator  0  1 0  Fall Risk Category (Retired)  Low  Low   (RETIRED) Patient Fall Risk Level  Low fall risk Low fall risk High fall risk   Patient at Risk for Falls Due to    History of fall(s);Impaired balance/gait;Impaired mobility History of fall(s);Impaired mobility;Impaired balance/gait  Fall risk Follow up Falls evaluation completed Falls evaluation completed;Education provided  Falls evaluation completed;Education provided;Falls prevention discussed Falls evaluation completed   Functional Status Survey:    Vitals:   12/20/22 1551 12/20/22 1552  BP: (!) 152/84 (!) 144/81  Pulse: 70   Resp: 18   Temp: 97.6 F (36.4 C)   SpO2: 94%   Weight: 171 lb (77.6 kg)   Height: 5\' 4"  (1.626 m)    Body mass index is 29.35 kg/m. Physical Exam Vitals reviewed.  Constitutional:      General: She is not in acute distress. HENT:     Head: Normocephalic.     Right Ear: There is no impacted cerumen.     Left Ear: There is no impacted cerumen.     Nose: Rhinorrhea present.     Mouth/Throat:     Mouth: Mucous membranes are moist.     Comments: Missing teeth Eyes:     General:        Right eye: No discharge.         Left eye: No discharge.  Cardiovascular:     Rate and Rhythm: Normal rate and regular rhythm.     Pulses: Normal pulses.     Heart sounds: Normal heart sounds.  Pulmonary:     Effort: Pulmonary effort is normal. No respiratory distress.     Breath sounds: Normal breath sounds. No wheezing.  Abdominal:     General: Bowel sounds are normal. There is no distension.     Palpations: Abdomen is soft.     Tenderness: There is no abdominal  tenderness.  Musculoskeletal:     Cervical back: Neck supple.     Right lower leg: No edema.     Left lower leg: No edema.  Skin:    General: Skin is warm.     Capillary Refill: Capillary refill takes less than 2 seconds.     Comments: Nontender purple lesion to left anterior forearm, no skin breakdown  Neurological:     General: No focal deficit present.     Mental Status: She is alert and oriented to person, place, and time.     Motor: Weakness present.     Gait: Gait abnormal.     Comments: wheelchair  Psychiatric:        Mood and Affect: Mood normal.     Labs reviewed: Recent Labs    12/26/21 0000 06/12/22 0000  NA 138 140  K 3.9 4.4  CL 108 105  CO2 21 24*  BUN 25* 25*  CREATININE 1.2* 1.2*  CALCIUM 8.8 9.4   Recent Labs    06/12/22 0000  AST 14  ALT 12  ALKPHOS 122  ALBUMIN 4.2   Recent Labs    06/12/22 0000  WBC 9.8  HGB 14.4  HCT 43  PLT 381   Lab Results  Component Value Date   TSH 3.100 08/12/2019   Lab Results  Component Value Date   HGBA1C 7.6 10/17/2022   Lab Results  Component Value Date   CHOL 191 06/12/2022   HDL 45 06/12/2022   LDLCALC 114 06/12/2022   TRIG 196 (A) 06/12/2022   CHOLHDL 4.4 12/19/2018    Significant Diagnostic Results in last 30 days:  No results found.  Assessment/Plan 1. Environmental and seasonal allergies - rhinorrhea present - start Zyrtec 10 mg po qhs x 1 month - cont Flonase and Singulair  2. Controlled type 2 diabetes mellitus with diabetic nephropathy,  without long-term current use of insulin (HCC) - A1c 7.6 04/09 - declining GFR> now 42 - metformin decreased, glipizide increased due to declining CKD  3. Mild dementia without behavioral disturbance, psychotic disturbance, mood disturbance, or anxiety, unspecified dementia type (HCC) - no behaviors - appropriate most of time - not on medication  4. Primary hypertension - controlled - cont lisinopril   5. Hyperlipidemia associated with type 2 diabetes mellitus (HCC) - cont pravastatin  6. Stage 3b chronic kidney disease (HCC) - encourage hydration with water - avoid NSAIDS  7. Peripheral polyneuropathy - stable with gabapentin  8. Painful micturition - occurring less often  - followed by urology  9. Recurrent depression (HCC) - no mood changes - cont zoloft  10. 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

## 2023-01-10 ENCOUNTER — Encounter: Payer: Self-pay | Admitting: Internal Medicine

## 2023-01-10 ENCOUNTER — Non-Acute Institutional Stay (SKILLED_NURSING_FACILITY): Payer: Medicare PPO | Admitting: Internal Medicine

## 2023-01-10 DIAGNOSIS — R309 Painful micturition, unspecified: Secondary | ICD-10-CM

## 2023-01-10 DIAGNOSIS — F03A0 Unspecified dementia, mild, without behavioral disturbance, psychotic disturbance, mood disturbance, and anxiety: Secondary | ICD-10-CM

## 2023-01-10 DIAGNOSIS — E1121 Type 2 diabetes mellitus with diabetic nephropathy: Secondary | ICD-10-CM

## 2023-01-10 DIAGNOSIS — N1832 Chronic kidney disease, stage 3b: Secondary | ICD-10-CM

## 2023-01-10 DIAGNOSIS — F339 Major depressive disorder, recurrent, unspecified: Secondary | ICD-10-CM

## 2023-01-10 DIAGNOSIS — G629 Polyneuropathy, unspecified: Secondary | ICD-10-CM

## 2023-01-10 DIAGNOSIS — E785 Hyperlipidemia, unspecified: Secondary | ICD-10-CM

## 2023-01-10 DIAGNOSIS — E1169 Type 2 diabetes mellitus with other specified complication: Secondary | ICD-10-CM | POA: Diagnosis not present

## 2023-01-10 DIAGNOSIS — I10 Essential (primary) hypertension: Secondary | ICD-10-CM

## 2023-01-10 DIAGNOSIS — B3731 Acute candidiasis of vulva and vagina: Secondary | ICD-10-CM

## 2023-01-10 NOTE — Progress Notes (Deleted)
Location:  [facility] Room Number: 26A Place of Service:  SNF [phone]) Provider:  [name], MD   [name], MD  Patient Care Team: [name], MD as PCP - General (Internal Medicine) Ernesto Rutherford, MD (Ophthalmology) Claud Kelp, MD as Consulting Physician (General Surgery) Carlus Pavlov, MD as Consulting Physician (Internal Medicine)  Extended Emergency Contact Information Primary Emergency Contact: [name],[name] Address: [address]          [place] [address] [place] Home Phone: [phone] Mobile Phone: [phone] Relation: Spouse  Code Status:  DNR Goals of care: Advanced Directive information    01/10/2023    3:49 PM  Advanced Directives  Does Patient Have a Medical Advance Directive? Yes  Type of Advance Directive Living will;Out of facility DNR (pink MOST or yellow form)  Does patient want to make changes to medical advance directive? No - Patient declined     Chief Complaint  Patient presents with   Medical Management of Chronic Issues    Patient is being seen for routine visit    Immunizations    Discussed the need for covid vaccine   Quality Metric Gaps    Discuss the need for AWV    HPI:  Pt is a 82 y.o. female seen today for medical management of chronic diseases.     Past Medical History:  Diagnosis Date   Allergy    [name]; allergy shots weekly.     Anxiety    Arthritis    DDD lumbar spine.     Cancer (HCC)    breast L x 2; skin basal cell carcinoma [name]).   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   Past Surgical History:  Procedure Laterality Date   BREAST LUMPECTOMY  1996   L breast cancer   BREAST SURGERY N/A    Phreesia 12/12/2019   EYE SURGERY N/A    Phreesia  12/12/2019   MASTECTOMY  2002   Bilateral for breast cancer   VAGINAL HYSTERECTOMY  1985   DUB; uterine fibroids; ovaries intact.    Allergies  Allergen Reactions   Fruit & Vegetable Daily [Nutritional Supplements] Diarrhea   Mixed Ragweed    Onion Other (See Comments)    indigestion   Tree Extract Swelling   Sulfa Drugs Cross Reactors Rash    All over the body    Outpatient Encounter Medications as of 01/10/2023  Medication Sig   acetaminophen (TYLENOL) 500 MG tablet Take 2 tablets (1,000 mg total) by mouth every 8 (eight) hours as needed.   albuterol (VENTOLIN HFA) 108 (90 Base) MCG/ACT inhaler Inhale 2 puffs into the lungs every 6 (six) hours as needed for wheezing or shortness of breath.   antiseptic oral rinse (BIOTENE) LIQD 30 mLs by Mouth Rinse route 2 (two) times daily. For dry mouth   aspirin 81 MG chewable tablet Chew by mouth daily.   calcium carbonate (TUMS) 500 MG chewable tablet Chew 1-2 tablets (200-400 mg of elemental calcium total) by mouth 3 (three) times daily as needed for indigestion or heartburn.   cetirizine (ZYRTEC) 10 MG tablet Take 1 tablet (10 mg total) by mouth daily.   clotrimazole (GYNE-LOTRIMIN) 1 % vaginal cream Place 1 Applicatorful vaginally at  bedtime.   docusate sodium (COLACE) 100 MG capsule Take 1 capsule (100 mg total) by mouth 2 (two) times daily.   fluconazole (DIFLUCAN) 100 MG tablet Take 150 mg by mouth once a week.   fluticasone (FLONASE) 50 MCG/ACT nasal spray Place 2 sprays into both nostrils daily.   gabapentin (NEURONTIN) 100 MG capsule Take 200 mg by mouth 2 (two) times daily.   glipiZIDE (GLUCOTROL XL) 10 MG 24 hr tablet Take 1 tablet (10 mg total) by mouth daily with breakfast. (Patient taking differently: Take 5 mg by mouth daily with breakfast.)   Hydrocortisone Acetate (ANUSOL-HC RE) Place 1 application  rectally as needed.   loperamide (IMODIUM A-D) 2 MG tablet Take 2 mg by mouth every 12 (twelve) hours as needed for diarrhea or  loose stools.   Melatonin 5 MG CAPS Take 1 capsule by mouth daily. As needed for insomnia   metFORMIN (GLUCOPHAGE) 500 MG tablet Take 1 tablet (500 mg total) by mouth daily with breakfast AND 0.5 tablets (250 mg total) daily with supper. (Patient taking differently: Take 1 tablet by mouth 3 times daily with meals)   metoprolol succinate (TOPROL-XL) 100 MG 24 hr tablet Take 100 mg by mouth 2 (two) times daily. Take with or immediately following a meal.   montelukast (SINGULAIR) 10 MG tablet Take 10 mg by mouth every morning.   pravastatin (PRAVACHOL) 10 MG tablet Take 1 tablet (10 mg total) by mouth daily.   sertraline (ZOLOFT) 50 MG tablet Take 50 mg by mouth daily.   zinc oxide 20 % ointment Apply 1 application  topically as needed (To buttocks after every incontinent epsiode and as needed for redness).   ketotifen (ZADITOR) 0.025 % ophthalmic solution Place 1 drop into both eyes every 12 (twelve) hours as needed. (Patient not taking: Reported on 01/10/2023)   lisinopril (ZESTRIL) 2.5 MG tablet Take 2.5 mg by mouth in the morning. (Patient not taking: Reported on 01/10/2023)   No facility-administered encounter medications on file as of 01/10/2023.    Review of Systems  Immunization History  Administered Date(s) Administered   Fluad Quad(high Dose 65+) 05/03/2022   Influenza Split 03/19/2012, 03/19/2013   Influenza, High Dose Seasonal PF 04/09/2014, 03/22/2015, 03/24/2016, 03/29/2018, 04/16/2020   Influenza,inj,Quad PF,6+ Mos 04/09/2014, 03/22/2015, 03/24/2016   Influenza-Unspecified 03/19/2012, 03/19/2013, 04/09/2017, 03/24/2018, 04/16/2020   Moderna SARS-COV2 Booster Vaccination 05/16/2022   Moderna Sars-Covid-2 Vaccination 05/19/2021   PFIZER(Purple Top)SARS-COV-2 Vaccination 08/17/2019, 09/10/2019, 04/06/2020, 11/19/2020   Pfizer Covid-19 Vaccine Bivalent Booster 20yrs & up 11/18/2020   Pneumococcal Conjugate-13 11/05/2014   Pneumococcal Polysaccharide-23 07/10/2004, 12/12/2010, 05/02/2016    Pneumococcal-Unspecified 07/10/2004, 12/12/2010, 05/02/2016   Respiratory Syncytial Virus Vaccine,Recomb Aduvanted(Arexvy) 06/24/2022   Td 07/10/2009   Tdap 07/06/2022   Zoster Recombinant(Shingrix) 04/09/2017, 07/19/2017   Zoster, Live 07/10/2006   Pertinent  Health Maintenance Due  Topic Date Due   INFLUENZA VACCINE  02/08/2023   OPHTHALMOLOGY EXAM  03/18/2023   HEMOGLOBIN A1C  04/18/2023   FOOT EXAM  09/13/2023   DEXA SCAN  Completed      12/25/2019    2:02 PM 09/18/2020   12:44 PM 01/20/2022   12:54 PM 07/31/2022    9:44 AM 01/10/2023    3:48 PM  Fall Risk  Falls in the past year? 0  1 0 0  Was there an injury with Fall? 0  0 0 0  Fall Risk Category Calculator 0  1 0 0  Fall Risk Category (Retired) Low  Low    (RETIRED)  Patient Fall Risk Level Low fall risk Low fall risk High fall risk    Patient at Risk for Falls Due to   History of fall(s);Impaired balance/gait;Impaired mobility History of fall(s);Impaired mobility;Impaired balance/gait No Fall Risks  Fall risk Follow up Falls evaluation completed;Education provided  Falls evaluation completed;Education provided;Falls prevention discussed Falls evaluation completed Falls evaluation completed   Functional Status Survey:    Vitals:   01/10/23 1547  BP: 124/82  Pulse: 69  Resp: 18  Temp: 97.9 F (36.6 C)  TempSrc: Temporal  SpO2: 93%  Weight: 167 lb 8 oz (76 kg)  Height: 5\' 4"  (1.626 m)   Body mass index is 28.75 kg/m. Physical Exam  Labs reviewed: Recent Labs    06/12/22 0000  NA 140  K 4.4  CL 105  CO2 24*  BUN 25*  CREATININE 1.2*  CALCIUM 9.4   Recent Labs    06/12/22 0000  AST 14  ALT 12  ALKPHOS 122  ALBUMIN 4.2   Recent Labs    06/12/22 0000  WBC 9.8  HGB 14.4  HCT 43  PLT 381   Lab Results  Component Value Date   TSH 3.100 08/12/2019   Lab Results  Component Value Date   HGBA1C 7.6 10/17/2022   Lab Results  Component Value Date   CHOL 191 06/12/2022   HDL 45 06/12/2022    LDLCALC 114 06/12/2022   TRIG 196 (A) 06/12/2022   CHOLHDL 4.4 12/19/2018    Significant Diagnostic Results in last 30 days:  No results found.  Assessment/Plan There are no diagnoses linked to this encounter.   Family/ staff Communication: ***  Labs/tests ordered:  ***

## 2023-01-10 NOTE — Progress Notes (Signed)
Location:  [facility] Room Number: 26A Place of Service:  SNF (31)  Provider:   Code Status: DNR Goals of Care:     01/10/2023    3:49 PM  Advanced Directives  Does Patient Have a Medical Advance Directive? Yes  Type of Advance Directive Living will;Out of facility DNR (pink MOST or yellow form)  Does patient want to make changes to medical advance directive? No - Patient declined     Chief Complaint  Patient presents with   Medical Management of Chronic Issues    Patient is being seen for routine visit    Immunizations    Discussed the need for covid vaccine   Quality Metric Gaps    Discuss the need for AWV    HPI: Patient is a 82 y.o. female seen today for medical management of chronic diseases.   L[facility] in SNF in [facility]  Patient has a history of type 2 diabetes History of neurodegenerative dementia Seen Neurology in 03/21 MMSE 25/30 MRI Moderate atrophy. Mild progression of white matter changes most likely due to chronic microvascular ischemia No workup since then  Urinary Incontinence HTN Insomnia, And Neuropathy Chronic Candidiasis CT abdomen/pelvis negative for hydronephrosis, renal calculi, neoplasm     She is stable. No new Nursing issues. No Behavior issues Her weight is stable Patient told me that she is having dysuria again.  That was her chronic issue in the past.  Most of her symptoms had been resolved with using chronic Diflucan. But today she said her symptoms have back .  It is hard to get detailed history because of her aphasia and cognitive impairment Patient is mostly wheelchair based. No Falls Wt Readings from Last 3 Encounters:  01/10/23 167 lb 8 oz (76 kg)  12/20/22 171 lb (77.6 kg)  11/24/22 164 lb 4.8 oz (74.5 kg)   Past Medical History:  Diagnosis Date   Allergy    [name]; allergy shots weekly.     Anxiety    Arthritis    DDD lumbar spine.     Cancer (HCC)    breast L x 2; skin basal cell carcinoma  [name]).   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    Past Surgical History:  Procedure Laterality Date   BREAST LUMPECTOMY  1996   L breast cancer   BREAST SURGERY N/A    Phreesia 12/12/2019   EYE SURGERY N/A    Phreesia 12/12/2019   MASTECTOMY  2002   Bilateral for breast cancer   VAGINAL HYSTERECTOMY  1985   DUB; uterine fibroids; ovaries intact.    Allergies  Allergen Reactions   Fruit & Vegetable Daily [Nutritional Supplements] Diarrhea   Mixed Ragweed    Onion Other (See Comments)    indigestion   Tree Extract Swelling   Sulfa Drugs Cross Reactors Rash    All over the body    Outpatient Encounter Medications as of 01/10/2023  Medication Sig   acetaminophen (TYLENOL) 500 MG tablet Take 2 tablets (1,000 mg total) by mouth every 8 (eight) hours as needed.   albuterol (VENTOLIN HFA) 108 (90 Base) MCG/ACT inhaler Inhale 2 puffs into the lungs every 6 (six) hours as needed for wheezing or shortness of breath.   antiseptic oral  rinse (BIOTENE) LIQD 30 mLs by Mouth Rinse route 2 (two) times daily. For dry mouth   aspirin 81 MG chewable tablet Chew by mouth daily.   calcium carbonate (TUMS) 500 MG chewable tablet Chew 1-2 tablets (200-400 mg of elemental calcium total) by mouth 3 (three) times daily as needed for indigestion or heartburn.   cetirizine (ZYRTEC) 10 MG tablet Take 1 tablet (10 mg total) by mouth daily.   clotrimazole (GYNE-LOTRIMIN) 1 % vaginal cream Place 1 Applicatorful vaginally at bedtime.   docusate sodium (COLACE) 100 MG capsule Take 1 capsule (100 mg total) by mouth 2 (two) times daily.   fluconazole (DIFLUCAN) 100 MG tablet Take 150 mg by mouth once a week.   fluticasone (FLONASE) 50 MCG/ACT nasal spray Place 2 sprays into both nostrils daily.   gabapentin  (NEURONTIN) 100 MG capsule Take 200 mg by mouth 2 (two) times daily.   glipiZIDE (GLUCOTROL XL) 10 MG 24 hr tablet Take 1 tablet (10 mg total) by mouth daily with breakfast.   Hydrocortisone Acetate (ANUSOL-HC RE) Place 1 application  rectally as needed.   lisinopril (ZESTRIL) 2.5 MG tablet Take 2.5 mg by mouth in the morning.   loperamide (IMODIUM A-D) 2 MG tablet Take 2 mg by mouth every 12 (twelve) hours as needed for diarrhea or loose stools.   Melatonin 5 MG CAPS Take 1 capsule by mouth daily. As needed for insomnia   metFORMIN (GLUCOPHAGE) 500 MG tablet Take 1 tablet (500 mg total) by mouth daily with breakfast AND 0.5 tablets (250 mg total) daily with supper.   metoprolol succinate (TOPROL-XL) 100 MG 24 hr tablet Take 100 mg by mouth 2 (two) times daily. Take with or immediately following a meal.   montelukast (SINGULAIR) 10 MG tablet Take 10 mg by mouth every morning.   pravastatin (PRAVACHOL) 10 MG tablet Take 1 tablet (10 mg total) by mouth daily.   sertraline (ZOLOFT) 50 MG tablet Take 50 mg by mouth daily.   zinc oxide 20 % ointment Apply 1 application  topically as needed (To buttocks after every incontinent epsiode and as needed for redness).   [DISCONTINUED] ketotifen (ZADITOR) 0.025 % ophthalmic solution Place 1 drop into both eyes every 12 (twelve) hours as needed. (Patient not taking: Reported on 01/10/2023)   No facility-administered encounter medications on file as of 01/10/2023.    Review of Systems:  Review of Systems  Constitutional:  Negative for activity change and appetite change.  HENT: Negative.    Respiratory:  Negative for cough and shortness of breath.   Cardiovascular:  Negative for leg swelling.  Gastrointestinal:  Negative for constipation.  Genitourinary:  Positive for dysuria.  Musculoskeletal:  Positive for gait problem. Negative for arthralgias and myalgias.  Skin: Negative.   Neurological:  Negative for dizziness and weakness.  Psychiatric/Behavioral:   Positive for confusion and dysphoric mood. Negative for sleep disturbance.     Health Maintenance  Topic Date Due   COVID-19 Vaccine (7 - 2023-24 season) 07/11/2022   Medicare Annual Wellness (AWV)  01/21/2023   Diabetic kidney evaluation - Urine ACR  08/01/2027 (Originally 05/17/2019)   INFLUENZA VACCINE  02/08/2023   OPHTHALMOLOGY EXAM  03/18/2023   HEMOGLOBIN A1C  05/26/2023   FOOT EXAM  09/13/2023   Diabetic kidney evaluation - eGFR measurement  11/23/2023   DTaP/Tdap/Td (3 - Td or Tdap) 07/06/2032   Pneumonia Vaccine 57+ Years old  Completed   DEXA SCAN  Completed   Zoster Vaccines- Shingrix  Completed  HPV VACCINES  Aged Out    Physical Exam: Vitals:   01/10/23 1547  BP: 124/82  Pulse: 69  Resp: 18  Temp: 97.9 F (36.6 C)  TempSrc: Temporal  SpO2: 93%  Weight: 167 lb 8 oz (76 kg)  Height: 5\' 4"  (1.626 m)   Body mass index is 28.75 kg/m. Physical Exam Vitals reviewed.  Constitutional:      Appearance: Normal appearance.  HENT:     Head: Normocephalic.     Nose: Nose normal.     Mouth/Throat:     Mouth: Mucous membranes are moist.     Pharynx: Oropharynx is clear.  Eyes:     Pupils: Pupils are equal, round, and reactive to light.  Cardiovascular:     Rate and Rhythm: Normal rate and regular rhythm.     Pulses: Normal pulses.     Heart sounds: Normal heart sounds. No murmur heard. Pulmonary:     Effort: Pulmonary effort is normal.     Breath sounds: Normal breath sounds.  Abdominal:     General: Abdomen is flat. Bowel sounds are normal.     Palpations: Abdomen is soft.  Musculoskeletal:        General: No swelling.     Cervical back: Neck supple.  Skin:    General: Skin is warm.  Neurological:     Mental Status: She is alert.     Comments: She has aphasia and also Oriented to herself  Psychiatric:        Mood and Affect: Mood normal.        Thought Content: Thought content normal.     Labs reviewed: Basic Metabolic Panel: Recent Labs     06/12/22 0000 11/23/22 0000  NA 140 139  K 4.4 4.5  CL 105 107  CO2 24* 22  BUN 25* 21  CREATININE 1.2* 1.3*  CALCIUM 9.4 8.8   Liver Function Tests: Recent Labs    06/12/22 0000  AST 14  ALT 12  ALKPHOS 122  ALBUMIN 4.2   No results for input(s): "LIPASE", "AMYLASE" in the last 8760 hours. No results for input(s): "AMMONIA" in the last 8760 hours. CBC: Recent Labs    06/12/22 0000 11/23/22 0000  WBC 9.8 9.2  NEUTROABS  --  6,017.00  HGB 14.4 12.0  HCT 43 36  PLT 381 323   Lipid Panel: Recent Labs    06/12/22 0000  CHOL 191  HDL 45  LDLCALC 114  TRIG 161*   Lab Results  Component Value Date   HGBA1C 7.2 11/23/2022    Procedures since last visit: No results found.  Assessment/Plan 1. Controlled type 2 diabetes mellitus with diabetic nephropathy, without long-term current use of insulin (HCC) A1C is 7.2 On Amaryl and Metformin CBGS are in good control 150-200 EYE exam 09/23  2. Painful micturition Will Check UA again Past work up with Urology was negative CT Scan negative They recommended Conservative management  3. Stage 3b chronic kidney disease (HCC) Creat stable  4. Hyperlipidemia associated with type 2 diabetes mellitus (HCC) On statin LDL pending on statin  5. Primary hypertension On Lisinopril and Metoprolol  6. Peripheral polyneuropathy Gabapentin  7. Recurrent depression (HCC) Sertraline  8. Vaginal candidiasis Chronic Diflucan  9. Mild dementia without behavioral disturbance, psychotic disturbance, mood disturbance, or anxiety, unspecified dementia type (HCC) Needs SNF level Care  Labs/tests ordered:  * No order type specified * Next appt:  Visit date not found

## 2023-01-22 ENCOUNTER — Encounter: Payer: Self-pay | Admitting: Orthopedic Surgery

## 2023-01-22 ENCOUNTER — Non-Acute Institutional Stay (SKILLED_NURSING_FACILITY): Payer: Medicare PPO | Admitting: Orthopedic Surgery

## 2023-01-22 DIAGNOSIS — K644 Residual hemorrhoidal skin tags: Secondary | ICD-10-CM

## 2023-01-22 MED ORDER — SENNA 8.6 MG PO TABS
2.0000 | ORAL_TABLET | Freq: Every day | ORAL | Status: DC
Start: 2023-01-22 — End: 2023-05-01

## 2023-01-22 MED ORDER — HYDROCORTISONE (PERIANAL) 2.5 % EX CREA
1.0000 | TOPICAL_CREAM | Freq: Two times a day (BID) | CUTANEOUS | Status: AC
Start: 2023-01-22 — End: 2023-01-29

## 2023-01-22 NOTE — Progress Notes (Signed)
Location:   [facility]  Nursing Home Room Number: 26-A Place of Service:  SNF [phone]) Provider:  Hazle Nordmann, NP  PCP: [name], MD  Patient Care Team: [name], MD as PCP - General (Internal Medicine) Ernesto Rutherford, MD (Ophthalmology) Claud Kelp, MD as Consulting Physician (General Surgery) Carlus Pavlov, MD as Consulting Physician (Internal Medicine)  Extended Emergency Contact Information Primary Emergency Contact: [name],[name] Address: [address]          [place] [address] [place] Home Phone: [phone] Mobile Phone: [phone] Relation: Spouse  Code Status:  DNR Goals of care: Advanced Directive information    01/22/2023   10:00 AM  Advanced Directives  Does Patient Have a Medical Advance Directive? Yes  Type of Estate agent of [place];Living will;Out of facility DNR (pink MOST or yellow form)  Does patient want to make changes to medical advance directive? No - Patient declined  Copy of Healthcare Power of Attorney in Chart? Yes - validated most recent copy scanned in chart (See row information)     Chief Complaint  Patient presents with   Acute Visit    Hemorrhoids     HPI:  Pt is a 82 y.o. female seen today for an acute visit due to hemorrhoids.   She currently resides on the skilled nursing unit at [facility]. PMH: HTN, HLD, GERD, IBS, T2DM, polyneuropathy, DJD, osteopenia, Factor 5 mutation, breast cancer s/p  left lumpectomy 1996, incontinence, and anxiety.   07/14 she c/o pain when defecating. H/o hemorrhoids. She currently takes colace and anusol prn. She has not received either medications within past day. External hemorrhoid confirmed on exam today. She denies blood when using bathroom. Admits to hard stools. Afebrile. Vitals stable.    Past Medical History:  Diagnosis Date   Allergy    [name]; allergy shots weekly.     Anxiety    Arthritis    DDD lumbar spine.      Cancer (HCC)    breast L x 2; skin basal cell carcinoma [name]).   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   Past Surgical History:  Procedure Laterality Date   BREAST LUMPECTOMY  1996   L breast cancer   BREAST SURGERY N/A    [employer]reesia 12/12/2019   EYE SURGERY N/A    [employer]reesia 12/12/2019   MASTECTOMY  2002   Bilateral for breast cancer   VAGINAL HYSTERECTOMY  1985   DUB; uterine fibroids; ovaries intact.    Allergies  Allergen Reactions   Fruit & Vegetable Daily [Nutritional Supplements] Diarrhea   Mixed Ragweed    Onion Other (See Comments)    indigestion   Tree Extract Swelling   Sulfa Drugs Cross Reactors Rash    All over the body    Allergies as of 01/22/2023       Reactions   Fruit & Vegetable Daily [nutritional Supplements] Diarrhea   Mixed Ragweed    Onion Other (See Comments)   indigestion   Tree Extract Swelling   Sulfa Drugs Cross Reactors Rash   All over the body        Medication List        Accurate as of January 22, 2023 10:00 AM. If  you have any questions, ask your nurse or doctor.          STOP taking these medications    cetirizine 10 MG tablet Commonly known as: ZYRTEC Stopped by: Octavia Heir       TAKE these medications    acetaminophen 500 MG tablet Commonly known as: TYLENOL Take 1,000 mg by mouth every 12 (twelve) hours as needed. What changed: Another medication with the same name was removed. Continue taking this medication, and follow the directions you see here. Changed by: Octavia Heir   acetaminophen 500 MG tablet Commonly known as: TYLENOL Take 1,000 mg by mouth every evening. What changed: Another medication with the same name was removed. Continue taking this medication, and follow the directions you see  here. Changed by: Octavia Heir   albuterol 108 (90 Base) MCG/ACT inhaler Commonly known as: VENTOLIN HFA Inhale 2 puffs into the lungs every 6 (six) hours as needed for wheezing or shortness of breath.   antiseptic oral rinse Liqd 30 mLs by Mouth Rinse route 2 (two) times daily. For dry mouth   ANUSOL-HC RE Place 1 application  rectally as needed.   aspirin 81 MG chewable tablet Chew by mouth daily.   calcium carbonate 500 MG chewable tablet Commonly known as: Tums Chew 1-2 tablets (200-400 mg of elemental calcium total) by mouth 3 (three) times daily as needed for indigestion or heartburn.   clotrimazole 1 % vaginal cream Commonly known as: GYNE-LOTRIMIN Place 1 Applicatorful vaginally as needed.   docusate sodium 100 MG capsule Commonly known as: COLACE Take 100 mg by mouth 2 (two) times daily as needed for mild constipation. What changed: Another medication with the same name was removed. Continue taking this medication, and follow the directions you see here. Changed by: Octavia Heir   EYE ITCH RELIEF OP Place 1 drop into both eyes every 12 (twelve) hours as needed.   fluconazole 100 MG tablet Commonly known as: DIFLUCAN Take 150 mg by mouth once a week.   fluticasone 50 MCG/ACT nasal spray Commonly known as: FLONASE Place 2 sprays into both nostrils daily.   gabapentin 100 MG capsule Commonly known as: NEURONTIN Take 200 mg by mouth 2 (two) times daily.   glipiZIDE 10 MG 24 hr tablet Commonly known as: GLUCOTROL XL Take 1 tablet (10 mg total) by mouth daily with breakfast.   lisinopril 2.5 MG tablet Commonly known as: ZESTRIL Take 2.5 mg by mouth in the morning.   loperamide 2 MG tablet Commonly known as: IMODIUM A-D Take 2 mg by mouth every 12 (twelve) hours as needed for diarrhea or loose stools.   Melatonin 5 MG Caps Take 1 capsule by mouth daily. As needed for insomnia   metFORMIN 500 MG tablet Commonly known as: GLUCOPHAGE Take 1 tablet (500 mg  total) by mouth daily with breakfast AND 0.5 tablets (250 mg total) daily with supper.   metoprolol succinate 100 MG 24 hr tablet Commonly known as: TOPROL-XL Take 100 mg by mouth 2 (two) times daily. Take with or immediately following a meal.   montelukast 10 MG tablet Commonly known as: SINGULAIR Take 10 mg by mouth every morning.   pravastatin 10 MG tablet Commonly known as: PRAVACHOL Take 1 tablet (10 mg total) by mouth daily.   sertraline 50 MG tablet Commonly known as: ZOLOFT Take 50 mg by mouth daily.   zinc oxide 20 % ointment Apply 1 application  topically as needed (To buttocks after every  incontinent epsiode and as needed for redness).        Review of Systems  Constitutional:  Negative for activity change and appetite change.  Respiratory:  Negative for cough, shortness of breath and wheezing.   Cardiovascular:  Negative for chest pain and leg swelling.  Gastrointestinal:  Positive for constipation. Negative for abdominal distention, abdominal pain and blood in stool.       Hard stools  Psychiatric/Behavioral:  Positive for confusion and dysphoric mood. Negative for sleep disturbance. The patient is not nervous/anxious.     Immunization History  Administered Date(s) Administered   Fluad Quad(high Dose 65+) 05/03/2022   Influenza Split 03/19/2012, 03/19/2013   Influenza, High Dose Seasonal PF 04/09/2014, 03/22/2015, 03/24/2016, 03/29/2018, 04/16/2020   Influenza,inj,Quad PF,6+ Mos 04/09/2014, 03/22/2015, 03/24/2016   Influenza-Unspecified 03/19/2012, 03/19/2013, 04/09/2017, 03/24/2018, 04/16/2020   Moderna SARS-COV2 Booster Vaccination 05/16/2022   Moderna Sars-Covid-2 Vaccination 05/19/2021   PFIZER(Purple Top)SARS-COV-2 Vaccination 08/17/2019, 09/10/2019, 04/06/2020, 11/19/2020   Pfizer Covid-19 Vaccine Bivalent Booster 66yrs & up 11/18/2020   Pneumococcal Conjugate-13 11/05/2014   Pneumococcal Polysaccharide-23 07/10/2004, 12/12/2010, 05/02/2016    Pneumococcal-Unspecified 07/10/2004, 12/12/2010, 05/02/2016   Respiratory Syncytial Virus Vaccine,Recomb Aduvanted(Arexvy) 06/24/2022   Td 07/10/2009   Tdap 07/06/2022   Zoster Recombinant(Shingrix) 04/09/2017, 07/19/2017   Zoster, Live 07/10/2006   Pertinent  Health Maintenance Due  Topic Date Due   INFLUENZA VACCINE  02/08/2023   OPHTHALMOLOGY EXAM  03/18/2023   HEMOGLOBIN A1C  05/26/2023   FOOT EXAM  09/13/2023   DEXA SCAN  Completed      12/25/2019    2:02 PM 09/18/2020   12:44 PM 01/20/2022   12:54 PM 07/31/2022    9:44 AM 01/10/2023    3:48 PM  Fall Risk  Falls in the past year? 0  1 0 0  Was there an injury with Fall? 0  0 0 0  Fall Risk Category Calculator 0  1 0 0  Fall Risk Category (Retired) Low  Low    (RETIRED) Patient Fall Risk Level Low fall risk Low fall risk High fall risk    Patient at Risk for Falls Due to   History of fall(s);Impaired balance/gait;Impaired mobility History of fall(s);Impaired mobility;Impaired balance/gait No Fall Risks  Fall risk Follow up Falls evaluation completed;Education provided  Falls evaluation completed;Education provided;Falls prevention discussed Falls evaluation completed Falls evaluation completed   Functional Status Survey:    Vitals:   01/22/23 0949  BP: 112/65  Pulse: 72  Resp: 18  Temp: 97.9 F (36.6 C)  SpO2: 95%  Weight: 167 lb 8 oz (76 kg)  Height: 5\' 4"  (1.626 m)   Body mass index is 28.75 kg/m. Physical Exam Vitals reviewed. Exam conducted with a chaperone present.  Constitutional:      General: She is not in acute distress. HENT:     Head: Normocephalic.  Eyes:     General:        Right eye: No discharge.        Left eye: No discharge.  Cardiovascular:     Rate and Rhythm: Normal rate and regular rhythm.     Pulses: Normal pulses.     Heart sounds: Normal heart sounds.  Pulmonary:     Effort: Pulmonary effort is normal. No respiratory distress.     Breath sounds: Normal breath sounds. No wheezing.   Abdominal:     General: Bowel sounds are normal.     Palpations: Abdomen is soft.  Genitourinary:    Rectum: External hemorrhoid present.  No tenderness or internal hemorrhoid.  Skin:    General: Skin is warm.     Capillary Refill: Capillary refill takes less than 2 seconds.  Neurological:     General: No focal deficit present.     Mental Status: She is alert and oriented to person, place, and time.     Motor: Weakness present.     Gait: Gait abnormal.  Psychiatric:        Mood and Affect: Mood normal.     Labs reviewed: Recent Labs    06/12/22 0000 11/23/22 0000  NA 140 139  K 4.4 4.5  CL 105 107  CO2 24* 22  BUN 25* 21  CREATININE 1.2* 1.3*  CALCIUM 9.4 8.8   Recent Labs    06/12/22 0000  AST 14  ALT 12  ALKPHOS 122  ALBUMIN 4.2   Recent Labs    06/12/22 0000 11/23/22 0000  WBC 9.8 9.2  NEUTROABS  --  6,017.00  HGB 14.4 12.0  HCT 43 36  PLT 381 323   Lab Results  Component Value Date   TSH 3.100 08/12/2019   Lab Results  Component Value Date   HGBA1C 7.2 11/23/2022   Lab Results  Component Value Date   CHOL 191 06/12/2022   HDL 45 06/12/2022   LDLCALC 114 06/12/2022   TRIG 196 (A) 06/12/2022   CHOLHDL 4.4 12/19/2018    Significant Diagnostic Results in last 30 days:  No results found.  Assessment/Plan 1. External hemorrhoid - suspect due to hard stools - one external hemorrhoid noted on exam - start anusol and senna - discontinue colace - hydrocortisone (ANUSOL-HC) 2.5 % rectal cream; Place 1 Application rectally 2 (two) times daily for 7 days. - senna (SENOKOT) 8.6 MG TABS tablet; Take 2 tablets (17.2 mg total) by mouth at bedtime.    Family/ staff Communication: plan discussed with patient and nurse  Labs/tests ordered: none

## 2023-02-02 ENCOUNTER — Encounter: Payer: Self-pay | Admitting: Orthopedic Surgery

## 2023-02-02 ENCOUNTER — Non-Acute Institutional Stay (INDEPENDENT_AMBULATORY_CARE_PROVIDER_SITE_OTHER): Payer: Medicare PPO | Admitting: Orthopedic Surgery

## 2023-02-02 DIAGNOSIS — Z0000 Encounter for general adult medical examination without abnormal findings: Secondary | ICD-10-CM | POA: Diagnosis not present

## 2023-02-02 NOTE — Progress Notes (Signed)
Subjective:   [name] [name] is a 82 y.o. female who presents for Medicare Annual (Subsequent) preventive examination.  Visit Complete: In person  Patient Medicare AWV questionnaire was completed by the patient on 02/02/2023; I have confirmed that all information answered by patient is correct and no changes since this date.  Review of Systems     Cardiac Risk Factors include: advanced age (>35men, >102 women);diabetes mellitus;sedentary lifestyle;hypertension;dyslipidemia     Objective:    Today's Vitals   02/02/23 1002  BP: 124/72  Pulse: 62  Resp: 19  Temp: (!) 96.8 F (36 C)  SpO2: 94%  Weight: 167 lb 8 oz (76 kg)  Height: 5\' 4"  (1.626 m)   Body mass index is 28.75 kg/m.     02/02/2023   10:28 AM 01/22/2023   10:00 AM 01/10/2023    3:49 PM 11/09/2022   11:45 AM 10/13/2022    9:35 AM 09/13/2022   11:08 AM 08/23/2022   10:51 AM  Advanced Directives  Does Patient Have a Medical Advance Directive? Yes Yes Yes Yes Yes Yes Yes  Type of Estate agent of [place];Living will;Out of facility DNR (pink MOST or yellow form) Healthcare Power of [place];Living will;Out of facility DNR (pink MOST or yellow form) Living will;Out of facility DNR (pink MOST or yellow form) Healthcare Power of [place];Living will;Out of facility DNR (pink MOST or yellow form) Healthcare Power of [place];Living will;Out of facility DNR (pink MOST or yellow form) Healthcare Power of [place];Living will;Out of facility DNR (pink MOST or yellow form) Healthcare Power of [place];Living will;Out of facility DNR (pink MOST or yellow form)  Does patient want to make changes to medical advance directive? No - Patient declined No - Patient declined No - Patient declined No - Patient declined No - Patient declined No - Patient declined No - Patient declined  Copy of Healthcare Power of Attorney in Chart? Yes - validated most recent copy scanned in chart (See row information) Yes - validated most  recent copy scanned in chart (See row information)  Yes - validated most recent copy scanned in chart (See row information) Yes - validated most recent copy scanned in chart (See row information) Yes - validated most recent copy scanned in chart (See row information) Yes - validated most recent copy scanned in chart (See row information)  Pre-existing out of facility DNR order (yellow form or pink MOST form)    Yellow form placed in chart (order not valid for inpatient use) Yellow form placed in chart (order not valid for inpatient use)  Pink MOST form placed in chart (order not valid for inpatient use);Yellow form placed in chart (order not valid for inpatient use)    Current Medications (verified) Outpatient Encounter Medications as of 02/02/2023  Medication Sig   acetaminophen (TYLENOL) 500 MG tablet Take 1,000 mg by mouth every 12 (twelve) hours as needed.   acetaminophen (TYLENOL) 500 MG tablet Take 1,000 mg by mouth every evening.   albuterol (VENTOLIN HFA) 108 (90 Base) MCG/ACT inhaler Inhale 2 puffs into the lungs every 6 (six) hours as needed for wheezing or shortness of breath.   antiseptic oral rinse (BIOTENE) LIQD 30 mLs by Mouth Rinse route 2 (two) times daily. For dry mouth   aspirin 81 MG chewable tablet Chew by mouth daily.   calcium carbonate (TUMS) 500 MG chewable tablet Chew 1-2 tablets (200-400 mg of elemental calcium total) by mouth 3 (three) times daily as needed for indigestion or heartburn.  clotrimazole (GYNE-LOTRIMIN) 1 % vaginal cream Place 1 Applicatorful vaginally as needed.   fluconazole (DIFLUCAN) 100 MG tablet Take 150 mg by mouth once a week.   fluticasone (FLONASE) 50 MCG/ACT nasal spray Place 2 sprays into both nostrils daily.   gabapentin (NEURONTIN) 100 MG capsule Take 200 mg by mouth 2 (two) times daily.   glipiZIDE (GLUCOTROL XL) 10 MG 24 hr tablet Take 1 tablet (10 mg total) by mouth daily with breakfast.   Hydrocortisone Acetate (ANUSOL-HC RE) Place 1  application  rectally as needed.   Ketotifen Fumarate (EYE ITCH RELIEF OP) Place 1 drop into both eyes every 12 (twelve) hours as needed.   lisinopril (ZESTRIL) 2.5 MG tablet Take 2.5 mg by mouth in the morning.   loperamide (IMODIUM A-D) 2 MG tablet Take 2 mg by mouth every 12 (twelve) hours as needed for diarrhea or loose stools.   Melatonin 5 MG CAPS Take 1 capsule by mouth daily. As needed for insomnia   metFORMIN (GLUCOPHAGE) 500 MG tablet Take 1 tablet (500 mg total) by mouth daily with breakfast AND 0.5 tablets (250 mg total) daily with supper.   metoprolol succinate (TOPROL-XL) 100 MG 24 hr tablet Take 100 mg by mouth 2 (two) times daily. Take with or immediately following a meal.   montelukast (SINGULAIR) 10 MG tablet Take 10 mg by mouth every morning.   pravastatin (PRAVACHOL) 10 MG tablet Take 1 tablet (10 mg total) by mouth daily.   senna (SENOKOT) 8.6 MG TABS tablet Take 2 tablets (17.2 mg total) by mouth at bedtime.   sertraline (ZOLOFT) 50 MG tablet Take 50 mg by mouth daily.   zinc oxide 20 % ointment Apply 1 application  topically as needed (To buttocks after every incontinent epsiode and as needed for redness).   No facility-administered encounter medications on file as of 02/02/2023.    Allergies (verified) Fruit & vegetable daily [nutritional supplements], Mixed ragweed, Onion, Tree extract, and Sulfa drugs cross reactors   History: Past Medical History:  Diagnosis Date   Allergy    [name]; allergy shots weekly.     Anxiety    Arthritis    DDD lumbar spine.     Cancer (HCC)    breast L x 2; skin basal cell carcinoma [name]).   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   Past Surgical History:  Procedure Laterality Date   BREAST LUMPECTOMY  1996   L breast  cancer   BREAST SURGERY N/A    [facility] 12/12/2019   EYE SURGERY N/A    [facility] 12/12/2019   MASTECTOMY  2002   Bilateral for breast cancer   VAGINAL HYSTERECTOMY  1985   DUB; uterine fibroids; ovaries intact.   Family History  Problem Relation Age of Onset   Heart disease Mother        AMI as cause of death age 67.   Heart disease Father        AMI as cause of death.   Heart disease Brother    Social History   Socioeconomic History   Marital status: Married    Spouse name: Brett Canales   Number of children: Not on file   Years of education: Not on file   Highest education level: Master's degree (e.g., MA,  MS, MEng, MEd, MSW, MBA)  Occupational History   Occupation: retired    Comment: retired  Tobacco Use   Smoking status: Never   Smokeless tobacco: Never  Vaping Use   Vaping status: Never Used  Substance and Sexual Activity   Alcohol use: No    Alcohol/week: 0.0 standard drinks of alcohol    Comment: rare   Drug use: No   Sexual activity: Never  Other Topics Concern   Not on file  Social History Narrative   09/30/19 Marital status: married x 20 years; second marriage; happily married      Children:  2 children; 3 grandchildren; no gg      Lives: with husband      Employment: retired in 2005; 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      Tobacco: never      Alcohol: rarely;       Exercise:  Silver Sneakers; Tai Chi once per week; exercises 3 days per week; balance class      ADLs: independent with ADLs; drives.  Husband does the cleaning and lifting groceries.      Advanced Directives: YES;  FULL CODE no prolonged measures.              Social Determinants of Health   Financial Resource Strain: Low Risk  (02/02/2023)   Overall Financial Resource Strain (CARDIA)    Difficulty of Paying Living Expenses: Not hard at all  Food Insecurity: No Food Insecurity (02/02/2023)   Hunger Vital Sign    Worried About Running Out of Food in the Last Year:  Never true    Ran Out of Food in the Last Year: Never true  Transportation Needs: No Transportation Needs (02/02/2023)   PRAPARE - Administrator, Civil Service (Medical): No    Lack of Transportation (Non-Medical): No  Physical Activity: Inactive (02/02/2023)   Exercise Vital Sign    Days of Exercise per Week: 0 days    Minutes of Exercise per Session: 0 min  Stress: No Stress Concern Present (02/02/2023)   [employer] of Occupational Health - Occupational Stress Questionnaire    Feeling of Stress : Only a little  Social Connections: Moderately Isolated (02/02/2023)   Social Connection and Isolation Panel [NHANES]    Frequency of Communication with Friends and Family: Three times a week    Frequency of Social Gatherings with Friends and Family: Three times a week    Attends Religious Services: Never    Active Member of Clubs or Organizations: No    Attends Banker Meetings: Never    Marital Status: Married    Tobacco Counseling Counseling given: Not Answered   Clinical Intake:  Pre-visit preparation completed: No  Pain : No/denies pain     BMI - recorded: 28.75 Nutritional Status: BMI 25 -29 Overweight Nutritional Risks: None Diabetes: Yes CBG done?: Yes CBG resulted in Enter/ Edit results?: No Did pt. bring in CBG monitor from home?: No  How often do you need to have someone help you when you read instructions, pamphlets, or other written materials from your doctor or pharmacy?: 3 - Sometimes What is the last grade level you completed in school?: Masters  Interpreter Needed?: No      Activities of Daily Living    02/02/2023    2:43 PM  In your present state of health, do you have any difficulty performing the following activities:  Hearing? 0  Vision? 0  Difficulty concentrating or  making decisions? 1  Walking or climbing stairs? 1  Dressing or bathing? 1  Doing errands, shopping? 1  Preparing Food and eating ? Y  Using the  Toilet? Y  In the past six months, have you accidently leaked urine? Y  Do you have problems with loss of bowel control? Y  Managing your Medications? Y  Managing your Finances? Y  Housekeeping or managing your Housekeeping? Y    Patient Care Team: [name], MD as PCP - General (Internal Medicine) Ernesto Rutherford, MD (Ophthalmology) Claud Kelp, MD as Consulting Physician (General Surgery) Carlus Pavlov, MD as Consulting Physician (Internal Medicine)  Indicate any recent Medical Services you may have received from other than [facility] providers in the past year (date may be approximate).     Assessment:   This is a routine wellness examination for Chloa.  Hearing/Vision screen No results found.  Dietary issues and exercise activities discussed:     Goals Addressed             This Visit's Progress    declined   On track    Patient declined healthcare goal today.      Maintain Mobility and Function   Not on track    Evidence-based guidance:  Emphasize the importance of physical activity and aerobic exercise as included in treatment plan; assess barriers to adherence; consider patient's abilities and preferences.  Encourage gradual increase in activity or exercise instead of stopping if pain occurs.  Reinforce individual therapy exercise prescription, such as strengthening, stabilization and stretching programs.  Promote optimal body mechanics to stabilize the spine with lifting and functional activity.  Encourage activity and mobility modifications to facilitate optimal function, such as using a log roll for bed mobility or dressing from a seated position.  Reinforce individual adaptive equipment recommendations to limit excessive spinal movements, such as a Event organiser.  Assess adequacy of sleep; encourage use of sleep hygiene techniques, such as bedtime routine; use of white noise; dark, cool bedroom; avoiding daytime naps, heavy meals or exercise before  bedtime.  Promote positions and modification to optimize sleep and sexual activity; consider pillows or positioning devices to assist in maintaining neutral spine.  Explore options for applying ergonomic principles at work and home, such as frequent position changes, using ergonomically designed equipment and working at optimal height.  Promote modifications to increase comfort with driving such as lumbar support, optimizing seat and steering wheel position, using cruise control and taking frequent rest stops to stretch and walk.   Notes:        Depression Screen    07/31/2022    9:46 AM 12/25/2019    2:02 PM 12/15/2019    4:00 PM 08/12/2019    1:34 PM 12/23/2018   12:00 PM 11/12/2017    2:22 PM 05/11/2017    2:36 PM  PHQ 2/9 Scores  PHQ - 2 Score 0 0 0 0 0 0 0    Fall Risk    02/02/2023    2:47 PM 01/10/2023    3:48 PM 07/31/2022    9:44 AM 01/20/2022   12:54 PM 12/25/2019    2:02 PM  Fall Risk   Falls in the past year? 0 0 0 1 0  Number falls in past yr: 0 0 0 0 0  Injury with Fall? 0 0 0 0 0  Risk for fall due to : History of fall(s);Impaired balance/gait;Impaired mobility No Fall Risks History of fall(s);Impaired mobility;Impaired balance/gait History of fall(s);Impaired balance/gait;Impaired mobility  Follow up Falls evaluation completed;Education provided;Falls prevention discussed Falls evaluation completed Falls evaluation completed Falls evaluation completed;Education provided;Falls prevention discussed Falls evaluation completed;Education provided    MEDICARE RISK AT HOME:  Medicare Risk at Home - 02/02/23 1447     Any stairs in or around the home? No    If so, are there any without handrails? No    Home free of loose throw rugs in walkways, pet beds, electrical cords, etc? Yes    Adequate lighting in your home to reduce risk of falls? Yes    Life alert? No    Use of a cane, walker or w/c? Yes    Grab bars in the bathroom? Yes    Shower chair or bench in shower? Yes     Elevated toilet seat or a handicapped toilet? Yes             TIMED UP AND GO:  Was the test performed?  No    Cognitive Function:    02/02/2023    2:50 PM 01/20/2022   12:54 PM 09/30/2019    1:24 PM  MMSE - Mini Mental State Exam  Not completed: Refused    Orientation to time  4 5  Orientation to Place  5 4  Registration  3 3  Attention/ Calculation  4 3  Recall  2 3  Language- name 2 objects  2 2  Language- repeat  1 0  Language- follow 3 step command  3 3  Language- read & follow direction  1 1  Write a sentence  1 1  Copy design  1 0  Total score  27 25        01/20/2022   12:55 PM 12/25/2019    2:00 PM 05/11/2017    2:43 PM  6CIT Screen  What Year? 0 points 0 points 0 points  What month? 0 points 0 points 0 points  What time? 0 points 0 points 0 points  Count back from 20 0 points 0 points 0 points  Months in reverse 0 points 0 points 0 points  Repeat phrase 0 points 0 points 0 points  Total Score 0 points 0 points 0 points    Immunizations Immunization History  Administered Date(s) Administered   Fluad Quad(high Dose 65+) 05/03/2022   Influenza Split 03/19/2012, 03/19/2013   Influenza, High Dose Seasonal PF 04/09/2014, 03/22/2015, 03/24/2016, 03/29/2018, 04/16/2020   Influenza,inj,Quad PF,6+ Mos 04/09/2014, 03/22/2015, 03/24/2016   Influenza-Unspecified 03/19/2012, 03/19/2013, 04/09/2017, 03/24/2018, 04/16/2020   Moderna SARS-COV2 Booster Vaccination 05/16/2022   Moderna Sars-Covid-2 Vaccination 05/19/2021   PFIZER(Purple Top)SARS-COV-2 Vaccination 08/17/2019, 09/10/2019, 04/06/2020, 11/19/2020   Pfizer Covid-19 Vaccine Bivalent Booster 17yrs & up 11/18/2020   Pneumococcal Conjugate-13 11/05/2014   Pneumococcal Polysaccharide-23 07/10/2004, 12/12/2010, 05/02/2016   Pneumococcal-Unspecified 07/10/2004, 12/12/2010, 05/02/2016   Respiratory Syncytial Virus Vaccine,Recomb Aduvanted(Arexvy) 06/24/2022   Td 07/10/2009   Tdap 07/06/2022   Zoster  Recombinant(Shingrix) 04/09/2017, 07/19/2017   Zoster, Live 07/10/2006    TDAP status: Up to date  Flu Vaccine status: Up to date  Pneumococcal vaccine status: Up to date  Covid-19 vaccine status: Completed vaccines  Qualifies for Shingles Vaccine? Yes   Zostavax completed Yes   Shingrix Completed?: Yes  Screening Tests Health Maintenance  Topic Date Due   COVID-19 Vaccine (7 - 2023-24 season) 03/11/2023 (Originally 07/11/2022)   Diabetic kidney evaluation - Urine ACR  08/01/2027 (Originally 05/17/2019)   INFLUENZA VACCINE  02/08/2023   OPHTHALMOLOGY EXAM  03/18/2023   HEMOGLOBIN A1C  05/26/2023  Diabetic kidney evaluation - eGFR measurement  11/23/2023   FOOT EXAM  02/02/2024   Medicare Annual Wellness (AWV)  02/02/2024   DTaP/Tdap/Td (3 - Td or Tdap) 07/06/2032   Pneumonia Vaccine 74+ Years old  Completed   DEXA SCAN  Completed   Zoster Vaccines- Shingrix  Completed   HPV VACCINES  Aged Out    Health Maintenance  There are no preventive care reminders to display for this patient.   Colorectal cancer screening: No longer required.   Mammogram status: No longer required due to advanced age.  Bone Density status: Completed 07/2018. Results reflect: Bone density results: OSTEOPENIA. Repeat every 2 years.  Lung Cancer Screening: (Low Dose CT Chest recommended if Age 51-80 years, 20 pack-year currently smoking OR have quit w/in 15years.) does not qualify.   Lung Cancer Screening Referral: No  Additional Screening:  Hepatitis C Screening: does not qualify; Completed   Vision Screening: Recommended annual ophthalmology exams for early detection of glaucoma and other disorders of the eye. Is the patient up to date with their annual eye exam?  Yes  Who is the provider or what is the name of the office in which the patient attends annual eye exams? [facility]- in house provider If pt is not established with a provider, would they like to be referred to a provider to  establish care? No .   Dental Screening: Recommended annual dental exams for proper oral hygiene  Diabetic Foot Exam: Diabetic Foot Exam: Completed 02/02/2023  Community Resource Referral / Chronic Care Management: CRR required this visit?  No   CCM required this visit?  No     Plan:     I have personally reviewed and noted the following in the patient's chart:   Medical and social history Use of alcohol, tobacco or illicit drugs  Current medications and supplements including opioid prescriptions. Patient is not currently taking opioid prescriptions. Functional ability and status Nutritional status Physical activity Advanced directives List of other physicians Hospitalizations, surgeries, and ER visits in previous 12 months Vitals Screenings to include cognitive, depression, and falls Referrals and appointments  In addition, I have reviewed and discussed with patient certain preventive protocols, quality metrics, and best practice recommendations. A written personalized care plan for preventive services as well as general preventive health recommendations were provided to patient.     Octavia Heir, NP   02/02/2023   After Visit Summary: (MyChart) Due to this being a telephonic visit, the after visit summary with patients personalized plan was offered to patient via MyChart   Nurse Notes: MMSE > refused, upset when asked to perform memory portion of encounter. DEXA 07/2018. UTD on all vaccines.

## 2023-02-14 ENCOUNTER — Non-Acute Institutional Stay (SKILLED_NURSING_FACILITY): Payer: Medicare PPO | Admitting: Orthopedic Surgery

## 2023-02-14 ENCOUNTER — Encounter: Payer: Self-pay | Admitting: Orthopedic Surgery

## 2023-02-14 DIAGNOSIS — N1831 Chronic kidney disease, stage 3a: Secondary | ICD-10-CM | POA: Diagnosis not present

## 2023-02-14 DIAGNOSIS — E1165 Type 2 diabetes mellitus with hyperglycemia: Secondary | ICD-10-CM | POA: Diagnosis not present

## 2023-02-14 DIAGNOSIS — E1169 Type 2 diabetes mellitus with other specified complication: Secondary | ICD-10-CM | POA: Diagnosis not present

## 2023-02-14 DIAGNOSIS — I10 Essential (primary) hypertension: Secondary | ICD-10-CM

## 2023-02-14 DIAGNOSIS — R309 Painful micturition, unspecified: Secondary | ICD-10-CM

## 2023-02-14 DIAGNOSIS — F03A0 Unspecified dementia, mild, without behavioral disturbance, psychotic disturbance, mood disturbance, and anxiety: Secondary | ICD-10-CM

## 2023-02-14 DIAGNOSIS — F339 Major depressive disorder, recurrent, unspecified: Secondary | ICD-10-CM

## 2023-02-14 DIAGNOSIS — E785 Hyperlipidemia, unspecified: Secondary | ICD-10-CM

## 2023-02-14 DIAGNOSIS — G629 Polyneuropathy, unspecified: Secondary | ICD-10-CM

## 2023-02-14 MED ORDER — PRAVASTATIN SODIUM 10 MG PO TABS
20.0000 mg | ORAL_TABLET | Freq: Every day | ORAL | Status: DC
Start: 2023-02-14 — End: 2023-03-14

## 2023-02-14 NOTE — Progress Notes (Signed)
Location:   [facility]  Nursing Home Room Number: 26-A Place of Service:  SNF [phone]) Provider:  Hazle Nordmann, NP  PCP: [name], MD  Patient Care Team: [name], MD as PCP - General (Internal Medicine) Ernesto Rutherford, MD (Ophthalmology) Claud Kelp, MD as Consulting Physician (General Surgery) Carlus Pavlov, MD as Consulting Physician (Internal Medicine)  Extended Emergency Contact Information Primary Emergency Contact: [name],[name] Address: [address]          [place] [address] [place] Home Phone: [phone] Mobile Phone: [phone] Relation: Spouse  Code Status:  DNR Goals of care: Advanced Directive information    02/14/2023    9:37 AM  Advanced Directives  Does Patient Have a Medical Advance Directive? Yes  Type of Estate agent of [place];Living will;Out of facility DNR (pink MOST or yellow form)  Does patient want to make changes to medical advance directive? No - Patient declined  Copy of Healthcare Power of Attorney in Chart? Yes - validated most recent copy scanned in chart (See row information)     Chief Complaint  Patient presents with   Medical Management of Chronic Issues    Routine Visit.    Immunizations    Discuss the need for Flu vaccine.    HPI:  Pt is a 82 y.o. female seen today for medical management of chronic diseases.    She currently resides on the skilled nursing unit at [facility]. PMH: HTN, HLD, GERD, IBS, T2DM, polyneuropathy, DJD, osteopenia, Factor 5 mutation, breast cancer s/p  left lumpectomy 1996, incontinence, and anxiety.    Followed by palliative. Hospice discharged last year.    HTN- BUN/creat 21/1.28, GFR 42 (05/16) , remains on metoprolol and lisinopril HLD- on pravastatin CKD- see above  T2DM- A1c 7.2 (05/16)> was 7.0 (12/04)>, urine microalbumin 151 12/13/2021, no hypoglycemic events, blood sugars 160-200's, metformin recently reduced/ glipizide  increased due to declining renal function, off Invokana due to frequent genitourinary infections, last eye exam 03/2022 Dementia-  MMSE 25/30 2021, BIMS score 15/15 (06/24)> was 15/15 (03/21), diagnosed with neurodegenerative disorder per neurology, ambulates with w/c, behaviors towards husband within past month, not on medication Peripheral neuropathy- remains on gabapentin Painful micturition- increased pain when urinating, 09/06 urine culture negative for growth, CT abdomen/pelvis negative for hydronephrosis/calculi/neoplasm,improved with scheduled Diflucan Depression- Na+ 139 11/23/2022,remains on Zoloft   Hemorrhoids resolved with anusol and senna.    Recent blood pressures:  08/06- 120/70  07/30- 106/66  07/23- 124/72  Recent weights:  08/01- 168.9 lbs  07/01- 167.5 lbs  06/01- 167.5 lbs  08/01- 159.7 lbs   Past Medical History:  Diagnosis Date   Allergy    [name]; allergy shots weekly.     Anxiety    Arthritis    DDD lumbar spine.     Cancer (HCC)    breast L x 2; skin basal cell carcinoma [name]).   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   Past Surgical History:  Procedure Laterality Date   BREAST LUMPECTOMY  1996   L breast cancer   BREAST SURGERY N/A    Phreesia 12/12/2019   EYE SURGERY N/A    Phreesia 12/12/2019   MASTECTOMY  2002   Bilateral for breast  cancer   VAGINAL HYSTERECTOMY  1985   DUB; uterine fibroids; ovaries intact.    Allergies  Allergen Reactions   Fruit & Vegetable Daily [Nutritional Supplements] Diarrhea   Mixed Ragweed    Onion Other (See Comments)    indigestion   Tree Extract Swelling   Sulfa Drugs Cross Reactors Rash    All over the body    Allergies as of 02/14/2023       Reactions   Fruit & Vegetable Daily [nutritional  Supplements] Diarrhea   Mixed Ragweed    Onion Other (See Comments)   indigestion   Tree Extract Swelling   Sulfa Drugs Cross Reactors Rash   All over the body        Medication List        Accurate as of February 14, 2023  9:38 AM. If you have any questions, ask your nurse or doctor.          acetaminophen 500 MG tablet Commonly known as: TYLENOL Take 1,000 mg by mouth every 12 (twelve) hours as needed.   acetaminophen 500 MG tablet Commonly known as: TYLENOL Take 1,000 mg by mouth every evening.   albuterol 108 (90 Base) MCG/ACT inhaler Commonly known as: VENTOLIN HFA Inhale 2 puffs into the lungs every 6 (six) hours as needed for wheezing or shortness of breath.   antiseptic oral rinse Liqd 30 mLs by Mouth Rinse route 2 (two) times daily. For dry mouth   ANUSOL-HC RE Place 1 application  rectally as needed.   aspirin 81 MG chewable tablet Chew by mouth daily.   calcium carbonate 500 MG chewable tablet Commonly known as: Tums Chew 1-2 tablets (200-400 mg of elemental calcium total) by mouth 3 (three) times daily as needed for indigestion or heartburn.   clotrimazole 1 % vaginal cream Commonly known as: GYNE-LOTRIMIN Place 1 Applicatorful vaginally as needed.   EYE ITCH RELIEF OP Place 1 drop into both eyes every 12 (twelve) hours as needed.   fluconazole 150 MG tablet Commonly known as: DIFLUCAN Take 150 mg by mouth as directed. Weekly on Tuesdays at bedtime. What changed: Another medication with the same name was removed. Continue taking this medication, and follow the directions you see here. Changed by: Luberta Robertson    fluticasone 50 MCG/ACT nasal spray Commonly known as: FLONASE Place 1 spray into both nostrils as needed for allergies or rhinitis. What changed: Another medication with the same name was removed. Continue taking this medication, and follow the directions you see here. Changed by: Octavia Heir   gabapentin 100 MG capsule Commonly known  as: NEURONTIN Take 200 mg by mouth 2 (two) times daily.   glipiZIDE 10 MG 24 hr tablet Commonly known as: GLUCOTROL XL Take 1 tablet (10 mg total) by mouth daily with breakfast.   lisinopril 2.5 MG tablet Commonly known as: ZESTRIL Take 2.5 mg by mouth in the morning.   loperamide 2 MG tablet Commonly known as: IMODIUM A-D Take 2 mg by mouth every 12 (twelve) hours as needed for diarrhea or loose stools.   Melatonin 5 MG Caps Take 1 capsule by mouth daily. As needed for insomnia   metFORMIN 500 MG tablet Commonly known as: GLUCOPHAGE Take 250 mg by mouth every evening. What changed: Another medication with the same name was removed. Continue taking this medication, and follow the directions you see here. Changed by: Octavia Heir   metFORMIN 500 MG tablet Commonly known as: GLUCOPHAGE Take 500 mg by mouth  every morning. What changed: Another medication with the same name was removed. Continue taking this medication, and follow the directions you see here. Changed by: Octavia Heir   metoprolol succinate 100 MG 24 hr tablet Commonly known as: TOPROL-XL Take 100 mg by mouth 2 (two) times daily. Take with or immediately following a meal.   montelukast 10 MG tablet Commonly known as: SINGULAIR Take 10 mg by mouth every morning.   pravastatin 10 MG tablet Commonly known as: PRAVACHOL Take 1 tablet (10 mg total) by mouth daily.   senna 8.6 MG Tabs tablet Commonly known as: SENOKOT Take 2 tablets (17.2 mg total) by mouth at bedtime.   sertraline 50 MG tablet Commonly known as: ZOLOFT Take 50 mg by mouth daily.   zinc oxide 20 % ointment Apply 1 application  topically as needed (To buttocks after every incontinent epsiode and as needed for redness).        Review of Systems  Constitutional:  Negative for activity change and appetite change.  HENT:  Negative for sore throat and trouble swallowing.   Eyes:  Negative for visual disturbance.  Respiratory:  Negative for  cough, shortness of breath and wheezing.   Cardiovascular:  Negative for chest pain and leg swelling.  Gastrointestinal:  Negative for abdominal distention, abdominal pain and rectal pain.  Genitourinary:  Positive for dysuria. Negative for hematuria and vaginal bleeding.  Musculoskeletal:  Positive for gait problem.  Skin:  Negative for wound.  Neurological:  Positive for weakness. Negative for dizziness and headaches.  Psychiatric/Behavioral:  Positive for confusion and dysphoric mood. Negative for sleep disturbance. The patient is not nervous/anxious.     Immunization History  Administered Date(s) Administered   Fluad Quad(high Dose 65+) 05/03/2022   Influenza Split 03/19/2012, 03/19/2013   Influenza, High Dose Seasonal PF 04/09/2014, 03/22/2015, 03/24/2016, 03/29/2018, 04/16/2020   Influenza,inj,Quad PF,6+ Mos 04/09/2014, 03/22/2015, 03/24/2016   Influenza-Unspecified 03/19/2012, 03/19/2013, 04/09/2017, 03/24/2018, 04/16/2020   Moderna SARS-COV2 Booster Vaccination 05/16/2022   Moderna Sars-Covid-2 Vaccination 05/19/2021   PFIZER(Purple Top)SARS-COV-2 Vaccination 08/17/2019, 09/10/2019, 04/06/2020, 11/19/2020   Pfizer Covid-19 Vaccine Bivalent Booster 45yrs & up 11/18/2020   Pneumococcal Conjugate-13 11/05/2014   Pneumococcal Polysaccharide-23 07/10/2004, 12/12/2010, 05/02/2016   Pneumococcal-Unspecified 07/10/2004, 12/12/2010, 05/02/2016   Respiratory Syncytial Virus Vaccine,Recomb Aduvanted(Arexvy) 06/24/2022   Td 07/10/2009   Tdap 07/06/2022   Zoster Recombinant(Shingrix) 04/09/2017, 07/19/2017   Zoster, Live 07/10/2006   Pertinent  Health Maintenance Due  Topic Date Due   INFLUENZA VACCINE  02/08/2023   OPHTHALMOLOGY EXAM  03/18/2023   HEMOGLOBIN A1C  05/26/2023   FOOT EXAM  02/02/2024   DEXA SCAN  Completed      09/18/2020   12:44 PM 01/20/2022   12:54 PM 07/31/2022    9:44 AM 01/10/2023    3:48 PM 02/02/2023    2:47 PM  Fall Risk  Falls in the past year?  1 0 0 0   Was there an injury with Fall?  0 0 0 0  Fall Risk Category Calculator  1 0 0 0  Fall Risk Category (Retired)  Low     (RETIRED) Patient Fall Risk Level Low fall risk High fall risk     Patient at Risk for Falls Due to  History of fall(s);Impaired balance/gait;Impaired mobility History of fall(s);Impaired mobility;Impaired balance/gait No Fall Risks History of fall(s);Impaired balance/gait;Impaired mobility  Fall risk Follow up  Falls evaluation completed;Education provided;Falls prevention discussed Falls evaluation completed Falls evaluation completed Falls evaluation completed;Education provided;Falls prevention discussed  Functional Status Survey:    Vitals:   02/14/23 0928  BP: 120/70  Pulse: 73  Resp: 17  Temp: 97.6 F (36.4 C)  SpO2: 94%  Weight: 168 lb 14.4 oz (76.6 kg)  Height: 5\' 4"  (1.626 m)   Body mass index is 28.99 kg/m. Physical Exam Vitals reviewed.  Constitutional:      General: She is not in acute distress. HENT:     Head: Normocephalic.     Right Ear: There is no impacted cerumen.     Left Ear: There is no impacted cerumen.     Nose: Nose normal.     Mouth/Throat:     Mouth: Mucous membranes are moist.     Comments: Poor dentition Eyes:     General:        Right eye: No discharge.        Left eye: No discharge.  Cardiovascular:     Rate and Rhythm: Normal rate and regular rhythm.     Pulses: Normal pulses.     Heart sounds: Normal heart sounds.  Pulmonary:     Effort: Pulmonary effort is normal. No respiratory distress.     Breath sounds: Normal breath sounds. No wheezing.  Abdominal:     General: Bowel sounds are normal. There is no distension.     Palpations: Abdomen is soft.     Tenderness: There is no abdominal tenderness.  Musculoskeletal:     Cervical back: Neck supple.     Right lower leg: No edema.     Left lower leg: No edema.  Skin:    General: Skin is warm.     Capillary Refill: Capillary refill takes less than 2 seconds.   Neurological:     General: No focal deficit present.     Mental Status: She is alert and oriented to person, place, and time.     Motor: Weakness present.     Gait: Gait abnormal.     Comments: wheelchair  Psychiatric:        Mood and Affect: Mood normal.     Comments: Very pleasant, follows commands, alert to self/person/place     Labs reviewed: Recent Labs    06/12/22 0000 11/23/22 0000  NA 140 139  K 4.4 4.5  CL 105 107  CO2 24* 22  BUN 25* 21  CREATININE 1.2* 1.3*  CALCIUM 9.4 8.8   Recent Labs    06/12/22 0000  AST 14  ALT 12  ALKPHOS 122  ALBUMIN 4.2   Recent Labs    06/12/22 0000 11/23/22 0000  WBC 9.8 9.2  NEUTROABS  --  6,017.00  HGB 14.4 12.0  HCT 43 36  PLT 381 323   Lab Results  Component Value Date   TSH 3.100 08/12/2019   Lab Results  Component Value Date   HGBA1C 7.2 11/23/2022   Lab Results  Component Value Date   CHOL 191 06/12/2022   HDL 45 06/12/2022   LDLCALC 114 06/12/2022   TRIG 196 (A) 06/12/2022   CHOLHDL 4.4 12/19/2018    Significant Diagnostic Results in last 30 days:  No results found.  Assessment/Plan 1. Essential hypertension - controlled - cont lisinopril and metoprolol  2. Hyperlipidemia associated with type 2 diabetes mellitus (HCC) - LDL 114 - increase pravastatin to 20 mg daily - LDL goal < 70  3. Stage 3a chronic kidney disease (HCC) - encourage hydration with water - avoid NSAIDS  4. Type 2 diabetes mellitus with hyperglycemia, without long-term  current use of insulin (HCC) - A1c 7.2 - eye exam 03/2022 - some weight gain within past year> 10 lbs - metformin reduced due to kidney function - cont glipizide, asa, ACE, statin  5. Mild dementia without behavioral disturbance, psychotic disturbance, mood disturbance, or anxiety, unspecified dementia type (HCC) - behaviors towards husband earlier this month> does not recall - refused recent MMSE - recent BIMS 15/15 - not on medication  6.  Peripheral polyneuropathy - cont gabapentin  7. Painful micturition - no recent episodes - evaluated by urology in past - cont weekly diflucan  8. Recurrent depression (HCC) - see above - cont zoloft    Family/ staff Communication: plan discussed with patient and nurse  Labs/tests ordered:  A1c 03/2023

## 2023-03-05 ENCOUNTER — Non-Acute Institutional Stay (SKILLED_NURSING_FACILITY): Payer: Medicare PPO | Admitting: Orthopedic Surgery

## 2023-03-05 ENCOUNTER — Encounter: Payer: Self-pay | Admitting: Orthopedic Surgery

## 2023-03-05 DIAGNOSIS — R300 Dysuria: Secondary | ICD-10-CM | POA: Diagnosis not present

## 2023-03-05 DIAGNOSIS — K649 Unspecified hemorrhoids: Secondary | ICD-10-CM

## 2023-03-05 NOTE — Progress Notes (Unsigned)
Location:  [facility] Room Number: 26/A Place of Service:  SNF [phone]) Provider:  Octavia Heir, NP   [name], MD  Patient Care Team: [name], MD as PCP - General (Internal Medicine) Ernesto Rutherford, MD (Ophthalmology) Claud Kelp, MD as Consulting Physician (General Surgery) Carlus Pavlov, MD as Consulting Physician (Internal Medicine)  Extended Emergency Contact Information Primary Emergency Contact: [name],[name] Address: [address]          [place] [address] [place] Home Phone: [phone] Mobile Phone: [phone] Relation: Spouse  Code Status:  DNR Goals of care: Advanced Directive information    02/14/2023    9:37 AM  Advanced Directives  Does Patient Have a Medical Advance Directive? Yes  Type of Estate agent of [place];Living will;Out of facility DNR (pink MOST or yellow form)  Does patient want to make changes to medical advance directive? No - Patient declined  Copy of Healthcare Power of Attorney in Chart? Yes - validated most recent copy scanned in chart (See row information)     Chief Complaint  Patient presents with   Acute Visit    Dysuria and hemorrhoids    HPI:  Pt is a 82 y.o. female seen today for acute visit due to dysuria and hemorrhoids.   She currently resides on the skilled nursing unit at [facility]. PMH: HTN, HLD, GERD, IBS, T2DM, polyneuropathy, DJD, osteopenia, Factor 5 mutation, breast cancer s/p  left lumpectomy 1996, incontinence, and anxiety.    H/o painful micturition. She has been evaluated by urology. Past urine cultures negative for growth, but have confirmed yeast. CT abdomen/pelvis negative for hydronephrosis, calcili and neoplasm. Symptoms have improved with weekly diflucan. Clotrimazole 1% cream ordered by urology, no recent administration. She reports increased dysuria x 3 days. Denies hematuria or dryness. Afebrile. Vitals stable.   07/15  increased external hemorrhoids. She was prescribed Anusol rectal cream x7 days and symptoms resolved. She is now having increased pain to internal rectum x 3 days. H/o internal hemorrhoids per patient. She has Anusol suppositories on her med list , but not administered. She is also on senna. Denies hard stool.  Past Medical History:  Diagnosis Date   Allergy    [name]; allergy shots weekly.     Anxiety    Arthritis    DDD lumbar spine.     Cancer (HCC)    breast L x 2; skin basal cell carcinoma [name]).   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   Past Surgical History:  Procedure Laterality Date   BREAST LUMPECTOMY  1996   L breast cancer   BREAST SURGERY N/A    [employer]rees[employer] 12/12/2019   EYE SURGERY N/A    [employer] 12/12/2019   MASTECTOMY  2002   Bilateral for breast cancer   VAGINAL HYSTERECTOMY  1985   DUB; uterine fibroids; ovaries intact.    Allergies  Allergen Reactions   Fruit & Vegetable Daily [Nutritional Supplements] Diarrhea   Mixed Ragweed    Onion Other (See Comments)    indigestion   Tree Extract Swelling   Sulfa Drugs Cross Reactors Rash    All over the body    Outpatient Encounter Medications as of 03/05/2023  Medication Sig  acetaminophen (TYLENOL) 500 MG tablet Take 1,000 mg by mouth every 12 (twelve) hours as needed.   acetaminophen (TYLENOL) 500 MG tablet Take 1,000 mg by mouth every evening.   albuterol (VENTOLIN HFA) 108 (90 Base) MCG/ACT inhaler Inhale 2 puffs into the lungs every 6 (six) hours as needed for wheezing or shortness of breath.   antiseptic oral rinse (BIOTENE) LIQD 30 mLs by Mouth Rinse route 2 (two) times daily. For dry mouth   aspirin 81 MG chewable tablet Chew by mouth daily.   calcium carbonate (TUMS) 500 MG chewable tablet Chew  1-2 tablets (200-400 mg of elemental calcium total) by mouth 3 (three) times daily as needed for indigestion or heartburn.   clotrimazole (GYNE-LOTRIMIN) 1 % vaginal cream Place 1 Applicatorful vaginally as needed.   fluconazole (DIFLUCAN) 150 MG tablet Take 150 mg by mouth as directed. Weekly on Tuesdays at bedtime.   fluticasone (FLONASE) 50 MCG/ACT nasal spray Place 1 spray into both nostrils as needed for allergies or rhinitis.   gabapentin (NEURONTIN) 100 MG capsule Take 200 mg by mouth 2 (two) times daily.   glipiZIDE (GLUCOTROL XL) 10 MG 24 hr tablet Take 1 tablet (10 mg total) by mouth daily with breakfast.   Hydrocortisone Acetate (ANUSOL-HC RE) Place 1 application  rectally as needed.   Ketotifen Fumarate (EYE ITCH RELIEF OP) Place 1 drop into both eyes every 12 (twelve) hours as needed.   lisinopril (ZESTRIL) 2.5 MG tablet Take 2.5 mg by mouth in the morning.   loperamide (IMODIUM A-D) 2 MG tablet Take 2 mg by mouth every 12 (twelve) hours as needed for diarrhea or loose stools.   Melatonin 5 MG CAPS Take 1 capsule by mouth daily. As needed for insomnia   metFORMIN (GLUCOPHAGE) 500 MG tablet Take 250 mg by mouth every evening.   metFORMIN (GLUCOPHAGE) 500 MG tablet Take 500 mg by mouth every morning.   metoprolol succinate (TOPROL-XL) 100 MG 24 hr tablet Take 100 mg by mouth 2 (two) times daily. Take with or immediately following a meal.   montelukast (SINGULAIR) 10 MG tablet Take 10 mg by mouth every morning.   pravastatin (PRAVACHOL) 10 MG tablet Take 2 tablets (20 mg total) by mouth daily.   senna (SENOKOT) 8.6 MG TABS tablet Take 2 tablets (17.2 mg total) by mouth at bedtime.   sertraline (ZOLOFT) 50 MG tablet Take 50 mg by mouth daily.   zinc oxide 20 % ointment Apply 1 application  topically as needed (To buttocks after every incontinent epsiode and as needed for redness).   No facility-administered encounter medications on file as of 03/05/2023.    Review of Systems   Constitutional:  Negative for activity change and fever.  Respiratory:  Negative for cough, shortness of breath and wheezing.   Cardiovascular:  Negative for chest pain and leg swelling.  Gastrointestinal:  Positive for rectal pain. Negative for abdominal pain, anal bleeding and blood in stool.  Genitourinary:  Positive for dysuria. Negative for hematuria.  Psychiatric/Behavioral:  Positive for dysphoric mood. Negative for sleep disturbance. The patient is not nervous/anxious.     Immunization History  Administered Date(s) Administered   Fluad Quad(high Dose 65+) 05/03/2022   Influenza Split 03/19/2012, 03/19/2013   Influenza, High Dose Seasonal PF 04/09/2014, 03/22/2015, 03/24/2016, 03/29/2018, 04/16/2020   Influenza,inj,Quad PF,6+ Mos 04/09/2014, 03/22/2015, 03/24/2016   Influenza-Unspecified 03/19/2012, 03/19/2013, 04/09/2017, 03/24/2018, 04/16/2020   Moderna SARS-COV2 Booster Vaccination 05/16/2022   Moderna Sars-Covid-2 Vaccination 05/19/2021   PFIZER(Purple Top)SARS-COV-2 Vaccination 08/17/2019, 09/10/2019,  04/06/2020, 11/19/2020   Pfizer Covid-19 Vaccine Bivalent Booster 97yrs & up 11/18/2020   Pneumococcal Conjugate-13 11/05/2014   Pneumococcal Polysaccharide-23 07/10/2004, 12/12/2010, 05/02/2016   Pneumococcal-Unspecified 07/10/2004, 12/12/2010, 05/02/2016   Respiratory Syncytial Virus Vaccine,Recomb Aduvanted(Arexvy) 06/24/2022   Td 07/10/2009   Tdap 07/06/2022   Zoster Recombinant(Shingrix) 04/09/2017, 07/19/2017   Zoster, Live 07/10/2006   Pertinent  Health Maintenance Due  Topic Date Due   INFLUENZA VACCINE  02/08/2023   OPHTHALMOLOGY EXAM  03/18/2023   HEMOGLOBIN A1C  05/26/2023   FOOT EXAM  02/02/2024   DEXA SCAN  Completed      09/18/2020   12:44 PM 01/20/2022   12:54 PM 07/31/2022    9:44 AM 01/10/2023    3:48 PM 02/02/2023    2:47 PM  Fall Risk  Falls in the past year?  1 0 0 0  Was there an injury with Fall?  0 0 0 0  Fall Risk Category Calculator  1 0 0 0   Fall Risk Category (Retired)  Low     (RETIRED) Patient Fall Risk Level Low fall risk High fall risk     Patient at Risk for Falls Due to  History of fall(s);Impaired balance/gait;Impaired mobility History of fall(s);Impaired mobility;Impaired balance/gait No Fall Risks History of fall(s);Impaired balance/gait;Impaired mobility  Fall risk Follow up  Falls evaluation completed;Education provided;Falls prevention discussed Falls evaluation completed Falls evaluation completed Falls evaluation completed;Education provided;Falls prevention discussed   Functional Status Survey:    Vitals:   03/05/23 1811  BP: 124/79  Pulse: 93  Resp: 18  Temp: (!) 97.3 F (36.3 C)  SpO2: 98%  Weight: 168 lb 14.4 oz (76.6 kg)  Height: 5\' 4"  (1.626 m)   Body mass index is 28.99 kg/m. Physical Exam Vitals reviewed.  Constitutional:      General: She is not in acute distress. HENT:     Head: Normocephalic.  Eyes:     General:        Right eye: No discharge.        Left eye: No discharge.  Cardiovascular:     Rate and Rhythm: Normal rate and regular rhythm.     Pulses: Normal pulses.     Heart sounds:     Gallop present.  Pulmonary:     Effort: Pulmonary effort is normal. No respiratory distress.     Breath sounds: Normal breath sounds. No wheezing.  Abdominal:     General: Bowel sounds are normal. There is no distension.     Palpations: Abdomen is soft.     Tenderness: There is no abdominal tenderness.  Musculoskeletal:     Cervical back: Neck supple.     Right lower leg: No edema.     Left lower leg: No edema.  Skin:    General: Skin is warm.     Capillary Refill: Capillary refill takes less than 2 seconds.  Neurological:     General: No focal deficit present.     Mental Status: She is alert and oriented to person, place, and time.     Motor: Weakness present.     Gait: Gait abnormal.  Psychiatric:        Mood and Affect: Mood normal.     Labs reviewed: Recent Labs     06/12/22 0000 11/23/22 0000  NA 140 139  K 4.4 4.5  CL 105 107  CO2 24* 22  BUN 25* 21  CREATININE 1.2* 1.3*  CALCIUM 9.4 8.8   Recent Labs  06/12/22 0000  AST 14  ALT 12  ALKPHOS 122  ALBUMIN 4.2   Recent Labs    06/12/22 0000 11/23/22 0000  WBC 9.8 9.2  NEUTROABS  --  6,017.00  HGB 14.4 12.0  HCT 43 36  PLT 381 323   Lab Results  Component Value Date   TSH 3.100 08/12/2019   Lab Results  Component Value Date   HGBA1C 7.2 11/23/2022   Lab Results  Component Value Date   CHOL 191 06/12/2022   HDL 45 06/12/2022   LDLCALC 114 06/12/2022   TRIG 196 (A) 06/12/2022   CHOLHDL 4.4 12/19/2018    Significant Diagnostic Results in last 30 days:  No results found.  Assessment/Plan 1. Dysuria - ongoing - evaluated by urology - past cultures no growth, indicated yeast - clotrimazole 1% cream to vaginal area daily prn ordered per urology> not recently administered - cont weekly diflucan - UA/culture - start pyridium 200 mg TID x 3 days> given after urine collection - advised to avoid carbonated drinks and drink water - consider estrace if symptoms continue  2. Bleeding hemorrhoid - increased rectal pain x 3 days - h/o external hemorrhoids 07/15 - no recent administration of Anusol suppository> give one dose now - cont senna - discussed drinking more water - avoid straining   Family/ staff Communication: plan discussed with patient and nurse  Labs/tests ordered:  UA/culture

## 2023-03-06 MED ORDER — PHENAZOPYRIDINE HCL 200 MG PO TABS
200.0000 mg | ORAL_TABLET | Freq: Three times a day (TID) | ORAL | Status: AC
Start: 2023-03-06 — End: 2023-03-09

## 2023-03-14 ENCOUNTER — Encounter: Payer: Self-pay | Admitting: Orthopedic Surgery

## 2023-03-14 ENCOUNTER — Non-Acute Institutional Stay (SKILLED_NURSING_FACILITY): Payer: Medicare PPO | Admitting: Orthopedic Surgery

## 2023-03-14 DIAGNOSIS — E1169 Type 2 diabetes mellitus with other specified complication: Secondary | ICD-10-CM | POA: Diagnosis not present

## 2023-03-14 DIAGNOSIS — R309 Painful micturition, unspecified: Secondary | ICD-10-CM | POA: Diagnosis not present

## 2023-03-14 DIAGNOSIS — E1165 Type 2 diabetes mellitus with hyperglycemia: Secondary | ICD-10-CM

## 2023-03-14 DIAGNOSIS — N1832 Chronic kidney disease, stage 3b: Secondary | ICD-10-CM | POA: Diagnosis not present

## 2023-03-14 DIAGNOSIS — G629 Polyneuropathy, unspecified: Secondary | ICD-10-CM

## 2023-03-14 DIAGNOSIS — F03B0 Unspecified dementia, moderate, without behavioral disturbance, psychotic disturbance, mood disturbance, and anxiety: Secondary | ICD-10-CM

## 2023-03-14 DIAGNOSIS — I10 Essential (primary) hypertension: Secondary | ICD-10-CM

## 2023-03-14 DIAGNOSIS — E785 Hyperlipidemia, unspecified: Secondary | ICD-10-CM

## 2023-03-14 DIAGNOSIS — F339 Major depressive disorder, recurrent, unspecified: Secondary | ICD-10-CM

## 2023-03-14 NOTE — Progress Notes (Addendum)
Location:   [facility]  Nursing Home Room Number: 26-A Place of Service:  SNF [phone]) Provider:  Hazle Nordmann, NP  PCP: [name], MD  Patient Care Team: [name], MD as PCP - General (Internal Medicine) Ernesto Rutherford, MD (Ophthalmology) Claud Kelp, MD as Consulting Physician (General Surgery) Carlus Pavlov, MD as Consulting Physician (Internal Medicine)  Extended Emergency Contact Information Primary Emergency Contact: [name],[name] Address: [address]          [place] [address] [place] Home Phone: [phone] Mobile Phone: [phone] Relation: Spouse  Code Status:  DNR Goals of care: Advanced Directive information    03/14/2023   10:04 AM  Advanced Directives  Does Patient Have a Medical Advance Directive? Yes  Type of Estate agent of [place];Living will;Out of facility DNR (pink MOST or yellow form)  Does patient want to make changes to medical advance directive? No - Patient declined  Copy of Healthcare Power of Attorney in Chart? Yes - validated most recent copy scanned in chart (See row information)     Chief Complaint  Patient presents with   Medical Management of Chronic Issues    Routine Visit.    Immunizations    Discuss the need for Influenza vaccine, and Covid Booster.     HPI:  Pt is a 82 y.o. female seen today for medical management of chronic diseases.    She currently resides on the skilled nursing unit at [facility]. PMH: HTN, HLD, GERD, IBS, T2DM, polyneuropathy, DJD, osteopenia, Factor 5 mutation, breast cancer s/p  left lumpectomy 1996, incontinence, and anxiety.    Followed by palliative. Hospice discharged last year.    Painful micturition- increased pain when urinating, CT abdomen/pelvis negative for hydronephrosis/calculi/neoplasm, recent urine culture negative for growth> past cultures grew yeast, remains on weekly diflucan and clotrimazole 1% daily prn> symptoms  improve when she asks for cream HTN- BUN/creat 21/1.28, GFR 42 (05/16) , remains on metoprolol and lisinopril HLD- on pravastatin CKD- see above  T2DM- A1c 7.2 (05/16)> was 7.0 (12/04)>, urine microalbumin 151 12/13/2021, no hypoglycemic events, blood sugars 180-240's, metformin recently reduced/ glipizide increased due to declining renal function, off Invokana due to frequent genitourinary infections, last eye exam 03/2022 Dementia-  MMSE 25/30 2021, BIMS score 15/15 (06/24)> was 15/15 (03/21), diagnosed with neurodegenerative disorder per neurology, ambulates with w/c, behaviors towards husband within past month, not on medication Peripheral neuropathy- remains on gabapentin Depression- Na+ 139 11/23/2022,remains on Zoloft   No recent falls or injuries.   Recent weights   08/01- 168.9 lbs  07/01- 167.5 lbs  06/01- 171 lbs  Recent blood pressures:  09/03- 120/74  08/27- 136/73  08/20- 124/79     Past Medical History:  Diagnosis Date   Allergy    [name]; allergy shots weekly.     Anxiety    Arthritis    DDD lumbar spine.     Cancer (HCC)    breast L x 2; skin basal cell carcinoma [name]).   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   Past Surgical History:  Procedure Laterality Date   BREAST LUMPECTOMY  1996   L breast cancer   BREAST SURGERY N/A    [employer]reesia 12/12/2019   EYE  SURGERY N/A    [employer]reesia 12/12/2019   MASTECTOMY  2002   Bilateral for breast cancer   VAGINAL HYSTERECTOMY  1985   DUB; uterine fibroids; ovaries intact.    Allergies  Allergen Reactions   Fruit & Vegetable Daily [Nutritional Supplements] Diarrhea   Mixed Ragweed    Onion Other (See Comments)    indigestion   Tree Extract Swelling   Sulfa Drugs Cross Reactors Rash    All over the body     Allergies as of 03/14/2023       Reactions   Fruit & Vegetable Daily [nutritional Supplements] Diarrhea   Mixed Ragweed    Onion Other (See Comments)   indigestion   Tree Extract Swelling   Sulfa Drugs Cross Reactors Rash   All over the body        Medication List        Accurate as of March 14, 2023 10:05 AM. If you have any questions, ask your nurse or doctor.          acetaminophen 500 MG tablet Commonly known as: TYLENOL Take 1,000 mg by mouth every 12 (twelve) hours as needed.   acetaminophen 500 MG tablet Commonly known as: TYLENOL Take 1,000 mg by mouth every evening.   albuterol 108 (90 Base) MCG/ACT inhaler Commonly known as: VENTOLIN HFA Inhale 2 puffs into the lungs every 6 (six) hours as needed for wheezing or shortness of breath.   antiseptic oral rinse Liqd 30 mLs by Mouth Rinse route 2 (two) times daily. For dry mouth   ANUSOL-HC RE Place 1 application  rectally as needed.   aspirin 81 MG chewable tablet Chew by mouth daily.   calcium carbonate 500 MG chewable tablet Commonly known as: Tums Chew 1-2 tablets (200-400 mg of elemental calcium total) by mouth 3 (three) times daily as needed for indigestion or heartburn.   clotrimazole 1 % vaginal cream Commonly known as: GYNE-LOTRIMIN Place 1 Applicatorful vaginally as needed.   EYE ITCH RELIEF OP Place 1 drop into both eyes every 12 (twelve) hours as needed.   fluconazole 150 MG tablet Commonly known as: DIFLUCAN Take 150 mg by mouth as directed. Weekly on Tuesdays at bedtime.   fluticasone 50 MCG/ACT nasal spray Commonly known as: FLONASE Place 1 spray into both nostrils as needed for allergies or rhinitis.   gabapentin 100 MG capsule Commonly known as: NEURONTIN Take 200 mg by mouth 2 (two) times daily.   glipiZIDE 10 MG 24 hr tablet Commonly known as: GLUCOTROL XL Take 1 tablet (10 mg total) by mouth daily with breakfast.   lisinopril 2.5 MG tablet Commonly known as:  ZESTRIL Take 2.5 mg by mouth in the morning.   loperamide 2 MG tablet Commonly known as: IMODIUM A-D Take 2 mg by mouth every 12 (twelve) hours as needed for diarrhea or loose stools.   Melatonin 5 MG Caps Take 1 capsule by mouth daily. As needed for insomnia   metFORMIN 500 MG tablet Commonly known as: GLUCOPHAGE Take 250 mg by mouth every evening.   metFORMIN 500 MG tablet Commonly known as: GLUCOPHAGE Take 500 mg by mouth every morning.   metoprolol succinate 100 MG 24 hr tablet Commonly known as: TOPROL-XL Take 100 mg by mouth 2 (two) times daily. Take with or immediately following a meal.   montelukast 10 MG tablet Commonly known as: SINGULAIR Take 10 mg by mouth every morning.   pravastatin 20 MG tablet Commonly known as: PRAVACHOL Take 20 mg by  mouth every evening. What changed: Another medication with the same name was removed. Continue taking this medication, and follow the directions you see here. Changed by: Octavia Heir   senna 8.6 MG Tabs tablet Commonly known as: SENOKOT Take 2 tablets (17.2 mg total) by mouth at bedtime.   sertraline 50 MG tablet Commonly known as: ZOLOFT Take 50 mg by mouth daily.   zinc oxide 20 % ointment Apply 1 application  topically as needed (To buttocks after every incontinent epsiode and as needed for redness).        Review of Systems  Constitutional:  Negative for activity change and appetite change.  HENT:  Negative for sore throat and trouble swallowing.   Eyes:  Negative for visual disturbance.  Respiratory:  Negative for cough, shortness of breath and wheezing.   Cardiovascular:  Negative for chest pain and leg swelling.  Gastrointestinal:  Negative for abdominal distention and abdominal pain.       Hemorrhoids  Genitourinary:  Positive for dysuria. Negative for frequency, hematuria, vaginal bleeding and vaginal discharge.  Musculoskeletal:  Positive for gait problem.  Skin:  Negative for wound.  Neurological:   Positive for weakness. Negative for dizziness and headaches.  Psychiatric/Behavioral:  Positive for confusion. Negative for dysphoric mood and sleep disturbance. The patient is not nervous/anxious.     Immunization History  Administered Date(s) Administered   Fluad Quad(high Dose 65+) 05/03/2022   Influenza Split 03/19/2012, 03/19/2013   Influenza, High Dose Seasonal PF 04/09/2014, 03/22/2015, 03/24/2016, 03/29/2018, 04/16/2020   Influenza,inj,Quad PF,6+ Mos 04/09/2014, 03/22/2015, 03/24/2016   Influenza-Unspecified 03/19/2012, 03/19/2013, 04/09/2017, 03/24/2018, 04/16/2020   Moderna SARS-COV2 Booster Vaccination 05/16/2022   Moderna Sars-Covid-2 Vaccination 05/19/2021   PFIZER(Purple Top)SARS-COV-2 Vaccination 08/17/2019, 09/10/2019, 04/06/2020, 11/19/2020   Pfizer Covid-19 Vaccine Bivalent Booster 58yrs & up 11/18/2020   Pneumococcal Conjugate-13 11/05/2014   Pneumococcal Polysaccharide-23 07/10/2004, 12/12/2010, 05/02/2016   Pneumococcal-Unspecified 07/10/2004, 12/12/2010, 05/02/2016   Respiratory Syncytial Virus Vaccine,Recomb Aduvanted(Arexvy) 06/24/2022   Td 07/10/2009   Tdap 07/06/2022   Zoster Recombinant(Shingrix) 04/09/2017, 07/19/2017   Zoster, Live 07/10/2006   Pertinent  Health Maintenance Due  Topic Date Due   INFLUENZA VACCINE  02/08/2023   OPHTHALMOLOGY EXAM  03/18/2023   HEMOGLOBIN A1C  05/26/2023   FOOT EXAM  02/02/2024   DEXA SCAN  Completed      09/18/2020   12:44 PM 01/20/2022   12:54 PM 07/31/2022    9:44 AM 01/10/2023    3:48 PM 02/02/2023    2:47 PM  Fall Risk  Falls in the past year?  1 0 0 0  Was there an injury with Fall?  0 0 0 0  Fall Risk Category Calculator  1 0 0 0  Fall Risk Category (Retired)  Low     (RETIRED) Patient Fall Risk Level Low fall risk High fall risk     Patient at Risk for Falls Due to  History of fall(s);Impaired balance/gait;Impaired mobility History of fall(s);Impaired mobility;Impaired balance/gait No Fall Risks History of  fall(s);Impaired balance/gait;Impaired mobility  Fall risk Follow up  Falls evaluation completed;Education provided;Falls prevention discussed Falls evaluation completed Falls evaluation completed Falls evaluation completed;Education provided;Falls prevention discussed   Functional Status Survey:    Vitals:   03/14/23 0931  BP: 120/74  Pulse: 72  Resp: 18  Temp: (!) 96.1 F (35.6 C)  SpO2: 95%  Weight: 168 lb 14.4 oz (76.6 kg)  Height: 5\' 4"  (1.626 m)   Body mass index is 28.99 kg/m. Physical Exam Vitals reviewed.  Constitutional:      General: She is not in acute distress. HENT:     Head: Normocephalic.     Right Ear: There is no impacted cerumen.     Left Ear: There is no impacted cerumen.     Nose: Nose normal.     Mouth/Throat:     Mouth: Mucous membranes are moist.  Eyes:     General:        Right eye: No discharge.        Left eye: No discharge.  Cardiovascular:     Rate and Rhythm: Normal rate and regular rhythm.     Pulses: Normal pulses.     Heart sounds: Normal heart sounds.  Pulmonary:     Effort: Pulmonary effort is normal. No respiratory distress.     Breath sounds: Normal breath sounds. No wheezing.  Abdominal:     General: Bowel sounds are normal. There is no distension.     Palpations: Abdomen is soft.     Tenderness: There is no abdominal tenderness.  Musculoskeletal:     Cervical back: Neck supple.     Right lower leg: No edema.     Left lower leg: No edema.  Skin:    General: Skin is warm.     Capillary Refill: Capillary refill takes less than 2 seconds.  Neurological:     General: No focal deficit present.     Mental Status: She is alert and oriented to person, place, and time.     Motor: Weakness present.     Gait: Gait abnormal.  Psychiatric:        Mood and Affect: Mood normal.     Labs reviewed: Recent Labs    06/12/22 0000 11/23/22 0000  NA 140 139  K 4.4 4.5  CL 105 107  CO2 24* 22  BUN 25* 21  CREATININE 1.2* 1.3*   CALCIUM 9.4 8.8   Recent Labs    06/12/22 0000  AST 14  ALT 12  ALKPHOS 122  ALBUMIN 4.2   Recent Labs    06/12/22 0000 11/23/22 0000  WBC 9.8 9.2  NEUTROABS  --  6,017.00  HGB 14.4 12.0  HCT 43 36  PLT 381 323   Lab Results  Component Value Date   TSH 3.100 08/12/2019   Lab Results  Component Value Date   HGBA1C 7.2 11/23/2022   Lab Results  Component Value Date   CHOL 191 06/12/2022   HDL 45 06/12/2022   LDLCALC 114 06/12/2022   TRIG 196 (A) 06/12/2022   CHOLHDL 4.4 12/19/2018    Significant Diagnostic Results in last 30 days:  No results found.  Assessment/Plan 1. Painful micturition - ongoing> past urology evaluation 03/2022 - recent urine culture negative for growth, other cultures grew yeast - clotrimazole 1% vaginal cream was not being used> symptoms improved with use - cont Weekly diflucan - hold off on estrace for now  2. Essential hypertension - controlled with lisinopril and metoprolol  3. Hyperlipidemia associated with type 2 diabetes mellitus (HCC) - LDL 114, goal < 70 - cont pravastatin  4. Stage 3b chronic kidney disease (HCC) - encourage hydration with NSAIDS - avoid NSAIDS  5. Type 2 diabetes mellitus with hyperglycemia, without long-term current use of insulin (HCC) - A1c 7.2> 8.7 03/15/2023> plan to recheck GFR 09/09 > if improved consider increasing metformin> if it remains low will increase glipizide to 15 mg  - do not want to start carb mod diet due  to quality of life  - metformin reduced due to kidney function - recent blood sugars increased> 180-240's - about 10 lb weight gain within past year - eye exam 03/2022 - needs urine microalbumin - cont glipizide, asa, ACE, statin  6. Moderate dementia without behavioral disturbance, psychotic disturbance, mood disturbance, or anxiety, unspecified dementia type (HCC) - no behaviors - dependent with some ADLs  - not on medication - ambulates with wheelchair - cont skilled  nursing  7. Peripheral polyneuropathy - cont gabapentin  8. Recurrent depression (HCC) - no mood changes - Na+ stable - cont Zoloft    Family/ staff Communication: plan discussed with patient and nurse  Labs/tests ordered:  A1c, urine microalbumin, needs diabetic eye exam

## 2023-03-15 LAB — HEMOGLOBIN A1C: Hemoglobin A1C: 8.7

## 2023-03-21 ENCOUNTER — Non-Acute Institutional Stay (SKILLED_NURSING_FACILITY): Payer: Medicare PPO | Admitting: Orthopedic Surgery

## 2023-03-21 ENCOUNTER — Other Ambulatory Visit: Payer: Self-pay | Admitting: Orthopedic Surgery

## 2023-03-21 ENCOUNTER — Encounter: Payer: Self-pay | Admitting: Orthopedic Surgery

## 2023-03-21 DIAGNOSIS — N1832 Chronic kidney disease, stage 3b: Secondary | ICD-10-CM | POA: Diagnosis not present

## 2023-03-21 DIAGNOSIS — E1165 Type 2 diabetes mellitus with hyperglycemia: Secondary | ICD-10-CM | POA: Diagnosis not present

## 2023-03-21 MED ORDER — SITAGLIPTIN PHOSPHATE 50 MG PO TABS
50.0000 mg | ORAL_TABLET | Freq: Every day | ORAL | Status: DC
Start: 2023-03-21 — End: 2024-05-15

## 2023-03-21 NOTE — Progress Notes (Signed)
Location:  [facility] Room Number: 26/A Place of Service:  SNF [phone]) Provider:  Octavia Heir, NP   [name], MD  Patient Care Team: [name], MD as PCP - General (Internal Medicine) Ernesto Rutherford, MD (Ophthalmology) Claud Kelp, MD as Consulting Physician (General Surgery) Carlus Pavlov, MD as Consulting Physician (Internal Medicine)  Extended Emergency Contact Information Primary Emergency Contact: [name],[name] Address: [address]          [place] [address] [place] Home Phone: [phone] Mobile Phone: [phone] Relation: Spouse  Code Status:  DNR Goals of care: Advanced Directive information    03/14/2023   10:04 AM  Advanced Directives  Does Patient Have a Medical Advance Directive? Yes  Type of Estate agent of [place];Living will;Out of facility DNR (pink MOST or yellow form)  Does patient want to make changes to medical advance directive? No - Patient declined  Copy of Healthcare Power of Attorney in Chart? Yes - validated most recent copy scanned in chart (See row information)     Chief Complaint  Patient presents with   Acute Visit    Elevated A1c, hyperglycemia    HPI:  Pt is a 82 y.o. female seen today for acute visit due to elevated A1c.   She currently resides on the skilled nursing unit at [facility]. PMH: HTN, HLD, GERD, IBS, T2DM, polyneuropathy, DJD, osteopenia, Factor 5 mutation, breast cancer s/p  left lumpectomy 1996, incontinence, and anxiety.   Recent A1c 8.7> was 7.2. Metformin was decreased to 750 mg daily due to declining renal function. She is also on glipizide. Recent GFR 40 (09/09). No recent hypoglycemias. Sugars are averaging 170-200's. She admits to drinking sweet tea at lunch and dinner. She is reluctant to change diet to carb modified due to quality of life, husband agrees. She is willing to try unsweetened tea and splenda.   Off Invokana due to  genitourinary infections 2023.      Past Medical History:  Diagnosis Date   Allergy    [name]; allergy shots weekly.     Anxiety    Arthritis    DDD lumbar spine.     Cancer (HCC)    breast L x 2; skin basal cell carcinoma [name]).   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   Past Surgical History:  Procedure Laterality Date   BREAST LUMPECTOMY  1996   L breast cancer   BREAST SURGERY N/A    Phreesia 12/12/2019   EYE SURGERY N/A    Phreesia 12/12/2019   MASTECTOMY  2002   Bilateral for breast cancer   VAGINAL HYSTERECTOMY  1985   DUB; uterine fibroids; ovaries intact.    Allergies  Allergen Reactions   Fruit & Vegetable Daily [Nutritional Supplements] Diarrhea   Mixed Ragweed    Onion Other (See Comments)    indigestion   Tree Extract Swelling   Sulfa Drugs Cross Reactors Rash    All over the body    Outpatient Encounter Medications as of 03/21/2023  Medication Sig   acetaminophen (TYLENOL) 500 MG tablet Take 1,000 mg by mouth every 12 (twelve) hours as needed.   acetaminophen (TYLENOL) 500 MG tablet Take 1,000 mg by mouth every evening.  albuterol (VENTOLIN HFA) 108 (90 Base) MCG/ACT inhaler Inhale 2 puffs into the lungs every 6 (six) hours as needed for wheezing or shortness of breath.   antiseptic oral rinse (BIOTENE) LIQD 30 mLs by Mouth Rinse route 2 (two) times daily. For dry mouth   aspirin 81 MG chewable tablet Chew by mouth daily.   calcium carbonate (TUMS) 500 MG chewable tablet Chew 1-2 tablets (200-400 mg of elemental calcium total) by mouth 3 (three) times daily as needed for indigestion or heartburn.   clotrimazole (GYNE-LOTRIMIN) 1 % vaginal cream Place 1 Applicatorful vaginally as needed.   fluconazole (DIFLUCAN) 150 MG tablet Take 150 mg by mouth  as directed. Weekly on Tuesdays at bedtime.   fluticasone (FLONASE) 50 MCG/ACT nasal spray Place 1 spray into both nostrils as needed for allergies or rhinitis.   gabapentin (NEURONTIN) 100 MG capsule Take 200 mg by mouth 2 (two) times daily.   glipiZIDE (GLUCOTROL XL) 10 MG 24 hr tablet Take 1 tablet (10 mg total) by mouth daily with breakfast.   Hydrocortisone Acetate (ANUSOL-HC RE) Place 1 application  rectally as needed.   Ketotifen Fumarate (EYE ITCH RELIEF OP) Place 1 drop into both eyes every 12 (twelve) hours as needed.   lisinopril (ZESTRIL) 2.5 MG tablet Take 2.5 mg by mouth in the morning.   loperamide (IMODIUM A-D) 2 MG tablet Take 2 mg by mouth every 12 (twelve) hours as needed for diarrhea or loose stools.   Melatonin 5 MG CAPS Take 1 capsule by mouth daily. As needed for insomnia   metFORMIN (GLUCOPHAGE) 500 MG tablet Take 250 mg by mouth every evening.   metFORMIN (GLUCOPHAGE) 500 MG tablet Take 500 mg by mouth every morning.   metoprolol succinate (TOPROL-XL) 100 MG 24 hr tablet Take 100 mg by mouth 2 (two) times daily. Take with or immediately following a meal.   montelukast (SINGULAIR) 10 MG tablet Take 10 mg by mouth every morning.   pravastatin (PRAVACHOL) 20 MG tablet Take 20 mg by mouth every evening.   senna (SENOKOT) 8.6 MG TABS tablet Take 2 tablets (17.2 mg total) by mouth at bedtime.   sertraline (ZOLOFT) 50 MG tablet Take 50 mg by mouth daily.   zinc oxide 20 % ointment Apply 1 application  topically as needed (To buttocks after every incontinent epsiode and as needed for redness).   No facility-administered encounter medications on file as of 03/21/2023.    Review of Systems  Constitutional:  Negative for activity change and appetite change.  Respiratory:  Negative for cough and shortness of breath.   Cardiovascular:  Negative for chest pain and leg swelling.  Gastrointestinal:  Negative for abdominal distention and abdominal pain.  Genitourinary:  Positive for  dysuria.  Musculoskeletal:  Positive for gait problem.  Skin:  Negative for wound.  Neurological:  Positive for numbness.  Psychiatric/Behavioral:  Positive for confusion and dysphoric mood. Negative for sleep disturbance. The patient is not nervous/anxious.     Immunization History  Administered Date(s) Administered   Fluad Quad(high Dose 65+) 05/03/2022   Influenza Split 03/19/2012, 03/19/2013   Influenza, High Dose Seasonal PF 04/09/2014, 03/22/2015, 03/24/2016, 03/29/2018, 04/16/2020   Influenza,inj,Quad PF,6+ Mos 04/09/2014, 03/22/2015, 03/24/2016   Influenza-Unspecified 03/19/2012, 03/19/2013, 04/09/2017, 03/24/2018, 04/16/2020   Moderna SARS-COV2 Booster Vaccination 05/16/2022   Moderna Sars-Covid-2 Vaccination 05/19/2021   PFIZER(Purple Top)SARS-COV-2 Vaccination 08/17/2019, 09/10/2019, 04/06/2020, 11/19/2020   Pfizer Covid-19 Vaccine Bivalent Booster 60yrs & up 11/18/2020   Pneumococcal Conjugate-13 11/05/2014   Pneumococcal Polysaccharide-23 07/10/2004,  12/12/2010, 05/02/2016   Pneumococcal-Unspecified 07/10/2004, 12/12/2010, 05/02/2016   Respiratory Syncytial Virus Vaccine,Recomb Aduvanted(Arexvy) 06/24/2022   Td 07/10/2009   Tdap 07/06/2022   Zoster Recombinant(Shingrix) 04/09/2017, 07/19/2017   Zoster, Live 07/10/2006   Pertinent  Health Maintenance Due  Topic Date Due   INFLUENZA VACCINE  02/08/2023   OPHTHALMOLOGY EXAM  03/18/2023   HEMOGLOBIN A1C  05/26/2023   FOOT EXAM  03/13/2024   DEXA SCAN  Completed      01/20/2022   12:54 PM 07/31/2022    9:44 AM 01/10/2023    3:48 PM 02/02/2023    2:47 PM 03/14/2023    3:13 PM  Fall Risk  Falls in the past year? 1 0 0 0 0  Was there an injury with Fall? 0 0 0 0 0  Fall Risk Category Calculator 1 0 0 0 0  Fall Risk Category (Retired) Low      (RETIRED) Patient Fall Risk Level High fall risk      Patient at Risk for Falls Due to History of fall(s);Impaired balance/gait;Impaired mobility History of fall(s);Impaired  mobility;Impaired balance/gait No Fall Risks History of fall(s);Impaired balance/gait;Impaired mobility History of fall(s);Impaired balance/gait  Fall risk Follow up Falls evaluation completed;Education provided;Falls prevention discussed Falls evaluation completed Falls evaluation completed Falls evaluation completed;Education provided;Falls prevention discussed Falls evaluation completed;Education provided   Functional Status Survey:    Vitals:   03/21/23 1320  BP: 107/71  Pulse: 74  Resp: 16  Temp: (!) 97.1 F (36.2 C)  SpO2: 92%  Weight: 168 lb 14.4 oz (76.6 kg)  Height: 5\' 4"  (1.626 m)   Body mass index is 28.99 kg/m. Physical Exam Vitals reviewed.  Constitutional:      General: She is not in acute distress. HENT:     Head: Normocephalic.  Eyes:     General:        Right eye: No discharge.        Left eye: No discharge.  Cardiovascular:     Rate and Rhythm: Normal rate and regular rhythm.     Pulses: Normal pulses.     Heart sounds: Normal heart sounds.  Pulmonary:     Effort: Pulmonary effort is normal. No respiratory distress.     Breath sounds: Normal breath sounds. No wheezing.  Abdominal:     General: Bowel sounds are normal.  Musculoskeletal:     Cervical back: Neck supple.     Right lower leg: No edema.     Left lower leg: No edema.  Skin:    General: Skin is warm.     Capillary Refill: Capillary refill takes less than 2 seconds.  Neurological:     General: No focal deficit present.     Mental Status: She is alert. Mental status is at baseline.     Motor: Weakness present.     Gait: Gait abnormal.     Comments: wheelchair  Psychiatric:        Mood and Affect: Mood normal.     Labs reviewed: Recent Labs    06/12/22 0000 11/23/22 0000  NA 140 139  K 4.4 4.5  CL 105 107  CO2 24* 22  BUN 25* 21  CREATININE 1.2* 1.3*  CALCIUM 9.4 8.8   Recent Labs    06/12/22 0000  AST 14  ALT 12  ALKPHOS 122  ALBUMIN 4.2   Recent Labs     06/12/22 0000 11/23/22 0000  WBC 9.8 9.2  NEUTROABS  --  6,017.00  HGB 14.4 12.0  HCT 43 36  PLT 381 323   Lab Results  Component Value Date   TSH 3.100 08/12/2019   Lab Results  Component Value Date   HGBA1C 7.2 11/23/2022   Lab Results  Component Value Date   CHOL 191 06/12/2022   HDL 45 06/12/2022   LDLCALC 114 06/12/2022   TRIG 196 (A) 06/12/2022   CHOLHDL 4.4 12/19/2018    Significant Diagnostic Results in last 30 days:  No results found.  Assessment/Plan 1. Type 2 diabetes mellitus with hyperglycemia, without long-term current use of insulin (HCC) - recent A1c 8.7> was 7.2 - no hypoglycemias - sugars averaging 170-200's - metformin was reduced due to declining GFR> now 40 (09/09) - cont metformin and glipizide - start Januvia 50 mg po daily> renal dose> discussed with pharmacy - if no improvement consider Rybelsus - discussed stopping sweet tea and trying unsweetened with Splenda - needs yearly diabetic/annual eye exam - A1c in 3 months  2. Stage 3b chronic kidney disease (HCC) - BUN/creat 31/1.33, GFR 40 03/19/2023 - encourage hydration with water - avoid NSAIDS  - cont low dose lisinopril    Family/ staff Communication: plan discussed with patient and nurse  Labs/tests ordered:  A1c in 3 months

## 2023-03-22 LAB — MICROALBUMIN, URINE: Microalb, Ur: 118

## 2023-03-22 LAB — PROTEIN / CREATININE RATIO, URINE
Albumin, U: 17.1
Creatinine, Urine: 145

## 2023-04-12 ENCOUNTER — Encounter: Payer: Self-pay | Admitting: Internal Medicine

## 2023-04-12 ENCOUNTER — Non-Acute Institutional Stay (SKILLED_NURSING_FACILITY): Payer: Self-pay | Admitting: Internal Medicine

## 2023-04-12 DIAGNOSIS — N1832 Chronic kidney disease, stage 3b: Secondary | ICD-10-CM | POA: Diagnosis not present

## 2023-04-12 DIAGNOSIS — E1169 Type 2 diabetes mellitus with other specified complication: Secondary | ICD-10-CM

## 2023-04-12 DIAGNOSIS — E1165 Type 2 diabetes mellitus with hyperglycemia: Secondary | ICD-10-CM | POA: Diagnosis not present

## 2023-04-12 DIAGNOSIS — E785 Hyperlipidemia, unspecified: Secondary | ICD-10-CM

## 2023-04-12 DIAGNOSIS — F339 Major depressive disorder, recurrent, unspecified: Secondary | ICD-10-CM

## 2023-04-12 DIAGNOSIS — R309 Painful micturition, unspecified: Secondary | ICD-10-CM | POA: Diagnosis not present

## 2023-04-12 DIAGNOSIS — I10 Essential (primary) hypertension: Secondary | ICD-10-CM | POA: Diagnosis not present

## 2023-04-12 DIAGNOSIS — F03B0 Unspecified dementia, moderate, without behavioral disturbance, psychotic disturbance, mood disturbance, and anxiety: Secondary | ICD-10-CM

## 2023-04-12 DIAGNOSIS — G629 Polyneuropathy, unspecified: Secondary | ICD-10-CM

## 2023-04-12 NOTE — Progress Notes (Signed)
[facility]  [facility] (31)  Provider: [name]   Code Status: DNR Goals of Care:     03/14/2023   10:04 AM  Advanced Directives  Does Patient Have a Medical Advance Directive? Yes  Type of Estate agent of [place];Living will;Out of facility DNR (pink MOST or yellow form)  Does patient want to make changes to medical advance directive? No - Patient declined  Copy of Healthcare Power of Attorney in Chart? Yes - validated most recent copy scanned in chart (See row information)     Chief Complaint  Patient presents with   Care Management    HPI: Patient is a 82 y.o. female seen today for medical management of chronic diseases.     Lives in SNF in [facility]   Patient has a history of type 2 diabetes History of neurodegenerative dementia Seen Neurology in 03/21 MMSE 25/30 MRI Moderate atrophy. Mild progression of white matter changes most likely due to chronic microvascular ischemia No workup since then   Urinary Incontinence HTN Insomnia, And Neuropathy Chronic Candidiasis CT abdomen/pelvis negative for hydronephrosis, renal calculi, neoplasm     She  is stable.No new Nursing issues. No Behavior issues She denied any issue with her Micturition today  Her weight is stable Wheelchair dependent No Falls Wt Readings from Last 3 Encounters:  04/12/23 164 lb (74.4 kg)  03/21/23 168 lb 14.4 oz (76.6 kg)  03/14/23 168 lb 14.4 oz (76.6 kg)    Past Medical History:  Diagnosis Date   Allergy    [name]; allergy shots weekly.     Anxiety    Arthritis    DDD lumbar spine.     Cancer (HCC)    breast L x 2; skin basal cell carcinoma [name]).   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    Past Surgical  History:  Procedure Laterality Date   BREAST LUMPECTOMY  1996   L breast cancer   BREAST SURGERY N/A    Phreesia 12/12/2019   EYE SURGERY N/A    Phreesia 12/12/2019   MASTECTOMY  2002   Bilateral for breast cancer   VAGINAL HYSTERECTOMY  1985   DUB; uterine fibroids; ovaries intact.    Allergies  Allergen Reactions   Fruit & Vegetable Daily [Nutritional Supplements] Diarrhea   Mixed Ragweed    Onion Other (See Comments)    indigestion   Tree Extract Swelling   Sulfa Drugs Cross Reactors Rash    All over the body    Outpatient Encounter Medications as of 04/12/2023  Medication Sig   acetaminophen (TYLENOL) 500 MG tablet Take 1,000 mg by mouth every 12 (twelve) hours as needed.   acetaminophen (TYLENOL) 500 MG tablet Take 1,000 mg by mouth every evening.   albuterol (VENTOLIN HFA) 108 (90 Base) MCG/ACT inhaler Inhale 2 puffs into the lungs every 6 (six) hours as needed for wheezing or shortness of breath.   antiseptic oral rinse (BIOTENE) LIQD 30 mLs by Mouth Rinse route 2 (two) times daily. For dry mouth   aspirin 81 MG chewable tablet Chew by mouth daily.   calcium carbonate (TUMS) 500 MG chewable tablet Chew 1-2 tablets (200-400 mg of elemental calcium total) by mouth 3 (  three) times daily as needed for indigestion or heartburn.   clotrimazole (GYNE-LOTRIMIN) 1 % vaginal cream Place 1 Applicatorful vaginally as needed.   fluconazole (DIFLUCAN) 150 MG tablet Take 150 mg by mouth as directed. Weekly on Tuesdays at bedtime.   fluticasone (FLONASE) 50 MCG/ACT nasal spray Place 1 spray into both nostrils as needed for allergies or rhinitis.   gabapentin (NEURONTIN) 100 MG capsule Take 200 mg by mouth 2 (two) times daily.   glipiZIDE (GLUCOTROL XL) 10 MG 24 hr tablet Take 1 tablet (10 mg total) by mouth daily with breakfast.   Hydrocortisone Acetate (ANUSOL-HC RE) Place 1 application  rectally as needed.   Ketotifen Fumarate (EYE ITCH RELIEF OP) Place 1 drop into both eyes every 12  (twelve) hours as needed.   lisinopril (ZESTRIL) 2.5 MG tablet Take 2.5 mg by mouth in the morning.   loperamide (IMODIUM A-D) 2 MG tablet Take 2 mg by mouth every 12 (twelve) hours as needed for diarrhea or loose stools.   Melatonin 5 MG CAPS Take 1 capsule by mouth daily. As needed for insomnia   metFORMIN (GLUCOPHAGE) 500 MG tablet Take 250 mg by mouth every evening.   metFORMIN (GLUCOPHAGE) 500 MG tablet Take 500 mg by mouth every morning.   metoprolol succinate (TOPROL-XL) 100 MG 24 hr tablet Take 100 mg by mouth 2 (two) times daily. Take with or immediately following a meal.   montelukast (SINGULAIR) 10 MG tablet Take 10 mg by mouth every morning.   pravastatin (PRAVACHOL) 20 MG tablet Take 20 mg by mouth every evening.   senna (SENOKOT) 8.6 MG TABS tablet Take 2 tablets (17.2 mg total) by mouth at bedtime.   sertraline (ZOLOFT) 50 MG tablet Take 50 mg by mouth daily.   sitaGLIPtin (JANUVIA) 50 MG tablet Take 1 tablet (50 mg total) by mouth daily.   zinc oxide 20 % ointment Apply 1 application  topically as needed (To buttocks after every incontinent epsiode and as needed for redness).   No facility-administered encounter medications on file as of 04/12/2023.    Review of Systems:  Review of Systems  Constitutional:  Negative for activity change and appetite change.  HENT: Negative.    Respiratory:  Negative for cough and shortness of breath.   Cardiovascular:  Negative for leg swelling.  Gastrointestinal:  Negative for constipation.  Genitourinary: Negative.   Musculoskeletal:  Positive for gait problem. Negative for arthralgias and myalgias.  Skin: Negative.   Neurological:  Negative for dizziness and weakness.  Psychiatric/Behavioral:  Positive for confusion. Negative for dysphoric mood and sleep disturbance.     Health Maintenance  Topic Date Due   Diabetic kidney evaluation - Urine ACR  05/17/2019   INFLUENZA VACCINE  02/08/2023   COVID-19 Vaccine (7 - 2023-24 season)  03/11/2023   OPHTHALMOLOGY EXAM  03/18/2023   HEMOGLOBIN A1C  05/26/2023   Diabetic kidney evaluation - eGFR measurement  11/23/2023   Medicare Annual Wellness (AWV)  02/02/2024   FOOT EXAM  03/13/2024   DTaP/Tdap/Td (3 - Td or Tdap) 07/06/2032   Pneumonia Vaccine 4+ Years old  Completed   DEXA SCAN  Completed   Zoster Vaccines- Shingrix  Completed   HPV VACCINES  Aged Out    Physical Exam: Vitals:   04/12/23 1313  BP: 128/77  Pulse: (!) 18  Resp: 18  Temp: (!) 97.2 F (36.2 C)  Weight: 164 lb (74.4 kg)   Body mass index is 28.15 kg/m. Physical Exam Vitals reviewed.  Constitutional:  Appearance: Normal appearance.  HENT:     Head: Normocephalic.     Nose: Nose normal.     Mouth/Throat:     Mouth: Mucous membranes are moist.     Pharynx: Oropharynx is clear.  Eyes:     Pupils: Pupils are equal, round, and reactive to light.  Cardiovascular:     Rate and Rhythm: Normal rate and regular rhythm.     Pulses: Normal pulses.     Heart sounds: Normal heart sounds. No murmur heard. Pulmonary:     Effort: Pulmonary effort is normal.     Breath sounds: Normal breath sounds.  Abdominal:     General: Abdomen is flat. Bowel sounds are normal.     Palpations: Abdomen is soft.  Musculoskeletal:        General: No swelling.     Cervical back: Neck supple.  Skin:    General: Skin is warm.  Neurological:     General: No focal deficit present.     Mental Status: She is alert.  Psychiatric:        Mood and Affect: Mood normal.        Thought Content: Thought content normal.     Labs reviewed: Basic Metabolic Panel: Recent Labs    06/12/22 0000 11/23/22 0000  NA 140 139  K 4.4 4.5  CL 105 107  CO2 24* 22  BUN 25* 21  CREATININE 1.2* 1.3*  CALCIUM 9.4 8.8   Liver Function Tests: Recent Labs    06/12/22 0000  AST 14  ALT 12  ALKPHOS 122  ALBUMIN 4.2   No results for input(s): "LIPASE", "AMYLASE" in the last 8760 hours. No results for input(s):  "AMMONIA" in the last 8760 hours. CBC: Recent Labs    06/12/22 0000 11/23/22 0000  WBC 9.8 9.2  NEUTROABS  --  6,017.00  HGB 14.4 12.0  HCT 43 36  PLT 381 323   Lipid Panel: Recent Labs    06/12/22 0000  CHOL 191  HDL 45  LDLCALC 114  TRIG 161*   Lab Results  Component Value Date   HGBA1C 7.2 11/23/2022    Procedures since last visit: No results found.  Assessment/Plan 1. Type 2 diabetes mellitus with hyperglycemia, without long-term current use of insulin (HCC) Recent A1C was 8.7 Januvia added last month Also on Amaryl and Metformin CBGS are slowly coming down She and her husband are doing some Diet modification also Repeat A1C after Holidays 2. Stage 3b chronic kidney disease (HCC) Creat stable 3. Painful micturition ? Etiology Past work up with Urology was negative CT Scan negative They recommended Conservative management 4. Essential hypertension Lisinopril and Metoprolol 5. Hyperlipidemia associated with type 2 diabetes mellitus (HCC) statin  6. Moderate dementia without behavioral disturbance, psychotic disturbance, mood disturbance, or anxiety, unspecified dementia type (HCC) Needs SNF level of care  7. Peripheral polyneuropathy Gabapentin 8. Recurrent depression (HCC) Zoloft 9 Recurent Candidiasis On Low Dose of Diflucan which is helping  Labs/tests ordered:  Will Need follow up of her A1C and Lipid   [name], MD

## 2023-04-16 LAB — HM DIABETES EYE EXAM

## 2023-04-18 ENCOUNTER — Non-Acute Institutional Stay (SKILLED_NURSING_FACILITY): Payer: Medicare PPO | Admitting: Orthopedic Surgery

## 2023-04-18 ENCOUNTER — Encounter: Payer: Self-pay | Admitting: Orthopedic Surgery

## 2023-04-18 DIAGNOSIS — N3941 Urge incontinence: Secondary | ICD-10-CM | POA: Diagnosis not present

## 2023-04-18 DIAGNOSIS — F03A18 Unspecified dementia, mild, with other behavioral disturbance: Secondary | ICD-10-CM

## 2023-04-18 DIAGNOSIS — R2681 Unsteadiness on feet: Secondary | ICD-10-CM | POA: Diagnosis not present

## 2023-04-18 DIAGNOSIS — F339 Major depressive disorder, recurrent, unspecified: Secondary | ICD-10-CM | POA: Diagnosis not present

## 2023-04-18 MED ORDER — SERTRALINE HCL 50 MG PO TABS
75.0000 mg | ORAL_TABLET | Freq: Every day | ORAL | Status: DC
Start: 2023-04-18 — End: 2023-04-25

## 2023-04-18 NOTE — Progress Notes (Signed)
Location:  [facility] Room Number: 26/A Place of Service:  SNF [phone]) Provider:  Octavia Heir, NP   [name], MD  Patient Care Team: [name], MD as PCP - General (Internal Medicine) Ernesto Rutherford, MD (Ophthalmology) Claud Kelp, MD as Consulting Physician (General Surgery) Carlus Pavlov, MD as Consulting Physician (Internal Medicine)  Extended Emergency Contact Information Primary Emergency Contact: [name],[name] Address: [address]          [place] [address] [place] Home Phone: [phone] Mobile Phone: [phone] Relation: Spouse  Code Status:  DNR Goals of care: Advanced Directive information    03/14/2023   10:04 AM  Advanced Directives  Does Patient Have a Medical Advance Directive? Yes  Type of Estate agent of [place];Living will;Out of facility DNR (pink MOST or yellow form)  Does patient want to make changes to medical advance directive? No - Patient declined  Copy of Healthcare Power of Attorney in Chart? Yes - validated most recent copy scanned in chart (See row information)     Chief Complaint  Patient presents with   Acute Visit    Agitation/ depression    HPI:  Pt is a 82 y.o. female seen today for acute visit due to agitation and depression.   She currently resides on the skilled nursing unit at [facility]. PMH: HTN, HLD, GERD, IBS, T2DM, polyneuropathy, DJD, osteopenia, Factor 5 mutation, breast cancer s/p  left lumpectomy 1996, incontinence, and anxiety.   This morning she attempted to leave unit. She was trying to talk with laundry staff. Wander guard on her wheelchair set off alarm. Nursing tried to assist her to her room. She began to yell at staff  stating " I am in prison" and " Why can't I leave here." She also would not move her wheelchair. I was able to calm her down and assist her back to her room. She admits to increased depression. She forgets she is  taking Zoloft daily. She refuses antidepressant today. She has had more incidents of agitation with staff within last 6 months. 06/2021 she was followed by hospice and admitted to SNF. Since her stay, she has improved and discharged from Hospice services. Husband visits almost daily. She reports being angry with him for making her live in a nursing home.    Past Medical History:  Diagnosis Date   Allergy    [name]; allergy shots weekly.     Anxiety    Arthritis    DDD lumbar spine.     Cancer (HCC)    breast L x 2; skin basal cell carcinoma [name]).   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   Past Surgical History:  Procedure Laterality Date   BREAST LUMPECTOMY  1996   L breast cancer   BREAST SURGERY N/A    Phreesia 12/12/2019   EYE SURGERY N/A    Phreesia 12/12/2019   MASTECTOMY  2002   Bilateral for breast cancer   VAGINAL HYSTERECTOMY  1985   DUB; uterine fibroids; ovaries intact.    Allergies  Allergen Reactions   Fruit & Vegetable Daily [Nutritional Supplements] Diarrhea   Mixed Ragweed    Onion Other (See Comments)    indigestion  Tree Extract Swelling   Sulfa Drugs Cross Reactors Rash    All over the body    Outpatient Encounter Medications as of 04/18/2023  Medication Sig   acetaminophen (TYLENOL) 500 MG tablet Take 1,000 mg by mouth every 12 (twelve) hours as needed.   acetaminophen (TYLENOL) 500 MG tablet Take 1,000 mg by mouth every evening.   albuterol (VENTOLIN HFA) 108 (90 Base) MCG/ACT inhaler Inhale 2 puffs into the lungs every 6 (six) hours as needed for wheezing or shortness of breath.   antiseptic oral rinse (BIOTENE) LIQD 30 mLs by Mouth Rinse route 2 (two) times daily. For dry mouth   aspirin 81 MG chewable tablet Chew by mouth daily.   calcium  carbonate (TUMS) 500 MG chewable tablet Chew 1-2 tablets (200-400 mg of elemental calcium total) by mouth 3 (three) times daily as needed for indigestion or heartburn.   clotrimazole (GYNE-LOTRIMIN) 1 % vaginal cream Place 1 Applicatorful vaginally as needed.   fluconazole (DIFLUCAN) 150 MG tablet Take 150 mg by mouth as directed. Weekly on Tuesdays at bedtime.   fluticasone (FLONASE) 50 MCG/ACT nasal spray Place 1 spray into both nostrils as needed for allergies or rhinitis.   gabapentin (NEURONTIN) 100 MG capsule Take 200 mg by mouth 2 (two) times daily.   glipiZIDE (GLUCOTROL XL) 10 MG 24 hr tablet Take 1 tablet (10 mg total) by mouth daily with breakfast.   Hydrocortisone Acetate (ANUSOL-HC RE) Place 1 application  rectally as needed.   Ketotifen Fumarate (EYE ITCH RELIEF OP) Place 1 drop into both eyes every 12 (twelve) hours as needed.   lisinopril (ZESTRIL) 2.5 MG tablet Take 2.5 mg by mouth in the morning.   loperamide (IMODIUM A-D) 2 MG tablet Take 2 mg by mouth every 12 (twelve) hours as needed for diarrhea or loose stools.   Melatonin 5 MG CAPS Take 1 capsule by mouth daily. As needed for insomnia   metFORMIN (GLUCOPHAGE) 500 MG tablet Take 250 mg by mouth every evening.   metFORMIN (GLUCOPHAGE) 500 MG tablet Take 500 mg by mouth every morning.   metoprolol succinate (TOPROL-XL) 100 MG 24 hr tablet Take 100 mg by mouth 2 (two) times daily. Take with or immediately following a meal.   montelukast (SINGULAIR) 10 MG tablet Take 10 mg by mouth every morning.   pravastatin (PRAVACHOL) 20 MG tablet Take 20 mg by mouth every evening.   senna (SENOKOT) 8.6 MG TABS tablet Take 2 tablets (17.2 mg total) by mouth at bedtime.   sertraline (ZOLOFT) 50 MG tablet Take 50 mg by mouth daily.   sitaGLIPtin (JANUVIA) 50 MG tablet Take 1 tablet (50 mg total) by mouth daily.   zinc oxide 20 % ointment Apply 1 application  topically as needed (To buttocks after every incontinent epsiode and as needed for  redness).   No facility-administered encounter medications on file as of 04/18/2023.    Review of Systems  Unable to perform ROS: Dementia    Immunization History  Administered Date(s) Administered   Fluad Quad(high Dose 65+) 05/03/2022   Influenza Split 03/19/2012, 03/19/2013   Influenza, High Dose Seasonal PF 04/09/2014, 03/22/2015, 03/24/2016, 03/29/2018, 04/16/2020   Influenza,inj,Quad PF,6+ Mos 04/09/2014, 03/22/2015, 03/24/2016   Influenza-Unspecified 03/19/2012, 03/19/2013, 04/09/2017, 03/24/2018, 04/16/2020   Moderna SARS-COV2 Booster Vaccination 05/16/2022   Moderna Sars-Covid-2 Vaccination 05/19/2021   PFIZER(Purple Top)SARS-COV-2 Vaccination 08/17/2019, 09/10/2019, 04/06/2020, 11/19/2020   Pfizer Covid-19 Vaccine Bivalent Booster 42yrs & up 11/18/2020   Pneumococcal Conjugate-13 11/05/2014   Pneumococcal Polysaccharide-23 07/10/2004,  12/12/2010, 05/02/2016   Pneumococcal-Unspecified 07/10/2004, 12/12/2010, 05/02/2016   Respiratory Syncytial Virus Vaccine,Recomb Aduvanted(Arexvy) 06/24/2022   Td 07/10/2009   Tdap 07/06/2022   Zoster Recombinant(Shingrix) 04/09/2017, 07/19/2017   Zoster, Live 07/10/2006   Pertinent  Health Maintenance Due  Topic Date Due   INFLUENZA VACCINE  02/08/2023   OPHTHALMOLOGY EXAM  03/18/2023   HEMOGLOBIN A1C  05/26/2023   FOOT EXAM  03/13/2024   DEXA SCAN  Completed      01/20/2022   12:54 PM 07/31/2022    9:44 AM 01/10/2023    3:48 PM 02/02/2023    2:47 PM 03/14/2023    3:13 PM  Fall Risk  Falls in the past year? 1 0 0 0 0  Was there an injury with Fall? 0 0 0 0 0  Fall Risk Category Calculator 1 0 0 0 0  Fall Risk Category (Retired) Low      (RETIRED) Patient Fall Risk Level High fall risk      Patient at Risk for Falls Due to History of fall(s);Impaired balance/gait;Impaired mobility History of fall(s);Impaired mobility;Impaired balance/gait No Fall Risks History of fall(s);Impaired balance/gait;Impaired mobility History of  fall(s);Impaired balance/gait  Fall risk Follow up Falls evaluation completed;Education provided;Falls prevention discussed Falls evaluation completed Falls evaluation completed Falls evaluation completed;Education provided;Falls prevention discussed Falls evaluation completed;Education provided   Functional Status Survey:    Vitals:   04/18/23 1321  BP: 103/67  Pulse: 71  Resp: 19  Temp: (!) 97.4 F (36.3 C)  SpO2: 92%  Weight: 164 lb 6.4 oz (74.6 kg)  Height: 5' (1.524 m)   Body mass index is 32.11 kg/m. Physical Exam Vitals reviewed.  Constitutional:      General: She is not in acute distress. HENT:     Head: Normocephalic.  Eyes:     General:        Right eye: No discharge.        Left eye: No discharge.  Cardiovascular:     Rate and Rhythm: Normal rate and regular rhythm.     Pulses: Normal pulses.     Heart sounds: Normal heart sounds.  Pulmonary:     Effort: Pulmonary effort is normal. No respiratory distress.     Breath sounds: Normal breath sounds. No wheezing.  Abdominal:     General: Bowel sounds are normal. There is no distension.     Palpations: Abdomen is soft.     Tenderness: There is no abdominal tenderness.  Musculoskeletal:     Cervical back: Neck supple.     Right lower leg: No edema.     Left lower leg: No edema.  Skin:    General: Skin is warm.     Capillary Refill: Capillary refill takes less than 2 seconds.  Neurological:     General: No focal deficit present.     Mental Status: She is alert. Mental status is at baseline.     Motor: Weakness present.     Gait: Gait abnormal.     Comments: wheelchair  Psychiatric:        Mood and Affect: Mood normal.     Labs reviewed: Recent Labs    06/12/22 0000 11/23/22 0000  NA 140 139  K 4.4 4.5  CL 105 107  CO2 24* 22  BUN 25* 21  CREATININE 1.2* 1.3*  CALCIUM 9.4 8.8   Recent Labs    06/12/22 0000  AST 14  ALT 12  ALKPHOS 122  ALBUMIN 4.2   Recent Labs  06/12/22 0000  11/23/22 0000  WBC 9.8 9.2  NEUTROABS  --  6,017.00  HGB 14.4 12.0  HCT 43 36  PLT 381 323   Lab Results  Component Value Date   TSH 3.100 08/12/2019   Lab Results  Component Value Date   HGBA1C 7.2 11/23/2022   Lab Results  Component Value Date   CHOL 191 06/12/2022   HDL 45 06/12/2022   LDLCALC 114 06/12/2022   TRIG 196 (A) 06/12/2022   CHOLHDL 4.4 12/19/2018    Significant Diagnostic Results in last 30 days:  No results found.  Assessment/Plan 1. Recurrent depression (HCC) - ongoing  - PHQ score 7 - periods of yelling at staff when she cannot do something - refused antidepressant, forgot she was on SSRI - will increase Zoloft to 75 mg  - sertraline (ZOLOFT) 50 MG tablet; Take 1.5 tablets (75 mg total) by mouth daily.  2. Unstable gait - ongoing - working with OT - able to transfer from wheelchair to commode on own  3. Urge incontinence - fine during day, more reports at night per chart review - cont skilled nursing   4. Mild dementia with other behavioral disturbance, unspecified dementia type (HCC) - improved since admission - MMSE 25/30, recent BIMS 15/15 - forgetful, but otherwise good historian - some exit seeking behaviors> suspect related to depression  - ? qualifies for AL>improve mood/ better socialization - will discuss AL possibility with care team more      Family/ staff Communication: plan discussed with patient and nurse  Labs/tests ordered:  none

## 2023-04-25 ENCOUNTER — Encounter: Payer: Self-pay | Admitting: Orthopedic Surgery

## 2023-04-25 ENCOUNTER — Non-Acute Institutional Stay (SKILLED_NURSING_FACILITY): Payer: Self-pay | Admitting: Orthopedic Surgery

## 2023-04-25 DIAGNOSIS — F03A18 Unspecified dementia, mild, with other behavioral disturbance: Secondary | ICD-10-CM | POA: Diagnosis not present

## 2023-04-25 DIAGNOSIS — F339 Major depressive disorder, recurrent, unspecified: Secondary | ICD-10-CM | POA: Diagnosis not present

## 2023-04-25 MED ORDER — SERTRALINE HCL 50 MG PO TABS
50.0000 mg | ORAL_TABLET | Freq: Every day | ORAL | Status: DC
Start: 2023-04-25 — End: 2024-04-06

## 2023-04-25 NOTE — Progress Notes (Signed)
Location:  [facility] Room Number: 26/A Place of Service:  SNF [phone]) Provider:  Octavia Heir, NP   [name], MD  Patient Care Team: [name], MD as PCP - General (Internal Medicine) Ernesto Rutherford, MD (Ophthalmology) Claud Kelp, MD as Consulting Physician (General Surgery) Carlus Pavlov, MD as Consulting Physician (Internal Medicine)  Extended Emergency Contact Information Primary Emergency Contact: [name],[name] Address: [address]          [place] [address] [place] Home Phone: [phone] Mobile Phone: [phone] Relation: Spouse  Code Status:  DNR Goals of care: Advanced Directive information    03/14/2023   10:04 AM  Advanced Directives  Does Patient Have a Medical Advance Directive? Yes  Type of Estate agent of [place];Living will;Out of facility DNR (pink MOST or yellow form)  Does patient want to make changes to medical advance directive? No - Patient declined  Copy of Healthcare Power of Attorney in Chart? Yes - validated most recent copy scanned in chart (See row information)     Chief Complaint  Patient presents with   Acute Visit    depression    HPI:  Pt is a 82 y.o. female seen today for acute visit due to depression.   She currently resides on the skilled nursing unit at [facility]. PMH: HTN, HLD, GERD, IBS, T2DM, polyneuropathy, DJD, osteopenia, Factor 5 mutation, breast cancer s/p  left lumpectomy 1996, incontinence, and anxiety.   "10/09 she attempted to leave unit. She was trying to talk with laundry staff. Wander guard on her wheelchair set off alarm. Nursing tried to assist her to her room. She began to yell at staff stating " I am in prison" and " Why can't I leave here." She also would not move her wheelchair. I was able to calm her down and assist her back to her room. She admits to increased depression. She forgets she is taking Zoloft daily. She refuses  antidepressant today. She has had more incidents of agitation with staff within last 6 months. 06/2021 she was followed by hospice and admitted to SNF. Since her stay, she has improved and discharged from Hospice services. Husband visits almost daily. She reports being angry with him for making her live in a nursing home. "  10/09 her zoloft was increased to 75 mg. A few days later she refused increase of medication. She reports, " not feeling herself." Care plan meeting with patient, husband and healthcare team at [facility]. We discussed depression and increased episodes of agitation. Poor memory was also discussed. At this time, she does not want increased zoloft. She is open to counseling services.   She was followed by Dr. Marjory Lies in 2021 due to gradual changes in sleep, gait, behavior and personality. MMSE 25/30. She was started on Namenda around this time. Not currently on Namenda. 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 She is followed by palliative care. She has not follow up with neurology since 2021 encounter.   Past Medical History:  Diagnosis Date   Allergy    [name]; allergy shots weekly.     Anxiety    Arthritis    DDD lumbar spine.     Cancer (HCC)    breast L x 2; skin basal cell carcinoma [name]).   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   Past Surgical History:  Procedure Laterality Date   BREAST LUMPECTOMY  1996   L breast cancer   BREAST SURGERY N/A    Phreesia 12/12/2019   EYE SURGERY N/A    Phreesia 12/12/2019   MASTECTOMY  2002   Bilateral for breast cancer   VAGINAL HYSTERECTOMY  1985   DUB; uterine fibroids; ovaries intact.    Allergies  Allergen Reactions   Fruit & Vegetable Daily [Nutritional Supplements] Diarrhea   Mixed Ragweed    Onion  Other (See Comments)    indigestion   Tree Extract Swelling   Sulfa Drugs Cross Reactors Rash    All over the body    Outpatient Encounter Medications as of 04/25/2023  Medication Sig   acetaminophen (TYLENOL) 500 MG tablet Take 1,000 mg by mouth every 12 (twelve) hours as needed.   acetaminophen (TYLENOL) 500 MG tablet Take 1,000 mg by mouth every evening.   albuterol (VENTOLIN HFA) 108 (90 Base) MCG/ACT inhaler Inhale 2 puffs into the lungs every 6 (six) hours as needed for wheezing or shortness of breath.   antiseptic oral rinse (BIOTENE) LIQD 30 mLs by Mouth Rinse route 2 (two) times daily. For dry mouth   aspirin 81 MG chewable tablet Chew by mouth daily.   calcium carbonate (TUMS) 500 MG chewable tablet Chew 1-2 tablets (200-400 mg of elemental calcium total) by mouth 3 (three) times daily as needed for indigestion or heartburn.   clotrimazole (GYNE-LOTRIMIN) 1 % vaginal cream Place 1 Applicatorful vaginally as needed.   fluconazole (DIFLUCAN) 150 MG tablet Take 150 mg by mouth as directed. Weekly on Tuesdays at bedtime.   fluticasone (FLONASE) 50 MCG/ACT nasal spray Place 1 spray into both nostrils as needed for allergies or rhinitis.   gabapentin (NEURONTIN) 100 MG capsule Take 200 mg by mouth 2 (two) times daily.   glipiZIDE (GLUCOTROL XL) 10 MG 24 hr tablet Take 1 tablet (10 mg total) by mouth daily with breakfast.   Hydrocortisone Acetate (ANUSOL-HC RE) Place 1 application  rectally as needed.   Ketotifen Fumarate (EYE ITCH RELIEF OP) Place 1 drop into both eyes every 12 (twelve) hours as needed.   lisinopril (ZESTRIL) 2.5 MG tablet Take 2.5 mg by mouth in the morning.   loperamide (IMODIUM A-D) 2 MG tablet Take 2 mg by mouth every 12 (twelve) hours as needed for diarrhea or loose stools.   Melatonin 5 MG CAPS Take 1 capsule by mouth daily. As needed for insomnia   metFORMIN (GLUCOPHAGE) 500 MG tablet Take 250 mg by mouth every evening.   metFORMIN (GLUCOPHAGE) 500 MG tablet Take  500 mg by mouth every morning.   metoprolol succinate (TOPROL-XL) 100 MG 24 hr tablet Take 100 mg by mouth 2 (two) times daily. Take with or immediately following a meal.   montelukast (SINGULAIR) 10 MG tablet Take 10 mg by mouth every morning.   pravastatin (PRAVACHOL) 20 MG tablet Take 20 mg by mouth every evening.   senna (SENOKOT) 8.6 MG TABS tablet Take 2 tablets (17.2 mg total) by mouth at bedtime.   sertraline (ZOLOFT) 50 MG tablet Take 1.5 tablets (75 mg total) by mouth daily.   sitaGLIPtin (JANUVIA) 50 MG tablet Take 1 tablet (50 mg total) by mouth daily.   zinc oxide 20 % ointment Apply 1 application  topically as needed (To buttocks after every incontinent epsiode and as needed for  redness).   No facility-administered encounter medications on file as of 04/25/2023.    Review of Systems  Unable to perform ROS: Dementia    Immunization History  Administered Date(s) Administered   Fluad Quad(high Dose 65+) 05/03/2022   Influenza Split 03/19/2012, 03/19/2013   Influenza, High Dose Seasonal PF 04/09/2014, 03/22/2015, 03/24/2016, 03/29/2018, 04/16/2020   Influenza,inj,Quad PF,6+ Mos 04/09/2014, 03/22/2015, 03/24/2016   Influenza-Unspecified 03/19/2012, 03/19/2013, 04/09/2017, 03/24/2018, 04/16/2020   Moderna SARS-COV2 Booster Vaccination 05/16/2022   Moderna Sars-Covid-2 Vaccination 05/19/2021   PFIZER(Purple Top)SARS-COV-2 Vaccination 08/17/2019, 09/10/2019, 04/06/2020, 11/19/2020   Pfizer Covid-19 Vaccine Bivalent Booster 53yrs & up 11/18/2020   Pneumococcal Conjugate-13 11/05/2014   Pneumococcal Polysaccharide-23 07/10/2004, 12/12/2010, 05/02/2016   Pneumococcal-Unspecified 07/10/2004, 12/12/2010, 05/02/2016   Respiratory Syncytial Virus Vaccine,Recomb Aduvanted(Arexvy) 06/24/2022   Td 07/10/2009   Tdap 07/06/2022   Zoster Recombinant(Shingrix) 04/09/2017, 07/19/2017   Zoster, Live 07/10/2006   Pertinent  Health Maintenance Due  Topic Date Due   INFLUENZA VACCINE   02/08/2023   HEMOGLOBIN A1C  05/26/2023   FOOT EXAM  03/13/2024   OPHTHALMOLOGY EXAM  04/15/2024   DEXA SCAN  Completed      07/31/2022    9:44 AM 01/10/2023    3:48 PM 02/02/2023    2:47 PM 03/14/2023    3:13 PM 04/18/2023    2:05 PM  Fall Risk  Falls in the past year? 0 0 0 0 0  Was there an injury with Fall? 0 0 0 0 0  Fall Risk Category Calculator 0 0 0 0 0  Patient at Risk for Falls Due to History of fall(s);Impaired mobility;Impaired balance/gait No Fall Risks History of fall(s);Impaired balance/gait;Impaired mobility History of fall(s);Impaired balance/gait History of fall(s);Impaired balance/gait;Impaired mobility  Fall risk Follow up Falls evaluation completed Falls evaluation completed Falls evaluation completed;Education provided;Falls prevention discussed Falls evaluation completed;Education provided    Functional Status Survey:    Vitals:   04/25/23 1500  BP: 118/75  Pulse: 70  Resp: 17  Temp: (!) 97.3 F (36.3 C)  SpO2: 94%  Weight: 168 lb 3.2 oz (76.3 kg)  Height: 5' (1.524 m)   Body mass index is 32.85 kg/m. Physical Exam Vitals reviewed.  Constitutional:      General: She is not in acute distress. HENT:     Head: Normocephalic.  Eyes:     General:        Right eye: No discharge.        Left eye: No discharge.  Cardiovascular:     Rate and Rhythm: Normal rate and regular rhythm.     Pulses: Normal pulses.     Heart sounds: Normal heart sounds.  Pulmonary:     Effort: Pulmonary effort is normal. No respiratory distress.     Breath sounds: Normal breath sounds. No wheezing or rales.  Abdominal:     General: Bowel sounds are normal.     Palpations: Abdomen is soft.  Musculoskeletal:     Cervical back: Neck supple.     Right lower leg: No edema.     Left lower leg: No edema.  Skin:    General: Skin is warm.     Capillary Refill: Capillary refill takes less than 2 seconds.  Neurological:     General: No focal deficit present.     Mental Status: She  is alert. Mental status is at baseline.     Motor: Weakness present.     Gait: Gait abnormal.  Psychiatric:        Mood  and Affect: Mood normal.     Labs reviewed: Recent Labs    06/12/22 0000 11/23/22 0000  NA 140 139  K 4.4 4.5  CL 105 107  CO2 24* 22  BUN 25* 21  CREATININE 1.2* 1.3*  CALCIUM 9.4 8.8   Recent Labs    06/12/22 0000  AST 14  ALT 12  ALKPHOS 122  ALBUMIN 4.2   Recent Labs    06/12/22 0000 11/23/22 0000  WBC 9.8 9.2  NEUTROABS  --  6,017.00  HGB 14.4 12.0  HCT 43 36  PLT 381 323   Lab Results  Component Value Date   TSH 3.100 08/12/2019   Lab Results  Component Value Date   HGBA1C 7.2 11/23/2022   Lab Results  Component Value Date   CHOL 191 06/12/2022   HDL 45 06/12/2022   LDLCALC 114 06/12/2022   TRIG 196 (A) 06/12/2022   CHOLHDL 4.4 12/19/2018    Significant Diagnostic Results in last 30 days:  No results found.  Assessment/Plan 1. Recurrent depression (HCC) - ongoing - PHQ score 7 04/18/2023 - periods of yelling at staff when she cannot do something - refused antidepressant, forgot she was on SSRI - refused recent Zoloft increase> " not feeling herself" - cont current dose Zoloft - social work offered options for counseling - consider changing antidepressant to Lexapro/ Wellbutrin if unable to increase  2. Mild dementia with other behavioral disturbance, unspecified dementia type (HCC) - evaluated by Dr. Marjory Lies in 2021> neurodegenerative dementia> started on Namenda- not on at this time - last MMSE 25/30 - could benefit with another neurology evaluation> would need new referral since 3 years have passed - plan to discuss with husband - consider restarting Namenda to see if mood improves   Family/ staff Communication: plan discussed with patient and nurse  Labs/tests ordered:  none

## 2023-04-30 LAB — COMPREHENSIVE METABOLIC PANEL: Calcium: 8.5 — AB (ref 8.7–10.7)

## 2023-04-30 LAB — CBC AND DIFFERENTIAL
HCT: 34 — AB (ref 36–46)
Hemoglobin: 11.2 — AB (ref 12.0–16.0)
Neutrophils Absolute: 11160
Platelets: 328 10*3/uL (ref 150–400)
WBC: 14.4

## 2023-04-30 LAB — BASIC METABOLIC PANEL
BUN: 32 — AB (ref 4–21)
CO2: 17 (ref 13–22)
Chloride: 108 (ref 99–108)
Creatinine: 1.6 — AB (ref 0.5–1.1)
Glucose: 213
Potassium: 4.1 meq/L (ref 3.5–5.1)
Sodium: 137 (ref 137–147)

## 2023-04-30 LAB — CBC: RBC: 3.95 (ref 3.87–5.11)

## 2023-05-01 ENCOUNTER — Non-Acute Institutional Stay (SKILLED_NURSING_FACILITY): Payer: Medicare PPO | Admitting: Adult Health

## 2023-05-01 ENCOUNTER — Encounter: Payer: Self-pay | Admitting: Adult Health

## 2023-05-01 DIAGNOSIS — D72821 Monocytosis (symptomatic): Secondary | ICD-10-CM | POA: Diagnosis not present

## 2023-05-01 DIAGNOSIS — R300 Dysuria: Secondary | ICD-10-CM | POA: Diagnosis not present

## 2023-05-01 NOTE — Progress Notes (Signed)
Location:  [facility] Room Number: N26A Place of Service:  SNF [phone]) Provider:  Kenard Gower, DNP, FNP-BC  Patient Care Team: [name], MD as PCP - General (Internal Medicine) Ernesto Rutherford, MD (Ophthalmology) Claud Kelp, MD as Consulting Physician (General Surgery) Carlus Pavlov, MD as Consulting Physician (Internal Medicine)  Extended Emergency Contact Information Primary Emergency Contact: [name],[name] Address: [address]           [address] [place] Home Phone: [phone] Mobile Phone: [phone] Relation: Spouse  Code Status:  DNR  Goals of care: Advanced Directive information    05/01/2023    3:54 PM  Advanced Directives  Does Patient Have a Medical Advance Directive? Yes  Type of Estate agent of [place];Out of facility DNR (pink MOST or yellow form);Living will  Does patient want to make changes to medical advance directive? No - Patient declined  Copy of Healthcare Power of Attorney in Chart? Yes - validated most recent copy scanned in chart (See row information)     Chief Complaint  Patient presents with   Acute Visit    Cough and Fever    HPI:  Pt is a 82 y.o. female seen today for cough and fever. She is a resident of [id number]. She was reported to have productive cough   X 1 week. Temp 100 today. She stated that she feels weak and has burning sensation when she urinates. She complains of  "scratchy throat". She tested negative for COVID-19 and flu test. Stat labs showed WBC 14.4, absolute monocytes 1728 and absolute neutrophils 11,160 (both elevated).  Chest x-ray was negative for infiltrate.   Past Medical History:  Diagnosis Date   Allergy    [name]; allergy shots weekly.     Anxiety    Arthritis    DDD lumbar spine.     Cancer (HCC)    breast L x 2; skin basal cell carcinoma [name]).   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   Past Surgical History:  Procedure Laterality Date   BREAST LUMPECTOMY  1996   L breast cancer   BREAST SURGERY N/A    Phreesia 12/12/2019   EYE SURGERY N/A    Phreesia 12/12/2019   MASTECTOMY  2002   Bilateral for breast cancer   VAGINAL HYSTERECTOMY  1985   DUB; uterine fibroids; ovaries intact.    Allergies  Allergen Reactions   Fruit & Vegetable Daily [Nutritional Supplements] Diarrhea   Mixed Ragweed    Onion Other (See Comments)    indigestion   Tree Extract Swelling   Sulfa Drugs Cross Reactors Rash    All over the body    Outpatient Encounter Medications as of 05/01/2023  Medication Sig   acetaminophen (TYLENOL) 500 MG tablet Take 1,000 mg by mouth every 12 (twelve) hours as needed.   acetaminophen (TYLENOL) 500 MG tablet Take 1,000 mg by mouth every evening.   albuterol (VENTOLIN HFA) 108 (90 Base) MCG/ACT inhaler Inhale 2 puffs into the lungs every 6 (six) hours as needed for wheezing or shortness of breath.   antiseptic oral rinse (BIOTENE) LIQD 30 mLs by Mouth Rinse route 2 (two) times daily. For dry mouth   aspirin 81  MG chewable tablet Chew by mouth daily.   calcium carbonate (TUMS) 500 MG chewable tablet Chew 1-2 tablets (200-400 mg of elemental calcium total) by mouth 3 (three) times daily as needed for indigestion or heartburn.   clotrimazole (GYNE-LOTRIMIN) 1 % vaginal cream Place 1 Applicatorful vaginally as needed.   doxycycline (DORYX) 100 MG EC tablet Take 100 mg by mouth 2 (two) times daily.   fluconazole (DIFLUCAN) 150 MG tablet Take 150 mg by mouth as directed. Weekly on Tuesdays at bedtime.   fluticasone (FLONASE) 50 MCG/ACT nasal spray Place 1 spray into both nostrils as needed for allergies or rhinitis.   gabapentin (NEURONTIN) 100 MG capsule Take 200 mg  by mouth 2 (two) times daily.   glipiZIDE (GLUCOTROL XL) 10 MG 24 hr tablet Take 1 tablet (10 mg total) by mouth daily with breakfast.   guaiFENesin-dextromethorphan (ROBITUSSIN DM) 100-10 MG/5ML syrup Take 10 mLs by mouth every 6 (six) hours as needed for cough.   Hydrocortisone Acetate (ANUSOL-HC RE) Place 1 application  rectally as needed.   Ketotifen Fumarate (EYE ITCH RELIEF OP) Place 1 drop into both eyes every 12 (twelve) hours as needed.   lisinopril (ZESTRIL) 2.5 MG tablet Take 2.5 mg by mouth in the morning.   loperamide (IMODIUM A-D) 2 MG tablet Take 2 mg by mouth every 12 (twelve) hours as needed for diarrhea or loose stools.   Melatonin 5 MG CAPS Take 1 capsule by mouth daily. As needed for insomnia   metFORMIN (GLUCOPHAGE) 500 MG tablet Take 250 mg by mouth every evening.   metFORMIN (GLUCOPHAGE) 500 MG tablet Take 500 mg by mouth every morning.   metoprolol succinate (TOPROL-XL) 100 MG 24 hr tablet Take 100 mg by mouth 2 (two) times daily. Take with or immediately following a meal.   montelukast (SINGULAIR) 10 MG tablet Take 10 mg by mouth every morning.   pravastatin (PRAVACHOL) 20 MG tablet Take 20 mg by mouth every evening.   sertraline (ZOLOFT) 50 MG tablet Take 1 tablet (50 mg total) by mouth daily.   sitaGLIPtin (JANUVIA) 50 MG tablet Take 1 tablet (50 mg total) by mouth daily.   zinc oxide 20 % ointment Apply 1 application  topically as needed (To buttocks after every incontinent epsiode and as needed for redness).   [DISCONTINUED] senna (SENOKOT) 8.6 MG TABS tablet Take 2 tablets (17.2 mg total) by mouth at bedtime.   No facility-administered encounter medications on file as of 05/01/2023.    Review of Systems  Constitutional:  Negative for appetite change, chills, fatigue and fever.  HENT:  Negative for congestion, hearing loss, rhinorrhea and sore throat.   Eyes: Negative.   Respiratory:  Negative for cough, shortness of breath and wheezing.   Cardiovascular:   Negative for chest pain, palpitations and leg swelling.  Gastrointestinal:  Negative for abdominal pain, constipation, diarrhea, nausea and vomiting.  Genitourinary:  Positive for dysuria.  Musculoskeletal:  Negative for arthralgias, back pain and myalgias.  Skin:  Negative for color change, rash and wound.  Neurological:  Positive for weakness. Negative for dizziness and headaches.  Psychiatric/Behavioral:  Negative for behavioral problems. The patient is not nervous/anxious.       Immunization History  Administered Date(s) Administered   Fluad Quad(high Dose 65+) 05/03/2022   Influenza Split 03/19/2012, 03/19/2013   Influenza, High Dose Seasonal PF 04/09/2014, 03/22/2015, 03/24/2016, 03/29/2018, 04/16/2020   Influenza,inj,Quad PF,6+ Mos 04/09/2014, 03/22/2015, 03/24/2016   Influenza-Unspecified 03/19/2012, 03/19/2013, 04/09/2017, 03/24/2018, 04/16/2020   Moderna SARS-COV2 Booster  Vaccination 05/16/2022   Moderna Sars-Covid-2 Vaccination 05/19/2021   PFIZER(Purple Top)SARS-COV-2 Vaccination 08/17/2019, 09/10/2019, 04/06/2020, 11/19/2020   Pfizer Covid-19 Vaccine Bivalent Booster 81yrs & up 11/18/2020, 12/21/2021   Pneumococcal Conjugate-13 11/05/2014   Pneumococcal Polysaccharide-23 07/10/2004, 12/12/2010, 05/02/2016   Pneumococcal-Unspecified 07/10/2004, 12/12/2010, 05/02/2016   Respiratory Syncytial Virus Vaccine,Recomb Aduvanted(Arexvy) 06/24/2022   Td 07/10/2009   Tdap 07/06/2022   Zoster Recombinant(Shingrix) 04/09/2017, 07/19/2017   Zoster, Live 07/10/2006   Pertinent  Health Maintenance Due  Topic Date Due   INFLUENZA VACCINE  02/08/2023   HEMOGLOBIN A1C  09/12/2023   FOOT EXAM  03/13/2024   OPHTHALMOLOGY EXAM  04/15/2024   DEXA SCAN  Completed      07/31/2022    9:44 AM 01/10/2023    3:48 PM 02/02/2023    2:47 PM 03/14/2023    3:13 PM 04/18/2023    2:05 PM  Fall Risk  Falls in the past year? 0 0 0 0 0  Was there an injury with Fall? 0 0 0 0 0  Fall Risk Category  Calculator 0 0 0 0 0  Patient at Risk for Falls Due to History of fall(s);Impaired mobility;Impaired balance/gait No Fall Risks History of fall(s);Impaired balance/gait;Impaired mobility History of fall(s);Impaired balance/gait History of fall(s);Impaired balance/gait;Impaired mobility  Fall risk Follow up Falls evaluation completed Falls evaluation completed Falls evaluation completed;Education provided;Falls prevention discussed Falls evaluation completed;Education provided      Vitals:   05/01/23 1545  BP: (!) 106/54  Pulse: 82  Resp: 16  Temp: (!) 97.5 F (36.4 C)  SpO2: 95%  Weight: 168 lb 3.2 oz (76.3 kg)  Height: 5' (1.524 m)   Body mass index is 32.85 kg/m.  Physical Exam Constitutional:      Appearance: Normal appearance.  HENT:     Head: Normocephalic and atraumatic.     Nose: Nose normal.     Mouth/Throat:     Mouth: Mucous membranes are moist.  Eyes:     Conjunctiva/sclera: Conjunctivae normal.  Cardiovascular:     Rate and Rhythm: Normal rate and regular rhythm.  Pulmonary:     Effort: Pulmonary effort is normal.     Breath sounds: Normal breath sounds.  Abdominal:     General: Bowel sounds are normal.     Palpations: Abdomen is soft.  Musculoskeletal:        General: Normal range of motion.     Cervical back: Normal range of motion.  Skin:    General: Skin is warm and dry.  Neurological:     General: No focal deficit present.     Mental Status: She is alert and oriented to person, place, and time.  Psychiatric:        Mood and Affect: Mood normal.        Behavior: Behavior normal.        Thought Content: Thought content normal.        Judgment: Judgment normal.        Labs reviewed: Recent Labs    06/12/22 0000 11/23/22 0000  NA 140 139  K 4.4 4.5  CL 105 107  CO2 24* 22  BUN 25* 21  CREATININE 1.2* 1.3*  CALCIUM 9.4 8.8   Recent Labs    06/12/22 0000  AST 14  ALT 12  ALKPHOS 122  ALBUMIN 4.2   Recent Labs    06/12/22 0000  11/23/22 0000  WBC 9.8 9.2  NEUTROABS  --  6,017.00  HGB 14.4 12.0  HCT 43 36  PLT 381 323   Lab Results  Component Value Date   TSH 3.100 08/12/2019   Lab Results  Component Value Date   HGBA1C 8.7 03/15/2023   Lab Results  Component Value Date   CHOL 191 06/12/2022   HDL 45 06/12/2022   LDLCALC 114 06/12/2022   TRIG 196 (A) 06/12/2022   CHOLHDL 4.4 12/19/2018    Significant Diagnostic Results in last 30 days:  No results found.  Assessment/Plan  1. Monocytosis -  wbc 14.4, absolute monocytes 1728, absolute neutrophils 11,160 -  has scratchy throat -  will start on Doxycycline 100 mg BID X 10 days  2.  Dysuria -  will get urinalysis with culture and sensitivity    Family/ staff Communication: Discussed plan of care with resident and charge nurse.  Labs/tests ordered: Chest x-ray PA and lateral, CBC and BMP, UA with CS    Kenard Gower, DNP, MSN, FNP-BC [facility] [phone] (Monday-Friday 8:00 a.m. - 5:00 p.m.) [phone] (after hours)

## 2023-05-03 LAB — CBC AND DIFFERENTIAL
HCT: 35 — AB (ref 36–46)
Hemoglobin: 11.4 — AB (ref 12.0–16.0)
Neutrophils Absolute: 8803
Platelets: 402 10*3/uL — AB (ref 150–400)
WBC: 11.8

## 2023-05-03 LAB — BASIC METABOLIC PANEL
BUN: 40 — AB (ref 4–21)
CO2: 16 (ref 13–22)
Chloride: 110 — AB (ref 99–108)
Creatinine: 1.4 — AB (ref 0.5–1.1)
Glucose: 108
Potassium: 4.5 meq/L (ref 3.5–5.1)
Sodium: 138 (ref 137–147)

## 2023-05-03 LAB — COMPREHENSIVE METABOLIC PANEL: Calcium: 8.7 (ref 8.7–10.7)

## 2023-05-03 LAB — CBC: RBC: 4.06 (ref 3.87–5.11)

## 2023-05-03 NOTE — Progress Notes (Incomplete)
Location:  [facility] Room Number: N26A Place of Service:  SNF ([phone]) Provider:  Kenard Gower, DNP, FNP-BC  Patient Care Team: [name], MD as PCP - General (Internal Medicine) Ernesto Rutherford, MD (Ophthalmology) Claud Kelp, MD as Consulting Physician (General Surgery) Carlus Pavlov, MD as Consulting Physician (Internal Medicine)  Extended Emergency Contact Information Primary Emergency Contact: [name],[name] Address: [address]          [place] [address] [place] Home Phone: [phone] Mobile Phone: [phone] Relation: Spouse  Code Status:  DNR  Goals of care: Advanced Directive information    05/01/2023    3:54 PM  Advanced Directives  Does Patient Have a Medical Advance Directive? Yes  Type of Estate agent of [place];Out of facility DNR (pink MOST or yellow form);Living will  Does patient want to make changes to medical advance directive? No - Patient declined  Copy of Healthcare Power of Attorney in Chart? Yes - validated most recent copy scanned in chart (See row information)     Chief Complaint  Patient presents with  . Acute Visit    Cough and Fever    HPI:  Pt is a 82 y.o. female seen today for cough and fever. She is a resident of [id number]. She was reported to have productive cough   X 1 week. Temp 100 today. She stated that she feels weak and has burning sensation when she urinates. She tested negative for COVID-19 and flu test. Stat labs showed WBC 14.4     Past Medical History:  Diagnosis Date  . Allergy    [name]; allergy shots weekly.    . Anxiety   . Arthritis    DDD lumbar spine.    . Cancer (HCC)    breast L x 2; skin basal cell carcinoma [name]).  . 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 Diabetes mellitus   . Diabetes mellitus without complication (HCC)    Phreesia 12/12/2019  . Hypertension   .  IBS (irritable bowel syndrome)    diarrhea predominant.   Past Surgical History:  Procedure Laterality Date  . BREAST LUMPECTOMY  1996   L breast cancer  . BREAST SURGERY N/A    Phreesia 12/12/2019  . EYE SURGERY N/A    Phreesia 12/12/2019  . MASTECTOMY  2002   Bilateral for breast cancer  . VAGINAL HYSTERECTOMY  1985   DUB; uterine fibroids; ovaries intact.    Allergies  Allergen Reactions  . Fruit & Vegetable Daily [Nutritional Supplements] Diarrhea  . Mixed Ragweed   . Onion Other (See Comments)    indigestion  . Tree Extract Swelling  . Sulfa Drugs Cross Reactors Rash    All over the body    Outpatient Encounter Medications as of 05/01/2023  Medication Sig  . acetaminophen (TYLENOL) 500 MG tablet Take 1,000 mg by mouth every 12 (twelve) hours as needed.  Marland Kitchen acetaminophen (TYLENOL) 500 MG tablet Take 1,000 mg by mouth every evening.  Marland Kitchen albuterol (VENTOLIN HFA) 108 (90 Base) MCG/ACT inhaler Inhale 2 puffs into the lungs every 6 (six) hours as needed for wheezing or shortness of breath.  Marland Kitchen antiseptic oral rinse (BIOTENE) LIQD 30 mLs by Mouth Rinse route 2 (two) times daily. For dry mouth  . aspirin 81 MG chewable tablet Chew by mouth daily.  . calcium carbonate (TUMS) 500 MG chewable tablet Chew 1-2 tablets (200-400  mg of elemental calcium total) by mouth 3 (three) times daily as needed for indigestion or heartburn.  . clotrimazole (GYNE-LOTRIMIN) 1 % vaginal cream Place 1 Applicatorful vaginally as needed.  . doxycycline (DORYX) 100 MG EC tablet Take 100 mg by mouth 2 (two) times daily.  . fluconazole (DIFLUCAN) 150 MG tablet Take 150 mg by mouth as directed. Weekly on Tuesdays at bedtime.  . fluticasone (FLONASE) 50 MCG/ACT nasal spray Place 1 spray into both nostrils as needed for allergies or rhinitis.  Marland Kitchen gabapentin (NEURONTIN) 100 MG capsule Take 200 mg by mouth 2 (two) times daily.  Marland Kitchen glipiZIDE (GLUCOTROL XL) 10 MG 24 hr tablet Take 1 tablet (10 mg total) by mouth daily  with breakfast.  . guaiFENesin-dextromethorphan (ROBITUSSIN DM) 100-10 MG/5ML syrup Take 10 mLs by mouth every 6 (six) hours as needed for cough.  . Hydrocortisone Acetate (ANUSOL-HC RE) Place 1 application  rectally as needed.  Marland Kitchen Ketotifen Fumarate (EYE ITCH RELIEF OP) Place 1 drop into both eyes every 12 (twelve) hours as needed.  Marland Kitchen lisinopril (ZESTRIL) 2.5 MG tablet Take 2.5 mg by mouth in the morning.  . loperamide (IMODIUM A-D) 2 MG tablet Take 2 mg by mouth every 12 (twelve) hours as needed for diarrhea or loose stools.  . Melatonin 5 MG CAPS Take 1 capsule by mouth daily. As needed for insomnia  . metFORMIN (GLUCOPHAGE) 500 MG tablet Take 250 mg by mouth every evening.  . metFORMIN (GLUCOPHAGE) 500 MG tablet Take 500 mg by mouth every morning.  . metoprolol succinate (TOPROL-XL) 100 MG 24 hr tablet Take 100 mg by mouth 2 (two) times daily. Take with or immediately following a meal.  . montelukast (SINGULAIR) 10 MG tablet Take 10 mg by mouth every morning.  . pravastatin (PRAVACHOL) 20 MG tablet Take 20 mg by mouth every evening.  . sertraline (ZOLOFT) 50 MG tablet Take 1 tablet (50 mg total) by mouth daily.  . sitaGLIPtin (JANUVIA) 50 MG tablet Take 1 tablet (50 mg total) by mouth daily.  Marland Kitchen zinc oxide 20 % ointment Apply 1 application  topically as needed (To buttocks after every incontinent epsiode and as needed for redness).  . [DISCONTINUED] senna (SENOKOT) 8.6 MG TABS tablet Take 2 tablets (17.2 mg total) by mouth at bedtime.   No facility-administered encounter medications on file as of 05/01/2023.    Review of Systems ***    Immunization History  Administered Date(s) Administered  . Fluad Quad(high Dose 65+) 05/03/2022  . Influenza Split 03/19/2012, 03/19/2013  . Influenza, High Dose Seasonal PF 04/09/2014, 03/22/2015, 03/24/2016, 03/29/2018, 04/16/2020  . Influenza,inj,Quad PF,6+ Mos 04/09/2014, 03/22/2015, 03/24/2016  . Influenza-Unspecified 03/19/2012, 03/19/2013,  04/09/2017, 03/24/2018, 04/16/2020  . Moderna SARS-COV2 Booster Vaccination 05/16/2022  . Moderna Sars-Covid-2 Vaccination 05/19/2021  . PFIZER(Purple Top)SARS-COV-2 Vaccination 08/17/2019, 09/10/2019, 04/06/2020, 11/19/2020  . Research officer, trade [employer] 72yrs & up 11/18/2020, 12/21/2021  . Pneumococcal Conjugate-13 11/05/2014  . Pneumococcal Polysaccharide-23 07/10/2004, 12/12/2010, 05/02/2016  . Pneumococcal-Unspecified 07/10/2004, 12/12/2010, 05/02/2016  . Respiratory Syncytial Virus Vaccine,Recomb Aduvanted(Arexvy) 06/24/2022  . Td 07/10/2009  . Tdap 07/06/2022  . Zoster Recombinant(Shingrix) 04/09/2017, 07/19/2017  . Zoster, Live 07/10/2006   Pertinent  Health Maintenance Due  Topic Date Due  . INFLUENZA VACCINE  02/08/2023  . HEMOGLOBIN A1C  09/12/2023  . FOOT EXAM  03/13/2024  . OPHTHALMOLOGY EXAM  04/15/2024  . DEXA SCAN  Completed      07/31/2022    9:44 AM 01/10/2023    3:48 PM 02/02/2023  2:47 PM 03/14/2023    3:13 PM 04/18/2023    2:05 PM  Fall Risk  Falls in the past year? 0 0 0 0 0  Was there an injury with Fall? 0 0 0 0 0  Fall Risk Category Calculator 0 0 0 0 0  Patient at Risk for Falls Due to History of fall(s);Impaired mobility;Impaired balance/gait No Fall Risks History of fall(s);Impaired balance/gait;Impaired mobility History of fall(s);Impaired balance/gait History of fall(s);Impaired balance/gait;Impaired mobility  Fall risk Follow up Falls evaluation completed Falls evaluation completed Falls evaluation completed;Education provided;Falls prevention discussed Falls evaluation completed;Education provided      Vitals:   05/01/23 1545  BP: (!) 106/54  Pulse: 82  Resp: 16  Temp: (!) 97.5 F (36.4 C)  SpO2: 95%  Weight: 168 lb 3.2 oz (76.3 kg)  Height: 5' (1.524 m)   Body mass index is 32.85 kg/m.  Physical Exam     Labs reviewed: Recent Labs    06/12/22 0000 11/23/22 0000  NA 140 139  K 4.4 4.5  CL 105 107  CO2 24* 22  BUN  25* 21  CREATININE 1.2* 1.3*  CALCIUM 9.4 8.8   Recent Labs    06/12/22 0000  AST 14  ALT 12  ALKPHOS 122  ALBUMIN 4.2   Recent Labs    06/12/22 0000 11/23/22 0000  WBC 9.8 9.2  NEUTROABS  --  6,017.00  HGB 14.4 12.0  HCT 43 36  PLT 381 323   Lab Results  Component Value Date   TSH 3.100 08/12/2019   Lab Results  Component Value Date   HGBA1C 8.7 03/15/2023   Lab Results  Component Value Date   CHOL 191 06/12/2022   HDL 45 06/12/2022   LDLCALC 114 06/12/2022   TRIG 196 (A) 06/12/2022   CHOLHDL 4.4 12/19/2018    Significant Diagnostic Results in last 30 days:  No results found.  Assessment/Plan ***   Family/ staff Communication: Discussed plan of care with resident and charge nurse  Labs/tests ordered:     Kenard Gower, DNP, MSN, FNP-BC [facility] [phone] (Monday-Friday 8:00 a.m. - 5:00 p.m.) [phone] (after hours)

## 2023-05-14 ENCOUNTER — Encounter: Payer: Self-pay | Admitting: Orthopedic Surgery

## 2023-05-14 ENCOUNTER — Non-Acute Institutional Stay (SKILLED_NURSING_FACILITY): Payer: Medicare PPO | Admitting: Orthopedic Surgery

## 2023-05-14 DIAGNOSIS — N1832 Chronic kidney disease, stage 3b: Secondary | ICD-10-CM

## 2023-05-14 DIAGNOSIS — E1165 Type 2 diabetes mellitus with hyperglycemia: Secondary | ICD-10-CM | POA: Diagnosis not present

## 2023-05-14 DIAGNOSIS — E1169 Type 2 diabetes mellitus with other specified complication: Secondary | ICD-10-CM

## 2023-05-14 DIAGNOSIS — R309 Painful micturition, unspecified: Secondary | ICD-10-CM | POA: Diagnosis not present

## 2023-05-14 DIAGNOSIS — F03A18 Unspecified dementia, mild, with other behavioral disturbance: Secondary | ICD-10-CM

## 2023-05-14 DIAGNOSIS — F339 Major depressive disorder, recurrent, unspecified: Secondary | ICD-10-CM

## 2023-05-14 DIAGNOSIS — G629 Polyneuropathy, unspecified: Secondary | ICD-10-CM

## 2023-05-14 DIAGNOSIS — E785 Hyperlipidemia, unspecified: Secondary | ICD-10-CM

## 2023-05-14 DIAGNOSIS — I10 Essential (primary) hypertension: Secondary | ICD-10-CM

## 2023-05-14 NOTE — Progress Notes (Signed)
Location:   [facility]  Nursing Home Room Number: 26-A Place of Service:  SNF [phone]) Provider:  Hazle Nordmann, NP  PCP: [name], MD  Patient Care Team: [name], MD as PCP - General (Internal Medicine) Ernesto Rutherford, MD (Ophthalmology) Claud Kelp, MD as Consulting Physician (General Surgery) Carlus Pavlov, MD as Consulting Physician (Internal Medicine)  Extended Emergency Contact Information Primary Emergency Contact: [name],[name] Address: [address]          [place] [address] [place] Home Phone: [phone] Mobile Phone: [phone] Relation: Spouse  Code Status:  DNR Goals of care: Advanced Directive information    05/14/2023    9:23 AM  Advanced Directives  Does Patient Have a Medical Advance Directive? Yes  Type of Estate agent of [place];Living will;Out of facility DNR (pink MOST or yellow form)  Does patient want to make changes to medical advance directive? No - Patient declined  Copy of Healthcare Power of Attorney in Chart? Yes - validated most recent copy scanned in chart (See row information)     Chief Complaint  Patient presents with   Medical Management of Chronic Issues    Routine Visit.    Immunizations    Discuss the need for [employer].   Health Maintenance    Discuss the need for Diabetic kidney evaluation.     HPI:  Pt is a 82 y.o. female seen today for medical management of chronic diseases.    She currently resides on the skilled nursing unit at [facility]. PMH: HTN, HLD, GERD, IBS, T2DM, polyneuropathy, DJD, osteopenia, Factor 5 mutation, breast cancer s/p  left lumpectomy 1996, incontinence, and anxiety.    Followed by palliative. Hospice discharged last year.    T2DM- A1c 8.7 (09/05) > was 7.2 (05/16)> was 7.0 (12/04)>, urine microalbumin 03/15/2023> normal, no hypoglycemic events, blood sugars 150-240's, metformin recently reduced/ glipizide increased due to  declining renal function, Januvia started 09/05, off Invokana due to frequent genitourinary infections Painful micturition- increased pain when urinating, CT abdomen/pelvis negative for hydronephrosis/calculi/neoplasm, recent urine culture negative for growth> past cultures grew yeast, remains on weekly diflucan and clotrimazole 1% daily prn> does not ask for medication, c/o symptoms today HTN- BUN/creat 40/1.41, GFR 37 05/08/2023, remains on metoprolol and lisinopril HLD- on pravastatin CKD- see above  Dementia-  MMSE 25/30 2021, BIMS score 15/15 (06/24)> was 15/15 (03/21), diagnosed with neurodegenerative disorder per neurology, ambulates with w/c, continues to have behaviors towards staff/husband Peripheral neuropathy- remains on gabapentin Depression- Na+ 13 05/08/2023, unsuccessful trial increased Zoloft, refuses medication change, remains on Zoloft  Recent blood pressures:  10/29- 115/66  10/22- 106/54  10/15- 118/75  Recent weights:  11/01- 168 lbs  10/16- 168.2 lbs  09/07- 164.4 lbs    Past Medical History:  Diagnosis Date   Allergy    [name]; allergy shots weekly.     Anxiety    Arthritis    DDD lumbar spine.     Cancer (HCC)    breast L x 2; skin basal cell carcinoma [name]).   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   Past Surgical History:  Procedure Laterality Date   BREAST LUMPECTOMY  1996   L breast cancer   BREAST  SURGERY N/A    Phreesia 12/12/2019   EYE SURGERY N/A    Phreesia 12/12/2019   MASTECTOMY  2002   Bilateral for breast cancer   VAGINAL HYSTERECTOMY  1985   DUB; uterine fibroids; ovaries intact.    Allergies  Allergen Reactions   Fruit & Vegetable Daily [Nutritional Supplements] Diarrhea   Mixed Ragweed    Onion Other (See Comments)     indigestion   Tree Extract Swelling   Sulfa Drugs Cross Reactors Rash    All over the body    Allergies as of 05/14/2023       Reactions   Fruit & Vegetable Daily [nutritional Supplements] Diarrhea   Mixed Ragweed    Onion Other (See Comments)   indigestion   Tree Extract Swelling   Sulfa Drugs Cross Reactors Rash   All over the body        Medication List        Accurate as of May 14, 2023  9:24 AM. If you have any questions, ask your nurse or doctor.          STOP taking these medications    doxycycline 100 MG EC tablet Commonly known as: DORYX Stopped by: Octavia Heir   guaiFENesin-dextromethorphan 100-10 MG/5ML syrup Commonly known as: ROBITUSSIN DM Stopped by: Margarethe Virgen E Declan Mier       TAKE these medications    acetaminophen 500 MG tablet Commonly known as: TYLENOL Take 1,000 mg by mouth every 12 (twelve) hours as needed.   acetaminophen 500 MG tablet Commonly known as: TYLENOL Take 1,000 mg by mouth every evening.   albuterol 108 (90 Base) MCG/ACT inhaler Commonly known as: VENTOLIN HFA Inhale 2 puffs into the lungs every 6 (six) hours as needed for wheezing or shortness of breath.   antiseptic oral rinse Liqd 30 mLs by Mouth Rinse route 2 (two) times daily. For dry mouth   ANUSOL-HC RE Place 1 application  rectally as needed.   aspirin 81 MG chewable tablet Chew by mouth daily.   calcium carbonate 500 MG chewable tablet Commonly known as: Tums Chew 1-2 tablets (200-400 mg of elemental calcium total) by mouth 3 (three) times daily as needed for indigestion or heartburn.   clotrimazole 1 % vaginal cream Commonly known as: GYNE-LOTRIMIN Place 1 Applicatorful vaginally as needed.   EYE ITCH RELIEF OP Place 1 drop into both eyes every 12 (twelve) hours as needed.   fluconazole 150 MG tablet Commonly known as: DIFLUCAN Take 150 mg by mouth as directed. Weekly on Tuesdays at bedtime.   fluticasone 50 MCG/ACT nasal spray Commonly known as:  FLONASE Place 1 spray into both nostrils as needed for allergies or rhinitis.   gabapentin 100 MG capsule Commonly known as: NEURONTIN Take 200 mg by mouth 2 (two) times daily.   glipiZIDE 10 MG 24 hr tablet Commonly known as: GLUCOTROL XL Take 1 tablet (10 mg total) by mouth daily with breakfast.   lisinopril 2.5 MG tablet Commonly known as: ZESTRIL Take 2.5 mg by mouth in the morning.   loperamide 2 MG tablet Commonly known as: IMODIUM A-D Take 2 mg by mouth every 12 (twelve) hours as needed for diarrhea or loose stools.   Melatonin 5 MG Caps Take 1 capsule by mouth daily. As needed for insomnia   metFORMIN 500 MG tablet Commonly known as: GLUCOPHAGE Take 250 mg by mouth every evening.   metFORMIN 500 MG tablet Commonly known as: GLUCOPHAGE Take 500 mg by mouth every morning.  metoprolol succinate 100 MG 24 hr tablet Commonly known as: TOPROL-XL Take 100 mg by mouth 2 (two) times daily. Take with or immediately following a meal.   montelukast 10 MG tablet Commonly known as: SINGULAIR Take 10 mg by mouth every morning.   pravastatin 20 MG tablet Commonly known as: PRAVACHOL Take 20 mg by mouth every evening.   sertraline 50 MG tablet Commonly known as: Zoloft Take 1 tablet (50 mg total) by mouth daily.   sitaGLIPtin 50 MG tablet Commonly known as: Januvia Take 1 tablet (50 mg total) by mouth daily.   zinc oxide 20 % ointment Apply 1 application  topically as needed (To buttocks after every incontinent epsiode and as needed for redness).        Review of Systems  Constitutional:  Negative for activity change and appetite change.  HENT:  Negative for sore throat and trouble swallowing.   Respiratory:  Negative for cough, shortness of breath and wheezing.   Cardiovascular:  Negative for chest pain and leg swelling.  Gastrointestinal:  Negative for abdominal distention and abdominal pain.  Genitourinary:  Positive for dysuria. Negative for frequency,  hematuria, vaginal bleeding and vaginal discharge.  Musculoskeletal:  Positive for gait problem.  Skin:  Negative for wound.  Neurological:  Negative for dizziness and headaches.  Psychiatric/Behavioral:  Positive for confusion and dysphoric mood. Negative for sleep disturbance. The patient is not nervous/anxious.     Immunization History  Administered Date(s) Administered   Fluad Quad(high Dose 65+) 05/03/2022   Influenza Split 03/19/2012, 03/19/2013   Influenza, High Dose Seasonal PF 04/09/2014, 03/22/2015, 03/24/2016, 03/29/2018, 04/16/2020, 05/09/2023   Influenza,inj,Quad PF,6+ Mos 04/09/2014, 03/22/2015, 03/24/2016   Influenza-Unspecified 03/19/2012, 03/19/2013, 04/09/2017, 03/24/2018, 04/16/2020   Moderna SARS-COV2 Booster Vaccination 05/16/2022   Moderna Sars-Covid-2 Vaccination 05/19/2021   PFIZER(Purple Top)SARS-COV-2 Vaccination 08/17/2019, 09/10/2019, 04/06/2020, 11/19/2020   Pfizer Covid-19 Vaccine Bivalent Booster 11yrs & up 11/18/2020, 12/21/2021   Pneumococcal Conjugate-13 11/05/2014   Pneumococcal Polysaccharide-23 07/10/2004, 12/12/2010, 05/02/2016   Pneumococcal-Unspecified 07/10/2004, 12/12/2010, 05/02/2016   Respiratory Syncytial Virus Vaccine,Recomb Aduvanted(Arexvy) 06/24/2022   Td 07/10/2009   Tdap 07/06/2022   Zoster Recombinant(Shingrix) 04/09/2017, 07/19/2017   Zoster, Live 07/10/2006   Pertinent  Health Maintenance Due  Topic Date Due   HEMOGLOBIN A1C  09/12/2023   FOOT EXAM  03/13/2024   OPHTHALMOLOGY EXAM  04/15/2024   INFLUENZA VACCINE  Completed   DEXA SCAN  Completed      07/31/2022    9:44 AM 01/10/2023    3:48 PM 02/02/2023    2:47 PM 03/14/2023    3:13 PM 04/18/2023    2:05 PM  Fall Risk  Falls in the past year? 0 0 0 0 0  Was there an injury with Fall? 0 0 0 0 0  Fall Risk Category Calculator 0 0 0 0 0  Patient at Risk for Falls Due to History of fall(s);Impaired mobility;Impaired balance/gait No Fall Risks History of fall(s);Impaired  balance/gait;Impaired mobility History of fall(s);Impaired balance/gait History of fall(s);Impaired balance/gait;Impaired mobility  Fall risk Follow up Falls evaluation completed Falls evaluation completed Falls evaluation completed;Education provided;Falls prevention discussed Falls evaluation completed;Education provided    Functional Status Survey:    Vitals:   05/14/23 0908  BP: 115/66  Pulse: 65  Resp: 18  Temp: 97.6 F (36.4 C)  SpO2: 96%  Weight: 168 lb (76.2 kg)  Height: 5' (1.524 m)   Body mass index is 32.81 kg/m. Physical Exam Vitals reviewed.  Constitutional:      General:  She is not in acute distress. HENT:     Head: Normocephalic.     Right Ear: There is no impacted cerumen.     Left Ear: There is no impacted cerumen.     Nose: Nose normal.     Mouth/Throat:     Mouth: Mucous membranes are moist.  Eyes:     General:        Right eye: No discharge.        Left eye: No discharge.  Cardiovascular:     Rate and Rhythm: Normal rate and regular rhythm.     Pulses: Normal pulses.     Heart sounds: Normal heart sounds.  Pulmonary:     Effort: Pulmonary effort is normal. No respiratory distress.     Breath sounds: Normal breath sounds. No wheezing.  Abdominal:     General: Bowel sounds are normal. There is no distension.     Palpations: Abdomen is soft.     Tenderness: There is no abdominal tenderness.  Musculoskeletal:     Cervical back: Neck supple.     Right lower leg: No edema.     Left lower leg: No edema.  Skin:    General: Skin is warm.     Capillary Refill: Capillary refill takes less than 2 seconds.  Neurological:     General: No focal deficit present.     Mental Status: She is alert. Mental status is at baseline.     Motor: Weakness present.     Gait: Gait abnormal.  Psychiatric:        Mood and Affect: Mood normal.     Labs reviewed: Recent Labs    11/23/22 0000 04/30/23 0000 05/03/23 0000  NA 139 137 138  K 4.5 4.1 4.5  CL 107 108  110*  CO2 22 17 16   BUN 21 32* 40*  CREATININE 1.3* 1.6* 1.4*  CALCIUM 8.8 8.5* 8.7   Recent Labs    06/12/22 0000  AST 14  ALT 12  ALKPHOS 122  ALBUMIN 4.2   Recent Labs    11/23/22 0000 04/30/23 0000 05/03/23 0000  WBC 9.2 14.4 11.8  NEUTROABS 6,017.00 11,160.00 8,803.00  HGB 12.0 11.2* 11.4*  HCT 36 34* 35*  PLT 323 328 402*   Lab Results  Component Value Date   TSH 3.100 08/12/2019   Lab Results  Component Value Date   HGBA1C 8.7 03/15/2023   Lab Results  Component Value Date   CHOL 191 06/12/2022   HDL 45 06/12/2022   LDLCALC 114 06/12/2022   TRIG 196 (A) 06/12/2022   CHOLHDL 4.4 12/19/2018    Significant Diagnostic Results in last 30 days:  No results found.  Assessment/Plan 1. Type 2 diabetes mellitus with hyperglycemia, without long-term current use of insulin (HCC) - A1c 8.7> was 7.2 - metformin reduced due to declining kidney function - 09/11 Januvia started - 09/17 urine microalbumin > normal - needs diabetic eye exam - A1c 12/02  2. Painful micturition - ongoing - past work up negative for UTI - does not ask for clotrimazole> will schedule 2x/week  3. Essential hypertension - controlled - cont lisinopril and metoprolol  4. Hyperlipidemia associated with type 2 diabetes mellitus (HCC) - LDL 114, goal < 70 - cont pravastatin  5. Stage 3b chronic kidney disease (HCC) - encourage hydration with water - avoid NSAIDS  6. Mild dementia with other behavioral disturbance, unspecified dementia type (HCC) - MMSE 25/30 - off hospice service - continues to have  periods of agitation/ poor memory - recommend neurology consult> could benefit from neurocognitive testing - not on medication - cont skilled nursing  7. Peripheral polyneuropathy - cont gabapentin  8. Recurrent depression (HCC) - related to living in SNF> feels " husband put her here"  - unsuccessful trial increased Zoloft - consider changing antidepressant if mood worsens -  cont zoloft    Family/ staff Communication: plan discussed with patient and nurse  Labs/tests ordered:  A1c and lipid panel 06/11/2023

## 2023-05-17 ENCOUNTER — Telehealth: Payer: Self-pay | Admitting: Diagnostic Neuroimaging

## 2023-05-17 NOTE — Telephone Encounter (Signed)
Patient husband called who has POA over the patient stated she is in assistance living and they informed him that she needs to have another brain MRI done to help with a plan of care for her.

## 2023-05-17 NOTE — Telephone Encounter (Signed)
Patient has not been here since 2021. At this point she would have to be referred to our practice as a new patient for whatever concerns are needed. I do see where they have placed this and I will route to the referrals team so they can keep and eye out.

## 2023-05-25 ENCOUNTER — Non-Acute Institutional Stay (SKILLED_NURSING_FACILITY): Payer: Medicare PPO | Admitting: Orthopedic Surgery

## 2023-05-25 ENCOUNTER — Encounter: Payer: Self-pay | Admitting: Orthopedic Surgery

## 2023-05-25 DIAGNOSIS — J302 Other seasonal allergic rhinitis: Secondary | ICD-10-CM | POA: Diagnosis not present

## 2023-05-25 DIAGNOSIS — B9689 Other specified bacterial agents as the cause of diseases classified elsewhere: Secondary | ICD-10-CM | POA: Diagnosis not present

## 2023-05-25 DIAGNOSIS — J038 Acute tonsillitis due to other specified organisms: Secondary | ICD-10-CM

## 2023-05-25 MED ORDER — AMOXICILLIN-POT CLAVULANATE 875-125 MG PO TABS: 1.0000 | ORAL_TABLET | Freq: Two times a day (BID) | ORAL | Status: AC

## 2023-05-25 NOTE — Progress Notes (Signed)
Location:  [facility] Room Number: 26-A Place of Service:  SNF [phone]) Provider:  Octavia Heir, NP   [name], MD  Patient Care Team: [name], MD as PCP - General (Internal Medicine) Ernesto Rutherford, MD (Ophthalmology) Claud Kelp, MD as Consulting Physician (General Surgery) Carlus Pavlov, MD as Consulting Physician (Internal Medicine)  Extended Emergency Contact Information Primary Emergency Contact: [name],[name] Address: [address]          [place] [address] [place] Home Phone: [phone] Mobile Phone: [phone] Relation: Spouse  Code Status:  DNR Goals of care: Advanced Directive information    05/25/2023    3:50 PM  Advanced Directives  Does Patient Have a Medical Advance Directive? Yes  Type of Estate agent of [place];Living will;Out of facility DNR (pink MOST or yellow form)  Does patient want to make changes to medical advance directive? No - Patient declined  Copy of Healthcare Power of Attorney in Chart? Yes - validated most recent copy scanned in chart (See row information)     Chief Complaint  Patient presents with   Acute Visit    Sore throat.     HPI:  Pt is a 82 y.o. female seen today for acute visit due to sore throat.   She currently resides on the skilled nursing unit at [facility]. PMH: HTN, HLD, GERD, IBS, T2DM, polyneuropathy, DJD, osteopenia, Factor 5 mutation, breast cancer s/p  left lumpectomy 1996, incontinence, and anxiety.   She has had mild sore throat > 7 days. Today, she woke up and also has increased clear nasal congestion and dry cough. She is able to swallow foods/fluids without difficulty. She points to left throat complaining of soreness and lump. Afebrile. Vitals stable.   Remains on flonase prn for allergies.   10/22 she was started on doxycycline x 10 days due to elevated WBC, suspected UTI.   Received flu vaccine 10/30 and covid  11/06.      Past Medical History:  Diagnosis Date   Allergy    [name]; allergy shots weekly.     Anxiety    Arthritis    DDD lumbar spine.     Cancer (HCC)    breast L x 2; skin basal cell carcinoma [name]).   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   Past Surgical History:  Procedure Laterality Date   BREAST LUMPECTOMY  1996   L breast cancer   BREAST SURGERY N/A    Phreesia 12/12/2019   EYE SURGERY N/A    Phreesia 12/12/2019   MASTECTOMY  2002   Bilateral for breast cancer   VAGINAL HYSTERECTOMY  1985   DUB; uterine fibroids; ovaries intact.    Allergies  Allergen Reactions   Fruit & Vegetable Daily [Nutritional Supplements] Diarrhea   Mixed Ragweed    Onion Other (See Comments)    indigestion   Tree Extract Swelling   Sulfa Drugs Cross Reactors Rash    All over the body    Outpatient Encounter Medications as of 05/25/2023  Medication Sig   acetaminophen (TYLENOL) 500 MG tablet Take 1,000 mg by mouth every 12 (twelve) hours as needed.   acetaminophen (TYLENOL) 500 MG tablet Take 1,000 mg by mouth  every evening.   albuterol (VENTOLIN HFA) 108 (90 Base) MCG/ACT inhaler Inhale 2 puffs into the lungs every 6 (six) hours as needed for wheezing or shortness of breath.   amoxicillin-clavulanate (AUGMENTIN) 875-125 MG tablet Take 1 tablet by mouth 2 (two) times daily for 10 days.   antiseptic oral rinse (BIOTENE) LIQD 30 mLs by Mouth Rinse route 2 (two) times daily. For dry mouth   aspirin 81 MG chewable tablet Chew by mouth daily.   calcium carbonate (TUMS) 500 MG chewable tablet Chew 1-2 tablets (200-400 mg of elemental calcium total) by mouth 3 (three) times daily as needed for indigestion or heartburn.   clotrimazole (GYNE-LOTRIMIN) 1 % vaginal cream Place 1  Applicatorful vaginally as needed.   clotrimazole (GYNE-LOTRIMIN) 1 % vaginal cream Place 1 Applicatorful vaginally 2 (two) times a week. Monday & Thursday   fluconazole (DIFLUCAN) 150 MG tablet Take 150 mg by mouth once a week. At bedtime on Tuesday   fluticasone (FLONASE) 50 MCG/ACT nasal spray Place 1 spray into both nostrils as needed for allergies or rhinitis.   gabapentin (NEURONTIN) 100 MG capsule Take 200 mg by mouth 2 (two) times daily.   glipiZIDE (GLUCOTROL XL) 10 MG 24 hr tablet Take 1 tablet (10 mg total) by mouth daily with breakfast.   Hydrocortisone Acetate (ANUSOL-HC RE) Place 1 application  rectally as needed.   Ketotifen Fumarate (EYE ITCH RELIEF OP) Place 1 drop into both eyes every 12 (twelve) hours as needed.   lisinopril (ZESTRIL) 2.5 MG tablet Take 2.5 mg by mouth in the morning.   loperamide (IMODIUM A-D) 2 MG tablet Take 2 mg by mouth every 12 (twelve) hours as needed for diarrhea or loose stools.   Melatonin 5 MG CAPS Take 1 capsule by mouth daily. As needed for insomnia   metFORMIN (GLUCOPHAGE) 500 MG tablet Take 250 mg by mouth every evening.   metFORMIN (GLUCOPHAGE) 500 MG tablet Take 500 mg by mouth every morning.   metoprolol succinate (TOPROL-XL) 100 MG 24 hr tablet Take 100 mg by mouth 2 (two) times daily. Take with or immediately following a meal.   montelukast (SINGULAIR) 10 MG tablet Take 10 mg by mouth every morning.   pravastatin (PRAVACHOL) 20 MG tablet Take 20 mg by mouth every evening.   sertraline (ZOLOFT) 50 MG tablet Take 1 tablet (50 mg total) by mouth daily.   sitaGLIPtin (JANUVIA) 50 MG tablet Take 1 tablet (50 mg total) by mouth daily.   zinc oxide 20 % ointment Apply 1 application  topically as needed (To buttocks after every incontinent epsiode and as needed for redness).   No facility-administered encounter medications on file as of 05/25/2023.    Review of Systems  Constitutional:  Positive for activity change. Negative for appetite change.   HENT:  Positive for congestion, sinus pain and sore throat. Negative for trouble swallowing.   Respiratory:  Positive for cough. Negative for shortness of breath and wheezing.   Cardiovascular:  Negative for chest pain and leg swelling.  Gastrointestinal:  Negative for diarrhea, nausea and vomiting.  Musculoskeletal:  Negative for myalgias.  Neurological:  Negative for dizziness and headaches.  Psychiatric/Behavioral:  Positive for confusion and dysphoric mood. Negative for sleep disturbance. The patient is not nervous/anxious.     Immunization History  Administered Date(s) Administered   Fluad Quad(high Dose 65+) 05/03/2022   Influenza Split 03/19/2012, 03/19/2013   Influenza, High Dose Seasonal PF 04/09/2014, 03/22/2015, 03/24/2016, 03/29/2018, 04/16/2020, 05/09/2023   Influenza,inj,Quad PF,6+ Mos 04/09/2014,  03/22/2015, 03/24/2016   Influenza-Unspecified 03/19/2012, 03/19/2013, 04/09/2017, 03/24/2018, 04/16/2020   Moderna Covid-19 Vaccine Bivalent Booster 72yrs & up 05/16/2023   Moderna SARS-COV2 Booster Vaccination 05/16/2022   Moderna Sars-Covid-2 Vaccination 05/19/2021   PFIZER(Purple Top)SARS-COV-2 Vaccination 08/17/2019, 09/10/2019, 04/06/2020, 11/19/2020   Pfizer Covid-19 Vaccine Bivalent Booster 63yrs & up 11/18/2020, 12/21/2021   Pneumococcal Conjugate-13 11/05/2014   Pneumococcal Polysaccharide-23 07/10/2004, 12/12/2010, 05/02/2016   Pneumococcal-Unspecified 07/10/2004, 12/12/2010, 05/02/2016   Respiratory Syncytial Virus Vaccine,Recomb Aduvanted(Arexvy) 06/24/2022   Td 07/10/2009   Tdap 07/06/2022   Zoster Recombinant(Shingrix) 04/09/2017, 07/19/2017   Zoster, Live 07/10/2006   Pertinent  Health Maintenance Due  Topic Date Due   HEMOGLOBIN A1C  09/12/2023   OPHTHALMOLOGY EXAM  04/15/2024   FOOT EXAM  05/13/2024   INFLUENZA VACCINE  Completed   DEXA SCAN  Completed      01/10/2023    3:48 PM 02/02/2023    2:47 PM 03/14/2023    3:13 PM 04/18/2023    2:05 PM 05/14/2023    12:34 PM  Fall Risk  Falls in the past year? 0 0 0 0 0  Was there an injury with Fall? 0 0 0 0 0  Fall Risk Category Calculator 0 0 0 0 0  Patient at Risk for Falls Due to No Fall Risks History of fall(s);Impaired balance/gait;Impaired mobility History of fall(s);Impaired balance/gait History of fall(s);Impaired balance/gait;Impaired mobility History of fall(s);Impaired balance/gait;Impaired mobility  Fall risk Follow up Falls evaluation completed Falls evaluation completed;Education provided;Falls prevention discussed Falls evaluation completed;Education provided  Falls evaluation completed;Education provided;Falls prevention discussed   Functional Status Survey:    Vitals:   05/25/23 1519  BP: 105/66  Pulse: 71  Resp: 18  Temp: 97.7 F (36.5 C)  SpO2: 90%  Weight: 168 lb (76.2 kg)  Height: 5' (1.524 m)   Body mass index is 32.81 kg/m. Physical Exam Vitals reviewed.  Constitutional:      General: She is not in acute distress. HENT:     Head: Normocephalic.     Right Ear: There is no impacted cerumen.     Left Ear: There is no impacted cerumen.     Nose:     Right Turbinates: Not enlarged or swollen.     Left Turbinates: Enlarged and swollen.     Mouth/Throat:     Mouth: Mucous membranes are moist.     Pharynx: Uvula midline.     Tonsils: No tonsillar exudate or tonsillar abscesses. 0 on the right. 2+ on the left.  Eyes:     General:        Right eye: No discharge.        Left eye: No discharge.  Cardiovascular:     Rate and Rhythm: Normal rate and regular rhythm.     Pulses: Normal pulses.     Heart sounds: Normal heart sounds.  Pulmonary:     Effort: Pulmonary effort is normal. No respiratory distress.     Breath sounds: Normal breath sounds. No wheezing, rhonchi or rales.  Abdominal:     General: Bowel sounds are normal.     Palpations: Abdomen is soft.  Musculoskeletal:     Cervical back: Neck supple.     Right lower leg: No edema.     Left lower leg: No  edema.  Lymphadenopathy:     Cervical: Cervical adenopathy present.  Skin:    General: Skin is warm.     Capillary Refill: Capillary refill takes less than 2 seconds.  Neurological:  General: No focal deficit present.     Mental Status: She is alert. Mental status is at baseline.     Motor: Weakness present.     Gait: Gait abnormal.     Comments: wheelchair  Psychiatric:        Mood and Affect: Mood normal.     Labs reviewed: Recent Labs    11/23/22 0000 04/30/23 0000 05/03/23 0000  NA 139 137 138  K 4.5 4.1 4.5  CL 107 108 110*  CO2 22 17 16   BUN 21 32* 40*  CREATININE 1.3* 1.6* 1.4*  CALCIUM 8.8 8.5* 8.7   Recent Labs    06/12/22 0000  AST 14  ALT 12  ALKPHOS 122  ALBUMIN 4.2   Recent Labs    11/23/22 0000 04/30/23 0000 05/03/23 0000  WBC 9.2 14.4 11.8  NEUTROABS 6,017.00 11,160.00 8,803.00  HGB 12.0 11.2* 11.4*  HCT 36 34* 35*  PLT 323 328 402*   Lab Results  Component Value Date   TSH 3.100 08/12/2019   Lab Results  Component Value Date   HGBA1C 8.7 03/15/2023   Lab Results  Component Value Date   CHOL 191 06/12/2022   HDL 45 06/12/2022   LDLCALC 114 06/12/2022   TRIG 196 (A) 06/12/2022   CHOLHDL 4.4 12/19/2018    Significant Diagnostic Results in last 30 days:  No results found.  Assessment/Plan 1. Acute bacterial tonsillitis - onset > 7 days - left tonsil enlarged 2+, no exudate/white patches, left neck tenderness - since unilateral will treat to cover potential abscess - amoxicillin-clavulanate (AUGMENTIN) 875-125 MG tablet; Take 1 tablet by mouth 2 (two) times daily for 10 days.  2. Seasonal allergies - left nasal turbinate swollen  - cont Flonase    Family/ staff Communication: plan discussed with patient   Labs/tests ordered:  none

## 2023-05-25 NOTE — Progress Notes (Signed)
Location:   [facility] Room Number: 26-A Place of Service:  SNF [phone]) Provider:  Hazle Nordmann, NP  PCP: [name], MD  Patient Care Team: [name], MD as PCP - General (Internal Medicine) Ernesto Rutherford, MD (Ophthalmology) Claud Kelp, MD as Consulting Physician (General Surgery) Carlus Pavlov, MD as Consulting Physician (Internal Medicine)  Extended Emergency Contact Information Primary Emergency Contact: [name],[name] Address: [address]          [place] [address] [place] Home Phone: [phone] Mobile Phone: [phone] Relation: Spouse  Code Status:  DNR Goals of care: Advanced Directive information    05/25/2023    3:50 PM  Advanced Directives  Does Patient Have a Medical Advance Directive? Yes  Type of Estate agent of [place];Living will;Out of facility DNR (pink MOST or yellow form)  Does patient want to make changes to medical advance directive? No - Patient declined  Copy of Healthcare Power of Attorney in Chart? Yes - validated most recent copy scanned in chart (See row information)     Chief Complaint  Patient presents with   Acute Visit    Sore throat.     HPI:  Pt is a 82 y.o. female seen today for an acute visit for    Past Medical History:  Diagnosis Date   Allergy    [name]; allergy shots weekly.     Anxiety    Arthritis    DDD lumbar spine.     Cancer (HCC)    breast L x 2; skin basal cell carcinoma [name]).   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   Past Surgical History:  Procedure Laterality Date   BREAST LUMPECTOMY  1996   L breast cancer   BREAST SURGERY N/A    Phreesia 12/12/2019   EYE SURGERY N/A    Phreesia 12/12/2019   MASTECTOMY  2002    Bilateral for breast cancer   VAGINAL HYSTERECTOMY  1985   DUB; uterine fibroids; ovaries intact.    Allergies  Allergen Reactions   Fruit & Vegetable Daily [Nutritional Supplements] Diarrhea   Mixed Ragweed    Onion Other (See Comments)    indigestion   Tree Extract Swelling   Sulfa Drugs Cross Reactors Rash    All over the body    Allergies as of 05/25/2023       Reactions   Fruit & Vegetable Daily [nutritional Supplements] Diarrhea   Mixed Ragweed    Onion Other (See Comments)   indigestion   Tree Extract Swelling   Sulfa Drugs Cross Reactors Rash   All over the body        Medication List        Accurate as of May 25, 2023  3:50 PM. If you have any questions, ask your nurse or doctor.          acetaminophen 500 MG tablet Commonly known as: TYLENOL Take 1,000 mg by mouth every 12 (twelve) hours as needed.   acetaminophen 500 MG tablet Commonly known as: TYLENOL Take 1,000 mg by mouth every evening.   albuterol 108 (90 Base) MCG/ACT inhaler Commonly known as: VENTOLIN HFA Inhale 2 puffs into the lungs every 6 (six) hours as needed for  wheezing or shortness of breath.   amoxicillin-clavulanate 875-125 MG tablet Commonly known as: AUGMENTIN Take 1 tablet by mouth 2 (two) times daily for 10 days. Started by: Octavia Heir   antiseptic oral rinse Liqd 30 mLs by Mouth Rinse route 2 (two) times daily. For dry mouth   ANUSOL-HC RE Place 1 application  rectally as needed.   aspirin 81 MG chewable tablet Chew by mouth daily.   calcium carbonate 500 MG chewable tablet Commonly known as: Tums Chew 1-2 tablets (200-400 mg of elemental calcium total) by mouth 3 (three) times daily as needed for indigestion or heartburn.   clotrimazole 1 % vaginal cream Commonly known as: GYNE-LOTRIMIN Place 1 Applicatorful vaginally as needed.   clotrimazole 1 % vaginal cream Commonly known as: GYNE-LOTRIMIN Place 1 Applicatorful vaginally 2 (two) times a week.  Monday & Thursday   EYE ITCH RELIEF OP Place 1 drop into both eyes every 12 (twelve) hours as needed.   fluconazole 150 MG tablet Commonly known as: DIFLUCAN Take 150 mg by mouth once a week. At bedtime on Tuesday   fluticasone 50 MCG/ACT nasal spray Commonly known as: FLONASE Place 1 spray into both nostrils as needed for allergies or rhinitis.   gabapentin 100 MG capsule Commonly known as: NEURONTIN Take 200 mg by mouth 2 (two) times daily.   glipiZIDE 10 MG 24 hr tablet Commonly known as: GLUCOTROL XL Take 1 tablet (10 mg total) by mouth daily with breakfast.   lisinopril 2.5 MG tablet Commonly known as: ZESTRIL Take 2.5 mg by mouth in the morning.   loperamide 2 MG tablet Commonly known as: IMODIUM A-D Take 2 mg by mouth every 12 (twelve) hours as needed for diarrhea or loose stools.   Melatonin 5 MG Caps Take 1 capsule by mouth daily. As needed for insomnia   metFORMIN 500 MG tablet Commonly known as: GLUCOPHAGE Take 250 mg by mouth every evening.   metFORMIN 500 MG tablet Commonly known as: GLUCOPHAGE Take 500 mg by mouth every morning.   metoprolol succinate 100 MG 24 hr tablet Commonly known as: TOPROL-XL Take 100 mg by mouth 2 (two) times daily. Take with or immediately following a meal.   montelukast 10 MG tablet Commonly known as: SINGULAIR Take 10 mg by mouth every morning.   pravastatin 20 MG tablet Commonly known as: PRAVACHOL Take 20 mg by mouth every evening.   sertraline 50 MG tablet Commonly known as: Zoloft Take 1 tablet (50 mg total) by mouth daily.   sitaGLIPtin 50 MG tablet Commonly known as: Januvia Take 1 tablet (50 mg total) by mouth daily.   zinc oxide 20 % ointment Apply 1 application  topically as needed (To buttocks after every incontinent epsiode and as needed for redness).        Review of Systems  Immunization History  Administered Date(s) Administered   Fluad Quad(high Dose 65+) 05/03/2022   Influenza Split  03/19/2012, 03/19/2013   Influenza, High Dose Seasonal PF 04/09/2014, 03/22/2015, 03/24/2016, 03/29/2018, 04/16/2020, 05/09/2023   Influenza,inj,Quad PF,6+ Mos 04/09/2014, 03/22/2015, 03/24/2016   Influenza-Unspecified 03/19/2012, 03/19/2013, 04/09/2017, 03/24/2018, 04/16/2020   Moderna SARS-COV2 Booster Vaccination 05/16/2022   Moderna Sars-Covid-2 Vaccination 05/19/2021   PFIZER(Purple Top)SARS-COV-2 Vaccination 08/17/2019, 09/10/2019, 04/06/2020, 11/19/2020   Pfizer Covid-19 Vaccine Bivalent Booster 67yrs & up 11/18/2020, 12/21/2021   Pneumococcal Conjugate-13 11/05/2014   Pneumococcal Polysaccharide-23 07/10/2004, 12/12/2010, 05/02/2016   Pneumococcal-Unspecified 07/10/2004, 12/12/2010, 05/02/2016   Respiratory Syncytial Virus Vaccine,Recomb Aduvanted(Arexvy) 06/24/2022   Td 07/10/2009  Tdap 07/06/2022   Zoster Recombinant(Shingrix) 04/09/2017, 07/19/2017   Zoster, Live 07/10/2006   Pertinent  Health Maintenance Due  Topic Date Due   HEMOGLOBIN A1C  09/12/2023   OPHTHALMOLOGY EXAM  04/15/2024   FOOT EXAM  05/13/2024   INFLUENZA VACCINE  Completed   DEXA SCAN  Completed      01/10/2023    3:48 PM 02/02/2023    2:47 PM 03/14/2023    3:13 PM 04/18/2023    2:05 PM 05/14/2023   12:34 PM  Fall Risk  Falls in the past year? 0 0 0 0 0  Was there an injury with Fall? 0 0 0 0 0  Fall Risk Category Calculator 0 0 0 0 0  Patient at Risk for Falls Due to No Fall Risks History of fall(s);Impaired balance/gait;Impaired mobility History of fall(s);Impaired balance/gait History of fall(s);Impaired balance/gait;Impaired mobility History of fall(s);Impaired balance/gait;Impaired mobility  Fall risk Follow up Falls evaluation completed Falls evaluation completed;Education provided;Falls prevention discussed Falls evaluation completed;Education provided  Falls evaluation completed;Education provided;Falls prevention discussed   Functional Status Survey:    Vitals:   05/25/23 1519  BP: 105/66   Pulse: 71  Resp: 18  Temp: 97.7 F (36.5 C)  SpO2: 90%  Weight: 168 lb (76.2 kg)  Height: 5' (1.524 m)   Body mass index is 32.81 kg/m. Physical Exam  Labs reviewed: Recent Labs    11/23/22 0000 04/30/23 0000 05/03/23 0000  NA 139 137 138  K 4.5 4.1 4.5  CL 107 108 110*  CO2 22 17 16   BUN 21 32* 40*  CREATININE 1.3* 1.6* 1.4*  CALCIUM 8.8 8.5* 8.7   Recent Labs    06/12/22 0000  AST 14  ALT 12  ALKPHOS 122  ALBUMIN 4.2   Recent Labs    11/23/22 0000 04/30/23 0000 05/03/23 0000  WBC 9.2 14.4 11.8  NEUTROABS 6,017.00 11,160.00 8,803.00  HGB 12.0 11.2* 11.4*  HCT 36 34* 35*  PLT 323 328 402*   Lab Results  Component Value Date   TSH 3.100 08/12/2019   Lab Results  Component Value Date   HGBA1C 8.7 03/15/2023   Lab Results  Component Value Date   CHOL 191 06/12/2022   HDL 45 06/12/2022   LDLCALC 114 06/12/2022   TRIG 196 (A) 06/12/2022   CHOLHDL 4.4 12/19/2018    Significant Diagnostic Results in last 30 days:  No results found.  Assessment/Plan Family/ staff Communication:   Labs/tests ordered:

## 2023-06-12 ENCOUNTER — Encounter: Payer: Self-pay | Admitting: Orthopedic Surgery

## 2023-06-12 ENCOUNTER — Non-Acute Institutional Stay (SKILLED_NURSING_FACILITY): Payer: Self-pay | Admitting: Orthopedic Surgery

## 2023-06-12 DIAGNOSIS — E1121 Type 2 diabetes mellitus with diabetic nephropathy: Secondary | ICD-10-CM | POA: Diagnosis not present

## 2023-06-12 DIAGNOSIS — G629 Polyneuropathy, unspecified: Secondary | ICD-10-CM

## 2023-06-12 DIAGNOSIS — E785 Hyperlipidemia, unspecified: Secondary | ICD-10-CM

## 2023-06-12 DIAGNOSIS — R309 Painful micturition, unspecified: Secondary | ICD-10-CM

## 2023-06-12 DIAGNOSIS — F339 Major depressive disorder, recurrent, unspecified: Secondary | ICD-10-CM

## 2023-06-12 DIAGNOSIS — F03A18 Unspecified dementia, mild, with other behavioral disturbance: Secondary | ICD-10-CM | POA: Diagnosis not present

## 2023-06-12 DIAGNOSIS — J029 Acute pharyngitis, unspecified: Secondary | ICD-10-CM | POA: Diagnosis not present

## 2023-06-12 DIAGNOSIS — E1169 Type 2 diabetes mellitus with other specified complication: Secondary | ICD-10-CM

## 2023-06-12 DIAGNOSIS — I10 Essential (primary) hypertension: Secondary | ICD-10-CM

## 2023-06-12 DIAGNOSIS — N1832 Chronic kidney disease, stage 3b: Secondary | ICD-10-CM

## 2023-06-12 LAB — LIPID PANEL
Cholesterol: 113 (ref 0–200)
HDL: 38 (ref 35–70)
LDL Cholesterol: 53
Triglycerides: 135 (ref 40–160)

## 2023-06-12 LAB — HEMOGLOBIN A1C: Hemoglobin A1C: 7.4

## 2023-06-12 MED ORDER — OMEPRAZOLE 20 MG PO CPDR: 20.0000 mg | DELAYED_RELEASE_CAPSULE | Freq: Every day | ORAL | Status: DC

## 2023-06-12 NOTE — Progress Notes (Signed)
Location:  [facility] Room Number: 26-A Place of Service:  SNF [id number]) Provider:  Nikola Marone Roland Earl, MD  Patient Care Team: [name], MD as PCP - General (Internal Medicine) Ernesto Rutherford, MD (Ophthalmology) Claud Kelp, MD as Consulting Physician (General Surgery) Carlus Pavlov, MD as Consulting Physician (Internal Medicine)  Extended Emergency Contact Information Primary Emergency Contact: [name],[name] Address: [address]          [place] [address] [place] Home Phone: [phone] Mobile Phone: [phone] Relation: Spouse  Code Status:  DNR Goals of care: Advanced Directive information    06/12/2023   10:17 AM  Advanced Directives  Does Patient Have a Medical Advance Directive? Yes  Type of Estate agent of Index;Living will;Out of facility DNR (pink MOST or yellow form)  Does patient want to make changes to medical advance directive? No - Patient declined  Copy of Healthcare Power of Attorney in Chart? Yes - validated most recent copy scanned in chart (See row information)  Pre-existing out of facility DNR order (yellow form or pink MOST form) Pink MOST/Yellow Form most recent copy in chart - Physician notified to receive inpatient order     Chief Complaint  Patient presents with   Routine    HPI:  Pt is a 82 y.o. female seen today for medical management of chronic diseases.    She currently resides on the skilled nursing unit at [facility]. PMH: HTN, HLD, GERD, IBS, T2DM, polyneuropathy, DJD, osteopenia, Factor 5 mutation, breast cancer s/p  left lumpectomy 1996, incontinence, and anxiety.    Followed by palliative. Hospice discharged last year.     T2DM- A1c 7.4 (12/02)> was  8.7 (09/05), urine microalbumin 03/15/2023> normal, no hypoglycemic events, blood sugars 150-240's, off Invokana due to frequent genitourinary infections, remains on reduced metformin due to  declining renal function, glipizide and Januvia Sore throat- 11/15 prescribed Augmentin x 10 days for suspected left sided tonsillitis, symptoms improved after finishing antibiotic, symptoms not occurring daily, able to swallowing fluids/foods without difficulty  Painful micturition- increased pain when urinating, CT abdomen/pelvis negative for hydronephrosis/calculi/neoplasm, recent urine culture negative for growth> past cultures grew yeast, remains on weekly diflucan and clotrimazole 1% daily prn> does not ask for medication, continues to c/o symptoms weekly per nursing  Dementia-  MMSE 25/30 2021, BIMS score 15/15 (06/24)> was 15/15 (03/21), diagnosed with neurodegenerative disorder per neurology, ambulates with w/c, continues to have behaviors towards staff/husband, new neurology referral made> scheduled next year HTN- BUN/creat 40/1.41, GFR 37 05/08/2023, remains on metoprolol and lisinopril HLD- total 113, LDL 53 (12/02), on pravastatin CKD- see above  Peripheral neuropathy- remains on gabapentin Depression- Na+ 13 05/08/2023, unsuccessful trial increased Zoloft, refuses medication change, remains on Zoloft  No recent falls or injuries.   Recent blood pressures:  11/26- 111/62  11/19- 121/79  11/12- 105/66  Recent weights:   11/01- 168 lbs  10/16- 168.2 lbs  09/07- 164.4 lbs    Past Medical History:  Diagnosis Date   Allergy    [name]; allergy shots weekly.     Anxiety    Arthritis    DDD lumbar spine.     Cancer (HCC)    breast L x 2; skin basal cell carcinoma [name]).   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   Past Surgical History:  Procedure Laterality Date   BREAST LUMPECTOMY  1996   L breast cancer   BREAST SURGERY N/A    [employer] 12/12/2019   EYE SURGERY  N/A    [employer] 12/12/2019   MASTECTOMY  2002   Bilateral for breast cancer   VAGINAL HYSTERECTOMY  1985   DUB; uterine fibroids; ovaries intact.    Allergies  Allergen Reactions   Fruit & Vegetable Daily [Nutritional Supplements] Diarrhea   Mixed Ragweed    Onion Other (See Comments)    indigestion   Tree Extract Swelling   Sulfa Drugs Cross Reactors Rash    All over the body    Outpatient Encounter Medications as of 06/12/2023  Medication Sig   acetaminophen (TYLENOL) 500 MG tablet Take 1,000 mg by mouth every 12 (twelve) hours as needed.   acetaminophen (TYLENOL) 500 MG tablet Take 1,000 mg by mouth every evening.   albuterol (VENTOLIN HFA) 108 (90 Base) MCG/ACT inhaler Inhale 2 puffs into the lungs every 6 (six) hours as needed for wheezing or shortness of breath.   antiseptic oral rinse (BIOTENE) LIQD 30 mLs by Mouth Rinse route 2 (two) times daily. For dry mouth   aspirin 81 MG chewable tablet Chew by mouth daily.   clotrimazole (GYNE-LOTRIMIN) 1 % vaginal cream Place 1 Applicatorful vaginally as needed.   clotrimazole (GYNE-LOTRIMIN) 1 % vaginal cream Place 1 Applicatorful vaginally 2 (two) times a week. Monday & Thursday   fluconazole (DIFLUCAN) 150 MG tablet Take 150 mg by mouth once a week. At bedtime on Tuesday   fluticasone (FLONASE) 50 MCG/ACT nasal spray Place 1 spray into both nostrils as needed for allergies or rhinitis.   gabapentin (NEURONTIN) 100 MG capsule Take 200 mg by mouth 2 (two) times daily.   glipiZIDE (GLUCOTROL XL) 10 MG 24 hr tablet Take 1 tablet (10 mg total) by mouth daily with breakfast.   Hydrocortisone Acetate (ANUSOL-HC RE) Place 1 application  rectally as needed.   Ketotifen Fumarate (EYE ITCH RELIEF OP) Place 1 drop into both eyes every 12 (twelve) hours as needed.   lisinopril (ZESTRIL) 2.5 MG tablet Take 2.5 mg by mouth in the morning.   loperamide (IMODIUM A-D) 2 MG tablet Take 2 mg by mouth every 12 (twelve) hours as needed for diarrhea or  loose stools.   Melatonin 5 MG CAPS Take 1 capsule by mouth daily. As needed for insomnia   metFORMIN (GLUCOPHAGE) 500 MG tablet Take 250 mg by mouth every evening.   metFORMIN (GLUCOPHAGE) 500 MG tablet Take 500 mg by mouth every morning.   metoprolol succinate (TOPROL-XL) 100 MG 24 hr tablet Take 100 mg by mouth 2 (two) times daily. Take with or immediately following a meal.   montelukast (SINGULAIR) 10 MG tablet Take 10 mg by mouth every morning.   pravastatin (PRAVACHOL) 20 MG tablet Take 20 mg by mouth every evening.   sertraline (ZOLOFT) 50 MG tablet Take 1 tablet (50 mg total) by mouth daily.   sitaGLIPtin (JANUVIA) 50 MG tablet Take 1 tablet (50 mg total) by mouth daily.   zinc oxide 20 % ointment Apply 1 application  topically as needed (To buttocks after every incontinent epsiode and as needed for redness).   calcium carbonate (TUMS) 500 MG chewable tablet Chew 1-2 tablets (200-400 mg of elemental calcium total) by mouth 3 (three) times daily as needed for indigestion or heartburn. (Patient  not taking: Reported on 06/12/2023)   No facility-administered encounter medications on file as of 06/12/2023.    Review of Systems  Constitutional:  Negative for activity change and appetite change.  HENT:  Positive for sore throat. Negative for congestion and trouble swallowing.   Eyes:  Negative for visual disturbance.  Respiratory:  Negative for cough, shortness of breath and wheezing.   Cardiovascular:  Negative for chest pain and leg swelling.  Gastrointestinal:  Negative for abdominal distention and abdominal pain.  Genitourinary:  Positive for dysuria. Negative for frequency, hematuria, vaginal bleeding and vaginal discharge.  Musculoskeletal:  Positive for gait problem and myalgias.  Skin:  Negative for wound.  Neurological:  Positive for weakness. Negative for dizziness and headaches.  Psychiatric/Behavioral:  Positive for confusion and dysphoric mood. Negative for sleep disturbance.  The patient is not nervous/anxious.     Immunization History  Administered Date(s) Administered   Fluad Quad(high Dose 65+) 05/03/2022   Influenza Split 03/19/2012, 03/19/2013   Influenza, High Dose Seasonal PF 04/09/2014, 03/22/2015, 03/24/2016, 03/29/2018, 04/16/2020, 05/09/2023   Influenza,inj,Quad PF,6+ Mos 04/09/2014, 03/22/2015, 03/24/2016   Influenza-Unspecified 03/19/2012, 03/19/2013, 04/09/2017, 03/24/2018, 04/16/2020   Moderna Covid-19 Vaccine Bivalent Booster 54yrs & up 05/16/2023   Moderna SARS-COV2 Booster Vaccination 05/16/2022   Moderna Sars-Covid-2 Vaccination 05/19/2021   PFIZER(Purple Top)SARS-COV-2 Vaccination 08/17/2019, 09/10/2019, 04/06/2020, 11/19/2020   Pfizer Covid-19 Vaccine Bivalent Booster 53yrs & up 11/18/2020, 12/21/2021   Pneumococcal Conjugate-13 11/05/2014   Pneumococcal Polysaccharide-23 07/10/2004, 12/12/2010, 05/02/2016   Pneumococcal-Unspecified 07/10/2004, 12/12/2010, 05/02/2016   Respiratory Syncytial Virus Vaccine,Recomb Aduvanted(Arexvy) 06/24/2022   Td 07/10/2009   Tdap 07/06/2022   Zoster Recombinant(Shingrix) 04/09/2017, 07/19/2017   Zoster, Live 07/10/2006   Pertinent  Health Maintenance Due  Topic Date Due   HEMOGLOBIN A1C  09/12/2023   OPHTHALMOLOGY EXAM  04/15/2024   FOOT EXAM  05/13/2024   INFLUENZA VACCINE  Completed   DEXA SCAN  Completed      01/10/2023    3:48 PM 02/02/2023    2:47 PM 03/14/2023    3:13 PM 04/18/2023    2:05 PM 05/14/2023   12:34 PM  Fall Risk  Falls in the past year? 0 0 0 0 0  Was there an injury with Fall? 0 0 0 0 0  Fall Risk Category Calculator 0 0 0 0 0  Patient at Risk for Falls Due to No Fall Risks History of fall(s);Impaired balance/gait;Impaired mobility History of fall(s);Impaired balance/gait History of fall(s);Impaired balance/gait;Impaired mobility History of fall(s);Impaired balance/gait;Impaired mobility  Fall risk Follow up Falls evaluation completed Falls evaluation completed;Education  provided;Falls prevention discussed Falls evaluation completed;Education provided  Falls evaluation completed;Education provided;Falls prevention discussed   Functional Status Survey:    Vitals:   06/12/23 1015  BP: 111/62  Pulse: 67  Resp: 17  Temp: (!) 97.1 F (36.2 C)  SpO2: 97%  Weight: 168 lb (76.2 kg)  Height: 5' (1.524 m)   Body mass index is 32.81 kg/m. Physical Exam Vitals reviewed.  Constitutional:      General: She is not in acute distress. HENT:     Head: Normocephalic.     Right Ear: There is no impacted cerumen.     Left Ear: There is no impacted cerumen.     Nose: Nose normal.     Mouth/Throat:     Mouth: Mucous membranes are moist.     Palate: No mass and lesions.     Pharynx: Uvula midline. No pharyngeal swelling, posterior oropharyngeal erythema or uvula swelling.  Tonsils: No tonsillar exudate or tonsillar abscesses. 0 on the right. 0 on the left.  Eyes:     General:        Right eye: No discharge.        Left eye: No discharge.  Cardiovascular:     Rate and Rhythm: Normal rate and regular rhythm.     Pulses: Normal pulses.     Heart sounds: Normal heart sounds.  Pulmonary:     Effort: Pulmonary effort is normal.     Breath sounds: Normal breath sounds.  Abdominal:     General: Bowel sounds are normal.     Palpations: Abdomen is soft.  Musculoskeletal:     Cervical back: Neck supple.     Right lower leg: No edema.     Left lower leg: No edema.  Lymphadenopathy:     Cervical: No cervical adenopathy.  Skin:    General: Skin is warm.     Capillary Refill: Capillary refill takes less than 2 seconds.  Neurological:     General: No focal deficit present.     Mental Status: She is alert. Mental status is at baseline.     Motor: Weakness present.     Gait: Gait abnormal.     Comments: wheelchair  Psychiatric:        Mood and Affect: Mood normal.     Labs reviewed: Recent Labs    11/23/22 0000 04/30/23 0000 05/03/23 0000  NA 139 137  138  K 4.5 4.1 4.5  CL 107 108 110*  CO2 22 17 16   BUN 21 32* 40*  CREATININE 1.3* 1.6* 1.4*  CALCIUM 8.8 8.5* 8.7   No results for input(s): "AST", "ALT", "ALKPHOS", "BILITOT", "PROT", "ALBUMIN" in the last 8760 hours. Recent Labs    11/23/22 0000 04/30/23 0000 05/03/23 0000  WBC 9.2 14.4 11.8  NEUTROABS 6,017.00 11,160.00 8,803.00  HGB 12.0 11.2* 11.4*  HCT 36 34* 35*  PLT 323 328 402*   Lab Results  Component Value Date   TSH 3.100 08/12/2019   Lab Results  Component Value Date   HGBA1C 8.7 03/15/2023   Lab Results  Component Value Date   CHOL 191 06/12/2022   HDL 45 06/12/2022   LDLCALC 114 06/12/2022   TRIG 196 (A) 06/12/2022   CHOLHDL 4.4 12/19/2018    Significant Diagnostic Results in last 30 days:  No results found.  Assessment/Plan 1. Controlled type 2 diabetes mellitus with diabetic nephropathy, without long-term current use of insulin (HCC) - A1c now 7.4> was 8.7 - cont to limit carbs and sugars in diet - urine microalbumin done 03/15/2023 - Diabetic eye exam 04/16/2023 - cont reduced metformin due to renal decline - cont glipizide and Januvia  2. Sore throat - 11/15 given Augmentin x 10 days for left sided tonsillitis - continues to have intermittent sore throat - exam unremarkable, voice normal - ? Dry mouth or GERD - will trial omeprazole x 7 days - cont Biotene as prescribed - consider CT head/neck if symptoms persist - omeprazole (PRILOSEC) 20 MG capsule; Take 1 capsule (20 mg total) by mouth daily for 7 days.  3. Painful micturition - ongoing - past urology workup negative for UTI> some yeast - cont clotrimazole prn  4. Mild dementia with other behavioral disturbance, unspecified dementia type (HCC) - MMSE 25/30 - followed by hospice in past - continues to have period of agitation/ ? fixated on symptoms> see above - neurology referral last month> scheduled 2025 - cont  skilled nursing    5. Essential hypertension - controlled  lisinopril and metoprolol  6. Hyperlipidemia associated with type 2 diabetes mellitus (HCC) - LDL 113, LDL 53 06/11/2023 - cont pravastatin  7. Stage 3b chronic kidney disease (HCC) - GFR 37 05/03/2023 - encourage hydration with water - avoid NSAIDS  8. Peripheral polyneuropathy 0- stable with gabapentin  9. Recurrent depression (HCC) - associated with living condition, poor health - refused past zoloft increase    Family/ staff Communication: plan discussed with patient and nurse  Labs/tests ordered:  none

## 2023-07-18 ENCOUNTER — Encounter: Payer: Self-pay | Admitting: Orthopedic Surgery

## 2023-07-18 ENCOUNTER — Non-Acute Institutional Stay (SKILLED_NURSING_FACILITY): Payer: Medicare PPO | Admitting: Orthopedic Surgery

## 2023-07-18 DIAGNOSIS — R309 Painful micturition, unspecified: Secondary | ICD-10-CM

## 2023-07-18 DIAGNOSIS — F339 Major depressive disorder, recurrent, unspecified: Secondary | ICD-10-CM | POA: Diagnosis not present

## 2023-07-18 NOTE — Progress Notes (Signed)
 Location:  [facility] Room Number: 26/A Place of Service:  SNF [id number]) Provider:  Greig FORBES Cluster, NP   Charlanne Fredia CROME, MD  Patient Care Team: Charlanne Fredia CROME, MD as PCP - General (Internal Medicine) Octavia Charleston, MD (Ophthalmology) Gail Favorite, MD as Consulting Physician (General Surgery) Trixie File, MD as Consulting Physician (Internal Medicine)  Extended Emergency Contact Information Primary Emergency Contact: [name],[name] Address: [address]          [place] [address] [place] Home Phone: [phone] Mobile Phone: [phone] Relation: Spouse  Code Status:  DNR Goals of care: Advanced Directive information    06/12/2023   10:17 AM  Advanced Directives  Does Patient Have a Medical Advance Directive? Yes  Type of Estate Agent of [place];Living will;Out of facility DNR (pink MOST or yellow form)  Does patient want to make changes to medical advance directive? No - Patient declined  Copy of Healthcare Power of Attorney in Chart? Yes - validated most recent copy scanned in chart (See row information)  Pre-existing out of facility DNR order (yellow form or pink MOST form) Pink MOST/Yellow Form most recent copy in chart - Physician notified to receive inpatient order     Chief Complaint  Patient presents with   Acute Visit    Painful micturition    HPI:  Pt is a 83 y.o. female seen today for acute visit due to ongoing painful micturition.   She currently resides on the skilled nursing unit at [facility]. PMH: HTN, HLD, GERD, IBS, T2DM, polyneuropathy, DJD, osteopenia, Factor 5 mutation, breast cancer s/p  left lumpectomy 1996, incontinence, and anxiety.   H/o intermittent dysuria and sometimes pressure to peri area > 1 year. She has been evaluated by [facility] in past, overall workup was normal. She was diagnosed with candiduria and vaginal yeast. Clotrimazole 1% cream prn recommended  for symptoms. Medication is also scheduled 2x/week. Invokana  was discontinued due to concerns medication was contributing to genitourinary symptoms. Dr. Charlanne also started weekly diflucan  as well.  She forgets to ask for clotrimazole cream. Staff does not always offer it either. She forgets discussing symptoms with me 01/03. Admits to drinking fluids well. Afebrile. Vitals stable.     Past Medical History:  Diagnosis Date   Allergy    Fleeta Smock; allergy shots weekly.     Anxiety    Arthritis    DDD lumbar spine.     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   Past Surgical History:  Procedure Laterality Date   BREAST LUMPECTOMY  1996   L breast cancer   BREAST SURGERY N/A    [employer]reesia 12/12/2019   EYE SURGERY N/A    [employer]reesia 12/12/2019   MASTECTOMY  2002   Bilateral for breast cancer   VAGINAL HYSTERECTOMY  1985   DUB; uterine fibroids; ovaries intact.    Allergies  Allergen Reactions   Fruit & Vegetable Daily [Nutritional Supplements] Diarrhea   Mixed Ragweed    Onion Other (See Comments)    indigestion   Tree Extract Swelling   Sulfa Drugs Cross Reactors Rash    All over the  body    Outpatient Encounter Medications as of 07/18/2023  Medication Sig   acetaminophen  (TYLENOL ) 500 MG tablet Take 1,000 mg by mouth every 12 (twelve) hours as needed.   acetaminophen  (TYLENOL ) 500 MG tablet Take 1,000 mg by mouth every evening.   albuterol  (VENTOLIN  HFA) 108 (90 Base) MCG/ACT inhaler Inhale 2 puffs into the lungs every 6 (six) hours as needed for wheezing or shortness of breath.   antiseptic oral rinse (BIOTENE) LIQD 30 mLs by Mouth Rinse route 2 (two) times daily. For dry mouth   aspirin 81 MG chewable tablet  Chew by mouth daily.   calcium  carbonate (TUMS) 500 MG chewable tablet Chew 1-2 tablets (200-400 mg of elemental calcium  total) by mouth 3 (three) times daily as needed for indigestion or heartburn. (Patient not taking: Reported on 06/12/2023)   clotrimazole (GYNE-LOTRIMIN) 1 % vaginal cream Place 1 Applicatorful vaginally as needed.   clotrimazole (GYNE-LOTRIMIN) 1 % vaginal cream Place 1 Applicatorful vaginally 2 (two) times a week. Monday & Thursday   fluconazole  (DIFLUCAN ) 150 MG tablet Take 150 mg by mouth once a week. At bedtime on Tuesday   fluticasone  (FLONASE ) 50 MCG/ACT nasal spray Place 1 spray into both nostrils as needed for allergies or rhinitis.   gabapentin  (NEURONTIN ) 100 MG capsule Take 200 mg by mouth 2 (two) times daily.   glipiZIDE  (GLUCOTROL  XL) 10 MG 24 hr tablet Take 1 tablet (10 mg total) by mouth daily with breakfast.   Hydrocortisone  Acetate (ANUSOL -HC RE) Place 1 application  rectally as needed.   Ketotifen Fumarate (EYE ITCH RELIEF OP) Place 1 drop into both eyes every 12 (twelve) hours as needed.   lisinopril  (ZESTRIL ) 2.5 MG tablet Take 2.5 mg by mouth in the morning.   loperamide (IMODIUM A-D) 2 MG tablet Take 2 mg by mouth every 12 (twelve) hours as needed for diarrhea or loose stools.   Melatonin 5 MG CAPS Take 1 capsule by mouth daily. As needed for insomnia   metFORMIN  (GLUCOPHAGE ) 500 MG tablet Take 250 mg by mouth every evening.   metFORMIN  (GLUCOPHAGE ) 500 MG tablet Take 500 mg by mouth every morning.   metoprolol  succinate (TOPROL -XL) 100 MG 24 hr tablet Take 100 mg by mouth 2 (two) times daily. Take with or immediately following a meal.   montelukast  (SINGULAIR ) 10 MG tablet Take 10 mg by mouth every morning.   omeprazole  (PRILOSEC) 20 MG capsule Take 1 capsule (20 mg total) by mouth daily for 7 days.   pravastatin  (PRAVACHOL ) 20 MG tablet Take 20 mg by mouth every evening.   sertraline  (ZOLOFT ) 50 MG tablet Take 1 tablet (50 mg total) by mouth daily.    sitaGLIPtin  (JANUVIA ) 50 MG tablet Take 1 tablet (50 mg total) by mouth daily.   zinc oxide 20 % ointment Apply 1 application  topically as needed (To buttocks after every incontinent epsiode and as needed for redness).   No facility-administered encounter medications on file as of 07/18/2023.    Review of Systems  Constitutional:  Negative for fever.  Respiratory:  Negative for shortness of breath.   Cardiovascular:  Negative for chest pain.  Gastrointestinal:  Negative for abdominal distention and abdominal pain.  Genitourinary:  Positive for dysuria, pelvic pain and vaginal pain. Negative for frequency, hematuria, vaginal bleeding and vaginal discharge.  Psychiatric/Behavioral:  Positive for confusion and dysphoric mood. The patient is not nervous/anxious.     Immunization History  Administered Date(s) Administered   Fluad Quad(high Dose 65+) 05/03/2022  Influenza Split 03/19/2012, 03/19/2013   Influenza, High Dose Seasonal PF 04/09/2014, 03/22/2015, 03/24/2016, 03/29/2018, 04/16/2020, 05/09/2023   Influenza,inj,Quad PF,6+ Mos 04/09/2014, 03/22/2015, 03/24/2016   Influenza-Unspecified 03/19/2012, 03/19/2013, 04/09/2017, 03/24/2018, 04/16/2020   Moderna Covid-19 Vaccine Bivalent Booster 71yrs & up 05/16/2023   Moderna SARS-COV2 Booster Vaccination 05/16/2022   Moderna Sars-Covid-2 Vaccination 05/19/2021   PFIZER(Purple Top)SARS-COV-2 Vaccination 08/17/2019, 09/10/2019, 04/06/2020, 11/19/2020   Pfizer Covid-19 Vaccine Bivalent Booster 45yrs & up 11/18/2020, 12/21/2021   Pneumococcal Conjugate-13 11/05/2014   Pneumococcal Polysaccharide-23 07/10/2004, 12/12/2010, 05/02/2016   Pneumococcal-Unspecified 07/10/2004, 12/12/2010, 05/02/2016   Respiratory Syncytial Virus Vaccine,Recomb Aduvanted(Arexvy) 06/24/2022   Td 07/10/2009   Tdap 07/06/2022   Zoster Recombinant(Shingrix) 04/09/2017, 07/19/2017   Zoster, Live 07/10/2006   Pertinent  Health Maintenance Due  Topic Date Due    HEMOGLOBIN A1C  09/12/2023   OPHTHALMOLOGY EXAM  04/15/2024   FOOT EXAM  05/13/2024   INFLUENZA VACCINE  Completed   DEXA SCAN  Completed      01/10/2023    3:48 PM 02/02/2023    2:47 PM 03/14/2023    3:13 PM 04/18/2023    2:05 PM 05/14/2023   12:34 PM  Fall Risk  Falls in the past year? 0 0 0 0 0  Was there an injury with Fall? 0 0 0 0 0  Fall Risk Category Calculator 0 0 0 0 0  Patient at Risk for Falls Due to No Fall Risks History of fall(s);Impaired balance/gait;Impaired mobility History of fall(s);Impaired balance/gait History of fall(s);Impaired balance/gait;Impaired mobility History of fall(s);Impaired balance/gait;Impaired mobility  Fall risk Follow up Falls evaluation completed Falls evaluation completed;Education provided;Falls prevention discussed Falls evaluation completed;Education provided  Falls evaluation completed;Education provided;Falls prevention discussed   Functional Status Survey:    Vitals:   07/18/23 1604  BP: (!) 152/74  Pulse: 68  Resp: 17  Temp: (!) 97 F (36.1 C)  SpO2: 99%  Weight: 168 lb (76.2 kg)  Height: 5' (1.524 m)   Body mass index is 32.81 kg/m. Physical Exam Vitals reviewed.  Constitutional:      General: She is not in acute distress. HENT:     Head: Normocephalic.  Eyes:     General:        Right eye: No discharge.        Left eye: No discharge.  Cardiovascular:     Rate and Rhythm: Normal rate and regular rhythm.     Pulses: Normal pulses.     Heart sounds: Normal heart sounds.  Pulmonary:     Effort: Pulmonary effort is normal.     Breath sounds: Normal breath sounds.  Abdominal:     Palpations: Abdomen is soft.  Genitourinary:    Comments: Refused exam Musculoskeletal:        General: Normal range of motion.     Cervical back: Neck supple.  Skin:    General: Skin is warm.     Capillary Refill: Capillary refill takes less than 2 seconds.  Neurological:     General: No focal deficit present.     Mental Status: She is  alert. Mental status is at baseline.     Motor: Weakness present.     Gait: Gait abnormal.  Psychiatric:        Mood and Affect: Mood normal.     Labs reviewed: Recent Labs    11/23/22 0000 04/30/23 0000 05/03/23 0000  NA 139 137 138  K 4.5 4.1 4.5  CL 107 108 110*  CO2 22 17 16   BUN  21 32* 40*  CREATININE 1.3* 1.6* 1.4*  CALCIUM  8.8 8.5* 8.7   No results for input(s): AST, ALT, ALKPHOS, BILITOT, PROT, ALBUMIN in the last 8760 hours. Recent Labs    11/23/22 0000 04/30/23 0000 05/03/23 0000  WBC 9.2 14.4 11.8  NEUTROABS 6,017.00 11,160.00 8,803.00  HGB 12.0 11.2* 11.4*  HCT 36 34* 35*  PLT 323 328 402*   Lab Results  Component Value Date   TSH 3.100 08/12/2019   Lab Results  Component Value Date   HGBA1C 8.7 03/15/2023   Lab Results  Component Value Date   CHOL 191 06/12/2022   HDL 45 06/12/2022   LDLCALC 114 06/12/2022   TRIG 196 (A) 06/12/2022   CHOLHDL 4.4 12/19/2018    Significant Diagnostic Results in last 30 days:  No results found.  Assessment/Plan 1. Urinary pain (Primary) - ongoing - past urology workup normal except for candida and vaginal yeast - cont Diflucan  weekly and clotrimazole cream prn - will try estrace  for vaginal atrophy - consider Lyrica  or amitriptyline in future if pain persists  2. Recurrent depression (HCC) - forgetful - todays PHQ score 5 - upset she is in SNF - refused Zoloft  increases in past - cont current dose Zoloft   - consider amitriptyline for depression and chronic pain in future    Family/ staff Communication: plan discussed with patient and nurse  Labs/tests ordered:  none

## 2023-07-19 MED ORDER — ESTRADIOL 0.1 MG/GM VA CREA: 1.0000 | TOPICAL_CREAM | VAGINAL | Status: DC

## 2023-08-03 ENCOUNTER — Encounter: Payer: Self-pay | Admitting: Orthopedic Surgery

## 2023-08-03 ENCOUNTER — Non-Acute Institutional Stay (SKILLED_NURSING_FACILITY): Payer: Self-pay | Admitting: Orthopedic Surgery

## 2023-08-03 DIAGNOSIS — F03A18 Unspecified dementia, mild, with other behavioral disturbance: Secondary | ICD-10-CM

## 2023-08-03 DIAGNOSIS — F339 Major depressive disorder, recurrent, unspecified: Secondary | ICD-10-CM

## 2023-08-03 DIAGNOSIS — E1165 Type 2 diabetes mellitus with hyperglycemia: Secondary | ICD-10-CM | POA: Diagnosis not present

## 2023-08-03 DIAGNOSIS — I10 Essential (primary) hypertension: Secondary | ICD-10-CM | POA: Diagnosis not present

## 2023-08-03 DIAGNOSIS — E1169 Type 2 diabetes mellitus with other specified complication: Secondary | ICD-10-CM

## 2023-08-03 DIAGNOSIS — N1832 Chronic kidney disease, stage 3b: Secondary | ICD-10-CM

## 2023-08-03 DIAGNOSIS — E785 Hyperlipidemia, unspecified: Secondary | ICD-10-CM

## 2023-08-03 DIAGNOSIS — G629 Polyneuropathy, unspecified: Secondary | ICD-10-CM

## 2023-08-03 DIAGNOSIS — R309 Painful micturition, unspecified: Secondary | ICD-10-CM | POA: Diagnosis not present

## 2023-08-03 MED ORDER — FLUCONAZOLE 100 MG PO TABS
100.0000 mg | ORAL_TABLET | Freq: Two times a day (BID) | ORAL | Status: AC
Start: 2023-08-03 — End: 2023-08-17

## 2023-08-03 NOTE — Progress Notes (Signed)
Location:  [facility] Room Number: 26/A Place of Service:  SNF [phone]) Provider:  Octavia Heir, NP   [name], MD  Patient Care Team: [name], MD as PCP - General (Internal Medicine) Ernesto Rutherford, MD (Ophthalmology) Claud Kelp, MD as Consulting Physician (General Surgery) Carlus Pavlov, MD as Consulting Physician (Internal Medicine)  Extended Emergency Contact Information Primary Emergency Contact: [name],[name] Address: [address]          [place] [address] [place] Home Phone: [phone] Mobile Phone: [phone] Relation: Spouse  Code Status:  DNR Goals of care: Advanced Directive information    06/12/2023   10:17 AM  Advanced Directives  Does Patient Have a Medical Advance Directive? Yes  Type of Estate agent of [place];Living will;Out of facility DNR (pink MOST or yellow form)  Does patient want to make changes to medical advance directive? No - Patient declined  Copy of Healthcare Power of Attorney in Chart? Yes - validated most recent copy scanned in chart (See row information)  Pre-existing out of facility DNR order (yellow form or pink MOST form) Pink MOST/Yellow Form most recent copy in chart - Physician notified to receive inpatient order     Chief Complaint  Patient presents with   Medical Management of Chronic Issues    HPI:  Pt is a 83 y.o. female seen today for medical management of chronic diseases.    She currently resides on the skilled nursing unit at [facility]. PMH: HTN, HLD, GERD, IBS, T2DM, polyneuropathy, DJD, osteopenia, Factor 5 mutation, breast cancer s/p  left lumpectomy 1996, incontinence, and anxiety.    Followed by palliative. Hospice discharged last year.    Painful micturition- increased pain when urinating> " feels like a metal cheese grater", CT abdomen/pelvis negative for hydronephrosis/calculi/neoplasm, recent urine culture negative for  growth> past cultures grew yeast, remains on weekly diflucan and clotrimazole 1% 2x/week and daily prn T2DM- A1c 7.4 (12/02)> was  8.7 (09/05), urine microalbumin 03/15/2023> normal, no hypoglycemic events, blood sugars 170-200's, off Invokana due to frequent genitourinary infections, remains on reduced metformin due to declining renal function, glipizide and Januvia Dementia-  MMSE 25/30 2021, BIMS score 15/15 (12/27)> was 15/15 (06/24), diagnosed with neurodegenerative disorder per neurology, ambulates with w/c, continues to have behaviors towards staff/husband HTN- BUN/creat 40/1.41, GFR 37 05/08/2023, remains on metoprolol and lisinopril HLD- total 113, LDL 53 (12/02), on pravastatin CKD- see above  Peripheral neuropathy- remains on gabapentin Depression- Na+ 13 05/08/2023, associated with current health state and living in SNF, remains on Zoloft  No recent falls or injuries.   Recent blood pressures:  01/21- 152/84  01/14- 123/67  01/07- 152/74  Recent weight:  01/01- 166.6 lbs  12/03- 168 lbs  11/01- 168.2 lbs       Past Medical History:  Diagnosis Date   Allergy    [name]; allergy shots weekly.     Anxiety    Arthritis    DDD lumbar spine.     Cancer (HCC)    breast L x 2; skin basal cell carcinoma [name]).   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   Past Surgical History:  Procedure Laterality Date   BREAST LUMPECTOMY  1996  L breast cancer   BREAST SURGERY N/A    [employer]reesia 12/12/2019   EYE SURGERY N/A    [employer]reesia 12/12/2019   MASTECTOMY  2002   Bilateral for breast cancer   VAGINAL HYSTERECTOMY  1985   DUB; uterine fibroids; ovaries intact.    Allergies  Allergen Reactions   Fruit & Vegetable Daily [Nutritional Supplements] Diarrhea   Mixed Ragweed     Onion Other (See Comments)    indigestion   Tree Extract Swelling   Sulfa Drugs Cross Reactors Rash    All over the body    Outpatient Encounter Medications as of 08/03/2023  Medication Sig   acetaminophen (TYLENOL) 500 MG tablet Take 1,000 mg by mouth every 12 (twelve) hours as needed.   acetaminophen (TYLENOL) 500 MG tablet Take 1,000 mg by mouth every evening.   albuterol (VENTOLIN HFA) 108 (90 Base) MCG/ACT inhaler Inhale 2 puffs into the lungs every 6 (six) hours as needed for wheezing or shortness of breath.   antiseptic oral rinse (BIOTENE) LIQD 30 mLs by Mouth Rinse route 2 (two) times daily. For dry mouth   aspirin 81 MG chewable tablet Chew by mouth daily.   calcium carbonate (TUMS) 500 MG chewable tablet Chew 1-2 tablets (200-400 mg of elemental calcium total) by mouth 3 (three) times daily as needed for indigestion or heartburn. (Patient not taking: Reported on 06/12/2023)   clotrimazole (GYNE-LOTRIMIN) 1 % vaginal cream Place 1 Applicatorful vaginally as needed.   clotrimazole (GYNE-LOTRIMIN) 1 % vaginal cream Place 1 Applicatorful vaginally 2 (two) times a week. Monday & Thursday   estradiol (ESTRACE VAGINAL) 0.1 MG/GM vaginal cream Place 1 Applicatorful vaginally 2 (two) times a week.   fluconazole (DIFLUCAN) 150 MG tablet Take 150 mg by mouth once a week. At bedtime on Tuesday   fluticasone (FLONASE) 50 MCG/ACT nasal spray Place 1 spray into both nostrils as needed for allergies or rhinitis.   gabapentin (NEURONTIN) 100 MG capsule Take 200 mg by mouth 2 (two) times daily.   glipiZIDE (GLUCOTROL XL) 10 MG 24 hr tablet Take 1 tablet (10 mg total) by mouth daily with breakfast.   Hydrocortisone Acetate (ANUSOL-HC RE) Place 1 application  rectally as needed.   Ketotifen Fumarate (EYE ITCH RELIEF OP) Place 1 drop into both eyes every 12 (twelve) hours as needed.   lisinopril (ZESTRIL) 2.5 MG tablet Take 2.5 mg by mouth in the morning.   loperamide (IMODIUM A-D) 2 MG tablet Take 2  mg by mouth every 12 (twelve) hours as needed for diarrhea or loose stools.   Melatonin 5 MG CAPS Take 1 capsule by mouth daily. As needed for insomnia   metFORMIN (GLUCOPHAGE) 500 MG tablet Take 250 mg by mouth every evening.   metFORMIN (GLUCOPHAGE) 500 MG tablet Take 500 mg by mouth every morning.   metoprolol succinate (TOPROL-XL) 100 MG 24 hr tablet Take 100 mg by mouth 2 (two) times daily. Take with or immediately following a meal.   montelukast (SINGULAIR) 10 MG tablet Take 10 mg by mouth every morning.   omeprazole (PRILOSEC) 20 MG capsule Take 1 capsule (20 mg total) by mouth daily for 7 days.   pravastatin (PRAVACHOL) 20 MG tablet Take 20 mg by mouth every evening.   sertraline (ZOLOFT) 50 MG tablet Take 1 tablet (50 mg total) by mouth daily.   sitaGLIPtin (JANUVIA) 50 MG tablet Take 1 tablet (50 mg total) by mouth daily.   zinc oxide 20 % ointment Apply 1 application  topically as  needed (To buttocks after every incontinent epsiode and as needed for redness).   No facility-administered encounter medications on file as of 08/03/2023.    Review of Systems  Constitutional:  Negative for activity change and appetite change.  HENT:  Negative for sore throat and trouble swallowing.   Eyes:  Negative for visual disturbance.  Respiratory:  Negative for cough, shortness of breath and wheezing.   Cardiovascular:  Negative for chest pain and leg swelling.  Gastrointestinal:  Negative for abdominal distention and abdominal pain.  Genitourinary:  Positive for vaginal pain. Negative for dysuria and frequency.  Musculoskeletal:  Positive for gait problem.  Skin:  Negative for wound.  Neurological:  Positive for weakness. Negative for dizziness and headaches.  Psychiatric/Behavioral:  Positive for confusion and dysphoric mood. Negative for sleep disturbance. The patient is not nervous/anxious.     Immunization History  Administered Date(s) Administered   Fluad Quad(high Dose 65+) 05/03/2022    Influenza Split 03/19/2012, 03/19/2013   Influenza, High Dose Seasonal PF 04/09/2014, 03/22/2015, 03/24/2016, 03/29/2018, 04/16/2020, 05/09/2023   Influenza,inj,Quad PF,6+ Mos 04/09/2014, 03/22/2015, 03/24/2016   Influenza-Unspecified 03/19/2012, 03/19/2013, 04/09/2017, 03/24/2018, 04/16/2020   Moderna Covid-19 Vaccine Bivalent Booster 75yrs & up 05/16/2023   Moderna SARS-COV2 Booster Vaccination 05/16/2022   Moderna Sars-Covid-2 Vaccination 05/19/2021   PFIZER(Purple Top)SARS-COV-2 Vaccination 08/17/2019, 09/10/2019, 04/06/2020, 11/19/2020   Pfizer Covid-19 Vaccine Bivalent Booster 50yrs & up 11/18/2020, 12/21/2021   Pneumococcal Conjugate-13 11/05/2014   Pneumococcal Polysaccharide-23 07/10/2004, 12/12/2010, 05/02/2016   Pneumococcal-Unspecified 07/10/2004, 12/12/2010, 05/02/2016   Respiratory Syncytial Virus Vaccine,Recomb Aduvanted(Arexvy) 06/24/2022   Td 07/10/2009   Tdap 07/06/2022   Zoster Recombinant(Shingrix) 04/09/2017, 07/19/2017   Zoster, Live 07/10/2006   Pertinent  Health Maintenance Due  Topic Date Due   HEMOGLOBIN A1C  09/12/2023   OPHTHALMOLOGY EXAM  04/15/2024   FOOT EXAM  05/13/2024   INFLUENZA VACCINE  Completed   DEXA SCAN  Completed      02/02/2023    2:47 PM 03/14/2023    3:13 PM 04/18/2023    2:05 PM 05/14/2023   12:34 PM 07/19/2023   10:42 AM  Fall Risk  Falls in the past year? 0 0 0 0 0  Was there an injury with Fall? 0 0 0 0 0  Fall Risk Category Calculator 0 0 0 0 0  Patient at Risk for Falls Due to History of fall(s);Impaired balance/gait;Impaired mobility History of fall(s);Impaired balance/gait History of fall(s);Impaired balance/gait;Impaired mobility History of fall(s);Impaired balance/gait;Impaired mobility History of fall(s);Impaired balance/gait;Impaired mobility  Fall risk Follow up Falls evaluation completed;Education provided;Falls prevention discussed Falls evaluation completed;Education provided  Falls evaluation completed;Education  provided;Falls prevention discussed Falls evaluation completed;Education provided;Falls prevention discussed   Functional Status Survey:    Vitals:   08/03/23 1023  BP: (!) 152/84  Pulse: 72  Resp: 18  Temp: 97.7 F (36.5 C)  SpO2: 99%  Weight: 168 lb (76.2 kg)   Body mass index is 32.81 kg/m. Physical Exam Vitals reviewed. Exam conducted with a chaperone present.  Constitutional:      General: She is not in acute distress. HENT:     Head: Normocephalic.  Eyes:     General:        Right eye: No discharge.        Left eye: No discharge.  Cardiovascular:     Rate and Rhythm: Normal rate and regular rhythm.     Pulses: Normal pulses.     Heart sounds: Normal heart sounds.  Pulmonary:     Effort:  Pulmonary effort is normal.     Breath sounds: Normal breath sounds.  Abdominal:     General: Bowel sounds are normal.     Palpations: Abdomen is soft.  Genitourinary:    Labia:        Right: Rash and tenderness present. No lesion or injury.        Left: Rash and tenderness present. No lesion or injury.      Urethra: Urethral swelling present.     Vagina: Vaginal discharge and tenderness present.     Comments: White discharge in vaginal canal Musculoskeletal:        General: Normal range of motion.     Cervical back: Neck supple.  Skin:    General: Skin is warm.     Capillary Refill: Capillary refill takes less than 2 seconds.  Neurological:     General: No focal deficit present.     Mental Status: She is alert. Mental status is at baseline.     Motor: Weakness present.     Gait: Gait abnormal.  Psychiatric:        Mood and Affect: Mood normal.     Labs reviewed: Recent Labs    11/23/22 0000 04/30/23 0000 05/03/23 0000  NA 139 137 138  K 4.5 4.1 4.5  CL 107 108 110*  CO2 22 17 16   BUN 21 32* 40*  CREATININE 1.3* 1.6* 1.4*  CALCIUM 8.8 8.5* 8.7   No results for input(s): "AST", "ALT", "ALKPHOS", "BILITOT", "PROT", "ALBUMIN" in the last 8760 hours. Recent  Labs    11/23/22 0000 04/30/23 0000 05/03/23 0000  WBC 9.2 14.4 11.8  NEUTROABS 6,017.00 11,160.00 8,803.00  HGB 12.0 11.2* 11.4*  HCT 36 34* 35*  PLT 323 328 402*   Lab Results  Component Value Date   TSH 3.100 08/12/2019   Lab Results  Component Value Date   HGBA1C 8.7 03/15/2023   Lab Results  Component Value Date   CHOL 191 06/12/2022   HDL 45 06/12/2022   LDLCALC 114 06/12/2022   TRIG 196 (A) 06/12/2022   CHOLHDL 4.4 12/19/2018    Significant Diagnostic Results in last 30 days:  No results found.  Assessment/Plan 1. Painful micturition (Primary) - increased erythema, swelling, tenderness to inner labial folds, white discharge in vaginal canal - suspect yeast infection - hold diflucan 150 mg weekly - start fluconazole 100 mg po BID x 14 days, then resume diflucan 150 mg weekly - discontinue clotrimazole 1% vaginal cream 2x/week and daily prn - start clotrimazole 1% vaginal cream- apply to vaginal folds QAM x 21 days, then 3x/week  2. Type 2 diabetes mellitus with hyperglycemia, without long-term current use of insulin (HCC) - controlled - no hypoglycemia - urine microalbumin 03/15/2023 - Diabetic eye exam 04/16/2023 - cont reduced metformin due to renal decline - cont glipizide and Januvia  3. Mild dementia with other behavioral disturbance, unspecified dementia type (HCC) - MMSE 25/30 - discontinue neurology consult - continues to have mood of anger and depression  - not on medication - cont skilled nursing  4. Essential hypertension - controlled with lisinopril  5. Hyperlipidemia associated with type 2 diabetes mellitus (HCC) - LDL 53 06/11/2023 - cont pravastatin  6. Stage 3b chronic kidney disease (HCC) - encourage hydration - avoid NSAIDS  7. Peripheral polyneuropathy - cont gabapentin  8. Recurrent depression (HCC) - associated with living in SNF and declining health - refused increased Zoloft - refused trying another antidepressant -  Na+ stable  Total time: 46 minutes. Greater than 50% of total time spent doing patient education regarding vaginal/urinary pain, T2DM, HTN, HLD, CKD and depression including symptom/medication management.     Family/ staff Communication: plan discussed with patient and nurse  Labs/tests ordered:  none

## 2023-08-03 NOTE — Progress Notes (Signed)
Subjective:    Patient ID: [name] [name], female    DOB: [birth year], 83 y.o.   MRN: [id number]   HPI:  Patient is an 83 year old female seen today for a routine visit and worsening vaginal pain. Patient states that she has been having severe vaginal pain with urination and tactile pressure. This is a recurrent problem ongoing for a year. The problem has remained unchanged with medication management. The patient was recently seen on 07/17/2022 for this same issue and was prescribe Estrace.  Patient had an extensive urology workup completed at [facility] about six months ago where she was diagnosed with candiduria and vaginal yeast. Patient currently takes Diflucan weekly and Clomtimazole 1% cream as needed for symptom management however, she states that she has not been receiving the Clotrimazole 1% cream. She also endorses mid back pain that is intermittent. She states that her pain is worse when sitting in her wheelchair for long periods of time but resolves on it's own.   She is a resident at [facility] on the skilled nursing unit. PMH includes: Candiduria, vaginal yeast, Hypertension, hyperlipidemia, GERD, IBS-D, controlled Type II Diabetes,  Degenerative disc disease, osteopenia, Factor X mutation breast cancer s/p lumpectomy (1996), Non-melanoma skin cancer, peripheral neuropathy, Neurodegenerative disorder, incontinence, anxiety and depression.  Review of Symptoms:   Constitutional:  Negative for fever, loss of appetite, and activity change. Respiratory:  Negative for shortness of breath and wheezing. .   Cardiovascular:  Negative for chest pain.  Musculoskeletal: Positive for gait alteration. Positive for back pain.  Skin: Positive for rash and erythema.  Gastrointestinal:  Negative for abdominal distension and abdominal pain.  Genitourinary:  Positive for dysuria, pelvic pain and vaginal pain. Negative for frequency, hematuria, vaginal bleeding and vaginal discharge.   Neurological: Positive for weakness. Denies dizziness. Denies headaches.  Psychiatric/Behavioral: The patient is not nevous or anxious.        Objective:   Physical Exam HENT:     Head: Normocephalic.     Mouth/Throat:     Dentition: Abnormal dentition.     Pharynx: Oropharynx is clear.  Cardiovascular:     Rate and Rhythm: Normal rate and regular rhythm.     Pulses: Normal pulses.     Heart sounds: Normal heart sounds, S1 normal and S2 normal.     Comments: No murmur.  Pulmonary:     Effort: Pulmonary effort is normal.  Genitourinary:    Labia:        Right: Rash and tenderness present.        Left: Rash and tenderness present.      Vagina: Vaginal discharge present.  Musculoskeletal:     Cervical back: Neck supple.     Thoracic back: Normal.     Lumbar back: Normal.     Comments: No swelling or tenderness present.   Skin:    General: Skin is warm.     Capillary Refill: Capillary refill takes less than 2 seconds.     Findings: Rash present.  Neurological:     Mental Status: She is alert. Mental status is at baseline.     Motor: Weakness present.     Comments: Weakness in bilateral lower extremities.   Psychiatric:        Attention and Perception: Attention normal.        Mood and Affect: Mood normal.        Behavior: Behavior is cooperative.        Assessment &  Plan:  1. Controlled type 2 diabetes mellitus with diabetic nephropathy, without long-term current use of insulin (HCC)  -Continues to improve A1c level.  A1C at  now at goal  7.4.   -Encourage a carb conscious diet by limiting the intake of carbohydrates and sugars -Participate activity as tolerated for exercise -Continue metformin, glipizide, and Januvia -Diabetic Eye exam on (04/16/2023) -Urine microalbumin done on (03/15/2023)  2.Hypertension - BP trends are at goal. 152/84 today on (08/02/2022).  SBP goal 155> and DBP 90> -Continue Lisinopril and metoprolol.   3. Painful Micturition -Suspect  yeast infection - Order Clotrimazole 1% cream as a scheduled medication for the daytime. Apply topically to vaginal folds three times a day for 21 days.  - Discontinue Clotrimazole 1% cream vaginal applicator, two times a week  -Discontinue Estrace -Start Fluconazole (Diflucan) 100mg  PO BID X 14 days   -To avoid moisture, patient should refrain from wearing brief at bedtime to prevent moisture for 7 days  4. Mild dementia with other behavioral disturbance, unspecified dementia type [facility])  - Neurology follow-up discontinued - Continue care in skilled nursing   5.Stage 3b chronic kidney disease (HCC)  -GFR of 37 on 05/03/23 -Patient encouraged to hydrate with water -Avoid NSAIDs   6. Hyperlipidemia associated with type 2 diabetes mellitus (HCC) - LDL 53 on 06/11/2023. At goal <70.  -Continue with pravastatin.   8. Peripheral polyneuropathy - Pain managed on gabapentin -Continue tylenol as needed.     9. Recurrent depression (HCC) - Patient is not satisfied with living condition. - Patient takes Zoloft. Pt refused increase in dose.

## 2023-08-06 ENCOUNTER — Telehealth: Payer: Self-pay | Admitting: Diagnostic Neuroimaging

## 2023-08-06 NOTE — Telephone Encounter (Signed)
Pt's husband cx appt per PCP. Will call back to r/s if need be

## 2023-08-13 ENCOUNTER — Encounter: Payer: Self-pay | Admitting: Orthopedic Surgery

## 2023-08-13 ENCOUNTER — Non-Acute Institutional Stay (SKILLED_NURSING_FACILITY): Payer: Self-pay | Admitting: Orthopedic Surgery

## 2023-08-13 DIAGNOSIS — B3731 Acute candidiasis of vulva and vagina: Secondary | ICD-10-CM

## 2023-08-13 DIAGNOSIS — E1165 Type 2 diabetes mellitus with hyperglycemia: Secondary | ICD-10-CM | POA: Diagnosis not present

## 2023-08-13 DIAGNOSIS — R309 Painful micturition, unspecified: Secondary | ICD-10-CM | POA: Diagnosis not present

## 2023-08-13 NOTE — Progress Notes (Signed)
Location:   [facility]  Nursing Home Room Number: 26-A Place of Service:  SNF [phone]) Provider:  Hazle Nordmann, NP  PCP: [name] Gammon, MD  Patient Care Team: [name] Gammon, MD as PCP - General (Internal Medicine) [name] Rutherford, MD (Ophthalmology) [name] Kelp, MD as Consulting Physician (General Surgery) [name] Pavlov, MD as Consulting Physician (Internal Medicine)  Extended Emergency Contact Information Primary Emergency Contact: [name],[name] Address: [address]          [place] [address] [place] Home Phone: [phone] Mobile Phone: [phone] Relation: Spouse  Code Status:  DNR Goals of care: Advanced Directive information    08/13/2023   10:29 AM  Advanced Directives  Does Patient Have a Medical Advance Directive? Yes  Type of Estate agent of [place];Living will;Out of facility DNR (pink MOST or yellow form)  Does patient want to make changes to medical advance directive? No - Patient declined  Copy of Healthcare Power of Attorney in Chart? No - copy requested     Chief Complaint  Patient presents with   Acute Visit    Yeast infection.     HPI:The patient is an 83 year old female with type two diabetes who presents with persistent pain while urinating due to a yeast infection.  She was diagnosed with a yeast infection on August 03, 2023, after experiencing increased pain while urinating. [name] [name] has had painful micturition ongoing for 18 months. Past urology workup showed urine cultures negative for bacterial growth but positive for yeast. Initial treatment included weekly Diflucan and clomatriazole cream twice weekly, but her symptoms of pain with urination persisted. On January 24, a vaginal exam revealed increased redness and white discharge. She was started on fluconazole 100 mg orally twice daily for 14 days and clomatriazole cream 1% every morning. She reports improved symptoms but still experiences some  infrequent pain with urination.  She has a history of type two diabetes with a last A1c of 7.4. She is currently taking metformin twice daily. She was previously on Invokana, which was discontinued due to frequent genitourinary symptoms.   Past Medical History:  Diagnosis Date   Allergy    [name] [name]; allergy shots weekly.     Anxiety    Arthritis    DDD lumbar spine.     Cancer (HCC)    breast L x 2; skin basal cell carcinoma [name] [name]).   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   Past Surgical History:  Procedure Laterality Date   BREAST LUMPECTOMY  1996   L breast cancer   BREAST SURGERY N/A    [name] [name] 12/12/2019   EYE SURGERY N/A    [name] [name] 12/12/2019   MASTECTOMY  2002   Bilateral for breast cancer   VAGINAL HYSTERECTOMY  1985   DUB; uterine fibroids; ovaries intact.    Allergies  Allergen Reactions   Fruit & Vegetable Daily [Nutritional Supplements] Diarrhea   Mixed Ragweed    Onion Other (See Comments)    indigestion   Tree Extract Swelling   Sulfa Drugs Cross Reactors Rash    All over the body    Allergies as of 08/13/2023       Reactions   Fruit & Vegetable Daily [nutritional Supplements] Diarrhea  Mixed Ragweed    Onion Other (See Comments)   indigestion   Tree Extract Swelling   Sulfa Drugs Cross Reactors Rash   All over the body        Medication List        Accurate as of August 13, 2023 10:29 AM. If you have any questions, ask your nurse or doctor.          STOP taking these medications    montelukast 10 MG tablet Commonly known as: SINGULAIR Stopped by: [name] [name]   omeprazole 20 MG capsule Commonly known as: PRILOSEC Stopped by: [name] [name]       TAKE these medications    acetaminophen 500 MG tablet Commonly known as:  TYLENOL Take 1,000 mg by mouth every 12 (twelve) hours as needed.   acetaminophen 500 MG tablet Commonly known as: TYLENOL Take 1,000 mg by mouth every evening.   albuterol 108 (90 Base) MCG/ACT inhaler Commonly known as: VENTOLIN HFA Inhale 2 puffs into the lungs every 6 (six) hours as needed for wheezing or shortness of breath.   antiseptic oral rinse Liqd 30 mLs by Mouth Rinse route 2 (two) times daily. For dry mouth   ANUSOL-HC RE Place 1 application  rectally as needed.   aspirin 81 MG chewable tablet Chew by mouth daily.   calcium carbonate 500 MG chewable tablet Commonly known as: Tums Chew 1-2 tablets (200-400 mg of elemental calcium total) by mouth 3 (three) times daily as needed for indigestion or heartburn.   clotrimazole 1 % vaginal cream Commonly known as: GYNE-LOTRIMIN Place 1 Applicatorful vaginally every morning.   clotrimazole 1 % vaginal cream Commonly known as: GYNE-LOTRIMIN Place 1 Applicatorful vaginally 3 (three) times a week. Monday,Wednesday, and Friday mornings.   EYE ITCH RELIEF OP Place 1 drop into both eyes every 12 (twelve) hours as needed.   fluconazole 150 MG tablet Commonly known as: DIFLUCAN Take 150 mg by mouth once a week. Tuesday for redness on vaginal area.   fluconazole 100 MG tablet Commonly known as: DIFLUCAN Take 1 tablet (100 mg total) by mouth 2 times daily at 12 noon and 4 pm for 14 days.   fluticasone 50 MCG/ACT nasal spray Commonly known as: FLONASE Place 1 spray into both nostrils as needed for allergies or rhinitis.   gabapentin 100 MG capsule Commonly known as: NEURONTIN Take 200 mg by mouth 2 (two) times daily.   glipiZIDE 10 MG 24 hr tablet Commonly known as: GLUCOTROL XL Take 1 tablet (10 mg total) by mouth daily with breakfast.   lisinopril 2.5 MG tablet Commonly known as: ZESTRIL Take 2.5 mg by mouth in the morning.   loperamide 2 MG tablet Commonly known as: IMODIUM A-D Take 2 mg by mouth every 12  (twelve) hours as needed for diarrhea or loose stools.   loratadine 10 MG tablet Commonly known as: CLARITIN Take 10 mg by mouth every morning.   Melatonin 5 MG Caps Take 1 capsule by mouth daily. As needed for insomnia   metFORMIN 500 MG tablet Commonly known as: GLUCOPHAGE Take 250 mg by mouth every evening.   metFORMIN 500 MG tablet Commonly known as: GLUCOPHAGE Take 500 mg by mouth every morning.   metoprolol succinate 100 MG 24 hr tablet Commonly known as: TOPROL-XL Take 100 mg by mouth 2 (two) times daily. Take with or immediately following a meal.   pravastatin 20 MG tablet Commonly known as: PRAVACHOL Take 20 mg by mouth every evening.  sertraline 50 MG tablet Commonly known as: Zoloft Take 1 tablet (50 mg total) by mouth daily.   sitaGLIPtin 50 MG tablet Commonly known as: Januvia Take 1 tablet (50 mg total) by mouth daily.   zinc oxide 20 % ointment Apply 1 application  topically as needed (To buttocks after every incontinent epsiode and as needed for redness).        Review of Systems  Constitutional: Negative.   HENT: Negative.    Respiratory: Negative.    Gastrointestinal:  Negative for abdominal distention and abdominal pain.  Endocrine: Negative for polydipsia, polyphagia and polyuria.  Genitourinary:  Positive for vaginal discharge and vaginal pain.  Psychiatric/Behavioral:  Positive for confusion and dysphoric mood. The patient is not nervous/anxious.     Immunization History  Administered Date(s) Administered   Fluad Quad(high Dose 65+) 05/03/2022   Influenza Split 03/19/2012, 03/19/2013   Influenza, High Dose Seasonal PF 04/09/2014, 03/22/2015, 03/24/2016, 03/29/2018, 04/16/2020, 05/09/2023   Influenza,inj,Quad PF,6+ Mos 04/09/2014, 03/22/2015, 03/24/2016   Influenza-Unspecified 03/19/2012, 03/19/2013, 04/09/2017, 03/24/2018, 04/16/2020   Moderna Covid-19 Vaccine Bivalent Booster 21yrs & up 05/16/2023   Moderna SARS-COV2 Booster  Vaccination 05/16/2022   Moderna Sars-Covid-2 Vaccination 05/19/2021   PFIZER(Purple Top)SARS-COV-2 Vaccination 08/17/2019, 09/10/2019, 04/06/2020, 11/19/2020   Pfizer Covid-19 Vaccine Bivalent Booster 90yrs & up 11/18/2020, 12/21/2021   Pneumococcal Conjugate-13 11/05/2014   Pneumococcal Polysaccharide-23 07/10/2004, 12/12/2010, 05/02/2016   Pneumococcal-Unspecified 07/10/2004, 12/12/2010, 05/02/2016   Respiratory Syncytial Virus Vaccine,Recomb Aduvanted(Arexvy) 06/24/2022   Td 07/10/2009   Tdap 07/06/2022   Zoster Recombinant(Shingrix) 04/09/2017, 07/19/2017   Zoster, Live 07/10/2006   Pertinent  Health Maintenance Due  Topic Date Due   HEMOGLOBIN A1C  12/11/2023   OPHTHALMOLOGY EXAM  04/15/2024   FOOT EXAM  05/13/2024   INFLUENZA VACCINE  Completed   DEXA SCAN  Completed      02/02/2023    2:47 PM 03/14/2023    3:13 PM 04/18/2023    2:05 PM 05/14/2023   12:34 PM 07/19/2023   10:42 AM  Fall Risk  Falls in the past year? 0 0 0 0 0  Was there an injury with Fall? 0 0 0 0 0  Fall Risk Category Calculator 0 0 0 0 0  Patient at Risk for Falls Due to History of fall(s);Impaired balance/gait;Impaired mobility History of fall(s);Impaired balance/gait History of fall(s);Impaired balance/gait;Impaired mobility History of fall(s);Impaired balance/gait;Impaired mobility History of fall(s);Impaired balance/gait;Impaired mobility  Fall risk Follow up Falls evaluation completed;Education provided;Falls prevention discussed Falls evaluation completed;Education provided  Falls evaluation completed;Education provided;Falls prevention discussed Falls evaluation completed;Education provided;Falls prevention discussed   Functional Status Survey:    Vitals:   08/13/23 1002  BP: 126/71  Pulse: 73  Resp: 16  Temp: (!) 97.2 F (36.2 C)  SpO2: 95%  Weight: 168 lb (76.2 kg)  Height: 5' (1.524 m)   Body mass index is 32.81 kg/m. Physical Exam Vitals reviewed. Exam conducted with a chaperone  present.  Constitutional:      General: She is not in acute distress. HENT:     Head: Normocephalic.  Eyes:     General:        Right eye: No discharge.        Left eye: No discharge.  Cardiovascular:     Rate and Rhythm: Normal rate and regular rhythm.     Pulses: Normal pulses.     Heart sounds: Normal heart sounds.  Pulmonary:     Effort: Pulmonary effort is normal.     Breath sounds: Normal  breath sounds.  Abdominal:     General: Bowel sounds are normal.     Palpations: Abdomen is soft.  Genitourinary:    General: Normal vulva.     Vagina: Vaginal discharge, erythema and tenderness present. No bleeding or lesions.  Musculoskeletal:     Cervical back: Neck supple.     Right lower leg: No edema.     Left lower leg: No edema.  Skin:    General: Skin is warm.     Capillary Refill: Capillary refill takes less than 2 seconds.  Neurological:     General: No focal deficit present.     Mental Status: She is alert and oriented to person, place, and time.     Motor: Weakness present.     Gait: Gait abnormal.  Psychiatric:        Mood and Affect: Mood normal.     Labs reviewed: Recent Labs    11/23/22 0000 04/30/23 0000 05/03/23 0000  NA 139 137 138  K 4.5 4.1 4.5  CL 107 108 110*  CO2 22 17 16   BUN 21 32* 40*  CREATININE 1.3* 1.6* 1.4*  CALCIUM 8.8 8.5* 8.7   No results for input(s): "AST", "ALT", "ALKPHOS", "BILITOT", "PROT", "ALBUMIN" in the last 8760 hours. Recent Labs    11/23/22 0000 04/30/23 0000 05/03/23 0000  WBC 9.2 14.4 11.8  NEUTROABS 6,017.00 11,160.00 8,803.00  HGB 12.0 11.2* 11.4*  HCT 36 34* 35*  PLT 323 328 402*   Lab Results  Component Value Date   TSH 3.100 08/12/2019   Lab Results  Component Value Date   HGBA1C 7.4 06/12/2023   Lab Results  Component Value Date   CHOL 113 06/12/2023   HDL 38 06/12/2023   LDLCALC 53 06/12/2023   TRIG 135 06/12/2023   CHOLHDL 4.4 12/19/2018    Significant Diagnostic Results in last 30 days:   No results found.  Assessment/Plan 1. Vaginal candidiasis (Primary) - ongoing - 01/24 fluconazole 100 mg BID x 14 days started> done 02/06 - symptoms improved some - continues to have pain, white discharge and erythema - will increase clotrimazole 1% cream to BID - start Diflucan 150 mg po- Give 2x/weekly  2. Painful micturition - see above - unsuccessful trial Estrace  3. Type 2 diabetes mellitus with hyperglycemia, without long-term current use of insulin (HCC) - A1c 7.4 -Continue metformin, glipizide, and Januvia -Diabetic Eye exam on (04/16/2023) -Urine microalbumin done on (03/15/2023)    Family/ staff Communication: plan discussed with patient and nurse  Labs/tests ordered:  none

## 2023-08-22 ENCOUNTER — Encounter: Payer: Self-pay | Admitting: Orthopedic Surgery

## 2023-08-22 ENCOUNTER — Non-Acute Institutional Stay (SKILLED_NURSING_FACILITY): Payer: Self-pay | Admitting: Orthopedic Surgery

## 2023-08-22 DIAGNOSIS — R102 Pelvic and perineal pain: Secondary | ICD-10-CM | POA: Diagnosis not present

## 2023-08-22 DIAGNOSIS — R309 Painful micturition, unspecified: Secondary | ICD-10-CM

## 2023-08-22 MED ORDER — DOXYCYCLINE HYCLATE 100 MG PO TABS: 100.0000 mg | ORAL_TABLET | Freq: Two times a day (BID) | ORAL | Status: AC

## 2023-08-22 NOTE — Progress Notes (Signed)
Location:  [facility] Room Number: 26/A Place of Service:  SNF [phone]) Provider:  Octavia Heir, NP   [name], MD  Patient Care Team: [name], MD as PCP - General (Internal Medicine) Ernesto Rutherford, MD (Ophthalmology) Claud Kelp, MD as Consulting Physician (General Surgery) Carlus Pavlov, MD as Consulting Physician (Internal Medicine)  Extended Emergency Contact Information Primary Emergency Contact: [name],[name] Address: [address]          [place] [address] [place] Home Phone: [phone] Mobile Phone: [phone] Relation: Spouse  Code Status:  DNR Goals of care: Advanced Directive information    08/13/2023   10:29 AM  Advanced Directives  Does Patient Have a Medical Advance Directive? Yes  Type of Estate agent of [place];Living will;Out of facility DNR (pink MOST or yellow form)  Does patient want to make changes to medical advance directive? No - Patient declined  Copy of Healthcare Power of Attorney in Chart? No - copy requested     Chief Complaint  Patient presents with   Acute Visit    Vaginal pain    HPI:  Pt is a 83 y.o. female seen today for acute visit due to ongoing vaginal pain.   She currently resides on the skilled nursing unit at [facility]. PMH: HTN, HLD, GERD, IBS, T2DM, polyneuropathy, DJD, osteopenia, Factor 5 mutation, breast cancer s/p  left lumpectomy 1996, incontinence, and anxiety.   H/o painful micturition> 1 year. She has been evaluated by urology in past. Urine cultures grew yeast. 04/02/2022 CT abdomen unremarkable. She was advised to use clotrimazole 1% cream BID prn. Invokana was discontinued. Unsuccessful trial Estradiol. Diflucan 150 mg once weekly was started. She had done well for awhile. C/o intermittent dysuria at times. She did receive a few rounds fluconazole 100 mg BID x 7 days and symptoms improved. 01/24 she c/o worsening dysuria. Pain  described " having sex with metal cheese grater." She had not been asking for clotrimazole cream> 1 month. White discharge was noted on exam. She was started on fluconazole 100 mg BID x 10 days and clotrimazole cream was scheduled. 02/03 symptoms improved but she continued to have pain. Clotrimazole cream BID was extended. Diflucan 150 mg increased to 2x/weekly. Today, pain has not improved. She continues to have increased pain when urinating. Denies hematuira or vaginal discharge. Afebrile. Vitals stable.    Past Medical History:  Diagnosis Date   Allergy    [name]; allergy shots weekly.     Anxiety    Arthritis    DDD lumbar spine.     Cancer (HCC)    breast L x 2; skin basal cell carcinoma [name]).   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   Past Surgical History:  Procedure Laterality Date   BREAST LUMPECTOMY  1996   L breast cancer   BREAST SURGERY N/A    [employer] 12/12/2019   EYE SURGERY N/A    [employer] 12/12/2019   MASTECTOMY  2002   Bilateral for breast cancer   VAGINAL HYSTERECTOMY  1985   DUB; uterine fibroids; ovaries intact.    Allergies  Allergen Reactions   Fruit & Vegetable Daily [Nutritional Supplements] Diarrhea   Mixed Ragweed  Onion Other (See Comments)    indigestion   Tree Extract Swelling   Sulfa Drugs Cross Reactors Rash    All over the body    Outpatient Encounter Medications as of 08/22/2023  Medication Sig   acetaminophen (TYLENOL) 500 MG tablet Take 1,000 mg by mouth every 12 (twelve) hours as needed.   acetaminophen (TYLENOL) 500 MG tablet Take 1,000 mg by mouth every evening.   albuterol (VENTOLIN HFA) 108 (90 Base) MCG/ACT inhaler Inhale 2 puffs into the lungs every 6 (six) hours as needed for wheezing or shortness of breath.    antiseptic oral rinse (BIOTENE) LIQD 30 mLs by Mouth Rinse route 2 (two) times daily. For dry mouth   aspirin 81 MG chewable tablet Chew by mouth daily.   calcium carbonate (TUMS) 500 MG chewable tablet Chew 1-2 tablets (200-400 mg of elemental calcium total) by mouth 3 (three) times daily as needed for indigestion or heartburn.   fluconazole (DIFLUCAN) 150 MG tablet Take 150 mg by mouth 2 (two) times a week. Tuesday for redness on vaginal area.   fluticasone (FLONASE) 50 MCG/ACT nasal spray Place 1 spray into both nostrils as needed for allergies or rhinitis.   gabapentin (NEURONTIN) 100 MG capsule Take 200 mg by mouth 2 (two) times daily.   glipiZIDE (GLUCOTROL XL) 10 MG 24 hr tablet Take 1 tablet (10 mg total) by mouth daily with breakfast.   Hydrocortisone Acetate (ANUSOL-HC RE) Place 1 application  rectally as needed.   Ketotifen Fumarate (EYE ITCH RELIEF OP) Place 1 drop into both eyes every 12 (twelve) hours as needed.   lisinopril (ZESTRIL) 2.5 MG tablet Take 2.5 mg by mouth in the morning.   loperamide (IMODIUM A-D) 2 MG tablet Take 2 mg by mouth every 12 (twelve) hours as needed for diarrhea or loose stools.   loratadine (CLARITIN) 10 MG tablet Take 10 mg by mouth every morning.   Melatonin 5 MG CAPS Take 1 capsule by mouth daily. As needed for insomnia   metFORMIN (GLUCOPHAGE) 500 MG tablet Take 250 mg by mouth every evening.   metFORMIN (GLUCOPHAGE) 500 MG tablet Take 500 mg by mouth every morning.   metoprolol succinate (TOPROL-XL) 100 MG 24 hr tablet Take 100 mg by mouth 2 (two) times daily. Take with or immediately following a meal.   pravastatin (PRAVACHOL) 20 MG tablet Take 20 mg by mouth every evening.   sertraline (ZOLOFT) 50 MG tablet Take 1 tablet (50 mg total) by mouth daily.   sitaGLIPtin (JANUVIA) 50 MG tablet Take 1 tablet (50 mg total) by mouth daily.   zinc oxide 20 % ointment Apply 1 application  topically as needed (To buttocks after every incontinent epsiode and as  needed for redness).   No facility-administered encounter medications on file as of 08/22/2023.    Review of Systems  Constitutional:  Negative for activity change and appetite change.  Respiratory:  Negative for shortness of breath.   Cardiovascular:  Negative for chest pain.  Genitourinary:  Positive for dysuria and vaginal pain. Negative for frequency, hematuria and vaginal discharge.  Psychiatric/Behavioral:  Negative for dysphoric mood. The patient is not nervous/anxious.     Immunization History  Administered Date(s) Administered   Fluad Quad(high Dose 65+) 05/03/2022   Influenza Split 03/19/2012, 03/19/2013   Influenza, High Dose Seasonal PF 04/09/2014, 03/22/2015, 03/24/2016, 03/29/2018, 04/16/2020, 05/09/2023   Influenza,inj,Quad PF,6+ Mos 04/09/2014, 03/22/2015, 03/24/2016   Influenza-Unspecified 03/19/2012, 03/19/2013, 04/09/2017, 03/24/2018, 04/16/2020   Moderna Covid-19 Vaccine Bivalent Booster 82yrs &  up 05/16/2023   Moderna SARS-COV2 Booster Vaccination 05/16/2022   Moderna Sars-Covid-2 Vaccination 05/19/2021   PFIZER(Purple Top)SARS-COV-2 Vaccination 08/17/2019, 09/10/2019, 04/06/2020, 11/19/2020   Pfizer Covid-19 Vaccine Bivalent Booster 70yrs & up 11/18/2020, 12/21/2021   Pneumococcal Conjugate-13 11/05/2014   Pneumococcal Polysaccharide-23 07/10/2004, 12/12/2010, 05/02/2016   Pneumococcal-Unspecified 07/10/2004, 12/12/2010, 05/02/2016   Respiratory Syncytial Virus Vaccine,Recomb Aduvanted(Arexvy) 06/24/2022   Td 07/10/2009   Tdap 07/06/2022   Zoster Recombinant(Shingrix) 04/09/2017, 07/19/2017   Zoster, Live 07/10/2006   Pertinent  Health Maintenance Due  Topic Date Due   HEMOGLOBIN A1C  12/11/2023   OPHTHALMOLOGY EXAM  04/15/2024   FOOT EXAM  05/13/2024   INFLUENZA VACCINE  Completed   DEXA SCAN  Completed      02/02/2023    2:47 PM 03/14/2023    3:13 PM 04/18/2023    2:05 PM 05/14/2023   12:34 PM 07/19/2023   10:42 AM  Fall Risk  Falls in the past year?  0 0 0 0 0  Was there an injury with Fall? 0 0 0 0 0  Fall Risk Category Calculator 0 0 0 0 0  Patient at Risk for Falls Due to History of fall(s);Impaired balance/gait;Impaired mobility History of fall(s);Impaired balance/gait History of fall(s);Impaired balance/gait;Impaired mobility History of fall(s);Impaired balance/gait;Impaired mobility History of fall(s);Impaired balance/gait;Impaired mobility  Fall risk Follow up Falls evaluation completed;Education provided;Falls prevention discussed Falls evaluation completed;Education provided  Falls evaluation completed;Education provided;Falls prevention discussed Falls evaluation completed;Education provided;Falls prevention discussed   Functional Status Survey:    Vitals:   08/22/23 1427  BP: (!) 146/74  Pulse: 72  Resp: 17  Temp: (!) 97.5 F (36.4 C)  SpO2: 99%  Weight: 168 lb (76.2 kg)  Height: 5' (1.524 m)   Body mass index is 32.81 kg/m. Physical Exam Vitals reviewed. Exam conducted with a chaperone present.  Constitutional:      General: She is not in acute distress. HENT:     Head: Normocephalic.  Eyes:     General:        Right eye: No discharge.        Left eye: No discharge.  Cardiovascular:     Rate and Rhythm: Normal rate and regular rhythm.     Pulses: Normal pulses.     Heart sounds: Normal heart sounds.  Pulmonary:     Effort: Pulmonary effort is normal.     Breath sounds: Normal breath sounds.  Abdominal:     General: Bowel sounds are normal.     Palpations: Abdomen is soft.  Genitourinary:    Vagina: Erythema and tenderness present. No vaginal discharge, bleeding or lesions.     Comments: Vulvovaginal with erythema and mild tenderness, skin appears excoriated, no discharge or odor.  Musculoskeletal:     Cervical back: Neck supple.  Neurological:     Mental Status: She is alert.     Labs reviewed: Recent Labs    11/23/22 0000 04/30/23 0000 05/03/23 0000  NA 139 137 138  K 4.5 4.1 4.5  CL 107  108 110*  CO2 22 17 16   BUN 21 32* 40*  CREATININE 1.3* 1.6* 1.4*  CALCIUM 8.8 8.5* 8.7   No results for input(s): "AST", "ALT", "ALKPHOS", "BILITOT", "PROT", "ALBUMIN" in the last 8760 hours. Recent Labs    11/23/22 0000 04/30/23 0000 05/03/23 0000  WBC 9.2 14.4 11.8  NEUTROABS 6,017.00 11,160.00 8,803.00  HGB 12.0 11.2* 11.4*  HCT 36 34* 35*  PLT 323 328 402*   Lab Results  Component  Value Date   TSH 3.100 08/12/2019   Lab Results  Component Value Date   HGBA1C 7.4 06/12/2023   Lab Results  Component Value Date   CHOL 113 06/12/2023   HDL 38 06/12/2023   LDLCALC 53 06/12/2023   TRIG 135 06/12/2023   CHOLHDL 4.4 12/19/2018    Significant Diagnostic Results in last 30 days:  No results found.  Assessment/Plan 1. Vaginal pain (Primary) - ongoing - h/o painful micturition - increased redness and tenderness to vulvovaginal> no discharge - completed fluconazole and Clotrimazole 1% cream - suspect fungal infection has resolved - ? Bacterial infection - vaginal wound culture - start doxycycline for infection - gynecology consult - doxycycline (VIBRA-TABS) 100 MG tablet; Take 1 tablet (100 mg total) by mouth 2 (two) times daily for 10 days.  2. Painful micturition - see above - past urology evaluation noted candiduria - unsuccessful trial Estradiol    Family/ staff Communication: plan discussed with patient and nurse  Labs/tests ordered:  vaginal wound culture

## 2023-08-27 ENCOUNTER — Encounter: Payer: Self-pay | Admitting: Nurse Practitioner

## 2023-08-27 NOTE — Progress Notes (Signed)
 This encounter was created in error - please disregard.

## 2023-08-30 ENCOUNTER — Non-Acute Institutional Stay (SKILLED_NURSING_FACILITY): Payer: Self-pay | Admitting: Internal Medicine

## 2023-08-30 DIAGNOSIS — E785 Hyperlipidemia, unspecified: Secondary | ICD-10-CM

## 2023-08-30 DIAGNOSIS — E1169 Type 2 diabetes mellitus with other specified complication: Secondary | ICD-10-CM

## 2023-08-30 DIAGNOSIS — G629 Polyneuropathy, unspecified: Secondary | ICD-10-CM

## 2023-08-30 DIAGNOSIS — F03A18 Unspecified dementia, mild, with other behavioral disturbance: Secondary | ICD-10-CM | POA: Diagnosis not present

## 2023-08-30 DIAGNOSIS — N1832 Chronic kidney disease, stage 3b: Secondary | ICD-10-CM

## 2023-08-30 DIAGNOSIS — F339 Major depressive disorder, recurrent, unspecified: Secondary | ICD-10-CM

## 2023-08-30 DIAGNOSIS — E1165 Type 2 diabetes mellitus with hyperglycemia: Secondary | ICD-10-CM | POA: Diagnosis not present

## 2023-08-30 DIAGNOSIS — N761 Subacute and chronic vaginitis: Secondary | ICD-10-CM | POA: Diagnosis not present

## 2023-08-30 DIAGNOSIS — I10 Essential (primary) hypertension: Secondary | ICD-10-CM | POA: Diagnosis not present

## 2023-08-31 NOTE — Progress Notes (Signed)
 [facility]  [facility]  SNF (31)  Provider:   Code Status: DNR Goals of Care:     08/13/2023   10:29 AM  Advanced Directives  Does Patient Have a Medical Advance Directive? Yes  Type of Estate agent of [place];Living will;Out of facility DNR (pink MOST or yellow form)  Does patient want to make changes to medical advance directive? No - Patient declined  Copy of Healthcare Power of Attorney in Chart? No - copy requested     Chief Complaint  Patient presents with   Acute Visit    HPI: Patient is a 83 y.o. female seen today for medical management of chronic diseases.     L[facility] in SNF [facility]   Patient has a history of type 2 diabetes History of neurodegenerative dementia Seen Neurology in 03/21 MMSE 25/30 MRI Moderate atrophy. Mild progression of white matter changes most likely due to chronic microvascular ischemia No workup since then Urinary Incontinence Insomnia, And Neuropathy Chronic Candidiasis CT abdomen/pelvis negative for hydronephrosis, renal calculi, neoplasm   Her Acute issues  Chronic Vaginal rash and pain Recent worsening Was treated with Diflucan for 2 weeks Also got Doxycyline Has GYN Appointment in 2 weeks  But patient continues to have a lot of pain in that area specially when she urinates.  Want something to help with the pain .  Diabetes CBGS are running more then 200 She is on Reduced doses of Januvia and Metformin due to her CKD Constipation Refuses Senna and Miralax and now having Constipation  Gets up with One assist Can take herself to [facility] Last 3 Encounters:  08/30/23 168 lb (76.2 kg)  08/22/23 168 lb (76.2 kg)  08/13/23 168 lb (76.2 kg)     Past Medical History:  Diagnosis Date   Allergy    [name]; allergy shots weekly.     Anxiety    Arthritis    DDD lumbar spine.     Cancer (HCC)    breast L x 2; skin basal cell carcinoma [name]).   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    Past Surgical History:  Procedure Laterality Date   BREAST LUMPECTOMY  1996   L breast cancer   BREAST SURGERY N/A    Phreesia 12/12/2019   EYE SURGERY N/A    Phreesia 12/12/2019   MASTECTOMY  2002   Bilateral for breast cancer   VAGINAL HYSTERECTOMY  1985   DUB; uterine fibroids; ovaries intact.    Allergies  Allergen Reactions   Fruit & Vegetable Daily [Nutritional Supplements] Diarrhea   Mixed Ragweed    Onion Other (See Comments)    indigestion   Tree Extract Swelling   Sulfa Drugs Cross Reactors Rash    All over the body    Outpatient Encounter Medications as of 08/30/2023  Medication Sig   acetaminophen (TYLENOL) 500 MG tablet Take 1,000 mg by mouth every 12 (twelve) hours as needed.   acetaminophen (TYLENOL) 500 MG tablet Take 1,000 mg by mouth every evening.   albuterol (VENTOLIN HFA) 108 (90 Base) MCG/ACT inhaler Inhale 2 puffs into the lungs every 6 (six) hours as needed for wheezing or shortness of breath.   antiseptic oral  rinse (BIOTENE) LIQD 30 mLs by Mouth Rinse route 2 (two) times daily. For dry mouth   aspirin 81 MG chewable tablet Chew by mouth daily.   calcium carbonate (TUMS) 500 MG chewable tablet Chew 1-2 tablets (200-400 mg of elemental calcium total) by mouth 3 (three) times daily as needed for indigestion or heartburn.   [EXPIRED] doxycycline (VIBRA-TABS) 100 MG tablet Take 1 tablet (100 mg total) by mouth 2 (two) times daily for 10 days.   fluconazole (DIFLUCAN) 150 MG tablet Take 150 mg by mouth 2 (two) times a week. Tuesday for redness on vaginal area.   fluticasone (FLONASE) 50 MCG/ACT nasal spray Place 1 spray into both nostrils as needed for allergies or rhinitis.   gabapentin (NEURONTIN) 100 MG capsule Take 200 mg  by mouth 2 (two) times daily.   glipiZIDE (GLUCOTROL XL) 10 MG 24 hr tablet Take 1 tablet (10 mg total) by mouth daily with breakfast.   Hydrocortisone Acetate (ANUSOL-HC RE) Place 1 application  rectally as needed.   Ketotifen Fumarate (EYE ITCH RELIEF OP) Place 1 drop into both eyes every 12 (twelve) hours as needed.   lisinopril (ZESTRIL) 2.5 MG tablet Take 2.5 mg by mouth in the morning.   loperamide (IMODIUM A-D) 2 MG tablet Take 2 mg by mouth every 12 (twelve) hours as needed for diarrhea or loose stools.   loratadine (CLARITIN) 10 MG tablet Take 10 mg by mouth every morning.   Melatonin 5 MG CAPS Take 1 capsule by mouth daily. As needed for insomnia   metFORMIN (GLUCOPHAGE) 500 MG tablet Take 250 mg by mouth every evening.   metFORMIN (GLUCOPHAGE) 500 MG tablet Take 500 mg by mouth every morning.   metoprolol succinate (TOPROL-XL) 100 MG 24 hr tablet Take 100 mg by mouth 2 (two) times daily. Take with or immediately following a meal.   pravastatin (PRAVACHOL) 20 MG tablet Take 20 mg by mouth every evening.   sertraline (ZOLOFT) 50 MG tablet Take 1 tablet (50 mg total) by mouth daily.   sitaGLIPtin (JANUVIA) 50 MG tablet Take 1 tablet (50 mg total) by mouth daily.   zinc oxide 20 % ointment Apply 1 application  topically as needed (To buttocks after every incontinent epsiode and as needed for redness).   No facility-administered encounter medications on file as of 08/30/2023.    Review of Systems:  Review of Systems  Constitutional:  Negative for activity change and appetite change.  HENT: Negative.    Respiratory:  Negative for cough and shortness of breath.   Cardiovascular:  Negative for leg swelling.  Gastrointestinal:  Negative for constipation.  Genitourinary: Negative.   Musculoskeletal:  Negative for arthralgias, gait problem and myalgias.  Skin: Negative.   Neurological:  Negative for dizziness and weakness.  Psychiatric/Behavioral:  Negative for confusion, dysphoric mood  and sleep disturbance.     Health Maintenance  Topic Date Due   COVID-19 Vaccine (9 - 2024-25 season) 07/11/2023   HEMOGLOBIN A1C  12/11/2023   Medicare Annual Wellness (AWV)  02/02/2024   Diabetic kidney evaluation - Urine ACR  03/21/2024   OPHTHALMOLOGY EXAM  04/15/2024   Diabetic kidney evaluation - eGFR measurement  05/02/2024   FOOT EXAM  05/13/2024   DTaP/Tdap/Td (3 - Td or Tdap) 07/06/2032   Pneumonia Vaccine 36+ Years old  Completed   INFLUENZA VACCINE  Completed   DEXA SCAN  Completed   Zoster Vaccines- Shingrix  Completed   HPV VACCINES  Aged Out    Physical Exam:  Vitals:   08/30/23 1630  BP: (!) 143/70  Pulse: 74  Resp: 17  Temp: 97.8 F (36.6 C)  Weight: 168 lb (76.2 kg)   Body mass index is 32.81 kg/m. Physical Exam Vitals reviewed.  Constitutional:      Appearance: Normal appearance.  HENT:     Head: Normocephalic.     Nose: Nose normal.     Mouth/Throat:     Mouth: Mucous membranes are moist.     Pharynx: Oropharynx is clear.  Eyes:     Pupils: Pupils are equal, round, and reactive to light.  Cardiovascular:     Rate and Rhythm: Normal rate and regular rhythm.     Pulses: Normal pulses.     Heart sounds: Normal heart sounds. No murmur heard. Pulmonary:     Effort: Pulmonary effort is normal.     Breath sounds: Normal breath sounds.  Abdominal:     General: Abdomen is flat. Bowel sounds are normal.     Palpations: Abdomen is soft.  Genitourinary:    Comments: Redness in vulvar area and Inside the Labial area No discharge no Odor No Ulcers Tender in that area Musculoskeletal:        General: No swelling.     Cervical back: Neck supple.  Skin:    General: Skin is warm.  Neurological:     General: No focal deficit present.     Mental Status: She is alert.  Psychiatric:        Mood and Affect: Mood normal.        Thought Content: Thought content normal.     Labs reviewed: Basic Metabolic Panel: Recent Labs    11/23/22 0000  04/30/23 0000 05/03/23 0000  NA 139 137 138  K 4.5 4.1 4.5  CL 107 108 110*  CO2 22 17 16   BUN 21 32* 40*  CREATININE 1.3* 1.6* 1.4*  CALCIUM 8.8 8.5* 8.7   Liver Function Tests: No results for input(s): "AST", "ALT", "ALKPHOS", "BILITOT", "PROT", "ALBUMIN" in the last 8760 hours. No results for input(s): "LIPASE", "AMYLASE" in the last 8760 hours. No results for input(s): "AMMONIA" in the last 8760 hours. CBC: Recent Labs    11/23/22 0000 04/30/23 0000 05/03/23 0000  WBC 9.2 14.4 11.8  NEUTROABS 6,017.00 11,160.00 8,803.00  HGB 12.0 11.2* 11.4*  HCT 36 34* 35*  PLT 323 328 402*   Lipid Panel: Recent Labs    06/12/23 0000  CHOL 113  HDL 38  LDLCALC 53  TRIG 135   Lab Results  Component Value Date   HGBA1C 7.4 06/12/2023    Procedures since last visit: No results found.  Assessment/Plan 1. Subacute vaginitis (Primary) Patient was treated with Diflucan and Doxycyline Has improved but continues with Discomfort and redness Will start on Mometasone BID for 1 week to see if it improves her symptoms She does have appointment ot see Gynecologist  2. Type 2 diabetes mellitus with hyperglycemia, without long-term current use of insulin (HCC) CBGS running High Will Check CBG every day for 1 week Consider starting Insulin  3. Mild dementia with other behavioral disturbance, unspecified dementia type [facility]) Doing well in SNF  4. Essential hypertension Lisinopril and Metoprolol  5. Hyperlipidemia associated with type 2 diabetes mellitus (HCC) Statin LDL 53 in 12/24  6. Recurrent depression (HCC) Zoloft 7 Constipation Discussed again that she needs Something to help her move her bowels Agreed for Senna Plus right now 8 Neuropathy On Neurontin 9  CKD Stage 3 b  Creat stable   Labs/tests ordered:   Next appt:  Visit date not found

## 2023-09-02 ENCOUNTER — Encounter: Payer: Self-pay | Admitting: Internal Medicine

## 2023-09-03 ENCOUNTER — Ambulatory Visit: Payer: Medicare PPO | Admitting: Diagnostic Neuroimaging

## 2023-09-06 ENCOUNTER — Non-Acute Institutional Stay (SKILLED_NURSING_FACILITY): Payer: Self-pay | Admitting: Internal Medicine

## 2023-09-06 DIAGNOSIS — E1165 Type 2 diabetes mellitus with hyperglycemia: Secondary | ICD-10-CM | POA: Diagnosis not present

## 2023-09-06 DIAGNOSIS — N1832 Chronic kidney disease, stage 3b: Secondary | ICD-10-CM | POA: Diagnosis not present

## 2023-09-07 ENCOUNTER — Encounter: Payer: Self-pay | Admitting: Internal Medicine

## 2023-09-07 ENCOUNTER — Non-Acute Institutional Stay (SKILLED_NURSING_FACILITY): Payer: Self-pay | Admitting: Orthopedic Surgery

## 2023-09-07 ENCOUNTER — Encounter: Payer: Self-pay | Admitting: Orthopedic Surgery

## 2023-09-07 DIAGNOSIS — N761 Subacute and chronic vaginitis: Secondary | ICD-10-CM | POA: Diagnosis not present

## 2023-09-07 MED ORDER — PREGABALIN 75 MG PO CAPS
75.0000 mg | ORAL_CAPSULE | Freq: Two times a day (BID) | ORAL | 5 refills | Status: DC
Start: 2023-09-07 — End: 2023-11-05

## 2023-09-07 NOTE — Progress Notes (Signed)
 [facility]  [facility] (31)  Provider:   Code Status: DNR Goals of Care:     08/13/2023   10:29 AM  Advanced Directives  Does Patient Have a Medical Advance Directive? Yes  Type of Estate agent of [place];Living will;Out of facility DNR (pink MOST or yellow form)  Does patient want to make changes to medical advance directive? No - Patient declined  Copy of Healthcare Power of Attorney in Chart? No - copy requested     Chief Complaint  Patient presents with   Acute Visit    HPI: Patient is a 83 y.o. female seen today for High Blood Sugars   Lives in SNF in [facility]   Patient has a history of type 2 diabetes CBGS are running more then 200 She is on Reduced doses of Januvia and Metformin due to her CKD She is also on Glipizide Cannot do Jardiance due to her Vaginal Rash and Recurent Fungal rash Discussed with patient and her husband who is agreeable  Other history Chronic Vaginal rash and pain Recent worsening Was treated with Diflucan for 2 weeks Also got Doxycyline Has GYN Appointment in 2 weeks  On Steroid Ointment right now    History of neurodegenerative dementia Seen Neurology in 03/21 MMSE 25/30 MRI Moderate atrophy. Mild progression of white matter changes most likely due to chronic microvascular ischemia No workup since then Urinary Incontinence Insomnia, And Neuropathy Chronic Candidiasis CT abdomen/pelvis negative for hydronephrosis, renal calculi, neoplasm   Past Medical History:  Diagnosis Date   Allergy    [name]; allergy shots weekly.     Anxiety    Arthritis    DDD lumbar spine.     Cancer (HCC)    breast L x 2; skin basal cell carcinoma [name]).   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    Past Surgical History:  Procedure Laterality Date   BREAST LUMPECTOMY  1996   L breast cancer   BREAST SURGERY N/A    Phreesia 12/12/2019   EYE SURGERY N/A    Phreesia 12/12/2019   MASTECTOMY  2002   Bilateral for breast cancer   VAGINAL HYSTERECTOMY  1985   DUB; uterine fibroids; ovaries intact.    Allergies  Allergen Reactions   Fruit & Vegetable Daily [Nutritional Supplements] Diarrhea   Mixed Ragweed    Onion Other (See Comments)    indigestion   Tree Extract Swelling   Sulfa Drugs Cross Reactors Rash    All over the body    Outpatient Encounter Medications as of 09/06/2023  Medication Sig   insulin glargine (LANTUS) 100 UNIT/ML injection Inject 10 Units into the skin every morning.   acetaminophen (TYLENOL) 500 MG tablet Take 1,000 mg by mouth every 12 (twelve) hours as needed.   acetaminophen (TYLENOL) 500 MG tablet Take 1,000 mg by mouth every evening.   albuterol (VENTOLIN HFA) 108 (90 Base) MCG/ACT inhaler Inhale 2 puffs into the lungs every 6 (six) hours as needed for wheezing or shortness of breath.   antiseptic oral rinse (BIOTENE) LIQD 30 mLs by Mouth Rinse route 2 (two) times daily. For dry mouth   aspirin 81 MG chewable tablet Chew by mouth daily.   calcium  carbonate (TUMS) 500 MG chewable tablet Chew 1-2 tablets (200-400 mg of elemental calcium total) by mouth 3 (three) times daily as needed for indigestion or heartburn.   fluconazole (DIFLUCAN) 150 MG tablet Take 150 mg by mouth 2 (two) times a week. Tuesday for redness on vaginal area.   fluticasone (FLONASE) 50 MCG/ACT nasal spray Place 1 spray into both nostrils as needed for allergies or rhinitis.   gabapentin (NEURONTIN) 100 MG capsule Take 200 mg by mouth 2 (two) times daily.   Hydrocortisone Acetate (ANUSOL-HC RE) Place 1 application  rectally as needed.   Ketotifen Fumarate (EYE ITCH RELIEF OP) Place 1 drop into both eyes every 12 (twelve) hours as needed.   lisinopril (ZESTRIL) 2.5 MG  tablet Take 2.5 mg by mouth in the morning.   loperamide (IMODIUM A-D) 2 MG tablet Take 2 mg by mouth every 12 (twelve) hours as needed for diarrhea or loose stools.   loratadine (CLARITIN) 10 MG tablet Take 10 mg by mouth every morning.   Melatonin 5 MG CAPS Take 1 capsule by mouth daily. As needed for insomnia   metFORMIN (GLUCOPHAGE) 500 MG tablet Take 250 mg by mouth every evening.   metFORMIN (GLUCOPHAGE) 500 MG tablet Take 500 mg by mouth every morning.   metoprolol succinate (TOPROL-XL) 100 MG 24 hr tablet Take 100 mg by mouth 2 (two) times daily. Take with or immediately following a meal.   pravastatin (PRAVACHOL) 20 MG tablet Take 20 mg by mouth every evening.   sertraline (ZOLOFT) 50 MG tablet Take 1 tablet (50 mg total) by mouth daily.   sitaGLIPtin (JANUVIA) 50 MG tablet Take 1 tablet (50 mg total) by mouth daily.   zinc oxide 20 % ointment Apply 1 application  topically as needed (To buttocks after every incontinent epsiode and as needed for redness).   [DISCONTINUED] glipiZIDE (GLUCOTROL XL) 10 MG 24 hr tablet Take 1 tablet (10 mg total) by mouth daily with breakfast.   No facility-administered encounter medications on file as of 09/06/2023.    Review of Systems:  Review of Systems  Constitutional:  Negative for activity change and appetite change.  HENT: Negative.    Respiratory:  Negative for cough and shortness of breath.   Cardiovascular:  Negative for leg swelling.  Gastrointestinal:  Negative for constipation.  Genitourinary: Negative.   Musculoskeletal:  Positive for gait problem. Negative for arthralgias and myalgias.  Skin:  Positive for rash.  Neurological:  Negative for dizziness and weakness.  Psychiatric/Behavioral:  Positive for confusion. Negative for dysphoric mood and sleep disturbance.     Health Maintenance  Topic Date Due   COVID-19 Vaccine (9 - 2024-25 season) 07/11/2023   HEMOGLOBIN A1C  12/11/2023   Medicare Annual Wellness (AWV)  02/02/2024    Diabetic kidney evaluation - Urine ACR  03/21/2024   OPHTHALMOLOGY EXAM  04/15/2024   Diabetic kidney evaluation - eGFR measurement  05/02/2024   FOOT EXAM  05/13/2024   DTaP/Tdap/Td (3 - Td or Tdap) 07/06/2032   Pneumonia Vaccine 92+ Years old  Completed   INFLUENZA VACCINE  Completed   DEXA SCAN  Completed   Zoster Vaccines- Shingrix  Completed   HPV VACCINES  Aged Out    Physical Exam: Vitals:   09/07/23 1003  BP: 136/72  Pulse: 74  Temp: (!) 97.4 F (36.3 C)  Weight: 168 lb (76.2 kg)   Body mass index is 32.81 kg/m. Physical Exam Vitals reviewed.  Constitutional:      Appearance: Normal appearance.  HENT:     Head: Normocephalic.     Nose: Nose normal.     Mouth/Throat:     Mouth: Mucous membranes are moist.     Pharynx: Oropharynx is clear.  Eyes:     Pupils: Pupils are equal, round, and reactive to light.  Cardiovascular:     Rate and Rhythm: Normal rate and regular rhythm.     Pulses: Normal pulses.     Heart sounds: Normal heart sounds. No murmur heard. Pulmonary:     Effort: Pulmonary effort is normal.     Breath sounds: Normal breath sounds.  Abdominal:     General: Abdomen is flat. Bowel sounds are normal.     Palpations: Abdomen is soft.  Musculoskeletal:        General: No swelling.     Cervical back: Neck supple.  Skin:    General: Skin is warm.  Neurological:     General: No focal deficit present.     Mental Status: She is alert.  Psychiatric:        Mood and Affect: Mood normal.        Thought Content: Thought content normal.     Labs reviewed: Basic Metabolic Panel: Recent Labs    11/23/22 0000 04/30/23 0000 05/03/23 0000  NA 139 137 138  K 4.5 4.1 4.5  CL 107 108 110*  CO2 22 17 16   BUN 21 32* 40*  CREATININE 1.3* 1.6* 1.4*  CALCIUM 8.8 8.5* 8.7   Liver Function Tests: No results for input(s): "AST", "ALT", "ALKPHOS", "BILITOT", "PROT", "ALBUMIN" in the last 8760 hours. No results for input(s): "LIPASE", "AMYLASE" in the  last 8760 hours. No results for input(s): "AMMONIA" in the last 8760 hours. CBC: Recent Labs    11/23/22 0000 04/30/23 0000 05/03/23 0000  WBC 9.2 14.4 11.8  NEUTROABS 6,017.00 11,160.00 8,803.00  HGB 12.0 11.2* 11.4*  HCT 36 34* 35*  PLT 323 328 402*   Lipid Panel: Recent Labs    06/12/23 0000  CHOL 113  HDL 38  LDLCALC 53  TRIG 135   Lab Results  Component Value Date   HGBA1C 7.4 06/12/2023    Procedures since last visit: No results found.  Assessment/Plan 1. Type 2 diabetes mellitus with hyperglycemia, With CKD stage 3 b Will start her on Lantus 10 units in AM Continue to monitor CBGS BID and Cover with Insulin  Discontinue Glipizide  Husband interested in [place] will also consider that in few weeks  Other issues Subacute vaginitis (Primary) Patient was treated with Diflucan and Doxycyline Has improved but continues with Discomfort and redness Will start on Mometasone BID for 1 week to see if it improves her symptoms She does have appointment ot see Gynecologist     Mild dementia with other behavioral disturbance, unspecified dementia type (HCC) Doing well in SNF   Essential hypertension Lisinopril and Metoprolol    Hyperlipidemia associated with type 2 diabetes mellitus (HCC) Statin LDL 53 in 12/24    Recurrent depression (HCC) Zoloft Constipation Discussed again that she needs Something to help her move her bowels Agreed for Senna Plus right now  CKD Stage 3 b Creat stable  Low dose Lisinopril for Albuminuria   Labs/tests ordered:  * No order type specified * Next appt:  Visit date not found

## 2023-09-07 NOTE — Progress Notes (Signed)
 Location:  [facility] Room Number: 26/A Place of Service:  SNF [phone]) Provider:  Octavia Heir, NP   [name], MD  Patient Care Team: [name], MD as PCP - General (Internal Medicine) Ernesto Rutherford, MD (Ophthalmology) Claud Kelp, MD as Consulting Physician (General Surgery) Carlus Pavlov, MD as Consulting Physician (Internal Medicine)  Extended Emergency Contact Information Primary Emergency Contact: [name],[name] Address: [address]          [place] [address] [place] Home Phone: [phone] Mobile Phone: [phone] Relation: Spouse  Code Status:  DNR Goals of care: Advanced Directive information    08/13/2023   10:29 AM  Advanced Directives  Does Patient Have a Medical Advance Directive? Yes  Type of Estate agent of [place];Living will;Out of facility DNR (pink MOST or yellow form)  Does patient want to make changes to medical advance directive? No - Patient declined  Copy of Healthcare Power of Attorney in Chart? No - copy requested     Chief Complaint  Patient presents with   Acute Visit    Vaginal pain    HPI:  [name] [name] is a 83 y.o. female seen today for acute visit due to ongoing vaginal pain.   She currently resides on the skilled nursing unit at [facility]. PMH: HTN, HLD, GERD, IBS, T2DM, polyneuropathy, DJD, osteopenia, Factor 5 mutation, breast cancer s/p  left lumpectomy 1996, incontinence, and anxiety.   "H/o painful micturition> 1 year. She has been evaluated by urology in past. Urine cultures grew yeast. 04/02/2022 CT abdomen unremarkable. She was advised to use clotrimazole 1% cream BID prn. Invokana was discontinued. Unsuccessful trial Estradiol. Diflucan 150 mg once weekly was started. She had done well for awhile. C/o intermittent dysuria at times. She did receive a few rounds fluconazole 100 mg BID x 7 days and symptoms improved. 01/24 she c/o worsening dysuria. Pain  described " having sex with metal cheese grater." She had not been asking for clotrimazole cream> 1 month. White discharge was noted on exam. She was started on fluconazole 100 mg BID x 10 days and clotrimazole cream was scheduled. 02/03 symptoms improved but she continued to have pain. Clotrimazole cream BID was extended. Diflucan 150 mg increased to 2x/weekly. Today, pain has not improved. She continues to have increased pain when urinating. Denies hematuira or vaginal discharge. Afebrile. Vitals stable."  02/12 she was started on doxycycline due to suspected bacterial infection. Her symptoms did not improve after completing antibiotic. She was then prescribed Mometasone BID for 1 week which she denies relief. She is scheduled to see gynecology soon. Discussed starting Lyrica today for pain relief, she agrees to try.    Past Medical History:  Diagnosis Date   Allergy    [name]; allergy shots weekly.     Anxiety    Arthritis    DDD lumbar spine.     Cancer (HCC)    breast L x 2; skin basal cell carcinoma [name]).   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   Past Surgical History:  Procedure Laterality Date   BREAST LUMPECTOMY  1996   L breast cancer   BREAST SURGERY N/A    Phreesia 12/12/2019   EYE SURGERY N/A  Phreesia 12/12/2019   MASTECTOMY  2002   Bilateral for breast cancer   VAGINAL HYSTERECTOMY  1985   DUB; uterine fibroids; ovaries intact.    Allergies  Allergen Reactions   Fruit & Vegetable Daily [Nutritional Supplements] Diarrhea   Mixed Ragweed    Onion Other (See Comments)    indigestion   Tree Extract Swelling   Sulfa Drugs Cross Reactors Rash    All over the body    Outpatient Encounter Medications as of 09/07/2023  Medication Sig   acetaminophen  (TYLENOL) 500 MG tablet Take 1,000 mg by mouth every 12 (twelve) hours as needed.   acetaminophen (TYLENOL) 500 MG tablet Take 1,000 mg by mouth every evening.   albuterol (VENTOLIN HFA) 108 (90 Base) MCG/ACT inhaler Inhale 2 puffs into the lungs every 6 (six) hours as needed for wheezing or shortness of breath.   antiseptic oral rinse (BIOTENE) LIQD 30 mLs by Mouth Rinse route 2 (two) times daily. For dry mouth   aspirin 81 MG chewable tablet Chew by mouth daily.   calcium carbonate (TUMS) 500 MG chewable tablet Chew 1-2 tablets (200-400 mg of elemental calcium total) by mouth 3 (three) times daily as needed for indigestion or heartburn.   fluconazole (DIFLUCAN) 150 MG tablet Take 150 mg by mouth 2 (two) times a week. Tuesday for redness on vaginal area.   fluticasone (FLONASE) 50 MCG/ACT nasal spray Place 1 spray into both nostrils as needed for allergies or rhinitis.   gabapentin (NEURONTIN) 100 MG capsule Take 200 mg by mouth 2 (two) times daily.   Hydrocortisone Acetate (ANUSOL-HC RE) Place 1 application  rectally as needed.   insulin glargine (LANTUS) 100 UNIT/ML injection Inject 10 Units into the skin every morning.   Ketotifen Fumarate (EYE ITCH RELIEF OP) Place 1 drop into both eyes every 12 (twelve) hours as needed.   lisinopril (ZESTRIL) 2.5 MG tablet Take 2.5 mg by mouth in the morning.   loperamide (IMODIUM A-D) 2 MG tablet Take 2 mg by mouth every 12 (twelve) hours as needed for diarrhea or loose stools.   loratadine (CLARITIN) 10 MG tablet Take 10 mg by mouth every morning.   Melatonin 5 MG CAPS Take 1 capsule by mouth daily. As needed for insomnia   metFORMIN (GLUCOPHAGE) 500 MG tablet Take 250 mg by mouth every evening.   metFORMIN (GLUCOPHAGE) 500 MG tablet Take 500 mg by mouth every morning.   metoprolol succinate (TOPROL-XL) 100 MG 24 hr tablet Take 100 mg by mouth 2 (two) times daily. Take with or immediately following a meal.   pravastatin (PRAVACHOL) 20 MG tablet Take 20 mg  by mouth every evening.   sertraline (ZOLOFT) 50 MG tablet Take 1 tablet (50 mg total) by mouth daily.   sitaGLIPtin (JANUVIA) 50 MG tablet Take 1 tablet (50 mg total) by mouth daily.   zinc oxide 20 % ointment Apply 1 application  topically as needed (To buttocks after every incontinent epsiode and as needed for redness).   No facility-administered encounter medications on file as of 09/07/2023.    Review of Systems  Constitutional: Negative.   Respiratory:  Negative for shortness of breath.   Cardiovascular:  Negative for chest pain.  Genitourinary:  Positive for dysuria and vaginal pain.  Psychiatric/Behavioral:  Positive for confusion and dysphoric mood. Negative for sleep disturbance. The patient is not nervous/anxious.     Immunization History  Administered Date(s) Administered   Fluad Quad(high Dose 65+) 05/03/2022   Influenza Split 03/19/2012, 03/19/2013  Influenza, High Dose Seasonal PF 04/09/2014, 03/22/2015, 03/24/2016, 03/29/2018, 04/16/2020, 05/09/2023   Influenza,inj,Quad PF,6+ Mos 04/09/2014, 03/22/2015, 03/24/2016   Influenza-Unspecified 03/19/2012, 03/19/2013, 04/09/2017, 03/24/2018, 04/16/2020   Moderna Covid-19 Vaccine Bivalent Booster 73yrs & up 05/16/2023   Moderna SARS-COV2 Booster Vaccination 05/16/2022   Moderna Sars-Covid-2 Vaccination 05/19/2021   PFIZER(Purple Top)SARS-COV-2 Vaccination 08/17/2019, 09/10/2019, 04/06/2020, 11/19/2020   Pfizer Covid-19 Vaccine Bivalent Booster 59yrs & up 11/18/2020, 12/21/2021   Pneumococcal Conjugate-13 11/05/2014   Pneumococcal Polysaccharide-23 07/10/2004, 12/12/2010, 05/02/2016   Pneumococcal-Unspecified 07/10/2004, 12/12/2010, 05/02/2016   Respiratory Syncytial Virus Vaccine,Recomb Aduvanted(Arexvy) 06/24/2022   Td 07/10/2009   Tdap 07/06/2022   Zoster Recombinant(Shingrix) 04/09/2017, 07/19/2017   Zoster, Live 07/10/2006   Pertinent  Health Maintenance Due  Topic Date Due   HEMOGLOBIN A1C  12/11/2023    OPHTHALMOLOGY EXAM  04/15/2024   FOOT EXAM  05/13/2024   INFLUENZA VACCINE  Completed   DEXA SCAN  Completed      02/02/2023    2:47 PM 03/14/2023    3:13 PM 04/18/2023    2:05 PM 05/14/2023   12:34 PM 07/19/2023   10:42 AM  Fall Risk  Falls in the past year? 0 0 0 0 0  Was there an injury with Fall? 0 0 0 0 0  Fall Risk Category Calculator 0 0 0 0 0  Patient at Risk for Falls Due to History of fall(s);Impaired balance/gait;Impaired mobility History of fall(s);Impaired balance/gait History of fall(s);Impaired balance/gait;Impaired mobility History of fall(s);Impaired balance/gait;Impaired mobility History of fall(s);Impaired balance/gait;Impaired mobility  Fall risk Follow up Falls evaluation completed;Education provided;Falls prevention discussed Falls evaluation completed;Education provided  Falls evaluation completed;Education provided;Falls prevention discussed Falls evaluation completed;Education provided;Falls prevention discussed   Functional Status Survey:    Vitals:   09/07/23 1302  BP: 136/72  Pulse: 74  Resp: 17  Temp: (!) 97.4 F (36.3 C)  SpO2: 96%  Weight: 168 lb (76.2 kg)  Height: 5' (1.524 m)   Body mass index is 32.81 kg/m. Physical Exam Vitals reviewed. Exam conducted with a chaperone present.  Constitutional:      General: She is not in acute distress. HENT:     Head: Normocephalic.  Eyes:     General:        Right eye: No discharge.        Left eye: No discharge.  Cardiovascular:     Rate and Rhythm: Normal rate and regular rhythm.     Pulses: Normal pulses.     Heart sounds: Normal heart sounds.  Pulmonary:     Effort: Pulmonary effort is normal.     Breath sounds: Normal breath sounds.  Abdominal:     General: Bowel sounds are normal.     Palpations: Abdomen is soft.  Genitourinary:    Labia:        Left: Tenderness present.      Vagina: No vaginal discharge.     Comments: Redness and mild tenderness to minora Musculoskeletal:     Cervical  back: Neck supple.  Skin:    General: Skin is warm.     Capillary Refill: Capillary refill takes less than 2 seconds.  Neurological:     General: No focal deficit present.     Mental Status: She is alert. Mental status is at baseline.  Psychiatric:        Mood and Affect: Mood normal.     Labs reviewed: Recent Labs    11/23/22 0000 04/30/23 0000 05/03/23 0000  NA 139 137 138  K 4.5  4.1 4.5  CL 107 108 110*  CO2 22 17 16   BUN 21 32* 40*  CREATININE 1.3* 1.6* 1.4*  CALCIUM 8.8 8.5* 8.7   No results for input(s): "AST", "ALT", "ALKPHOS", "BILITOT", "PROT", "ALBUMIN" in the last 8760 hours. Recent Labs    11/23/22 0000 04/30/23 0000 05/03/23 0000  WBC 9.2 14.4 11.8  NEUTROABS 6,017.00 11,160.00 8,803.00  HGB 12.0 11.2* 11.4*  HCT 36 34* 35*  PLT 323 328 402*   Lab Results  Component Value Date   TSH 3.100 08/12/2019   Lab Results  Component Value Date   HGBA1C 7.4 06/12/2023   Lab Results  Component Value Date   CHOL 113 06/12/2023   HDL 38 06/12/2023   LDLCALC 53 06/12/2023   TRIG 135 06/12/2023   CHOLHDL 4.4 12/19/2018    Significant Diagnostic Results in last 30 days:  No results found.  Assessment/Plan 1. Subacute vaginitis (Primary) - ongoing - h/o painful micturition - increased redness and tenderness to vulvovaginal/ minora> no discharge - completed fluconazole and Clotrimazole 1% cream - suspect fungal infection has resolved - completed doxycycline x 10 days for suspect bacterial infection - vaginal wound culture> no growth - unsuccessful trial of estrace and mometasone  - gynecology appointment scheduled - will start Lyrica 75 mg po BID for symptoms    Family/ staff Communication: plan discussed with patient and nurse  Labs/tests ordered:  none

## 2023-09-13 ENCOUNTER — Telehealth: Payer: Self-pay | Admitting: Internal Medicine

## 2023-09-13 NOTE — Telephone Encounter (Signed)
 CBGS are still running high Will change her Lantus to 15 units QAM Continue Sliding Scale for extra coverage

## 2023-09-14 ENCOUNTER — Encounter: Payer: Self-pay | Admitting: Orthopedic Surgery

## 2023-09-14 ENCOUNTER — Non-Acute Institutional Stay (SKILLED_NURSING_FACILITY): Payer: Self-pay | Admitting: Orthopedic Surgery

## 2023-09-14 DIAGNOSIS — G629 Polyneuropathy, unspecified: Secondary | ICD-10-CM | POA: Diagnosis not present

## 2023-09-14 DIAGNOSIS — N761 Subacute and chronic vaginitis: Secondary | ICD-10-CM

## 2023-09-14 DIAGNOSIS — E1165 Type 2 diabetes mellitus with hyperglycemia: Secondary | ICD-10-CM

## 2023-09-14 NOTE — Progress Notes (Signed)
 Location:  [facility] Room Number: 26/A Place of Service:  SNF [phone]) Provider:  Octavia Heir, NP   [name], MD  Patient Care Team: [name], MD as PCP - General (Internal Medicine) Ernesto Rutherford, MD (Ophthalmology) Claud Kelp, MD as Consulting Physician (General Surgery) Carlus Pavlov, MD as Consulting Physician (Internal Medicine)  Extended Emergency Contact Information Primary Emergency Contact: [name],[name] Address: [address]          [place] [address] [place] Home Phone: [phone] Mobile Phone: [phone] Relation: Spouse  Code Status:  DNR Goals of care: Advanced Directive information    08/13/2023   10:29 AM  Advanced Directives  Does Patient Have a Medical Advance Directive? Yes  Type of Estate agent of [place];Living will;Out of facility DNR (pink MOST or yellow form)  Does patient want to make changes to medical advance directive? No - Patient declined  Copy of Healthcare Power of Attorney in Chart? No - copy requested     Chief Complaint  Patient presents with   Acute Visit    Chronic vaginal pain    HPI:  Pt is a 83 y.o. female seen today for acute visit due to chronic vaginal pain.   She currently resides on the skilled nursing unit at [facility]. PMH: HTN, HLD, GERD, IBS, T2DM, polyneuropathy, DJD, osteopenia, Factor 5 mutation, breast cancer s/p  left lumpectomy 1996, incontinence, and anxiety.   "H/o painful micturition> 1 year. She has been evaluated by urology in past. Urine cultures grew yeast. 04/02/2022 CT abdomen unremarkable. She was advised to use clotrimazole 1% cream BID prn. Invokana was discontinued. Unsuccessful trial Estradiol. Diflucan 150 mg once weekly was started. She had done well for awhile. C/o intermittent dysuria at times. She did receive a few rounds fluconazole 100 mg BID x 7 days and symptoms improved. 01/24 she c/o worsening  dysuria. Pain described " having sex with metal cheese grater." She had not been asking for clotrimazole cream> 1 month. White discharge was noted on exam. She was started on fluconazole 100 mg BID x 10 days and clotrimazole cream was scheduled. 02/03 symptoms improved but she continued to have pain. Clotrimazole cream BID was extended. Diflucan 150 mg increased to 2x/weekly. Today, pain has not improved. She continues to have increased pain when urinating. Denies hematuira or vaginal discharge. Afebrile. Vitals stable."   Completed doxycycline and mometasone cream without relief within past 3 weeks. 02/28 she was started on Lyrica due to chronic vaginal pain. Today, she reports improved symptoms. Denies pain x 3 days. She is on gabapentin for neuropathy. Plan to discontinue gabapentin today and continue Lyrica. Patient agrees with plan. She is scheduled to see gynecology soon for second opinion.     Past Medical History:  Diagnosis Date   Allergy    [name]; allergy shots weekly.     Anxiety    Arthritis    DDD lumbar spine.     Cancer (HCC)    breast L x 2; skin basal cell carcinoma [name]).   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   Past Surgical History:  Procedure Laterality Date   BREAST LUMPECTOMY  1996   L breast cancer   BREAST  SURGERY N/A    Phreesia 12/12/2019   EYE SURGERY N/A    Phreesia 12/12/2019   MASTECTOMY  2002   Bilateral for breast cancer   VAGINAL HYSTERECTOMY  1985   DUB; uterine fibroids; ovaries intact.    Allergies  Allergen Reactions   Fruit & Vegetable Daily [Nutritional Supplements] Diarrhea   Mixed Ragweed    Onion Other (See Comments)    indigestion   Tree Extract Swelling   Sulfa Drugs Cross Reactors Rash    All over the body     Outpatient Encounter Medications as of 09/14/2023  Medication Sig   acetaminophen (TYLENOL) 500 MG tablet Take 1,000 mg by mouth every 12 (twelve) hours as needed.   acetaminophen (TYLENOL) 500 MG tablet Take 1,000 mg by mouth every evening.   albuterol (VENTOLIN HFA) 108 (90 Base) MCG/ACT inhaler Inhale 2 puffs into the lungs every 6 (six) hours as needed for wheezing or shortness of breath.   antiseptic oral rinse (BIOTENE) LIQD 30 mLs by Mouth Rinse route 2 (two) times daily. For dry mouth   aspirin 81 MG chewable tablet Chew by mouth daily.   calcium carbonate (TUMS) 500 MG chewable tablet Chew 1-2 tablets (200-400 mg of elemental calcium total) by mouth 3 (three) times daily as needed for indigestion or heartburn.   fluconazole (DIFLUCAN) 150 MG tablet Take 150 mg by mouth 2 (two) times a week. Tuesday for redness on vaginal area.   fluticasone (FLONASE) 50 MCG/ACT nasal spray Place 1 spray into both nostrils as needed for allergies or rhinitis.   gabapentin (NEURONTIN) 100 MG capsule Take 200 mg by mouth 2 (two) times daily.   Hydrocortisone Acetate (ANUSOL-HC RE) Place 1 application  rectally as needed.   insulin aspart (NOVOLOG) 100 UNIT/ML injection Inject 5 Units into the skin 2 (two) times daily. 5 units if CBG more then 200 8 units if CBG more then 300   insulin glargine (LANTUS) 100 UNIT/ML injection Inject 15 Units into the skin every morning.   Ketotifen Fumarate (EYE ITCH RELIEF OP) Place 1 drop into both eyes every 12 (twelve) hours as needed.   lisinopril (ZESTRIL) 2.5 MG tablet Take 2.5 mg by mouth in the morning.   loperamide (IMODIUM A-D) 2 MG tablet Take 2 mg by mouth every 12 (twelve) hours as needed for diarrhea or loose stools.   loratadine (CLARITIN) 10 MG tablet Take 10 mg by mouth every morning.   Melatonin 5 MG CAPS Take 1 capsule by mouth daily. As needed for insomnia   metFORMIN (GLUCOPHAGE) 500 MG tablet Take 250 mg by mouth every evening.   metFORMIN  (GLUCOPHAGE) 500 MG tablet Take 500 mg by mouth every morning.   metoprolol succinate (TOPROL-XL) 100 MG 24 hr tablet Take 100 mg by mouth 2 (two) times daily. Take with or immediately following a meal.   pravastatin (PRAVACHOL) 20 MG tablet Take 20 mg by mouth every evening.   pregabalin (LYRICA) 75 MG capsule Take 1 capsule (75 mg total) by mouth 2 (two) times daily.   sertraline (ZOLOFT) 50 MG tablet Take 1 tablet (50 mg total) by mouth daily.   sitaGLIPtin (JANUVIA) 50 MG tablet Take 1 tablet (50 mg total) by mouth daily.   zinc oxide 20 % ointment Apply 1 application  topically as needed (To buttocks after every incontinent epsiode and as needed for redness).   No facility-administered encounter medications on file as of 09/14/2023.    Review of Systems  Constitutional: Negative.  Respiratory: Negative.    Cardiovascular: Negative.   Genitourinary:  Negative for dysuria and vaginal pain.  Psychiatric/Behavioral:  Positive for dysphoric mood. The patient is not nervous/anxious.     Immunization History  Administered Date(s) Administered   Fluad Quad(high Dose 65+) 05/03/2022   Influenza Split 03/19/2012, 03/19/2013   Influenza, High Dose Seasonal PF 04/09/2014, 03/22/2015, 03/24/2016, 03/29/2018, 04/16/2020, 05/09/2023   Influenza,inj,Quad PF,6+ Mos 04/09/2014, 03/22/2015, 03/24/2016   Influenza-Unspecified 03/19/2012, 03/19/2013, 04/09/2017, 03/24/2018, 04/16/2020   Moderna Covid-19 Vaccine Bivalent Booster 33yrs & up 05/16/2023   Moderna SARS-COV2 Booster Vaccination 05/16/2022   Moderna Sars-Covid-2 Vaccination 05/19/2021   PFIZER(Purple Top)SARS-COV-2 Vaccination 08/17/2019, 09/10/2019, 04/06/2020, 11/19/2020   Pfizer Covid-19 Vaccine Bivalent Booster 14yrs & up 11/18/2020, 12/21/2021   Pneumococcal Conjugate-13 11/05/2014   Pneumococcal Polysaccharide-23 07/10/2004, 12/12/2010, 05/02/2016   Pneumococcal-Unspecified 07/10/2004, 12/12/2010, 05/02/2016   Respiratory Syncytial  Virus Vaccine,Recomb Aduvanted(Arexvy) 06/24/2022   Td 07/10/2009   Tdap 07/06/2022   Zoster Recombinant(Shingrix) 04/09/2017, 07/19/2017   Zoster, Live 07/10/2006   Pertinent  Health Maintenance Due  Topic Date Due   HEMOGLOBIN A1C  12/11/2023   OPHTHALMOLOGY EXAM  04/15/2024   FOOT EXAM  05/13/2024   INFLUENZA VACCINE  Completed   DEXA SCAN  Completed      02/02/2023    2:47 PM 03/14/2023    3:13 PM 04/18/2023    2:05 PM 05/14/2023   12:34 PM 07/19/2023   10:42 AM  Fall Risk  Falls in the past year? 0 0 0 0 0  Was there an injury with Fall? 0 0 0 0 0  Fall Risk Category Calculator 0 0 0 0 0  Patient at Risk for Falls Due to History of fall(s);Impaired balance/gait;Impaired mobility History of fall(s);Impaired balance/gait History of fall(s);Impaired balance/gait;Impaired mobility History of fall(s);Impaired balance/gait;Impaired mobility History of fall(s);Impaired balance/gait;Impaired mobility  Fall risk Follow up Falls evaluation completed;Education provided;Falls prevention discussed Falls evaluation completed;Education provided  Falls evaluation completed;Education provided;Falls prevention discussed Falls evaluation completed;Education provided;Falls prevention discussed   Functional Status Survey:    Vitals:   09/14/23 1220  BP: 106/66  Pulse: 74  Resp: 18  Temp: (!) 96.8 F (36 C)  SpO2: 97%  Weight: 170 lb 3.2 oz (77.2 kg)  Height: 5' (1.524 m)   Body mass index is 33.24 kg/m. Physical Exam Vitals reviewed.  Constitutional:      General: She is not in acute distress. HENT:     Head: Normocephalic.  Eyes:     General:        Right eye: No discharge.        Left eye: No discharge.  Cardiovascular:     Rate and Rhythm: Normal rate and regular rhythm.     Pulses: Normal pulses.     Heart sounds: Normal heart sounds.  Pulmonary:     Effort: Pulmonary effort is normal.     Breath sounds: Normal breath sounds.  Musculoskeletal:     Cervical back: Neck  supple.  Skin:    General: Skin is warm.     Capillary Refill: Capillary refill takes less than 2 seconds.  Neurological:     General: No focal deficit present.     Mental Status: She is alert and oriented to person, place, and time.  Psychiatric:        Mood and Affect: Mood normal.     Labs reviewed: Recent Labs    11/23/22 0000 04/30/23 0000 05/03/23 0000  NA 139 137 138  K 4.5 4.1 4.5  CL 107 108 110*  CO2 22 17 16   BUN 21 32* 40*  CREATININE 1.3* 1.6* 1.4*  CALCIUM 8.8 8.5* 8.7   No results for input(s): "AST", "ALT", "ALKPHOS", "BILITOT", "PROT", "ALBUMIN" in the last 8760 hours. Recent Labs    11/23/22 0000 04/30/23 0000 05/03/23 0000  WBC 9.2 14.4 11.8  NEUTROABS 6,017.00 11,160.00 8,803.00  HGB 12.0 11.2* 11.4*  HCT 36 34* 35*  PLT 323 328 402*   Lab Results  Component Value Date   TSH 3.100 08/12/2019   Lab Results  Component Value Date   HGBA1C 7.4 06/12/2023   Lab Results  Component Value Date   CHOL 113 06/12/2023   HDL 38 06/12/2023   LDLCALC 53 06/12/2023   TRIG 135 06/12/2023   CHOLHDL 4.4 12/19/2018    Significant Diagnostic Results in last 30 days:  No results found.  Assessment/Plan 1. Subacute vaginitis (Primary) - ongoing - h/o painful micturition - increased redness and tenderness to vulvovaginal/ minora> no discharge - completed fluconazole and Clotrimazole 1% cream - suspect fungal infection has resolved - completed doxycycline x 10 days for suspect bacterial infection - vaginal wound culture> no growth - unsuccessful trial of estrace and mometasone  - gynecology appointment scheduled - 02/28 Lyrica 75 mg po BID for symptoms> denies pain x 3 days - continue Lyrica for now  2. Peripheral polyneuropathy - see above - discontinue gabapentin   3. Type 2 diabetes mellitus with hyperglycemia, without long-term current use of insulin (HCC) - started Lantus and SSI last week - CBG continue to be high - 03/06 Lantus  increased to 15 units per Dr. Chales Abrahams - cont SSI meal coverage    Family/ staff Communication: plan discussed with patient and nurse  Labs/tests ordered:  none

## 2023-09-17 ENCOUNTER — Encounter: Payer: Self-pay | Admitting: Internal Medicine

## 2023-09-18 ENCOUNTER — Encounter: Payer: Self-pay | Admitting: Obstetrics and Gynecology

## 2023-09-18 ENCOUNTER — Other Ambulatory Visit (HOSPITAL_COMMUNITY)
Admission: RE | Admit: 2023-09-18 | Discharge: 2023-09-18 | Disposition: A | Discharge status: Home / Self Care | Source: Ambulatory Visit | Attending: Obstetrics and Gynecology | Admitting: Obstetrics and Gynecology

## 2023-09-18 ENCOUNTER — Ambulatory Visit: Payer: Medicare PPO | Admitting: Obstetrics and Gynecology

## 2023-09-18 VITALS — BP 144/79 | HR 65 | Ht 61.0 in | Wt 172.0 lb

## 2023-09-18 DIAGNOSIS — N958 Other specified menopausal and perimenopausal disorders: Secondary | ICD-10-CM | POA: Diagnosis not present

## 2023-09-18 DIAGNOSIS — Z1339 Encounter for screening examination for other mental health and behavioral disorders: Secondary | ICD-10-CM | POA: Diagnosis not present

## 2023-09-18 DIAGNOSIS — R102 Pelvic and perineal pain: Secondary | ICD-10-CM

## 2023-09-18 MED ORDER — IMVEXXY STARTER PACK 4 MCG VA INST: 1.0000 | VAGINAL_INSERT | Freq: Every evening | VAGINAL | 0 refills | Status: DC

## 2023-09-18 MED ORDER — IMVEXXY MAINTENANCE PACK 4 MCG VA INST: 1.0000 | VAGINAL_INSERT | VAGINAL | 11 refills | Status: AC

## 2023-09-18 NOTE — Progress Notes (Unsigned)
 NEW GYNECOLOGY PATIENT Patient name: [name] [name] MRN [id number]  Date of birth: 1941-06-14 Chief Complaint:   NEW PATIENT/GYN     History:  [name] [name] is a 83 y.o. G2P0 being seen today for dysuria and vaginal pain.    History of over 1 year of dysuria. Predominalty having pain with urination but also having some intermittenly chronic vaginal pain that feels like "having sex with a cheese grater" in the vagina. Has not been on vaginal estrogen that her or her husband recall within the last year (notes may have been on estorgen about 20 years ago). Has been treated with several different creams for various bacterial and yeast infections. Denies having pain while in the room. Does not recall being started on lyrica for pain. Not sure if she has had pain with urination today. Notes that she has urinary incontinence and uses a diaper.   Presence of urinary incontinence with diaper use.    -moist innter labia minotra Loss of majority of labim minora architecture including fclitoral hood Alodynia t vaginal introitus Unable to visualzied urethra deu to disocmofr Atrophic vaginal introitus appreciated Significatn allodyni qtih insertio f cotton swabnormal labia majora iwht mild erythema of left goirn fild  Inner labia major appear borderline macerated due to chronic wetness with small line of white,       Gynecologic History No LMP recorded. Patient has had a hysterectomy. Contraception: {method:5051} Last Pap: ***. Result was {norm/abn:16337} with negative HPV Last Mammogram: {NA AND WUJWJXBJ:47829} Last Colonoscopy: {NA AND FAOZHYQM:57846}  Obstetric History OB History  Gravida Para Term Preterm AB Living  2     2  SAB IAB Ectopic Multiple Live Births      2    # Outcome Date GA Lbr Len/2nd Weight Sex Type Anes PTL Lv  2 Gravida 08/28/75    M Vag-Spont   LIV  1 Gravida 09/14/72     Vag-Spont   LIV    Past Medical History:  Diagnosis Date   Allergy    [name];  allergy shots weekly.     Anxiety    Arthritis    DDD lumbar spine.     Cancer (HCC)    breast L x 2; skin basal cell carcinoma [name]).   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    Past Surgical History:  Procedure Laterality Date   BREAST LUMPECTOMY  1996   L breast cancer   BREAST SURGERY N/A    Phreesia 12/12/2019   EYE SURGERY N/A    Phreesia 12/12/2019   MASTECTOMY  2002   Bilateral for breast cancer   VAGINAL HYSTERECTOMY  1985   DUB; uterine fibroids; ovaries intact.    Current Outpatient Medications on File Prior to Visit  Medication Sig Dispense Refill   acetaminophen (TYLENOL) 500 MG tablet Take 1,000 mg by mouth every 12 (twelve) hours as needed.     acetaminophen (TYLENOL) 500 MG tablet Take 1,000 mg by mouth every evening.     albuterol (VENTOLIN HFA) 108 (90 Base) MCG/ACT inhaler Inhale 2 puffs into the lungs every 6 (six) hours as needed for wheezing or shortness of breath. 8 g 0   antiseptic oral rinse (BIOTENE) LIQD 30 mLs by Mouth Rinse route 2 (two) times  daily. For dry mouth     aspirin 81 MG chewable tablet Chew by mouth daily.     calcium carbonate (TUMS) 500 MG chewable tablet Chew 1-2 tablets (200-400 mg of elemental calcium total) by mouth 3 (three) times daily as needed for indigestion or heartburn. 90 tablet 0   fluconazole (DIFLUCAN) 150 MG tablet Take 150 mg by mouth 2 (two) times a week. Tuesday for redness on vaginal area.     fluticasone (FLONASE) 50 MCG/ACT nasal spray Place 1 spray into both nostrils as needed for allergies or rhinitis.     Hydrocortisone Acetate (ANUSOL-HC RE) Place 1 application  rectally as needed.     insulin aspart (NOVOLOG) 100 UNIT/ML injection Inject 5 Units into the skin 2 (two) times daily. 5 units if CBG more then  200 8 units if CBG more then 300     insulin glargine (LANTUS) 100 UNIT/ML injection Inject 15 Units into the skin every morning.     Ketotifen Fumarate (EYE ITCH RELIEF OP) Place 1 drop into both eyes every 12 (twelve) hours as needed.     lisinopril (ZESTRIL) 2.5 MG tablet Take 2.5 mg by mouth in the morning.     loperamide (IMODIUM A-D) 2 MG tablet Take 2 mg by mouth every 12 (twelve) hours as needed for diarrhea or loose stools.     loratadine (CLARITIN) 10 MG tablet Take 10 mg by mouth every morning.     Melatonin 5 MG CAPS Take 1 capsule by mouth daily. As needed for insomnia     metFORMIN (GLUCOPHAGE) 500 MG tablet Take 250 mg by mouth every evening.     metFORMIN (GLUCOPHAGE) 500 MG tablet Take 500 mg by mouth every morning.     metoprolol succinate (TOPROL-XL) 100 MG 24 hr tablet Take 100 mg by mouth 2 (two) times daily. Take with or immediately following a meal.     pravastatin (PRAVACHOL) 20 MG tablet Take 20 mg by mouth every evening.     pregabalin (LYRICA) 75 MG capsule Take 1 capsule (75 mg total) by mouth 2 (two) times daily. 60 capsule 5   sertraline (ZOLOFT) 50 MG tablet Take 1 tablet (50 mg total) by mouth daily.     sitaGLIPtin (JANUVIA) 50 MG tablet Take 1 tablet (50 mg total) by mouth daily.     zinc oxide 20 % ointment Apply 1 application  topically as needed (To buttocks after every incontinent epsiode and as needed for redness).     No current facility-administered medications on file prior to visit.    Allergies  Allergen Reactions   Fruit & Vegetable Daily [Nutritional Supplements] Diarrhea   Mixed Ragweed    Onion Other (See Comments)    indigestion   Tree Extract Swelling   Sulfa Drugs Cross Reactors Rash    All over the body    Social History:  reports that she has never smoked. She has never used smokeless tobacco. She reports that she does not drink alcohol and does not use drugs.  Family History  Problem Relation Age of Onset   Heart disease Mother         AMI as cause of death age 61.   Heart disease Father        AMI as cause of death.   Heart disease Brother     The following portions of the patient's history were reviewed and updated as appropriate: allergies, current medications, past family history, past medical history, past social history, past  surgical history and problem list.  Review of Systems Pertinent items noted in HPI and remainder of comprehensive ROS otherwise negative.  Physical Exam:  BP (!) 144/79 (BP Location: Right Arm, Cuff Size: Normal)   Pulse 65   Ht 5\' 1"  (1.549 m)   Wt 172 lb (78 kg)   BMI 32.50 kg/m  Physical Exam Vitals and nursing note reviewed. Exam conducted with a chaperone present.  Constitutional:      Appearance: Normal appearance.  Pulmonary:     Effort: Pulmonary effort is normal.  Abdominal:     Palpations: Abdomen is soft.     Comments: *** [place]  Genitourinary:    General: Normal vulva.     Exam position: Lithotomy position.     Comments: Normal labia major with slight erythema in right groin fold  Near complete loss of labia minora with fusion superiorly, no clitoris or clitoral hood appreciated Narrowed and atrophied vaginal introitus Unable to visualize urethra due to discomfort Allodynia with insertion of cotton swab into vagina  Mild discomfort of inner labia majora Noted to have significant moisture overlying inner labia majora Neurological:     Mental Status: She is alert.        Assessment and Plan:   There are no diagnoses linked to this encounter.   Routine preventative health maintenance measures emphasized. Please refer to After Visit Summary for other counseling recommendations.   Follow-up: No follow-ups on file.      Lorriane Shire, MD Obstetrician & Gynecologist, [facility] [employer], [facility]

## 2023-09-18 NOTE — Progress Notes (Addendum)
 83 y.o. New GYN presents for dysuria x 1 year. Pt was seen by Urologist and treated with Antibiotics, however the painful urination continues 8/10.  Denies fever, chills, NV.

## 2023-09-19 ENCOUNTER — Encounter: Payer: Self-pay | Admitting: Obstetrics and Gynecology

## 2023-09-19 ENCOUNTER — Other Ambulatory Visit: Payer: Self-pay

## 2023-09-19 LAB — CERVICOVAGINAL ANCILLARY ONLY
Bacterial Vaginitis (gardnerella): NEGATIVE
Candida Glabrata: POSITIVE — AB
Candida Vaginitis: NEGATIVE
Comment: NEGATIVE
Comment: NEGATIVE
Comment: NEGATIVE

## 2023-09-19 NOTE — Progress Notes (Signed)
 PA submitted to [facility] for Imvexxy

## 2023-09-20 ENCOUNTER — Non-Acute Institutional Stay (SKILLED_NURSING_FACILITY): Payer: Self-pay | Admitting: Internal Medicine

## 2023-09-20 DIAGNOSIS — E1165 Type 2 diabetes mellitus with hyperglycemia: Secondary | ICD-10-CM | POA: Diagnosis not present

## 2023-09-20 DIAGNOSIS — G629 Polyneuropathy, unspecified: Secondary | ICD-10-CM | POA: Diagnosis not present

## 2023-09-21 NOTE — Progress Notes (Signed)
 Location: [facility] Service:  SNF (31)  Provider:   Code Status: DNR Goals of Care:     08/13/2023   10:29 AM  Advanced Directives  Does Patient Have a Medical Advance Directive? Yes  Type of Estate agent of [place];Living will;Out of facility DNR (pink MOST or yellow form)  Does patient want to make changes to medical advance directive? No - Patient declined  Copy of Healthcare Power of Attorney in Chart? No - copy requested     Chief Complaint  Patient presents with   Acute Visit    HPI: Patient is a 83 y.o. female seen today for an acute visit for Follow up of her High Blood Sugars Lives in SNF in [facility]   Patient has a history of type 2 diabetes CBGS are running more then 200 through out the day Glipizide was stopped Now on Insulin  Chronic Vaginitis Seen By GYN  Swab positive for Candida Glabrata recommended Vaginal Boric acid followed By Andrey Campanile  She is now also on Lyrica and Low dose of Neurontin to help with Neuropathy  Other History History of neurodegenerative dementia Seen Neurology in 03/21 [facility]SE 25/30 MRI Moderate atrophy. Mild progression of white matter changes most likely due to chronic microvascular ischemia No workup since then Stays in her Wheelchair Needs help with her transfers 2 assists  Urinary Incontinence  Insomnia, And Neuropathy Chronic Candidiasis CT abdomen/pelvis negative for hydronephrosis, renal calculi, neoplasm   Past Medical History:  Diagnosis Date   Allergy    [name]; allergy shots weekly.     Anxiety    Arthritis    DDD lumbar spine.     Cancer (HCC)    breast L x 2; skin basal cell carcinoma [name]).   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    Past Surgical History:   Procedure Laterality Date   BREAST LUMPECTOMY  1996   L breast cancer   BREAST SURGERY N/A    Phreesia 12/12/2019   EYE SURGERY N/A    Phreesia 12/12/2019   MASTECTOMY  2002   Bilateral for breast cancer   VAGINAL HYSTERECTOMY  1985   DUB; uterine fibroids; ovaries intact.    Allergies  Allergen Reactions   Fruit & Vegetable Daily [Nutritional Supplements] Diarrhea   Mixed Ragweed    Onion Other (See Comments)    indigestion   Tree Extract Swelling   Sulfa Drugs Cross Reactors Rash    All over the body    Outpatient Encounter Medications as of 09/20/2023  Medication Sig   acetaminophen (TYLENOL) 500 MG tablet Take 1,000 mg by mouth every 12 (twelve) hours as needed.   acetaminophen (TYLENOL) 500 MG tablet Take 1,000 mg by mouth every evening.   albuterol (VENTOLIN HFA) 108 (90 Base) MCG/ACT inhaler Inhale 2 puffs into the lungs every 6 (six) hours as needed for wheezing or shortness of breath.   antiseptic oral rinse (BIOTENE) LIQD 30 mLs by Mouth Rinse route 2 (two) times daily. For dry mouth   aspirin 81 MG chewable tablet Chew by mouth daily.   calcium carbonate (TUMS) 500 MG chewable tablet Chew 1-2 tablets (200-400 mg of elemental calcium total) by mouth 3 (three) times daily as needed for  indigestion or heartburn.   Estradiol (IMVEXXY MAINTENANCE PACK) 4 MCG INST Place 1 suppository vaginally 2 (two) times a week.   Estradiol Starter Pack (IMVEXXY STARTER PACK) 4 MCG INST Place 1 suppository vaginally at bedtime.   fluconazole (DIFLUCAN) 150 MG tablet Take 150 mg by mouth 2 (two) times a week. Tuesday for redness on vaginal area.   fluticasone (FLONASE) 50 MCG/ACT nasal spray Place 1 spray into both nostrils as needed for allergies or rhinitis.   Hydrocortisone Acetate (ANUSOL-HC RE) Place 1 application  rectally as needed.   insulin aspart (NOVOLOG) 100 UNIT/ML injection Inject 5 Units into the skin 2 (two) times daily. 5 units if CBG more then 200 8 units if CBG more  then 300   insulin glargine (LANTUS) 100 UNIT/ML injection Inject 15 Units into the skin every morning.   Ketotifen Fumarate (EYE ITCH RELIEF OP) Place 1 drop into both eyes every 12 (twelve) hours as needed.   lisinopril (ZESTRIL) 2.5 MG tablet Take 2.5 mg by mouth in the morning.   loperamide (IMODIUM A-D) 2 MG tablet Take 2 mg by mouth every 12 (twelve) hours as needed for diarrhea or loose stools.   loratadine (CLARITIN) 10 MG tablet Take 10 mg by mouth every morning.   Melatonin 5 MG CAPS Take 1 capsule by mouth daily. As needed for insomnia   metFORMIN (GLUCOPHAGE) 500 MG tablet Take 250 mg by mouth every evening.   metFORMIN (GLUCOPHAGE) 500 MG tablet Take 500 mg by mouth every morning.   metoprolol succinate (TOPROL-XL) 100 MG 24 hr tablet Take 100 mg by mouth 2 (two) times daily. Take with or immediately following a meal.   pravastatin (PRAVACHOL) 20 MG tablet Take 20 mg by mouth every evening.   pregabalin (LYRICA) 75 MG capsule Take 1 capsule (75 mg total) by mouth 2 (two) times daily.   sertraline (ZOLOFT) 50 MG tablet Take 1 tablet (50 mg total) by mouth daily.   sitaGLIPtin (JANUVIA) 50 MG tablet Take 1 tablet (50 mg total) by mouth daily.   zinc oxide 20 % ointment Apply 1 application  topically as needed (To buttocks after every incontinent epsiode and as needed for redness).   No facility-administered encounter medications on file as of 09/20/2023.    Review of Systems:  Review of Systems  Constitutional:  Negative for activity change and appetite change.  HENT: Negative.    Respiratory:  Negative for cough and shortness of breath.   Cardiovascular:  Negative for leg swelling.  Gastrointestinal:  Negative for constipation.  Genitourinary:  Positive for vaginal pain.  Musculoskeletal:  Positive for gait problem. Negative for arthralgias and myalgias.  Skin: Negative.   Neurological:  Negative for dizziness and weakness.  Psychiatric/Behavioral:  Positive for confusion.  Negative for dysphoric mood and sleep disturbance.     Health Maintenance  Topic Date Due   COVID-19 Vaccine (9 - 2024-25 season) 07/11/2023   HEMOGLOBIN A1C  12/11/2023   Medicare Annual Wellness (AWV)  02/02/2024   Diabetic kidney evaluation - Urine ACR  03/21/2024   OPHTHALMOLOGY EXAM  04/15/2024   Diabetic kidney evaluation - eGFR measurement  05/02/2024   FOOT EXAM  05/13/2024   DTaP/Tdap/Td (3 - Td or Tdap) 07/06/2032   Pneumonia Vaccine 46+ Years old  Completed   INFLUENZA VACCINE  Completed   DEXA SCAN  Completed   Zoster Vaccines- Shingrix  Completed   HPV VACCINES  Aged Out    Physical Exam: There were no vitals filed  for this visit. There is no height or weight on file to calculate BMI. Physical Exam Vitals reviewed.  Constitutional:      Appearance: Normal appearance.  HENT:     Head: Normocephalic.     Nose: Nose normal.     Mouth/Throat:     Mouth: Mucous membranes are moist.     Pharynx: Oropharynx is clear.  Eyes:     Pupils: Pupils are equal, round, and reactive to light.  Cardiovascular:     Rate and Rhythm: Normal rate and regular rhythm.     Pulses: Normal pulses.     Heart sounds: Normal heart sounds. No murmur heard. Pulmonary:     Effort: Pulmonary effort is normal.     Breath sounds: Normal breath sounds.  Abdominal:     General: Abdomen is flat. Bowel sounds are normal.     Palpations: Abdomen is soft.  Musculoskeletal:        General: No swelling.     Cervical back: Neck supple.  Skin:    General: Skin is warm.  Neurological:     General: No focal deficit present.     Mental Status: She is alert.  Psychiatric:        Mood and Affect: Mood normal.        Thought Content: Thought content normal.     Labs reviewed: Basic Metabolic Panel: Recent Labs    11/23/22 0000 04/30/23 0000 05/03/23 0000  NA 139 137 138  K 4.5 4.1 4.5  CL 107 108 110*  CO2 22 17 16   BUN 21 32* 40*  CREATININE 1.3* 1.6* 1.4*  CALCIUM 8.8 8.5* 8.7    Liver Function Tests: No results for input(s): "AST", "ALT", "ALKPHOS", "BILITOT", "PROT", "ALBUMIN" in the last 8760 hours. No results for input(s): "LIPASE", "AMYLASE" in the last 8760 hours. No results for input(s): "AMMONIA" in the last 8760 hours. CBC: Recent Labs    11/23/22 0000 04/30/23 0000 05/03/23 0000  WBC 9.2 14.4 11.8  NEUTROABS 6,017.00 11,160.00 8,803.00  HGB 12.0 11.2* 11.4*  HCT 36 34* 35*  PLT 323 328 402*   Lipid Panel: Recent Labs    06/12/23 0000  CHOL 113  HDL 38  LDLCALC 53  TRIG 135   Lab Results  Component Value Date   HGBA1C 7.4 06/12/2023    Procedures since last visit: No results found.  Assessment/Plan 1. 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 Peripheral polyneuropathy Seems to be doing well with Lyrica and Gabapentin    Labs/tests ordered:  * No order type specified * Next appt:  Visit date not found

## 2023-09-21 NOTE — Progress Notes (Signed)
 TC. No answer. Left HIPAA compliant VM with call back number and advised to read [facility] message from Dr. Briscoe Deutscher.

## 2023-09-24 NOTE — Progress Notes (Signed)
 Husband called back and has passed the information on to the pts care facility.

## 2023-09-26 ENCOUNTER — Encounter: Payer: Self-pay | Admitting: Internal Medicine

## 2023-09-26 NOTE — Telephone Encounter (Signed)
 SNF resident bat [facility], all orders and communications to be handled through the facility staff.

## 2023-09-27 ENCOUNTER — Non-Acute Institutional Stay (SKILLED_NURSING_FACILITY): Payer: Self-pay | Admitting: Internal Medicine

## 2023-09-27 DIAGNOSIS — F03A18 Unspecified dementia, mild, with other behavioral disturbance: Secondary | ICD-10-CM | POA: Diagnosis not present

## 2023-09-27 DIAGNOSIS — N761 Subacute and chronic vaginitis: Secondary | ICD-10-CM | POA: Diagnosis not present

## 2023-09-27 DIAGNOSIS — E1165 Type 2 diabetes mellitus with hyperglycemia: Secondary | ICD-10-CM | POA: Diagnosis not present

## 2023-09-27 DIAGNOSIS — N1832 Chronic kidney disease, stage 3b: Secondary | ICD-10-CM | POA: Diagnosis not present

## 2023-09-27 NOTE — Progress Notes (Unsigned)
 [facility] Service:  SNF ([id number])  Provider:   Code Status: DNR Goals of Care:     08/13/2023   10:29 AM  Advanced Directives  Does Patient Have a Medical Advance Directive? Yes  Type of Estate agent of [place];Living will;Out of facility DNR (pink MOST or yellow form)  Does patient want to make changes to medical advance directive? No - Patient declined  Copy of Healthcare Power of Attorney in Chart? No - copy requested     Chief Complaint  Patient presents with   Acute Visit    HPI: Patient is a 83 y.o. female seen today for an acute visit for Follow up Lives in SNF in [facility]   Patient has a history of type 2 diabetes CBGS are running better on Insulin still runs around 180-200 Husband wants her to use Dex com  Also she does not want to be stuck many times a day Glipizide was stopped    Chronic Vaginitis Seen By GYN  Swab positive for Candida Glabrata recommended Vaginal Boric acid followed By Andrey Campanile  She is doing well right now with this   She is now also on Lyrica and Low dose of Neurontin to help with Neuropathy    History of neurodegenerative dementia Seen Neurology in 03/21 MMSE 25/30 MRI Moderate atrophy. Mild progression of white matter changes most likely due to chronic microvascular ischemia No workup since then Stays in her Wheelchair Needs help with her transfers 2 assists  She wants to start Pravagen to help her Memory   Other issues  Urinary Incontinence   Insomnia, And Neuropathy Chronic Candidiasis CT abdomen/pelvis negative for hydronephrosis, renal calculi, neoplasm      Past Medical History:  Diagnosis Date   Allergy    [name]; allergy shots weekly.     Anxiety    Arthritis    DDD lumbar spine.     Cancer (HCC)    breast L x 2; skin basal cell carcinoma [name]).   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    Past Surgical History:  Procedure Laterality Date   BREAST LUMPECTOMY  1996   L breast cancer   BREAST SURGERY N/A    Phreesia 12/12/2019   EYE SURGERY N/A    Phreesia 12/12/2019   MASTECTOMY  2002   Bilateral for breast cancer   VAGINAL HYSTERECTOMY  1985   DUB; uterine fibroids; ovaries intact.    Allergies  Allergen Reactions   Fruit & Vegetable Daily [Nutritional Supplements] Diarrhea   Mixed Ragweed    Onion Other (See Comments)    indigestion   Tree Extract Swelling   Sulfa Drugs Cross Reactors Rash    All over the body    Outpatient Encounter Medications as of 09/27/2023  Medication Sig   acetaminophen (TYLENOL) 500 MG tablet Take 1,000 mg by mouth every 12 (twelve) hours as needed.   acetaminophen (TYLENOL) 500 MG tablet Take 1,000 mg by mouth every evening.   albuterol (VENTOLIN HFA) 108 (90 Base) MCG/ACT inhaler Inhale 2 puffs into the lungs every 6 (six) hours as needed for wheezing or shortness of breath.   antiseptic oral rinse (BIOTENE) LIQD 30 mLs by Mouth Rinse route 2 (two) times  daily. For dry mouth   aspirin 81 MG chewable tablet Chew by mouth daily.   calcium carbonate (TUMS) 500 MG chewable tablet Chew 1-2 tablets (200-400 mg of elemental calcium total) by mouth 3 (three) times daily as needed for indigestion or heartburn.   Estradiol (IMVEXXY MAINTENANCE PACK) 4 MCG INST Place 1 suppository vaginally 2 (two) times a week.   Estradiol Starter Pack (IMVEXXY STARTER PACK) 4 MCG INST Place 1 suppository vaginally at bedtime.   fluconazole (DIFLUCAN) 150 MG tablet Take 150 mg by mouth 2 (two) times a week. Tuesday for redness on vaginal area.   fluticasone (FLONASE) 50 MCG/ACT nasal spray Place 1 spray into both nostrils as needed for allergies or rhinitis.   Hydrocortisone Acetate (ANUSOL-HC RE) Place 1 application   rectally as needed.   insulin aspart (NOVOLOG) 100 UNIT/ML injection Inject 5 Units into the skin 2 (two) times daily. 5 units if CBG more then 200 8 units if CBG more then 300   insulin glargine (LANTUS) 100 UNIT/ML injection Inject 20 Units into the skin every morning.   Ketotifen Fumarate (EYE ITCH RELIEF OP) Place 1 drop into both eyes every 12 (twelve) hours as needed.   lisinopril (ZESTRIL) 2.5 MG tablet Take 2.5 mg by mouth in the morning.   loperamide (IMODIUM A-D) 2 MG tablet Take 2 mg by mouth every 12 (twelve) hours as needed for diarrhea or loose stools.   loratadine (CLARITIN) 10 MG tablet Take 10 mg by mouth every morning.   Melatonin 5 MG CAPS Take 1 capsule by mouth daily. As needed for insomnia   metFORMIN (GLUCOPHAGE) 500 MG tablet Take 250 mg by mouth every evening.   metFORMIN (GLUCOPHAGE) 500 MG tablet Take 500 mg by mouth every morning.   metoprolol succinate (TOPROL-XL) 100 MG 24 hr tablet Take 100 mg by mouth 2 (two) times daily. Take with or immediately following a meal.   pravastatin (PRAVACHOL) 20 MG tablet Take 20 mg by mouth every evening.   pregabalin (LYRICA) 75 MG capsule Take 1 capsule (75 mg total) by mouth 2 (two) times daily.   sertraline (ZOLOFT) 50 MG tablet Take 1 tablet (50 mg total) by mouth daily.   sitaGLIPtin (JANUVIA) 50 MG tablet Take 1 tablet (50 mg total) by mouth daily.   zinc oxide 20 % ointment Apply 1 application  topically as needed (To buttocks after every incontinent epsiode and as needed for redness).   No facility-administered encounter medications on file as of 09/27/2023.    Review of Systems:  Review of Systems  Constitutional:  Negative for activity change and appetite change.  HENT: Negative.    Respiratory:  Negative for cough and shortness of breath.   Cardiovascular:  Negative for leg swelling.  Gastrointestinal:  Negative for constipation.  Genitourinary: Negative.   Musculoskeletal:  Positive for arthralgias and gait  problem. Negative for myalgias.  Skin: Negative.   Neurological:  Negative for dizziness and weakness.  Psychiatric/Behavioral:  Positive for confusion. Negative for dysphoric mood and sleep disturbance.     Health Maintenance  Topic Date Due   COVID-19 Vaccine (9 - 2024-25 season) 07/11/2023   HEMOGLOBIN A1C  12/11/2023   Medicare Annual Wellness (AWV)  02/02/2024   Diabetic kidney evaluation - Urine ACR  03/21/2024   OPHTHALMOLOGY EXAM  04/15/2024   Diabetic kidney evaluation - eGFR measurement  05/02/2024   FOOT EXAM  05/13/2024   DTaP/Tdap/Td (3 - Td or Tdap) 07/06/2032   Pneumonia Vaccine 65+ Years  old  Completed   INFLUENZA VACCINE  Completed   DEXA SCAN  Completed   Zoster Vaccines- Shingrix  Completed   HPV VACCINES  Aged Out    Physical Exam: Vitals:   09/27/23 0941  BP: 116/73  Pulse: 69  Resp: 19  Temp: (!) 97.3 F (36.3 C)  Weight: 170 lb (77.1 kg)   Body mass index is 32.12 kg/m. Physical Exam Vitals reviewed.  Constitutional:      Appearance: Normal appearance.  HENT:     Head: Normocephalic.     Nose: Nose normal.     Mouth/Throat:     Mouth: Mucous membranes are moist.     Pharynx: Oropharynx is clear.  Eyes:     Pupils: Pupils are equal, round, and reactive to light.  Cardiovascular:     Rate and Rhythm: Normal rate and regular rhythm.     Pulses: Normal pulses.     Heart sounds: Normal heart sounds. No murmur heard. Pulmonary:     Effort: Pulmonary effort is normal.     Breath sounds: Normal breath sounds.  Abdominal:     General: Abdomen is flat. Bowel sounds are normal.     Palpations: Abdomen is soft.  Musculoskeletal:        General: No swelling.     Cervical back: Neck supple.  Skin:    General: Skin is warm.  Neurological:     General: No focal deficit present.     Mental Status: She is alert and oriented to person, place, and time.  Psychiatric:        Mood and Affect: Mood normal.        Thought Content: Thought content  normal.     Labs reviewed: Basic Metabolic Panel: Recent Labs    11/23/22 0000 04/30/23 0000 05/03/23 0000  NA 139 137 138  K 4.5 4.1 4.5  CL 107 108 110*  CO2 22 17 16   BUN 21 32* 40*  CREATININE 1.3* 1.6* 1.4*  CALCIUM 8.8 8.5* 8.7   Liver Function Tests: No results for input(s): "AST", "ALT", "ALKPHOS", "BILITOT", "PROT", "ALBUMIN" in the last 8760 hours. No results for input(s): "LIPASE", "AMYLASE" in the last 8760 hours. No results for input(s): "AMMONIA" in the last 8760 hours. CBC: Recent Labs    11/23/22 0000 04/30/23 0000 05/03/23 0000  WBC 9.2 14.4 11.8  NEUTROABS 6,017.00 11,160.00 8,803.00  HGB 12.0 11.2* 11.4*  HCT 36 34* 35*  PLT 323 328 402*   Lipid Panel: Recent Labs    06/12/23 0000  CHOL 113  HDL 38  LDLCALC 53  TRIG 135   Lab Results  Component Value Date   HGBA1C 7.4 06/12/2023    Procedures since last visit: No results found.  Assessment/Plan 1. Type 2 diabetes mellitus with hyperglycemia, without long-term current use of insulin (HCC) (Primary) Will only do fasting  Accu check in the morning while awating Dex com Would hold off checkin CBGS latter in the day Most of her Sugars are running in 180-200 DEX com has been ordered  2. Subacute vaginitis Doing Boric Acid and then Plans to do Estradiol as per GYN  3. Stage 3b chronic kidney disease (HCC) Creat is stable  4. Mild dementia with other behavioral disturbance, unspecified dementia type [facility]) She has seen Neurology before She was recommended Namenda but had refused before Open to try now Namenda 5 mg every day for 1 week and then 5 mg BID    Labs/tests ordered:  Next appt:  Visit date not found

## 2023-09-28 ENCOUNTER — Encounter: Payer: Self-pay | Admitting: Internal Medicine

## 2023-10-01 ENCOUNTER — Non-Acute Institutional Stay (SKILLED_NURSING_FACILITY): Payer: Self-pay | Admitting: Orthopedic Surgery

## 2023-10-01 ENCOUNTER — Encounter: Payer: Self-pay | Admitting: Orthopedic Surgery

## 2023-10-01 DIAGNOSIS — G629 Polyneuropathy, unspecified: Secondary | ICD-10-CM

## 2023-10-01 DIAGNOSIS — F03A18 Unspecified dementia, mild, with other behavioral disturbance: Secondary | ICD-10-CM | POA: Diagnosis not present

## 2023-10-01 DIAGNOSIS — N761 Subacute and chronic vaginitis: Secondary | ICD-10-CM | POA: Diagnosis not present

## 2023-10-01 DIAGNOSIS — E785 Hyperlipidemia, unspecified: Secondary | ICD-10-CM

## 2023-10-01 DIAGNOSIS — E1165 Type 2 diabetes mellitus with hyperglycemia: Secondary | ICD-10-CM

## 2023-10-01 DIAGNOSIS — E1169 Type 2 diabetes mellitus with other specified complication: Secondary | ICD-10-CM

## 2023-10-01 DIAGNOSIS — N1832 Chronic kidney disease, stage 3b: Secondary | ICD-10-CM

## 2023-10-01 DIAGNOSIS — F339 Major depressive disorder, recurrent, unspecified: Secondary | ICD-10-CM

## 2023-10-01 DIAGNOSIS — I10 Essential (primary) hypertension: Secondary | ICD-10-CM | POA: Diagnosis not present

## 2023-10-01 NOTE — Progress Notes (Signed)
 Location:  [facility] Room Number: 26/A Place of Service:  SNF [phone]) Provider:  Octavia Heir, NP   [name], MD  Patient Care Team: [name], MD as PCP - General (Internal Medicine) Ernesto Rutherford, MD (Ophthalmology) Claud Kelp, MD as Consulting Physician (General Surgery) Carlus Pavlov, MD as Consulting Physician (Internal Medicine)  Extended Emergency Contact Information Primary Emergency Contact: [name],[name] Address: [address]          [place] [address] [place] Home Phone: [phone] Mobile Phone: [phone] Relation: Spouse  Code Status:  DNR Goals of care: Advanced Directive information    08/13/2023   10:29 AM  Advanced Directives  Does Patient Have a Medical Advance Directive? Yes  Type of Estate agent of [place];Living will;Out of facility DNR (pink MOST or yellow form)  Does patient want to make changes to medical advance directive? No - Patient declined  Copy of Healthcare Power of Attorney in Chart? No - copy requested     Chief Complaint  Patient presents with   Medical Management of Chronic Issues    HPI:  Pt is a 83 y.o. female seen today for medical management of chronic diseases.    She currently resides on the skilled nursing unit at [facility]. PMH: HTN, HLD, GERD, IBS, T2DM, polyneuropathy, DJD, osteopenia, Factor 5 mutation, breast cancer s/p  left lumpectomy 1996, incontinence, and anxiety.    Followed by palliative. Hospice discharged last year.     Subacute vaginitis- increased pain when urinating> " feels like a metal cheese grater", CT abdomen/pelvis negative for hydronephrosis/calculi/neoplasm, past cultures grew yeast, recently seen by gynecology, vaginal swab positive for Candida Galabrata> boric acid then Estrace recommended, reports improved symptoms since starting boric acid T2DM- A1c 7.4 (12/02)> was  8.7 (09/05), urine microalbumin  03/15/2023> normal, no hypoglycemic events, blood sugars 170-200's, off Invokana due to frequent genitourinary infections, remains on reduced metformin due to declining renal function, Lantus and SSI started last month, no hypoglycemia> sugars 150-235 Dementia-  MMSE 25/30 2021, BIMS score 15/15 (12/27)> was 15/15 (06/24), diagnosed with neurodegenerative disorder per neurology, ambulates with w/c, continues to have behaviors towards staff/husband, remains on Namenda HTN- BUN/creat 40/1.41, GFR 37 05/08/2023, remains on metoprolol and lisinopril HLD- total 113, LDL 53 (12/02), on pravastatin CKD- see above  Peripheral neuropathy- remains on Lyrica and gabapentin Depression- Na+ 13 05/08/2023, associated with current health state and living in SNF, remains on Zoloft     Past Medical History:  Diagnosis Date   Allergy    [name]; allergy shots weekly.     Anxiety    Arthritis    DDD lumbar spine.     Cancer (HCC)    breast L x 2; skin basal cell carcinoma [name]).   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   Past Surgical History:  Procedure Laterality Date   BREAST LUMPECTOMY  1996   L breast cancer   BREAST SURGERY N/A    Phreesia 12/12/2019   EYE SURGERY N/A    Phreesia 12/12/2019   MASTECTOMY  2002   Bilateral for breast cancer   VAGINAL HYSTERECTOMY  1985   DUB; uterine fibroids; ovaries intact.    Allergies  Allergen Reactions  Fruit & Vegetable Daily [Nutritional Supplements] Diarrhea   Mixed Ragweed    Onion Other (See Comments)    indigestion   Tree Extract Swelling   Sulfa Drugs Cross Reactors Rash    All over the body    Outpatient Encounter Medications as of 10/01/2023  Medication Sig   acetaminophen (TYLENOL) 500 MG tablet Take 1,000 mg by mouth every 12  (twelve) hours as needed.   acetaminophen (TYLENOL) 500 MG tablet Take 1,000 mg by mouth every evening.   albuterol (VENTOLIN HFA) 108 (90 Base) MCG/ACT inhaler Inhale 2 puffs into the lungs every 6 (six) hours as needed for wheezing or shortness of breath.   antiseptic oral rinse (BIOTENE) LIQD 30 mLs by Mouth Rinse route 2 (two) times daily. For dry mouth   aspirin 81 MG chewable tablet Chew by mouth daily.   calcium carbonate (TUMS) 500 MG chewable tablet Chew 1-2 tablets (200-400 mg of elemental calcium total) by mouth 3 (three) times daily as needed for indigestion or heartburn.   Estradiol (IMVEXXY MAINTENANCE PACK) 4 MCG INST Place 1 suppository vaginally 2 (two) times a week.   Estradiol Starter Pack (IMVEXXY STARTER PACK) 4 MCG INST Place 1 suppository vaginally at bedtime.   fluconazole (DIFLUCAN) 150 MG tablet Take 150 mg by mouth 2 (two) times a week. Tuesday for redness on vaginal area.   fluticasone (FLONASE) 50 MCG/ACT nasal spray Place 1 spray into both nostrils as needed for allergies or rhinitis.   Hydrocortisone Acetate (ANUSOL-HC RE) Place 1 application  rectally as needed.   insulin aspart (NOVOLOG) 100 UNIT/ML injection Inject 5 Units into the skin 2 (two) times daily. 5 units if CBG more then 200 8 units if CBG more then 300   insulin glargine (LANTUS) 100 UNIT/ML injection Inject 20 Units into the skin every morning.   Ketotifen Fumarate (EYE ITCH RELIEF OP) Place 1 drop into both eyes every 12 (twelve) hours as needed.   lisinopril (ZESTRIL) 2.5 MG tablet Take 2.5 mg by mouth in the morning.   loperamide (IMODIUM A-D) 2 MG tablet Take 2 mg by mouth every 12 (twelve) hours as needed for diarrhea or loose stools.   loratadine (CLARITIN) 10 MG tablet Take 10 mg by mouth every morning.   Melatonin 5 MG CAPS Take 1 capsule by mouth daily. As needed for insomnia   memantine (NAMENDA) 5 MG tablet Take 5 mg by mouth 2 (two) times daily.   metFORMIN (GLUCOPHAGE) 500 MG tablet Take  250 mg by mouth every evening.   metFORMIN (GLUCOPHAGE) 500 MG tablet Take 500 mg by mouth every morning.   metoprolol succinate (TOPROL-XL) 100 MG 24 hr tablet Take 100 mg by mouth 2 (two) times daily. Take with or immediately following a meal.   pravastatin (PRAVACHOL) 20 MG tablet Take 20 mg by mouth every evening.   pregabalin (LYRICA) 75 MG capsule Take 1 capsule (75 mg total) by mouth 2 (two) times daily.   sertraline (ZOLOFT) 50 MG tablet Take 1 tablet (50 mg total) by mouth daily.   sitaGLIPtin (JANUVIA) 50 MG tablet Take 1 tablet (50 mg total) by mouth daily.   zinc oxide 20 % ointment Apply 1 application  topically as needed (To buttocks after every incontinent epsiode and as needed for redness).   No facility-administered encounter medications on file as of 10/01/2023.    Review of Systems  Unable to perform ROS: Dementia    Immunization History  Administered Date(s) Administered   Fluad Quad(high  Dose 65+) 05/03/2022   Influenza Split 03/19/2012, 03/19/2013   Influenza, High Dose Seasonal PF 04/09/2014, 03/22/2015, 03/24/2016, 03/29/2018, 04/16/2020, 05/09/2023   Influenza,inj,Quad PF,6+ Mos 04/09/2014, 03/22/2015, 03/24/2016   Influenza-Unspecified 03/19/2012, 03/19/2013, 04/09/2017, 03/24/2018, 04/16/2020   Moderna Covid-19 Vaccine Bivalent Booster 66yrs & up 05/16/2023   Moderna SARS-COV2 Booster Vaccination 05/16/2022   Moderna Sars-Covid-2 Vaccination 05/19/2021   PFIZER(Purple Top)SARS-COV-2 Vaccination 08/17/2019, 09/10/2019, 04/06/2020, 11/19/2020   Pfizer Covid-19 Vaccine Bivalent Booster 29yrs & up 11/18/2020, 12/21/2021   Pneumococcal Conjugate-13 11/05/2014   Pneumococcal Polysaccharide-23 07/10/2004, 12/12/2010, 05/02/2016   Pneumococcal-Unspecified 07/10/2004, 12/12/2010, 05/02/2016   Respiratory Syncytial Virus Vaccine,Recomb Aduvanted(Arexvy) 06/24/2022   Td 07/10/2009   Tdap 07/06/2022   Zoster Recombinant(Shingrix) 04/09/2017, 07/19/2017   Zoster, Live  07/10/2006   Pertinent  Health Maintenance Due  Topic Date Due   HEMOGLOBIN A1C  12/11/2023   OPHTHALMOLOGY EXAM  04/15/2024   FOOT EXAM  05/13/2024   INFLUENZA VACCINE  Completed   DEXA SCAN  Completed      02/02/2023    2:47 PM 03/14/2023    3:13 PM 04/18/2023    2:05 PM 05/14/2023   12:34 PM 07/19/2023   10:42 AM  Fall Risk  Falls in the past year? 0 0 0 0 0  Was there an injury with Fall? 0 0 0 0 0  Fall Risk Category Calculator 0 0 0 0 0  Patient at Risk for Falls Due to History of fall(s);Impaired balance/gait;Impaired mobility History of fall(s);Impaired balance/gait History of fall(s);Impaired balance/gait;Impaired mobility History of fall(s);Impaired balance/gait;Impaired mobility History of fall(s);Impaired balance/gait;Impaired mobility  Fall risk Follow up Falls evaluation completed;Education provided;Falls prevention discussed Falls evaluation completed;Education provided  Falls evaluation completed;Education provided;Falls prevention discussed Falls evaluation completed;Education provided;Falls prevention discussed   Functional Status Survey:    Vitals:   10/01/23 1012  BP: 116/73  Pulse: 69  Resp: 19  Temp: (!) 97.3 F (36.3 C)  SpO2: 97%  Weight: 170 lb 3.2 oz (77.2 kg)  Height: 5\' 1"  (1.549 m)   Body mass index is 32.16 kg/m. Physical Exam Vitals reviewed.  Constitutional:      General: She is not in acute distress. HENT:     Head: Normocephalic.     Left Ear: There is no impacted cerumen.     Nose: Nose normal.     Mouth/Throat:     Mouth: Mucous membranes are moist.     Dentition: Abnormal dentition.  Eyes:     General:        Right eye: No discharge.        Left eye: No discharge.  Cardiovascular:     Rate and Rhythm: Normal rate and regular rhythm.     Pulses: Normal pulses.     Heart sounds: Normal heart sounds.  Pulmonary:     Effort: Pulmonary effort is normal.     Breath sounds: Normal breath sounds.  Abdominal:     General: Bowel  sounds are normal.     Palpations: Abdomen is soft.  Musculoskeletal:     Cervical back: Neck supple.     Right lower leg: No edema.     Left lower leg: No edema.  Skin:    General: Skin is warm.     Capillary Refill: Capillary refill takes less than 2 seconds.  Neurological:     General: No focal deficit present.     Mental Status: She is alert. Mental status is at baseline.     Motor: Weakness present.  Gait: Gait abnormal.  Psychiatric:        Mood and Affect: Mood normal.     Labs reviewed: Recent Labs    11/23/22 0000 04/30/23 0000 05/03/23 0000  NA 139 137 138  K 4.5 4.1 4.5  CL 107 108 110*  CO2 22 17 16   BUN 21 32* 40*  CREATININE 1.3* 1.6* 1.4*  CALCIUM 8.8 8.5* 8.7   No results for input(s): "AST", "ALT", "ALKPHOS", "BILITOT", "PROT", "ALBUMIN" in the last 8760 hours. Recent Labs    11/23/22 0000 04/30/23 0000 05/03/23 0000  WBC 9.2 14.4 11.8  NEUTROABS 6,017.00 11,160.00 8,803.00  HGB 12.0 11.2* 11.4*  HCT 36 34* 35*  PLT 323 328 402*   Lab Results  Component Value Date   TSH 3.100 08/12/2019   Lab Results  Component Value Date   HGBA1C 7.4 06/12/2023   Lab Results  Component Value Date   CHOL 113 06/12/2023   HDL 38 06/12/2023   LDLCALC 53 06/12/2023   TRIG 135 06/12/2023   CHOLHDL 4.4 12/19/2018    Significant Diagnostic Results in last 30 days:  No results found.  Assessment/Plan 1. Subacute vaginitis (Primary) - improved - vaginal culture grew candida Graylin Shiver - recent gynecology evaluation - now on boric acid vaginal suppository then Estrace  2. Type 2 diabetes mellitus with hyperglycemia, without long-term current use of insulin (HCC) - A1c 7.4 - low dose metformin due to renal decline - recently started Lantus and SSI due to hyperglycemia - sugars 150-235 - discussed low carb/sugar diet  3. Mild dementia with other behavioral disturbance, unspecified dementia type (HCC) - ongoing - sometimes upset/yells at staff -  weight stable - able to perform some ADLs - recently started Namenda  4. Essential hypertension - controlled with lisinopril  5. Hyperlipidemia associated with type 2 diabetes mellitus (HCC) - cont statin  6. Stage 3b chronic kidney disease (HCC) - encourage hydration with water - avoid NSAIDS  7. Peripheral polyneuropathy - improved with Lyrica and low dose gabapentin  8. 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

## 2023-10-29 ENCOUNTER — Encounter: Payer: Self-pay | Admitting: Orthopedic Surgery

## 2023-10-29 ENCOUNTER — Non-Acute Institutional Stay (SKILLED_NURSING_FACILITY): Payer: Self-pay | Admitting: Orthopedic Surgery

## 2023-10-29 DIAGNOSIS — Z794 Long term (current) use of insulin: Secondary | ICD-10-CM

## 2023-10-29 DIAGNOSIS — E785 Hyperlipidemia, unspecified: Secondary | ICD-10-CM

## 2023-10-29 DIAGNOSIS — E1165 Type 2 diabetes mellitus with hyperglycemia: Secondary | ICD-10-CM | POA: Diagnosis not present

## 2023-10-29 DIAGNOSIS — E1169 Type 2 diabetes mellitus with other specified complication: Secondary | ICD-10-CM

## 2023-10-29 DIAGNOSIS — G629 Polyneuropathy, unspecified: Secondary | ICD-10-CM

## 2023-10-29 DIAGNOSIS — F339 Major depressive disorder, recurrent, unspecified: Secondary | ICD-10-CM

## 2023-10-29 DIAGNOSIS — F03B0 Unspecified dementia, moderate, without behavioral disturbance, psychotic disturbance, mood disturbance, and anxiety: Secondary | ICD-10-CM | POA: Diagnosis not present

## 2023-10-29 DIAGNOSIS — N761 Subacute and chronic vaginitis: Secondary | ICD-10-CM

## 2023-10-29 DIAGNOSIS — R635 Abnormal weight gain: Secondary | ICD-10-CM

## 2023-10-29 DIAGNOSIS — N1832 Chronic kidney disease, stage 3b: Secondary | ICD-10-CM

## 2023-10-29 DIAGNOSIS — I10 Essential (primary) hypertension: Secondary | ICD-10-CM

## 2023-10-29 NOTE — Progress Notes (Signed)
 Location:  [facility] Room Number: 26/A Place of Service:  SNF [phone]) Provider:  Arnetha Bhat, NP   Marguerite Shiley, MD  Patient Care Team: Marguerite Shiley, MD as PCP - General (Internal Medicine) Maris Sickle, MD (Ophthalmology) Boyce Byes, MD as Consulting Physician (General Surgery) Emilie Harden, MD as Consulting Physician (Internal Medicine)  Extended Emergency Contact Information Primary Emergency Contact: [name],[name] Address: [address]          [place] [address] [place] Home Phone: [phone] Mobile Phone: [phone] Relation: Spouse  Code Status:  DNR Goals of care: Advanced Directive information    08/13/2023   10:29 AM  Advanced Directives  Does Patient Have a Medical Advance Directive? Yes  Type of Estate agent of [place];Living will;Out of facility DNR (pink MOST or yellow form)  Does patient want to make changes to medical advance directive? No - Patient declined  Copy of Healthcare Power of Attorney in Chart? No - copy requested     Chief Complaint  Patient presents with   Medical Management of Chronic Issues    HPI:  Pt is a 83 y.o. female seen today for medical management of chronic diseases.    She currently resides on the skilled nursing unit at [facility]. PMH: HTN, HLD, GERD, IBS, T2DM, polyneuropathy, DJD, osteopenia, Factor 5 mutation, breast cancer s/p  left lumpectomy 1996, incontinence, and anxiety.    Followed by palliative. Enrolled with hospice in past.    Subacute vaginitis- past urology workup found yeast, recently seen by gynecology, vaginal swab positive for Candida Galabrata> boric acid then Estrace  recommended, reports improved symptoms since starting boric acid, remains on estradiol  suppository 2x/week, gynecology f/u 05/13 T2DM- A1c 7.4 (12/02)> was  8.7 (09/05), urine microalbumin 03/15/2023> normal, no hypoglycemic events, blood sugars 200-300's,  off Invokana  due to frequent genitourinary infections, remains on reduced metformin  due to declining renal function, remains on Lantus and SSI  Dementia-  MMSE 25/30 2021, BIMS score 15/15 (04/03)>was 15/15 (12/27), diagnosed with neurodegenerative disorder per neurology, ambulates with w/c, continues to have behaviors towards staff/husband, remains on Namenda HTN- BUN/creat 40/1.41, GFR 37 05/08/2023, remains on metoprolol  and lisinopril  HLD- total 113, LDL 53 (12/02), on pravastatin  CKD- see above  Peripheral neuropathy- remains on Lyrica  and gabapentin  Depression- Na+ 13 05/08/2023, associated with current health state and living in SNF, remains on Zoloft    Recent blood pressures:  04/15- 114/73  04/08- 146/69  04/01- 119/72  Recent weights:  04/01- 177.5 lbs  03/05- 170.2 lbs  02/01- 168 lbs     Past Medical History:  Diagnosis Date   Allergy    [place]; allergy shots weekly.     Anxiety    Arthritis    DDD lumbar spine.     Cancer (HCC)    breast L x 2; skin basal cell carcinoma Alanda Allegra).   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   Past Surgical History:  Procedure Laterality Date   BREAST LUMPECTOMY  1996   L breast cancer   BREAST SURGERY N/A    Phreesia 12/12/2019   EYE SURGERY N/A    [employer]reesia 12/12/2019   MASTECTOMY  2002   Bilateral for breast cancer   VAGINAL  HYSTERECTOMY  1985   DUB; uterine fibroids; ovaries intact.    Allergies  Allergen Reactions   Fruit & Vegetable Daily [Nutritional Supplements] Diarrhea   Mixed Ragweed    Onion Other (See Comments)    indigestion   Tree Extract Swelling   Sulfa Drugs Cross Reactors Rash    All over the body    Outpatient Encounter Medications as of 10/29/2023  Medication Sig   acetaminophen  (TYLENOL ) 500  MG tablet Take 1,000 mg by mouth every 12 (twelve) hours as needed.   acetaminophen  (TYLENOL ) 500 MG tablet Take 1,000 mg by mouth every evening.   albuterol  (VENTOLIN  HFA) 108 (90 Base) MCG/ACT inhaler Inhale 2 puffs into the lungs every 6 (six) hours as needed for wheezing or shortness of breath.   antiseptic oral rinse (BIOTENE) LIQD 30 mLs by Mouth Rinse route 2 (two) times daily. For dry mouth   aspirin 81 MG chewable tablet Chew by mouth daily.   calcium  carbonate (TUMS) 500 MG chewable tablet Chew 1-2 tablets (200-400 mg of elemental calcium  total) by mouth 3 (three) times daily as needed for indigestion or heartburn.   Estradiol  (IMVEXXY  MAINTENANCE PACK) 4 MCG INST Place 1 suppository vaginally 2 (two) times a week.   Estradiol  Starter Pack (IMVEXXY  STARTER PACK) 4 MCG INST Place 1 suppository vaginally at bedtime.   fluconazole  (DIFLUCAN ) 150 MG tablet Take 150 mg by mouth 2 (two) times a week. Tuesday for redness on vaginal area.   fluticasone  (FLONASE ) 50 MCG/ACT nasal spray Place 1 spray into both nostrils as needed for allergies or rhinitis.   Hydrocortisone  Acetate (ANUSOL -HC RE) Place 1 application  rectally as needed.   insulin aspart (NOVOLOG) 100 UNIT/ML injection Inject 5 Units into the skin 2 (two) times daily. 5 units if CBG more then 200 8 units if CBG more then 300   insulin glargine (LANTUS) 100 UNIT/ML injection Inject 20 Units into the skin every morning.   Ketotifen Fumarate (EYE ITCH RELIEF OP) Place 1 drop into both eyes every 12 (twelve) hours as needed.   lisinopril  (ZESTRIL ) 2.5 MG tablet Take 2.5 mg by mouth in the morning.   loperamide (IMODIUM A-D) 2 MG tablet Take 2 mg by mouth every 12 (twelve) hours as needed for diarrhea or loose stools.   loratadine (CLARITIN) 10 MG tablet Take 10 mg by mouth every morning.   Melatonin 5 MG CAPS Take 1 capsule by mouth daily. As needed for insomnia   memantine (NAMENDA) 5 MG tablet Take 5 mg by mouth 2 (two) times daily.    metFORMIN  (GLUCOPHAGE ) 500 MG tablet Take 250 mg by mouth every evening.   metFORMIN  (GLUCOPHAGE ) 500 MG tablet Take 500 mg by mouth every morning.   metoprolol  succinate (TOPROL -XL) 100 MG 24 hr tablet Take 100 mg by mouth 2 (two) times daily. Take with or immediately following a meal.   pravastatin  (PRAVACHOL ) 20 MG tablet Take 20 mg by mouth every evening.   pregabalin  (LYRICA ) 75 MG capsule Take 1 capsule (75 mg total) by mouth 2 (two) times daily.   sertraline  (ZOLOFT ) 50 MG tablet Take 1 tablet (50 mg total) by mouth daily.   sitaGLIPtin  (JANUVIA ) 50 MG tablet Take 1 tablet (50 mg total) by mouth daily.   zinc oxide 20 % ointment Apply 1 application  topically as needed (To buttocks after every incontinent epsiode and as needed for redness).   No facility-administered encounter medications on file as of 10/29/2023.    Review of Systems  Constitutional:  Negative for fatigue and fever.  HENT:  Negative for sore throat and trouble swallowing.   Eyes:  Negative for visual disturbance.  Respiratory:  Negative for cough and shortness of breath.   Cardiovascular:  Negative for chest pain and leg swelling.  Gastrointestinal:  Negative for abdominal distention and abdominal pain.  Endocrine: Negative for polydipsia, polyphagia and polyuria.  Genitourinary:  Positive for dysuria. Negative for frequency and hematuria.  Musculoskeletal:  Positive for gait problem.  Skin:  Negative for wound.  Neurological:  Positive for weakness. Negative for dizziness and headaches.  Psychiatric/Behavioral:  Positive for dysphoric mood. Negative for confusion and sleep disturbance. The patient is not nervous/anxious.     Immunization History  Administered Date(s) Administered   Fluad Quad(high Dose 65+) 05/03/2022   Influenza Split 03/19/2012, 03/19/2013   Influenza, High Dose Seasonal PF 04/09/2014, 03/22/2015, 03/24/2016, 03/29/2018, 04/16/2020, 05/09/2023   Influenza,inj,Quad PF,6+ Mos 04/09/2014,  03/22/2015, 03/24/2016   Influenza-Unspecified 03/19/2012, 03/19/2013, 04/09/2017, 03/24/2018, 04/16/2020   Moderna Covid-19 Vaccine Bivalent Booster 48yrs & up 05/16/2023   Moderna SARS-COV2 Booster Vaccination 05/16/2022   Moderna Sars-Covid-2 Vaccination 05/19/2021   PFIZER(Purple Top)SARS-COV-2 Vaccination 08/17/2019, 09/10/2019, 04/06/2020, 11/19/2020   Pfizer Covid-19 Vaccine Bivalent Booster 58yrs & up 11/18/2020, 12/21/2021   Pneumococcal Conjugate-13 11/05/2014   Pneumococcal Polysaccharide-23 07/10/2004, 12/12/2010, 05/02/2016   Pneumococcal-Unspecified 07/10/2004, 12/12/2010, 05/02/2016   Respiratory Syncytial Virus Vaccine,Recomb Aduvanted(Arexvy) 06/24/2022   Td 07/10/2009   Tdap 07/06/2022   Zoster Recombinant(Shingrix) 04/09/2017, 07/19/2017   Zoster, Live 07/10/2006   Pertinent  Health Maintenance Due  Topic Date Due   HEMOGLOBIN A1C  12/11/2023   INFLUENZA VACCINE  02/08/2024   OPHTHALMOLOGY EXAM  04/15/2024   FOOT EXAM  05/13/2024   DEXA SCAN  Completed      02/02/2023    2:47 PM 03/14/2023    3:13 PM 04/18/2023    2:05 PM 05/14/2023   12:34 PM 07/19/2023   10:42 AM  Fall Risk  Falls in the past year? 0 0 0 0 0  Was there an injury with Fall? 0 0 0 0 0  Fall Risk Category Calculator 0 0 0 0 0  Patient at Risk for Falls Due to History of fall(s);Impaired balance/gait;Impaired mobility History of fall(s);Impaired balance/gait History of fall(s);Impaired balance/gait;Impaired mobility History of fall(s);Impaired balance/gait;Impaired mobility History of fall(s);Impaired balance/gait;Impaired mobility  Fall risk Follow up Falls evaluation completed;Education provided;Falls prevention discussed Falls evaluation completed;Education provided  Falls evaluation completed;Education provided;Falls prevention discussed Falls evaluation completed;Education provided;Falls prevention discussed   Functional Status Survey:    Vitals:   10/29/23 1606  BP: 114/73  Pulse: 70   Resp: 14  Temp: 97.7 F (36.5 C)  SpO2: 96%  Weight: 170 lb 3.2 oz (77.2 kg)  Height: 5\' 1"  (1.549 m)   Body mass index is 32.16 kg/m. Physical Exam Vitals reviewed.  Constitutional:      General: She is not in acute distress. HENT:     Head: Normocephalic.  Eyes:     General:        Right eye: No discharge.        Left eye: No discharge.  Cardiovascular:     Rate and Rhythm: Normal rate and regular rhythm.     Pulses: Normal pulses.     Heart sounds: Normal heart sounds.  Pulmonary:     Effort: Pulmonary effort is normal.     Breath sounds: Normal breath sounds.  Abdominal:     General: Bowel sounds are normal. There  is no distension.     Palpations: Abdomen is soft.     Tenderness: There is no abdominal tenderness.  Musculoskeletal:     Cervical back: Neck supple.     Right lower leg: No edema.     Left lower leg: No edema.  Skin:    General: Skin is warm.     Capillary Refill: Capillary refill takes less than 2 seconds.  Neurological:     General: No focal deficit present.     Mental Status: She is alert and oriented to person, place, and time.     Motor: Weakness present.     Gait: Gait abnormal.  Psychiatric:        Mood and Affect: Mood normal.     Labs reviewed: Recent Labs    11/23/22 0000 04/30/23 0000 05/03/23 0000  NA 139 137 138  K 4.5 4.1 4.5  CL 107 108 110*  CO2 22 17 16   BUN 21 32* 40*  CREATININE 1.3* 1.6* 1.4*  CALCIUM  8.8 8.5* 8.7   No results for input(s): "AST", "ALT", "ALKPHOS", "BILITOT", "PROT", "ALBUMIN" in the last 8760 hours. Recent Labs    11/23/22 0000 04/30/23 0000 05/03/23 0000  WBC 9.2 14.4 11.8  NEUTROABS 6,017.00 11,160.00 8,803.00  HGB 12.0 11.2* 11.4*  HCT 36 34* 35*  PLT 323 328 402*   Lab Results  Component Value Date   TSH 3.100 08/12/2019   Lab Results  Component Value Date   HGBA1C 7.4 06/12/2023   Lab Results  Component Value Date   CHOL 113 06/12/2023   HDL 38 06/12/2023   LDLCALC 53  06/12/2023   TRIG 135 06/12/2023   CHOLHDL 4.4 12/19/2018    Significant Diagnostic Results in last 30 days:  No results found.  Assessment/Plan 1. Weight gain (Primary) - gained 9 lbs within 2 months - TSH in future  2. Subacute vaginitis - followed by gynecology - completed boric acid - cont estradiol  suppository 2x/week  3. Type 2 diabetes mellitus with hyperglycemia, with long-term current use of insulin (HCC) - A1c 7.4 - no hypoglycemia, sugars 200-300's - cont Dexcon - cont Lantus and SSI - repeat A1c 04/24  4. 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 Essential hypertension - controlled with metoprolol  and lisinopril   6. Hyperlipidemia associated with type 2 diabetes mellitus (HCC) - LDL stable - cont pravastatin   7. Stage 3b chronic kidney disease (HCC) - encourage hydration with water - avoid NSAIDS  8. Peripheral polyneuropathy - stable with Lyrica  and gabapentin   9. Recurrent depression (HCC) - no mood changes - refused Zoloft  increase in past - cont Zoloft     Family/ staff Communication: plan discussed with patient and nurse  Labs/tests ordered:  cbc/diff, cmp, TSH, A1c 11/01/2023

## 2023-10-30 ENCOUNTER — Encounter: Payer: Self-pay | Admitting: Internal Medicine

## 2023-10-30 ENCOUNTER — Non-Acute Institutional Stay (SKILLED_NURSING_FACILITY): Payer: Self-pay | Admitting: Adult Health

## 2023-10-30 ENCOUNTER — Encounter: Payer: Self-pay | Admitting: Adult Health

## 2023-10-30 DIAGNOSIS — F5101 Primary insomnia: Secondary | ICD-10-CM | POA: Diagnosis not present

## 2023-10-30 DIAGNOSIS — G629 Polyneuropathy, unspecified: Secondary | ICD-10-CM

## 2023-10-30 DIAGNOSIS — Z794 Long term (current) use of insulin: Secondary | ICD-10-CM

## 2023-10-30 DIAGNOSIS — E1122 Type 2 diabetes mellitus with diabetic chronic kidney disease: Secondary | ICD-10-CM | POA: Diagnosis not present

## 2023-10-30 DIAGNOSIS — I10 Essential (primary) hypertension: Secondary | ICD-10-CM

## 2023-10-30 DIAGNOSIS — N1832 Chronic kidney disease, stage 3b: Secondary | ICD-10-CM

## 2023-10-30 NOTE — Progress Notes (Signed)
 Location:  [facility] Room Number: 26 [id number] Place of Service:  SNF (31) Provider:  Medina-Vargas, Nyasiah Moffet, DNP, FNP-BC  Patient Care Team: Marguerite Shiley, MD as PCP - General (Internal Medicine) Maris Sickle, MD (Ophthalmology) Boyce Byes, MD as Consulting Physician (General Surgery) Emilie Harden, MD as Consulting Physician (Internal Medicine)  Extended Emergency Contact Information Primary Emergency Contact: [name],[name] Address: [address]          [place] [address] [place] Home Phone: [phone] Mobile Phone: [phone] Relation: Spouse  Code Status:   DNR  Goals of care: Advanced Directive information    08/13/2023   10:29 AM  Advanced Directives  Does Patient Have a Medical Advance Directive? Yes  Type of Estate agent of [place];Living will;Out of facility DNR (pink MOST or yellow form)  Does patient want to make changes to medical advance directive? No - Patient declined  Copy of Healthcare Power of Attorney in Chart? No - copy requested     Chief Complaint  Patient presents with   Acute Visit    Trouble sleeping    HPI:  Pt is a 83 y.o. female seen today for an acute visit regarding "trouble sleeping".  She is a resident of [id number].  She she was reported to have trouble sleeping.  She was previously on scheduled melatonin for 14 days which is now expired.  She is requesting for it to be re-instated  Type 2 diabetes mellitus with stage 3b chronic kidney disease, with long-term current use of insulin (HCC)  -   A1C 7.4, takes Lantus 30 units daily, NovoLog sliding scale with meals, Januvia  50 mg daily  Peripheral polyneuropathy -   takes gabapentin  200 mg at bedtime and Lyrica  75 mg twice a day  Primary hypertension -   BP 140/73, takes lisinopril  2.5 mg daily and metoprolol  succinate 100 mg twice a day    Past Medical History:  Diagnosis Date   Allergy    Seabron [place];  allergy shots weekly.     Anxiety    Arthritis    DDD lumbar spine.     Cancer (HCC)    breast L x 2; skin basal cell carcinoma Alanda Allegra).   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   Past Surgical History:  Procedure Laterality Date   BREAST LUMPECTOMY  1996   L breast cancer   BREAST SURGERY N/A    Phreesia 12/12/2019   EYE SURGERY N/A    Phreesia 12/12/2019   MASTECTOMY  2002   Bilateral for breast cancer   VAGINAL HYSTERECTOMY  1985   DUB; uterine fibroids; ovaries intact.    Allergies  Allergen Reactions   Fruit & Vegetable Daily [Nutritional Supplements] Diarrhea   Mixed Ragweed    Onion Other (See Comments)    indigestion   Tree Extract Swelling   Sulfa Drugs Cross Reactors Rash    All over the body    Outpatient Encounter Medications as of 10/30/2023  Medication Sig   acetaminophen  (TYLENOL ) 500 MG tablet Take 1,000 mg by mouth every 12 (twelve) hours as needed.   acetaminophen  (TYLENOL ) 500 MG tablet Take 1,000 mg by mouth every evening.   albuterol  (VENTOLIN  HFA) 108 (90 Base)  MCG/ACT inhaler Inhale 2 puffs into the lungs every 6 (six) hours as needed for wheezing or shortness of breath.   antiseptic oral rinse (BIOTENE) LIQD 30 mLs by Mouth Rinse route 2 (two) times daily. For dry mouth   aspirin 81 MG chewable tablet Chew by mouth daily.   calcium  carbonate (TUMS) 500 MG chewable tablet Chew 1-2 tablets (200-400 mg of elemental calcium  total) by mouth 3 (three) times daily as needed for indigestion or heartburn.   Estradiol  (IMVEXXY  MAINTENANCE PACK) 4 MCG INST Place 1 suppository vaginally 2 (two) times a week.   Estradiol  Starter Pack (IMVEXXY  STARTER PACK) 4 MCG INST Place 1 suppository vaginally at bedtime.   fluconazole  (DIFLUCAN ) 150 MG tablet Take 150 mg by  mouth 2 (two) times a week. Tuesday for redness on vaginal area.   fluticasone  (FLONASE ) 50 MCG/ACT nasal spray Place 1 spray into both nostrils as needed for allergies or rhinitis.   Hydrocortisone  Acetate (ANUSOL -HC RE) Place 1 application  rectally as needed.   insulin aspart (NOVOLOG) 100 UNIT/ML injection Inject 5 Units into the skin 2 (two) times daily. 8 units if CBG more then 200 12 units if CBG more then 300   insulin glargine (LANTUS) 100 UNIT/ML injection Inject 30 Units into the skin every morning.   Ketotifen Fumarate (EYE ITCH RELIEF OP) Place 1 drop into both eyes every 12 (twelve) hours as needed.   lisinopril  (ZESTRIL ) 2.5 MG tablet Take 2.5 mg by mouth in the morning.   loperamide (IMODIUM A-D) 2 MG tablet Take 2 mg by mouth every 12 (twelve) hours as needed for diarrhea or loose stools.   loratadine (CLARITIN) 10 MG tablet Take 10 mg by mouth every morning.   Melatonin 5 MG CAPS Take 1 capsule by mouth daily. As needed for insomnia   memantine (NAMENDA) 5 MG tablet Take 5 mg by mouth 2 (two) times daily.   metFORMIN  (GLUCOPHAGE ) 500 MG tablet Take 250 mg by mouth every evening.   metFORMIN  (GLUCOPHAGE ) 500 MG tablet Take 500 mg by mouth every morning.   metoprolol  succinate (TOPROL -XL) 100 MG 24 hr tablet Take 100 mg by mouth 2 (two) times daily. Take with or immediately following a meal.   pravastatin  (PRAVACHOL ) 20 MG tablet Take 20 mg by mouth every evening.   pregabalin  (LYRICA ) 75 MG capsule Take 1 capsule (75 mg total) by mouth 2 (two) times daily.   sertraline  (ZOLOFT ) 50 MG tablet Take 1 tablet (50 mg total) by mouth daily.   sitaGLIPtin  (JANUVIA ) 50 MG tablet Take 1 tablet (50 mg total) by mouth daily.   zinc oxide 20 % ointment Apply 1 application  topically as needed (To buttocks after every incontinent epsiode and as needed for redness).   No facility-administered encounter medications on file as of 10/30/2023.    Review of Systems  Constitutional:  Negative for  appetite change, chills, fatigue and fever.  HENT:  Negative for congestion, hearing loss, rhinorrhea and sore throat.   Eyes: Negative.   Respiratory:  Negative for cough, shortness of breath and wheezing.   Cardiovascular:  Negative for chest pain, palpitations and leg swelling.  Gastrointestinal:  Negative for abdominal pain, constipation, diarrhea, nausea and vomiting.  Genitourinary:  Negative for dysuria.  Musculoskeletal:  Negative for arthralgias, back pain and myalgias.  Skin:  Negative for color change, rash and wound.  Neurological:  Negative for dizziness, weakness and headaches.  Psychiatric/Behavioral:  Positive for sleep disturbance. Negative for behavioral problems. The patient is  not nervous/anxious.       Immunization History  Administered Date(s) Administered   Fluad Quad(high Dose 65+) 05/03/2022   Influenza Split 03/19/2012, 03/19/2013   Influenza, High Dose Seasonal PF 04/09/2014, 03/22/2015, 03/24/2016, 03/29/2018, 04/16/2020, 05/09/2023   Influenza,inj,Quad PF,6+ Mos 04/09/2014, 03/22/2015, 03/24/2016   Influenza-Unspecified 03/19/2012, 03/19/2013, 04/09/2017, 03/24/2018, 04/16/2020   Moderna Covid-19 Vaccine Bivalent Booster 75yrs & up 05/16/2023   Moderna SARS-COV2 Booster Vaccination 05/16/2022   Moderna Sars-Covid-2 Vaccination 05/19/2021   PFIZER(Purple Top)SARS-COV-2 Vaccination 08/17/2019, 09/10/2019, 04/06/2020, 11/19/2020   Pfizer Covid-19 Vaccine Bivalent Booster 38yrs & up 11/18/2020, 12/21/2021   Pneumococcal Conjugate-13 11/05/2014   Pneumococcal Polysaccharide-23 07/10/2004, 12/12/2010, 05/02/2016   Pneumococcal-Unspecified 07/10/2004, 12/12/2010, 05/02/2016   Respiratory Syncytial Virus Vaccine,Recomb Aduvanted(Arexvy) 06/24/2022   Td 07/10/2009   Tdap 07/06/2022   Zoster Recombinant(Shingrix) 04/09/2017, 07/19/2017   Zoster, Live 07/10/2006   Pertinent  Health Maintenance Due  Topic Date Due   HEMOGLOBIN A1C  12/11/2023   INFLUENZA VACCINE   02/08/2024   OPHTHALMOLOGY EXAM  04/15/2024   FOOT EXAM  05/13/2024   DEXA SCAN  Completed      02/02/2023    2:47 PM 03/14/2023    3:13 PM 04/18/2023    2:05 PM 05/14/2023   12:34 PM 07/19/2023   10:42 AM  Fall Risk  Falls in the past year? 0 0 0 0 0  Was there an injury with Fall? 0 0 0 0 0  Fall Risk Category Calculator 0 0 0 0 0  Patient at Risk for Falls Due to History of fall(s);Impaired balance/gait;Impaired mobility History of fall(s);Impaired balance/gait History of fall(s);Impaired balance/gait;Impaired mobility History of fall(s);Impaired balance/gait;Impaired mobility History of fall(s);Impaired balance/gait;Impaired mobility  Fall risk Follow up Falls evaluation completed;Education provided;Falls prevention discussed Falls evaluation completed;Education provided  Falls evaluation completed;Education provided;Falls prevention discussed Falls evaluation completed;Education provided;Falls prevention discussed     Vitals:   10/30/23 1635  BP: 114/73  Pulse: 70  Resp: 16  Temp: 97.7 F (36.5 C)  SpO2: 96%  Weight: 170 lb 3.2 oz (77.2 kg)  Height: 5\' 4"  (1.626 m)   Body mass index is 29.21 kg/m.  Physical Exam Constitutional:      Appearance: Normal appearance.  HENT:     Head: Normocephalic and atraumatic.     Nose: Nose normal.     Mouth/Throat:     Mouth: Mucous membranes are moist.  Eyes:     Conjunctiva/sclera: Conjunctivae normal.  Cardiovascular:     Rate and Rhythm: Normal rate and regular rhythm.  Pulmonary:     Effort: Pulmonary effort is normal.     Breath sounds: Normal breath sounds.  Abdominal:     General: Bowel sounds are normal.     Palpations: Abdomen is soft.  Musculoskeletal:        General: Normal range of motion.     Cervical back: Normal range of motion.  Skin:    General: Skin is warm and dry.  Neurological:     Mental Status: She is alert.  Psychiatric:        Mood and Affect: Mood normal.        Behavior: Behavior normal.       Labs reviewed: Recent Labs    11/23/22 0000 04/30/23 0000 05/03/23 0000  NA 139 137 138  K 4.5 4.1 4.5  CL 107 108 110*  CO2 22 17 16   BUN 21 32* 40*  CREATININE 1.3* 1.6* 1.4*  CALCIUM  8.8 8.5* 8.7   No results for  input(s): "AST", "ALT", "ALKPHOS", "BILITOT", "PROT", "ALBUMIN" in the last 8760 hours. Recent Labs    11/23/22 0000 04/30/23 0000 05/03/23 0000  WBC 9.2 14.4 11.8  NEUTROABS 6,017.00 11,160.00 8,803.00  HGB 12.0 11.2* 11.4*  HCT 36 34* 35*  PLT 323 328 402*   Lab Results  Component Value Date   TSH 3.100 08/12/2019   Lab Results  Component Value Date   HGBA1C 7.4 06/12/2023   Lab Results  Component Value Date   CHOL 113 06/12/2023   HDL 38 06/12/2023   LDLCALC 53 06/12/2023   TRIG 135 06/12/2023   CHOLHDL 4.4 12/19/2018    Significant Diagnostic Results in last 30 days:  No results found.  Assessment/Plan  1. Primary insomnia (Primary) -  Has difficulty sleeping at night -   Will start on melatonin 5 mg at bedtime  2. Type 2 diabetes mellitus with stage 3b chronic kidney disease, with long-term current use of insulin (HCC) Lab Results  Component Value Date   HGBA1C 7.4 06/12/2023    -   Stable -   Continue Lantus, NovoLog and Januvia  - Monitor CBGs  3. Peripheral polyneuropathy -   Stable -   Continue gabapentin  and Lyrica   4. Primary hypertension -  BP stable - Continue lisinopril  and metoprolol  succinate      Family/ staff Communication: Discussed plan of care with resident and charge nurse.  Labs/tests ordered: None    Natasha Paulson Medina-Vargas, DNP, MSN, FNP-BC [facility] [phone] (Monday-Friday 8:00 a.m. - 5:00 p.m.) [phone] (after hours)

## 2023-11-01 ENCOUNTER — Non-Acute Institutional Stay (SKILLED_NURSING_FACILITY): Payer: Self-pay | Admitting: Internal Medicine

## 2023-11-01 DIAGNOSIS — G629 Polyneuropathy, unspecified: Secondary | ICD-10-CM

## 2023-11-01 DIAGNOSIS — E1122 Type 2 diabetes mellitus with diabetic chronic kidney disease: Secondary | ICD-10-CM

## 2023-11-01 DIAGNOSIS — F03B0 Unspecified dementia, moderate, without behavioral disturbance, psychotic disturbance, mood disturbance, and anxiety: Secondary | ICD-10-CM

## 2023-11-01 DIAGNOSIS — N761 Subacute and chronic vaginitis: Secondary | ICD-10-CM | POA: Diagnosis not present

## 2023-11-01 DIAGNOSIS — Z794 Long term (current) use of insulin: Secondary | ICD-10-CM

## 2023-11-01 DIAGNOSIS — N1832 Chronic kidney disease, stage 3b: Secondary | ICD-10-CM

## 2023-11-01 DIAGNOSIS — F339 Major depressive disorder, recurrent, unspecified: Secondary | ICD-10-CM

## 2023-11-02 ENCOUNTER — Encounter: Payer: Self-pay | Admitting: Internal Medicine

## 2023-11-02 NOTE — Progress Notes (Signed)
 [facility] Service:  SNF (31)  Provider:   Code Status: DNR Goals of Care:     08/13/2023   10:29 AM  Advanced Directives  Does Patient Have a Medical Advance Directive? Yes  Type of Estate agent of [place];Living will;Out of facility DNR (pink MOST or yellow form)  Does patient want to make changes to medical advance directive? No - Patient declined  Copy of Healthcare Power of Attorney in Chart? No - copy requested     Chief Complaint  Patient presents with   Acute Visit    HPI: Patient is a 83 y.o. female seen today for an acute visit for Eval for Cognition and Follow up of her Diabetes  Lives in SNF in [facility]   Patient has a history of type 2 diabetes Now on Insulin A1C 8.9 on 4/25 CBGS running more then 200 in am and sometimes 300  Cognitive decline History of neurodegenerative dementia  Patient has a known history of dementia.  Was seen by And neurology in March 2021 MRI at that time showed moderate atrophy and chronic microvascular ischemia Patient did not follow-up with them.  Also did not start Namenda as prescribed. Patient continues to need help with transfers with 2 assist.  She is unable to walk.  Stays in her wheelchair. Her husband who has healthcare power of attorney is planning to take over her estate   I did discuss this with her today and patient is reluctant right now She also wants me to talk to her son She right now is refusing to go to Neurologist  Chronic Vaginitis Seen By GYN  Swab positive for Candida Glabrata recommended Vaginal Boric acid followed By Estrace  She is now also on Lyrica  and Low dose of Neurontin  to help with Neuropathy   Urinary Incontinence   CT abdomen/pelvis negative for hydronephrosis, renal calculi, neoplasm  Past Medical History:  Diagnosis Date   Allergy    Seabron [place]; allergy shots weekly.     Anxiety    Arthritis    DDD lumbar spine.     Cancer (HCC)    breast  L x 2; skin basal cell carcinoma Alanda Allegra).   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    Past Surgical History:  Procedure Laterality Date   BREAST LUMPECTOMY  1996   L breast cancer   BREAST SURGERY N/A    Phreesia 12/12/2019   EYE SURGERY N/A    Phreesia 12/12/2019   MASTECTOMY  2002   Bilateral for breast cancer   VAGINAL HYSTERECTOMY  1985   DUB; uterine fibroids; ovaries intact.    Allergies  Allergen Reactions   Fruit & Vegetable Daily [Nutritional Supplements] Diarrhea   Mixed Ragweed    Onion Other (See Comments)    indigestion   Tree Extract Swelling   Sulfa Drugs Cross Reactors Rash    All over the body    Outpatient Encounter Medications as of 11/01/2023  Medication Sig   gabapentin  (NEURONTIN ) 100 MG capsule Take 200 mg by mouth at bedtime.   acetaminophen  (TYLENOL ) 500 MG tablet Take 1,000 mg by mouth every 12 (twelve) hours as needed.   acetaminophen  (TYLENOL ) 500 MG tablet Take 1,000 mg by mouth every  evening.   albuterol  (VENTOLIN  HFA) 108 (90 Base) MCG/ACT inhaler Inhale 2 puffs into the lungs every 6 (six) hours as needed for wheezing or shortness of breath.   antiseptic oral rinse (BIOTENE) LIQD 30 mLs by Mouth Rinse route 2 (two) times daily. For dry mouth   aspirin 81 MG chewable tablet Chew by mouth daily.   calcium  carbonate (TUMS) 500 MG chewable tablet Chew 1-2 tablets (200-400 mg of elemental calcium  total) by mouth 3 (three) times daily as needed for indigestion or heartburn.   Estradiol  (IMVEXXY  MAINTENANCE PACK) 4 MCG INST Place 1 suppository vaginally 2 (two) times a week.   Estradiol  Starter Pack (IMVEXXY  STARTER PACK) 4 MCG INST Place 1 suppository vaginally at bedtime.   fluconazole  (DIFLUCAN ) 150 MG tablet Take 150 mg by mouth 2 (two) times a  week. Tuesday for redness on vaginal area.   fluticasone  (FLONASE ) 50 MCG/ACT nasal spray Place 1 spray into both nostrils as needed for allergies or rhinitis.   Hydrocortisone  Acetate (ANUSOL -HC RE) Place 1 application  rectally as needed.   insulin aspart (NOVOLOG) 100 UNIT/ML injection Inject 5 Units into the skin 2 (two) times daily. 8 units if CBG more then 200 12 units if CBG more then 300   insulin glargine (LANTUS) 100 UNIT/ML injection Inject 35 Units into the skin every morning.   Ketotifen Fumarate (EYE ITCH RELIEF OP) Place 1 drop into both eyes every 12 (twelve) hours as needed.   lisinopril  (ZESTRIL ) 2.5 MG tablet Take 2.5 mg by mouth in the morning.   loperamide (IMODIUM A-D) 2 MG tablet Take 2 mg by mouth every 12 (twelve) hours as needed for diarrhea or loose stools.   loratadine (CLARITIN) 10 MG tablet Take 10 mg by mouth every morning.   Melatonin 5 MG CAPS Take 1 capsule by mouth daily. As needed for insomnia   memantine (NAMENDA) 5 MG tablet Take 10 mg by mouth at bedtime.   metFORMIN  (GLUCOPHAGE ) 500 MG tablet Take 250 mg by mouth every evening.   metFORMIN  (GLUCOPHAGE ) 500 MG tablet Take 500 mg by mouth every morning.   metoprolol  succinate (TOPROL -XL) 100 MG 24 hr tablet Take 100 mg by mouth 2 (two) times daily. Take with or immediately following a meal.   pravastatin  (PRAVACHOL ) 20 MG tablet Take 20 mg by mouth every evening.   pregabalin  (LYRICA ) 75 MG capsule Take 1 capsule (75 mg total) by mouth 2 (two) times daily.   sertraline  (ZOLOFT ) 50 MG tablet Take 1 tablet (50 mg total) by mouth daily.   sitaGLIPtin  (JANUVIA ) 50 MG tablet Take 1 tablet (50 mg total) by mouth daily.   zinc oxide 20 % ointment Apply 1 application  topically as needed (To buttocks after every incontinent epsiode and as needed for redness).   No facility-administered encounter medications on file as of 11/01/2023.    Review of Systems:  Review of Systems  Constitutional:  Negative for activity  change and appetite change.  HENT: Negative.    Respiratory:  Negative for cough and shortness of breath.   Cardiovascular:  Negative for leg swelling.  Gastrointestinal:  Negative for constipation.  Genitourinary:  Positive for dysuria.  Musculoskeletal:  Positive for gait problem. Negative for arthralgias and myalgias.  Skin: Negative.   Neurological:  Positive for weakness. Negative for dizziness.  Psychiatric/Behavioral:  Positive for confusion. Negative for dysphoric mood and sleep disturbance.     Health Maintenance  Topic Date Due   Diabetic kidney evaluation - Urine ACR  05/17/2019   COVID-19 Vaccine (9 - 2024-25 season) 07/11/2023   HEMOGLOBIN A1C  12/11/2023   Medicare Annual Wellness (AWV)  02/02/2024   INFLUENZA VACCINE  02/08/2024   OPHTHALMOLOGY EXAM  04/15/2024   Diabetic kidney evaluation - eGFR measurement  05/02/2024   FOOT EXAM  05/13/2024   DTaP/Tdap/Td (3 - Td or Tdap) 07/06/2032   Pneumonia Vaccine 68+ Years old  Completed   DEXA SCAN  Completed   Zoster Vaccines- Shingrix  Completed   HPV VACCINES  Aged Out   Meningococcal B Vaccine  Aged Out    Physical Exam: Vitals:   11/01/23 0907  BP: 112/66  Pulse: 72  Resp: 19  Temp: (!) 97.2 F (36.2 C)  Weight: 170 lb (77.1 kg)   Body mass index is 29.18 kg/m. Physical Exam Vitals reviewed.  Constitutional:      Appearance: Normal appearance.  HENT:     Head: Normocephalic.     Nose: Nose normal.     Mouth/Throat:     Mouth: Mucous membranes are moist.     Pharynx: Oropharynx is clear.  Eyes:     Pupils: Pupils are equal, round, and reactive to light.  Cardiovascular:     Rate and Rhythm: Normal rate and regular rhythm.     Pulses: Normal pulses.     Heart sounds: Normal heart sounds. No murmur heard. Pulmonary:     Effort: Pulmonary effort is normal.     Breath sounds: Normal breath sounds.  Abdominal:     General: Abdomen is flat. Bowel sounds are normal.     Palpations: Abdomen is soft.   Musculoskeletal:        General: No swelling.     Cervical back: Neck supple.  Skin:    General: Skin is warm.  Neurological:     General: No focal deficit present.     Mental Status: She is alert.  Psychiatric:        Mood and Affect: Mood normal.        Thought Content: Thought content normal.     Labs reviewed: Basic Metabolic Panel: Recent Labs    11/23/22 0000 04/30/23 0000 05/03/23 0000  NA 139 137 138  K 4.5 4.1 4.5  CL 107 108 110*  CO2 22 17 16   BUN 21 32* 40*  CREATININE 1.3* 1.6* 1.4*  CALCIUM  8.8 8.5* 8.7   Liver Function Tests: No results for input(s): "AST", "ALT", "ALKPHOS", "BILITOT", "PROT", "ALBUMIN" in the last 8760 hours. No results for input(s): "LIPASE", "AMYLASE" in the last 8760 hours. No results for input(s): "AMMONIA" in the last 8760 hours. CBC: Recent Labs    11/23/22 0000 04/30/23 0000 05/03/23 0000  WBC 9.2 14.4 11.8  NEUTROABS 6,017.00 11,160.00 8,803.00  HGB 12.0 11.2* 11.4*  HCT 36 34* 35*  PLT 323 328 402*   Lipid Panel: Recent Labs    06/12/23 0000  CHOL 113  HDL 38  LDLCALC 53  TRIG 135   Lab Results  Component Value Date   HGBA1C 7.4 06/12/2023    Procedures since last visit: No results found.  Assessment/Plan 1. Type 2 diabetes mellitus with stage 3b chronic kidney disease, with long-term current use of insulin (HCC) (Primary) A1C is elevated now 8.9 Will Change the Insulin to 35 units Continue Sliding scale coverage Will follow with A1C in 3 months  2. Neurodegenerative Dementia Patient is refusing to go back to neurology. She also wanted me to talk to Reymundo Caulk her son from  first marriage which I did. He wants to get more information for me and we will connect next week. I have also made referral to speech therapy for a detailed cognitive evaluation Just by discussing with the staff and the social worker patient lacks skills to make higher level of decisions for herself. Will continue to follow She has  finally agreed to continue Namenda at night not during day time  3. Peripheral polyneuropathy Will Continue Gabapentin  and Lyrica  for now  4. Subacute vaginitis Seeing Gyn. Estrace  Chronic Diflucan   5. Recurrent depression (HCC) Zoloft     Labs/tests ordered:  * No order type specified * Next appt:  Visit date not found

## 2023-11-05 ENCOUNTER — Other Ambulatory Visit: Payer: Self-pay | Admitting: Orthopedic Surgery

## 2023-11-05 DIAGNOSIS — G629 Polyneuropathy, unspecified: Secondary | ICD-10-CM

## 2023-11-05 DIAGNOSIS — N761 Subacute and chronic vaginitis: Secondary | ICD-10-CM

## 2023-11-05 MED ORDER — PREGABALIN 75 MG PO CAPS: 75.0000 mg | ORAL_CAPSULE | Freq: Two times a day (BID) | ORAL | 0 refills | Status: DC

## 2023-11-07 ENCOUNTER — Other Ambulatory Visit: Payer: Self-pay | Admitting: Orthopedic Surgery

## 2023-11-07 DIAGNOSIS — N761 Subacute and chronic vaginitis: Secondary | ICD-10-CM

## 2023-11-07 DIAGNOSIS — G629 Polyneuropathy, unspecified: Secondary | ICD-10-CM

## 2023-11-07 MED ORDER — PREGABALIN 75 MG PO CAPS: 75.0000 mg | ORAL_CAPSULE | Freq: Two times a day (BID) | ORAL | 3 refills | Status: DC

## 2023-11-08 ENCOUNTER — Encounter: Payer: Self-pay | Admitting: Internal Medicine

## 2023-11-08 ENCOUNTER — Non-Acute Institutional Stay (SKILLED_NURSING_FACILITY): Payer: Self-pay | Admitting: Internal Medicine

## 2023-11-08 DIAGNOSIS — F03A0 Unspecified dementia, mild, without behavioral disturbance, psychotic disturbance, mood disturbance, and anxiety: Secondary | ICD-10-CM | POA: Diagnosis not present

## 2023-11-08 DIAGNOSIS — Z794 Long term (current) use of insulin: Secondary | ICD-10-CM | POA: Diagnosis not present

## 2023-11-08 DIAGNOSIS — N1832 Chronic kidney disease, stage 3b: Secondary | ICD-10-CM | POA: Diagnosis not present

## 2023-11-08 DIAGNOSIS — E1122 Type 2 diabetes mellitus with diabetic chronic kidney disease: Secondary | ICD-10-CM | POA: Diagnosis not present

## 2023-11-08 NOTE — Progress Notes (Signed)
 Location:  [facility] Room Number: 26 A Place of Service:  SNF [phone]) Provider:  Marguerite Shiley, MD   Marguerite Shiley, MD  Patient Care Team: Marguerite Shiley, MD as PCP - General (Internal Medicine) Maris Sickle, MD (Ophthalmology) Boyce Byes, MD as Consulting Physician (General Surgery) Emilie Harden, MD as Consulting Physician (Internal Medicine)  Extended Emergency Contact Information Primary Emergency Contact: [name],[name] Address: [address]          [place] [address] [place] Home Phone: [phone] Mobile Phone: [phone] Relation: Spouse  Code Status: DNR Palliative Care Goals of care: Advanced Directive information    08/13/2023   10:29 AM  Advanced Directives  Does Patient Have a Medical Advance Directive? Yes  Type of Estate agent of [place];Living will;Out of facility DNR (pink MOST or yellow form)  Does patient want to make changes to medical advance directive? No - Patient declined  Copy of Healthcare Power of Attorney in Chart? No - copy requested     Chief Complaint  Patient presents with   routine visit    HPI:  Pt is a 83 y.o. female seen today for medical management of chronic diseases.    Lives in SNF in [facility]   Patient has a history of type 2 diabetes Now on Insulin A1C 8.9 on 4/25  CBGs still running 190-200 Some around 140  Cognitive decline History of neurodegenerative dementia  Patient has a known history of dementia.  Was seen by And neurology in March 2021 MRI at that time showed moderate atrophy and chronic microvascular ischemia Patient did not follow-up with them.  Also did not start Namenda as prescribed. Patient continues to need help with transfers with 2 assist.  She is unable to walk.  Stays in her wheelchair. Her husband who has healthcare power of attorney is planning to take over her estate     I had Speech evaluate her and she scored 22/30 in  Slums which is Mild Cognitive disorder Patient is adamant she would be able to go back to her house which is not possible As d/w there son today Reymundo Caulk Her husband who is wants to sell the house so he can pay [facility] The house is under her Name Chronic Vaginitis Seen By GYN  Swab positive for Candida Glabrata recommended Vaginal Boric acid followed By Estrace  She is now also on Lyrica  and Low dose of Neurontin  to help with Neuropathy    Urinary Incontinence   CT abdomen/pelvis negative for hydronephrosis, renal calculi, neoplasm    Past Medical History:  Diagnosis Date   Allergy    Seabron Cypress; allergy shots weekly.     Anxiety    Arthritis    DDD lumbar spine.     Cancer (HCC)    breast L x 2; skin basal cell carcinoma Alanda Allegra).   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   Past Surgical History:  Procedure Laterality Date   BREAST LUMPECTOMY  1996   L breast cancer   BREAST SURGERY N/A    Phreesia 12/12/2019   EYE SURGERY N/A    Phreesia 12/12/2019   MASTECTOMY  2002   Bilateral for breast cancer   VAGINAL  HYSTERECTOMY  1985   DUB; uterine fibroids; ovaries intact.    Allergies  Allergen Reactions   Fruit & Vegetable Daily [Nutritional Supplements] Diarrhea   Mixed Ragweed    Onion Other (See Comments)    indigestion   Tree Extract Swelling   Sulfa Drugs Cross Reactors Rash    All over the body    Outpatient Encounter Medications as of 11/08/2023  Medication Sig   acetaminophen  (TYLENOL ) 500 MG tablet Take 1,000 mg by mouth every 12 (twelve) hours as needed.   acetaminophen  (TYLENOL ) 500 MG tablet Take 1,000 mg by mouth every evening.   albuterol  (VENTOLIN  HFA) 108 (90 Base) MCG/ACT inhaler Inhale 2 puffs into the lungs every 6 (six) hours as  needed for wheezing or shortness of breath.   antiseptic oral rinse (BIOTENE) LIQD 30 mLs by Mouth Rinse route 2 (two) times daily. For dry mouth   aspirin 81 MG chewable tablet Chew by mouth daily.   calcium  carbonate (TUMS) 500 MG chewable tablet Chew 1-2 tablets (200-400 mg of elemental calcium  total) by mouth 3 (three) times daily as needed for indigestion or heartburn.   Continuous Glucose Sensor (DEXCOM G7 SENSOR) MISC by Does not apply route. 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 Document site where new sensor is placed.   Estradiol  (IMVEXXY  MAINTENANCE PACK) 4 MCG INST Place 1 suppository vaginally 2 (two) times a week.   Estradiol  Starter Pack (IMVEXXY  STARTER PACK) 4 MCG INST Place 1 suppository vaginally at bedtime.   fluconazole  (DIFLUCAN ) 150 MG tablet Take 150 mg by mouth 2 (two) times a week. Tuesday for redness on vaginal area.   fluticasone  (FLONASE ) 50 MCG/ACT nasal spray Place 1 spray into both nostrils as needed for allergies or rhinitis.   gabapentin  (NEURONTIN ) 100 MG capsule Take 200 mg by mouth at bedtime.   insulin aspart (NOVOLOG) 100 UNIT/ML injection Inject 5 Units into the skin 2 (two) times daily. 8 units if CBG more then 200 12 units if CBG more then 300   insulin glargine (LANTUS) 100 UNIT/ML injection Inject 35 Units into the skin every morning.   Ketotifen Fumarate (EYE ITCH RELIEF OP) Place 1 drop into both eyes every 12 (twelve) hours as needed.   lidocaine  4 % Place 1 patch onto the skin daily. Apply to lower back topically every 24 hours as needed for pain every day PRN   lisinopril  (ZESTRIL ) 2.5 MG tablet Take 2.5 mg by mouth in the morning.   loperamide (IMODIUM A-D) 2 MG tablet Take 2 mg by mouth every 12 (twelve) hours as needed for diarrhea or loose stools.   loratadine  (CLARITIN) 10 MG tablet Take 10 mg by mouth every morning.   Melatonin 5 MG CAPS Take 1 capsule by mouth daily. As needed for insomnia   memantine (NAMENDA) 5 MG tablet Take 10 mg by mouth at bedtime.   metFORMIN  (GLUCOPHAGE ) 500 MG tablet Take 250 mg by mouth every evening.   metFORMIN  (GLUCOPHAGE ) 500 MG tablet Take 500 mg by mouth every morning.   metoprolol  succinate (TOPROL -XL) 100 MG 24 hr tablet Take 100 mg by mouth 2 (two) times daily. Take with or immediately following a meal.   polyethylene glycol (MIRALAX / GLYCOLAX) 17 g packet Take 17 g by mouth as needed. Give 1 scoop by mouth every 24 hours as needed for  constipation   pravastatin  (PRAVACHOL ) 20 MG tablet Take 20 mg by mouth every evening.   pregabalin  (LYRICA ) 75 MG capsule Take 1 capsule (75 mg total) by mouth 2 (two) times daily.   senna-docusate (SENOKOT-S) 8.6-50 MG tablet Take 2 tablets by mouth daily. Give 2 tablets by mouth in the evening for constipation   sertraline  (ZOLOFT ) 50 MG tablet Take 1 tablet (50 mg total) by mouth daily.   sitaGLIPtin  (JANUVIA ) 50 MG tablet Take 1 tablet (50 mg total) by mouth daily.   zinc oxide 20 % ointment Apply 1 application  topically as needed (To buttocks after every incontinent epsiode and as needed for redness).   Hydrocortisone  Acetate (ANUSOL -HC RE) Place 1 application  rectally as needed. (Patient not taking: Reported on 11/08/2023)   No facility-administered encounter medications on file as of 11/08/2023.    Review of Systems  Constitutional:  Negative for activity change and appetite change.  HENT: Negative.    Respiratory:  Negative for cough and shortness of breath.   Cardiovascular:  Negative for leg swelling.  Gastrointestinal:  Negative for constipation.  Genitourinary: Negative.   Musculoskeletal:  Positive for gait problem. Negative for arthralgias and myalgias.  Skin: Negative.   Neurological:  Negative for dizziness and weakness.  Psychiatric/Behavioral:  Positive for  confusion. Negative for dysphoric mood and sleep disturbance.     Immunization History  Administered Date(s) Administered   Fluad Quad(high Dose 65+) 05/03/2022   Influenza Split 03/19/2012, 03/19/2013   Influenza, High Dose Seasonal PF 04/09/2014, 03/22/2015, 03/24/2016, 03/29/2018, 04/16/2020, 05/09/2023   Influenza,inj,Quad PF,6+ Mos 04/09/2014, 03/22/2015, 03/24/2016   Influenza-Unspecified 03/19/2012, 03/19/2013, 04/09/2017, 03/24/2018, 04/16/2020   Moderna Covid-19 Vaccine Bivalent Booster 22yrs & up 05/16/2023   Moderna SARS-COV2 Booster Vaccination 05/16/2022   Moderna Sars-Covid-2 Vaccination 05/19/2021   PFIZER(Purple Top)SARS-COV-2 Vaccination 08/17/2019, 09/10/2019, 04/06/2020, 11/19/2020   Pfizer Covid-19 Vaccine Bivalent Booster 59yrs & up 11/18/2020, 12/21/2021   Pneumococcal Conjugate-13 11/05/2014   Pneumococcal Polysaccharide-23 07/10/2004, 12/12/2010, 05/02/2016   Pneumococcal-Unspecified 07/10/2004, 12/12/2010, 05/02/2016   Respiratory Syncytial Virus Vaccine,Recomb Aduvanted(Arexvy) 06/24/2022   Td 07/10/2009   Tdap 07/06/2022   Zoster Recombinant(Shingrix) 04/09/2017, 07/19/2017   Zoster, Live 07/10/2006   Pertinent  Health Maintenance Due  Topic Date Due   HEMOGLOBIN A1C  12/11/2023   INFLUENZA VACCINE  02/08/2024   OPHTHALMOLOGY EXAM  04/15/2024   FOOT EXAM  05/13/2024   DEXA SCAN  Completed      02/02/2023    2:47 PM 03/14/2023    3:13 PM 04/18/2023    2:05 PM 05/14/2023   12:34 PM 07/19/2023   10:42 AM  Fall Risk  Falls in the past year? 0 0 0 0 0  Was there an injury with Fall? 0 0 0 0 0  Fall Risk Category Calculator 0 0 0 0 0  Patient at Risk for Falls Due to History of fall(s);Impaired balance/gait;Impaired mobility History of fall(s);Impaired balance/gait History of fall(s);Impaired balance/gait;Impaired mobility History of fall(s);Impaired balance/gait;Impaired mobility History of fall(s);Impaired balance/gait;Impaired mobility  Fall risk Follow up  Falls evaluation completed;Education provided;Falls prevention discussed Falls evaluation completed;Education provided  Falls evaluation completed;Education provided;Falls prevention discussed Falls evaluation completed;Education provided;Falls prevention discussed   Functional Status Survey:    Vitals:   11/08/23 1212  BP: (!) 140/74  Pulse: 74  Resp: 20  Temp: 97.8 F (36.6 C)  SpO2: 96%  Weight: 170 lb 3.2 oz (77.2 kg)  Height: 5\' 4"  (1.626 m)   Body mass index is 29.21 kg/m. Physical Exam  Vitals reviewed.  Constitutional:      Appearance: Normal appearance.  HENT:     Head: Normocephalic.     Nose: Nose normal.     Mouth/Throat:     Mouth: Mucous membranes are moist.     Pharynx: Oropharynx is clear.  Eyes:     Pupils: Pupils are equal, round, and reactive to light.  Cardiovascular:     Rate and Rhythm: Normal rate and regular rhythm.     Pulses: Normal pulses.     Heart sounds: Normal heart sounds. No murmur heard. Pulmonary:     Effort: Pulmonary effort is normal.     Breath sounds: Normal breath sounds.  Abdominal:     General: Abdomen is flat. Bowel sounds are normal.     Palpations: Abdomen is soft.  Musculoskeletal:        General: No swelling.     Cervical back: Neck supple.  Skin:    General: Skin is warm.  Neurological:     General: No focal deficit present.     Mental Status: She is alert.  Psychiatric:        Mood and Affect: Mood normal.        Thought Content: Thought content normal.     Labs reviewed: Recent Labs    11/23/22 0000 04/30/23 0000 05/03/23 0000  NA 139 137 138  K 4.5 4.1 4.5  CL 107 108 110*  CO2 22 17 16   BUN 21 32* 40*  CREATININE 1.3* 1.6* 1.4*  CALCIUM  8.8 8.5* 8.7   No results for input(s): "AST", "ALT", "ALKPHOS", "BILITOT", "PROT", "ALBUMIN" in the last 8760 hours. Recent Labs    11/23/22 0000 04/30/23 0000 05/03/23 0000  WBC 9.2 14.4 11.8  NEUTROABS 6,017.00 11,160.00 8,803.00  HGB 12.0 11.2* 11.4*   HCT 36 34* 35*  PLT 323 328 402*   Lab Results  Component Value Date   TSH 3.100 08/12/2019   Lab Results  Component Value Date   HGBA1C 7.4 06/12/2023   Lab Results  Component Value Date   CHOL 113 06/12/2023   HDL 38 06/12/2023   LDLCALC 53 06/12/2023   TRIG 135 06/12/2023   CHOLHDL 4.4 12/19/2018    Significant Diagnostic Results in last 30 days:  No results found.  Assessment/Plan 1. Type 2 diabetes mellitus with stage 3b chronic kidney disease, with long-term current use of insulin (HCC) (Primary) Change Lantus to 40 units  2. Mild dementia without behavioral disturbance, psychotic disturbance, mood disturbance, or anxiety, unspecified dementia type [facility]) Discussed again with the son Reymundo Caulk He agrees that patient is not safe to go back her House And she would need more finances to cover [facility] stay in [facility] He is discussing the situation with his Brother and her Mom   Mrs Armfield also wanted me to reduce her Namenda to 5 mg at night as she thinks it makes her sleepy     Family/ staff Communication:   Labs/tests ordered:

## 2023-11-12 LAB — HM DIABETES EYE EXAM

## 2023-11-20 ENCOUNTER — Ambulatory Visit: Admitting: Obstetrics and Gynecology

## 2023-11-20 ENCOUNTER — Encounter: Payer: Self-pay | Admitting: Obstetrics and Gynecology

## 2023-11-20 VITALS — BP 183/88 | HR 66 | Ht 64.0 in | Wt 176.1 lb

## 2023-11-20 DIAGNOSIS — R309 Painful micturition, unspecified: Secondary | ICD-10-CM

## 2023-11-20 DIAGNOSIS — R102 Pelvic and perineal pain: Secondary | ICD-10-CM | POA: Diagnosis not present

## 2023-11-20 DIAGNOSIS — N958 Other specified menopausal and perimenopausal disorders: Secondary | ICD-10-CM

## 2023-11-20 NOTE — Progress Notes (Signed)
 Pt presents for follow up. No changes since last visit. Treated for yeast and UTI. Questions regarding any alternatives to tylenol  that is recommended for pain control. If after exam, are there any needed referrals or tests.

## 2023-11-20 NOTE — Progress Notes (Signed)
 GYNECOLOGY VISIT  Patient name: [name] [name] MRN [id number]  Date of birth: [birth year] Chief Complaint:   Follow-up   History:  [name] [name] is a 83 y.o. G2P0 being seen today for vaginal pain and dysuria.  Discussed the use of AI scribe software for clinical note transcription with the patient, who gave verbal consent to proceed.  History of Present Illness [name] [name] is an 83 year old female who presents with persistent vaginal pain and urinary discomfort.  She experiences persistent vaginal pain and urinary discomfort, describing the sensation as feeling like a 'cheese grater' in the vaginal area, occurring intermittently. The pain is particularly pronounced during urination, causing her to grimace. It is not a burning sensation but rather a deeper hurting sensation, especially when her bladder is full.  She has been using vaginal boric acid suppositories to address a yeast infection identified in previous swabs. Initially, there was some relief within the first week or two, but the symptoms have mostly returned to her previous level. She is also using vaginal estrogen suppositories, which cause significant discomfort once inserted, making the area very painful.  She wears a pad during the day and a nighttime diaper due to incontinence, particularly at night, where she often wets everything around her while asleep. No issues with excessive moisture on the vulva are reported.  She has been taking extra strength Tylenol  for pain management.      Past Medical History:  Diagnosis Date   Allergy    Seabron Cypress; allergy shots weekly.     Anxiety    Arthritis    DDD lumbar spine.     Cancer (HCC)    breast L x 2; skin basal cell carcinoma Alanda Allegra).   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    Past Surgical History:  Procedure Laterality Date   BREAST LUMPECTOMY  1996   L breast cancer   BREAST SURGERY N/A    Phreesia 12/12/2019   EYE SURGERY N/A    Phreesia 12/12/2019   MASTECTOMY  2002   Bilateral for breast cancer   VAGINAL HYSTERECTOMY  1985   DUB; uterine fibroids; ovaries intact.    The following portions of the patient's history were reviewed and updated as appropriate: allergies, current medications, past family history, past medical history, past social history, past surgical history and problem list.    Review of Systems:  Pertinent items are noted in HPI. Comprehensive review of systems was otherwise negative.   Objective:  Physical Exam BP (!) 183/88   Pulse 66   Ht 5\' 4"  (1.626 m)   Wt 176 lb 1.6 oz (79.9 kg)   BMI 30.23 kg/m    Physical Exam Vitals and nursing note reviewed.  Constitutional:      Appearance: Normal appearance.  HENT:     Head: Normocephalic and atraumatic.  Pulmonary:     Effort: Pulmonary effort is normal.  Skin:    General: Skin is warm and dry.  Neurological:     General: No focal deficit present.     Mental Status: She is alert.  Psychiatric:        Mood and Affect: Mood normal.        Behavior: Behavior normal.  Thought Content: Thought content normal.        Judgment: Judgment normal.         Assessment & Plan:   Assessment and Plan Assessment & Plan Vulvovaginal candidiasis and Vaginal pain  Persistent symptoms despite previous treatment. Current treatment with vaginal estrogen suppositories may cause discomfort and severe pain. - Consider alternative medications for vaginal discomfort if suppositories are intolerable. - Check for potential interactions with current medications before prescribing new treatments.   Dysuria and Urinary incontinence Persistent painful urination, exacerbated when the bladder is full. - Consider alternative medication for painful bladder  sensation after checking for drug interactions. - Place referral to a urogynecologist  - Discuss pain management options with her care facility's doctors.    Routine preventative health maintenance measures emphasized.  Kiki Pelton, MD Minimally Invasive Gynecologic Surgery [facility] for [facility], [facility]

## 2023-11-29 ENCOUNTER — Encounter: Payer: Self-pay | Admitting: Internal Medicine

## 2023-12-05 ENCOUNTER — Telehealth: Payer: Self-pay | Admitting: Licensed Clinical Social Worker

## 2023-12-05 NOTE — Telephone Encounter (Signed)
 [facility] contacted patient on this date to provide [facility] intro and to schedule [facility] appointment. Patient not interested in services at this time.

## 2023-12-06 ENCOUNTER — Non-Acute Institutional Stay (SKILLED_NURSING_FACILITY): Payer: Self-pay | Admitting: Internal Medicine

## 2023-12-06 DIAGNOSIS — F339 Major depressive disorder, recurrent, unspecified: Secondary | ICD-10-CM

## 2023-12-06 DIAGNOSIS — G629 Polyneuropathy, unspecified: Secondary | ICD-10-CM

## 2023-12-06 DIAGNOSIS — E1122 Type 2 diabetes mellitus with diabetic chronic kidney disease: Secondary | ICD-10-CM | POA: Diagnosis not present

## 2023-12-06 DIAGNOSIS — N1832 Chronic kidney disease, stage 3b: Secondary | ICD-10-CM

## 2023-12-06 DIAGNOSIS — F03A0 Unspecified dementia, mild, without behavioral disturbance, psychotic disturbance, mood disturbance, and anxiety: Secondary | ICD-10-CM | POA: Diagnosis not present

## 2023-12-06 DIAGNOSIS — F5101 Primary insomnia: Secondary | ICD-10-CM

## 2023-12-06 DIAGNOSIS — Z794 Long term (current) use of insulin: Secondary | ICD-10-CM

## 2023-12-06 NOTE — Progress Notes (Unsigned)
 [facility]  [facility]  SNF ([id number])  Provider:   Code Status:  Goals of Care:     08/13/2023   10:29 AM  Advanced Directives  Does Patient Have a Medical Advance Directive? Yes  Type of Estate agent of [place];Living will;Out of facility DNR (pink MOST or yellow form)  Does patient want to make changes to medical advance directive? No - Patient declined  Copy of Healthcare Power of Attorney in Chart? No - copy requested     Chief Complaint  Patient presents with   Care Management    HPI: Patient is a 83 y.o. female seen today for medical management of chronic diseases.    Lives in SNF  Diabetes Mellitus CBGS in the morning still running around 160-200  Chronic Vaginitis and Chronic Dysuria Seen By GYN  Swab positive for Candida Glabrata recommended Vaginal Boric acid followed By Estrace  But Estrace  was Discontinued recently by GYN Plan for her to see Urogyencologist  Cognitive decline History of neurodegenerative dementia  Patient has a known history of dementia.  Was seen by [facility] neurology in March 2021 MRI at that time showed moderate atrophy and chronic microvascular ischemia She is on Namenda Refuses to take higher dose  Patient continues to need help with transfers with 2 assist.  She can walk with some assist    Past Medical History:  Diagnosis Date   Allergy    Seabron [place]; allergy shots weekly.     Anxiety    Arthritis    DDD lumbar spine.     Cancer (HCC)    breast L x 2; skin basal cell carcinoma Alanda Allegra).   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    Past Surgical History:  Procedure Laterality Date   BREAST LUMPECTOMY  1996   L breast cancer   BREAST SURGERY N/A    Phreesia 12/12/2019   EYE SURGERY  N/A    Phreesia 12/12/2019   MASTECTOMY  2002   Bilateral for breast cancer   VAGINAL HYSTERECTOMY  1985   DUB; uterine fibroids; ovaries intact.    Allergies  Allergen Reactions   Fruit & Vegetable Daily [Nutritional Supplements] Diarrhea   Mixed Ragweed    Onion Other (See Comments)    indigestion   Tree Extract Swelling   Sulfa Drugs Cross Reactors Rash    All over the body    Outpatient Encounter Medications as of 12/06/2023  Medication Sig   acetaminophen  (TYLENOL ) 500 MG tablet Take 1,000 mg by mouth every 12 (twelve) hours as needed.   acetaminophen  (TYLENOL ) 500 MG tablet Take 1,000 mg by mouth every evening.   albuterol  (VENTOLIN  HFA) 108 (90 Base) MCG/ACT inhaler Inhale 2 puffs into the lungs every 6 (six) hours as needed for wheezing or shortness of breath.   antiseptic oral rinse (BIOTENE) LIQD 30 mLs by Mouth Rinse route 2 (two) times daily. For dry mouth   aspirin 81 MG chewable tablet Chew by mouth daily.   calcium  carbonate (TUMS) 500 MG chewable tablet Chew 1-2 tablets (200-400 mg of elemental calcium  total) by mouth 3 (three) times daily as needed for indigestion or heartburn. (Patient not taking: Reported on 11/20/2023)   Continuous Glucose Sensor (DEXCOM G7 SENSOR)  MISC by Does not apply route. 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 Document site where new sensor is placed.   Estradiol  (IMVEXXY  MAINTENANCE PACK) 4 MCG INST Place 1 suppository vaginally 2 (two) times a week.   fluconazole  (DIFLUCAN ) 150 MG tablet Take 150 mg by mouth 2 (two) times a week. Tuesday for redness on vaginal area.   fluticasone  (FLONASE ) 50 MCG/ACT nasal spray Place 1 spray into both nostrils as needed for allergies or rhinitis.   gabapentin  (NEURONTIN ) 100 MG capsule Take 200 mg by mouth at bedtime.   insulin  aspart (NOVOLOG) 100 UNIT/ML injection Inject 5 Units into the skin 2 (two) times daily. 8 units if CBG more then 200 12 units if CBG more then 300   insulin glargine (LANTUS) 100 UNIT/ML injection Inject 40 Units into the skin every morning.   Ketotifen Fumarate (EYE ITCH RELIEF OP) Place 1 drop into both eyes every 12 (twelve) hours as needed. (Patient not taking: Reported on 11/20/2023)   lidocaine  4 % Place 1 patch onto the skin daily. Apply to lower back topically every 24 hours as needed for pain every day PRN   lisinopril  (ZESTRIL ) 2.5 MG tablet Take 2.5 mg by mouth in the morning.   loperamide (IMODIUM A-D) 2 MG tablet Take 2 mg by mouth every 12 (twelve) hours as needed for diarrhea or loose stools.   loratadine (CLARITIN) 10 MG tablet Take 10 mg by mouth every morning.   Melatonin 5 MG CAPS Take 1 capsule by mouth daily. As needed for insomnia   memantine (NAMENDA) 5 MG tablet Take 5 mg by mouth at bedtime.   metFORMIN  (GLUCOPHAGE ) 500 MG tablet Take 250 mg by mouth every evening. (Patient not taking: Reported on 11/20/2023)   metFORMIN  (GLUCOPHAGE ) 500 MG tablet Take 500 mg by mouth every morning.   metoprolol  succinate (TOPROL -XL) 100 MG 24 hr tablet Take 100 mg by mouth 2 (two) times daily. Take with or immediately following a meal.   polyethylene glycol (MIRALAX / GLYCOLAX) 17 g packet Take 17 g by mouth as needed. Give 1 scoop by mouth every 24 hours as needed for constipation   pravastatin  (PRAVACHOL ) 20 MG tablet Take 20 mg by mouth every evening.   pregabalin  (LYRICA ) 75 MG capsule Take 1 capsule (75 mg total) by mouth 2 (two) times daily.   senna-docusate (SENOKOT-S) 8.6-50 MG tablet Take 2 tablets by mouth daily. Give 2 tablets by mouth in the evening for constipation   sertraline  (ZOLOFT ) 50 MG tablet Take 1 tablet (50 mg total) by mouth daily.   sitaGLIPtin  (JANUVIA ) 50 MG tablet Take 1 tablet (50 mg total) by mouth daily.   zinc oxide 20 % ointment Apply 1 application   topically as needed (To buttocks after every incontinent epsiode and as needed for redness). (Patient not taking: Reported on 11/20/2023)   No facility-administered encounter medications on file as of 12/06/2023.    Review of Systems:  Review of Systems  Health Maintenance  Topic Date Due   Diabetic kidney evaluation - Urine ACR  05/17/2019   COVID-19 Vaccine (9 - 2024-25 season) 07/11/2023   Medicare Annual Wellness (AWV)  02/02/2024   HEMOGLOBIN A1C  12/11/2023   INFLUENZA VACCINE  02/08/2024   Diabetic kidney evaluation - eGFR measurement  05/02/2024   FOOT EXAM  05/13/2024   OPHTHALMOLOGY  EXAM  11/11/2024   DTaP/Tdap/Td (3 - Td or Tdap) 07/06/2032   Pneumonia Vaccine 56+ Years old  Completed   DEXA SCAN  Completed   Zoster Vaccines- Shingrix  Completed   HPV VACCINES  Aged Out   Meningococcal B Vaccine  Aged Out    Physical Exam: There were no vitals filed for this visit. There is no height or weight on file to calculate BMI. Physical Exam  Labs reviewed: Basic Metabolic Panel: Recent Labs    04/30/23 0000 05/03/23 0000  NA 137 138  K 4.1 4.5  CL 108 110*  CO2 17 16  BUN 32* 40*  CREATININE 1.6* 1.4*  CALCIUM  8.5* 8.7   Liver Function Tests: No results for input(s): "AST", "ALT", "ALKPHOS", "BILITOT", "PROT", "ALBUMIN" in the last 8760 hours. No results for input(s): "LIPASE", "AMYLASE" in the last 8760 hours. No results for input(s): "AMMONIA" in the last 8760 hours. CBC: Recent Labs    04/30/23 0000 05/03/23 0000  WBC 14.4 11.8  NEUTROABS 11,160.00 8,803.00  HGB 11.2* 11.4*  HCT 34* 35*  PLT 328 402*   Lipid Panel: Recent Labs    06/12/23 0000  CHOL 113  HDL 38  LDLCALC 53  TRIG 135   Lab Results  Component Value Date   HGBA1C 7.4 06/12/2023    Procedures since last visit: No results found.  Assessment/Plan 1. Type 2 diabetes mellitus with stage 3b chronic kidney disease, with long-term current use of insulin (HCC) (Primary) ***  2.  Mild dementia without behavioral disturbance, psychotic disturbance, mood disturbance, or anxiety, unspecified dementia type (HCC) ***  3. Peripheral polyneuropathy ***  4. Recurrent depression (HCC) ***  5. Primary insomnia ***    Labs/tests ordered:  * No order type specified * Next appt:  Visit date not found

## 2023-12-07 ENCOUNTER — Encounter: Payer: Self-pay | Admitting: Internal Medicine

## 2023-12-19 ENCOUNTER — Non-Acute Institutional Stay (SKILLED_NURSING_FACILITY): Admitting: Orthopedic Surgery

## 2023-12-19 ENCOUNTER — Encounter: Payer: Self-pay | Admitting: Orthopedic Surgery

## 2023-12-19 DIAGNOSIS — R112 Nausea with vomiting, unspecified: Secondary | ICD-10-CM

## 2023-12-19 MED ORDER — OMEPRAZOLE 20 MG PO CPDR: 20.0000 mg | DELAYED_RELEASE_CAPSULE | Freq: Every day | ORAL | Status: DC

## 2023-12-19 MED ORDER — ONDANSETRON HCL 4 MG PO TABS: 4.0000 mg | ORAL_TABLET | Freq: Three times a day (TID) | ORAL | Status: AC | PRN

## 2023-12-19 NOTE — Progress Notes (Signed)
 Location:  [facility] Room Number: 26/A Place of Service:  SNF [phone]) Provider:  Arnetha Bhat, NP   Marguerite Shiley, MD  Patient Care Team: Marguerite Shiley, MD as PCP - General (Internal Medicine) Maris Sickle, MD (Ophthalmology) Boyce Byes, MD as Consulting Physician (General Surgery) Emilie Harden, MD as Consulting Physician (Internal Medicine)  Extended Emergency Contact Information Primary Emergency Contact: [name],[name] Address: [address]          [place] [address] [place] Home Phone: [phone] Mobile Phone: [phone] Relation: Spouse  Code Status:  DNR Goals of care: Advanced Directive information    08/13/2023   10:29 AM  Advanced Directives  Does Patient Have a Medical Advance Directive? Yes  Type of Estate agent of [place];Living will;Out of facility DNR (pink MOST or yellow form)  Does patient want to make changes to medical advance directive? No - Patient declined  Copy of Healthcare Power of Attorney in Chart? No - copy requested     Chief Complaint  Patient presents with   Acute Visit    Nausea and vomiting    HPI:  Pt is a 83 y.o. female seen today for acute visit due to nausea.   She currently resides on the skilled nursing unit at [facility]. PMH: HTN, HLD, GERD, IBS, T2DM, polyneuropathy, DJD, osteopenia, Factor 5 mutation, breast cancer s/p left lumpectomy 1996, incontinence, and anxiety.   Increased nausea with vomiting x 1 day. Symptoms began last night. She was unable to eat breakfast this morning. She is trying to sip on ginger ale and eat crackers. She cannot recall last normal BM. Admits to diarrhea within past 2 days. Denies cold symptoms, chest pain, shortness of breath and abdominal pain. Afebrile. Vitals stable.     Past Medical History:  Diagnosis Date   Allergy    Seabron [place]; allergy shots weekly.     Anxiety    Arthritis    DDD lumbar spine.      Cancer (HCC)    breast L x 2; skin basal cell carcinoma Alanda Allegra).   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   Past Surgical History:  Procedure Laterality Date   BREAST LUMPECTOMY  1996   L breast cancer   BREAST SURGERY N/A    Phreesia 12/12/2019   EYE SURGERY N/A    Phreesia 12/12/2019   MASTECTOMY  2002   Bilateral for breast cancer   VAGINAL HYSTERECTOMY  1985   DUB; uterine fibroids; ovaries intact.    Allergies  Allergen Reactions   Fruit & Vegetable Daily [Nutritional Supplements] Diarrhea   Mixed Ragweed    Onion Other (See Comments)    indigestion   Tree Extract Swelling   Sulfa Drugs Cross Reactors Rash    All over the body    Outpatient Encounter Medications as of 12/19/2023  Medication Sig   acetaminophen  (TYLENOL ) 500 MG tablet Take 1,000 mg by mouth every 12 (twelve) hours as needed.   acetaminophen  (TYLENOL ) 500 MG tablet Take 1,000 mg by mouth every evening.   albuterol  (VENTOLIN  HFA) 108 (90 Base) MCG/ACT inhaler Inhale 2 puffs into the lungs every 6 (six) hours as needed for wheezing or shortness of breath.   antiseptic  oral rinse (BIOTENE) LIQD 30 mLs by Mouth Rinse route 2 (two) times daily. For dry mouth   aspirin 81 MG chewable tablet Chew by mouth daily.   Continuous Glucose Sensor (DEXCOM G7 SENSOR) MISC by Does not apply route. 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 Document site where new sensor is placed.   Estradiol  (IMVEXXY  MAINTENANCE PACK) 4 MCG INST Place 1 suppository vaginally 2 (two) times a week.   fluconazole  (DIFLUCAN ) 150 MG tablet Take 150 mg by mouth 2 (two)  times a week. Tuesday for redness on vaginal area.   fluticasone  (FLONASE ) 50 MCG/ACT nasal spray Place 1 spray into both nostrils as needed for allergies or rhinitis.   gabapentin  (NEURONTIN ) 100 MG capsule Take 200 mg by mouth at bedtime.   insulin aspart (NOVOLOG) 100 UNIT/ML injection Inject 5 Units into the skin 2 (two) times daily. 8 units if CBG more then 200 12 units if CBG more then 300   insulin glargine (LANTUS) 100 UNIT/ML injection Inject 42 Units into the skin every morning.   Ketotifen Fumarate (EYE ITCH RELIEF OP) Place 1 drop into both eyes every 12 (twelve) hours as needed. (Patient not taking: Reported on 11/20/2023)   lidocaine  4 % Place 1 patch onto the skin daily. Apply to lower back topically every 24 hours as needed for pain every day PRN   lisinopril  (ZESTRIL ) 2.5 MG tablet Take 2.5 mg by mouth in the morning.   loperamide (IMODIUM A-D) 2 MG tablet Take 2 mg by mouth every 12 (twelve) hours as needed for diarrhea or loose stools.   loratadine (CLARITIN) 10 MG tablet Take 10 mg by mouth every morning.   Melatonin 5 MG CAPS Take 1 capsule by mouth daily. As needed for insomnia   memantine (NAMENDA) 5 MG tablet Take 5 mg by mouth at bedtime.   metFORMIN  (GLUCOPHAGE ) 500 MG tablet Take 250 mg by mouth every evening.   metFORMIN  (GLUCOPHAGE ) 500 MG tablet Take 500 mg by mouth every morning.   metoprolol  succinate (TOPROL -XL) 100 MG 24 hr tablet Take 100 mg by mouth 2 (two) times daily. Take with or immediately following a meal.   polyethylene glycol (MIRALAX / GLYCOLAX) 17 g packet Take 17 g by mouth as needed. Give 1 scoop by mouth every 24 hours as needed for constipation   pravastatin  (PRAVACHOL ) 20 MG tablet Take 20 mg by mouth every evening.   senna-docusate (SENOKOT-S) 8.6-50 MG tablet Take 2 tablets by mouth daily. Give 2 tablets by mouth in the evening for constipation   sertraline  (ZOLOFT ) 50 MG tablet Take 1 tablet (50 mg total) by mouth daily.   sitaGLIPtin  (JANUVIA )  50 MG tablet Take 1 tablet (50 mg total) by mouth daily.   zinc oxide 20 % ointment Apply 1 application  topically as needed (To buttocks after every incontinent epsiode and as needed for redness).   No facility-administered encounter medications on file as of 12/19/2023.    Review of Systems  Constitutional:  Negative for fatigue and fever.  HENT:  Negative for congestion and sore throat.   Cardiovascular:  Negative for chest pain.  Gastrointestinal:  Positive for diarrhea, nausea and vomiting. Negative for abdominal distention and abdominal pain.  Genitourinary: Negative.   Musculoskeletal:  Positive for gait problem.  Skin:  Negative for wound.  Neurological:  Positive for weakness.  Negative for dizziness.  Psychiatric/Behavioral:  Positive for dysphoric mood.     Immunization History  Administered Date(s) Administered   Fluad Quad(high Dose 65+) 05/03/2022   Influenza Split 03/19/2012, 03/19/2013   Influenza, High Dose Seasonal PF 04/09/2014, 03/22/2015, 03/24/2016, 03/29/2018, 04/16/2020, 05/09/2023   Influenza,inj,Quad PF,6+ Mos 04/09/2014, 03/22/2015, 03/24/2016   Influenza-Unspecified 03/19/2012, 03/19/2013, 04/09/2017, 03/24/2018, 04/16/2020   Moderna Covid-19 Vaccine Bivalent Booster 35yrs & up 05/16/2023   Moderna SARS-COV2 Booster Vaccination 05/16/2022   Moderna Sars-Covid-2 Vaccination 05/19/2021   PFIZER(Purple Top)SARS-COV-2 Vaccination 08/17/2019, 09/10/2019, 04/06/2020, 11/19/2020   Pfizer Covid-19 Vaccine Bivalent Booster 63yrs & up 11/18/2020, 12/21/2021   Pneumococcal Conjugate-13 11/05/2014   Pneumococcal Polysaccharide-23 07/10/2004, 12/12/2010, 05/02/2016   Pneumococcal-Unspecified 07/10/2004, 12/12/2010, 05/02/2016   Respiratory Syncytial Virus Vaccine,Recomb Aduvanted(Arexvy) 06/24/2022   Td 07/10/2009   Tdap 07/06/2022   Zoster Recombinant(Shingrix) 04/09/2017, 07/19/2017   Zoster, Live 07/10/2006   Pertinent  Health Maintenance Due  Topic Date Due    HEMOGLOBIN A1C  12/11/2023   INFLUENZA VACCINE  02/08/2024   FOOT EXAM  05/13/2024   OPHTHALMOLOGY EXAM  11/11/2024   DEXA SCAN  Completed      02/02/2023    2:47 PM 03/14/2023    3:13 PM 04/18/2023    2:05 PM 05/14/2023   12:34 PM 07/19/2023   10:42 AM  Fall Risk  Falls in the past year? 0 0 0 0 0  Was there an injury with Fall? 0 0 0 0 0  Fall Risk Category Calculator 0 0 0 0 0  Patient at Risk for Falls Due to History of fall(s);Impaired balance/gait;Impaired mobility History of fall(s);Impaired balance/gait History of fall(s);Impaired balance/gait;Impaired mobility History of fall(s);Impaired balance/gait;Impaired mobility History of fall(s);Impaired balance/gait;Impaired mobility  Fall risk Follow up Falls evaluation completed;Education provided;Falls prevention discussed Falls evaluation completed;Education provided  Falls evaluation completed;Education provided;Falls prevention discussed Falls evaluation completed;Education provided;Falls prevention discussed   Functional Status Survey:    Vitals:   12/19/23 0954  BP: 139/67  Resp: 18  Temp: (!) 97.4 F (36.3 C)  SpO2: 90%  Weight: 177 lb 6.4 oz (80.5 kg)  Height: 5' 4 (1.626 m)   Body mass index is 30.45 kg/m. Physical Exam Vitals reviewed.  Constitutional:      General: She is not in acute distress. HENT:     Head: Normocephalic.  Eyes:     General:        Right eye: No discharge.        Left eye: No discharge.  Cardiovascular:     Rate and Rhythm: Normal rate and regular rhythm.     Pulses: Normal pulses.     Heart sounds: Normal heart sounds.  Pulmonary:     Effort: Pulmonary effort is normal.     Breath sounds: Normal breath sounds.  Abdominal:     General: There is distension.     Tenderness: There is no abdominal tenderness.     Comments: Hypoactive bowel sounds x 4  Musculoskeletal:        General: Normal range of motion.     Cervical back: Neck supple.  Skin:    General: Skin is warm.      Capillary Refill: Capillary refill takes less than 2 seconds.  Neurological:     Mental Status: She is alert.     Motor: Weakness present.     Gait: Gait abnormal.  Psychiatric:        Mood and Affect: Mood normal.     Labs reviewed: Recent Labs  04/30/23 0000 05/03/23 0000  NA 137 138  K 4.1 4.5  CL 108 110*  CO2 17 16  BUN 32* 40*  CREATININE 1.6* 1.4*  CALCIUM  8.5* 8.7   No results for input(s): AST, ALT, ALKPHOS, BILITOT, PROT, ALBUMIN in the last 8760 hours. Recent Labs    04/30/23 0000 05/03/23 0000  WBC 14.4 11.8  NEUTROABS 11,160.00 8,803.00  HGB 11.2* 11.4*  HCT 34* 35*  PLT 328 402*   Lab Results  Component Value Date   TSH 3.100 08/12/2019   Lab Results  Component Value Date   HGBA1C 7.4 06/12/2023   Lab Results  Component Value Date   CHOL 113 06/12/2023   HDL 38 06/12/2023   LDLCALC 53 06/12/2023   TRIG 135 06/12/2023   CHOLHDL 4.4 12/19/2018    Significant Diagnostic Results in last 30 days:  No results found.  Assessment/Plan 1. Nausea and vomiting, unspecified vomiting type (Primary) - onset x 1 day - admits to diarrhea, BM not normal  - hypoactive bowel sounds and distension, afebrile, denies pain/fever/malaise - KUB - start omeprazole  x 7 days and zofran prn  - discussed bland diet  - omeprazole  (PRILOSEC) 20 MG capsule; Take 1 capsule (20 mg total) by mouth daily for 7 days. - ondansetron (ZOFRAN) 4 MG tablet; Take 1 tablet (4 mg total) by mouth every 8 (eight) hours as needed for nausea or vomiting.    Family/ staff Communication: plan discussed with patient and nurse  Labs/tests ordered:  KUB

## 2023-12-20 ENCOUNTER — Non-Acute Institutional Stay (SKILLED_NURSING_FACILITY): Payer: Self-pay | Admitting: Internal Medicine

## 2023-12-20 DIAGNOSIS — R112 Nausea with vomiting, unspecified: Secondary | ICD-10-CM

## 2023-12-20 DIAGNOSIS — E1122 Type 2 diabetes mellitus with diabetic chronic kidney disease: Secondary | ICD-10-CM | POA: Diagnosis not present

## 2023-12-20 DIAGNOSIS — N1832 Chronic kidney disease, stage 3b: Secondary | ICD-10-CM | POA: Diagnosis not present

## 2023-12-20 DIAGNOSIS — K5901 Slow transit constipation: Secondary | ICD-10-CM | POA: Diagnosis not present

## 2023-12-20 DIAGNOSIS — Z794 Long term (current) use of insulin: Secondary | ICD-10-CM

## 2023-12-21 ENCOUNTER — Encounter: Payer: Self-pay | Admitting: Internal Medicine

## 2023-12-21 NOTE — Progress Notes (Signed)
 [facility] Service:  SNF ([id number])  Provider:   Code Status: DNR Goals of Care:     08/13/2023   10:29 AM  Advanced Directives  Does Patient Have a Medical Advance Directive? Yes  Type of Estate agent of [place];Living will;Out of facility DNR (pink MOST or yellow form)  Does patient want to make changes to medical advance directive? No - Patient declined  Copy of Healthcare Power of Attorney in Chart? No - copy requested     Chief Complaint  Patient presents with   Acute Visit    HPI: Patient is a 83 y.o. 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  Has not been having proper bowel movements.  Some abdominal discomfort She was given Zofran  she feels better this today was able to eat her breakfast.  Her abdomen is still little distended. Patient's KUB showed mild to moderate stool in colon.  And nonspecific Bowel Gas Pattern no obstruction   Past Medical History:  Diagnosis Date   Allergy    [place]; allergy shots weekly.     Anxiety    Arthritis    DDD lumbar spine.     Cancer (HCC)    breast L x 2; skin basal cell carcinoma [facility]).   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    Past Surgical History:  Procedure Laterality Date   BREAST LUMPECTOMY  1996   L breast cancer   BREAST SURGERY N/A    Phreesia 12/12/2019   EYE SURGERY N/A    Phreesia 12/12/2019   MASTECTOMY  2002   Bilateral for breast cancer   VAGINAL HYSTERECTOMY  1985   DUB; uterine fibroids; ovaries intact.    Allergies  Allergen  Reactions   Fruit & Vegetable Daily [Nutritional Supplements] Diarrhea   Mixed Ragweed    Onion Other (See Comments)    indigestion   Tree Extract Swelling   Sulfa Drugs Cross Reactors Rash    All over the body    Outpatient Encounter Medications as of 12/20/2023  Medication Sig   acetaminophen  (TYLENOL ) 500 MG tablet Take 1,000 mg by mouth every 12 (twelve) hours as needed.   acetaminophen  (TYLENOL ) 500 MG tablet Take 1,000 mg by mouth every evening.   albuterol  (VENTOLIN  HFA) 108 (90 Base) MCG/ACT inhaler Inhale 2 puffs into the lungs every 6 (six) hours as needed for wheezing or shortness of breath.   antiseptic oral rinse (BIOTENE) LIQD 30 mLs by Mouth Rinse route 2 (two) times daily. For dry mouth   aspirin 81 MG chewable tablet Chew by mouth daily.   Continuous Glucose Sensor (DEXCOM G7 SENSOR) MISC by Does not apply route. 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 Document site where new sensor is placed.   Estradiol  (IMVEXXY  MAINTENANCE PACK) 4 MCG INST Place 1 suppository vaginally 2 (two) times a week.   fluconazole  (  DIFLUCAN ) 150 MG tablet Take 150 mg by mouth 2 (two) times a week. Tuesday for redness on vaginal area.   fluticasone  (FLONASE ) 50 MCG/ACT nasal spray Place 1 spray into both nostrils as needed for allergies or rhinitis.   gabapentin  (NEURONTIN ) 100 MG capsule Take 200 mg by mouth at bedtime.   insulin aspart (NOVOLOG) 100 UNIT/ML injection Inject 5 Units into the skin 2 (two) times daily. 8 units if CBG more then 200 12 units if CBG more then 300   insulin glargine (LANTUS) 100 UNIT/ML injection Inject 42 Units into the skin every morning.   lidocaine  4 % Place 1 patch onto the skin daily. Apply to lower back topically every 24 hours as needed for pain every day PRN   lisinopril  (ZESTRIL ) 2.5  MG tablet Take 2.5 mg by mouth in the morning.   loperamide (IMODIUM A-D) 2 MG tablet Take 2 mg by mouth every 12 (twelve) hours as needed for diarrhea or loose stools.   loratadine (CLARITIN) 10 MG tablet Take 10 mg by mouth every morning.   Melatonin 5 MG CAPS Take 1 capsule by mouth daily. As needed for insomnia   memantine (NAMENDA) 5 MG tablet Take 5 mg by mouth at bedtime.   metFORMIN  (GLUCOPHAGE ) 500 MG tablet Take 250 mg by mouth every evening.   metFORMIN  (GLUCOPHAGE ) 500 MG tablet Take 500 mg by mouth every morning.   metoprolol  succinate (TOPROL -XL) 100 MG 24 hr tablet Take 100 mg by mouth 2 (two) times daily. Take with or immediately following a meal.   omeprazole  (PRILOSEC) 20 MG capsule Take 1 capsule (20 mg total) by mouth daily for 7 days.   ondansetron  (ZOFRAN ) 4 MG tablet Take 1 tablet (4 mg total) by mouth every 8 (eight) hours as needed for nausea or vomiting.   polyethylene glycol (MIRALAX / GLYCOLAX) 17 g packet Take 17 g by mouth as needed. Give 1 scoop by mouth every 24 hours as needed for constipation   pravastatin  (PRAVACHOL ) 20 MG tablet Take 20 mg by mouth every evening.   senna-docusate (SENOKOT-S) 8.6-50 MG tablet Take 2 tablets by mouth daily. Give 2 tablets by mouth in the evening for constipation   sertraline  (ZOLOFT ) 50 MG tablet Take 1 tablet (50 mg total) by mouth daily.   sitaGLIPtin  (JANUVIA ) 50 MG tablet Take 1 tablet (50 mg total) by mouth daily.   zinc oxide 20 % ointment Apply 1 application  topically as needed (To buttocks after every incontinent epsiode and as needed for redness).   No facility-administered encounter medications on file as of 12/20/2023.    Review of Systems:  Review of Systems  Constitutional:  Positive for appetite change. Negative for activity change.  HENT: Negative.    Respiratory:  Negative for cough and shortness of breath.   Cardiovascular:  Negative for leg swelling.  Gastrointestinal:  Positive for abdominal distention,  constipation and nausea.  Genitourinary: Negative.   Musculoskeletal:  Positive for gait problem. Negative for arthralgias and myalgias.  Skin: Negative.   Neurological:  Negative for dizziness and weakness.  Psychiatric/Behavioral:  Positive for confusion. Negative for dysphoric mood and sleep disturbance.     Health Maintenance  Topic Date Due   Diabetic kidney evaluation - Urine ACR  05/17/2019   COVID-19 Vaccine (9 - 2024-25 season) 07/11/2023   HEMOGLOBIN A1C  12/11/2023   Medicare Annual Wellness (AWV)  02/02/2024   INFLUENZA VACCINE  02/08/2024   Diabetic kidney evaluation - eGFR measurement  05/02/2024  FOOT EXAM  05/13/2024   OPHTHALMOLOGY EXAM  11/11/2024   DTaP/Tdap/Td (3 - Td or Tdap) 07/06/2032   Pneumococcal Vaccine: 50+ Years  Completed   DEXA SCAN  Completed   Zoster Vaccines- Shingrix  Completed   HPV VACCINES  Aged Out   Meningococcal B Vaccine  Aged Out    Physical Exam: Vitals:   12/21/23 1138  BP: 139/67  Pulse: 68  Resp: 18  Temp: (!) 97.4 F (36.3 C)  Weight: 177 lb (80.3 kg)   Body mass index is 30.38 kg/m. Physical Exam Vitals reviewed.  Constitutional:      Appearance: Normal appearance.  HENT:     Head: Normocephalic.     Nose: Nose normal.     Mouth/Throat:     Mouth: Mucous membranes are moist.     Pharynx: Oropharynx is clear.   Eyes:     Pupils: Pupils are equal, round, and reactive to light.    Cardiovascular:     Rate and Rhythm: Normal rate and regular rhythm.     Pulses: Normal pulses.     Heart sounds: Normal heart sounds. No murmur heard. Pulmonary:     Effort: Pulmonary effort is normal.     Breath sounds: Normal breath sounds.  Abdominal:     General: Abdomen is flat. Bowel sounds are normal. There is distension.     Palpations: Abdomen is soft.     Comments: No Tender   Musculoskeletal:        General: No swelling.     Cervical back: Neck supple.   Skin:    General: Skin is warm.   Neurological:      Mental Status: She is alert.   Psychiatric:        Mood and Affect: Mood normal.        Thought Content: Thought content normal.     Labs reviewed: Basic Metabolic Panel: Recent Labs    04/30/23 0000 05/03/23 0000  NA 137 138  K 4.1 4.5  CL 108 110*  CO2 17 16  BUN 32* 40*  CREATININE 1.6* 1.4*  CALCIUM  8.5* 8.7   Liver Function Tests: No results for input(s): AST, ALT, ALKPHOS, BILITOT, PROT, ALBUMIN in the last 8760 hours. No results for input(s): LIPASE, AMYLASE in the last 8760 hours. No results for input(s): AMMONIA in the last 8760 hours. CBC: Recent Labs    04/30/23 0000 05/03/23 0000  WBC 14.4 11.8  NEUTROABS 11,160.00 8,803.00  HGB 11.2* 11.4*  HCT 34* 35*  PLT 328 402*   Lipid Panel: Recent Labs    06/12/23 0000  CHOL 113  HDL 38  LDLCALC 53  TRIG 135   Lab Results  Component Value Date   HGBA1C 7.4 06/12/2023    Procedures since last visit: No results found.  Assessment/Plan 1. Slow transit constipation (Primary) Will start on Miralax every day for 3 days to Clean  KUB showed Moderate Stool burden  2. Nausea and vomiting, unspecified vomiting type Zofran  PRN  3. Type 2 diabetes mellitus with stage 3b chronic kidney disease, with long-term current use of insulin (HCC) Morning Insulin was held due to not eating well    Labs/tests ordered:  * No order type specified * Next appt:  Visit date not found

## 2024-01-07 ENCOUNTER — Encounter: Payer: Self-pay | Admitting: Orthopedic Surgery

## 2024-01-07 ENCOUNTER — Non-Acute Institutional Stay (SKILLED_NURSING_FACILITY): Payer: Self-pay | Admitting: Orthopedic Surgery

## 2024-01-07 DIAGNOSIS — E785 Hyperlipidemia, unspecified: Secondary | ICD-10-CM

## 2024-01-07 DIAGNOSIS — I10 Essential (primary) hypertension: Secondary | ICD-10-CM

## 2024-01-07 DIAGNOSIS — F03A0 Unspecified dementia, mild, without behavioral disturbance, psychotic disturbance, mood disturbance, and anxiety: Secondary | ICD-10-CM | POA: Diagnosis not present

## 2024-01-07 DIAGNOSIS — G629 Polyneuropathy, unspecified: Secondary | ICD-10-CM

## 2024-01-07 DIAGNOSIS — Z794 Long term (current) use of insulin: Secondary | ICD-10-CM

## 2024-01-07 DIAGNOSIS — F339 Major depressive disorder, recurrent, unspecified: Secondary | ICD-10-CM

## 2024-01-07 DIAGNOSIS — N761 Subacute and chronic vaginitis: Secondary | ICD-10-CM | POA: Diagnosis not present

## 2024-01-07 DIAGNOSIS — N1832 Chronic kidney disease, stage 3b: Secondary | ICD-10-CM

## 2024-01-07 DIAGNOSIS — E1169 Type 2 diabetes mellitus with other specified complication: Secondary | ICD-10-CM

## 2024-01-07 DIAGNOSIS — E1165 Type 2 diabetes mellitus with hyperglycemia: Secondary | ICD-10-CM

## 2024-01-07 NOTE — Progress Notes (Unsigned)
 Location:  [facility] Room Number: 26/A Place of Service:  SNF [phone]) Provider:  Greig FORBES Cluster, NP   Charlanne Fredia CROME, MD  Patient Care Team: Charlanne Fredia CROME, MD as PCP - General (Internal Medicine) Octavia Charleston, MD (Ophthalmology) Gail Favorite, MD as Consulting Physician (General Surgery) Trixie File, MD as Consulting Physician (Internal Medicine)  Extended Emergency Contact Information Primary Emergency Contact: [name],[name] Address: [address]          [place] [address] [place] Home Phone: [phone] Mobile Phone: [phone] Relation: Spouse  Code Status:  DNR Goals of care: Advanced Directive information    08/13/2023   10:29 AM  Advanced Directives  Does Patient Have a Medical Advance Directive? Yes  Type of Estate agent of [place];Living will;Out of facility DNR (pink MOST or yellow form)  Does patient want to make changes to medical advance directive? No - Patient declined  Copy of Healthcare Power of Attorney in Chart? No - copy requested     Chief Complaint  Patient presents with  . Medical Management of Chronic Issues    HPI:  Pt is a 83 y.o. female seen today for medical management of chronic diseases.    She currently resides on the skilled nursing unit at [facility]. PMH: HTN, HLD, GERD, IBS, T2DM, polyneuropathy, DJD, osteopenia, Factor 5 mutation, breast cancer s/p  left lumpectomy 1996, incontinence, and anxiety.    Followed by palliative. Enrolled with hospice in past.    Chronic vaginitis- past urology workup found yeast, recently seen by gynecology, vaginal swab positive for Candida Galabrata> boric acid then Estrace  recommended, reports improved symptoms since starting boric acid, remains on estradiol  suppository 2x/week,  T2DM- A1c 8.9 (04/24)> was 7.4 (12/02), urine microalbumin 03/15/2023> normal, no hypoglycemic events, blood sugars 150-200's, off Invokana  due to  frequent genitourinary infections, remains on reduced metformin  due to declined renal function, remains on Januvia , Lantus and SSI Dementia-  MMSE 25/30 2021, BIMS score 15/15 (04/03)>was 15/15 (12/27), diagnosed with neurodegenerative disorder per neurology, ambulates with w/c, continues to have behaviors towards staff/husband, remains on Namenda HTN- BUN/creat 31/1.23 11/01/2023, remains on metoprolol  and lisinopril  HLD- total 113, LDL 53 (12/02), on pravastatin  CKD- GFR 44 (04/24)> was 32 (10/22) Peripheral neuropathy- remains on Lyrica  and gabapentin  Depression- Na+ 135 (04/24), associated with current health state and living in SNF, remains on Zoloft      Past Medical History:  Diagnosis Date  . Allergy    Fleeta [place]; allergy shots weekly.    . Anxiety   . Arthritis    DDD lumbar spine.    . 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  . 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 Diabetes mellitus   . Diabetes mellitus without complication (HCC)    Phreesia 12/12/2019  . Hypertension   . IBS (irritable bowel syndrome)    diarrhea predominant.   Past Surgical History:  Procedure Laterality Date  . BREAST LUMPECTOMY  1996   L breast cancer  . BREAST SURGERY N/A    Phreesia 12/12/2019  . EYE SURGERY N/A    Phreesia 12/12/2019  . MASTECTOMY  2002   Bilateral for breast cancer  . VAGINAL HYSTERECTOMY  1985   DUB; uterine fibroids; ovaries intact.    Allergies  Allergen Reactions  . Fruit & Vegetable Daily [Nutritional Supplements] Diarrhea  . Mixed Ragweed   .  Onion Other (See Comments)    indigestion  . Tree Extract Swelling  . Sulfa Drugs Cross Reactors Rash    All over the body    Outpatient Encounter Medications as of 01/07/2024  Medication Sig  . acetaminophen  (TYLENOL ) 500 MG tablet Take 1,000 mg by mouth every 12 (twelve) hours as needed.  . acetaminophen  (TYLENOL ) 500 MG tablet Take 1,000 mg by mouth  every evening.  . albuterol  (VENTOLIN  HFA) 108 (90 Base) MCG/ACT inhaler Inhale 2 puffs into the lungs every 6 (six) hours as needed for wheezing or shortness of breath.  SABRA antiseptic oral rinse (BIOTENE) LIQD 30 mLs by Mouth Rinse route 2 (two) times daily. For dry mouth  . aspirin 81 MG chewable tablet Chew by mouth daily.  . Continuous Glucose Sensor (DEXCOM G7 SENSOR) MISC by Does not apply route. 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 Document site where new sensor is placed.  . Estradiol  (IMVEXXY  MAINTENANCE PACK) 4 MCG INST Place 1 suppository vaginally 2 (two) times a week.  . fluconazole  (DIFLUCAN ) 150 MG tablet Take 150 mg by mouth 2 (two) times a week. Tuesday for redness on vaginal area.  . fluticasone  (FLONASE ) 50 MCG/ACT nasal spray Place 1 spray into both nostrils as needed for allergies or rhinitis.  . gabapentin  (NEURONTIN ) 100 MG capsule Take 200 mg by mouth at bedtime.  . insulin aspart (NOVOLOG) 100 UNIT/ML injection Inject 5 Units into the skin 2 (two) times daily. 8 units if CBG more then 200 12 units if CBG more then 300  . insulin glargine (LANTUS) 100 UNIT/ML injection Inject 42 Units into the skin every morning.  . lidocaine  4 % Place 1 patch onto the skin daily. Apply to lower back topically every 24 hours as needed for pain every day PRN  . lisinopril  (ZESTRIL ) 2.5 MG tablet Take 2.5 mg by mouth in the morning.  . loperamide (IMODIUM A-D) 2 MG tablet Take 2 mg by mouth every 12 (twelve) hours as needed for diarrhea or loose stools.  SABRA loratadine (CLARITIN) 10 MG tablet Take 10 mg by mouth every morning.  . Melatonin 5 MG CAPS Take 1 capsule by mouth daily. As needed for insomnia  . memantine (NAMENDA) 5 MG tablet Take 5 mg by mouth at bedtime.  . metFORMIN  (GLUCOPHAGE ) 500 MG tablet Take 250 mg  by mouth every evening.  . metFORMIN  (GLUCOPHAGE ) 500 MG tablet Take 500 mg by mouth every morning.  . metoprolol  succinate (TOPROL -XL) 100 MG 24 hr tablet Take 100 mg by mouth 2 (two) times daily. Take with or immediately following a meal.  . omeprazole  (PRILOSEC) 20 MG capsule Take 1 capsule (20 mg total) by mouth daily for 7 days.  . ondansetron  (ZOFRAN ) 4 MG tablet Take 1 tablet (4 mg total) by mouth every 8 (eight) hours as needed for nausea or vomiting.  . polyethylene glycol (MIRALAX / GLYCOLAX) 17 g packet Take 17 g by mouth as needed. Give 1 scoop by mouth every 24 hours as needed for constipation  . pravastatin  (PRAVACHOL ) 20 MG tablet Take 20 mg by mouth every evening.  . senna-docusate (SENOKOT-S) 8.6-50 MG tablet Take 2 tablets by mouth daily. Give 2 tablets by mouth in the evening for constipation  . sertraline  (ZOLOFT ) 50 MG tablet Take 1 tablet (50 mg total) by mouth daily.  SABRA  sitaGLIPtin  (JANUVIA ) 50 MG tablet Take 1 tablet (50 mg total) by mouth daily.  SABRA zinc oxide 20 % ointment Apply 1 application  topically as needed (To buttocks after every incontinent epsiode and as needed for redness).   No facility-administered encounter medications on file as of 01/07/2024.    Review of Systems  Immunization History  Administered Date(s) Administered  . Fluad Quad(high Dose 65+) 05/03/2022  . Influenza Split 03/19/2012, 03/19/2013  . Influenza, High Dose Seasonal PF 04/09/2014, 03/22/2015, 03/24/2016, 03/29/2018, 04/16/2020, 05/09/2023  . Influenza,inj,Quad PF,6+ Mos 04/09/2014, 03/22/2015, 03/24/2016  . Influenza-Unspecified 03/19/2012, 03/19/2013, 04/09/2017, 03/24/2018, 04/16/2020  . Moderna Covid-19 Vaccine Bivalent Booster 38yrs & up 05/16/2023  . Moderna SARS-COV2 Booster Vaccination 05/16/2022  . Moderna Sars-Covid-2 Vaccination 05/19/2021  . PFIZER(Purple Top)SARS-COV-2 Vaccination 08/17/2019, 09/10/2019, 04/06/2020, 11/19/2020  . Pfizer Covid-19 Vaccine Bivalent Booster  68yrs & up 11/18/2020, 12/21/2021  . Pneumococcal Conjugate-13 11/05/2014  . Pneumococcal Polysaccharide-23 07/10/2004, 12/12/2010, 05/02/2016  . Pneumococcal-Unspecified 07/10/2004, 12/12/2010, 05/02/2016  . Respiratory Syncytial Virus Vaccine,Recomb Aduvanted(Arexvy) 06/24/2022  . Td 07/10/2009  . Tdap 07/06/2022  . Zoster Recombinant(Shingrix) 04/09/2017, 07/19/2017  . Zoster, Live 07/10/2006   Pertinent  Health Maintenance Due  Topic Date Due  . HEMOGLOBIN A1C  12/11/2023  . INFLUENZA VACCINE  02/08/2024  . FOOT EXAM  05/13/2024  . OPHTHALMOLOGY EXAM  11/11/2024  . DEXA SCAN  Completed      02/02/2023    2:47 PM 03/14/2023    3:13 PM 04/18/2023    2:05 PM 05/14/2023   12:34 PM 07/19/2023   10:42 AM  Fall Risk  Falls in the past year? 0 0 0 0 0  Was there an injury with Fall? 0 0 0 0 0  Fall Risk Category Calculator 0 0 0 0 0  Patient at Risk for Falls Due to History of fall(s);Impaired balance/gait;Impaired mobility History of fall(s);Impaired balance/gait History of fall(s);Impaired balance/gait;Impaired mobility History of fall(s);Impaired balance/gait;Impaired mobility History of fall(s);Impaired balance/gait;Impaired mobility  Fall risk Follow up Falls evaluation completed;Education provided;Falls prevention discussed Falls evaluation completed;Education provided  Falls evaluation completed;Education provided;Falls prevention discussed Falls evaluation completed;Education provided;Falls prevention discussed   Functional Status Survey:    Vitals:   01/07/24 1015  BP: 118/73  Pulse: 67  Resp: (!) 22  Temp: (!) 97.5 F (36.4 C)  SpO2: 96%  Weight: 177 lb 6.4 oz (80.5 kg)  Height: 5' 4 (1.626 m)   Body mass index is 30.45 kg/m. Physical Exam  Labs reviewed: Recent Labs    04/30/23 0000 05/03/23 0000  NA 137 138  K 4.1 4.5  CL 108 110*  CO2 17 16  BUN 32* 40*  CREATININE 1.6* 1.4*  CALCIUM  8.5* 8.7   No results for input(s): AST, ALT, ALKPHOS,  BILITOT, PROT, ALBUMIN in the last 8760 hours. Recent Labs    04/30/23 0000 05/03/23 0000  WBC 14.4 11.8  NEUTROABS 11,160.00 8,803.00  HGB 11.2* 11.4*  HCT 34* 35*  PLT 328 402*   Lab Results  Component Value Date   TSH 3.100 08/12/2019   Lab Results  Component Value Date   HGBA1C 7.4 06/12/2023   Lab Results  Component Value Date   CHOL 113 06/12/2023   HDL 38 06/12/2023   LDLCALC 53 06/12/2023   TRIG 135 06/12/2023   CHOLHDL 4.4 12/19/2018    Significant Diagnostic Results in last 30 days:  No results found.  Assessment/Plan There are no diagnoses linked to this encounter.   Family/ staff Communication: ***  Labs/tests ordered:  ***

## 2024-01-26 ENCOUNTER — Other Ambulatory Visit: Payer: Self-pay

## 2024-01-26 ENCOUNTER — Encounter (HOSPITAL_COMMUNITY): Payer: Self-pay

## 2024-01-26 ENCOUNTER — Emergency Department (HOSPITAL_COMMUNITY)
Admission: EM | Admit: 2024-01-26 | Discharge: 2024-01-26 | Disposition: A | Discharge status: Home / Self Care | Source: Skilled Nursing Facility | Attending: Emergency Medicine | Admitting: Emergency Medicine

## 2024-01-26 DIAGNOSIS — Z794 Long term (current) use of insulin: Secondary | ICD-10-CM | POA: Insufficient documentation

## 2024-01-26 DIAGNOSIS — N3000 Acute cystitis without hematuria: Secondary | ICD-10-CM | POA: Diagnosis not present

## 2024-01-26 DIAGNOSIS — Z7982 Long term (current) use of aspirin: Secondary | ICD-10-CM | POA: Diagnosis not present

## 2024-01-26 DIAGNOSIS — R102 Pelvic and perineal pain: Secondary | ICD-10-CM | POA: Diagnosis present

## 2024-01-26 LAB — URINALYSIS, ROUTINE W REFLEX MICROSCOPIC
Bilirubin Urine: NEGATIVE
Glucose, UA: NEGATIVE mg/dL
Ketones, ur: NEGATIVE mg/dL
Nitrite: NEGATIVE
Protein, ur: 30 mg/dL — AB
Specific Gravity, Urine: 1.01 (ref 1.005–1.030)
WBC, UA: >50 WBC/hpf (ref 0–5)
pH: 5 (ref 5.0–8.0)

## 2024-01-26 MED ORDER — LIDOCAINE HCL URETHRAL/MUCOSAL 2 % EX GEL
1.0000 | Freq: Once | CUTANEOUS | Status: AC
  Administered 2024-01-26: 1 via TOPICAL
  Filled 2024-01-26: qty 11

## 2024-01-26 MED ORDER — CEPHALEXIN 500 MG PO CAPS: 500.0000 mg | ORAL_CAPSULE | Freq: Two times a day (BID) | ORAL | 0 refills | Status: DC

## 2024-01-26 MED ORDER — CEPHALEXIN 500 MG PO CAPS
500.0000 mg | ORAL_CAPSULE | Freq: Once | ORAL | Status: AC
  Administered 2024-01-26: 500 mg via ORAL
  Filled 2024-01-26: qty 1

## 2024-01-26 NOTE — ED Provider Notes (Signed)
 [place] EMERGENCY DEPARTMENT AT [facility] Provider Note   CSN: [id number] Arrival date & time: 01/26/24  0140     Patient presents with: Vaginal Pain   [name] [name] is a 83 y.o. female.   The history is provided by the patient.  Vaginal Pain This is a chronic problem. The current episode started more than 1 week ago. The problem occurs constantly. The problem has not changed since onset.Pertinent negatives include no chest pain, no abdominal pain, no headaches and no shortness of breath. Nothing aggravates the symptoms. Nothing relieves the symptoms. She has tried nothing for the symptoms. The treatment provided no relief.       Prior to Admission medications   Medication Sig Start Date End Date Taking? Authorizing Provider  cephALEXin  (KEFLEX ) 500 MG capsule Take 1 capsule (500 mg total) by mouth 2 (two) times daily. 01/26/24  Yes Jamira Barfuss, MD  acetaminophen  (TYLENOL ) 500 MG tablet Take 1,000 mg by mouth every 12 (twelve) hours as needed.    [provider]  acetaminophen  (TYLENOL ) 500 MG tablet Take 1,000 mg by mouth every evening.    [provider]  albuterol  (VENTOLIN  HFA) 108 (90 Base) MCG/ACT inhaler Inhale 2 puffs into the lungs every 6 (six) hours as needed for wheezing or shortness of breath. 04/03/22   [name], [name], NP  antiseptic oral rinse (BIOTENE) LIQD 30 mLs by Mouth Rinse route 2 (two) times daily. For dry mouth    [provider]  aspirin 81 MG chewable tablet Chew by mouth daily.    [provider]  Continuous Glucose Sensor (DEXCOM G7 SENSOR) MISC by Does not apply route. 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 Document site where new sensor is placed.    [provider]  Estradiol  (IMVEXXY  MAINTENANCE PACK) 4 MCG INST  Place 1 suppository vaginally 2 (two) times a week. 09/20/23   [name], [name], MD  fluconazole  (DIFLUCAN ) 150 MG tablet Take 150 mg by mouth 2 (two) times a week. Tuesday for redness on vaginal area.    [provider]  fluticasone  (FLONASE ) 50 MCG/ACT nasal spray Place 1 spray into both nostrils as needed for allergies or rhinitis.    [provider]  gabapentin  (NEURONTIN ) 100 MG capsule Take 200 mg by mouth at bedtime.    [provider]  insulin aspart (NOVOLOG) 100 UNIT/ML injection Inject 5 Units into the skin 2 (two) times daily. 8 units if CBG more then 200 12 units if CBG more then 300    [provider]  insulin glargine (LANTUS) 100 UNIT/ML injection Inject 42 Units into the skin every morning.    [provider]  lidocaine  4 % Place 1 patch onto the skin daily. Apply to lower back topically every 24 hours as needed for pain every day PRN    [provider]  lisinopril  (ZESTRIL ) 2.5 MG tablet Take 2.5 mg by mouth in the morning.    [provider]  loperamide (IMODIUM A-D) 2 MG tablet Take 2 mg by mouth every 12 (twelve) hours as needed for diarrhea or loose stools.    [provider]  loratadine (CLARITIN) 10 MG tablet Take 10 mg by mouth every morning.    [provider]  Melatonin 5 MG CAPS Take 1 capsule by mouth daily. As needed for  insomnia    [provider]  memantine (NAMENDA) 5 MG tablet Take 5 mg by mouth at bedtime.    [provider]  metFORMIN  (GLUCOPHAGE ) 500 MG tablet Take 250 mg by mouth every evening.    [provider]  metFORMIN  (GLUCOPHAGE ) 500 MG tablet Take 500 mg by mouth every morning.    [provider]  metoprolol  succinate (TOPROL -XL) 100 MG 24 hr tablet Take 100 mg by mouth 2 (two) times daily. Take with or immediately following a meal.    [provider]  omeprazole  (PRILOSEC) 20 MG capsule Take 1 capsule (20 mg total) by mouth daily  for 7 days. 12/19/23 12/26/23  Gil Greig BRAVO, NP  ondansetron  (ZOFRAN ) 4 MG tablet Take 1 tablet (4 mg total) by mouth every 8 (eight) hours as needed for nausea or vomiting. 12/19/23   [name], [name], NP  polyethylene glycol (MIRALAX / GLYCOLAX) 17 g packet Take 17 g by mouth as needed. Give 1 scoop by mouth every 24 hours as needed for constipation    [provider]  pravastatin  (PRAVACHOL ) 20 MG tablet Take 20 mg by mouth every evening.    [provider]  senna-docusate (SENOKOT-S) 8.6-50 MG tablet Take 2 tablets by mouth daily. Give 2 tablets by mouth in the evening for constipation    [provider]  sertraline  (ZOLOFT ) 50 MG tablet Take 1 tablet (50 mg total) by mouth daily. 04/25/23   [name], [name], NP  sitaGLIPtin  (JANUVIA ) 50 MG tablet Take 1 tablet (50 mg total) by mouth daily. 03/21/23   [name], [name], NP  zinc oxide 20 % ointment Apply 1 application  topically as needed (To buttocks after every incontinent epsiode and as needed for redness).    [provider]    Allergies: Fruit & vegetable daily [nutritional supplements], Mixed ragweed, Onion, Tree extract, and Sulfa drugs cross reactors    Review of Systems  Constitutional:  Negative for fever.  HENT:  Negative for facial swelling.   Respiratory:  Negative for shortness of breath.   Cardiovascular:  Negative for chest pain.  Gastrointestinal:  Negative for abdominal pain.  Genitourinary:  Positive for vaginal pain. Negative for dysuria.  Neurological:  Negative for headaches.  All other systems reviewed and are negative.   Updated Vital Signs BP (!) 164/79   Pulse 77   Temp 97.9 F (36.6 C)   Resp 19   SpO2 99%   Physical Exam Vitals and nursing note reviewed.  Constitutional:      General: She is not in acute distress.    Appearance: Normal appearance. She is well-developed.  HENT:     Head: Normocephalic and atraumatic.     Nose: Nose normal.  Eyes:     Pupils: Pupils are equal,  round, and reactive to light.  Cardiovascular:     Rate and Rhythm: Normal rate and regular rhythm.     Pulses: Normal pulses.     Heart sounds: Normal heart sounds.  Pulmonary:     Effort: Pulmonary effort is normal. No respiratory distress.     Breath sounds: Normal breath sounds.  Abdominal:     General: Bowel sounds are normal. There is no distension.     Palpations: Abdomen is soft.     Tenderness: There is no abdominal tenderness. There is no guarding or rebound.  Musculoskeletal:        General: Normal range of motion.     Cervical back: Neck supple.  Skin:    General: Skin is dry.     Capillary Refill: Capillary refill takes less than 2 seconds.     Findings: No erythema or rash.  Neurological:     General: No focal deficit present.     Mental Status: She is alert.     Deep Tendon Reflexes: Reflexes normal.  Psychiatric:        Mood and Affect: Mood normal.     (all labs ordered are listed, but only abnormal results are displayed) Labs Reviewed  URINALYSIS, ROUTINE W REFLEX MICROSCOPIC - Abnormal; Notable for the following components:      Result Value   APPearance CLOUDY (*)    Hgb urine dipstick MODERATE (*)    Protein, ur 30 (*)    Leukocytes,Ua LARGE (*)    Bacteria, UA RARE (*)    All other components within normal limits    EKG: None  Radiology: No results found.   Procedures   Medications Ordered in the ED  lidocaine  (XYLOCAINE ) 2 % jelly 1 Application (has no administration in time range)  cephALEXin  (KEFLEX ) capsule 500 mg (has no administration in time range)                                    Medical Decision Making Patient with ongoing vaginal pain has seen GYN and no relief   Amount and/or Complexity of Data Reviewed Independent Historian: EMS    Details: See above  External Data Reviewed: notes.    Details: Previous specialist notes reviewed  Labs: ordered.    Details: Urine is positive for UTI  Risk Prescription drug  management. Risk Details: Lidocaine  to groin for temporary relief, will start antibiotics for UTI.  Follow up with gynecology for ongoing care.       Final diagnoses:  Acute cystitis without hematuria  Vaginal pain  No signs of systemic illness or infection. The patient is nontoxic-appearing on exam and vital signs are within normal limits.  I have reviewed the triage vital signs and the nursing notes. Pertinent labs & imaging results that were available during my care of the patient were reviewed by me and considered in my medical decision making (see chart for details). After history, exam, and medical workup I feel the patient has been appropriately medically screened and is safe for discharge home. Pertinent diagnoses were discussed with the patient. Patient was given return precautions.     ED Discharge Orders          Ordered    cephALEXin  (KEFLEX ) 500 MG capsule  2 times daily        01/26/24 0351               Vollie Brunty, MD 01/26/24 0405

## 2024-01-26 NOTE — ED Triage Notes (Signed)
 Pt arrived via EMS from [facility] w/ c/o of vaginal pain that has been happening for a couple of weeks. Pt states intensifies w/ urination. Stated that urine has been tested recently but came back negative.

## 2024-01-28 ENCOUNTER — Non-Acute Institutional Stay (SKILLED_NURSING_FACILITY): Payer: Self-pay | Admitting: Orthopedic Surgery

## 2024-01-28 ENCOUNTER — Encounter: Payer: Self-pay | Admitting: Orthopedic Surgery

## 2024-01-28 DIAGNOSIS — N3000 Acute cystitis without hematuria: Secondary | ICD-10-CM | POA: Diagnosis not present

## 2024-01-28 DIAGNOSIS — N761 Subacute and chronic vaginitis: Secondary | ICD-10-CM | POA: Diagnosis not present

## 2024-01-28 MED ORDER — CEPHALEXIN 500 MG PO CAPS: 500.0000 mg | ORAL_CAPSULE | Freq: Two times a day (BID) | ORAL | Status: AC

## 2024-01-28 NOTE — Progress Notes (Signed)
 Location:  [facility] Room Number: 26/A Place of Service:  SNF [phone]) Provider:  Greig FORBES Cluster, NP   Charlanne Fredia CROME, MD  Patient Care Team: Charlanne Fredia CROME, MD as PCP - General (Internal Medicine) Octavia Charleston, MD (Ophthalmology) Gail Favorite, MD as Consulting Physician (General Surgery) Trixie File, MD as Consulting Physician (Internal Medicine)  Extended Emergency Contact Information Primary Emergency Contact: [name],[name] Address: [address]          [place] [address] [place] Home Phone: [phone] Mobile Phone: [phone] Relation: Spouse  Code Status:  DNR Goals of care: Advanced Directive information    01/26/2024    1:51 AM  Advanced Directives  Does Patient Have a Medical Advance Directive? No  Would patient like information on creating a medical advance directive? No - Patient declined     Chief Complaint  Patient presents with   Acute Visit    ED follow up    HPI:  Pt is a 83 y.o. female seen today for acute visit due to ED follow up.   She currently resides on the skilled nursing unit at [facility]. PMH: HTN, HLD, GERD, IBS, T2DM, polyneuropathy, DJD, osteopenia, Factor 5 mutation, breast cancer s/p  left lumpectomy 1996, incontinence, and anxiety.   07/19 she presented to the ED due to ongoing vaginal pain. Urinalysis suggestive of UTI> + blood and leukocytes. Urine culture not ordered. Symptoms resolved with lidocaine  jelly. She was discharged back to [facility] with Keflex .   Today, she reports improved symptoms. She is scheduled to see urogynecologist 08/29. Remains on estradiol  vaginal suppositories and diflucan .      Past Medical History:  Diagnosis Date   Allergy    Fleeta Smock; allergy shots weekly.     Anxiety    Arthritis    DDD lumbar spine.     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   Past Surgical History:  Procedure Laterality Date   BREAST LUMPECTOMY  1996   L breast cancer   BREAST SURGERY N/A    [employer] 12/12/2019   EYE SURGERY N/A    [employer] 12/12/2019   MASTECTOMY  2002   Bilateral for breast cancer   VAGINAL HYSTERECTOMY  1985   DUB; uterine fibroids; ovaries intact.    Allergies  Allergen Reactions   Fruit & Vegetable Daily [Nutritional Supplements] Diarrhea   Mixed Ragweed    Onion Other (See Comments)    indigestion   Tree Extract Swelling   Sulfa Drugs Cross Reactors Rash    All over the body    Outpatient Encounter Medications as of 01/28/2024  Medication Sig   acetaminophen  (TYLENOL ) 500 MG tablet Take 1,000 mg by mouth every 12 (twelve) hours as needed.   acetaminophen  (TYLENOL ) 500 MG tablet Take 1,000 mg by mouth every evening.   albuterol  (VENTOLIN  HFA) 108 (90 Base) MCG/ACT inhaler Inhale 2 puffs into the lungs every 6 (six) hours as needed for wheezing or shortness of breath.   antiseptic oral rinse (BIOTENE) LIQD 30 mLs by Mouth Rinse route 2 (two) times daily. For dry mouth   aspirin 81 MG chewable tablet Chew  by mouth daily.   cephALEXin  (KEFLEX ) 500 MG capsule Take 1 capsule (500 mg total) by mouth 2 (two) times daily.   Continuous Glucose Sensor (DEXCOM G7 SENSOR) MISC by Does not apply route. 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 Document site where new sensor is placed.   Estradiol  (IMVEXXY  MAINTENANCE PACK) 4 MCG INST Place 1 suppository vaginally 2 (two) times a week.   fluconazole  (DIFLUCAN ) 150 MG tablet Take 150 mg by mouth 2 (two) times a week. Tuesday for redness on  vaginal area.   fluticasone  (FLONASE ) 50 MCG/ACT nasal spray Place 1 spray into both nostrils as needed for allergies or rhinitis.   gabapentin  (NEURONTIN ) 100 MG capsule Take 200 mg by mouth at bedtime.   insulin aspart (NOVOLOG) 100 UNIT/ML injection Inject 5 Units into the skin 2 (two) times daily. 8 units if CBG more then 200 12 units if CBG more then 300   insulin glargine (LANTUS) 100 UNIT/ML injection Inject 42 Units into the skin every morning.   lidocaine  4 % Place 1 patch onto the skin daily. Apply to lower back topically every 24 hours as needed for pain every day PRN   lisinopril  (ZESTRIL ) 2.5 MG tablet Take 2.5 mg by mouth in the morning.   loperamide (IMODIUM A-D) 2 MG tablet Take 2 mg by mouth every 12 (twelve) hours as needed for diarrhea or loose stools.   loratadine (CLARITIN) 10 MG tablet Take 10 mg by mouth every morning.   Melatonin 5 MG CAPS Take 1 capsule by mouth daily. As needed for insomnia   memantine (NAMENDA) 5 MG tablet Take 5 mg by mouth at bedtime.   metFORMIN  (GLUCOPHAGE ) 500 MG tablet Take 250 mg by mouth every evening.   metFORMIN  (GLUCOPHAGE ) 500 MG tablet Take 500 mg by mouth every morning.   metoprolol  succinate (TOPROL -XL) 100 MG 24 hr tablet Take 100 mg by mouth 2 (two) times daily. Take with or immediately following a meal.   omeprazole  (PRILOSEC) 20 MG capsule Take 1 capsule (20 mg total) by mouth daily for 7 days.   ondansetron  (ZOFRAN ) 4 MG tablet Take 1 tablet (4 mg total) by mouth every 8 (eight) hours as needed for nausea or vomiting.   polyethylene glycol (MIRALAX / GLYCOLAX) 17 g packet Take 17 g by mouth as needed. Give 1 scoop by mouth every 24 hours as needed for constipation   pravastatin  (PRAVACHOL ) 20 MG tablet Take 20 mg by mouth every evening.   senna-docusate (SENOKOT-S) 8.6-50 MG tablet Take 2 tablets by mouth daily. Give 2 tablets by mouth in the evening for constipation   sertraline  (ZOLOFT ) 50 MG tablet Take 1 tablet (50 mg total) by  mouth daily.   sitaGLIPtin  (JANUVIA ) 50 MG tablet Take 1 tablet (50 mg total) by mouth daily.   zinc oxide 20 % ointment Apply 1 application  topically as needed (To buttocks after every incontinent epsiode and as needed for redness).   No facility-administered encounter medications on file as of 01/28/2024.    Review of Systems  Constitutional:  Negative for fatigue and fever.  Respiratory:  Negative for shortness of breath.   Cardiovascular:  Negative for chest pain.  Gastrointestinal:  Negative for abdominal pain and constipation.  Genitourinary:  Positive for dysuria and vaginal pain. Negative for frequency, hematuria, pelvic pain and vaginal bleeding.  Psychiatric/Behavioral:  Positive for confusion and dysphoric mood. Negative for sleep disturbance. The patient is not nervous/anxious.     Immunization History  Administered Date(s) Administered   Fluad Quad(high Dose 65+) 05/03/2022   Influenza Split 03/19/2012, 03/19/2013   Influenza, High Dose Seasonal PF 04/09/2014, 03/22/2015, 03/24/2016, 03/29/2018, 04/16/2020, 05/09/2023   Influenza,inj,Quad PF,6+ Mos 04/09/2014, 03/22/2015, 03/24/2016   Influenza-Unspecified 03/19/2012, 03/19/2013, 04/09/2017, 03/24/2018, 04/16/2020   Moderna Covid-19 Vaccine Bivalent Booster 69yrs & up 05/16/2023   Moderna SARS-COV2 Booster Vaccination 05/16/2022   Moderna Sars-Covid-2 Vaccination 05/19/2021   PFIZER(Purple Top)SARS-COV-2 Vaccination 08/17/2019, 09/10/2019, 04/06/2020, 11/19/2020   Pfizer Covid-19 Vaccine Bivalent Booster 56yrs & up 11/18/2020, 12/21/2021   Pneumococcal Conjugate-13 11/05/2014   Pneumococcal Polysaccharide-23 07/10/2004, 12/12/2010, 05/02/2016   Pneumococcal-Unspecified 07/10/2004, 12/12/2010, 05/02/2016   Respiratory Syncytial Virus Vaccine,Recomb Aduvanted(Arexvy) 06/24/2022   Td 07/10/2009   Tdap 07/06/2022   Zoster Recombinant(Shingrix) 04/09/2017, 07/19/2017   Zoster, Live 07/10/2006   Pertinent  Health  Maintenance Due  Topic Date Due   HEMOGLOBIN A1C  12/11/2023   INFLUENZA VACCINE  02/08/2024   FOOT EXAM  05/13/2024   OPHTHALMOLOGY EXAM  11/11/2024   DEXA SCAN  Completed      02/02/2023    2:47 PM 03/14/2023    3:13 PM 04/18/2023    2:05 PM 05/14/2023   12:34 PM 07/19/2023   10:42 AM  Fall Risk  Falls in the past year? 0 0 0 0 0  Was there an injury with Fall? 0 0 0 0 0  Fall Risk Category Calculator 0 0 0 0 0  Patient at Risk for Falls Due to History of fall(s);Impaired balance/gait;Impaired mobility History of fall(s);Impaired balance/gait History of fall(s);Impaired balance/gait;Impaired mobility History of fall(s);Impaired balance/gait;Impaired mobility History of fall(s);Impaired balance/gait;Impaired mobility  Fall risk Follow up Falls evaluation completed;Education provided;Falls prevention discussed Falls evaluation completed;Education provided  Falls evaluation completed;Education provided;Falls prevention discussed Falls evaluation completed;Education provided;Falls prevention discussed   Functional Status Survey:    Vitals:   01/28/24 1022  BP: 113/71  Pulse: 75  Resp: 16  Temp: 97.8 F (36.6 C)  SpO2: 95%  Weight: 172 lb 1.6 oz (78.1 kg)  Height: 5' 4 (1.626 m)   Body mass index is 29.54 kg/m. Physical Exam Vitals reviewed.  Constitutional:      General: She is not in acute distress. HENT:     Head: Normocephalic.  Eyes:     General:        Right eye: No discharge.        Left eye: No discharge.  Cardiovascular:     Rate and Rhythm: Normal rate and regular rhythm.     Pulses: Normal pulses.     Heart sounds: Normal heart sounds.  Pulmonary:     Effort: Pulmonary effort is normal.     Breath sounds: Normal breath sounds.  Abdominal:     Palpations: Abdomen is soft.  Musculoskeletal:     Cervical back: Neck supple.     Right lower leg: No edema.     Left lower leg: No edema.  Skin:    General: Skin is warm.     Capillary Refill: Capillary refill  takes less than 2 seconds.  Neurological:     General: No focal deficit present.     Mental Status: She is alert.  Psychiatric:        Mood and Affect: Mood normal.     Labs reviewed: Recent Labs    04/30/23 0000 05/03/23 0000  NA 137  138  K 4.1 4.5  CL 108 110*  CO2 17 16  BUN 32* 40*  CREATININE 1.6* 1.4*  CALCIUM  8.5* 8.7   No results for input(s): AST, ALT, ALKPHOS, BILITOT, PROT, ALBUMIN in the last 8760 hours. Recent Labs    04/30/23 0000 05/03/23 0000  WBC 14.4 11.8  NEUTROABS 11,160.00 8,803.00  HGB 11.2* 11.4*  HCT 34* 35*  PLT 328 402*   Lab Results  Component Value Date   TSH 3.100 08/12/2019   Lab Results  Component Value Date   HGBA1C 7.4 06/12/2023   Lab Results  Component Value Date   CHOL 113 06/12/2023   HDL 38 06/12/2023   LDLCALC 53 06/12/2023   TRIG 135 06/12/2023   CHOLHDL 4.4 12/19/2018    Significant Diagnostic Results in last 30 days:  No results found.  Assessment/Plan 1. Acute cystitis without hematuria (Primary) - 07/19 UA + blood and leukocytes in ED - urine culture not ordered - symptoms improved today - cont Keflex  x 7 days - encourage hydration with water  2. Chronic vaginitis - see above - symptoms improved with lidocaine  jelly  - 08/29 scheduled to see urogynecology  - cont estradiol  suppositories and diflucan       Family/ staff Communication: plan discussed with patient and nurse  Labs/tests ordered:  none

## 2024-01-31 LAB — VITAMIN B12: Vitamin B-12: 415

## 2024-02-04 ENCOUNTER — Encounter: Payer: Self-pay | Admitting: Orthopedic Surgery

## 2024-02-04 ENCOUNTER — Encounter: Payer: Self-pay | Admitting: Internal Medicine

## 2024-02-04 ENCOUNTER — Non-Acute Institutional Stay (SKILLED_NURSING_FACILITY): Payer: Self-pay | Admitting: Orthopedic Surgery

## 2024-02-04 DIAGNOSIS — N1832 Chronic kidney disease, stage 3b: Secondary | ICD-10-CM

## 2024-02-04 DIAGNOSIS — E785 Hyperlipidemia, unspecified: Secondary | ICD-10-CM

## 2024-02-04 DIAGNOSIS — N3000 Acute cystitis without hematuria: Secondary | ICD-10-CM | POA: Diagnosis not present

## 2024-02-04 DIAGNOSIS — K649 Unspecified hemorrhoids: Secondary | ICD-10-CM | POA: Diagnosis not present

## 2024-02-04 DIAGNOSIS — F03A18 Unspecified dementia, mild, with other behavioral disturbance: Secondary | ICD-10-CM

## 2024-02-04 DIAGNOSIS — E1169 Type 2 diabetes mellitus with other specified complication: Secondary | ICD-10-CM

## 2024-02-04 DIAGNOSIS — Z794 Long term (current) use of insulin: Secondary | ICD-10-CM

## 2024-02-04 DIAGNOSIS — E1165 Type 2 diabetes mellitus with hyperglycemia: Secondary | ICD-10-CM

## 2024-02-04 DIAGNOSIS — G629 Polyneuropathy, unspecified: Secondary | ICD-10-CM

## 2024-02-04 DIAGNOSIS — F339 Major depressive disorder, recurrent, unspecified: Secondary | ICD-10-CM | POA: Diagnosis not present

## 2024-02-04 DIAGNOSIS — N761 Subacute and chronic vaginitis: Secondary | ICD-10-CM

## 2024-02-04 DIAGNOSIS — I10 Essential (primary) hypertension: Secondary | ICD-10-CM

## 2024-02-04 NOTE — Progress Notes (Signed)
 Location:  [facility] Room Number: 26/A Place of Service:  SNF [phone]) Provider:  Greig FORBES Cluster, NP   Charlanne Fredia CROME, MD  Patient Care Team: Charlanne Fredia CROME, MD as PCP - General (Internal Medicine) Octavia Charleston, MD (Ophthalmology) Gail Favorite, MD as Consulting Physician (General Surgery) Trixie File, MD as Consulting Physician (Internal Medicine)  Extended Emergency Contact Information Primary Emergency Contact: [name],[name] Address: [address]          [place] [address] [place] Home Phone: [phone] Mobile Phone: [phone] Relation: Spouse  Code Status:  DNR Goals of care: Advanced Directive information    01/26/2024    1:51 AM  Advanced Directives  Does Patient Have a Medical Advance Directive? No  Would patient like information on creating a medical advance directive? No - Patient declined     Chief Complaint  Patient presents with   Medical Management of Chronic Issues    HPI:  Pt is a 83 y.o. female seen today for medical management of chronic diseases.    She currently resides on the skilled nursing unit at [facility]. PMH: HTN, HLD, GERD, IBS, T2DM, polyneuropathy, DJD, osteopenia, Factor 5 mutation, breast cancer s/p  left lumpectomy 1996, incontinence, and anxiety.    Followed by palliative. Enrolled with hospice in past.   Acute cystitis- 07/19 presented to the ED for severe vaginal pain> not resolved with pain medication, UA noted ketones, completed Keflex  x 7 days, continues to have intermittent discomfort  Hemorrhoids- began 07/27, improved with Anusol  prn, patient requesting it be scheduled x 7 days Depression- Na+ 135 (04/24), associated with current health state and living in SNF, reports increased depression knowing her house is going to be up for sale> she is agreeable to try increased Zoloft  today Chronic vaginitis- past urology workup found yeast, recently seen by gynecology, vaginal  swab positive for Candida Galabrata> boric acid then Estrace  recommended, reports improved symptoms since starting boric acid, remains on estradiol  suppository 2x/week, weekly diflucan  and Lyrica , scheduled to see Dr. Arnie 03/07/2024 T2DM- A1c 8.9 (04/24)> was 7.4 (12/02), urine microalbumin 03/15/2023> normal, no hypoglycemic events, blood sugars 150-200's, remains on reduced metformin  due to declined renal function, remains on Januvia , Lantus and SSI Dementia-  MMSE 25/30 2021, BIMS score 15/15 (07/02)>was 15/15 (04/03), diagnosed with neurodegenerative disorder per neurology, ambulates with w/c, continues to have behaviors towards staff/husband, remains on Namenda HTN- BUN/creat 31/1.23 11/01/2023, remains on metoprolol  and lisinopril  HLD- total 113, LDL 53 (12/02), on pravastatin  CKD- GFR 44 (04/24)> was 32 (10/22) Peripheral neuropathy- remains on gabapentin   Recent weights:  07/02- 172 lbs  06/02- 177 lbs  05/01- 170 lbs  Recent blood pressures:  07/22- 154/77  07/15- 113/71  07/08- 144/72    Past Medical History:  Diagnosis Date   Allergy    Fleeta Smock; allergy shots weekly.     Anxiety    Arthritis    DDD lumbar spine.     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   Past Surgical History:  Procedure Laterality Date   BREAST LUMPECTOMY  1996   L breast cancer   BREAST SURGERY N/A  [employer]reesia 12/12/2019   EYE SURGERY N/A    [employer]reesia 12/12/2019   MASTECTOMY  2002   Bilateral for breast cancer   VAGINAL HYSTERECTOMY  1985   DUB; uterine fibroids; ovaries intact.    Allergies  Allergen Reactions   Fruit & Vegetable Daily [Nutritional Supplements] Diarrhea   Mixed Ragweed    Onion Other (See  Comments)    indigestion   Tree Extract Swelling   Sulfa Drugs Cross Reactors Rash    All over the body    Outpatient Encounter Medications as of 02/04/2024  Medication Sig   acetaminophen  (TYLENOL ) 500 MG tablet Take 1,000 mg by mouth every 12 (twelve) hours as needed.   acetaminophen  (TYLENOL ) 500 MG tablet Take 1,000 mg by mouth every evening.   albuterol  (VENTOLIN  HFA) 108 (90 Base) MCG/ACT inhaler Inhale 2 puffs into the lungs every 6 (six) hours as needed for wheezing or shortness of breath.   antiseptic oral rinse (BIOTENE) LIQD 30 mLs by Mouth Rinse route 2 (two) times daily. For dry mouth   aspirin 81 MG chewable tablet Chew by mouth daily.   Continuous Glucose Sensor (DEXCOM G7 SENSOR) MISC by Does not apply route. 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 Document site where new sensor is placed.   Estradiol  (IMVEXXY  MAINTENANCE PACK) 4 MCG INST Place 1 suppository vaginally 2 (two) times a week.   fluconazole  (DIFLUCAN ) 150 MG tablet Take 150 mg by mouth 2 (two) times a week. Tuesday for redness on vaginal area.   fluticasone  (FLONASE ) 50 MCG/ACT nasal spray Place 1 spray into both nostrils as needed for allergies or rhinitis.   gabapentin  (NEURONTIN ) 100 MG capsule Take 200 mg by mouth at bedtime.   insulin aspart (NOVOLOG) 100 UNIT/ML injection Inject 5 Units into the skin 2 (two) times daily. 8 units if CBG more then 200 12 units if CBG more then 300   insulin glargine (LANTUS) 100 UNIT/ML injection Inject 42 Units into the skin every morning.   lidocaine  4 % Place 1 patch onto the skin daily. Apply to lower back topically every 24 hours as needed for pain every day PRN   lisinopril  (ZESTRIL ) 2.5 MG tablet Take 2.5 mg by mouth in the morning.   loperamide (IMODIUM A-D) 2 MG tablet Take 2 mg by mouth every  12 (twelve) hours as needed for diarrhea or loose stools.   loratadine (CLARITIN) 10 MG tablet Take 10 mg by mouth every morning.   Melatonin 5 MG CAPS Take 1 capsule by mouth daily. As needed for insomnia   memantine (NAMENDA) 5 MG tablet Take 5 mg by mouth at bedtime.   metFORMIN  (GLUCOPHAGE ) 500 MG tablet Take 250 mg by mouth every evening.   metFORMIN  (GLUCOPHAGE ) 500 MG tablet Take 500 mg by mouth every morning.   metoprolol  succinate (TOPROL -XL) 100 MG 24 hr tablet Take 100 mg by mouth 2 (two) times daily. Take with or immediately following a meal.   omeprazole  (PRILOSEC) 20 MG capsule Take 1 capsule (20 mg total) by mouth daily for 7 days.   ondansetron  (ZOFRAN ) 4 MG tablet Take 1 tablet (4 mg total) by mouth every 8 (eight) hours as needed for nausea or vomiting.   polyethylene glycol (MIRALAX / GLYCOLAX) 17 g packet Take 17 g by mouth as needed. Give 1 scoop by mouth every 24 hours  as needed for constipation   pravastatin  (PRAVACHOL ) 20 MG tablet Take 20 mg by mouth every evening.   senna-docusate (SENOKOT-S) 8.6-50 MG tablet Take 2 tablets by mouth daily. Give 2 tablets by mouth in the evening for constipation   sertraline  (ZOLOFT ) 50 MG tablet Take 1 tablet (50 mg total) by mouth daily.   sitaGLIPtin  (JANUVIA ) 50 MG tablet Take 1 tablet (50 mg total) by mouth daily.   zinc oxide 20 % ointment Apply 1 application  topically as needed (To buttocks after every incontinent epsiode and as needed for redness).   No facility-administered encounter medications on file as of 02/04/2024.    Review of Systems  Constitutional:  Negative for fatigue and fever.  HENT:  Negative for sore throat and trouble swallowing.   Eyes:  Negative for visual disturbance.  Respiratory:  Negative for cough and shortness of breath.   Cardiovascular:  Negative for chest pain and leg swelling.  Gastrointestinal:  Positive for rectal pain. Negative for abdominal distention, abdominal pain, anal bleeding and  constipation.  Genitourinary:  Positive for dysuria and vaginal pain. Negative for frequency, hematuria, vaginal bleeding and vaginal discharge.  Musculoskeletal:  Positive for gait problem.  Skin:  Negative for wound.  Neurological:  Positive for weakness. Negative for dizziness and headaches.  Psychiatric/Behavioral:  Positive for confusion and dysphoric mood. Negative for sleep disturbance. The patient is not nervous/anxious.     Immunization History  Administered Date(s) Administered   Fluad Quad(high Dose 65+) 05/03/2022   Influenza Split 03/19/2012, 03/19/2013   Influenza, High Dose Seasonal PF 04/09/2014, 03/22/2015, 03/24/2016, 03/29/2018, 04/16/2020, 05/09/2023   Influenza,inj,Quad PF,6+ Mos 04/09/2014, 03/22/2015, 03/24/2016   Influenza-Unspecified 03/19/2012, 03/19/2013, 04/09/2017, 03/24/2018, 04/16/2020   Moderna Covid-19 Vaccine Bivalent Booster 63yrs & up 05/16/2023   Moderna SARS-COV2 Booster Vaccination 05/16/2022   Moderna Sars-Covid-2 Vaccination 05/19/2021   PFIZER(Purple Top)SARS-COV-2 Vaccination 08/17/2019, 09/10/2019, 04/06/2020, 11/19/2020   Pfizer Covid-19 Vaccine Bivalent Booster 61yrs & up 11/18/2020, 12/21/2021   Pneumococcal Conjugate-13 11/05/2014   Pneumococcal Polysaccharide-23 07/10/2004, 12/12/2010, 05/02/2016   Pneumococcal-Unspecified 07/10/2004, 12/12/2010, 05/02/2016   Respiratory Syncytial Virus Vaccine,Recomb Aduvanted(Arexvy) 06/24/2022   Td 07/10/2009   Tdap 07/06/2022   Zoster Recombinant(Shingrix) 04/09/2017, 07/19/2017   Zoster, Live 07/10/2006   Pertinent  Health Maintenance Due  Topic Date Due   HEMOGLOBIN A1C  12/11/2023   INFLUENZA VACCINE  02/08/2024   FOOT EXAM  05/13/2024   OPHTHALMOLOGY EXAM  11/11/2024   DEXA SCAN  Completed      02/02/2023    2:47 PM 03/14/2023    3:13 PM 04/18/2023    2:05 PM 05/14/2023   12:34 PM 07/19/2023   10:42 AM  Fall Risk  Falls in the past year? 0 0 0 0 0  Was there an injury with Fall? 0 0 0 0 0   Fall Risk Category Calculator 0 0 0 0 0  Patient at Risk for Falls Due to History of fall(s);Impaired balance/gait;Impaired mobility History of fall(s);Impaired balance/gait History of fall(s);Impaired balance/gait;Impaired mobility History of fall(s);Impaired balance/gait;Impaired mobility History of fall(s);Impaired balance/gait;Impaired mobility  Fall risk Follow up Falls evaluation completed;Education provided;Falls prevention discussed Falls evaluation completed;Education provided  Falls evaluation completed;Education provided;Falls prevention discussed Falls evaluation completed;Education provided;Falls prevention discussed   Functional Status Survey:    Vitals:   02/04/24 1408  BP: (!) 154/77  Pulse: 77  Resp: 18  Temp: 98.4 F (36.9 C)  SpO2: 97%  Weight: 172 lb 1.6 oz (78.1 kg)  Height: 5' 4 (1.626 m)  Body mass index is 29.54 kg/m. Physical Exam Vitals reviewed.  Constitutional:      General: She is not in acute distress. HENT:     Head: Normocephalic.  Eyes:     General:        Right eye: No discharge.        Left eye: No discharge.  Cardiovascular:     Rate and Rhythm: Normal rate and regular rhythm.     Pulses: Normal pulses.     Heart sounds: Normal heart sounds.  Pulmonary:     Effort: Pulmonary effort is normal.     Breath sounds: Normal breath sounds.  Abdominal:     General: Bowel sounds are normal. There is no distension.     Palpations: Abdomen is soft.     Tenderness: There is no abdominal tenderness.  Musculoskeletal:     Cervical back: Neck supple.     Right lower leg: No edema.     Left lower leg: No edema.  Skin:    General: Skin is warm.     Capillary Refill: Capillary refill takes less than 2 seconds.  Neurological:     General: No focal deficit present.     Mental Status: She is alert and oriented to person, place, and time.     Motor: Weakness present.     Gait: Gait abnormal.  Psychiatric:        Mood and Affect: Mood normal.      Labs reviewed: Recent Labs    04/30/23 0000 05/03/23 0000  NA 137 138  K 4.1 4.5  CL 108 110*  CO2 17 16  BUN 32* 40*  CREATININE 1.6* 1.4*  CALCIUM  8.5* 8.7   No results for input(s): AST, ALT, ALKPHOS, BILITOT, PROT, ALBUMIN in the last 8760 hours. Recent Labs    04/30/23 0000 05/03/23 0000  WBC 14.4 11.8  NEUTROABS 11,160.00 8,803.00  HGB 11.2* 11.4*  HCT 34* 35*  PLT 328 402*   Lab Results  Component Value Date   TSH 3.100 08/12/2019   Lab Results  Component Value Date   HGBA1C 7.4 06/12/2023   Lab Results  Component Value Date   CHOL 113 06/12/2023   HDL 38 06/12/2023   LDLCALC 53 06/12/2023   TRIG 135 06/12/2023   CHOLHDL 4.4 12/19/2018    Significant Diagnostic Results in last 30 days:  No results found.  Assessment/Plan 1. Acute cystitis without hematuria (Primary) - 07/19 ED due to severe dysuria> not relieved by pain medication - UA done only> + leukocytes - prescribed Keflex  x 7 days> completed - no future episodes of severe dysuria per patient   2. Hemorrhoids, unspecified hemorrhoid type - began 07/27 - start Anusol  BID x 7 days, then continue prn orders  3. Recurrent depression (HCC) - reports worsening depression today> admits sale of house as reason  - declined Zoloft  increases in past> agreeable to try increased dose 75 mg today - bmp 08/07  4. Chronic vaginitis - ongoing - 08/29 scheduled to see urogynecologist - cont estradiol  vaginal applications 2x/week - cont diflucan  once weekly - cont Lyrica    5. Type 2 diabetes mellitus with hyperglycemia, with long-term current use of insulin (HCC) - A1c 8.9 10/2023 - Dexcon sugars 150-200's - cont Lantus, SSI and januvia  - A1c and urine microalbumin 08/07  6. Mild dementia with other behavioral disturbance, unspecified dementia type (HCC) - recent BIMS 15/15 01/09/2024 - continues to have periods of agitation and poor memory - weight stable -  dependent with  ADLs - cont skilled nursing  7. Essential hypertension - controlled with lisinopril   8. Hyperlipidemia associated with type 2 diabetes mellitus (HCC) - LDL 53 06/2023 - cont pravastatin   9. Stage 3b chronic kidney disease (HCC) - encourage hydration with water - avoid NSAIDS  10. Peripheral polyneuropathy - cont gabapentin      Family/ staff Communication: plan discussed with patient and nurse  Labs/tests ordered:  A1c and bmp 08/07, urogynecology scheduled 03/07/2024

## 2024-02-15 ENCOUNTER — Encounter: Payer: Self-pay | Admitting: Orthopedic Surgery

## 2024-02-15 ENCOUNTER — Non-Acute Institutional Stay (SKILLED_NURSING_FACILITY): Payer: Self-pay | Admitting: Orthopedic Surgery

## 2024-02-15 DIAGNOSIS — N761 Subacute and chronic vaginitis: Secondary | ICD-10-CM | POA: Diagnosis not present

## 2024-02-15 MED ORDER — LIDOCAINE HCL URETHRAL/MUCOSAL 2 % EX GEL: 1.0000 | Freq: Two times a day (BID) | CUTANEOUS | Status: DC | PRN

## 2024-02-15 NOTE — Progress Notes (Signed)
 Location:  [facility] Room Number: 26/A Place of Service:  SNF [phone]) Provider:  Greig FORBES Cluster, NP   Charlanne Fredia CROME, MD  Patient Care Team: Charlanne Fredia CROME, MD as PCP - General (Internal Medicine) Octavia Charleston, MD (Ophthalmology) Gail Favorite, MD as Consulting Physician (General Surgery) Trixie File, MD as Consulting Physician (Internal Medicine)  Extended Emergency Contact Information Primary Emergency Contact: [name],[name] Address: [address]          [place] [address] [place] Home Phone: [phone] Mobile Phone: [phone] Relation: Spouse  Code Status:  DNR Goals of care: Advanced Directive information    01/26/2024    1:51 AM  Advanced Directives  Does Patient Have a Medical Advance Directive? No  Would patient like information on creating a medical advance directive? No - Patient declined     Chief Complaint  Patient presents with   Acute Visit    Vaginal pain    HPI:  Pt is a 83 y.o. female seen today for acute visit due to ongoing vaginal pain.   She currently resides on the skilled nursing unit at [facility] PMH: HTN, HLD, GERD, IBS, T2DM, polyneuropathy, DJD, osteopenia, Factor 5 mutation, breast cancer s/p  left lumpectomy 1996, incontinence, and anxiety.   She has been having episodes of severe vaginal pain x 2 weeks. 07/19 she had ED evaluation due to severe vaginal pain unresolved with tylenol . Symptoms improved with lidocaine  jelly. She was also prescribed Keflex  x 7 days due to abnormal UA, urine culture not ordered. H/o chronic vaginitis > 18 months. Past urology workup found yeast. She recently saw gynecology and vaginal swab indicated Candida Galabrata. She was treated with boric acid and Estrace  and symptoms improved for awhile. Urogynecology referral was recommended> scheduled with Dr. Marilynne 08/29. Unsuccessful trial Lyrica . Yesterday she had shooting pain to vaginal folds lasting about  30 minutes. She was given tylenol  and repositioned in bed. Vaseline was applied to vaginal folds. Weekly diflucan  was given 1 day early. Pain did improve. Vaginal folds continue to appear red and inflamed. Nursing reports poor hygiene to peri area. She refuses shower at times and nursing assistance with hygiene. She is also incontinent. She refuses to just wear brief during the day and uses pads with briefs. Afebrile. Vitals stable.     Past Medical History:  Diagnosis Date   Allergy    Fleeta Smock; allergy shots weekly.     Anxiety    Arthritis    DDD lumbar spine.     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   Past Surgical History:  Procedure Laterality Date   BREAST LUMPECTOMY  1996   L breast cancer   BREAST SURGERY N/A    [employer] 12/12/2019   EYE SURGERY N/A    [employer] 12/12/2019   MASTECTOMY  2002   Bilateral for breast cancer   VAGINAL HYSTERECTOMY  1985   DUB; uterine fibroids; ovaries intact.    Allergies  Allergen Reactions   Fruit & Vegetable Daily [Nutritional Supplements] Diarrhea   Mixed Ragweed    Onion Other (See Comments)    indigestion   Tree Extract Swelling  Sulfa Drugs Cross Reactors Rash    All over the body    Outpatient Encounter Medications as of 02/15/2024  Medication Sig   acetaminophen  (TYLENOL ) 500 MG tablet Take 1,000 mg by mouth every 12 (twelve) hours as needed.   acetaminophen  (TYLENOL ) 500 MG tablet Take 1,000 mg by mouth every evening.   albuterol  (VENTOLIN  HFA) 108 (90 Base) MCG/ACT inhaler Inhale 2 puffs into the lungs every 6 (six) hours as needed for wheezing or shortness of breath.   antiseptic oral rinse (BIOTENE) LIQD 30 mLs by Mouth Rinse route 2 (two) times  daily. For dry mouth   aspirin 81 MG chewable tablet Chew by mouth daily.   Continuous Glucose Sensor (DEXCOM G7 SENSOR) MISC by Does not apply route. 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 Document site where new sensor is placed.   Estradiol  (IMVEXXY  MAINTENANCE PACK) 4 MCG INST Place 1 suppository vaginally 2 (two) times a week.   fluconazole  (DIFLUCAN ) 150 MG tablet Take 150 mg by mouth 2 (two) times a week. Tuesday for redness on vaginal area.   fluticasone  (FLONASE ) 50 MCG/ACT nasal spray Place 1 spray into both nostrils as needed for allergies or rhinitis.   gabapentin  (NEURONTIN ) 100 MG capsule Take 200 mg by mouth at bedtime.   insulin aspart (NOVOLOG) 100 UNIT/ML injection Inject 5 Units into the skin 2 (two) times daily. 8 units if CBG more then 200 12 units if CBG more then 300   insulin glargine (LANTUS) 100 UNIT/ML injection Inject 42 Units into the skin every morning.   lidocaine  4 % Place 1 patch onto the skin daily. Apply to lower back topically every 24 hours as needed for pain every day PRN   lisinopril  (ZESTRIL ) 2.5 MG tablet Take 2.5 mg by mouth in the morning.   loperamide (IMODIUM A-D) 2 MG tablet Take 2 mg by mouth every 12 (twelve) hours as needed for diarrhea or loose stools.   loratadine (CLARITIN) 10 MG tablet Take 10 mg by mouth every morning.   Melatonin 5 MG CAPS Take 1 capsule by mouth daily. As needed for insomnia   memantine (NAMENDA) 5 MG tablet Take 5 mg by mouth at bedtime.   metFORMIN  (GLUCOPHAGE ) 500 MG tablet Take 250 mg by mouth every evening.   metFORMIN  (GLUCOPHAGE ) 500 MG tablet Take 500 mg by mouth every morning.   metoprolol  succinate (TOPROL -XL) 100 MG 24 hr tablet Take 100 mg by mouth 2 (two) times daily. Take with or immediately following a meal.   ondansetron  (ZOFRAN ) 4  MG tablet Take 1 tablet (4 mg total) by mouth every 8 (eight) hours as needed for nausea or vomiting.   polyethylene glycol (MIRALAX / GLYCOLAX) 17 g packet Take 17 g by mouth as needed. Give 1 scoop by mouth every 24 hours as needed for constipation   pravastatin  (PRAVACHOL ) 20 MG tablet Take 20 mg by mouth every evening.   senna-docusate (SENOKOT-S) 8.6-50 MG tablet Take 2 tablets by mouth daily. Give 2 tablets by mouth in the evening for constipation   sertraline  (ZOLOFT ) 50 MG tablet Take 1 tablet (50 mg total) by mouth daily.   sitaGLIPtin  (JANUVIA ) 50 MG tablet Take 1 tablet (50 mg total) by mouth daily.   zinc oxide 20 % ointment Apply 1 application  topically as needed (To buttocks after every incontinent epsiode  and as needed for redness).   No facility-administered encounter medications on file as of 02/15/2024.    Review of Systems  Respiratory: Negative.    Cardiovascular: Negative.   Genitourinary:  Positive for dysuria, pelvic pain and vaginal pain.  Psychiatric/Behavioral:  Positive for confusion and dysphoric mood. Negative for sleep disturbance. The patient is not nervous/anxious.     Immunization History  Administered Date(s) Administered   Fluad Quad(high Dose 65+) 05/03/2022   Influenza Split 03/19/2012, 03/19/2013   Influenza, High Dose Seasonal PF 04/09/2014, 03/22/2015, 03/24/2016, 03/29/2018, 04/16/2020, 05/09/2023   Influenza,inj,Quad PF,6+ Mos 04/09/2014, 03/22/2015, 03/24/2016   Influenza-Unspecified 03/19/2012, 03/19/2013, 04/09/2017, 03/24/2018, 04/16/2020   Moderna Covid-19 Vaccine Bivalent Booster 109yrs & up 05/16/2023   Moderna SARS-COV2 Booster Vaccination 05/16/2022   Moderna Sars-Covid-2 Vaccination 05/19/2021   PFIZER(Purple Top)SARS-COV-2 Vaccination 08/17/2019, 09/10/2019, 04/06/2020, 11/19/2020   Pfizer Covid-19 Vaccine Bivalent Booster 34yrs & up 11/18/2020, 12/21/2021   Pneumococcal Conjugate-13 11/05/2014   Pneumococcal Polysaccharide-23  07/10/2004, 12/12/2010, 05/02/2016   Pneumococcal-Unspecified 07/10/2004, 12/12/2010, 05/02/2016   Respiratory Syncytial Virus Vaccine,Recomb Aduvanted(Arexvy) 06/24/2022   Td 07/10/2009   Tdap 07/06/2022   Zoster Recombinant(Shingrix) 04/09/2017, 07/19/2017   Zoster, Live 07/10/2006   Pertinent  Health Maintenance Due  Topic Date Due   HEMOGLOBIN A1C  12/11/2023   INFLUENZA VACCINE  02/08/2024   FOOT EXAM  05/13/2024   OPHTHALMOLOGY EXAM  11/11/2024   DEXA SCAN  Completed      02/02/2023    2:47 PM 03/14/2023    3:13 PM 04/18/2023    2:05 PM 05/14/2023   12:34 PM 07/19/2023   10:42 AM  Fall Risk  Falls in the past year? 0 0 0 0 0  Was there an injury with Fall? 0 0 0 0 0  Fall Risk Category Calculator 0 0 0 0 0  Patient at Risk for Falls Due to History of fall(s);Impaired balance/gait;Impaired mobility History of fall(s);Impaired balance/gait History of fall(s);Impaired balance/gait;Impaired mobility History of fall(s);Impaired balance/gait;Impaired mobility History of fall(s);Impaired balance/gait;Impaired mobility  Fall risk Follow up Falls evaluation completed;Education provided;Falls prevention discussed Falls evaluation completed;Education provided  Falls evaluation completed;Education provided;Falls prevention discussed Falls evaluation completed;Education provided;Falls prevention discussed   Functional Status Survey:    Vitals:   02/15/24 1424  BP: 127/70  Pulse: 66  Resp: 20  Temp: 97.9 F (36.6 C)  SpO2: 98%  Weight: 172 lb 12.8 oz (78.4 kg)  Height: 5' 4 (1.626 m)   Body mass index is 29.66 kg/m. Physical Exam Vitals reviewed. Exam conducted with a chaperone present.  Constitutional:      General: She is not in acute distress. HENT:     Head: Normocephalic.  Eyes:     General:        Right eye: No discharge.        Left eye: No discharge.  Cardiovascular:     Rate and Rhythm: Normal rate and regular rhythm.     Pulses: Normal pulses.     Heart sounds:  Normal heart sounds.  Pulmonary:     Effort: Pulmonary effort is normal.     Breath sounds: Normal breath sounds.  Abdominal:     General: Bowel sounds are normal.     Palpations: Abdomen is soft.  Genitourinary:    Comments: Inner vaginal folds with increased redness and mild swelling, no discharge present Musculoskeletal:     Cervical back: Neck supple.     Right lower leg: No edema.     Left lower leg: No  edema.  Skin:    General: Skin is warm.     Capillary Refill: Capillary refill takes less than 2 seconds.  Neurological:     General: No focal deficit present.     Mental Status: She is alert. Mental status is at baseline.     Gait: Gait abnormal.  Psychiatric:        Mood and Affect: Mood normal.     Labs reviewed: Recent Labs    04/30/23 0000 05/03/23 0000  NA 137 138  K 4.1 4.5  CL 108 110*  CO2 17 16  BUN 32* 40*  CREATININE 1.6* 1.4*  CALCIUM  8.5* 8.7   No results for input(s): AST, ALT, ALKPHOS, BILITOT, PROT, ALBUMIN in the last 8760 hours. Recent Labs    04/30/23 0000 05/03/23 0000  WBC 14.4 11.8  NEUTROABS 11,160.00 8,803.00  HGB 11.2* 11.4*  HCT 34* 35*  PLT 328 402*   Lab Results  Component Value Date   TSH 3.100 08/12/2019   Lab Results  Component Value Date   HGBA1C 7.4 06/12/2023   Lab Results  Component Value Date   CHOL 113 06/12/2023   HDL 38 06/12/2023   LDLCALC 53 06/12/2023   TRIG 135 06/12/2023   CHOLHDL 4.4 12/19/2018    Significant Diagnostic Results in last 30 days:  No results found.  Assessment/Plan: 1. Chronic vaginitis (Primary) - ongoing - now having episodes of severe pain - vaginal folds red and slightly inflamed - nursing reports poor hygiene> refusing showers and pericare assistance - using pads and briefs  - 08/29 scheduled to see urogynecology - cont diflucan  once weekly  - start lidocaine  2% jelly to vaginal folds BID PRN      Family/ staff Communication: plan discussed with patient  and nurse  Labs/tests ordered:  none

## 2024-02-29 ENCOUNTER — Encounter: Payer: Self-pay | Admitting: Orthopedic Surgery

## 2024-02-29 ENCOUNTER — Non-Acute Institutional Stay (INDEPENDENT_AMBULATORY_CARE_PROVIDER_SITE_OTHER): Payer: Self-pay | Admitting: Orthopedic Surgery

## 2024-02-29 DIAGNOSIS — Z0000 Encounter for general adult medical examination without abnormal findings: Secondary | ICD-10-CM | POA: Diagnosis not present

## 2024-02-29 NOTE — Progress Notes (Signed)
 Subjective:   [name] [name] is a 83 y.o. female who presents for Medicare Annual (Subsequent) preventive examination.  Visit Complete: In person  Patient Medicare AWV questionnaire was completed by the patient on 02/29/2024; I have confirmed that all information answered by patient is correct and no changes since this date.  Cardiac Risk Factors include: advanced age (>56men, >18 women);diabetes mellitus;sedentary lifestyle;hypertension;dyslipidemia     Objective:    Today's Vitals   02/29/24 1154  BP: (!) 171/87  Pulse: 74  Resp: 19  Temp: 97.9 F (36.6 C)  SpO2: 96%  Weight: 172 lb 12.8 oz (78.4 kg)  Height: 5' 4 (1.626 m)   Body mass index is 29.66 kg/m.     01/26/2024    1:51 AM 08/13/2023   10:29 AM 06/12/2023   10:17 AM 05/25/2023    3:50 PM 05/14/2023    9:23 AM 05/01/2023    3:54 PM 03/14/2023   10:04 AM  Advanced Directives  Does Patient Have a Medical Advance Directive? No Yes Yes Yes Yes Yes Yes  Type of Furniture conservator/restorer;Living will;Out of facility DNR (pink MOST or yellow form) Healthcare Power of [place];Living will;Out of facility DNR (pink MOST or yellow form) Healthcare Power of [place];Living will;Out of facility DNR (pink MOST or yellow form) Healthcare Power of [place];Living will;Out of facility DNR (pink MOST or yellow form) Healthcare Power of [place];Out of facility DNR (pink MOST or yellow form);Living will Healthcare Power of [place];Living will;Out of facility DNR (pink MOST or yellow form)  Does patient want to make changes to medical advance directive?  No - Patient declined No - Patient declined No - Patient declined No - Patient declined No - Patient declined No - Patient declined  Copy of Healthcare Power of Attorney in Chart?  No - copy requested Yes - validated most recent copy scanned in chart (See row information) Yes - validated most recent copy scanned in chart (See row information) Yes - validated most  recent copy scanned in chart (See row information) Yes - validated most recent copy scanned in chart (See row information) Yes - validated most recent copy scanned in chart (See row information)  Would patient like information on creating a medical advance directive? No - Patient declined        Pre-existing out of facility DNR order (yellow form or pink MOST form)   Pink MOST/Yellow Form most recent copy in chart - Physician notified to receive inpatient order        Current Medications (verified) Outpatient Encounter Medications as of 02/29/2024  Medication Sig   acetaminophen  (TYLENOL ) 500 MG tablet Take 1,000 mg by mouth every 12 (twelve) hours as needed.   acetaminophen  (TYLENOL ) 500 MG tablet Take 1,000 mg by mouth every evening.   albuterol  (VENTOLIN  HFA) 108 (90 Base) MCG/ACT inhaler Inhale 2 puffs into the lungs every 6 (six) hours as needed for wheezing or shortness of breath.   antiseptic oral rinse (BIOTENE) LIQD 30 mLs by Mouth Rinse route 2 (two) times daily. For dry mouth   aspirin 81 MG chewable tablet Chew by mouth daily.   Continuous Glucose Sensor (DEXCOM G7 SENSOR) MISC by Does not apply route. 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 Document site where new sensor is placed.   Estradiol  (IMVEXXY  MAINTENANCE PACK) 4 MCG INST  Place 1 suppository vaginally 2 (two) times a week.   fluconazole  (DIFLUCAN ) 150 MG tablet Take 150 mg by mouth 2 (two) times a week. Tuesday for redness on vaginal area.   fluticasone  (FLONASE ) 50 MCG/ACT nasal spray Place 1 spray into both nostrils as needed for allergies or rhinitis.   gabapentin  (NEURONTIN ) 100 MG capsule Take 200 mg by mouth at bedtime.   insulin aspart (NOVOLOG) 100 UNIT/ML injection Inject 5 Units into the skin 2 (two) times daily. 8 units if CBG more then  200 12 units if CBG more then 300   insulin glargine (LANTUS) 100 UNIT/ML injection Inject 42 Units into the skin every morning.   lidocaine  (XYLOCAINE ) 2 % jelly Apply 1 Application topically 2 (two) times daily as needed.   lidocaine  4 % Place 1 patch onto the skin daily. Apply to lower back topically every 24 hours as needed for pain every day PRN   lisinopril  (ZESTRIL ) 2.5 MG tablet Take 2.5 mg by mouth in the morning.   loperamide (IMODIUM A-D) 2 MG tablet Take 2 mg by mouth every 12 (twelve) hours as needed for diarrhea or loose stools.   loratadine (CLARITIN) 10 MG tablet Take 10 mg by mouth every morning.   Melatonin 5 MG CAPS Take 1 capsule by mouth daily. As needed for insomnia   memantine (NAMENDA) 5 MG tablet Take 5 mg by mouth at bedtime.   metFORMIN  (GLUCOPHAGE ) 500 MG tablet Take 250 mg by mouth every evening.   metFORMIN  (GLUCOPHAGE ) 500 MG tablet Take 500 mg by mouth every morning.   metoprolol  succinate (TOPROL -XL) 100 MG 24 hr tablet Take 100 mg by mouth 2 (two) times daily. Take with or immediately following a meal.   ondansetron  (ZOFRAN ) 4 MG tablet Take 1 tablet (4 mg total) by mouth every 8 (eight) hours as needed for nausea or vomiting.   polyethylene glycol (MIRALAX / GLYCOLAX) 17 g packet Take 17 g by mouth as needed. Give 1 scoop by mouth every 24 hours as needed for constipation   pravastatin  (PRAVACHOL ) 20 MG tablet Take 20 mg by mouth every evening.   senna-docusate (SENOKOT-S) 8.6-50 MG tablet Take 2 tablets by mouth daily. Give 2 tablets by mouth in the evening for constipation   sertraline  (ZOLOFT ) 50 MG tablet Take 1 tablet (50 mg total) by mouth daily.   sitaGLIPtin  (JANUVIA ) 50 MG tablet Take 1 tablet (50 mg total) by mouth daily.   zinc oxide 20 % ointment Apply 1 application  topically as needed (To buttocks after every incontinent epsiode and as needed for redness).   No facility-administered encounter medications on file as of 02/29/2024.    Allergies  (verified) Fruit & vegetable daily [nutritional supplements], Mixed ragweed, Onion, Tree extract, and Sulfa drugs cross reactors   History: Past Medical History:  Diagnosis Date   Allergy    Fleeta Smock; allergy shots weekly.     Anxiety    Arthritis    DDD lumbar spine.     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   Past Surgical History:  Procedure Laterality Date   BREAST LUMPECTOMY  1996   L breast cancer   BREAST  SURGERY N/A    [facility] 12/12/2019   EYE SURGERY N/A    [facility]ia 12/12/2019   MASTECTOMY  2002   Bilateral for breast cancer   VAGINAL HYSTERECTOMY  1985   DUB; uterine fibroids; ovaries intact.   Family History  Problem Relation Age of Onset   Heart disease Mother        AMI as cause of death age 64.   Heart disease Father        AMI as cause of death.   Heart disease Brother    Social History   Socioeconomic History   Marital status: Married    Spouse name: Marcey   Number of children: 2   Years of education: Not on file   Highest education level: Master's degree (e.g., MA, MS, MEng, MEd, MSW, MBA)  Occupational History   Occupation: retired    Comment: retired  Tobacco Use   Smoking status: Never   Smokeless tobacco: Never  Vaping Use   Vaping status: Never Used  Substance and Sexual Activity   Alcohol use: No    Alcohol/week: 0.0 standard drinks of alcohol    Comment: rare   Drug use: No   Sexual activity: Never  Other Topics Concern   Not on file  Social History Narrative   09/30/19 Marital status: married x 20 years; second marriage; happily married      Children:  2 children; 3 grandchildren; no gg      Lives: with husband      Employment: retired in 2005; 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      Tobacco: never      Alcohol: rarely;       Exercise:  Silver Sneakers; Tai Chi once per week; exercises 3 days per week; balance class      ADLs: independent with ADLs; drives.  Husband does the cleaning and lifting groceries.      Advanced Directives: YES;  FULL CODE no prolonged measures.              Social Drivers of Corporate investment banker Strain: Low Risk  (02/29/2024)   Overall Financial Resource Strain (CARDIA)    Difficulty of Paying Living Expenses: Not hard at all  Food Insecurity: No Food Insecurity (02/29/2024)   Hunger Vital Sign    Worried About Running Out of Food in the Last Year: Never true    Ran Out of Food in the Last Year: Never true  Transportation Needs: No Transportation Needs (02/29/2024)   PRAPARE - Administrator, Civil Service (Medical): No    Lack of Transportation (Non-Medical): No  Physical Activity: Inactive (02/29/2024)   Exercise Vital Sign    Days of Exercise per Week: 0 days    Minutes of Exercise per Session: 0 min  Stress: No Stress Concern Present (02/29/2024)   [employer] of Occupational Health - Occupational Stress Questionnaire    Feeling of Stress: Only a little  Social Connections: Moderately Isolated (02/29/2024)   Social Connection and Isolation Panel    Frequency of Communication with Friends and Family: Three times a week    Frequency of Social Gatherings with Friends and Family: Three times a week    Attends Religious Services: Never    Active Member of Clubs or Organizations: No    Attends Banker Meetings: Not on file    Marital Status: Married    Tobacco Counseling Counseling given: Not Answered   Clinical Intake:  Pre-visit preparation completed: No  Pain : No/denies pain     BMI - recorded: 29.66 Nutritional Status: BMI 25 -29 Overweight Nutritional Risks: None Diabetes: Yes CBG done?: Yes CBG resulted in Enter/ Edit  results?: No Did pt. bring in CBG monitor from home?: Yes  How often do you need to have someone help you when you read instructions, pamphlets, or other written materials from your doctor or pharmacy?: 2 - Rarely What is the last grade level you completed in school?: college         Activities of Daily Living    02/29/2024   12:16 PM  In your present state of health, do you have any difficulty performing the following activities:  Hearing? 0  Vision? 0  Difficulty concentrating or making decisions? 1  Walking or climbing stairs? 1  Dressing or bathing? 1  Doing errands, shopping? 1  Using the Toilet? Y  In the past six months, have you accidently leaked urine? Y  Do you have problems with loss of bowel control? Y  Managing your Medications? Y  Managing your Finances? Y  Housekeeping or managing your Housekeeping? Y    Patient Care Team: Charlanne Fredia CROME, MD as PCP - General (Internal Medicine) Octavia Charleston, MD (Ophthalmology) Gail Favorite, MD as Consulting Physician (General Surgery) Trixie File, MD as Consulting Physician (Internal Medicine)  Indicate any recent Medical Services you may have received from other than [facility] providers in the past year (date may be approximate).     Assessment:   This is a routine wellness examination for Saraiah.  Hearing/Vision screen No results found.   Goals Addressed             This Visit's Progress    Maintain Mobility and Function   Not on track    Evidence-based guidance:  Emphasize the importance of physical activity and aerobic exercise as included in treatment plan; assess barriers to adherence; consider patient's abilities and preferences.  Encourage gradual increase in activity or exercise instead of stopping if pain occurs.  Reinforce individual therapy exercise prescription, such as strengthening, stabilization and stretching programs.  Promote optimal body mechanics to stabilize the spine with lifting and  functional activity.  Encourage activity and mobility modifications to facilitate optimal function, such as using a log roll for bed mobility or dressing from a seated position.  Reinforce individual adaptive equipment recommendations to limit excessive spinal movements, such as a Event organiser.  Assess adequacy of sleep; encourage use of sleep hygiene techniques, such as bedtime routine; use of white noise; dark, cool bedroom; avoiding daytime naps, heavy meals or exercise before bedtime.  Promote positions and modification to optimize sleep and sexual activity; consider pillows or positioning devices to assist in maintaining neutral spine.  Explore options for applying ergonomic principles at work and home, such as frequent position changes, using ergonomically designed equipment and working at optimal height.  Promote modifications to increase comfort with driving such as lumbar support, optimizing seat and steering wheel position, using cruise control and taking frequent rest stops to stretch and walk.   Notes:        Depression Screen    02/29/2024   12:19 PM 09/18/2023    3:40 PM 07/19/2023   10:42 AM 05/14/2023   12:34 PM 04/18/2023    2:03 PM 03/14/2023    3:13 PM 07/31/2022    9:46 AM  PHQ 2/9 Scores  PHQ - 2 Score 2 2 2 2 4 1  0  PHQ-  9 Score 5 4 5 4 7       Fall Risk    02/29/2024   12:20 PM 07/19/2023   10:42 AM 05/14/2023   12:34 PM 04/18/2023    2:05 PM 03/14/2023    3:13 PM  Fall Risk   Falls in the past year? 0 0 0 0 0  Number falls in past yr: 0 0 0 0 0  Injury with Fall? 0 0 0 0 0  Risk for fall due to : History of fall(s);Impaired mobility History of fall(s);Impaired balance/gait;Impaired mobility History of fall(s);Impaired balance/gait;Impaired mobility History of fall(s);Impaired balance/gait;Impaired mobility History of fall(s);Impaired balance/gait  Follow up Falls evaluation completed;Education provided Falls evaluation completed;Education provided;Falls  prevention discussed Falls evaluation completed;Education provided;Falls prevention discussed  Falls evaluation completed;Education provided    MEDICARE RISK AT HOME: Medicare Risk at Home Any stairs in or around the home?: No If so, are there any without handrails?: No Home free of loose throw rugs in walkways, pet beds, electrical cords, etc?: Yes Adequate lighting in your home to reduce risk of falls?: Yes Life alert?: No Use of a cane, walker or w/c?: Yes Grab bars in the bathroom?: Yes Shower chair or bench in shower?: Yes Elevated toilet seat or a handicapped toilet?: Yes  TIMED UP AND GO:  Was the test performed?  No    Cognitive Function:    02/02/2023    2:50 PM 01/20/2022   12:54 PM 09/30/2019    1:24 PM  MMSE - Mini Mental State Exam  Not completed: Refused    Orientation to time  4 5  Orientation to Place  5 4  Registration  3 3  Attention/ Calculation  4 3  Recall  2 3  Language- name 2 objects  2 2  Language- repeat  1 0  Language- follow 3 step command  3 3  Language- read & follow direction  1 1  Write a sentence  1 1  Copy design  1 0  Total score  27 25        02/29/2024   12:03 PM 01/20/2022   12:55 PM 12/25/2019    2:00 PM 05/11/2017    2:43 PM  6CIT Screen  What Year? 0 points 0 points 0 points 0 points  What month? 0 points 0 points 0 points 0 points  What time? 0 points 0 points 0 points 0 points  Count back from 20 0 points 0 points 0 points 0 points  Months in reverse 0 points 0 points 0 points 0 points  Repeat phrase 0 points 0 points 0 points 0 points  Total Score 0 points 0 points 0 points 0 points    Immunizations Immunization History  Administered Date(s) Administered   Fluad Quad(high Dose 65+) 05/03/2022   Influenza Split 03/19/2012, 03/19/2013   Influenza, High Dose Seasonal PF 04/09/2014, 03/22/2015, 03/24/2016, 03/29/2018, 04/16/2020, 05/09/2023   Influenza,inj,Quad PF,6+ Mos 04/09/2014, 03/22/2015, 03/24/2016    Influenza-Unspecified 03/19/2012, 03/19/2013, 04/09/2017, 03/24/2018, 04/16/2020   Moderna Covid-19 Vaccine Bivalent Booster 46yrs & up 05/16/2023   Moderna SARS-COV2 Booster Vaccination 05/16/2022   Moderna Sars-Covid-2 Vaccination 05/19/2021   PFIZER(Purple Top)SARS-COV-2 Vaccination 08/17/2019, 09/10/2019, 04/06/2020, 11/19/2020   Pfizer Covid-19 Vaccine Bivalent Booster 36yrs & up 11/18/2020, 12/21/2021   Pneumococcal Conjugate-13 11/05/2014   Pneumococcal Polysaccharide-23 07/10/2004, 12/12/2010, 05/02/2016   Pneumococcal-Unspecified 07/10/2004, 12/12/2010, 05/02/2016   Respiratory Syncytial Virus Vaccine,Recomb Aduvanted(Arexvy) 06/24/2022   Td 07/10/2009   Tdap 07/06/2022   Zoster Recombinant(Shingrix) 04/09/2017, 07/19/2017  Zoster, Live 07/10/2006    TDAP status: Up to date  Flu Vaccine status: Due, Education has been provided regarding the importance of this vaccine. Advised may receive this vaccine at local pharmacy or Health Dept. Aware to provide a copy of the vaccination record if obtained from local pharmacy or Health Dept. Verbalized acceptance and understanding.  Pneumococcal vaccine status: Up to date  Covid-19 vaccine status: Completed vaccines  Qualifies for Shingles Vaccine? Yes   Zostavax completed Yes   Shingrix Completed?: Yes  Screening Tests Health Maintenance  Topic Date Due   Diabetic kidney evaluation - Urine ACR  05/17/2019   COVID-19 Vaccine (9 - 2024-25 season) 07/11/2023   HEMOGLOBIN A1C  12/11/2023   INFLUENZA VACCINE  02/08/2024   Diabetic kidney evaluation - eGFR measurement  05/02/2024   FOOT EXAM  05/13/2024   OPHTHALMOLOGY EXAM  11/11/2024   Medicare Annual Wellness (AWV)  02/28/2025   DTaP/Tdap/Td (3 - Td or Tdap) 07/06/2032   Pneumococcal Vaccine: 50+ Years  Completed   DEXA SCAN  Completed   Zoster Vaccines- Shingrix  Completed   HPV VACCINES  Aged Out   Meningococcal B Vaccine  Aged Out    Health Maintenance  Health  Maintenance Due  Topic Date Due   Diabetic kidney evaluation - Urine ACR  05/17/2019   COVID-19 Vaccine (9 - 2024-25 season) 07/11/2023   HEMOGLOBIN A1C  12/11/2023   INFLUENZA VACCINE  02/08/2024    Colorectal cancer screening: No longer required.   Mammogram status: No longer required due to advanced age.  Bone Density status: Completed 215. Results reflect: Bone density results: OSTEOPENIA. Repeat every no future studies per goals og care/ nonambulatory years.  Lung Cancer Screening: (Low Dose CT Chest recommended if Age 40-80 years, 20 pack-year currently smoking OR have quit w/in 15years.) does not qualify.   Lung Cancer Screening Referral: No  Additional Screening:  Hepatitis C Screening: does not qualify; Completed   Vision Screening: Recommended annual ophthalmology exams for early detection of glaucoma and other disorders of the eye. Is the patient up to date with their annual eye exam?  Yes  Who is the provider or what is the name of the office in which the patient attends annual eye exams? [facility] If pt is not established with a provider, would they like to be referred to a provider to establish care? No .   Dental Screening: Recommended annual dental exams for proper oral hygiene  Diabetic Foot Exam: Diabetic Foot Exam: Completed 02/29/2024  Community Resource Referral / Chronic Care Management: CRR required this visit?  No   CCM required this visit?  No     Plan:     I have personally reviewed and noted the following in the patient's chart:   Medical and social history Use of alcohol, tobacco or illicit drugs  Current medications and supplements including opioid prescriptions. Patient is not currently taking opioid prescriptions. Functional ability and status Nutritional status Physical activity Advanced directives List of other physicians Hospitalizations, surgeries, and ER visits in previous 12 months Vitals Screenings to include cognitive,  depression, and falls Referrals and appointments  In addition, I have reviewed and discussed with patient certain preventive protocols, quality metrics, and best practice recommendations. A written personalized care plan for preventive services as well as general preventive health recommendations were provided to patient.     Greig FORBES Cluster, NP   02/29/2024   After Visit Summary: (MyChart) Due to this being a telephonic visit, the  after visit summary with patients personalized plan was offered to patient via MyChart   Nurse Notes: Lives in skilled nursing facility. 6CIT score 0. UTD on vaccinations. Flu and covid to be administered this Fall 2025 per [facility]. No future mammograms or bone density. Zoloft  increased last month due to increased depression.

## 2024-03-07 ENCOUNTER — Ambulatory Visit (INDEPENDENT_AMBULATORY_CARE_PROVIDER_SITE_OTHER): Admitting: Obstetrics and Gynecology

## 2024-03-07 ENCOUNTER — Other Ambulatory Visit (HOSPITAL_COMMUNITY)
Admission: RE | Admit: 2024-03-07 | Discharge: 2024-03-07 | Disposition: A | Discharge status: Home / Self Care | Source: Ambulatory Visit | Attending: Obstetrics and Gynecology | Admitting: Obstetrics and Gynecology

## 2024-03-07 ENCOUNTER — Other Ambulatory Visit (HOSPITAL_COMMUNITY)
Admission: RE | Admit: 2024-03-07 | Discharge: 2024-03-07 | Disposition: A | Discharge status: Home / Self Care | Source: Other Acute Inpatient Hospital | Attending: Obstetrics and Gynecology | Admitting: Obstetrics and Gynecology

## 2024-03-07 ENCOUNTER — Encounter: Payer: Self-pay | Admitting: Obstetrics and Gynecology

## 2024-03-07 VITALS — BP 131/74 | HR 69

## 2024-03-07 DIAGNOSIS — N3281 Overactive bladder: Secondary | ICD-10-CM | POA: Insufficient documentation

## 2024-03-07 DIAGNOSIS — R159 Full incontinence of feces: Secondary | ICD-10-CM

## 2024-03-07 DIAGNOSIS — N952 Postmenopausal atrophic vaginitis: Secondary | ICD-10-CM | POA: Insufficient documentation

## 2024-03-07 DIAGNOSIS — R82998 Other abnormal findings in urine: Secondary | ICD-10-CM | POA: Diagnosis not present

## 2024-03-07 DIAGNOSIS — N898 Other specified noninflammatory disorders of vagina: Secondary | ICD-10-CM | POA: Diagnosis present

## 2024-03-07 DIAGNOSIS — B3731 Acute candidiasis of vulva and vagina: Secondary | ICD-10-CM

## 2024-03-07 LAB — URINALYSIS, ROUTINE W REFLEX MICROSCOPIC
Bilirubin Urine: NEGATIVE
Glucose, UA: NEGATIVE mg/dL
Ketones, ur: NEGATIVE mg/dL
Nitrite: NEGATIVE
Protein, ur: 30 mg/dL — AB
Specific Gravity, Urine: 1.017 (ref 1.005–1.030)
WBC, UA: >50 WBC/hpf (ref 0–5)
pH: 5 (ref 5.0–8.0)

## 2024-03-07 LAB — POCT URINALYSIS DIP (CLINITEK)
Bilirubin, UA: NEGATIVE
Glucose, UA: NEGATIVE mg/dL
Ketones, POC UA: NEGATIVE mg/dL
Nitrite, UA: NEGATIVE
POC PROTEIN,UA: 30 — AB
Spec Grav, UA: 1.025 (ref 1.010–1.025)
Urobilinogen, UA: 0.2 U/dL
pH, UA: 5.5 (ref 5.0–8.0)

## 2024-03-07 MED ORDER — TROSPIUM CHLORIDE ER 60 MG PO CP24: 1.0000 | ORAL_CAPSULE | Freq: Every day | ORAL | 5 refills | Status: AC

## 2024-03-07 MED ORDER — PSYLLIUM 58.6 % PO PACK: 1.0000 | PACK | Freq: Every day | ORAL | 11 refills | Status: AC

## 2024-03-07 MED ORDER — VORICONAZOLE 200 MG PO TABS: 200.0000 mg | ORAL_TABLET | Freq: Two times a day (BID) | ORAL | 0 refills | Status: AC

## 2024-03-07 NOTE — Assessment & Plan Note (Signed)
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 This is likely the cause of her pain.  - Will treat with oral voriconazole  then weekly boric acid suppositories (OTC). Samples of suppositories provided.

## 2024-03-07 NOTE — Assessment & Plan Note (Signed)
-   Has frequent urine loss, suspect overactive bladder, may have some mixed incontinence - We discussed the symptoms of overactive bladder (OAB), which include urinary urgency, urinary frequency, nocturia, with or without urge incontinence.  While we do not know the exact etiology of OAB, several treatment options exist. We discussed management including behavioral therapy (decreasing bladder irritants, urge suppression strategies, timed voids, bladder retraining), physical therapy, medication. - Prescribed trospium . For anticholinergic medications, we discussed the potential side effects of anticholinergics including dry eyes, dry mouth, constipation, cognitive impairment and urinary retention.

## 2024-03-07 NOTE — Assessment & Plan Note (Signed)
-   Currently using imvexxy  suppositories but could consider estring

## 2024-03-07 NOTE — Assessment & Plan Note (Signed)
-   Recommended daily fiber supplementation to help prevent both constipation and loose stools.

## 2024-03-07 NOTE — Progress Notes (Signed)
 New Patient Evaluation and Consultation  Referring Provider: Jeralyn Crutch, MD PCP: Charlanne Fredia CROME, MD Date of Service: 03/07/2024  SUBJECTIVE Chief Complaint: New Patient (Initial Visit) (Has seen [facility] and then went to ER a month ago )  History of Present Illness: [name] [name] is a 83 y.o. White or Caucasian female seen in consultation at the request of Dr Jeralyn for evaluation of vaginal/ bladder.    Review of records significant for: Prescribed boric acid suppositories and diflucan  due to positive candida glabrata swabs  Urinary Symptoms: Leaks urine with cough/ sneeze and without sensation Unclear how often she is leaking. Pt reports that sometimes pads/ diapers are just soaked, other times they are dry.   Day time voids 2-4.  Nocturia: 0-1 times per night to void. Voiding dysfunction:  does not empty bladder well.  Patient does not use a catheter to empty bladder.  When urinating, patient feels difficulty starting urine stream and the need to urinate multiple times in a row   UTIs: several UTI's in the last year- on further questioning, was treated with cephalexin  for UTI on 7/19 at [facility]. No culture was sent. No other UTIs noted.  Denies history of blood in urine and kidney or bladder stones   Pelvic Organ Prolapse Symptoms:                  Patient Denies a feeling of a bulge the vaginal area.  Bowel Symptom: Bowel movements: 2-3 time(s) per week Stool consistency: soft  Straining: no.  Splinting: no.  Incomplete evacuation: yes.  Patient Admits to accidental bowel leakage / fecal incontinence  Occurs: 1-2 time(s) per week  Consistency with leakage: soft  Bowel regimen: stool softener Uses senokot but husband reports that sometimes she refuses sometimes because she has diarrhea.   Sexual Function Sexually active: no.    Pelvic Pain Denies pelvic pain but has vaginal pain Pain is deeper inside and sharp. Lasts for several minutes  but feels like more of a discomfort after almost all the time.   She uses vaginal suppositories (imvexxy ) twice a week. There have been times the nurse says she cannot insert it.    Past Medical History:  Past Medical History:  Diagnosis Date   Allergy    Fleeta Smock; allergy shots weekly.     Anxiety    Arthritis    DDD lumbar spine.     Cancer (HCC)    breast L x 2; skin basal cell carcinoma [name] [name]).   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     Past Surgical History:   Past Surgical History:  Procedure Laterality Date   BREAST LUMPECTOMY  1996   Left breast cancer   BREAST SURGERY N/A    Phreesia 12/12/2019   EYE SURGERY N/A    Phreesia 12/12/2019   MASTECTOMY  2002   Bilateral for breast cancer   VAGINAL HYSTERECTOMY  1985   DUB; uterine fibroids; ovaries intact.     Past OB/GYN History: OB History  Gravida Para Term Preterm AB Living  2     2  SAB IAB Ectopic Multiple Live Births      2    # Outcome Date GA Lbr Len/2nd Weight Sex Type Anes PTL  Lv  2 Gravida 08/28/75    M Vag-Spont   LIV  1 Gravida 09/14/72     Vag-Spont   LIV    Obstetric Comments  Baby 1 over 9lbs   Bbay 2 right at 7lbs    S/p hysterectomy  Medications: Patient has a current medication list which includes the following prescription(s): acetaminophen , acetaminophen , albuterol , antiseptic oral rinse, aspirin, dexcom g7 sensor, imvexxy  maintenance pack, fluticasone , gabapentin , insulin aspart, insulin glargine, lidocaine , lidocaine , lisinopril , loperamide, loratadine, melatonin, memantine, metformin , metformin , metoprolol  succinate, ondansetron , polyethylene glycol, pravastatin , psyllium, senna-docusate, sertraline , sitagliptin , trospium  chloride, voriconazole , and zinc oxide.   Allergies:  Patient is allergic to fruit & vegetable daily [nutritional supplements], mixed ragweed, onion, tree extract, and sulfa drugs cross reactors.   Social History:  Social History   Tobacco Use   Smoking status: Never   Smokeless tobacco: Never  Vaping Use   Vaping status: Never Used  Substance Use Topics   Alcohol use: No    Alcohol/week: 0.0 standard drinks of alcohol    Comment: rare   Drug use: No    Relationship status: married Patient lives at [facility] Regular exercise: No History of abuse: No  Family History:   Family History  Problem Relation Age of Onset   Heart disease Mother        AMI as cause of death age 86.   Heart disease Father        AMI as cause of death.   Heart disease Brother      Review of Systems: Review of Systems  Constitutional:  Negative for fever, malaise/fatigue and weight loss.  Respiratory:  Negative for cough, shortness of breath and wheezing.   Cardiovascular:  Negative for chest pain, palpitations and leg swelling.  Gastrointestinal:  Positive for blood in stool. Negative for abdominal pain.  Genitourinary:  Positive for dysuria.  Musculoskeletal:  Positive for myalgias.  Skin:  Negative for rash.  Neurological:  Positive for headaches. Negative for dizziness.  Endo/Heme/Allergies:  Bruises/bleeds easily.  Psychiatric/Behavioral:  Positive for depression. The patient is nervous/anxious.      OBJECTIVE Physical Exam: Vitals:   03/07/24 1329  BP: 131/74  Pulse: 69    Physical Exam Vitals reviewed. Exam conducted with a chaperone present.  Constitutional:      General: She is not in acute distress. Pulmonary:     Effort: Pulmonary effort is normal.  Abdominal:     General: There is no distension.     Palpations: Abdomen is soft.     Tenderness: There is no abdominal tenderness. There is no rebound.  Musculoskeletal:        General: No swelling. Normal range of motion.  Skin:    General: Skin is warm and  dry.     Findings: No rash.  Neurological:     Mental Status: She is alert and oriented to person, place, and time.  Psychiatric:        Mood and Affect: Mood normal.        Behavior: Behavior normal.      GU / Detailed Urogynecologic Evaluation:  Pelvic Exam: Extensive erythema and white plaques over labia minora. Tender to palpation.  Aptima swab obtained Bartholin's and Skene's glands normal in appearance; urethral meatus normal in appearance, no urethral masses or discharge.   Urethra was prepped with betadine and straight catheter placed. Clear urine obtained (80ml)  CST: negative  s/p hysterectomy: Speculum exam reveals normal vaginal mucosa with  atrophy and  normal vaginal cuff.  Adnexa no mass, fullness, tenderness.    Pelvic floor strength I/V, puborectalis III/V external anal sphincter III/V  Pelvic floor musculature: Right levator non-tender, Right obturator non-tender, Left levator non-tender, Left obturator non-tender  POP-Q:  - deferred due to discomfort, mild posterior vaginal wall prolapse present that protrudes to -1  Rectal Exam:  Normal sphincter tone, small distal rectocele, enterocoele not present, no rectal masses, no sign of dyssynergia when asking the patient to bear down.  Post-Void Residual (PVR) by Bladder Scan: In order to evaluate bladder emptying, we discussed obtaining a postvoid residual and patient agreed to this procedure.  Procedure: The ultrasound unit was placed on the patient's abdomen in the suprapubic region after the patient had voided.    Post Void Residual - 03/07/24 1356       Post Void Residual   Post Void Residual 80 mL   Pt urinated an hour ago and couldnt leave urine specimen          Laboratory Results: Lab Results  Component Value Date   COLORU yellow 03/07/2024   CLARITYU clear 03/07/2024   GLUCOSEUR negative 03/07/2024   BILIRUBINUR negative 03/07/2024   KETONESU Negative 05/11/2017   SPECGRAV 1.025 03/07/2024    RBCUR small (A) 03/07/2024   PHUR 5.5 03/07/2024   PROTEINUR 30 (A) 01/26/2024   UROBILINOGEN 0.2 03/07/2024   LEUKOCYTESUR Small (1+) (A) 03/07/2024    Lab Results  Component Value Date   CREATININE 1.4 (A) 05/03/2023   CREATININE 1.6 (A) 04/30/2023   CREATININE 1.3 (A) 11/23/2022    Lab Results  Component Value Date   HGBA1C 7.4 06/12/2023    Lab Results  Component Value Date   HGB 11.4 (A) 05/03/2023     ASSESSMENT AND PLAN Ms. Balgobin is a 83 y.o. with:  1. Vaginal candida   2. Overactive bladder   3. Incontinence of feces, unspecified fecal incontinence type   4. Vaginal atrophy   5. Leukocytes in urine   6. 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 This is likely the cause of her pain.  - Will treat with oral voriconazole  then weekly boric acid suppositories (OTC). Samples of suppositories provided.    Orders: -     Voriconazole ; Take 1 tablet (200 mg total) by mouth 2 (two) times daily for 7 days.  Dispense: 14 tablet; Refill: 0  Overactive bladder Assessment & Plan: - Has frequent urine loss, suspect overactive bladder, may have some mixed incontinence - We discussed the symptoms of overactive bladder (OAB), which include urinary urgency, urinary frequency, nocturia, with or without urge incontinence.  While we do not know the exact etiology of OAB, several treatment options exist. We discussed management including behavioral therapy (decreasing bladder irritants, urge suppression strategies, timed voids, bladder retraining), physical therapy, medication. - Prescribed trospium . For anticholinergic medications, we discussed the potential side effects of anticholinergics including dry eyes, dry mouth, constipation, cognitive impairment and urinary retention.    Orders: -     Trospium  Chloride ER;  Take 1 capsule (60 mg total) by mouth daily.  Dispense: 30 capsule; Refill: 5 -     POCT URINALYSIS DIP (CLINITEK) -     Urine Culture; Future  Incontinence of feces, unspecified fecal incontinence type Assessment & Plan: - Recommended daily fiber supplementation to help prevent both constipation  and loose stools.   Orders: -     Psyllium; Take 1 packet by mouth daily.  Dispense: 30 each; Refill: 11  Vaginal atrophy Assessment & Plan: - Currently using imvexxy  suppositories but could consider estring     Leukocytes in urine -     Urine Microscopic; Future  Vaginal discharge -     Cervicovaginal ancillary only   Return 4-6 weeks  Rosaline LOISE Caper, MD

## 2024-03-07 NOTE — Patient Instructions (Addendum)
 Stop weekly diflucan  and start voriconazole  twice a day for 7 days. Then start weekly boric acid suppositories (over the counter) Start Trospium  daily for overactive bladder A urine culture has been sent to rule out urine infection Start psyllium (metamucil) packets daily to help with hard and soft stools and prevent bowel leakage.

## 2024-03-08 LAB — URINE CULTURE: Culture: NO GROWTH

## 2024-03-11 ENCOUNTER — Non-Acute Institutional Stay (SKILLED_NURSING_FACILITY): Payer: Self-pay | Admitting: Adult Health

## 2024-03-11 ENCOUNTER — Ambulatory Visit: Payer: Self-pay | Admitting: Obstetrics and Gynecology

## 2024-03-11 ENCOUNTER — Encounter: Payer: Self-pay | Admitting: Adult Health

## 2024-03-11 DIAGNOSIS — Z66 Do not resuscitate: Secondary | ICD-10-CM | POA: Diagnosis not present

## 2024-03-11 DIAGNOSIS — R3121 Asymptomatic microscopic hematuria: Secondary | ICD-10-CM

## 2024-03-11 DIAGNOSIS — F339 Major depressive disorder, recurrent, unspecified: Secondary | ICD-10-CM | POA: Diagnosis not present

## 2024-03-11 DIAGNOSIS — E1140 Type 2 diabetes mellitus with diabetic neuropathy, unspecified: Secondary | ICD-10-CM

## 2024-03-11 DIAGNOSIS — Z794 Long term (current) use of insulin: Secondary | ICD-10-CM

## 2024-03-11 DIAGNOSIS — F5101 Primary insomnia: Secondary | ICD-10-CM

## 2024-03-11 LAB — CERVICOVAGINAL ANCILLARY ONLY
Bacterial Vaginitis (gardnerella): NEGATIVE
Candida Glabrata: POSITIVE — AB
Candida Vaginitis: NEGATIVE
Comment: NEGATIVE
Comment: NEGATIVE
Comment: NEGATIVE

## 2024-03-11 NOTE — Progress Notes (Deleted)
 Location:  [facility] Room Number: N26-A Place of Service:  SNF (31) Provider:  Phyllis Jereld BROCKS, NP  Patient Care Team: Charlanne Fredia CROME, MD as PCP - General (Internal Medicine) Octavia Charleston, MD (Ophthalmology) Gail Favorite, MD as Consulting Physician (General Surgery) Trixie File, MD as Consulting Physician (Internal Medicine)  Extended Emergency Contact Information Primary Emergency Contact: [name],[name] Address: [address]          [place] [address] [place] Home Phone: [phone] Mobile Phone: [phone] Relation: Spouse  Code Status:  DNR Goals of care: Advanced Directive information    01/26/2024    1:51 AM  Advanced Directives  Does Patient Have a Medical Advance Directive? No  Would patient like information on creating a medical advance directive? No - Patient declined     Chief Complaint  Patient presents with   Insomnia    HPI:  Pt is a 83 y.o. [name] seen today for an acute visit for    Past Medical History:  Diagnosis Date   Allergy    Fleeta Smock; allergy shots weekly.     Anxiety    Arthritis    DDD lumbar spine.     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   Past Surgical History:  Procedure Laterality Date   BREAST LUMPECTOMY  1996   Left breast cancer   BREAST SURGERY N/A    Phreesia 12/12/2019   EYE SURGERY N/A    Phreesia 12/12/2019   MASTECTOMY  2002   Bilateral for breast cancer   VAGINAL HYSTERECTOMY  1985   DUB; uterine fibroids; ovaries intact.    Allergies  Allergen Reactions   Fruit & Vegetable Daily [Nutritional Supplements] Diarrhea   Mixed Ragweed    Onion Other (See Comments)    indigestion    Tree Extract Swelling   Sulfa Drugs Cross Reactors Rash    All over the body    Outpatient Encounter Medications as of 03/11/2024  Medication Sig   acetaminophen  (TYLENOL ) 500 MG tablet Take 1,000 mg by mouth every 12 (twelve) hours as needed.   acetaminophen  (TYLENOL ) 500 MG tablet Take 1,000 mg by mouth every evening.   albuterol  (VENTOLIN  HFA) 108 (90 Base) MCG/ACT inhaler Inhale 2 puffs into the lungs every 6 (six) hours as needed for wheezing or shortness of breath.   antiseptic oral rinse (BIOTENE) LIQD 30 mLs by Mouth Rinse route 2 (two) times daily. For dry mouth   aspirin 81 MG chewable tablet Chew by mouth daily.   Continuous Glucose Sensor (DEXCOM G7 SENSOR) MISC by Does not apply route. 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 Document site where new sensor is placed.   Estradiol  (IMVEXXY  MAINTENANCE PACK) 4 MCG INST Place 1 suppository vaginally 2 (two) times a week.   fluticasone  (FLONASE ) 50 MCG/ACT nasal spray Place 1 spray into both nostrils as needed for allergies or rhinitis.   gabapentin  (NEURONTIN ) 100 MG capsule Take 200 mg by mouth at bedtime.   insulin aspart (NOVOLOG) 100  UNIT/ML injection Inject 5 Units into the skin 2 (two) times daily. 8 units if CBG more then 200 12 units if CBG more then 300   insulin glargine (LANTUS) 100 UNIT/ML injection Inject 42 Units into the skin every morning.   lidocaine  (XYLOCAINE ) 2 % jelly Apply 1 Application topically 2 (two) times daily as needed.   lidocaine  4 % Place 1 patch onto the skin daily. Apply to lower back topically every 24 hours as needed for pain every day PRN   lisinopril  (ZESTRIL ) 2.5 MG tablet Take 2.5 mg by mouth in the morning.   loperamide (IMODIUM A-D) 2 MG tablet Take 2 mg by mouth every 12 (twelve) hours as needed for diarrhea or loose  stools.   loratadine (CLARITIN) 10 MG tablet Take 10 mg by mouth every morning.   Melatonin 5 MG CAPS Take 1 capsule by mouth daily. As needed for insomnia   memantine (NAMENDA) 5 MG tablet Take 5 mg by mouth at bedtime.   metFORMIN  (GLUCOPHAGE ) 500 MG tablet Take 250 mg by mouth every evening.   metFORMIN  (GLUCOPHAGE ) 500 MG tablet Take 500 mg by mouth every morning.   metoprolol  succinate (TOPROL -XL) 100 MG 24 hr tablet Take 100 mg by mouth 2 (two) times daily. Take with or immediately following a meal.   ondansetron  (ZOFRAN ) 4 MG tablet Take 1 tablet (4 mg total) by mouth every 8 (eight) hours as needed for nausea or vomiting.   polyethylene glycol (MIRALAX / GLYCOLAX) 17 g packet Take 17 g by mouth as needed. Give 1 scoop by mouth every 24 hours as needed for constipation   pravastatin  (PRAVACHOL ) 20 MG tablet Take 20 mg by mouth every evening.   psyllium (METAMUCIL) 58.6 % packet Take 1 packet by mouth daily.   senna-docusate (SENOKOT-S) 8.6-50 MG tablet Take 2 tablets by mouth daily. Give 2 tablets by mouth in the evening for constipation   sertraline  (ZOLOFT ) 50 MG tablet Take 1 tablet (50 mg total) by mouth daily.   sitaGLIPtin  (JANUVIA ) 50 MG tablet Take 1 tablet (50 mg total) by mouth daily.   Trospium  Chloride 60 MG CP24 Take 1 capsule (60 mg total) by mouth daily.   voriconazole  (VFEND ) 200 MG tablet Take 1 tablet (200 mg total) by mouth 2 (two) times daily for 7 days.   zinc oxide 20 % ointment Apply 1 application  topically as needed (To buttocks after every incontinent epsiode and as needed for redness).   No facility-administered encounter medications on file as of 03/11/2024.    Review of Systems  Immunization History  Administered Date(s) Administered   Fluad Quad(high Dose 65+) 05/03/2022   INFLUENZA, HIGH DOSE SEASONAL PF 04/09/2014, 03/22/2015, 03/24/2016, 03/29/2018, 04/16/2020, 05/09/2023   Influenza Split 03/19/2012, 03/19/2013   Influenza,inj,Quad PF,6+ Mos  04/09/2014, 03/22/2015, 03/24/2016   Influenza-Unspecified 03/19/2012, 03/19/2013, 04/09/2017, 03/24/2018, 04/16/2020   Moderna Covid-19 Vaccine Bivalent Booster 1yrs & up 05/16/2023   Moderna SARS-COV2 Booster Vaccination 05/16/2022   Moderna Sars-Covid-2 Vaccination 05/19/2021   PFIZER(Purple Top)SARS-COV-2 Vaccination 08/17/2019, 09/10/2019, 04/06/2020, 11/19/2020   Pfizer Covid-19 Vaccine Bivalent Booster 81yrs & up 11/18/2020, 12/21/2021   Pneumococcal Conjugate-13 11/05/2014   Pneumococcal Polysaccharide-23 07/10/2004, 12/12/2010, 05/02/2016   Pneumococcal-Unspecified 07/10/2004, 12/12/2010, 05/02/2016   Respiratory Syncytial Virus Vaccine,Recomb Aduvanted(Arexvy) 06/24/2022   Td 07/10/2009   Tdap 07/06/2022   Zoster Recombinant(Shingrix) 04/09/2017, 07/19/2017   Zoster, Live 07/10/2006   Pertinent  Health Maintenance Due  Topic Date Due   HEMOGLOBIN A1C  12/11/2023   INFLUENZA  VACCINE  02/08/2024   FOOT EXAM  05/13/2024   OPHTHALMOLOGY EXAM  11/11/2024   DEXA SCAN  Completed      03/14/2023    3:13 PM 04/18/2023    2:05 PM 05/14/2023   12:34 PM 07/19/2023   10:42 AM 02/29/2024   12:20 PM  Fall Risk  Falls in the past year? 0 0 0 0 0  Was there an injury with Fall? 0 0 0 0 0  Fall Risk Category Calculator 0 0 0 0 0  Patient at Risk for Falls Due to History of fall(s);Impaired balance/gait History of fall(s);Impaired balance/gait;Impaired mobility History of fall(s);Impaired balance/gait;Impaired mobility History of fall(s);Impaired balance/gait;Impaired mobility History of fall(s);Impaired mobility  Fall risk Follow up Falls evaluation completed;Education provided  Falls evaluation completed;Education provided;Falls prevention discussed Falls evaluation completed;Education provided;Falls prevention discussed Falls evaluation completed;Education provided   Functional Status Survey:    Vitals:   03/11/24 1101 03/11/24 1102  BP: (!) 146/72 (!) 171/87  Pulse: [name]   Temp: (!)  97.3 F (36.3 C)   SpO2: 98%   Weight: 174 lb 3.2 oz (79 kg)   Height: 5' 4 (1.626 m)    Body mass index is 29.9 kg/m. Physical Exam  Labs reviewed: Recent Labs    04/30/23 0000 05/03/23 0000  NA 137 138  K 4.1 4.5  CL 108 110*  CO2 17 16  BUN 32* 40*  CREATININE 1.6* 1.4*  CALCIUM  8.5* 8.7   No results for input(s): AST, ALT, ALKPHOS, BILITOT, PROT, ALBUMIN in the last 8760 hours. Recent Labs    04/30/23 0000 05/03/23 0000  WBC 14.4 11.8  NEUTROABS 11,160.00 8,803.00  HGB 11.2* 11.4*  HCT 34* 35*  PLT 328 402*   Lab Results  Component Value Date   TSH 3.100 08/12/2019   Lab Results  Component Value Date   HGBA1C 7.4 06/12/2023   Lab Results  Component Value Date   CHOL 113 06/12/2023   HDL 38 06/12/2023   LDLCALC 53 06/12/2023   TRIG 135 06/12/2023   CHOLHDL 4.4 12/19/2018    Significant Diagnostic Results in last 30 days:  No results found.  Assessment/Plan 1. DNR (do not resuscitate) (Primary) ***    Family/ staff Communication: ***  Labs/tests ordered:  ***

## 2024-03-11 NOTE — Progress Notes (Signed)
 Location:  [facility] Room Number: N26-A Place of Service:  SNF (31) Provider:  Phyllis Jereld BROCKS, NP  Patient Care Team: Charlanne Fredia CROME, MD as PCP - General (Internal Medicine) Octavia Charleston, MD (Ophthalmology) Gail Favorite, MD as Consulting Physician (General Surgery) Trixie File, MD as Consulting Physician (Internal Medicine)  Extended Emergency Contact Information Primary Emergency Contact: [name],[name] Address: [address]          [place] [address] [place] Home Phone: [phone] Mobile Phone: [phone] Relation: Spouse  Code Status:  DNR Goals of care: Advanced Directive information    01/26/2024    1:51 AM  Advanced Directives  Does Patient Have a Medical Advance Directive? No  Would patient like information on creating a medical advance directive? No - Patient declined     Chief Complaint  Patient presents with   Insomnia    HPI:  Pt is a 83 y.o. female seen today for an acute visit for  insomnia. She is a resident of [id number]. She complained of insomnia for which she takes Melatonin 5 mg at bedtime daily.   She has depression for which she takes Zoloft  75 mg daily. She denies having depressed mood.  She has diabetes and blood sugar this morning was 165. Charge nurse reported changing her Dexcom G7 and was placed on LLQ. She takes Lantus 42 units daily, Januvia  50 mg daily, Metfromin 500 mg in the morning and 250 mg in the evening.   She denies pain on her legs. She take Gabapentin  200 mg at bedtime for neuropathy.      Past Medical History:  Diagnosis Date   Allergy    Fleeta Smock; allergy shots weekly.     Anxiety    Arthritis    DDD lumbar spine.     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   Past Surgical History:  Procedure Laterality Date   BREAST LUMPECTOMY  1996   Left breast cancer   BREAST SURGERY N/A    Phreesia 12/12/2019   EYE SURGERY N/A    Phreesia 12/12/2019   MASTECTOMY  2002   Bilateral for breast cancer   VAGINAL HYSTERECTOMY  1985   DUB; uterine fibroids; ovaries intact.    Allergies  Allergen Reactions   Fruit & Vegetable Daily [Nutritional Supplements] Diarrhea   Mixed Ragweed    Onion Other (See Comments)    indigestion   Tree Extract Swelling   Sulfa Drugs Cross Reactors Rash    All over the body    Outpatient Encounter Medications as of 03/11/2024  Medication Sig   acetaminophen  (TYLENOL ) 500 MG tablet Take 1,000 mg by mouth every 12 (twelve) hours as needed.   acetaminophen  (TYLENOL ) 500 MG tablet Take 1,000 mg by mouth every evening.   albuterol  (VENTOLIN  HFA) 108 (90 Base) MCG/ACT inhaler Inhale 2 puffs into the lungs every 6 (six) hours as needed for wheezing or shortness of breath.   antiseptic oral rinse (BIOTENE) LIQD 30 mLs by Mouth Rinse route 2 (two) times daily. For dry mouth   aspirin 81 MG chewable tablet Chew by mouth daily.   Boric Acid  600 MG SUPP Place 1 suppository vaginally once a week.   calcium  carbonate (TUMS - DOSED IN MG ELEMENTAL CALCIUM ) 500 MG chewable tablet Chew 1 tablet by mouth every 8 (eight) hours as needed for indigestion or heartburn (1-2 tablets).   Continuous Glucose Sensor (DEXCOM G7 SENSOR) MISC by Does not apply route. 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 Document site where new sensor is placed.   Estradiol  (IMVEXXY  MAINTENANCE PACK) 4 MCG INST Place 1 suppository vaginally 2 (two) times a week.   fluticasone  (FLONASE ) 50 MCG/ACT nasal  spray Place 1 spray into both nostrils as needed for allergies or rhinitis.   gabapentin  (NEURONTIN ) 100 MG capsule Take 200 mg by mouth at bedtime.   hydrocortisone  (ANUSOL -HC) 2.5 % rectal cream Place 1 Application rectally 2 (two) times daily.   hydrocortisone  (ANUSOL -HC) 25 MG suppository Place 25 mg rectally as needed for hemorrhoids or anal itching.   insulin aspart (NOVOLOG) 100 UNIT/ML injection Inject 5 Units into the skin 2 (two) times daily. 8 units if CBG more then 200 12 units if CBG more then 300   insulin glargine (LANTUS) 100 UNIT/ML injection Inject 42 Units into the skin every morning.   lidocaine  (XYLOCAINE ) 2 % jelly Apply 1 Application topically 2 (two) times daily as needed.   lidocaine  4 % Place 1 patch onto the skin daily. Apply to lower back topically every 24 hours as needed for pain every day PRN   lisinopril  (ZESTRIL ) 2.5 MG tablet Take 2.5 mg by mouth in the morning.   loperamide (IMODIUM A-D) 2 MG tablet Take 2 mg by mouth every 12 (twelve) hours as needed for diarrhea or loose stools.   loratadine (CLARITIN) 10 MG tablet Take 10 mg by mouth every morning.   Melatonin 5 MG CAPS Take 7.5 mg by mouth daily. As needed for insomnia   memantine (NAMENDA) 5 MG tablet Take 5 mg by mouth at bedtime.   metFORMIN  (GLUCOPHAGE ) 500 MG tablet Take 250 mg by mouth every evening.   metFORMIN  (GLUCOPHAGE ) 500 MG tablet Take 500 mg by mouth every morning.   metoprolol  succinate (TOPROL -XL) 100 MG 24 hr tablet Take 100 mg by mouth 2 (two) times daily. Take with or immediately following a meal.   polyethylene glycol (MIRALAX / GLYCOLAX) 17 g packet Take 17 g by mouth as needed. Give 1 scoop by mouth every 24 hours as needed for constipation   pravastatin  (PRAVACHOL ) 20 MG tablet Take 20 mg by mouth every evening.   psyllium (METAMUCIL) 58.6 % packet Take 1 packet by mouth daily.   senna-docusate (SENOKOT-S) 8.6-50 MG tablet Take 1.5 tablets by mouth daily. For constipation    sertraline  (ZOLOFT ) 25 MG tablet Take 75 mg by mouth daily.   sitaGLIPtin  (JANUVIA ) 50 MG tablet Take 1 tablet (50 mg total) by mouth daily.   Trospium  Chloride 60 MG CP24 Take 1 capsule (60 mg total) by mouth daily.   voriconazole  (VFEND ) 200 MG tablet Take 1 tablet (200 mg total) by mouth 2 (two) times daily for 7 days.   zinc oxide 20 % ointment Apply 1 application  topically as needed (To buttocks after every incontinent epsiode and as needed for redness).   ondansetron  (ZOFRAN ) 4 MG tablet Take 1 tablet (4 mg total) by mouth every 8 (eight) hours as needed for nausea or vomiting. (  Patient not taking: Reported on 03/11/2024)   sertraline  (ZOLOFT ) 50 MG tablet Take 1 tablet (50 mg total) by mouth daily. (Patient not taking: Reported on 03/11/2024)   No facility-administered encounter medications on file as of 03/11/2024.    Review of Systems  Constitutional:  Negative for appetite change, chills, fatigue and fever.  HENT:  Negative for congestion, hearing loss, rhinorrhea and sore throat.   Eyes: Negative.   Respiratory:  Negative for cough, shortness of breath and wheezing.   Cardiovascular:  Negative for chest pain, palpitations and leg swelling.  Gastrointestinal:  Negative for abdominal pain, constipation, diarrhea, nausea and vomiting.  Genitourinary:  Negative for dysuria.  Musculoskeletal:  Negative for arthralgias, back pain and myalgias.  Skin:  Negative for color change, rash and wound.  Neurological:  Negative for dizziness, weakness and headaches.  Psychiatric/Behavioral:  Positive for sleep disturbance. Negative for behavioral problems. The patient is not nervous/anxious.     Immunization History  Administered Date(s) Administered   Fluad Quad(high Dose 65+) 05/03/2022   INFLUENZA, HIGH DOSE SEASONAL PF 04/09/2014, 03/22/2015, 03/24/2016, 03/29/2018, 04/16/2020, 05/09/2023   Influenza Split 03/19/2012, 03/19/2013   Influenza,inj,Quad PF,6+ Mos 04/09/2014, 03/22/2015,  03/24/2016   Influenza-Unspecified 03/19/2012, 03/19/2013, 04/09/2017, 03/24/2018, 04/16/2020   Moderna Covid-19 Vaccine Bivalent Booster 26yrs & up 05/16/2023   Moderna SARS-COV2 Booster Vaccination 05/16/2022   Moderna Sars-Covid-2 Vaccination 05/19/2021   PFIZER(Purple Top)SARS-COV-2 Vaccination 08/17/2019, 09/10/2019, 04/06/2020, 11/19/2020   Pfizer Covid-19 Vaccine Bivalent Booster 78yrs & up 11/18/2020, 12/21/2021   Pneumococcal Conjugate-13 11/05/2014   Pneumococcal Polysaccharide-23 07/10/2004, 12/12/2010, 05/02/2016   Pneumococcal-Unspecified 07/10/2004, 12/12/2010, 05/02/2016   Respiratory Syncytial Virus Vaccine,Recomb Aduvanted(Arexvy) 06/24/2022   Td 07/10/2009   Tdap 07/06/2022   Zoster Recombinant(Shingrix) 04/09/2017, 07/19/2017   Zoster, Live 07/10/2006   Pertinent  Health Maintenance Due  Topic Date Due   HEMOGLOBIN A1C  12/11/2023   INFLUENZA VACCINE  02/08/2024   FOOT EXAM  05/13/2024   OPHTHALMOLOGY EXAM  11/11/2024   DEXA SCAN  Completed      03/14/2023    3:13 PM 04/18/2023    2:05 PM 05/14/2023   12:34 PM 07/19/2023   10:42 AM 02/29/2024   12:20 PM  Fall Risk  Falls in the past year? 0 0 0 0 0  Was there an injury with Fall? 0 0 0 0 0  Fall Risk Category Calculator 0 0 0 0 0  Patient at Risk for Falls Due to History of fall(s);Impaired balance/gait History of fall(s);Impaired balance/gait;Impaired mobility History of fall(s);Impaired balance/gait;Impaired mobility History of fall(s);Impaired balance/gait;Impaired mobility History of fall(s);Impaired mobility  Fall risk Follow up Falls evaluation completed;Education provided  Falls evaluation completed;Education provided;Falls prevention discussed Falls evaluation completed;Education provided;Falls prevention discussed Falls evaluation completed;Education provided   Functional Status Survey:    Vitals:   03/11/24 1101 03/11/24 1102  BP: (!) 146/72 (!) 171/87  Pulse: 66   Temp: (!) 97.3 F (36.3 C)    SpO2: 98%   Weight: 174 lb 3.2 oz (79 kg)   Height: 5' 4 (1.626 m)    Body mass index is 29.9 kg/m. Physical Exam Constitutional:      General: She is not in acute distress.    Appearance: Normal appearance.  HENT:     Head: Normocephalic and atraumatic.     Nose: Nose normal.     Mouth/Throat:     Mouth: Mucous membranes are moist.  Eyes:     Conjunctiva/sclera: Conjunctivae normal.  Cardiovascular:     Rate and Rhythm:  Normal rate and regular rhythm.  Pulmonary:     Effort: Pulmonary effort is normal.     Breath sounds: Normal breath sounds.  Abdominal:     General: Bowel sounds are normal.     Palpations: Abdomen is soft.  Musculoskeletal:        General: Normal range of motion.     Cervical back: Normal range of motion.  Skin:    General: Skin is warm and dry.  Neurological:     General: No focal deficit present.     Mental Status: She is alert and oriented to person, place, and time.  Psychiatric:        Mood and Affect: Mood normal.        Behavior: Behavior normal.        Thought Content: Thought content normal.        Judgment: Judgment normal.     Labs reviewed: Recent Labs    04/30/23 0000 05/03/23 0000  NA 137 138  K 4.1 4.5  CL 108 110*  CO2 17 16  BUN 32* 40*  CREATININE 1.6* 1.4*  CALCIUM  8.5* 8.7   No results for input(s): AST, ALT, ALKPHOS, BILITOT, PROT, ALBUMIN in the last 8760 hours. Recent Labs    04/30/23 0000 05/03/23 0000  WBC 14.4 11.8  NEUTROABS 11,160.00 8,803.00  HGB 11.2* 11.4*  HCT 34* 35*  PLT 328 402*   Lab Results  Component Value Date   TSH 3.100 08/12/2019   Lab Results  Component Value Date   HGBA1C 7.4 06/12/2023   Lab Results  Component Value Date   CHOL 113 06/12/2023   HDL 38 06/12/2023   LDLCALC 53 06/12/2023   TRIG 135 06/12/2023   CHOLHDL 4.4 12/19/2018    Significant Diagnostic Results in last 30 days:  No results found.  Assessment/Plan  1. Primary insomnia (Primary) -   will increase Melatonin 5 mg Q HS to Melatonin 7.5 mg Q HS  2. Recurrent depression (HCC) -  denies having depressed mood -  continue Sertaline 75 mg daily  3. Type 2 diabetes mellitus with diabetic neuropathy, with long-term current use of insulin (HCC) Lab Results  Component Value Date   HGBA1C 7.4 06/12/2023    -  CBGs stable -  continue Lantus 42 units daily -  continue Januvia  50 mg Q AM -  continue Metformin  500 mg Q AM and 250 mg Q evening  4. DNR (do not resuscitate) -  DNR   Family/ staff Communication:  Discussed plan of care with resident and charge nurse.   Labs/tests ordered:  None

## 2024-03-18 ENCOUNTER — Telehealth: Payer: Self-pay | Admitting: Family

## 2024-03-18 NOTE — Telephone Encounter (Signed)
 [facility] facility Nurse called stated patient complained of headache,Tylenol  650 mg tablet was given.Another 1000 mg tablet was already administered during the day but patient stated tylenol  not relieving her headaches. VSS.Orders given for Ibuprofen 400 mg tablet x 1 dose.Please follow up.

## 2024-03-26 ENCOUNTER — Ambulatory Visit (HOSPITAL_COMMUNITY)
Admission: RE | Admit: 2024-03-26 | Discharge: 2024-03-26 | Disposition: A | Discharge status: Home / Self Care | Source: Ambulatory Visit | Attending: Obstetrics and Gynecology | Admitting: Obstetrics and Gynecology

## 2024-03-26 DIAGNOSIS — R3121 Asymptomatic microscopic hematuria: Secondary | ICD-10-CM | POA: Diagnosis present

## 2024-03-31 ENCOUNTER — Ambulatory Visit: Payer: Self-pay | Admitting: Obstetrics

## 2024-04-03 ENCOUNTER — Non-Acute Institutional Stay (SKILLED_NURSING_FACILITY): Payer: Self-pay | Admitting: Internal Medicine

## 2024-04-03 DIAGNOSIS — E785 Hyperlipidemia, unspecified: Secondary | ICD-10-CM

## 2024-04-03 DIAGNOSIS — E1169 Type 2 diabetes mellitus with other specified complication: Secondary | ICD-10-CM

## 2024-04-03 DIAGNOSIS — G629 Polyneuropathy, unspecified: Secondary | ICD-10-CM

## 2024-04-03 DIAGNOSIS — N761 Subacute and chronic vaginitis: Secondary | ICD-10-CM | POA: Diagnosis not present

## 2024-04-03 DIAGNOSIS — N1832 Chronic kidney disease, stage 3b: Secondary | ICD-10-CM

## 2024-04-03 DIAGNOSIS — F5101 Primary insomnia: Secondary | ICD-10-CM

## 2024-04-03 DIAGNOSIS — I10 Essential (primary) hypertension: Secondary | ICD-10-CM

## 2024-04-03 DIAGNOSIS — E1140 Type 2 diabetes mellitus with diabetic neuropathy, unspecified: Secondary | ICD-10-CM

## 2024-04-03 DIAGNOSIS — F03A18 Unspecified dementia, mild, with other behavioral disturbance: Secondary | ICD-10-CM

## 2024-04-03 DIAGNOSIS — Z794 Long term (current) use of insulin: Secondary | ICD-10-CM

## 2024-04-03 DIAGNOSIS — F339 Major depressive disorder, recurrent, unspecified: Secondary | ICD-10-CM | POA: Diagnosis not present

## 2024-04-03 NOTE — Progress Notes (Signed)
 [facility] Service:  SNF (31)  Provider:   Code Status: DNR Goals of Care:     01/26/2024    1:51 AM  Advanced Directives  Does Patient Have a Medical Advance Directive? No  Would patient like information on creating a medical advance directive? No - Patient declined     Chief Complaint  Patient presents with   Care Management    HPI: Patient is a 83 y.o. female seen today for medical management of chronic diseases.    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  Was seen by neurology in March 2021 MRI at that time showed moderate atrophy and chronic microvascular ischemia She is on Namenda low dose  Refuses to take higher dose  Patient continues to need help with transfers with 2 assist.  She can walk with some one assist but only with therapy as per therapist her cognition puts her at higher risk for falls   Weight is stable Wt Readings from Last 3 Encounters:  03/27/24 174 lb (78.9 kg)  03/11/24 174 lb 3.2 oz (79 kg)  02/29/24 172 lb 12.8 oz (78.4 kg)    Past Medical History:  Diagnosis Date   Allergy    Fleeta Smock; allergy shots weekly.     Anxiety    Arthritis    DDD lumbar spine.     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    Past Surgical History:  Procedure Laterality Date   BREAST LUMPECTOMY  1996   Left breast cancer   BREAST SURGERY N/A    Phreesia 12/12/2019   EYE SURGERY N/A    Phreesia 12/12/2019   MASTECTOMY  2002   Bilateral for breast cancer   VAGINAL HYSTERECTOMY  1985   DUB; uterine fibroids; ovaries intact.    Allergies  Allergen Reactions   Fruit & Vegetable Daily [Nutritional Supplements] Diarrhea   Mixed Ragweed    Onion Other (See Comments)    indigestion   Tree Extract Swelling   Sulfa Drugs Cross Reactors Rash    All over the body    Outpatient Encounter Medications as of 04/03/2024  Medication Sig   [DISCONTINUED] sertraline  (ZOLOFT ) 50 MG tablet Take 1 tablet (50 mg total) by mouth daily.   acetaminophen  (TYLENOL ) 500 MG tablet Take 1,000 mg by mouth every 12 (twelve) hours as needed.   acetaminophen  (TYLENOL ) 500 MG tablet Take 1,000 mg by mouth every evening.   albuterol  (VENTOLIN  HFA) 108 (90 Base) MCG/ACT inhaler Inhale 2 puffs into the lungs every 6 (six) hours as needed for wheezing or shortness of breath.   antiseptic oral rinse (BIOTENE) LIQD 30 mLs by Mouth Rinse route 2 (two) times daily. For  dry mouth   aspirin 81 MG chewable tablet Chew by mouth daily.   Boric Acid 600 MG SUPP Place 1 suppository vaginally once a week.   calcium  carbonate (TUMS - DOSED IN MG ELEMENTAL CALCIUM ) 500 MG chewable tablet Chew 1 tablet by mouth every 8 (eight) hours as needed for indigestion or heartburn (1-2 tablets).   Continuous Glucose Sensor (DEXCOM G7 SENSOR) MISC by Does not apply route. 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 Document site where new sensor is placed.   Estradiol  (IMVEXXY  MAINTENANCE PACK) 4 MCG INST Place 1 suppository vaginally 2 (two) times a week.   fluticasone   (FLONASE ) 50 MCG/ACT nasal spray Place 1 spray into both nostrils as needed for allergies or rhinitis.   gabapentin  (NEURONTIN ) 100 MG capsule Take 200 mg by mouth at bedtime.   hydrocortisone  (ANUSOL -HC) 2.5 % rectal cream Place 1 Application rectally 2 (two) times daily.   hydrocortisone  (ANUSOL -HC) 25 MG suppository Place 25 mg rectally as needed for hemorrhoids or anal itching.   insulin aspart (NOVOLOG) 100 UNIT/ML injection Inject into the skin 2 (two) times daily. 8 units if CBG more then 200 12 units if CBG more then 300   insulin glargine (LANTUS) 100 UNIT/ML injection Inject 42 Units into the skin every morning.   lidocaine  (XYLOCAINE ) 2 % jelly Apply 1 Application topically 2 (two) times daily as needed.   lidocaine  4 % Place 1 patch onto the skin daily. Apply to lower back topically every 24 hours as needed for pain every day PRN   lisinopril  (ZESTRIL ) 2.5 MG tablet Take 2.5 mg by mouth in the morning.   loperamide (IMODIUM A-D) 2 MG tablet Take 2 mg by mouth every 12 (twelve) hours as needed for diarrhea or loose stools.   loratadine (CLARITIN) 10 MG tablet Take 10 mg by mouth every morning.   Melatonin 5 MG CAPS Take 7.5 mg by mouth daily. As needed for insomnia   memantine (NAMENDA) 5 MG tablet Take 5 mg by mouth at bedtime.   metFORMIN  (GLUCOPHAGE ) 500 MG tablet Take 250 mg by mouth every evening.   metFORMIN  (GLUCOPHAGE ) 500 MG tablet Take 500 mg by mouth every morning.   metoprolol  succinate (TOPROL -XL) 100 MG 24 hr tablet Take 100 mg by mouth 2 (two) times daily. Take with or immediately following a meal.   ondansetron  (ZOFRAN ) 4 MG tablet Take 1 tablet (4 mg total) by mouth every 8 (eight) hours as needed for nausea or vomiting. (Patient not taking: Reported on 03/11/2024)   polyethylene glycol (MIRALAX / GLYCOLAX) 17 g packet Take 17 g by mouth as needed. Give 1 scoop by mouth every 24 hours as needed for constipation   pravastatin  (PRAVACHOL ) 20 MG tablet Take 20 mg by mouth  every evening.   psyllium (METAMUCIL) 58.6 % packet Take 1 packet by mouth daily.   senna-docusate (SENOKOT-S) 8.6-50 MG tablet Take 1.5 tablets by mouth daily. For constipation   sertraline  (ZOLOFT ) 25 MG tablet Take 75 mg by mouth daily.   sitaGLIPtin  (JANUVIA ) 50 MG tablet Take 1 tablet (50 mg total) by mouth daily.   Trospium  Chloride 60 MG CP24 Take 1 capsule (60 mg total) by mouth daily.   zinc oxide 20 % ointment Apply 1 application  topically as needed (To buttocks after every incontinent epsiode and as needed for redness).  No facility-administered encounter medications on file as of 04/03/2024.    Review of Systems:  Review of Systems  Constitutional:  Negative for activity change and appetite change.  HENT: Negative.    Respiratory:  Negative for cough and shortness of breath.   Cardiovascular:  Negative for leg swelling.  Gastrointestinal:  Negative for constipation.  Genitourinary: Negative.   Musculoskeletal:  Positive for gait problem. Negative for arthralgias and myalgias.  Skin: Negative.   Neurological:  Negative for dizziness and weakness.  Psychiatric/Behavioral:  Positive for confusion, dysphoric mood and sleep disturbance.     Health Maintenance  Topic Date Due   Diabetic kidney evaluation - Urine ACR  05/17/2019   HEMOGLOBIN A1C  12/11/2023   Influenza Vaccine  02/08/2024   COVID-19 Vaccine (9 - 2025-26 season) 03/10/2024   Diabetic kidney evaluation - eGFR measurement  05/02/2024   FOOT EXAM  05/13/2024   OPHTHALMOLOGY EXAM  11/11/2024   Medicare Annual Wellness (AWV)  02/28/2025   DTaP/Tdap/Td (3 - Td or Tdap) 07/06/2032   Pneumococcal Vaccine: 50+ Years  Completed   DEXA SCAN  Completed   Zoster Vaccines- Shingrix  Completed   HPV VACCINES  Aged Out   Meningococcal B Vaccine  Aged Out    Physical Exam: Vitals:   03/27/24 1623  BP: 126/68  Pulse: 77  Resp: 16  Temp: 98 F (36.7 C)  Weight: 174 lb (78.9 kg)   Body mass index is 29.87  kg/m. Physical Exam Vitals reviewed.  Constitutional:      Appearance: Normal appearance.  HENT:     Head: Normocephalic.     Nose: Nose normal.     Mouth/Throat:     Mouth: Mucous membranes are moist.     Pharynx: Oropharynx is clear.  Eyes:     Pupils: Pupils are equal, round, and reactive to light.  Cardiovascular:     Rate and Rhythm: Normal rate and regular rhythm.     Pulses: Normal pulses.     Heart sounds: Normal heart sounds. No murmur heard. Pulmonary:     Effort: Pulmonary effort is normal.     Breath sounds: Normal breath sounds.  Abdominal:     General: Abdomen is flat. Bowel sounds are normal.     Palpations: Abdomen is soft.  Musculoskeletal:        General: No swelling.     Cervical back: Neck supple.  Skin:    General: Skin is warm.  Neurological:     General: No focal deficit present.     Mental Status: She is alert.  Psychiatric:        Mood and Affect: Mood normal.        Thought Content: Thought content normal.     Labs reviewed: Basic Metabolic Panel: Recent Labs    04/30/23 0000 05/03/23 0000  NA 137 138  K 4.1 4.5  CL 108 110*  CO2 17 16  BUN 32* 40*  CREATININE 1.6* 1.4*  CALCIUM  8.5* 8.7   Liver Function Tests: No results for input(s): AST, ALT, ALKPHOS, BILITOT, PROT, ALBUMIN in the last 8760 hours. No results for input(s): LIPASE, AMYLASE in the last 8760 hours. No results for input(s): AMMONIA in the last 8760 hours. CBC: Recent Labs    04/30/23 0000 05/03/23 0000  WBC 14.4 11.8  NEUTROABS 11,160.00 8,803.00  HGB 11.2* 11.4*  HCT 34* 35*  PLT 328 402*   Lipid Panel: Recent Labs    06/12/23 0000  CHOL 113  HDL 38  LDLCALC 53  TRIG 135   Lab Results  Component Value Date   HGBA1C 7.4 06/12/2023    Procedures since last visit: US  RENAL Result Date: 03/31/2024 CLINICAL DATA:  hematuria EXAM: RENAL / URINARY TRACT ULTRASOUND COMPLETE COMPARISON:  06/12/2014 FINDINGS: Right Kidney: Renal  measurements: 8.5 x 4.6 x 4.3 cm = volume: 86 mL.Normal echogenicity. No mass. No hydronephrosis or nephrolithiasis. Left Kidney: Renal measurements: 9.6 x 4.9 x 3.9 cm = volume: 96 mL. Normal echogenicity. No mass. No hydronephrosis or nephrolithiasis. Bladder: Appears normal for degree of bladder distention. Other: None. IMPRESSION: No hydronephrosis or nephrolithiasis. Electronically Signed   By: Rogelia Myers M.D.   On: 03/31/2024 09:01    Assessment/Plan 1. Type 2 diabetes mellitus with diabetic neuropathy, with long-term current use of insulin (HCC) (Primary) A1C in 08/25 was 7.9  2. Recurrent depression Zoloft .   3. Primary insomnia Melatonin just increased  4. Chronic vaginitis Boric Acid weekly Follow up with Urogynecology  5. Mild dementia with other behavioral disturbance, unspecified dementia type (HCC) On Low dose Namenda as she refuses to change it to higher dose  6. Essential hypertension On Lisinopril   7. Hyperlipidemia associated with type 2 diabetes mellitus (HCC) Statin LDL 53 in 12/24  8. Stage 3b chronic kidney disease (HCC) Creat stable  9. Peripheral polyneuropathy Gabapentin     Labs/tests ordered:  * No order type specified * Next appt:  Visit date not found

## 2024-04-06 ENCOUNTER — Encounter: Payer: Self-pay | Admitting: Internal Medicine

## 2024-04-11 ENCOUNTER — Other Ambulatory Visit (HOSPITAL_COMMUNITY)
Admission: RE | Admit: 2024-04-11 | Discharge: 2024-04-11 | Disposition: A | Discharge status: Home / Self Care | Source: Ambulatory Visit | Attending: Obstetrics and Gynecology | Admitting: Obstetrics and Gynecology

## 2024-04-11 ENCOUNTER — Encounter: Payer: Self-pay | Admitting: Obstetrics and Gynecology

## 2024-04-11 ENCOUNTER — Ambulatory Visit: Admitting: Obstetrics and Gynecology

## 2024-04-11 VITALS — BP 131/66 | HR 70

## 2024-04-11 DIAGNOSIS — B379 Candidiasis, unspecified: Secondary | ICD-10-CM | POA: Diagnosis present

## 2024-04-11 DIAGNOSIS — B3731 Acute candidiasis of vulva and vagina: Secondary | ICD-10-CM | POA: Diagnosis present

## 2024-04-11 DIAGNOSIS — N3281 Overactive bladder: Secondary | ICD-10-CM

## 2024-04-11 MED ORDER — VIBEGRON 75 MG PO TABS: 75.0000 mg | ORAL_TABLET | Freq: Every day | ORAL | 5 refills | Status: AC

## 2024-04-11 NOTE — Addendum Note (Signed)
 Addended by: ELANA SOTERO RAMAN on: 04/11/2024 12:19 PM   Modules accepted: Orders

## 2024-04-11 NOTE — Progress Notes (Signed)
 [facility] Return Visit  SUBJECTIVE  History of Present Illness:  Patient presents with her husband  [name] is a 83 y.o. female seen in follow-up for OAB and vaginal irritation. Plan at last visit was start voriconazole  and Trospium  60mg  ER. Patient is still having significant vaginal pain despite completion of the Voriconazole . Patient also reports no change in her bladder symptoms and is still waking up in a pool of urine.    Past Medical History: Patient  has a past medical history of Allergy, Anxiety, Arthritis, Cancer (HCC), Clotting disorder, Diabetes mellitus, Diabetes mellitus without complication (HCC), Hypertension, and IBS (irritable bowel syndrome).   Past Surgical History: She  has a past surgical history that includes Mastectomy (2002); Breast lumpectomy (1996); Vaginal hysterectomy (1985); Breast surgery (N/A); and Eye surgery (N/A).   Medications: She has a current medication list which includes the following prescription(s): acetaminophen , acetaminophen , albuterol , antiseptic oral rinse, aspirin, boric acid, calcium  carbonate, dexcom g7 sensor, imvexxy  maintenance pack, fluticasone , gabapentin , hydrocortisone , hydrocortisone , insulin aspart, insulin glargine, lidocaine , lidocaine , lisinopril , loperamide, loratadine, melatonin, memantine, metformin , metformin , metoprolol  succinate, ondansetron , polyethylene glycol, pravastatin , psyllium, senna-docusate, sertraline , sitagliptin , trospium  chloride, vibegron, and zinc oxide.   Allergies: Patient is allergic to fruit & vegetable daily [nutritional supplements], mixed ragweed, onion, tree extract, and sulfa drugs cross reactors.   Social History: Patient  reports that she has never smoked. She has never used smokeless tobacco. She reports that she does not drink alcohol and does not use drugs.     OBJECTIVE     Physical Exam: Vitals:   04/11/24 1109  BP: 131/66  Pulse: 70   Gen: No apparent  distress, A&O x 3.  Detailed Urogynecologic Evaluation:  Patient has significant irritation at the vaginal opening and deeper in the vagina. Picture attached below.      ASSESSMENT AND PLAN    Ms. Haglund is a 83 y.o. 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 Vaginal candida   3. Overactive bladder    Patient has been attempted to be treated with Diflucan  and Voriconazole . Will start her on nightly boric acid suppositories for 3 weeks. Have also placed an order for an infectious disease consult due to the significant tissue irritation and lack of response to aforementioned treatments.  Patient to see ID for consult.  Will start patient on combination therapy with her Trospium  60mg  ER daily. Will try Gemtesa 75mg  daily as the two medications work differently. Perhaps the use of both a Beta 3 agonist and an anticholinergic will be supportive for her OAB and UUI symptoms.   Patient to follow up in 4 weeks for virtual visit to assess her OAB.   Devrin Monforte G Fable Huisman, NP

## 2024-04-11 NOTE — Patient Instructions (Addendum)
 Nightly boric acid suppositories and per Dr. Marilynne will place an infectious disease consult. They will call to set up an appointment. The vaginal tissues are still extremely irritated. New swab done today.   We will continue the Trospium  and add Gemtesa 75mg  daily to try and see if this will help the overactive spasms that cause her to waken in a pool of urine.

## 2024-04-14 LAB — CERVICOVAGINAL ANCILLARY ONLY
Bacterial Vaginitis (gardnerella): NEGATIVE
Candida Glabrata: POSITIVE — AB
Candida Vaginitis: NEGATIVE
Comment: NEGATIVE
Comment: NEGATIVE
Comment: NEGATIVE

## 2024-04-15 ENCOUNTER — Ambulatory Visit: Payer: Self-pay | Admitting: Obstetrics and Gynecology

## 2024-04-17 ENCOUNTER — Emergency Department (HOSPITAL_COMMUNITY)
Admission: EM | Admit: 2024-04-17 | Discharge: 2024-04-18 | Disposition: A | Discharge status: Home / Self Care | Source: Skilled Nursing Facility | Attending: Emergency Medicine | Admitting: Emergency Medicine

## 2024-04-17 ENCOUNTER — Encounter (HOSPITAL_COMMUNITY): Payer: Self-pay

## 2024-04-17 ENCOUNTER — Other Ambulatory Visit: Payer: Self-pay

## 2024-04-17 DIAGNOSIS — Z7982 Long term (current) use of aspirin: Secondary | ICD-10-CM | POA: Insufficient documentation

## 2024-04-17 DIAGNOSIS — R102 Pelvic and perineal pain unspecified side: Secondary | ICD-10-CM | POA: Diagnosis present

## 2024-04-17 DIAGNOSIS — Z7984 Long term (current) use of oral hypoglycemic drugs: Secondary | ICD-10-CM | POA: Insufficient documentation

## 2024-04-17 DIAGNOSIS — I10 Essential (primary) hypertension: Secondary | ICD-10-CM | POA: Insufficient documentation

## 2024-04-17 DIAGNOSIS — B379 Candidiasis, unspecified: Secondary | ICD-10-CM

## 2024-04-17 DIAGNOSIS — B3731 Acute candidiasis of vulva and vagina: Secondary | ICD-10-CM | POA: Diagnosis not present

## 2024-04-17 DIAGNOSIS — Z79899 Other long term (current) drug therapy: Secondary | ICD-10-CM | POA: Insufficient documentation

## 2024-04-17 DIAGNOSIS — E119 Type 2 diabetes mellitus without complications: Secondary | ICD-10-CM | POA: Insufficient documentation

## 2024-04-17 DIAGNOSIS — Z794 Long term (current) use of insulin: Secondary | ICD-10-CM | POA: Diagnosis not present

## 2024-04-17 DIAGNOSIS — N9089 Other specified noninflammatory disorders of vulva and perineum: Secondary | ICD-10-CM

## 2024-04-17 MED ORDER — LIDOCAINE 5 % EX OINT: 1.0000 | TOPICAL_OINTMENT | CUTANEOUS | 0 refills | Status: AC | PRN

## 2024-04-17 MED ORDER — CLOBETASOL PROPIONATE 0.05 % EX OINT: 1.0000 | TOPICAL_OINTMENT | Freq: Every day | CUTANEOUS | 0 refills | Status: AC

## 2024-04-17 MED ORDER — WHITE PETROLATUM EX OINT: 1.0000 | TOPICAL_OINTMENT | Freq: Every day | CUTANEOUS | 1 refills | Status: AC

## 2024-04-17 MED ORDER — LIDOCAINE 4 % EX CREA: TOPICAL_CREAM | Freq: Once | CUTANEOUS | Status: DC

## 2024-04-17 MED ORDER — CLOBETASOL PROPIONATE 0.05 % EX OINT
TOPICAL_OINTMENT | Freq: Once | CUTANEOUS | Status: AC
  Filled 2024-04-17: qty 15

## 2024-04-17 MED ORDER — MORPHINE SULFATE (PF) 4 MG/ML IV SOLN
4.0000 mg | Freq: Once | INTRAVENOUS | Status: AC
  Administered 2024-04-18: 4 mg via INTRAVENOUS
  Filled 2024-04-17: qty 1

## 2024-04-17 MED ORDER — OXYCODONE HCL 5 MG PO TABS
5.0000 mg | ORAL_TABLET | Freq: Once | ORAL | Status: AC
  Administered 2024-04-17: 5 mg via ORAL
  Filled 2024-04-17: qty 1

## 2024-04-17 MED ORDER — ACETAMINOPHEN 500 MG PO TABS
1000.0000 mg | ORAL_TABLET | Freq: Once | ORAL | Status: AC
Start: 2024-04-17 — End: 2024-04-17
  Administered 2024-04-17: 1000 mg via ORAL
  Filled 2024-04-17: qty 2

## 2024-04-17 NOTE — ED Triage Notes (Addendum)
 Pt BIBA from [facility], c/o vaginal pain from vaginal ulcer for past couple weeks.  Prn meds at facility were not working so family requested her to come out for meds to get her over to her next appointment. VSS.  A&Ox4. Hx of dementia.

## 2024-04-17 NOTE — Consult Note (Signed)
   OB/GYN Telephone Consult  04/17/24  [name] [name] is a 83 y.o. G2P0 postmenopausal female who presents for continued pain and vulvar discomfort-patient [facility] to [facility]  The provider had a clinical question about alternative management options  The provider presented the following relevant clinical information:  Patient has been seen at our outpatient facility by Dr. Marilynne and has been treated for C glabrata with both Diflucan  and now boric suppositories.  She presents for significant vulvar pain and discomfort  -Provider shared that based on picture in 10-3 it is about the same may be slightly better - I had a chance to independently review this picture  I performed a chart review on the patient and reviewed available documentation.  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  -Do not apply the clobetasol and lidocaine  ointment together -Would also recommend oral Benadryl to help with the itch scratch cycle  Patient to continue with treatment of C glabrata.  And to use boric acid suppositories nightly for about 5 days.   -Recommended MD/APP provide the patient with a referral to the Center for [facility] (any office) for follow up in  4 weeks.    []  will send message to office staff regarding follow-up and potential for vulvar biopsy  Thank you for this consult and if additional recommendations are needed please call [phone] for the OB/GYN attending on service at [facility].   I spent  approximately 5 minutes directly consulting with the provider and verbally discussing this case. Additionally 10 minutes minutes was spent performing chart review and documentation.   Ashly Yepez, DO Attending Obstetrician & Gynecologist, [facility] for [employer], [facility]

## 2024-04-17 NOTE — ED Provider Notes (Addendum)
 [name] AT [facility] Provider Note   CSN: [id number] Arrival date & time: 04/17/24  2009     History  Chief Complaint  Patient presents with   Vaginal Pain    [name] [name] is a 83 y.o. female with PMH as listed below who presents BIBA from [name], [name] vaginal pain from vaginal irritation for past couple weeks.  Prn meds at facility were not working so family requested her to come out for meds to get her over to her next appointment. Hx of dementia.   Per chart review from urogynecology note on 04/11/24, patient has OAB and vaginal irritation; had been started on voriconazole  and trospium  60 mg ER but was still having significant vaginal pain despite completion of voriconazole . Exam at that time noted significant vaginal irritation at vaginal introitus and deeper in vagina. Plan noted: Patient has been attempted to be treated with Diflucan  and Voriconazole . Will start her on nightly boric acid suppositories for 3 weeks. Have also placed an order for an infectious disease consult due to the significant tissue irritation and lack of response to aforementioned treatments.  Patient to see ID for consult.  Will start patient on combination therapy with her Trospium  60mg  ER daily. Will try Gemtesa 75mg  daily as the two medications work differently. Perhaps the use of both a Beta 3 agonist and an anticholinergic will be supportive for her OAB and UUI symptoms.    Past Medical History:  Diagnosis Date   Allergy    Fleeta Smock; allergy shots weekly.     Anxiety    Arthritis    DDD lumbar spine.     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       Home Medications Prior to Admission  medications   Medication Sig Start Date End Date Taking? Authorizing Provider  acetaminophen  (TYLENOL ) 500 MG tablet Take 1,000 mg by mouth every 12 (twelve) hours as needed.    [provider]  acetaminophen  (TYLENOL ) 500 MG tablet Take 1,000 mg by mouth every evening.    [provider]  albuterol  (VENTOLIN  HFA) 108 (90 Base) MCG/ACT inhaler Inhale 2 puffs into the lungs every 6 (six) hours as needed for wheezing or shortness of breath. 04/03/22   [name], [name], NP  antiseptic oral rinse (BIOTENE) LIQD 30 mLs by Mouth Rinse route 2 (two) times daily. For dry mouth    [provider]  aspirin 81 MG chewable tablet Chew by mouth daily.    [provider]  Boric Acid 600 MG SUPP Place 1 suppository vaginally once a week.    [provider]  calcium  carbonate (TUMS - DOSED IN MG ELEMENTAL CALCIUM ) 500 MG chewable tablet Chew 1 tablet by mouth every 8 (eight) hours as needed for indigestion or heartburn (1-2 tablets).    [provider]  Continuous Glucose Sensor (DEXCOM G7 SENSOR) MISC by Does not apply route. 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 Document site where new sensor is placed.    [provider]  Estradiol  (IMVEXXY  MAINTENANCE PACK) 4 MCG INST Place 1 suppository vaginally 2 (two) times a week. 09/20/23   [name], [name], MD  fluticasone  (FLONASE ) 50 MCG/ACT nasal spray Place 1 spray into both nostrils as needed for allergies or rhinitis.    [provider]  gabapentin  (NEURONTIN ) 100 MG capsule Take 200 mg by mouth at bedtime.    [provider]  hydrocortisone  (ANUSOL -HC) 2.5 % rectal cream Place 1 Application rectally 2 (two) times daily.    [provider]  hydrocortisone  (ANUSOL -HC) 25 MG suppository Place 25 mg rectally  as needed for hemorrhoids or anal itching.    [provider]  insulin aspart (NOVOLOG) 100 UNIT/ML injection Inject into the skin 2 (two) times daily. 8 units if CBG more then 200 12 units if CBG more then 300    [provider]  insulin glargine (LANTUS) 100 UNIT/ML injection Inject 42 Units into the skin every morning.    [provider]  lidocaine  (XYLOCAINE ) 2 % jelly Apply 1 Application topically 2 (two) times daily as needed. 02/15/24   [name], [name], NP  lidocaine  4 % Place 1 patch onto the skin daily. Apply to lower back topically every 24 hours as needed for pain every day PRN    [provider]  lisinopril  (ZESTRIL ) 2.5 MG tablet Take 2.5 mg by mouth in the morning.    [provider]  loperamide (IMODIUM A-D) 2 MG tablet Take 2 mg by mouth every 12 (twelve) hours as needed for diarrhea or loose stools.    [provider]  loratadine (CLARITIN) 10 MG tablet Take 10 mg by mouth every morning.    [provider]  Melatonin 5 MG CAPS Take 7.5 mg by mouth daily. As needed for insomnia    [provider]  memantine (NAMENDA) 5 MG tablet Take 5 mg by mouth at bedtime.    [provider]  metFORMIN  (GLUCOPHAGE ) 500 MG tablet Take 250 mg by mouth every evening.    [provider]  metFORMIN  (GLUCOPHAGE ) 500 MG tablet Take 500 mg by mouth every morning.    [provider]  metoprolol  succinate (TOPROL -XL) 100 MG 24 hr tablet Take 100 mg by mouth 2 (two) times daily. Take with or immediately following a meal.    [provider]  ondansetron  (ZOFRAN ) 4 MG tablet Take 1 tablet (4 mg total) by mouth every 8 (eight) hours as needed for nausea or vomiting. 12/19/23   [name], [name], NP  polyethylene glycol (MIRALAX / GLYCOLAX) 17 g packet Take 17 g by mouth as needed. Give 1 scoop by mouth every 24 hours as needed for constipation    [provider]  pravastatin  (PRAVACHOL ) 20 MG tablet Take 20  mg by mouth every evening.    [provider]  psyllium (METAMUCIL) 58.6 % packet Take 1 packet by mouth daily. 03/07/24   Marilynne Rosaline SAILOR, MD  senna-docusate (SENOKOT-S) 8.6-50 MG tablet Take 1.5 tablets by mouth daily. For constipation    [provider]  sertraline  (ZOLOFT ) 25 MG tablet Take 75 mg by mouth daily.    [provider]  sitaGLIPtin  (JANUVIA ) 50 MG tablet Take 1 tablet (50 mg total) by mouth daily. 03/21/23   [name], [name], NP  Trospium  Chloride 60 MG CP24 Take 1 capsule (60 mg total) by mouth daily. 03/07/24   Marilynne Rosaline SAILOR, MD  Vibegron 75 MG TABS Take 1 tablet (75 mg total) by  mouth daily. 04/11/24   Zuleta, Kaitlin G, NP  zinc oxide 20 % ointment Apply 1 application  topically as needed (To buttocks after every incontinent epsiode and as needed for redness).    [provider]      Allergies    Fruit & vegetable daily [nutritional supplements], Mixed ragweed, Onion, Tree extract, and Sulfa drugs cross reactors    Review of Systems   Review of Systems A 10 point review of systems was performed and is negative unless otherwise reported in HPI.  Physical Exam Updated Vital Signs BP (!) 140/67 (BP Location: Right Arm)   Pulse 84   Temp 99 F (37.2 C) (Oral)   Resp 15   Ht 5' 4 (1.626 m)   Wt 78.9 kg   SpO2 95%   BMI 29.87 kg/m  Physical Exam General: Elderly female in mild distress, lying in bed.  HEENT: PERRLA, Sclera anicteric, MMM, trachea midline.  Cardiology: RRR Resp: Normal respiratory rate and effort Abd: Soft, non-tender, non-distended. No rebound tenderness or guarding.  GU: Performed with nurse tech chaperone.  Significant vulvar swelling, irritation, and erythema.  No induration or fluctuance.  No crepitus.  Cottage cheese-like discharge at the vaginal introitus.  Complete erosion of the labia minora which are no longer present.  Diaper is wet. MSK: No peripheral edema or signs of trauma. Extremities without  deformity or TTP. No cyanosis or clubbing. Skin: warm, dry.  Neuro: A&Ox3, CNs II-XII grossly intact. MAEs. Sensation grossly intact.  Psych: Irritable mood  ED Results / Procedures / Treatments   Labs (all labs ordered are listed, but only abnormal results are displayed) Labs Reviewed - No data to display  EKG None  Radiology No results found.  Procedures Procedures    Medications Ordered in ED Medications  acetaminophen  (TYLENOL ) tablet 1,000 mg (1,000 mg Oral Given 04/17/24 2138)  oxyCODONE (Oxy IR/ROXICODONE) immediate release tablet 5 mg (5 mg Oral Given 04/17/24 2139)  clobetasol ointment (TEMOVATE) 0.05 % ( Topical Given 04/18/24 0046)  morphine (PF) 4 MG/ML injection 4 mg (4 mg Intravenous Given 04/18/24 0024)    ED Course/ Medical Decision Making/ A&P                          Medical Decision Making Risk OTC drugs. Prescription drug management.    This patient presents to the ED for concern of vaginal pain/irritation, this involves an extensive number of treatment options, and is a complaint that carries with it a high risk of complications and morbidity.  I considered the following differential and admission for this acute, potentially life threatening condition. Patient is overall vitally stable and non-toxic appearing but extremely uncomfortable and crying out in pain.   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  She is followed by gynecology, last seen on 04/11/2024.  She has undergone several treatments and now is currently receiving boric acid suppositories. she is crying out in pain in the room and is in severe pain both in the vulva and inside the vagina.  Per chart review from previous notes and the photos taken from previous visits, she has significant swelling, erythema, irritation of the vulva and introitus and erosion of the anatomy of the labia minora entirely.  From the photo taken at the  gynecology office in the 04/11/2024 note, patient's exam today is a little bit improved but I would say still moderately to  severely inflamed and irritated.  No induration or fluctuance that would indicate cellulitis or an abscess.  She has overactive bladder as well and seems to leak some urine which is probably contributing to the inflammation and discomfort.  I treated her initially with Tylenol  and oxycodone but this was not effective.  I consulted with OB/GYN and discussed extensively with Dr. Ozan, whose consult is greatly appreciated.  Dr. Ozan looked at the images as well and felt like it was possible she could have lichen planus chronicus in addition to her Candida glabrata.  She recommended starting clobetasol ointment daily x 7 days to wean every few days as needed, topical lidocaine  as needed (not applied with the clobetasol), oral Benadryl to help with the itch scratch cycle, and continuing with the boric acid suppositories.  She recommended following up with a [name] and she will send a message to the office staff regarding potential for the need for vulvar biopsy.  I have ordered these medications for the patient.  An IV will be placed and patient will be given IV morphine as her oxycodone and Tylenol  was not effective.  I have also ordered the clobetasol ointment to be applied here if that may help her vulvar irritation while she is here. I discussed all of this with the patient who does report understanding. I attempted to call her husband who is noted in the chart but there was no answer.     Additional history obtained from chart review.    Reevaluation: After the interventions noted above, I reevaluated the patient and found that they have :stayed the same  Social Determinants of Health: Lives at facility  Disposition:  Patient is signed out to the oncoming ED physician Dr. Nettie who is made aware of her history, presentation, exam, workup, and  plan. Plan is to reevaluate after morphine for pain control and if effective will be discharged back to facility with [facility].    Co morbidities that complicate the patient evaluation  Past Medical History:  Diagnosis Date   Allergy    Fleeta Smock; allergy shots weekly.     Anxiety    Arthritis    DDD lumbar spine.     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     Medicines No orders of the defined types were placed in this encounter.   I have reviewed the patients home medicines and have made adjustments as needed  Problem List / ED Course: Problem List Items Addressed This Visit   None Visit Diagnoses       Candida glabrata infection    -  Primary     Vulvar irritation         Vaginal pain                 Franklyn Sid SAILOR, MD 04/27/24 [id number]

## 2024-04-17 NOTE — Discharge Instructions (Addendum)
 Thank you for coming to [facility] Emergency Department. You were seen for vulvar pain. We did an exam and consulted with gynecology. There is some concern that there may be an additional diagnosis of lichen planus in addition to the candidal infection. You may need a vulvar biopsy for help with diagnosis. Please continue the boric acid suppositories and also: -Start clobetasol ointment - apply pea-sized amount daily for about 7 days then wean to every few days then to use as needed  -Can use topical lidocaine  ointment as well for pain control up to 2-3 times per day. Do not apply the clobetasol ointment and lidocaine  ointment together.  -Can also use vaseline ointment as an emollient -Can use oral benadryl to help with the itch-scratch cycle as well  We have provided a referral to the Center for [facility] (any office) for follow up in  4 weeks.   Please follow up with your primary care provider within 1 week.   Do not hesitate to return to the ED or call 911 if you experience: -Worsening symptoms -Lightheadedness, passing out -Fevers/chills -Anything else that concerns you

## 2024-04-17 NOTE — ED Notes (Signed)
 [facility] called

## 2024-04-18 ENCOUNTER — Encounter: Payer: Self-pay | Admitting: Nurse Practitioner

## 2024-04-18 ENCOUNTER — Non-Acute Institutional Stay (SKILLED_NURSING_FACILITY): Payer: Self-pay | Admitting: Nurse Practitioner

## 2024-04-18 DIAGNOSIS — E785 Hyperlipidemia, unspecified: Secondary | ICD-10-CM

## 2024-04-18 DIAGNOSIS — N1832 Chronic kidney disease, stage 3b: Secondary | ICD-10-CM

## 2024-04-18 DIAGNOSIS — F339 Major depressive disorder, recurrent, unspecified: Secondary | ICD-10-CM

## 2024-04-18 DIAGNOSIS — I10 Essential (primary) hypertension: Secondary | ICD-10-CM

## 2024-04-18 DIAGNOSIS — N3281 Overactive bladder: Secondary | ICD-10-CM | POA: Diagnosis not present

## 2024-04-18 DIAGNOSIS — G629 Polyneuropathy, unspecified: Secondary | ICD-10-CM

## 2024-04-18 DIAGNOSIS — F0390 Unspecified dementia without behavioral disturbance: Secondary | ICD-10-CM

## 2024-04-18 DIAGNOSIS — E1165 Type 2 diabetes mellitus with hyperglycemia: Secondary | ICD-10-CM

## 2024-04-18 DIAGNOSIS — B3731 Acute candidiasis of vulva and vagina: Secondary | ICD-10-CM

## 2024-04-18 DIAGNOSIS — N183 Chronic kidney disease, stage 3 unspecified: Secondary | ICD-10-CM | POA: Insufficient documentation

## 2024-04-18 NOTE — Assessment & Plan Note (Signed)
 LDL 54 06/12/23, taking Pravastatin 

## 2024-04-18 NOTE — Assessment & Plan Note (Signed)
 [facility] for care needs. Underwent Neurology eavl 2021, on Namenda.

## 2024-04-18 NOTE — Assessment & Plan Note (Signed)
 Chronic pain/neuropathy, taking Gabapentin .

## 2024-04-18 NOTE — Assessment & Plan Note (Signed)
 Blood pressure is controlled, taking Lisinopril , Metoprolol 

## 2024-04-18 NOTE — Progress Notes (Signed)
 Location:   [facility] Room Number: 57 Place of Service:  SNF (31) Provider: [facility] Rudy Domek NP  Charlanne Fredia CROME, MD  Patient Care Team: Charlanne Fredia CROME, MD as PCP - General (Internal Medicine) Octavia Charleston, MD (Ophthalmology) Gail Favorite, MD as Consulting Physician (General Surgery) Trixie File, MD as Consulting Physician (Internal Medicine)  Extended Emergency Contact Information Primary Emergency Contact: [name],[name] Address: [address]          [place] [address] [place] Home Phone: [phone] Mobile Phone: [phone] Relation: Spouse  Code Status:  DNR Goals of care: Advanced Directive information    04/17/2024    8:20 PM  Advanced Directives  Does Patient Have a Medical Advance Directive? Yes  Type of Advance Directive Out of facility DNR (pink MOST or yellow form)     Chief Complaint  Patient presents with   Acute Visit    F/u ED eval for urogenital/vagina pain.     HPI:  Pt is a 83 y.o. female seen today for an acute visit for ED eval 04/18/24 for urogenital/vaginal pain   Morphine/Oxycodone in ED was effective for pain control. Diagnosed candida infection.   Noted open area left side of the introitus, mild redness urogenital area, no vaginal discharge noted upon my examination today.    Chronic vaginal irritation, treated with Voriconazole , Trospium  by uroGYN, no change of nature of vaginal pain/irritation. Currently on  vaginal Boric acid suppository, Estrogen PV. Started Clobetasol topical in ED   OAB, no change of urinary symptoms, on Trospium .   CKD Bun/creat 40/1.4 05/03/23  HLD LDL 54 06/12/23, taking Pravastatin   T2DM Hgb A1c 7.4 06/12/23, chronic insulin, on Januvia , Metformin   Neurocognitive disorder, SNF [facility] for care needs. Underwent Neurology eavl 2021, on Namenda.    Depression, stable, on Sertraline   HTN, taking Lisinopril , Metoprolol   Chronic pain/neuropathy, taking Gabapentin .    Past Medical  History:  Diagnosis Date   Allergy    Fleeta Smock; allergy shots weekly.     Anxiety    Arthritis    DDD lumbar spine.     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   Past Surgical History:  Procedure Laterality Date   BREAST LUMPECTOMY  1996   Left breast cancer   BREAST SURGERY N/A    [employer] 12/12/2019   EYE SURGERY N/A    [employer] 12/12/2019   MASTECTOMY  2002   Bilateral for breast cancer   VAGINAL HYSTERECTOMY  1985   DUB; uterine fibroids; ovaries intact.    Allergies  Allergen Reactions   Fruit & Vegetable Daily [Nutritional Supplements] Diarrhea   Mixed Ragweed    Onion Other (See Comments)    indigestion   Tree Extract Swelling   Sulfa Drugs Cross Reactors Rash    All over the body    Allergies as of 04/18/2024       Reactions   Fruit & Vegetable Daily [nutritional Supplements] Diarrhea   Mixed Ragweed    Onion Other (See Comments)   indigestion   Tree Extract Swelling   Sulfa Drugs Cross Reactors Rash   All over the body        Medication  List        Accurate as of April 18, 2024 11:59 PM. If you have any questions, ask your nurse or doctor.          acetaminophen  500 MG tablet Commonly known as: TYLENOL  Take 1,000 mg by mouth every 12 (twelve) hours as needed.   acetaminophen  500 MG tablet Commonly known as: TYLENOL  Take 1,000 mg by mouth every evening.   albuterol  108 (90 Base) MCG/ACT inhaler Commonly known as: VENTOLIN  HFA Inhale 2 puffs into the lungs every 6 (six) hours as needed for wheezing or shortness of breath.   antiseptic oral rinse Liqd 30 mLs by Mouth Rinse route 2 (two) times daily. For dry mouth   aspirin 81 MG chewable tablet Chew by mouth daily.    Boric Acid 600 MG Supp Place 1 suppository vaginally once a week.   calcium  carbonate 500 MG chewable tablet Commonly known as: TUMS - dosed in mg elemental calcium  Chew 1 tablet by mouth every 8 (eight) hours as needed for indigestion or heartburn (1-2 tablets).   clobetasol ointment 0.05 % Commonly known as: TEMOVATE Apply 1 Application topically daily. Apply a pea-sized amount to the affected area once daily   Dexcom G7 Sensor Misc by Does not apply route. 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 Document site where new sensor is placed.   fluticasone  50 MCG/ACT nasal spray Commonly known as: FLONASE  Place 1 spray into both nostrils as needed for allergies or rhinitis.   gabapentin  100 MG capsule Commonly known as: NEURONTIN  Take 200 mg by mouth at bedtime.   hydrocortisone  2.5 % rectal cream Commonly known as: ANUSOL -HC Place 1 Application rectally 2 (two) times daily.   hydrocortisone  25 MG suppository Commonly known as: ANUSOL -HC Place 25 mg rectally as needed for hemorrhoids or anal itching.   Imvexxy  Maintenance Pack 4 MCG Inst Generic drug: Estradiol  Place 1 suppository vaginally 2 (two) times a week.   insulin aspart 100 UNIT/ML injection Commonly known as: novoLOG Inject into the skin 2 (two) times daily. 8 units if CBG more then 200 12 units if CBG more then 300   insulin glargine 100 UNIT/ML injection Commonly known as: LANTUS Inject 42 Units into the skin every morning.   lidocaine  2 % jelly Commonly known as: XYLOCAINE  Apply 1 Application topically 2 (two) times daily as needed.   lidocaine  5 % ointment Commonly known as: XYLOCAINE  Apply 1 Application topically as needed for up to 7 days. Can use up to 2-3 times daily for help with pain control of vulva   lisinopril  2.5 MG  tablet Commonly known as: ZESTRIL  Take 2.5 mg by mouth in the morning.   loperamide 2 MG tablet Commonly known as: IMODIUM A-D Take 2 mg by mouth every 12 (twelve) hours as needed for diarrhea or loose stools.   loratadine 10 MG tablet Commonly known as: CLARITIN Take 10 mg by mouth every morning.   Melatonin 5 MG Caps Take 7.5 mg by mouth daily. As needed for insomnia   memantine 5 MG tablet Commonly known as: NAMENDA Take 5 mg by mouth at bedtime.   metFORMIN  500 MG tablet Commonly known as: GLUCOPHAGE  Take 250 mg by mouth every evening.   metFORMIN  500 MG tablet Commonly known as: GLUCOPHAGE  Take 500 mg by mouth every morning.   metoprolol  succinate 100 MG  24 hr tablet Commonly known as: TOPROL -XL Take 100 mg by mouth 2 (two) times daily. Take with or immediately following a meal.   ondansetron  4 MG tablet Commonly known as: Zofran  Take 1 tablet (4 mg total) by mouth every 8 (eight) hours as needed for nausea or vomiting.   polyethylene glycol 17 g packet Commonly known as: MIRALAX / GLYCOLAX Take 17 g by mouth as needed. Give 1 scoop by mouth every 24 hours as needed for constipation   pravastatin  20 MG tablet Commonly known as: PRAVACHOL  Take 20 mg by mouth every evening.   psyllium 58.6 % packet Commonly known as: METAMUCIL Take 1 packet by mouth daily.   senna-docusate 8.6-50 MG tablet Commonly known as: Senokot-S Take 1.5 tablets by mouth daily. For constipation   sertraline  25 MG tablet Commonly known as: ZOLOFT  Take 75 mg by mouth daily.   sitaGLIPtin  50 MG tablet Commonly known as: Januvia  Take 1 tablet (50 mg total) by mouth daily.   Trospium  Chloride 60 MG Cp24 Take 1 capsule (60 mg total) by mouth daily.   Vibegron 75 MG Tabs Take 1 tablet (75 mg total) by mouth daily.   white petrolatum Oint Commonly known as: VASELINE Apply 1 Application topically daily. Apply daily to affected vulvar area as an emollient   zinc oxide 20 %  ointment Apply 1 application  topically as needed (To buttocks after every incontinent epsiode and as needed for redness).        Review of Systems  Constitutional:  Negative for appetite change and fatigue.  HENT:  Negative for congestion and trouble swallowing.   Eyes:  Negative for visual disturbance.  Respiratory:  Negative for cough and shortness of breath.   Cardiovascular:  Negative for leg swelling.  Gastrointestinal:  Negative for abdominal pain and constipation.  Genitourinary:  Positive for genital sores and vaginal pain. Negative for dysuria, hematuria, vaginal bleeding and vaginal discharge.       Incontinent of urine  Musculoskeletal:  Positive for arthralgias and gait problem.  Skin:  Positive for wound.  Neurological:  Negative for speech difficulty, weakness and headaches.  Psychiatric/Behavioral:  Positive for confusion. Negative for sleep disturbance. The patient is not nervous/anxious.     Immunization History  Administered Date(s) Administered   Fluad Quad(high Dose 65+) 05/03/2022   INFLUENZA, HIGH DOSE SEASONAL PF 04/09/2014, 03/22/2015, 03/24/2016, 03/29/2018, 04/16/2020, 05/09/2023   Influenza Split 03/19/2012, 03/19/2013   Influenza,inj,Quad PF,6+ Mos 04/09/2014, 03/22/2015, 03/24/2016   Influenza-Unspecified 03/19/2012, 03/19/2013, 04/09/2017, 03/24/2018, 04/16/2020   Moderna Covid-19 Vaccine Bivalent Booster 31yrs & up 05/16/2023   Moderna SARS-COV2 Booster Vaccination 05/16/2022   Moderna Sars-Covid-2 Vaccination 05/19/2021   PFIZER(Purple Top)SARS-COV-2 Vaccination 08/17/2019, 09/10/2019, 04/06/2020, 11/19/2020   Pfizer Covid-19 Vaccine Bivalent Booster 9yrs & up 11/18/2020, 12/21/2021   Pneumococcal Conjugate-13 11/05/2014   Pneumococcal Polysaccharide-23 07/10/2004, 12/12/2010, 05/02/2016   Pneumococcal-Unspecified 07/10/2004, 12/12/2010, 05/02/2016   Respiratory Syncytial Virus Vaccine,Recomb Aduvanted(Arexvy) 06/24/2022   Td 07/10/2009   Tdap  07/06/2022   Zoster Recombinant(Shingrix) 04/09/2017, 07/19/2017   Zoster, Live 07/10/2006   Pertinent  Health Maintenance Due  Topic Date Due   HEMOGLOBIN A1C  12/11/2023   Influenza Vaccine  02/08/2024   OPHTHALMOLOGY EXAM  04/15/2024   FOOT EXAM  05/13/2024   DEXA SCAN  Completed      03/14/2023    3:13 PM 04/18/2023    2:05 PM 05/14/2023   12:34 PM 07/19/2023   10:42 AM 02/29/2024   12:20 PM  Fall Risk  Falls in the past year? 0 0 0 0 0  Was there an injury with Fall? 0 0 0 0 0  Fall Risk Category Calculator 0 0 0 0 0  Patient at Risk for Falls Due to History of fall(s);Impaired balance/gait History of fall(s);Impaired balance/gait;Impaired mobility History of fall(s);Impaired balance/gait;Impaired mobility History of fall(s);Impaired balance/gait;Impaired mobility History of fall(s);Impaired mobility  Fall risk Follow up Falls evaluation completed;Education provided  Falls evaluation completed;Education provided;Falls prevention discussed Falls evaluation completed;Education provided;Falls prevention discussed Falls evaluation completed;Education provided   Functional Status Survey:    Vitals:   04/18/24 1155  BP: (!) 144/70  Pulse: 77  Resp: 19  Temp: 97.8 F (36.6 C)  SpO2: 93%  Weight: 175 lb 9.6 oz (79.7 kg)   Body mass index is 30.14 kg/m. Physical Exam Vitals and nursing note reviewed.  Constitutional:      Appearance: Normal appearance.  HENT:     Head: Normocephalic and atraumatic.     Nose: Nose normal.     Mouth/Throat:     Mouth: Mucous membranes are moist.  Eyes:     Extraocular Movements: Extraocular movements intact.     Conjunctiva/sclera: Conjunctivae normal.     Pupils: Pupils are equal, round, and reactive to light.  Cardiovascular:     Rate and Rhythm: Normal rate and regular rhythm.     Heart sounds: No murmur heard. Pulmonary:     Effort: Pulmonary effort is normal.     Breath sounds: No wheezing, rhonchi or rales.  Abdominal:      General: Bowel sounds are normal.     Palpations: Abdomen is soft.     Tenderness: There is no abdominal tenderness.  Genitourinary:    Comments: Mild redness in urogenital area, no vaginal discharge or bleed, open area noted left side next to vaginal introitus.  Musculoskeletal:        General: No tenderness. Normal range of motion.     Cervical back: Normal range of motion and neck supple.     Right lower leg: No edema.     Left lower leg: No edema.  Skin:    General: Skin is warm and dry.     Findings: No rash.  Neurological:     General: No focal deficit present.     Mental Status: She is alert and oriented to person, place, and time. Mental status is at baseline.     Motor: No weakness.     Coordination: Coordination normal.     Gait: Gait abnormal.  Psychiatric:        Mood and Affect: Mood normal.        Behavior: Behavior normal.        Thought Content: Thought content normal.        Judgment: Judgment normal.     Labs reviewed: Recent Labs    04/30/23 0000 05/03/23 0000  NA 137 138  K 4.1 4.5  CL 108 110*  CO2 17 16  BUN 32* 40*  CREATININE 1.6* 1.4*  CALCIUM  8.5* 8.7   No results for input(s): AST, ALT, ALKPHOS, BILITOT, PROT, ALBUMIN in the last 8760 hours. Recent Labs    04/30/23 0000 05/03/23 0000  WBC 14.4 11.8  NEUTROABS 11,160.00 8,803.00  HGB 11.2* 11.4*  HCT 34* 35*  PLT 328 402*   Lab Results  Component Value Date   TSH 3.100 08/12/2019   Lab Results  Component Value Date   HGBA1C 7.4 06/12/2023   Lab Results  Component  Value Date   CHOL 113 06/12/2023   HDL 38 06/12/2023   LDLCALC 53 06/12/2023   TRIG 135 06/12/2023   CHOLHDL 4.4 12/19/2018    Significant Diagnostic Results in last 30 days:  US  RENAL Result Date: 03/31/2024 CLINICAL DATA:  hematuria EXAM: RENAL / URINARY TRACT ULTRASOUND COMPLETE COMPARISON:  06/12/2014 FINDINGS: Right Kidney: Renal measurements: 8.5 x 4.6 x 4.3 cm = volume: 86 mL.Normal  echogenicity. No mass. No hydronephrosis or nephrolithiasis. Left Kidney: Renal measurements: 9.6 x 4.9 x 3.9 cm = volume: 96 mL. Normal echogenicity. No mass. No hydronephrosis or nephrolithiasis. Bladder: Appears normal for degree of bladder distention. Other: None. IMPRESSION: No hydronephrosis or nephrolithiasis. Electronically Signed   By: Rogelia Myers M.D.   On: 03/31/2024 09:01    Assessment/Plan Vaginal candida ED eval 04/18/24 for urogenital/vaginal pain   Morphine/Oxycodone in ED was effective for pain control. Diagnosed candida infection.   Noted open area left side of the introitus, mild redness urogenital area, no vaginal discharge noted upon my examination today.    Chronic vaginal irritation, treated with Voriconazole , Trospium  by uroGYN, no change of nature of vaginal pain/irritation. Currently on  vaginal Boric acid suppository, Estrogen PV. Started Clobetasol topical in ED   Adding Nystatin  cream bid to urogenital reddened area for 10 days.   Overactive bladder no change of urinary symptoms, on Trospium .   CKD (chronic kidney disease) stage 3, GFR 30-59 ml/min (HCC)  Bun/creat 40/1.4 05/03/23  Dyslipidemia LDL 54 06/12/23, taking Pravastatin   Type 2 diabetes mellitus with hyperglycemia, without long-term current use of insulin (HCC) Hgb A1c 7.4 06/12/23, chronic insulin, on Januvia , Metformin   Major neurocognitive disorder (HCC)  SNF [facility] for care needs. Underwent Neurology eavl 2021, on Namenda.    Recurrent depression stable, on Sertraline   HTN (hypertension) Blood pressure is controlled, taking Lisinopril , Metoprolol   Peripheral polyneuropathy Chronic pain/neuropathy, taking Gabapentin .     Family/ staff Communication: plan of care reviewed with the patient and charge nurse.   Labs/tests ordered:  none

## 2024-04-18 NOTE — Assessment & Plan Note (Signed)
-  stable, on Sertraline

## 2024-04-18 NOTE — Assessment & Plan Note (Signed)
 no change of urinary symptoms, on Trospium .

## 2024-04-18 NOTE — Assessment & Plan Note (Signed)
 Hgb A1c 7.4 06/12/23, chronic insulin, on Januvia , Metformin 

## 2024-04-18 NOTE — Assessment & Plan Note (Signed)
 ED eval 04/18/24 for urogenital/vaginal pain   Morphine/Oxycodone in ED was effective for pain control. Diagnosed candida infection.   Noted open area left side of the introitus, mild redness urogenital area, no vaginal discharge noted upon my examination today.    Chronic vaginal irritation, treated with Voriconazole , Trospium  by uroGYN, no change of nature of vaginal pain/irritation. Currently on  vaginal Boric acid suppository, Estrogen PV. Started Clobetasol topical in ED   Adding Nystatin  cream bid to urogenital reddened area for 10 days.

## 2024-04-18 NOTE — Assessment & Plan Note (Signed)
 Bun/creat 40/1.4 05/03/23

## 2024-04-25 ENCOUNTER — Other Ambulatory Visit: Payer: Self-pay | Admitting: Adult Health

## 2024-04-25 DIAGNOSIS — R102 Pelvic and perineal pain unspecified side: Secondary | ICD-10-CM

## 2024-04-25 MED ORDER — TRAMADOL HCL 50 MG PO TABS: 50.0000 mg | ORAL_TABLET | Freq: Two times a day (BID) | ORAL | 0 refills | Status: DC | PRN

## 2024-04-28 ENCOUNTER — Other Ambulatory Visit: Payer: Self-pay

## 2024-04-28 ENCOUNTER — Encounter: Payer: Self-pay | Admitting: Internal Medicine

## 2024-04-28 ENCOUNTER — Telehealth: Payer: Self-pay

## 2024-04-28 ENCOUNTER — Ambulatory Visit: Admitting: Internal Medicine

## 2024-04-28 VITALS — BP 125/76 | HR 70 | Temp 97.9°F

## 2024-04-28 DIAGNOSIS — B3731 Acute candidiasis of vulva and vagina: Secondary | ICD-10-CM

## 2024-04-28 DIAGNOSIS — N940 Mittelschmerz: Secondary | ICD-10-CM | POA: Diagnosis not present

## 2024-04-28 MED ORDER — VALACYCLOVIR HCL 1 G PO TABS: 1000.0000 mg | ORAL_TABLET | Freq: Two times a day (BID) | ORAL | 0 refills | Status: AC

## 2024-04-28 NOTE — Telephone Encounter (Signed)
 I spoke to the nurse Tully with [facility] and gave orders for patient to have a vaginal swab performed to test for GC/chlamydia and trich per Dr. Dennise. I will also fax the order. I also faxed Rx for Valtrex for the patient.  Helga ONEIDA Ligas, CMA  Fax [phone]

## 2024-04-28 NOTE — Patient Instructions (Addendum)
 Complete nightly boric acid suppository x 21 days eot 05/06/24-> if no improvement in symptoms then flucytosine cream x 2-3 weeks nightly F/u with gyn F/U with ID PRN

## 2024-04-28 NOTE — Progress Notes (Addendum)
 Patient Active Problem List   Diagnosis Date Noted   CKD (chronic kidney disease) stage 3, GFR 30-59 ml/min (HCC) 04/18/2024   Major neurocognitive disorder (HCC) 04/18/2024   Recurrent depression 04/18/2024   Vaginal candida 03/07/2024   Overactive bladder 03/07/2024   Incontinence of feces 03/07/2024   Leukocytes in urine 03/07/2024   Vaginal atrophy 03/07/2024   Senile degeneration of brain 12/05/2022   Type 2 diabetes mellitus with hyperglycemia, without long-term current use of insulin (HCC) 12/05/2022   Dental abscess 09/25/2022   Urinary pain 07/05/2022   Dysuria 11/12/2021   Insomnia 12/23/2018   Urge incontinence 05/09/2017   Venous stasis 05/09/2017   Peripheral polyneuropathy 05/22/2016   Controlled diabetes mellitus with diabetic nephropathy, without long-term current use of insulin (HCC) 07/29/2015   IBS (irritable bowel syndrome) 12/01/2014   Vertical diplopia 11/17/2014   Non-melanoma skin cancer 05/13/2014   Gait disorder 05/26/2012   Seasonal allergies 09/12/2011   Anxiety 09/12/2011   HTN (hypertension) 09/12/2011   Breast cancer (HCC) 09/12/2011   GERD (gastroesophageal reflux disease) 09/12/2011   Dyslipidemia 09/12/2011   Osteopenia 09/12/2011   DJD (degenerative joint disease) 09/12/2011   Factor 5 Leiden mutation, heterozygous 09/12/2011   History of colonic polyps 09/12/2011    Patient's Medications  New Prescriptions   No medications on file  Previous Medications   ACETAMINOPHEN  (TYLENOL ) 500 MG TABLET    Take 1,000 mg by mouth every 12 (twelve) hours as needed.   ACETAMINOPHEN  (TYLENOL ) 500 MG TABLET    Take 1,000 mg by mouth every evening.   ALBUTEROL  (VENTOLIN  HFA) 108 (90 BASE) MCG/ACT INHALER    Inhale 2 puffs into the lungs every 6 (six) hours as needed for wheezing or shortness of breath.   ANTISEPTIC ORAL RINSE (BIOTENE) LIQD    30 mLs by Mouth Rinse route 2 (two) times daily. For dry mouth   ASPIRIN 81 MG CHEWABLE TABLET     Chew by mouth daily.   BORIC ACID 600 MG SUPP    Place 1 suppository vaginally once a week.   CALCIUM  CARBONATE (TUMS - DOSED IN MG ELEMENTAL CALCIUM ) 500 MG CHEWABLE TABLET    Chew 1 tablet by mouth every 8 (eight) hours as needed for indigestion or heartburn (1-2 tablets).   CLOBETASOL OINTMENT (TEMOVATE) 0.05 %    Apply 1 Application topically daily. Apply a pea-sized amount to the affected area once daily   CONTINUOUS GLUCOSE SENSOR (DEXCOM G7 SENSOR) MISC    by Does not apply route. 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 Document site where new sensor is placed.   ESTRADIOL  (IMVEXXY  MAINTENANCE PACK) 4 MCG INST    Place 1 suppository vaginally 2 (two) times a week.   FLUTICASONE  (FLONASE ) 50 MCG/ACT NASAL SPRAY    Place 1 spray into both nostrils as needed for allergies or rhinitis.   GABAPENTIN  (NEURONTIN ) 100 MG CAPSULE    Take 200 mg by mouth at bedtime.   HYDROCORTISONE  (ANUSOL -HC) 2.5 % RECTAL CREAM    Place 1 Application rectally 2 (two) times daily.   HYDROCORTISONE  (ANUSOL -HC) 25 MG SUPPOSITORY    Place 25 mg rectally as needed for hemorrhoids or anal itching.   INSULIN ASPART (NOVOLOG) 100 UNIT/ML INJECTION    Inject into the skin 2 (two) times daily. 8 units  if CBG more then 200 12 units if CBG more then 300   INSULIN GLARGINE (LANTUS) 100 UNIT/ML INJECTION    Inject 42 Units into the skin every morning.   LIDOCAINE  (XYLOCAINE ) 2 % JELLY    Apply 1 Application topically 2 (two) times daily as needed.   LISINOPRIL  (ZESTRIL ) 2.5 MG TABLET    Take 2.5 mg by mouth in the morning.   LOPERAMIDE (IMODIUM A-D) 2 MG TABLET    Take 2 mg by mouth every 12 (twelve) hours as needed for diarrhea or loose stools.   LORATADINE (CLARITIN) 10 MG TABLET    Take 10 mg by mouth every morning.   MELATONIN 5 MG CAPS    Take 7.5 mg  by mouth daily. As needed for insomnia   MEMANTINE (NAMENDA) 5 MG TABLET    Take 5 mg by mouth at bedtime.   METFORMIN  (GLUCOPHAGE ) 500 MG TABLET    Take 250 mg by mouth every evening.   METFORMIN  (GLUCOPHAGE ) 500 MG TABLET    Take 500 mg by mouth every morning.   METOPROLOL  SUCCINATE (TOPROL -XL) 100 MG 24 HR TABLET    Take 100 mg by mouth 2 (two) times daily. Take with or immediately following a meal.   ONDANSETRON  (ZOFRAN ) 4 MG TABLET    Take 1 tablet (4 mg total) by mouth every 8 (eight) hours as needed for nausea or vomiting.   POLYETHYLENE GLYCOL (MIRALAX / GLYCOLAX) 17 G PACKET    Take 17 g by mouth as needed. Give 1 scoop by mouth every 24 hours as needed for constipation   PRAVASTATIN  (PRAVACHOL ) 20 MG TABLET    Take 20 mg by mouth every evening.   PSYLLIUM (METAMUCIL) 58.6 % PACKET    Take 1 packet by mouth daily.   SENNA-DOCUSATE (SENOKOT-S) 8.6-50 MG TABLET    Take 1.5 tablets by mouth daily. For constipation   SERTRALINE  (ZOLOFT ) 25 MG TABLET    Take 75 mg by mouth daily.   SITAGLIPTIN  (JANUVIA ) 50 MG TABLET    Take 1 tablet (50 mg total) by mouth daily.   TRAMADOL (ULTRAM) 50 MG TABLET    Take 1 tablet (50 mg total) by mouth 2 (two) times daily as needed for up to 7 days.   TROSPIUM  CHLORIDE 60 MG CP24    Take 1 capsule (60 mg total) by mouth daily.   VIBEGRON 75 MG TABS    Take 1 tablet (75 mg total) by mouth daily.   WHITE PETROLATUM (VASELINE) OINT    Apply 1 Application topically daily. Apply daily to affected vulvar area as an emollient   ZINC OXIDE 20 % OINTMENT    Apply 1 application  topically as needed (To buttocks after every incontinent epsiode and as needed for redness).  Modified Medications   No medications on file  Discontinued Medications   No medications on file    Subjective: 83 YO female with PMHX as below presents for management of candida vaginitis -Ob/gyn and urology sees patient for candida vaginitis.  -Pt states she has. Pt famly states UTI and yeast  infection for 2-3 year.  -Huband  presents with  pt . Notes ain is in her vagina. Not burning. States inflamed sore present.  -pt on boric acid nightly x 21 days.    Review of Systems: Review of Systems  All other systems reviewed and are negative.   Past Medical History:  Diagnosis Date   Allergy    Fleeta Smock; allergy shots weekly.  Anxiety    Arthritis    DDD lumbar spine.     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    Social History   Tobacco Use   Smoking status: Never   Smokeless tobacco: Never  Vaping Use   Vaping status: Never Used  Substance Use Topics   Alcohol use: No    Alcohol/week: 0.0 standard drinks of alcohol    Comment: rare   Drug use: No    Family History  Problem Relation Age of Onset   Heart disease Mother        AMI as cause of death age 39.   Heart disease Father        AMI as cause of death.   Heart disease Brother     Allergies  Allergen Reactions   Fruit & Vegetable Daily [Nutritional Supplements] Diarrhea   Mixed Ragweed    Onion Other (See Comments)    indigestion   Tree Extract Swelling   Sulfa Drugs Cross Reactors Rash    All over the body    Health Maintenance  Topic Date Due   Diabetic kidney evaluation - Urine ACR  05/17/2019   HEMOGLOBIN A1C  12/11/2023   Influenza Vaccine  02/08/2024   COVID-19 Vaccine (9 - 2025-26 season) 03/10/2024   OPHTHALMOLOGY EXAM  04/15/2024   Diabetic kidney evaluation - eGFR measurement  05/02/2024   FOOT EXAM  05/13/2024   Medicare Annual Wellness (AWV)  02/28/2025   DTaP/Tdap/Td (3 - Td or Tdap) 07/06/2032   Pneumococcal Vaccine: 50+ Years  Completed   DEXA SCAN  Completed   Zoster Vaccines- Shingrix  Completed    Meningococcal B Vaccine  Aged Out    Objective:  There were no vitals filed for this visit. There is no height or weight on file to calculate BMI.  Physical Exam Constitutional:      Appearance: Normal appearance.  HENT:     Head: Normocephalic and atraumatic.     Right Ear: Tympanic membrane normal.     Left Ear: Tympanic membrane normal.     Nose: Nose normal.     Mouth/Throat:     Mouth: Mucous membranes are moist.  Eyes:     Extraocular Movements: Extraocular movements intact.     Conjunctiva/sclera: Conjunctivae normal.     Pupils: Pupils are equal, round, and reactive to light.  Cardiovascular:     Rate and Rhythm: Normal rate and regular rhythm.     Heart sounds: No murmur heard.    No friction rub. No gallop.  Pulmonary:     Effort: Pulmonary effort is normal.     Breath sounds: Normal breath sounds.  Abdominal:     General: Abdomen is flat.     Palpations: Abdomen is soft.  Skin:    General: Skin is warm and dry.  Neurological:     General: No focal deficit present.     Mental Status: She is alert and oriented to person, place, and time.  Psychiatric:        Mood and Affect: Mood normal.    Physical Exam   Lab Results Lab Results  Component Value Date   WBC 11.8 05/03/2023  HGB 11.4 (A) 05/03/2023   HCT 35 (A) 05/03/2023   MCV 84 09/09/2019   PLT 402 (A) 05/03/2023    Lab Results  Component Value Date   CREATININE 1.4 (A) 05/03/2023   BUN 40 (A) 05/03/2023   NA 138 05/03/2023   K 4.5 05/03/2023   CL 110 (A) 05/03/2023   CO2 16 05/03/2023    Lab Results  Component Value Date   ALT 12 06/12/2022   AST 14 06/12/2022   ALKPHOS 122 06/12/2022   BILITOT 0.3 08/12/2019    Lab Results  Component Value Date   CHOL 113 06/12/2023   HDL 38 06/12/2023   LDLCALC 53 06/12/2023   TRIG 135 06/12/2023   CHOLHDL 4.4 12/19/2018   Lab Results  Component Value Date   LABRPR Non Reactive 08/12/2019   No results found for: HIV1RNAQUANT,  HIV1RNAVL, CD4TABS   Problem List Items Addressed This Visit   None  Results   Assessment/Plan #Vaginal pain #Hx of candida glabrata vaginitis -Pt states vaginal area has been sore for months'  repeated vaginal swab with OB+ candida glabrata since 09/18/23 -8/29 seen by gen rx weekly boric acid+ vori x 7 days Patient has completed boric acid earlier in the year April/May and then Diflucan  and voriconazole  x 2 weeks followed by boric acid substance 10/7 x 3 weeks EOT 10/28.  Continues to have pain. Repeated  -Urine cytology not able to be done today today( pt will need to be catheter) per huband.  Can be doe at urology office or Gyn. -She is unable to standup, as such could not do a vaginal exam, do not have hoyer lift.  - Communicated with Gyn Dr. Marilynne: in regards to patient.  Reviewed imaging and chart on 10/3.  After reviewing imaging we will do Valtrex x 10 days for possible HSV.  I think could be HSV given pain and lesions.  Recommend follow-up Gyn, they had noted that there would obtain biopsy if her symptoms continue as well.  Okay to retest at the end of treatment if symptoms continue.  If able to do swab of lesion with HSV that would be helpful is establishing diagnosis.  Patient states she was too painful for obtaining urine or exam today.  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 -Complete nightly boric acid suppository x 21 days eot 05/06/24-> if no improvement in symptoms then flucytosine cream x 2-3 weeks nightly -F/U with ID PRN   Loney Stank, MD [facility] 04/28/2024, 2:03 PM   I have personally spent 80 minutes involved in face-to-face and non-face-to-face activities for this patient on the day of the visit. Professional time spent includes the following activities: Preparing to see the patient (review of tests), Obtaining and/or reviewing separately obtained history (admission/discharge  record), Performing a medically appropriate examination and/or evaluation , Ordering medications/tests/procedures, referring and communicating with other health care professionals, Documenting clinical information in the EMR, Independently interpreting results (not separately reported), Communicating results to the patient/family/caregiver, Counseling and educating the patient/family/caregiver and Care coordination (not separately reported).

## 2024-04-29 ENCOUNTER — Non-Acute Institutional Stay (SKILLED_NURSING_FACILITY): Payer: Self-pay | Admitting: Adult Health

## 2024-04-29 DIAGNOSIS — Z794 Long term (current) use of insulin: Secondary | ICD-10-CM

## 2024-04-29 DIAGNOSIS — N3281 Overactive bladder: Secondary | ICD-10-CM

## 2024-04-29 DIAGNOSIS — E1140 Type 2 diabetes mellitus with diabetic neuropathy, unspecified: Secondary | ICD-10-CM | POA: Diagnosis not present

## 2024-04-29 DIAGNOSIS — I10 Essential (primary) hypertension: Secondary | ICD-10-CM | POA: Diagnosis not present

## 2024-04-29 DIAGNOSIS — F339 Major depressive disorder, recurrent, unspecified: Secondary | ICD-10-CM

## 2024-04-29 DIAGNOSIS — F03A0 Unspecified dementia, mild, without behavioral disturbance, psychotic disturbance, mood disturbance, and anxiety: Secondary | ICD-10-CM

## 2024-04-30 ENCOUNTER — Encounter: Payer: Self-pay | Admitting: Adult Health

## 2024-04-30 NOTE — Progress Notes (Signed)
 Location:  [facility] Room Number: N26-A Place of Service:  SNF (31) Provider:  Medina-Vargas, Breunna Nordmann, DNP, FNP-BC  Patient Care Team: Charlanne Fredia CROME, MD as PCP - General (Internal Medicine) Octavia Charleston, MD (Ophthalmology) Gail Favorite, MD as Consulting Physician (General Surgery) Trixie File, MD as Consulting Physician (Internal Medicine)  Extended Emergency Contact Information Primary Emergency Contact: [name],[name] Address: [address]          [place] [address] [place] Home Phone: [phone] Mobile Phone: [phone] Relation: Spouse  Code Status:  DNR  Goals of care: Advanced Directive information    04/30/2024   11:34 AM  Advanced Directives  Does Patient Have a Medical Advance Directive? Yes  Type of Advance Directive Living will;Out of facility DNR (pink MOST or yellow form)  Does patient want to make changes to medical advance directive? No - Patient declined  Pre-existing out of facility DNR order (yellow form or pink MOST form) Yellow form placed in chart (order not valid for inpatient use);Pink MOST form placed in chart (order not valid for inpatient use)     Chief Complaint  Patient presents with   Medical Management of Chronic Issues    Routine Visit, needs to discuss hemoglobin A1C, eye exam, diabetic kidney evaluation eGFR/Urine ACR    HPI:  Pt is a 83 y.o. female seen today for medical management of chronic diseases. She is a resident of [id number].  Primary hypertension  -  BP 136/80, takes lisinopril  and metoprolol  succinate  Type 2 diabetes mellitus with diabetic neuropathy, with long-term current use of insulin (HCC) -  A1C 9, 02/14/2024, takes Lantus, NovoLog sliding scale, metformin  and Januvia   Overactive bladder -   takes Gemtesa and trospium    Major depression, recurrent, chronic -   takes sertraline   Mild dementia without behavioral disturbance, psychotic disturbance,  mood disturbance, or anxiety, unspecified dementia type (HCC) -  BIMS score 15, takes Namenda   Past Medical History:  Diagnosis Date   Allergy    Fleeta Smock; allergy shots weekly.     Anxiety    Arthritis    DDD lumbar spine.     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   Past Surgical History:  Procedure Laterality Date   BREAST LUMPECTOMY  1996   Left breast cancer   BREAST SURGERY N/A    [employer]rees[employer] 12/12/2019   EYE SURGERY N/A    [employer]ia 12/12/2019   MASTECTOMY  2002   Bilateral for breast cancer   VAGINAL HYSTERECTOMY  1985   DUB; uterine fibroids; ovaries intact.    Allergies  Allergen Reactions   Fruit & Vegetable Daily [Nutritional Supplements] Diarrhea   Mixed Ragweed    Onion Other (See Comments)    indigestion   Tree Extract Swelling   Sulfa Drugs Cross Reactors Rash    All over the body    Outpatient Encounter Medications as of 04/29/2024  Medication Sig   acetaminophen  (TYLENOL ) 500 MG tablet Take 1,000 mg by mouth every 12 (twelve) hours as needed.   acetaminophen  (TYLENOL ) 500 MG tablet Take 1,000 mg by mouth every evening.   albuterol  (VENTOLIN  HFA)  108 (90 Base) MCG/ACT inhaler Inhale 2 puffs into the lungs every 6 (six) hours as needed for wheezing or shortness of breath.   antiseptic oral rinse (BIOTENE) LIQD 30 mLs by Mouth Rinse route 2 (two) times daily. For dry mouth   aspirin 81 MG chewable tablet Chew by mouth daily.   Boric Acid 600 MG SUPP Place 1 suppository vaginally once a week.   calcium  carbonate (TUMS - DOSED IN MG ELEMENTAL CALCIUM ) 500 MG chewable tablet Chew 1 tablet by mouth every 8 (eight) hours as needed for indigestion or heartburn (1-2 tablets).   clobetasol ointment  (TEMOVATE) 0.05 % Apply 1 Application topically daily. Apply a pea-sized amount to the affected area once daily   Continuous Glucose Sensor (DEXCOM G7 SENSOR) MISC by Does not apply route. 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 Document site where new sensor is placed.   Estradiol  (IMVEXXY  MAINTENANCE PACK) 4 MCG INST Place 1 suppository vaginally 2 (two) times a week.   fluticasone  (FLONASE ) 50 MCG/ACT nasal spray Place 1 spray into both nostrils as needed for allergies or rhinitis.   gabapentin  (NEURONTIN ) 100 MG capsule Take 200 mg by mouth at bedtime.   hydrocortisone  (ANUSOL -HC) 2.5 % rectal cream Place 1 Application rectally 2 (two) times daily.   hydrocortisone  (ANUSOL -HC) 25 MG suppository Place 25 mg rectally as needed for hemorrhoids or anal itching.   insulin aspart (NOVOLOG) 100 UNIT/ML injection Inject into the skin 2 (two) times daily. 8 units if CBG more then 200 12 units if CBG more then 300   insulin glargine (LANTUS) 100 UNIT/ML injection Inject 42 Units into the skin every morning.   lidocaine  (XYLOCAINE ) 2 % jelly Apply 1 Application topically 2 (two) times daily as needed.   lisinopril  (ZESTRIL ) 2.5 MG tablet Take 5 mg by mouth in the morning.   loperamide (IMODIUM A-D) 2 MG tablet Take 2 mg by mouth every 12 (twelve) hours as needed for diarrhea or loose stools.   loratadine (CLARITIN) 10 MG tablet Take 10 mg by mouth every morning.   Melatonin 5 MG CAPS Take 7.5 mg by mouth daily. As needed for insomnia   memantine (NAMENDA) 5 MG tablet Take 5 mg by mouth at bedtime.   metFORMIN  (GLUCOPHAGE ) 500 MG tablet Take 250 mg by mouth every evening.   metFORMIN  (GLUCOPHAGE ) 500 MG tablet Take 500 mg by mouth every morning.   metoprolol  succinate (TOPROL -XL) 100 MG 24 hr tablet Take 100 mg by mouth 2 (two)  times daily. Take with or immediately following a meal.   ondansetron  (ZOFRAN ) 4 MG tablet Take 1 tablet (4 mg total) by mouth every 8 (eight) hours as needed for nausea or vomiting.   polyethylene glycol (MIRALAX / GLYCOLAX) 17 g packet Take 17 g by mouth as needed. Give 1 scoop by mouth every 24 hours as needed for constipation   pravastatin  (PRAVACHOL ) 20 MG tablet Take 20 mg by mouth every evening.   psyllium (METAMUCIL) 58.6 % packet Take 1 packet by mouth daily.   senna-docusate (SENOKOT-S) 8.6-50 MG tablet Take 1.5 tablets by mouth daily. For constipation   sertraline  (ZOLOFT ) 25 MG tablet Take 75 mg by mouth daily.   sitaGLIPtin  (JANUVIA ) 50 MG tablet Take 1 tablet (50 mg total) by mouth daily.   traMADol (ULTRAM) 50 MG tablet Take 1 tablet (50 mg  total) by mouth 2 (two) times daily as needed for up to 7 days.   Trospium  Chloride 60 MG CP24 Take 1 capsule (60 mg total) by mouth daily.   valACYclovir (VALTREX) 1000 MG tablet Take 1 tablet (1,000 mg total) by mouth 2 (two) times daily for 10 days.   Vibegron 75 MG TABS Take 1 tablet (75 mg total) by mouth daily.   white petrolatum (VASELINE) OINT Apply 1 Application topically daily. Apply daily to affected vulvar area as an emollient   zinc oxide 20 % ointment Apply 1 application  topically as needed (To buttocks after every incontinent epsiode and as needed for redness).   No facility-administered encounter medications on file as of 04/29/2024.    Review of Systems  Constitutional:  Negative for appetite change, chills, fatigue and fever.  HENT:  Negative for congestion, hearing loss, rhinorrhea and sore throat.   Eyes: Negative.   Respiratory:  Negative for cough, shortness of breath and wheezing.   Cardiovascular:  Negative for chest pain, palpitations and leg swelling.  Gastrointestinal:  Negative for abdominal pain, constipation, diarrhea, nausea and vomiting.  Musculoskeletal:  Negative for arthralgias, back pain and myalgias.   Skin:  Negative for color change, rash and wound.  Neurological:  Negative for dizziness, weakness and headaches.  Psychiatric/Behavioral:  Negative for behavioral problems. The patient is not nervous/anxious.      Immunization History  Administered Date(s) Administered   Fluad Quad(high Dose 65+) 05/03/2022, 04/15/2024   INFLUENZA, HIGH DOSE SEASONAL PF 04/09/2014, 03/22/2015, 03/24/2016, 03/29/2018, 04/16/2020, 05/09/2023   Influenza Split 03/19/2012, 03/19/2013   Influenza,inj,Quad PF,6+ Mos 04/09/2014, 03/22/2015, 03/24/2016   Influenza-Unspecified 03/19/2012, 03/19/2013, 04/09/2017, 03/24/2018, 04/16/2020   Moderna Covid-19 Vaccine Bivalent Booster 68yrs & up 05/16/2023   Moderna SARS-COV2 Booster Vaccination 05/16/2022   Moderna Sars-Covid-2 Vaccination 05/19/2021   PFIZER(Purple Top)SARS-COV-2 Vaccination 08/17/2019, 09/10/2019, 04/06/2020, 11/19/2020   Pfizer Covid-19 Vaccine Bivalent Booster 59yrs & up 11/18/2020, 12/21/2021   Pneumococcal Conjugate-13 11/05/2014   Pneumococcal Polysaccharide-23 07/10/2004, 12/12/2010, 05/02/2016   Pneumococcal-Unspecified 07/10/2004, 12/12/2010, 05/02/2016   Respiratory Syncytial Virus Vaccine,Recomb Aduvanted(Arexvy) 06/24/2022   Td 07/10/2009   Tdap 07/06/2022   Zoster Recombinant(Shingrix) 04/09/2017, 07/19/2017   Zoster, Live 07/10/2006   Pertinent  Health Maintenance Due  Topic Date Due   HEMOGLOBIN A1C  12/11/2023   OPHTHALMOLOGY EXAM  04/15/2024   FOOT EXAM  05/13/2024   Influenza Vaccine  Completed   DEXA SCAN  Completed      04/18/2023    2:05 PM 05/14/2023   12:34 PM 07/19/2023   10:42 AM 02/29/2024   12:20 PM 04/28/2024    2:01 PM  Fall Risk  Falls in the past year? 0 0 0 0   Was there an injury with Fall? 0 0 0 0   Fall Risk Category Calculator 0 0 0 0   Patient at Risk for Falls Due to History of fall(s);Impaired balance/gait;Impaired mobility History of fall(s);Impaired balance/gait;Impaired mobility History of  fall(s);Impaired balance/gait;Impaired mobility History of fall(s);Impaired mobility Impaired balance/gait;Impaired mobility  Fall risk Follow up  Falls evaluation completed;Education provided;Falls prevention discussed Falls evaluation completed;Education provided;Falls prevention discussed Falls evaluation completed;Education provided Falls evaluation completed     Vitals:   04/30/24 1136  BP: 136/80  Pulse: 66  Resp: 16  Temp: (!) 97 F (36.1 C)  SpO2: 96%  Weight: 175 lb 9.6 oz (79.7 kg)  Height: 5' 4 (1.626 m)   Body mass index is 30.14 kg/m.  Physical Exam Constitutional:  General: She is not in acute distress.    Appearance: She is obese.  HENT:     Head: Normocephalic and atraumatic.     Nose: Nose normal.     Mouth/Throat:     Mouth: Mucous membranes are moist.  Eyes:     Conjunctiva/sclera: Conjunctivae normal.  Cardiovascular:     Rate and Rhythm: Normal rate and regular rhythm.  Pulmonary:     Effort: Pulmonary effort is normal.     Breath sounds: Normal breath sounds.  Abdominal:     General: Bowel sounds are normal.     Palpations: Abdomen is soft.  Musculoskeletal:        General: Normal range of motion.     Cervical back: Normal range of motion.  Skin:    General: Skin is warm and dry.  Neurological:     Mental Status: She is alert.  Psychiatric:        Mood and Affect: Mood normal.        Behavior: Behavior normal.      Labs reviewed: Recent Labs    05/03/23 0000  NA 138  K 4.5  CL 110*  CO2 16  BUN 40*  CREATININE 1.4*  CALCIUM  8.7   No results for input(s): AST, ALT, ALKPHOS, BILITOT, PROT, ALBUMIN in the last 8760 hours. Recent Labs    05/03/23 0000  WBC 11.8  NEUTROABS 8,803.00  HGB 11.4*  HCT 35*  PLT 402*   Lab Results  Component Value Date   TSH 3.100 08/12/2019   Lab Results  Component Value Date   HGBA1C 7.4 06/12/2023   Lab Results  Component Value Date   CHOL 113 06/12/2023   HDL 38  06/12/2023   LDLCALC 53 06/12/2023   TRIG 135 06/12/2023   CHOLHDL 4.4 12/19/2018    Significant Diagnostic Results in last 30 days:  No results found.  Assessment/Plan  1. Primary hypertension (Primary) -   BP stable -    Continue lisinopril  5 mg daily -   Continue metoprolol  succinate 100 mg   daily  2. Type 2 diabetes mellitus with diabetic neuropathy, with long-term current use of insulin (HCC) -  A1c 9.0, 02/14/2024   -   Continue Lantus 42 units daily - Continue NovoLog sliding scale with meals -   Continue Januvia  50 mg daily - Continue metformin  500 mg in the morning and 250 mg in the evening  3. Overactive bladder -Continue Gemtesa 75 mg at bedtime -    Continue trospium  ER 60 mg daily  4. Major depression, recurrent, chronic -  Mood is stable   -   Continue sertraline  75 mg daily  5. Mild dementia without behavioral disturbance, psychotic disturbance, mood disturbance, or anxiety, unspecified dementia type (HCC) -  BIMS score 15 /15, no cognitive impairment -   Continue Namenda 5 mg at bedtime    Family/ staff Communication: Discussed plan of care with resident and charge nurse.  Labs/tests ordered: None    Rogene Meth Medina-Vargas, DNP, MSN, FNP-BC [facility] [phone] (Monday-Friday 8:00 a.m. - 5:00 p.m.) [phone] (after hours)

## 2024-05-01 ENCOUNTER — Telehealth: Payer: Self-pay | Admitting: Internal Medicine

## 2024-05-01 MED ORDER — OXYCODONE HCL 5 MG PO TABS: 5.0000 mg | ORAL_TABLET | Freq: Two times a day (BID) | ORAL | 0 refills | Status: DC | PRN

## 2024-05-01 NOTE — Telephone Encounter (Signed)
 Has severe Pain inher Perineal area Getting treatment for Herpes by ID Will discontinue tramadol and do Oxycodone 5 mg BID prn for 14 days

## 2024-05-02 LAB — SURESWAB® ADVANCED VAGINITIS PLUS,TMA: Chlamydia, Swab/Urine, PCR: NEGATIVE

## 2024-05-09 ENCOUNTER — Ambulatory Visit: Admitting: Obstetrics and Gynecology

## 2024-05-09 ENCOUNTER — Other Ambulatory Visit (HOSPITAL_COMMUNITY)
Admission: RE | Admit: 2024-05-09 | Discharge: 2024-05-09 | Disposition: A | Source: Ambulatory Visit | Attending: Obstetrics and Gynecology | Admitting: Obstetrics and Gynecology

## 2024-05-09 ENCOUNTER — Encounter: Payer: Self-pay | Admitting: Obstetrics and Gynecology

## 2024-05-09 VITALS — BP 121/71 | HR 70 | Ht 64.0 in

## 2024-05-09 DIAGNOSIS — B379 Candidiasis, unspecified: Secondary | ICD-10-CM | POA: Diagnosis not present

## 2024-05-09 DIAGNOSIS — N898 Other specified noninflammatory disorders of vagina: Secondary | ICD-10-CM | POA: Diagnosis present

## 2024-05-09 MED ORDER — LIDOCAINE 5 % EX OINT: 1.0000 | TOPICAL_OINTMENT | CUTANEOUS | 0 refills | Status: DC | PRN

## 2024-05-09 MED ORDER — NONFORMULARY OR COMPOUNDED ITEM: 0 refills | Status: DC

## 2024-05-09 MED ORDER — LIDOCAINE 5 % EX OINT: 1.0000 | TOPICAL_OINTMENT | CUTANEOUS | 0 refills | Status: AC | PRN

## 2024-05-09 MED ORDER — NONFORMULARY OR COMPOUNDED ITEM
0 refills | Status: AC
Start: 2024-05-09 — End: ?

## 2024-05-09 NOTE — Progress Notes (Signed)
 [name] [name]  SUBJECTIVE  History of Present Illness:  Patient presents with her husband   [name] [name] is a 83 y.o. female seen in follow-up for vaginal pain. Plan at last [name] was do boric acid suppositories and see ID. 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 Patient also went to the ER and was seen and evaluated with a GYN consult with concern for possible Lichen Planus and treated with steroid cream. ID suggestions were for repeat vaginal swab, skin biopsy, and HSV swab if lesion present.  Patinet's current vaginal regimen per Will at [facility] is that she is on vaginal estrogen x2 weekly, no boric acid, and has been using the steroid cream.   Due to history of dementia, patients husband gave permission for exam and vaginal biopsy.   Past Medical History: Patient  has a past medical history of Allergy, Anxiety, Arthritis, Cancer (HCC), Clotting disorder, Diabetes mellitus, Diabetes mellitus without complication (HCC), Hypertension, and IBS (irritable bowel syndrome).   Past Surgical History: She  has a past surgical history that includes Mastectomy (2002); Breast lumpectomy (1996); Vaginal hysterectomy (1985); Breast surgery (N/A); and Eye surgery (N/A).   Medications: She has a current medication list which includes the following prescription(s): acetaminophen , albuterol , antiseptic oral rinse, aspirin, boric acid, calcium  carbonate, clobetasol ointment, dexcom g7 sensor, imvexxy  maintenance pack, fluticasone , gabapentin , hydrocortisone , hydrocortisone , insulin aspart, insulin glargine, lisinopril , loperamide, loratadine, melatonin, memantine, metformin , metoprolol  succinate, ondansetron , oxycodone, polyethylene glycol, pravastatin , psyllium, senna-docusate, sertraline , sitagliptin , trospium  chloride, vibegron, white petrolatum, zinc oxide,  lidocaine , and NONFORMULARY OR COMPOUNDED ITEM.   Allergies: Patient is allergic to fruit & vegetable daily [nutritional supplements], mixed ragweed, onion, tree extract, and sulfa drugs cross reactors.   Social History: Patient  reports that she has never smoked. She has never used smokeless tobacco. She reports that she does not drink alcohol and does not use drugs.     OBJECTIVE     Physical Exam: Vitals:   05/09/24 1142  BP: 121/71  Pulse: 70  Height: 5' 4 (1.626 m)   Gen: No apparent distress, A&O x 3.  Detailed Urogynecologic Evaluation:  External vaginal region is pale and dusky. When swab was inserted a large gush of green fluid was expelled from the vagina. Patient reported discomfort with any vaginal pressure.  Speculum exam showed small blood clots inside the vagina and a green vaginal discharge.  Skin was prepped with betadine and 3ml of 1% lidocaine  with epi was injected subdermally. Patient tolerated this well.  A punch biopsy was performed to the right lower vulva lip without difficulty. Dr. Marilynne placed x2 stitches to close the biopsy site due to bleeding.  Aptima swab repeated.  No obvious lesion for HSV present to culture.    ASSESSMENT AND PLAN    Ms. Henegar is a 83 y.o. 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 Vaginal irritation   3. Vaginal discharge    Patient to re-start use of vaginal boric acid suppositories as her husband reports they were helping. The overall tissue quality looked better than previous examination. Vaginal biopsy done to rule out lichen planus or vaginal cancer.  Will rule out continued glabrata infection, BV, or other yeast. Patient will be using boric acid nightly or as needed as she reports it provides relief of vaginal irritation symptoms.  We also ordered lidocaine  topical solution that can be used for pain.  We discussed the blood noted in the vagina and her husband did not want to pursue imaging a this time. He  stated that with her current functional level, if she were to have cancer it would not change the plan of care. He would not want to do any type of surgery, chemo, or radiation. We will focus on comfort and support at this time based on his goals for her care. He agreed with this plan of care.  Will plan to call with results of biopsy and results of swab and plan care from there. Will seed ID support if needed based on swab and biopsy.   Tyiesha Brackney G Iwao Shamblin, NP

## 2024-05-12 ENCOUNTER — Ambulatory Visit: Payer: Self-pay

## 2024-05-12 ENCOUNTER — Encounter: Payer: Self-pay | Admitting: Obstetrics and Gynecology

## 2024-05-12 LAB — CERVICOVAGINAL ANCILLARY ONLY
Bacterial Vaginitis (gardnerella): NEGATIVE
Candida Glabrata: POSITIVE — AB
Candida Vaginitis: NEGATIVE
Comment: NEGATIVE
Comment: NEGATIVE
Comment: NEGATIVE

## 2024-05-12 NOTE — Telephone Encounter (Signed)
 FYI Only or Action Required?: FYI only for provider: appointment scheduled on 05/14/24.  Patient was last seen in primary care on 04/29/24 .  Called Nurse Triage reporting Vaginal Pain.  Symptoms began several months ago.  Interventions attempted: Prescription medications: boric acid suppositories, oxycodone, lidocaine  gel, steroid cream, vaginal estrogen.  Symptoms are: severe vaginal painunchanged.  Triage Disposition: See Physician Within 24 Hours  Patient/caregiver understands and will follow disposition?: Yes               Copied from CRM #[id number]. Topic: Clinical - Red Word Triage >> May 12, 2024  3:38 PM Graeme ORN wrote: Red Word that prompted transfer to Nurse Triage: Incredible amount of vaginal pain calls him screaming- been to hospital not improved. Reason for Disposition  [1] MILD to MODERATE vaginal pain AND [2] present > 24 hours  (Exception: Chronic pain.)  Answer Assessment - Initial Assessment Questions 1. SYMPTOM: What's the main symptom you're concerned about? (e.g., pain, itching, dryness)     Vaginal pain.  2. LOCATION: Where is the  pain located? (e.g., inside/outside, left/right)     Vaginal  3. ONSET: When did the  pain  start?     3-4 months.  4. PAIN: Is there any pain? If Yes, ask: How bad is it? (Scale: 1-10; mild, moderate, severe)     Yes. Severe, she will call her son screaming in pain. He states it varies in severity. Son states that the pain improves somewhat after oxycodone and lidocaine  gel.  5. ITCHING: Is there any itching? If Yes, ask: How bad is it? (Scale: 1-10; mild, moderate, severe)     No.  6. CAUSE: What do you think is causing the discharge? Have you had the same problem before? What happened then?     Unsure, had candida infection that was treated. Awaiting swab and biopsy results from Urogyn.  7. OTHER SYMPTOMS: Do you have any other symptoms? (e.g., fever, itching, vaginal bleeding, pain with  urination, injury to genital area, vaginal foreign body)     Vaginal bleeding noted in Urogyn note. Son is not aware of any additional symptoms.  Patient has been seen in ED multiple times for complaint and is being followed by ID and Urogyn. Last Urogyn visit on 05/09/24, swabs and biopsies performed, awaiting results from provider.  Protocols used: Vaginal Symptoms-A-AH

## 2024-05-12 NOTE — Telephone Encounter (Signed)
 Please Cancel her Appointment. Patient is in Skilled care. This is her Chronic Complain and she is seen by many specialist. I would not be able to see her in the clinic. I will go to her room and try to see her after my clinic on Wed or will see her on Thurs

## 2024-05-13 NOTE — Telephone Encounter (Signed)
 I left a detailed voicemail for resident with Dr.Gupta's response and highlighted that we canceled appointment (mentioned more than once). I also advise that any future concerns should be directed towards the facility nurses and the office number if for independent living residents.

## 2024-05-13 NOTE — Telephone Encounter (Signed)
 Appointment canceled

## 2024-05-14 ENCOUNTER — Encounter: Admitting: Internal Medicine

## 2024-05-14 ENCOUNTER — Ambulatory Visit: Payer: Self-pay

## 2024-05-14 NOTE — Telephone Encounter (Signed)
 FYI Only or Action Required?: FYI only for provider: Nurse who called transferred to on call provider .  Called Nurse Triage reporting Vaginal Pain.  Symptoms began ongoing problem.  Interventions attempted: Prescription medications: Oxycodone.  Symptoms are: worse today.  Triage Disposition: See HCP Within 4 Hours (Or PCP Triage)  Patient/caregiver understands and will follow disposition?: No, wishes to speak with PCP     Copied from CRM #[id number]. Topic: Clinical - Red Word Triage >> May 14, 2024  5:51 PM Chiquita SQUIBB wrote: Red Word that prompted transfer to Nurse Triage: [facility] that the patient is having severe vagina pain, they have given her pain medication but it has not helped.     Reason for Disposition  [1] SEVERE vaginal pain AND [2] not improved 2 hours after pain medicine  Answer Assessment - Initial Assessment Questions Nurse from [facility] transferred to the on call provider.     1. SYMPTOM: What's the main symptom you're concerned about? (e.g., pain, itching, dryness)     Vaginal pain 2. LOCATION: Where is the pain located? (e.g., inside/outside, left/right)     Inside  3. ONSET: When did the pain start?     Ongoing problem  4. PAIN: Is there any pain? If Yes, ask: How bad is it? (Scale: 1-10; mild, moderate, severe)     Severe 5. ITCHING: Is there any itching? If Yes, ask: How bad is it? (Scale: 1-10; mild, moderate, severe)     Yes 6. CAUSE: What do you think is causing the discharge? Have you had the same problem before? What happened then?     Being treated for an infection  7. OTHER SYMPTOMS: Do you have any other symptoms? (e.g., fever, itching, vaginal bleeding, pain with urination, injury to genital area, vaginal foreign body)     Crying out in pain  Protocols used: Vaginal Symptoms-A-AH

## 2024-05-15 ENCOUNTER — Non-Acute Institutional Stay: Payer: Self-pay | Admitting: Internal Medicine

## 2024-05-15 DIAGNOSIS — F03B18 Unspecified dementia, moderate, with other behavioral disturbance: Secondary | ICD-10-CM

## 2024-05-15 DIAGNOSIS — F03A0 Unspecified dementia, mild, without behavioral disturbance, psychotic disturbance, mood disturbance, and anxiety: Secondary | ICD-10-CM

## 2024-05-15 DIAGNOSIS — B3731 Acute candidiasis of vulva and vagina: Secondary | ICD-10-CM | POA: Diagnosis not present

## 2024-05-15 DIAGNOSIS — E1140 Type 2 diabetes mellitus with diabetic neuropathy, unspecified: Secondary | ICD-10-CM | POA: Diagnosis not present

## 2024-05-15 DIAGNOSIS — R102 Pelvic and perineal pain unspecified side: Secondary | ICD-10-CM

## 2024-05-15 DIAGNOSIS — Z794 Long term (current) use of insulin: Secondary | ICD-10-CM

## 2024-05-15 LAB — SURGICAL PATHOLOGY

## 2024-05-15 MED ORDER — DIVALPROEX SODIUM 125 MG PO DR TAB: 125.0000 mg | DELAYED_RELEASE_TABLET | Freq: Two times a day (BID) | ORAL | Status: DC

## 2024-05-15 MED ORDER — ALPRAZOLAM 0.5 MG PO TABS: 0.5000 mg | ORAL_TABLET | Freq: Three times a day (TID) | ORAL | 0 refills | Status: DC | PRN

## 2024-05-15 MED ORDER — OXYCODONE HCL 5 MG PO TABS: 5.0000 mg | ORAL_TABLET | Freq: Three times a day (TID) | ORAL | Status: AC | PRN

## 2024-05-15 NOTE — Progress Notes (Signed)
 Location: [facility] of Service:  SNF ([id number])  Provider:   Code Status: DNR  Goals of Care:     04/30/2024   11:34 AM  Advanced Directives  Does Patient Have a Medical Advance Directive? Yes  Type of Advance Directive Living will;Out of facility DNR (pink MOST or yellow form)  Does patient want to make changes to medical advance directive? No - Patient declined  Pre-existing out of facility DNR order (yellow form or pink MOST form) Yellow form placed in chart (order not valid for inpatient use);Pink MOST form placed in chart (order not valid for inpatient use)     Chief Complaint  Patient presents with   Acute Visit    HPI: Patient is a 83 y.o. 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 She saw the gynecology on 10/31 and speculum exam showed small blood clots and a green vaginal discharge.  They also performed a punch biopsy Patient is back on hormone boric acid suppositories.  With lidocaine  cream The gynecology noticed blood on exam and discussed with the husband who said that he does not want any surgery chemo or radiation and is looking more for comfort Patient continues to complain of pain and screams when the nurses are providing her care.  She also screams when they try to move her  Discussed with husband who wants patient to be more comfortable. Recently patient also has had a cognitive decline .  She repeats herself.  And is also struggling with word finding   Past Medical History:  Diagnosis Date   Allergy     Fleeta Smock; allergy shots weekly.     Anxiety    Arthritis    DDD lumbar spine.     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    Past Surgical History:  Procedure Laterality Date   BREAST LUMPECTOMY  1996   Left breast cancer   BREAST SURGERY N/A    [employer] 12/12/2019   EYE SURGERY N/A    [employer] 12/12/2019   MASTECTOMY  2002   Bilateral for breast cancer   VAGINAL HYSTERECTOMY  1985   DUB; uterine fibroids; ovaries intact.    Allergies  Allergen Reactions   Fruit & Vegetable Daily [Nutritional Supplements] Diarrhea   Mixed Ragweed    Onion Other (See Comments)    indigestion   Tree Extract Swelling   Sulfa Drugs Cross Reactors Rash    All over the body    Outpatient Encounter Medications as of 05/15/2024  Medication Sig   acetaminophen  (TYLENOL ) 500 MG tablet Take 1,000 mg by mouth every 12 (twelve) hours as needed.   albuterol  (VENTOLIN  HFA) 108 (90 Base) MCG/ACT inhaler Inhale 2 puffs  into the lungs every 6 (six) hours as needed for wheezing or shortness of breath.   antiseptic oral rinse (BIOTENE) LIQD 30 mLs by Mouth Rinse route 2 (two) times daily. For dry mouth   aspirin 81 MG chewable tablet Chew by mouth daily.   Boric Acid 600 MG SUPP Place 1 suppository vaginally once a week.   calcium  carbonate (TUMS - DOSED IN MG ELEMENTAL CALCIUM ) 500 MG chewable tablet Chew 1 tablet by mouth every 8 (eight) hours as needed for indigestion or heartburn (1-2 tablets).   clobetasol ointment (TEMOVATE) 0.05 % Apply 1 Application topically daily. Apply a pea-sized amount to the affected area once daily   Continuous Glucose Sensor (DEXCOM G7 SENSOR) MISC by Does not apply route. Inject 1 application subcutaneously  every day shift every 10 day(s) for T2DM- continuous blood glucose monitoring related to type 2 Diabetes mellitus with diabetic polyneuropathy (E11.42) Change sensor sites q 10 days. Document site where new sensor is placed.   Estradiol  (IMVEXXY  MAINTENANCE PACK) 4 MCG INST Place 1 suppository vaginally 2 (two) times a week.   fluticasone  (FLONASE ) 50 MCG/ACT nasal spray Place 1 spray into both nostrils as needed for allergies or rhinitis.   gabapentin  (NEURONTIN ) 100 MG capsule Take 200 mg by mouth at bedtime.   hydrocortisone  (ANUSOL -HC) 2.5 % rectal cream Place 1 Application rectally 2 (two) times daily.   hydrocortisone  (ANUSOL -HC) 25 MG suppository Place 25 mg rectally as needed for hemorrhoids or anal itching.   insulin aspart (NOVOLOG) 100 UNIT/ML injection Inject into the skin 2 (two) times daily. 8 units if CBG more then 200 12 units if CBG more then 300   insulin glargine (LANTUS) 100 UNIT/ML injection Inject 42 Units into the skin every morning.   lidocaine  (XYLOCAINE ) 5 % ointment Apply 1 Application topically as needed. Apply vaginally daily   lisinopril  (ZESTRIL ) 5 MG tablet Take 5 mg by mouth daily.   loperamide (IMODIUM A-D) 2 MG tablet Take 2 mg by mouth every 12 (twelve) hours as needed for diarrhea or loose stools.   loratadine (CLARITIN) 10 MG tablet Take 10 mg by mouth every morning.   Melatonin 5 MG CAPS Take 7.5 mg by mouth daily. As needed for insomnia   memantine (NAMENDA) 5 MG tablet Take 5 mg by mouth at bedtime.   metFORMIN  (GLUCOPHAGE ) 500 MG tablet Take 500 mg by mouth every morning.   metoprolol  succinate (TOPROL -XL) 100 MG 24 hr tablet Take 100 mg by mouth 2 (two) times daily. Take with or immediately following a meal.   NONFORMULARY OR COMPOUNDED ITEM Boric acid 600mg  vaginal suppositories. Use nightly and patient can use one PRN if requesting.   ondansetron  (ZOFRAN ) 4 MG tablet Take 1 tablet (4 mg total) by mouth every 8 (eight) hours as needed for nausea or  vomiting.   oxyCODONE (ROXICODONE) 5 MG immediate release tablet Take 1 tablet (5 mg total) by mouth 2 (two) times daily as needed for severe pain (pain score 7-10).   polyethylene glycol (MIRALAX / GLYCOLAX) 17 g packet Take 17 g by mouth as needed. Give 1 scoop by mouth every 24 hours as needed for constipation   pravastatin  (PRAVACHOL ) 20 MG tablet Take 20 mg by mouth every evening.   psyllium (METAMUCIL) 58.6 % packet Take 1 packet by mouth daily.   senna-docusate (SENOKOT-S) 8.6-50 MG tablet Take 1.5 tablets by mouth daily. For constipation   sertraline  (ZOLOFT ) 25 MG tablet Take 75 mg by mouth daily.   sitaGLIPtin  (  JANUVIA ) 50 MG tablet Take 1 tablet (50 mg total) by mouth daily.   Trospium  Chloride 60 MG CP24 Take 1 capsule (60 mg total) by mouth daily.   Vibegron 75 MG TABS Take 1 tablet (75 mg total) by mouth daily.   white petrolatum (VASELINE) OINT Apply 1 Application topically daily. Apply daily to affected vulvar area as an emollient   zinc oxide 20 % ointment Apply 1 application  topically as needed (To buttocks after every incontinent epsiode and as needed for redness).   No facility-administered encounter medications on file as of 05/15/2024.    Review of Systems:  Review of Systems  Constitutional:  Negative for activity change and appetite change.  HENT: Negative.    Respiratory:  Negative for cough and shortness of breath.   Cardiovascular:  Negative for leg swelling.  Gastrointestinal:  Negative for constipation.  Genitourinary:  Positive for pelvic pain and vaginal discharge.  Musculoskeletal:  Positive for gait problem. Negative for arthralgias and myalgias.  Skin: Negative.   Neurological:  Negative for dizziness and weakness.  Psychiatric/Behavioral:  Positive for confusion and dysphoric mood. Negative for sleep disturbance. The patient is nervous/anxious.     Health Maintenance  Topic Date Due   Diabetic kidney evaluation - Urine ACR  05/17/2019   HEMOGLOBIN  A1C  12/11/2023   COVID-19 Vaccine (9 - 2025-26 season) 03/10/2024   Diabetic kidney evaluation - eGFR measurement  05/02/2024   OPHTHALMOLOGY EXAM  04/15/2024   FOOT EXAM  05/13/2024   Medicare Annual Wellness (AWV)  02/28/2025   DTaP/Tdap/Td (3 - Td or Tdap) 07/06/2032   Pneumococcal Vaccine: 50+ Years  Completed   Influenza Vaccine  Completed   DEXA SCAN  Completed   Zoster Vaccines- Shingrix  Completed   Meningococcal B Vaccine  Aged Out    Physical Exam: There were no vitals filed for this visit. There is no height or weight on file to calculate BMI. Physical Exam Vitals reviewed.  Constitutional:      Appearance: Normal appearance.  HENT:     Head: Normocephalic.     Nose: Nose normal.     Mouth/Throat:     Mouth: Mucous membranes are moist.     Pharynx: Oropharynx is clear.  Eyes:     Pupils: Pupils are equal, round, and reactive to light.  Cardiovascular:     Rate and Rhythm: Normal rate and regular rhythm.     Pulses: Normal pulses.     Heart sounds: Normal heart sounds. No murmur heard. Pulmonary:     Effort: Pulmonary effort is normal.     Breath sounds: Normal breath sounds.  Abdominal:     General: Abdomen is flat. Bowel sounds are normal.     Palpations: Abdomen is soft.  Genitourinary:    Comments: On Superficial Vaginal exam in her Bed Redness seemed better No Discharge noticed Small areas on Vulva which looks were the Biopsy was done She also does have some redness in inside are of her Labia Not tender on Exam today  Musculoskeletal:        General: No swelling.     Cervical back: Neck supple.  Skin:    General: Skin is warm.  Neurological:     General: No focal deficit present.     Mental Status: She is alert.  Psychiatric:        Mood and Affect: Mood normal.        Thought Content: Thought content normal.  Labs reviewed: Basic Metabolic Panel: No results for input(s): NA, K, CL, CO2, GLUCOSE, BUN, CREATININE,  CALCIUM , MG, PHOS, TSH in the last 8760 hours. Liver Function Tests: No results for input(s): AST, ALT, ALKPHOS, BILITOT, PROT, ALBUMIN in the last 8760 hours. No results for input(s): LIPASE, AMYLASE in the last 8760 hours. No results for input(s): AMMONIA in the last 8760 hours. CBC: No results for input(s): WBC, NEUTROABS, HGB, HCT, MCV, PLT in the last 8760 hours. Lipid Panel: Recent Labs    06/12/23 0000  CHOL 113  HDL 38  LDLCALC 53  TRIG 135   Lab Results  Component Value Date   HGBA1C 7.4 06/12/2023    Procedures since last visit: No results found.  Assessment/Plan 1. Vaginal candidiasis (Primary) Candida Gilberta Boric Acid at bedtime Also oN Lidocaine  to help with Pain Also on Oxycodone PRn  2. Type 2 diabetes mellitus with diabetic neuropathy, with long-term current use of insulin (HCC) Her CBGS are running on lower side Will Discontinue Januvia  A1C in 08/25 was 7.9  3. Vaginal pain Oxycodone PRN On estrogen Supp 2/week 4. Moderate dementia with Behaviors  Some worsening noticed with Behaviors of screaming  Will Try Depakote 125 mg BID Also Xanax 0.5 mg Q 8 every day Prn    Discussed in detail with Husband Goals of care are Comfort for her   Labs/tests ordered:  * No order type specified * Next appt:  Visit date not found

## 2024-05-15 NOTE — Telephone Encounter (Signed)
 D/w The husband in detail

## 2024-05-16 ENCOUNTER — Encounter: Payer: Self-pay | Admitting: Internal Medicine

## 2024-05-16 ENCOUNTER — Telehealth: Payer: Self-pay | Admitting: Obstetrics and Gynecology

## 2024-05-16 DIAGNOSIS — N904 Leukoplakia of vulva: Secondary | ICD-10-CM

## 2024-05-16 MED ORDER — CLOBETASOL PROPIONATE E 0.05 % EX CREA: 1.0000 | TOPICAL_CREAM | Freq: Every evening | CUTANEOUS | 2 refills | Status: DC

## 2024-05-16 NOTE — Telephone Encounter (Signed)
 Called and spoke to patient's husband regarding diagnosis of Lichen Sclerosus. Patient will need to be on daily clobetasol cream to treat the inflammation. Marcey, patient's husband is hopeful this will give patient relief.   Clobetasol cream sent to pharmacy. Patient to have this applied to vaginal lips and externally as needed. Called and left a message for nursing staff at [facility] with a message. Will fax this note and a copy of the order as well.

## 2024-05-29 ENCOUNTER — Non-Acute Institutional Stay (SKILLED_NURSING_FACILITY): Payer: Self-pay | Admitting: Adult Health

## 2024-05-29 ENCOUNTER — Encounter: Payer: Self-pay | Admitting: Internal Medicine

## 2024-05-29 ENCOUNTER — Encounter: Payer: Self-pay | Admitting: Adult Health

## 2024-05-29 DIAGNOSIS — R102 Pelvic and perineal pain unspecified side: Secondary | ICD-10-CM | POA: Diagnosis not present

## 2024-05-29 DIAGNOSIS — E1140 Type 2 diabetes mellitus with diabetic neuropathy, unspecified: Secondary | ICD-10-CM

## 2024-05-29 DIAGNOSIS — F419 Anxiety disorder, unspecified: Secondary | ICD-10-CM

## 2024-05-29 DIAGNOSIS — Z794 Long term (current) use of insulin: Secondary | ICD-10-CM

## 2024-05-29 DIAGNOSIS — F39 Unspecified mood [affective] disorder: Secondary | ICD-10-CM

## 2024-05-29 MED ORDER — ALPRAZOLAM 0.5 MG PO TABS: 0.5000 mg | ORAL_TABLET | Freq: Three times a day (TID) | ORAL | 0 refills | Status: AC | PRN

## 2024-05-29 NOTE — Telephone Encounter (Signed)
 Patient was evaluated today and discussed medication adjustments with him.

## 2024-05-29 NOTE — Progress Notes (Signed)
 Location:  [facility] Room Number: [name] NRSG N26-A Place of Service:  SNF (31) Provider:  Medina-Vargas, Ahmia Colford, DNP, FNP-BC  Patient Care Team: [name] Fredia CROME, MD as PCP - General (Internal Medicine) [name] Charleston, MD (Ophthalmology) [name] Favorite, MD as Consulting Physician (General Surgery) [name] File, MD as Consulting Physician (Internal Medicine)  Extended Emergency Contact Information Primary Emergency Contact: [name],[name] Address: [address]          [place] [address] [place] Home Phone: [phone] Mobile Phone: [phone] Relation: Spouse  Code Status:  DNR  Goals of care: Advanced Directive information    05/29/2024    2:42 PM  Advanced Directives  Does Patient Have a Medical Advance Directive? Yes  Type of Advance Directive Out of facility DNR (pink MOST or yellow form)  Does patient want to make changes to medical advance directive? No - Patient declined     Chief Complaint  Patient presents with   Acute Visit    Anxiety    HPI:  Pt is a 83 y.o. female seen today for an acute visit regarding anxiety. She is a resident of [id number]. She was reported to have been screaming due to vaginal pain. Husband reported getting a call from her to intervene. She is currently on lidocaine  external ointment 5% to vagina topically daily as needed, lidocaine  external gel 2% apply to vagina topically every 8 hours as needed and oxycodone 5 mg every 8 hours as needed.   Husband atd reported t thehat patient's anger level has intensified for the past week. When her husband visits, she would say, you are not leaving me. Per her h sheusband, she say it iss this everytime he goes home, regardless of visit length. She was on Xanax PRN and currently on Depakote 125 mg BID for mood disorder.   She had a blood sugar level of 61 in the morning so Lantus 42 units was held. She was given orange juice and blood sugar  increased to 101. She also takes metformin  500 mg twice a day and NovoLog sliding scale with meals.     Past Medical History:  Diagnosis Date   Allergy    [name] [name]; allergy shots weekly.     Anxiety    Arthritis    DDD lumbar spine.     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   Past Surgical History:  Procedure Laterality Date   BREAST LUMPECTOMY  1996   Left breast cancer   BREAST SURGERY N/A    [employer] 12/12/2019   EYE SURGERY N/A    [employer] 12/12/2019   MASTECTOMY  2002   Bilateral for breast cancer   VAGINAL HYSTERECTOMY  1985   DUB; uterine fibroids; ovaries intact.    Allergies  Allergen Reactions   Fruit & Vegetable Daily [Nutritional Supplements] Diarrhea   Mixed Ragweed    Onion Other (See Comments)    indigestion   Tree Extract Swelling   Sulfa Drugs Cross Reactors Rash    All over the body    Outpatient Encounter Medications as of 05/29/2024  Medication Sig   acetaminophen  (TYLENOL ) 500 MG tablet Take  1,000 mg by mouth every 12 (twelve) hours as needed.   acetaminophen  (TYLENOL ) 500 MG tablet Take 1,000 mg by mouth every evening. Give 1000mg  by mouth in the evening for pain. Mild. Do not exceed over 3000 mg of acetaminophen  in 24 hour period   ALPRAZolam (XANAX) 0.5 MG tablet Take 1 tablet (0.5 mg total) by mouth every 8 (eight) hours as needed for anxiety.   antiseptic oral rinse (BIOTENE) LIQD 30 mLs by Mouth Rinse route 2 (two) times daily. For dry mouth   aspirin 81 MG chewable tablet Chew by mouth daily.   Boric Acid 600 MG SUPP Place 1 suppository vaginally once a week.   calcium  carbonate (TUMS - DOSED IN MG ELEMENTAL CALCIUM ) 500 MG chewable tablet Chew 1 tablet by mouth every 8 (eight)  hours as needed for indigestion or heartburn (1-2 tablets).   clobetasol ointment (TEMOVATE) 0.05 % Apply 1 Application topically daily. Apply a pea-sized amount to the affected area once daily   Clobetasol Prop Emollient Base (CLOBETASOL PROPIONATE E) 0.05 % emollient cream Apply 1 Application topically at bedtime. Can apply more frequency between changes of underwear if needed.   Continuous Glucose Sensor (DEXCOM G7 SENSOR) MISC by Does not apply route. 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 Document site where new sensor is placed.   divalproex (DEPAKOTE) 125 MG DR tablet Take 1 tablet (125 mg total) by mouth 2 (two) times daily.   Estradiol  (IMVEXXY  MAINTENANCE PACK) 4 MCG INST Place 1 suppository vaginally 2 (two) times a week.   fexofenadine-pseudoephedrine (ALLEGRA-D ALLERGY & CONGESTION) 180-240 MG 24 hr tablet Take 1 tablet by mouth at bedtime.   fluticasone  (FLONASE ) 50 MCG/ACT nasal spray Place 1 spray into both nostrils as needed for allergies or rhinitis.   gabapentin  (NEURONTIN ) 100 MG capsule Take 200 mg by mouth at bedtime.   hydrocortisone  (ANUSOL -HC) 2.5 % rectal cream Place 1 Application rectally 2 (two) times daily as needed.   hydrocortisone  (ANUSOL -HC) 25 MG suppository Place 25 mg rectally as needed for hemorrhoids or anal itching.   insulin aspart (NOVOLOG) 100 UNIT/ML injection Inject into the skin 2 (two) times daily. 8 units if CBG more then 200 12 units if CBG more then 300   insulin glargine (LANTUS) 100 UNIT/ML injection Inject 42 Units into the skin every morning.   lidocaine  (XYLOCAINE ) 5 % ointment Apply 1 Application topically as needed. Apply vaginally daily   lisinopril  (ZESTRIL ) 5 MG tablet Take 5 mg by mouth daily.   loperamide (IMODIUM A-D) 2 MG tablet Take 2 mg by mouth every 12  (twelve) hours as needed for diarrhea or loose stools.   Melatonin 5 MG CAPS Take 7.5 mg by mouth daily. As needed for insomnia   memantine (NAMENDA) 5 MG tablet Take 5 mg by mouth at bedtime.   metFORMIN  (GLUCOPHAGE ) 500 MG tablet Take 500 mg by mouth every morning.   metoprolol  succinate (TOPROL -XL) 100 MG 24 hr tablet Take 100 mg by mouth 2 (two) times daily. Take with or immediately following a meal.   NONFORMULARY OR COMPOUNDED ITEM Boric acid 600mg  vaginal suppositories. Use nightly and patient can use one PRN if requesting.   oxyCODONE (ROXICODONE) 5 MG immediate release tablet Take 1 tablet (5 mg total) by mouth every 8 (eight) hours as needed for severe pain (pain score 7-10).  polyethylene glycol (MIRALAX / GLYCOLAX) 17 g packet Take 17 g by mouth as needed. Give 1 scoop by mouth every 24 hours as needed for constipation   pravastatin  (PRAVACHOL ) 20 MG tablet Take 20 mg by mouth every evening.   psyllium (METAMUCIL) 58.6 % packet Take 1 packet by mouth daily.   senna-docusate (SENOKOT-S) 8.6-50 MG tablet Take 1.5 tablets by mouth daily. For constipation   sertraline  (ZOLOFT ) 25 MG tablet Take 75 mg by mouth daily.   Trospium  Chloride 60 MG CP24 Take 1 capsule (60 mg total) by mouth daily.   Vibegron  75 MG TABS Take 1 tablet (75 mg total) by mouth daily.   zinc oxide 20 % ointment Apply 1 application  topically as needed (To buttocks after every incontinent epsiode and as needed for redness).   albuterol  (VENTOLIN  HFA) 108 (90 Base) MCG/ACT inhaler Inhale 2 puffs into the lungs every 6 (six) hours as needed for wheezing or shortness of breath. (Patient not taking: Reported on 05/29/2024)   loratadine (CLARITIN) 10 MG tablet Take 10 mg by mouth every morning. (Patient not taking: Reported on 05/29/2024)   ondansetron  (ZOFRAN ) 4 MG tablet Take 1 tablet (4 mg total) by mouth every 8 (eight) hours as needed for nausea or vomiting. (Patient not taking: Reported on 05/29/2024)   [DISCONTINUED]  ALPRAZolam  (XANAX ) 0.5 MG tablet Take 1 tablet (0.5 mg total) by mouth every 8 (eight) hours as needed for anxiety.   No facility-administered encounter medications on [name] as of 05/29/2024.    Review of Systems  Constitutional:  Negative for appetite change, chills, fatigue and fever.  HENT:  Negative for congestion, hearing loss, rhinorrhea and sore throat.   Eyes: Negative.   Respiratory:  Negative for cough, shortness of breath and wheezing.   Cardiovascular:  Negative for chest pain, palpitations and leg swelling.  Gastrointestinal:  Negative for abdominal pain, constipation, diarrhea, nausea and vomiting.  Genitourinary:  Negative for dysuria and vaginal bleeding.       Vaginal pain  Musculoskeletal:  Negative for arthralgias, back pain and myalgias.  Skin:  Negative for color change, rash and wound.  Neurological:  Negative for dizziness, weakness and headaches.  Psychiatric/Behavioral:  Negative for behavioral problems. The patient is not nervous/anxious.        Immunization History  Administered Date(s) Administered   Fluad Quad(high Dose 65+) 05/03/2022, 04/15/2024   INFLUENZA, HIGH DOSE SEASONAL PF 04/09/2014, 03/22/2015, 03/24/2016, 03/29/2018, 04/16/2020, 05/09/2023   Influenza Split 03/19/2012, 03/19/2013   Influenza,inj,Quad PF,6+ Mos 04/09/2014, 03/22/2015, 03/24/2016   Influenza-Unspecified 03/19/2012, 03/19/2013, 04/09/2017, 03/24/2018, 04/16/2020   Moderna Covid-19 Vaccine Bivalent Booster 60yrs & up 05/16/2023   Moderna SARS-COV2 Booster Vaccination 05/16/2022   Moderna Sars-Covid-2 Vaccination 05/19/2021   PFIZER(Purple Top)SARS-COV-2 Vaccination 08/17/2019, 09/10/2019, 04/06/2020, 11/19/2020   Pfizer Covid-19 Vaccine Bivalent Booster 86yrs & up 11/18/2020, 12/21/2021   Pneumococcal Conjugate-13 11/05/2014   Pneumococcal Polysaccharide-23 07/10/2004, 12/12/2010, 05/02/2016   Pneumococcal-Unspecified 07/10/2004, 12/12/2010, 05/02/2016   Respiratory Syncytial  Virus Vaccine,Recomb Aduvanted(Arexvy) 06/24/2022   Td 07/10/2009   Tdap 07/06/2022   Zoster Recombinant(Shingrix) 04/09/2017, 07/19/2017   Zoster, Live 07/10/2006   Pertinent  Health Maintenance Due  Topic Date Due   HEMOGLOBIN A1C  12/11/2023   OPHTHALMOLOGY EXAM  04/15/2024   FOOT EXAM  05/13/2024   Influenza Vaccine  Completed   Bone Density Scan  Completed      04/18/2023    2:05 PM 05/14/2023   12:34 PM 07/19/2023   10:42 AM 02/29/2024   12:20  PM 04/28/2024    2:01 PM  Fall Risk  Falls in the past year? 0 0 0 0   Was there an injury with Fall? 0 0 0 0   Fall Risk Category Calculator 0 0 0 0   Patient at Risk for Falls Due to History of fall(s);Impaired balance/gait;Impaired mobility History of fall(s);Impaired balance/gait;Impaired mobility History of fall(s);Impaired balance/gait;Impaired mobility History of fall(s);Impaired mobility Impaired balance/gait;Impaired mobility  Fall risk Follow up  Falls evaluation completed;Education provided;Falls prevention discussed Falls evaluation completed;Education provided;Falls prevention discussed Falls evaluation completed;Education provided Falls evaluation completed     Vitals:   05/29/24 1414  BP: 130/80  Pulse: 74  Resp: 18  Temp: 97.7 F (36.5 C)  SpO2: 96%  Weight: 175 lb 9.6 oz (79.7 kg)  Height: 5' 4 (1.626 m)   Body mass index is 30.14 kg/m.  Physical Exam Constitutional:      Appearance: Normal appearance.  HENT:     Head: Normocephalic and atraumatic.     Nose: Nose normal.     Mouth/Throat:     Mouth: Mucous membranes are moist.  Eyes:     Conjunctiva/sclera: Conjunctivae normal.  Cardiovascular:     Rate and Rhythm: Normal rate and regular rhythm.  Pulmonary:     Effort: Pulmonary effort is normal.     Breath sounds: Normal breath sounds.  Abdominal:     General: Bowel sounds are normal.     Palpations: Abdomen is soft.  Musculoskeletal:        General: Normal range of motion.     Cervical back:  Normal range of motion.  Skin:    General: Skin is warm and dry.  Neurological:     Mental Status: She is alert.  Psychiatric:        Mood and Affect: Mood normal.        Behavior: Behavior normal.      Labs reviewed: No results for input(s): NA, K, CL, CO2, GLUCOSE, BUN, CREATININE, CALCIUM , MG, PHOS in the last 8760 hours. No results for input(s): AST, ALT, ALKPHOS, BILITOT, PROT, ALBUMIN in the last 8760 hours. No results for input(s): WBC, NEUTROABS, HGB, HCT, MCV, PLT in the last 8760 hours. Lab Results  Component Value Date   TSH 3.100 08/12/2019   Lab Results  Component Value Date   HGBA1C 7.4 06/12/2023   Lab Results  Component Value Date   CHOL 113 06/12/2023   HDL 38 06/12/2023   LDLCALC 53 06/12/2023   TRIG 135 06/12/2023   CHOLHDL 4.4 12/19/2018    Significant Diagnostic Results in last 30 days:  No results found.  Assessment/Plan  1. Type 2 diabetes mellitus with diabetic neuropathy, with long-term current use of insulin (HCC) (Primary) Lab Results  Component Value Date   HGBA1C 7.4 06/12/2023    -Blood sugar dropped to 61, was given orange juice and blood sugar increased to 101 -   Will decrease Lantus from 42 units to 32 units SQ daily -   Continue metformin  500 mg twice a day - Continue NovoLog sliding scale with meals  2. Anxiety -   Will start Xanax 0.5 mg 1 tab every 8 hours PRN -  monitor behavior - ALPRAZolam (XANAX) 0.5 MG tablet; Take 1 tablet (0.5 mg total) by mouth every 8 (eight) hours as needed for anxiety.  Dispense: 30 tablet; Refill: 0  3. Mood disorder -   Will increase Depakote sprinkle 125 mg twice a day to Depakote sprinkle 125 mg 2  capsules = 250 mg twice a day -   Monitor behavior  4. Vaginal pain -  continue lidocaine  external ointment 5% to vagina topically daily as needed, lidocaine  external gel 2% apply to vagina topically every 8 hours as needed and oxycodone 5 mg every 8  hours as needed.      Family/ staff Communication: Discussed plan of care with resident and charge nurse  Labs/tests ordered:  None    Marshell Dilauro Medina-Vargas, DNP, MSN, FNP-BC [facility] [phone] (Monday-Friday 8:00 a.m. - 5:00 p.m.) [phone] (after hours)

## 2024-06-26 ENCOUNTER — Other Ambulatory Visit (HOSPITAL_COMMUNITY)
Admission: RE | Admit: 2024-06-26 | Discharge: 2024-06-26 | Disposition: A | Source: Ambulatory Visit | Attending: Obstetrics and Gynecology | Admitting: Obstetrics and Gynecology

## 2024-06-26 ENCOUNTER — Ambulatory Visit: Admitting: Obstetrics and Gynecology

## 2024-06-26 VITALS — BP 128/76 | HR 81

## 2024-06-26 DIAGNOSIS — R3129 Other microscopic hematuria: Secondary | ICD-10-CM

## 2024-06-26 DIAGNOSIS — Z87440 Personal history of urinary (tract) infections: Secondary | ICD-10-CM

## 2024-06-26 DIAGNOSIS — N898 Other specified noninflammatory disorders of vagina: Secondary | ICD-10-CM | POA: Insufficient documentation

## 2024-06-26 DIAGNOSIS — N3000 Acute cystitis without hematuria: Secondary | ICD-10-CM

## 2024-06-26 DIAGNOSIS — R300 Dysuria: Secondary | ICD-10-CM

## 2024-06-26 DIAGNOSIS — R350 Frequency of micturition: Secondary | ICD-10-CM

## 2024-06-26 LAB — POCT URINALYSIS DIP (CLINITEK)
Bilirubin, UA: NEGATIVE
Glucose, UA: NEGATIVE mg/dL
Ketones, POC UA: NEGATIVE mg/dL
Nitrite, UA: POSITIVE — AB
POC PROTEIN,UA: 30 — AB
Spec Grav, UA: 1.015 (ref 1.010–1.025)
Urobilinogen, UA: 0.2 U/dL
pH, UA: 5.5 (ref 5.0–8.0)

## 2024-06-26 MED ORDER — LIDOCAINE HCL URETHRAL/MUCOSAL 2 % EX GEL
1.0000 | Freq: Once | CUTANEOUS | Status: AC
  Administered 2024-06-26: 15:00:00 1 via URETHRAL

## 2024-06-26 MED ORDER — CIPROFLOXACIN HCL 500 MG PO TABS
500.0000 mg | ORAL_TABLET | Freq: Once | ORAL | Status: AC
  Administered 2024-06-26: 15:00:00 500 mg via ORAL

## 2024-06-26 MED ORDER — FOSFOMYCIN TROMETHAMINE 3 G PO PACK: 3.0000 g | PACK | Freq: Once | ORAL | 0 refills | Status: AC

## 2024-06-26 NOTE — Progress Notes (Unsigned)
 CYSTOSCOPY  CC:  This is a 83 y.o. with microscopic hematuria and recurrent UTI who presents today for cystoscopy.  Results for orders placed or performed in visit on 05/16/24  SureSwab Advanced Vaginitis Plus,TMA   Collection Time: 05/01/24 12:00 AM  Result Value Ref Range   Chlamydia, Swab/Urine, PCR Negative      BP 128/76   Pulse 81   CYSTOSCOPY: A time out was performed.  The periurethral area was prepped and draped in a sterile manner. An aptima swab was obtained due to yeast plaques on the labia majora. A half speculum was placed to visualize the urethra. Lidocaine  jelly was applied. A small caruncle was noted and this bled with touch- straight catheter was placed to drain the bladder, approximately 250cc.  The urethra and bladder were then visualized with flexible cystoscope.  She had normal urethral coaptation and normal urethral mucosa.  White debris was noted throughout the bladder which inhibited some visualization. She had normal bladder mucosa. Ureteral orifices visualized but unable to visualize efflux due to debris. Scope was retroflexed.  She had no squamous metaplasia at the trigone, no trabeculations, cellules or diverticuli.  At this point, the patient could not tolerate the cystoscopy any longer so the scope was removed. Lidocaine  jelly was placed on the vulva.   ASSESSMENT:  83 y.o. with microscopic hematuria and recurrent UTI. Cystoscopy today is normal.  PLAN:  - Due to suspected UTI, a dose of ciprofloxacin  was given and she was also prescribed a dose of fosfomycin . Urine was sent for pathnostics culture to confirm susceptibility.  - Aptima swab sent for suspected candida. If glabrata is confirmed, plan to message ID for assistance with treatment.   Follow up in a month  Rosaline LOISE Caper, MD

## 2024-06-26 NOTE — Patient Instructions (Signed)
 Taking Care of Yourself after Urodynamics, Cystoscopy, Bulkamid Injection, or Botox Injection   Drink plenty of water for a day or two following your procedure. Try to have about 8 ounces (one cup) at a time, and do this 6 times or more per day unless you have fluid restrictitons AVOID irritative beverages such as coffee, tea, soda, alcoholic or citrus drinks for a day or two, as this may cause burning with urination.  For the first 1-2 days after the procedure, your urine may be pink or red in color. You may have some blood in your urine as a normal side effect of the procedure. Large amounts of bleeding or difficulty urinating are NOT normal. Call the nurse line if this happens or go to the nearest Emergency Room if the bleeding is heavy or you cannot urinate at all and it is after hours.  You may return to normal daily activities such as work, school, driving, exercising and housework on the day of the procedure. If your doctor gave you a prescription, take it as ordered.

## 2024-06-27 ENCOUNTER — Ambulatory Visit: Payer: Self-pay | Admitting: Obstetrics and Gynecology

## 2024-06-27 LAB — CERVICOVAGINAL ANCILLARY ONLY
Bacterial Vaginitis (gardnerella): NEGATIVE
Candida Glabrata: NEGATIVE
Candida Vaginitis: NEGATIVE
Comment: NEGATIVE
Comment: NEGATIVE
Comment: NEGATIVE

## 2024-07-17 ENCOUNTER — Non-Acute Institutional Stay (SKILLED_NURSING_FACILITY): Payer: Self-pay | Admitting: Internal Medicine

## 2024-07-17 DIAGNOSIS — F339 Major depressive disorder, recurrent, unspecified: Secondary | ICD-10-CM | POA: Diagnosis not present

## 2024-07-17 DIAGNOSIS — N3281 Overactive bladder: Secondary | ICD-10-CM | POA: Diagnosis not present

## 2024-07-17 DIAGNOSIS — E785 Hyperlipidemia, unspecified: Secondary | ICD-10-CM

## 2024-07-17 DIAGNOSIS — E1140 Type 2 diabetes mellitus with diabetic neuropathy, unspecified: Secondary | ICD-10-CM | POA: Diagnosis not present

## 2024-07-17 DIAGNOSIS — F0390 Unspecified dementia without behavioral disturbance: Secondary | ICD-10-CM

## 2024-07-17 DIAGNOSIS — B3731 Acute candidiasis of vulva and vagina: Secondary | ICD-10-CM

## 2024-07-17 DIAGNOSIS — N1832 Chronic kidney disease, stage 3b: Secondary | ICD-10-CM | POA: Diagnosis not present

## 2024-07-17 DIAGNOSIS — Z794 Long term (current) use of insulin: Secondary | ICD-10-CM

## 2024-07-17 DIAGNOSIS — I10 Essential (primary) hypertension: Secondary | ICD-10-CM

## 2024-07-17 NOTE — Progress Notes (Signed)
 "  Location:  [facility] Service:  SNF ([id number])  Provider:   Code Status: DNR Goals of Care:     05/29/2024    2:42 PM  Advanced Directives  Does Patient Have a Medical Advance Directive? Yes  Type of Advance Directive Out of facility DNR (pink MOST or yellow form)  Does patient want to make changes to medical advance directive? No - Patient declined     Chief Complaint  Patient presents with   Care Management    HPI: Patient is a 84 y.o. female seen today for medical management of chronic diseases.    Lives in SNF in [facility]   Acute issues  Diabetes mellitus Patient is doing well on insulin most of her blood sugars are around 100 220 No hypoglycemic episodes.   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  MRI at that time showed moderate atrophy with chronic ischemia Due to her behaviors and hallucinations she was started on Depakote  Her behavior seems to be more controlled now. She is also on Namenda but would not let us  increase the dose more than once a day.  Wt Readings from Last 3 Encounters:  07/17/24 164 lb (74.4 kg)  05/29/24 175 lb 9.6 oz (79.7 kg)  04/30/24 175 lb 9.6 oz (79.7 kg)  She continues to need help with transfers with to assist.  Is able to walk with 1 assist with therapist.  She continues to be high risk for falls because of her cognition.  Past Medical History:  Diagnosis Date   Allergy    Fleeta Smock; allergy shots weekly.     Anxiety    Arthritis    DDD lumbar spine.     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    Past Surgical History:  Procedure Laterality Date   BREAST LUMPECTOMY  1996   Left breast cancer   BREAST SURGERY N/A    [employer] 12/12/2019   EYE SURGERY N/A    [employer] 12/12/2019   MASTECTOMY  2002   Bilateral for breast cancer   VAGINAL HYSTERECTOMY  1985   DUB; uterine fibroids; ovaries intact.    Allergies[1]  Outpatient Encounter Medications as of 07/17/2024  Medication Sig   acetaminophen  (TYLENOL ) 500 MG tablet Take 1,000 mg by mouth every 12 (twelve) hours as needed.   acetaminophen  (TYLENOL ) 500 MG tablet Take 1,000 mg by mouth every evening. Give 1000mg  by mouth in the evening for pain. Mild. Do not exceed over 3000 mg of acetaminophen  in 24 hour period   albuterol  (VENTOLIN  HFA) 108 (90 Base) MCG/ACT inhaler Inhale 2 puffs into the lungs every 6 (six) hours as needed for wheezing or shortness of breath.   ALPRAZolam  (XANAX ) 0.5 MG tablet Take 1 tablet (0.5 mg total) by mouth every 8 (eight) hours as needed for anxiety.   antiseptic oral rinse (BIOTENE) LIQD 30 mLs by Mouth Rinse route 2 (two) times daily. For dry mouth   aspirin 81  MG chewable tablet Chew by mouth daily.   Boric Acid 600 MG SUPP Place 1 suppository vaginally at bedtime.   calcium  carbonate (TUMS - DOSED IN MG ELEMENTAL CALCIUM ) 500 MG chewable tablet Chew 1 tablet by mouth every 8 (eight) hours as needed for indigestion or heartburn (1-2 tablets).   clobetasol  ointment (TEMOVATE ) 0.05 % Apply 1 Application topically daily. Apply a pea-sized amount to the affected area once daily   Clobetasol  Prop Emollient Base (CLOBETASOL  PROPIONATE E) 0.05 % emollient cream Apply 1 Application topically at bedtime. Can apply more frequency between changes of underwear if needed.   Continuous Glucose Sensor (DEXCOM G7 SENSOR) MISC by Does not apply route. Inject 1 application subcutaneously every day shift every 10 day(s) for T2DM- continuous blood glucose monitoring related to  type 2 Diabetes mellitus with diabetic polyneuropathy (E11.42) Change sensor sites q 10 days. Document site where new sensor is placed.   divalproex  (DEPAKOTE ) 125 MG DR tablet Take 1 tablet (125 mg total) by mouth 2 (two) times daily.   Estradiol  (IMVEXXY  MAINTENANCE PACK) 4 MCG INST Place 1 suppository vaginally 2 (two) times a week.   fexofenadine-pseudoephedrine (ALLEGRA-D ALLERGY & CONGESTION) 180-240 MG 24 hr tablet Take 1 tablet by mouth at bedtime.   fluticasone  (FLONASE ) 50 MCG/ACT nasal spray Place 1 spray into both nostrils as needed for allergies or rhinitis.   gabapentin  (NEURONTIN ) 100 MG capsule Take 200 mg by mouth at bedtime.   hydrocortisone  (ANUSOL -HC) 2.5 % rectal cream Place 1 Application rectally 2 (two) times daily as needed.   hydrocortisone  (ANUSOL -HC) 25 MG suppository Place 25 mg rectally as needed for hemorrhoids or anal itching.   insulin aspart (NOVOLOG) 100 UNIT/ML injection Inject into the skin 2 (two) times daily. 8 units if CBG more then 200 12 units if CBG more then 300   insulin glargine (LANTUS) 100 UNIT/ML injection Inject 32 Units into the skin every morning.   lidocaine  (XYLOCAINE ) 5 % ointment Apply 1 Application topically as needed. Apply vaginally daily   lisinopril  (ZESTRIL ) 5 MG tablet Take 5 mg by mouth daily.   loperamide (IMODIUM A-D) 2 MG tablet Take 2 mg by mouth every 12 (twelve) hours as needed for diarrhea or loose stools.   loratadine (CLARITIN) 10 MG tablet Take 10 mg by mouth every morning. (Patient not taking: Reported on 06/26/2024)   Melatonin 5 MG CAPS Take 7.5 mg by mouth daily. As needed for insomnia   memantine (NAMENDA) 5 MG tablet Take 5 mg by mouth at bedtime.   metFORMIN  (GLUCOPHAGE ) 500 MG tablet Take 500 mg by mouth every morning.   metoprolol  succinate (TOPROL -XL) 100 MG 24 hr tablet Take 100 mg by mouth 2 (two) times daily. Take with or immediately following a meal.   NONFORMULARY OR COMPOUNDED ITEM Boric acid 600mg  vaginal  suppositories. Use nightly and patient can use one PRN if requesting.   ondansetron  (ZOFRAN ) 4 MG tablet Take 1 tablet (4 mg total) by mouth every 8 (eight) hours as needed for nausea or vomiting. (Patient not taking: Reported on 06/26/2024)   oxyCODONE  (ROXICODONE ) 5 MG immediate release tablet Take 1 tablet (5 mg total) by mouth every 8 (eight) hours as needed for severe pain (pain score 7-10).   polyethylene glycol (MIRALAX / GLYCOLAX) 17 g packet Take 17 g by mouth as needed. Give 1 scoop by mouth every 24 hours as needed for constipation   pravastatin  (PRAVACHOL ) 20 MG tablet Take 20 mg by mouth every evening.  psyllium (METAMUCIL) 58.6 % packet Take 1 packet by mouth daily.   senna-docusate (SENOKOT-S) 8.6-50 MG tablet Take 1.5 tablets by mouth daily. For constipation   sertraline  (ZOLOFT ) 25 MG tablet Take 75 mg by mouth daily.   Trospium  Chloride 60 MG CP24 Take 1 capsule (60 mg total) by mouth daily.   Vibegron  75 MG TABS Take 1 tablet (75 mg total) by mouth daily.   zinc oxide 20 % ointment Apply 1 application  topically as needed (To buttocks after every incontinent epsiode and as needed for redness).   No facility-administered encounter medications on file as of 07/17/2024.    Review of Systems:  Review of Systems  Constitutional:  Negative for activity change and appetite change.  HENT: Negative.    Respiratory:  Negative for cough and shortness of breath.   Cardiovascular:  Negative for leg swelling.  Gastrointestinal:  Negative for constipation.  Genitourinary: Negative.   Musculoskeletal:  Positive for gait problem. Negative for arthralgias and myalgias.  Skin: Negative.   Neurological:  Negative for dizziness and weakness.  Psychiatric/Behavioral:  Positive for confusion. Negative for dysphoric mood and sleep disturbance.     Health Maintenance  Topic Date Due   Diabetic kidney evaluation - Urine ACR  05/17/2019   HEMOGLOBIN A1C  12/11/2023   COVID-19 Vaccine (9 -  2025-26 season) 03/10/2024   OPHTHALMOLOGY EXAM  04/15/2024   Diabetic kidney evaluation - eGFR measurement  05/02/2024   FOOT EXAM  05/13/2024   Medicare Annual Wellness (AWV)  02/28/2025   DTaP/Tdap/Td (3 - Td or Tdap) 07/06/2032   Pneumococcal Vaccine: 50+ Years  Completed   Influenza Vaccine  Completed   Bone Density Scan  Completed   Zoster Vaccines- Shingrix  Completed   Meningococcal B Vaccine  Aged Out    Physical Exam: Vitals:   07/17/24 0839  BP: (!) 148/78  Pulse: 70  Resp: (!) 21  Temp: 98 F (36.7 C)  Weight: 164 lb (74.4 kg)   Body mass index is 28.15 kg/m. Physical Exam Vitals reviewed.  Constitutional:      Appearance: Normal appearance.  HENT:     Head: Normocephalic.     Nose: Nose normal.     Mouth/Throat:     Mouth: Mucous membranes are moist.     Pharynx: Oropharynx is clear.  Eyes:     Pupils: Pupils are equal, round, and reactive to light.  Cardiovascular:     Rate and Rhythm: Normal rate and regular rhythm.     Pulses: Normal pulses.     Heart sounds: Normal heart sounds. No murmur heard. Pulmonary:     Effort: Pulmonary effort is normal.     Breath sounds: Normal breath sounds.  Abdominal:     General: Abdomen is flat. Bowel sounds are normal.     Palpations: Abdomen is soft.  Musculoskeletal:        General: No swelling.     Cervical back: Neck supple.  Skin:    General: Skin is warm.  Neurological:     General: No focal deficit present.     Mental Status: She is alert.  Psychiatric:        Mood and Affect: Mood normal.        Thought Content: Thought content normal.     Labs reviewed: Basic Metabolic Panel: No results for input(s): NA, K, CL, CO2, GLUCOSE, BUN, CREATININE, CALCIUM , MG, PHOS, TSH in the last 8760 hours. Liver Function Tests: No results for input(s): AST,  ALT, ALKPHOS, BILITOT, PROT, ALBUMIN in the last 8760 hours. No results for input(s): LIPASE, AMYLASE in the last 8760  hours. No results for input(s): AMMONIA in the last 8760 hours. CBC: No results for input(s): WBC, NEUTROABS, HGB, HCT, MCV, PLT in the last 8760 hours. Lipid Panel: No results for input(s): CHOL, HDL, LDLCALC, TRIG, CHOLHDL, LDLDIRECT in the last 8760 hours. Lab Results  Component Value Date   HGBA1C 7.4 06/12/2023    Procedures since last visit: No results found.  Assessment/Plan 1. Type 2 diabetes mellitus with diabetic neuropathy, with long-term current use of insulin (HCC) (Primary) CBGs are doing well Patient is on Lantus and low-dose of metformin  Repeat A1c  2. Vaginal candidiasis Follows with GYN on boric acid daily also on Imvexxy   3. Primary hypertension Low dose of lisinopril   4. 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 Overactive bladder On Gemtesa  and trospium   6. Stage 3b chronic kidney disease (HCC) Repeat labs  7. Major depression, recurrent, chronic On Zoloft   8. Dyslipidemia On statin repeat lipid panel  Peripheral polyneuropathy on gabapentin   Labs/tests ordered:  * No order type specified * Next appt:  Visit date not found           [1]  Allergies Allergen Reactions   Fruit & Vegetable Daily [Nutritional Supplements] Diarrhea   Mixed Ragweed    Onion Other (See Comments)    indigestion   Tree Extract Swelling   Sulfa Drugs Cross Reactors Rash    All over the body   "

## 2024-07-18 ENCOUNTER — Encounter: Payer: Self-pay | Admitting: Internal Medicine

## 2024-07-18 MED ORDER — DIVALPROEX SODIUM 125 MG PO DR TAB: 250.0000 mg | DELAYED_RELEASE_TABLET | Freq: Two times a day (BID) | ORAL | Status: AC

## 2024-07-21 ENCOUNTER — Encounter: Payer: Self-pay | Admitting: *Deleted

## 2024-08-01 ENCOUNTER — Encounter: Payer: Self-pay | Admitting: Obstetrics and Gynecology

## 2024-08-01 ENCOUNTER — Ambulatory Visit: Admitting: Obstetrics and Gynecology

## 2024-08-01 VITALS — BP 129/80 | HR 63

## 2024-08-01 DIAGNOSIS — B3731 Acute candidiasis of vulva and vagina: Secondary | ICD-10-CM

## 2024-08-01 DIAGNOSIS — N904 Leukoplakia of vulva: Secondary | ICD-10-CM

## 2024-08-01 DIAGNOSIS — N3281 Overactive bladder: Secondary | ICD-10-CM

## 2024-08-01 MED ORDER — CLOBETASOL PROP EMOLLIENT BASE 0.05 % EX CREA: 1.0000 | TOPICAL_CREAM | CUTANEOUS | 11 refills | Status: AC

## 2024-08-01 MED ORDER — BORIC ACID 600 MG VA SUPP: 1.0000 | VAGINAL | 3 refills | Status: AC

## 2024-08-01 NOTE — Patient Instructions (Signed)

## 2024-08-01 NOTE — Progress Notes (Signed)
 [facility] Return Visit  SUBJECTIVE  History of Present Illness: [name] [name] is a 84 y.o. female seen in follow-up for OAB, recurrent Uti, candida glabrata infections, and lichen sclerosus.   Last treated for E.Coli UTI on 06/26/24. Husband reports that she has been doing well the last 2-3 weeks.   Using boric acid suppositories nightly, and imvexxy  twice a week. She is also applying the clobetasol  daily. Last visit with the cysto, vulva was examined and noted improvement. Husband reports she has not been complaining of any irritation symptoms.   She is on gemtesa  and trospium  for OAB symptoms. She is not sure if this is helping. She is drinking several small bottles of diet coke per day. Not drinking a lot of water.   Past Medical History: Patient  has a past medical history of Allergy, Anxiety, Arthritis, Cancer (HCC), Clotting disorder, Diabetes mellitus, Diabetes mellitus without complication (HCC), Hypertension, and IBS (irritable bowel syndrome).   Past Surgical History: She  has a past surgical history that includes Mastectomy (2002); Breast lumpectomy (1996); Vaginal hysterectomy (1985); Breast surgery (N/A); and Eye surgery (N/A).   Medications: She has a current medication list which includes the following prescription(s): acetaminophen , acetaminophen , albuterol , alprazolam , antiseptic oral rinse, aspirin, calcium  carbonate, clobetasol  ointment, dexcom g7 sensor, divalproex , imvexxy  maintenance pack, fexofenadine-pseudoephedrine, fluticasone , gabapentin , hydrocortisone , hydrocortisone , insulin aspart, insulin glargine, lidocaine , lisinopril , loperamide, melatonin, memantine, metformin , metoprolol  succinate, NONFORMULARY OR COMPOUNDED ITEM, oxycodone , polyethylene glycol, pravastatin , psyllium, senna-docusate, sertraline , trospium  chloride, vibegron , zinc oxide, boric acid, [START ON 08/04/2024] clobetasol  prop emollient base, loratadine, and ondansetron .    Allergies: Patient is allergic to fruit & vegetable daily [nutritional supplements], mixed ragweed, onion, tree extract, and sulfa drugs cross reactors.   Social History: Patient  reports that she has never smoked. She has never used smokeless tobacco. She reports that she does not drink alcohol and does not use drugs.     OBJECTIVE     Physical Exam: Vitals:   08/01/24 1015  BP: 129/80  Pulse: 63   Gen: No apparent distress, A&O x 3.  Detailed Urogynecologic Evaluation:  Deferred.    ASSESSMENT AND PLAN    [name] [name] is a 84 y.o. with:  1. Overactive bladder   2. Vaginal candida   3. Lichen sclerosus of female genitalia     - Will decrease clobetasol  to twice a week - Decrease boric acid suppositories once a week - Continue with imvexxy  twice weekly - Continue with Gemtesa  and trospium  for OAB. Husband does not feel need for more invasive interventions. Discussed drinking more water and eliminating caffeine/ soda.  - She can return for any UTI symptoms to have urine tested.   Return 6 months or sooner if needed  [name] LOISE Caper, MD  Time spent: I spent 25 minutes dedicated to the care of this patient on the date of this encounter to include pre-visit review of records, face-to-face time with the patient and post visit documentation.
